# Patient Record
Sex: Female | Born: 1935 | Race: White | Hispanic: No | Smoking: Never smoker
Health system: Southern US, Community
[De-identification: ages and names within clinical notes are randomized; demographics above are authoritative.]

## PROBLEM LIST (undated history)

## (undated) DIAGNOSIS — M1990 Unspecified osteoarthritis, unspecified site: Secondary | ICD-10-CM

## (undated) DIAGNOSIS — I2510 Atherosclerotic heart disease of native coronary artery without angina pectoris: Secondary | ICD-10-CM

## (undated) DIAGNOSIS — I10 Essential (primary) hypertension: Secondary | ICD-10-CM

## (undated) DIAGNOSIS — I83009 Varicose veins of unspecified lower extremity with ulcer of unspecified site: Secondary | ICD-10-CM

## (undated) DIAGNOSIS — I4891 Unspecified atrial fibrillation: Secondary | ICD-10-CM

## (undated) DIAGNOSIS — L97909 Non-pressure chronic ulcer of unspecified part of unspecified lower leg with unspecified severity: Secondary | ICD-10-CM

## (undated) DIAGNOSIS — I5032 Chronic diastolic (congestive) heart failure: Secondary | ICD-10-CM

## (undated) DIAGNOSIS — E785 Hyperlipidemia, unspecified: Secondary | ICD-10-CM

## (undated) DIAGNOSIS — M8580 Other specified disorders of bone density and structure, unspecified site: Secondary | ICD-10-CM

## (undated) DIAGNOSIS — D696 Thrombocytopenia, unspecified: Secondary | ICD-10-CM

## (undated) HISTORY — DX: Chronic diastolic (congestive) heart failure: I50.32

## (undated) HISTORY — DX: Atherosclerotic heart disease of native coronary artery without angina pectoris: I25.10

## (undated) HISTORY — PX: EYE SURGERY: SHX253

## (undated) HISTORY — PX: HIP SURGERY: SHX245

## (undated) HISTORY — DX: Unspecified atrial fibrillation: I48.91

## (undated) HISTORY — PX: BACK SURGERY: SHX140

## (undated) HISTORY — PX: JOINT REPLACEMENT: SHX530

## (undated) HISTORY — PX: INSERTION / PLACEMENT / REVISION NEUROSTIMULATOR: SUR720

---

## 2003-04-12 ENCOUNTER — Encounter: Payer: Self-pay | Admitting: Geriatric Medicine

## 2003-04-12 ENCOUNTER — Encounter: Admission: RE | Admit: 2003-04-12 | Discharge: 2003-04-12 | Payer: Self-pay | Admitting: Geriatric Medicine

## 2003-04-16 ENCOUNTER — Encounter: Admission: RE | Admit: 2003-04-16 | Discharge: 2003-04-16 | Payer: Self-pay | Admitting: Geriatric Medicine

## 2003-04-16 ENCOUNTER — Encounter: Payer: Self-pay | Admitting: Geriatric Medicine

## 2003-11-03 ENCOUNTER — Encounter: Admission: RE | Admit: 2003-11-03 | Discharge: 2003-11-03 | Payer: Self-pay | Admitting: Geriatric Medicine

## 2003-12-07 ENCOUNTER — Ambulatory Visit (HOSPITAL_COMMUNITY): Admission: RE | Admit: 2003-12-07 | Discharge: 2003-12-07 | Payer: Self-pay | Admitting: Geriatric Medicine

## 2003-12-09 ENCOUNTER — Emergency Department (HOSPITAL_COMMUNITY): Admission: EM | Admit: 2003-12-09 | Discharge: 2003-12-09 | Payer: Self-pay | Admitting: Emergency Medicine

## 2004-01-12 ENCOUNTER — Ambulatory Visit (HOSPITAL_COMMUNITY): Admission: RE | Admit: 2004-01-12 | Discharge: 2004-01-12 | Payer: Self-pay | Admitting: Neurological Surgery

## 2004-02-01 ENCOUNTER — Inpatient Hospital Stay (HOSPITAL_COMMUNITY): Admission: RE | Admit: 2004-02-01 | Discharge: 2004-02-09 | Payer: Self-pay | Admitting: Neurological Surgery

## 2004-03-09 ENCOUNTER — Ambulatory Visit (HOSPITAL_COMMUNITY): Admission: RE | Admit: 2004-03-09 | Discharge: 2004-03-09 | Payer: Self-pay | Admitting: Neurological Surgery

## 2005-01-01 ENCOUNTER — Ambulatory Visit (HOSPITAL_COMMUNITY): Admission: RE | Admit: 2005-01-01 | Discharge: 2005-01-01 | Payer: Self-pay | Admitting: Geriatric Medicine

## 2005-01-22 ENCOUNTER — Encounter (HOSPITAL_BASED_OUTPATIENT_CLINIC_OR_DEPARTMENT_OTHER): Admission: RE | Admit: 2005-01-22 | Discharge: 2005-04-22 | Payer: Self-pay | Admitting: Surgery

## 2005-02-28 ENCOUNTER — Encounter: Admission: RE | Admit: 2005-02-28 | Discharge: 2005-02-28 | Payer: Self-pay | Admitting: Geriatric Medicine

## 2005-03-22 ENCOUNTER — Ambulatory Visit (HOSPITAL_COMMUNITY): Admission: RE | Admit: 2005-03-22 | Discharge: 2005-03-22 | Payer: Self-pay | Admitting: Neurological Surgery

## 2005-05-11 ENCOUNTER — Encounter (HOSPITAL_BASED_OUTPATIENT_CLINIC_OR_DEPARTMENT_OTHER): Admission: RE | Admit: 2005-05-11 | Discharge: 2005-08-09 | Payer: Self-pay | Admitting: Surgery

## 2005-05-29 ENCOUNTER — Other Ambulatory Visit: Admission: RE | Admit: 2005-05-29 | Discharge: 2005-05-29 | Payer: Self-pay | Admitting: Geriatric Medicine

## 2005-06-14 ENCOUNTER — Ambulatory Visit: Payer: Self-pay | Admitting: Hematology & Oncology

## 2005-12-13 ENCOUNTER — Ambulatory Visit: Payer: Self-pay | Admitting: Hematology & Oncology

## 2006-03-21 ENCOUNTER — Encounter: Admission: RE | Admit: 2006-03-21 | Discharge: 2006-03-21 | Payer: Self-pay | Admitting: Geriatric Medicine

## 2006-03-26 ENCOUNTER — Encounter: Payer: Self-pay | Admitting: Vascular Surgery

## 2006-03-26 ENCOUNTER — Ambulatory Visit (HOSPITAL_COMMUNITY): Admission: RE | Admit: 2006-03-26 | Discharge: 2006-03-26 | Payer: Self-pay | Admitting: Geriatric Medicine

## 2006-06-12 ENCOUNTER — Ambulatory Visit: Payer: Self-pay | Admitting: Hematology & Oncology

## 2006-06-14 LAB — CBC WITH DIFFERENTIAL/PLATELET
BASO%: 0.9 % (ref 0.0–2.0)
EOS%: 2.1 % (ref 0.0–7.0)
HCT: 41.8 % (ref 34.8–46.6)
LYMPH%: 22.5 % (ref 14.0–48.0)
MCH: 31.4 pg (ref 26.0–34.0)
MCHC: 35.7 g/dL (ref 32.0–36.0)
NEUT%: 69.6 % (ref 39.6–76.8)
Platelets: 45 10*3/uL — ABNORMAL LOW (ref 145–400)

## 2006-07-22 ENCOUNTER — Ambulatory Visit (HOSPITAL_COMMUNITY): Admission: RE | Admit: 2006-07-22 | Discharge: 2006-07-22 | Payer: Self-pay | Admitting: Neurological Surgery

## 2006-07-29 ENCOUNTER — Ambulatory Visit: Payer: Self-pay | Admitting: Hematology & Oncology

## 2006-07-29 LAB — CBC WITH DIFFERENTIAL/PLATELET
Basophils Absolute: 0 10*3/uL (ref 0.0–0.1)
Eosinophils Absolute: 0.3 10*3/uL (ref 0.0–0.5)
HGB: 16.2 g/dL — ABNORMAL HIGH (ref 11.6–15.9)
LYMPH%: 29.4 % (ref 14.0–48.0)
MONO#: 0.3 10*3/uL (ref 0.1–0.9)
NEUT#: 4.9 10*3/uL (ref 1.5–6.5)
Platelets: 72 10*3/uL — ABNORMAL LOW (ref 145–400)
RBC: 5.09 10*6/uL (ref 3.70–5.32)
WBC: 7.9 10*3/uL (ref 3.9–10.0)

## 2006-08-06 LAB — CBC WITH DIFFERENTIAL/PLATELET
Eosinophils Absolute: 0.8 10*3/uL — ABNORMAL HIGH (ref 0.0–0.5)
HCT: 44.5 % (ref 34.8–46.6)
LYMPH%: 27.4 % (ref 14.0–48.0)
MCHC: 34.4 g/dL (ref 32.0–36.0)
MONO#: 0.4 10*3/uL (ref 0.1–0.9)
NEUT#: 5.2 10*3/uL (ref 1.5–6.5)
NEUT%: 58.8 % (ref 39.6–76.8)
Platelets: 76 10*3/uL — ABNORMAL LOW (ref 145–400)
WBC: 8.9 10*3/uL (ref 3.9–10.0)

## 2006-08-20 LAB — CBC WITH DIFFERENTIAL/PLATELET
Basophils Absolute: 0 10*3/uL (ref 0.0–0.1)
EOS%: 11.4 % — ABNORMAL HIGH (ref 0.0–7.0)
Eosinophils Absolute: 1 10*3/uL — ABNORMAL HIGH (ref 0.0–0.5)
HGB: 15.1 g/dL (ref 11.6–15.9)
MONO#: 0.4 10*3/uL (ref 0.1–0.9)
NEUT#: 5.6 10*3/uL (ref 1.5–6.5)
RDW: 12.5 % (ref 11.3–14.5)
WBC: 8.8 10*3/uL (ref 3.9–10.0)
lymph#: 1.8 10*3/uL (ref 0.9–3.3)

## 2006-09-05 ENCOUNTER — Ambulatory Visit (HOSPITAL_COMMUNITY): Admission: RE | Admit: 2006-09-05 | Discharge: 2006-09-05 | Payer: Self-pay | Admitting: Neurological Surgery

## 2006-09-12 ENCOUNTER — Ambulatory Visit: Payer: Self-pay | Admitting: Hematology & Oncology

## 2006-09-23 ENCOUNTER — Ambulatory Visit (HOSPITAL_COMMUNITY): Admission: RE | Admit: 2006-09-23 | Discharge: 2006-09-25 | Payer: Self-pay | Admitting: Neurological Surgery

## 2006-12-10 ENCOUNTER — Ambulatory Visit: Payer: Self-pay | Admitting: Hematology & Oncology

## 2006-12-13 LAB — CBC WITH DIFFERENTIAL/PLATELET
Basophils Absolute: 0.1 10*3/uL (ref 0.0–0.1)
EOS%: 5.2 % (ref 0.0–7.0)
MCH: 31.5 pg (ref 26.0–34.0)
MCV: 89.2 fL (ref 81.0–101.0)
MONO%: 5.5 % (ref 0.0–13.0)
RBC: 4.98 10*6/uL (ref 3.70–5.32)
RDW: 12.2 % (ref 11.3–14.5)

## 2006-12-13 LAB — CHCC SMEAR

## 2007-02-12 ENCOUNTER — Encounter: Admission: RE | Admit: 2007-02-12 | Discharge: 2007-02-12 | Payer: Self-pay | Admitting: Geriatric Medicine

## 2007-03-28 ENCOUNTER — Inpatient Hospital Stay (HOSPITAL_COMMUNITY): Admission: RE | Admit: 2007-03-28 | Discharge: 2007-04-02 | Payer: Self-pay | Admitting: Orthopedic Surgery

## 2007-04-02 ENCOUNTER — Ambulatory Visit: Payer: Self-pay | Admitting: Vascular Surgery

## 2007-06-06 ENCOUNTER — Encounter: Admission: RE | Admit: 2007-06-06 | Discharge: 2007-06-06 | Payer: Self-pay | Admitting: Geriatric Medicine

## 2007-06-06 ENCOUNTER — Encounter (HOSPITAL_BASED_OUTPATIENT_CLINIC_OR_DEPARTMENT_OTHER): Admission: RE | Admit: 2007-06-06 | Discharge: 2007-06-17 | Payer: Self-pay | Admitting: Surgery

## 2007-06-20 ENCOUNTER — Encounter (HOSPITAL_BASED_OUTPATIENT_CLINIC_OR_DEPARTMENT_OTHER): Admission: RE | Admit: 2007-06-20 | Discharge: 2007-07-03 | Payer: Self-pay | Admitting: Surgery

## 2007-07-27 ENCOUNTER — Ambulatory Visit: Payer: Self-pay | Admitting: *Deleted

## 2007-07-27 ENCOUNTER — Encounter (INDEPENDENT_AMBULATORY_CARE_PROVIDER_SITE_OTHER): Payer: Self-pay | Admitting: Emergency Medicine

## 2007-07-27 ENCOUNTER — Inpatient Hospital Stay (HOSPITAL_COMMUNITY): Admission: EM | Admit: 2007-07-27 | Discharge: 2007-08-06 | Payer: Self-pay | Admitting: Emergency Medicine

## 2008-03-31 ENCOUNTER — Other Ambulatory Visit: Admission: RE | Admit: 2008-03-31 | Discharge: 2008-03-31 | Payer: Self-pay | Admitting: Geriatric Medicine

## 2008-06-07 ENCOUNTER — Encounter: Admission: RE | Admit: 2008-06-07 | Discharge: 2008-06-07 | Payer: Self-pay | Admitting: Geriatric Medicine

## 2008-07-06 ENCOUNTER — Ambulatory Visit (HOSPITAL_COMMUNITY): Admission: RE | Admit: 2008-07-06 | Discharge: 2008-07-06 | Payer: Self-pay | Admitting: Urology

## 2008-07-06 ENCOUNTER — Encounter (INDEPENDENT_AMBULATORY_CARE_PROVIDER_SITE_OTHER): Payer: Self-pay | Admitting: Geriatric Medicine

## 2009-01-17 ENCOUNTER — Ambulatory Visit (HOSPITAL_COMMUNITY): Admission: RE | Admit: 2009-01-17 | Discharge: 2009-01-17 | Payer: Self-pay | Admitting: Geriatric Medicine

## 2009-01-17 ENCOUNTER — Encounter (INDEPENDENT_AMBULATORY_CARE_PROVIDER_SITE_OTHER): Payer: Self-pay | Admitting: Geriatric Medicine

## 2009-01-17 ENCOUNTER — Ambulatory Visit: Payer: Self-pay | Admitting: Vascular Surgery

## 2009-01-18 ENCOUNTER — Encounter: Admission: RE | Admit: 2009-01-18 | Discharge: 2009-01-18 | Payer: Self-pay | Admitting: Geriatric Medicine

## 2009-01-19 ENCOUNTER — Ambulatory Visit: Payer: Self-pay | Admitting: Hematology & Oncology

## 2009-02-04 ENCOUNTER — Ambulatory Visit: Payer: Self-pay | Admitting: Vascular Surgery

## 2009-02-04 ENCOUNTER — Ambulatory Visit (HOSPITAL_COMMUNITY): Admission: RE | Admit: 2009-02-04 | Discharge: 2009-02-04 | Payer: Self-pay | Admitting: Geriatric Medicine

## 2009-02-04 ENCOUNTER — Encounter (INDEPENDENT_AMBULATORY_CARE_PROVIDER_SITE_OTHER): Payer: Self-pay | Admitting: Geriatric Medicine

## 2009-02-09 ENCOUNTER — Encounter: Admission: RE | Admit: 2009-02-09 | Discharge: 2009-02-09 | Payer: Self-pay | Admitting: Geriatric Medicine

## 2009-09-02 ENCOUNTER — Ambulatory Visit: Payer: Self-pay | Admitting: Hematology & Oncology

## 2009-09-05 LAB — CBC WITH DIFFERENTIAL (CANCER CENTER ONLY)
BASO%: 0.5 % (ref 0.0–2.0)
EOS%: 7.2 % — ABNORMAL HIGH (ref 0.0–7.0)
LYMPH%: 24.7 % (ref 14.0–48.0)
MCHC: 34.8 g/dL (ref 32.0–36.0)
MCV: 92 fL (ref 81–101)
MONO#: 0.3 10*3/uL (ref 0.1–0.9)
Platelets: 37 10*3/uL — ABNORMAL LOW (ref 145–400)
RDW: 11.9 % (ref 10.5–14.6)
WBC: 6.5 10*3/uL (ref 3.9–10.0)

## 2009-09-05 LAB — CHCC SATELLITE - SMEAR

## 2009-09-23 ENCOUNTER — Encounter: Admission: RE | Admit: 2009-09-23 | Discharge: 2009-09-23 | Payer: Self-pay | Admitting: Geriatric Medicine

## 2009-10-11 ENCOUNTER — Encounter (HOSPITAL_BASED_OUTPATIENT_CLINIC_OR_DEPARTMENT_OTHER)
Admission: RE | Admit: 2009-10-11 | Discharge: 2009-11-22 | Payer: Self-pay | Source: Home / Self Care | Admitting: Internal Medicine

## 2009-10-20 ENCOUNTER — Ambulatory Visit: Payer: Self-pay | Admitting: Hematology & Oncology

## 2009-10-21 LAB — CBC WITH DIFFERENTIAL (CANCER CENTER ONLY)
BASO#: 0 10*3/uL (ref 0.0–0.2)
BASO%: 0.5 % (ref 0.0–2.0)
EOS%: 4.5 % (ref 0.0–7.0)
HCT: 43.8 % (ref 34.8–46.6)
HGB: 14.7 g/dL (ref 11.6–15.9)
LYMPH%: 18.7 % (ref 14.0–48.0)
MCHC: 33.5 g/dL (ref 32.0–36.0)
MCV: 92 fL (ref 81–101)
NEUT%: 71.8 % (ref 39.6–80.0)
RBC: 4.74 10*6/uL (ref 3.70–5.32)

## 2009-10-21 LAB — TECHNOLOGIST REVIEW CHCC SATELLITE

## 2009-10-21 LAB — CHCC SATELLITE - SMEAR

## 2009-11-09 ENCOUNTER — Encounter: Admission: RE | Admit: 2009-11-09 | Discharge: 2009-11-09 | Payer: Self-pay | Admitting: Geriatric Medicine

## 2010-02-16 ENCOUNTER — Ambulatory Visit: Payer: Self-pay | Admitting: Hematology & Oncology

## 2010-02-17 LAB — CBC WITH DIFFERENTIAL (CANCER CENTER ONLY)
BASO%: 0.6 % (ref 0.0–2.0)
HCT: 40.1 % (ref 34.8–46.6)
HGB: 13.4 g/dL (ref 11.6–15.9)
LYMPH#: 2.3 10*3/uL (ref 0.9–3.3)
LYMPH%: 31.8 % (ref 14.0–48.0)
MCH: 31 pg (ref 26.0–34.0)
MCHC: 33.6 g/dL (ref 32.0–36.0)
NEUT#: 4.3 10*3/uL (ref 1.5–6.5)
RBC: 4.33 10*6/uL (ref 3.70–5.32)

## 2010-02-17 LAB — CHCC SATELLITE - SMEAR

## 2010-05-25 ENCOUNTER — Ambulatory Visit: Payer: Self-pay | Admitting: Hematology & Oncology

## 2010-05-26 LAB — CBC WITH DIFFERENTIAL (CANCER CENTER ONLY)
BASO#: 0 10*3/uL (ref 0.0–0.2)
MCHC: 33.2 g/dL (ref 32.0–36.0)
MONO#: 0.3 10*3/uL (ref 0.1–0.9)
NEUT#: 4.3 10*3/uL (ref 1.5–6.5)
Platelets: 56 10*3/uL — ABNORMAL LOW (ref 145–400)
RBC: 4.66 10*6/uL (ref 3.70–5.32)
RDW: 11 % (ref 10.5–14.6)

## 2010-05-26 LAB — CHCC SATELLITE - SMEAR

## 2010-07-12 ENCOUNTER — Ambulatory Visit: Payer: Self-pay | Admitting: Hematology & Oncology

## 2010-10-03 ENCOUNTER — Encounter
Admission: RE | Admit: 2010-10-03 | Discharge: 2010-10-03 | Payer: Self-pay | Source: Home / Self Care | Attending: Hematology & Oncology | Admitting: Hematology & Oncology

## 2010-10-18 ENCOUNTER — Encounter: Payer: Self-pay | Admitting: Hematology & Oncology

## 2010-11-23 ENCOUNTER — Encounter (HOSPITAL_BASED_OUTPATIENT_CLINIC_OR_DEPARTMENT_OTHER): Payer: Medicare Other | Admitting: Hematology & Oncology

## 2010-11-23 ENCOUNTER — Other Ambulatory Visit: Payer: Self-pay | Admitting: Hematology & Oncology

## 2010-11-23 DIAGNOSIS — D693 Immune thrombocytopenic purpura: Secondary | ICD-10-CM

## 2010-11-23 LAB — CHCC SATELLITE - SMEAR

## 2010-11-23 LAB — CBC WITH DIFFERENTIAL (CANCER CENTER ONLY)
BASO%: 0.1 % (ref 0.0–2.0)
LYMPH%: 21.4 % (ref 14.0–48.0)
MCH: 30.7 pg (ref 26.0–34.0)
MCV: 90 fL (ref 81–101)
MONO#: 0.5 10*3/uL (ref 0.1–0.9)
NEUT%: 67.3 % (ref 39.6–80.0)
Platelets: 44 10*3/uL — ABNORMAL LOW (ref 145–400)
RBC: 4.69 10*6/uL (ref 3.70–5.32)
RDW: 13.2 % (ref 11.1–15.7)

## 2010-12-29 ENCOUNTER — Other Ambulatory Visit: Payer: Self-pay | Admitting: Hematology & Oncology

## 2010-12-29 ENCOUNTER — Ambulatory Visit (HOSPITAL_BASED_OUTPATIENT_CLINIC_OR_DEPARTMENT_OTHER): Admission: RE | Admit: 2010-12-29 | Payer: Medicare Other | Source: Ambulatory Visit | Admitting: Ophthalmology

## 2010-12-29 ENCOUNTER — Encounter (HOSPITAL_BASED_OUTPATIENT_CLINIC_OR_DEPARTMENT_OTHER): Payer: Medicare Other | Admitting: Hematology & Oncology

## 2010-12-29 DIAGNOSIS — D693 Immune thrombocytopenic purpura: Secondary | ICD-10-CM

## 2010-12-29 LAB — CBC WITH DIFFERENTIAL (CANCER CENTER ONLY)
BASO#: 0 10*3/uL (ref 0.0–0.2)
HGB: 13.9 g/dL (ref 11.6–15.9)
LYMPH#: 1.6 10*3/uL (ref 0.9–3.3)
LYMPH%: 19.4 % (ref 14.0–48.0)
NEUT%: 68 % (ref 39.6–80.0)

## 2011-01-05 ENCOUNTER — Encounter (HOSPITAL_BASED_OUTPATIENT_CLINIC_OR_DEPARTMENT_OTHER): Payer: Medicare Other | Admitting: Hematology & Oncology

## 2011-01-05 ENCOUNTER — Other Ambulatory Visit: Payer: Self-pay | Admitting: Hematology & Oncology

## 2011-01-05 DIAGNOSIS — D696 Thrombocytopenia, unspecified: Secondary | ICD-10-CM

## 2011-01-05 DIAGNOSIS — M545 Low back pain: Secondary | ICD-10-CM

## 2011-01-05 DIAGNOSIS — D693 Immune thrombocytopenic purpura: Secondary | ICD-10-CM

## 2011-01-05 LAB — CBC WITH DIFFERENTIAL (CANCER CENTER ONLY)
BASO#: 0.1 10*3/uL (ref 0.0–0.2)
Eosinophils Absolute: 0.6 10*3/uL — ABNORMAL HIGH (ref 0.0–0.5)
HGB: 14.2 g/dL (ref 11.6–15.9)
LYMPH%: 20.8 % (ref 14.0–48.0)
MCH: 30.9 pg (ref 26.0–34.0)
MONO#: 0.4 10*3/uL (ref 0.1–0.9)
MONO%: 5.4 % (ref 0.0–13.0)
NEUT#: 5.4 10*3/uL (ref 1.5–6.5)
NEUT%: 65.4 % (ref 39.6–80.0)
RBC: 4.59 10*6/uL (ref 3.70–5.32)
WBC: 8.2 10*3/uL (ref 3.9–10.0)

## 2011-01-12 ENCOUNTER — Encounter (HOSPITAL_BASED_OUTPATIENT_CLINIC_OR_DEPARTMENT_OTHER): Payer: Medicare Other | Admitting: Hematology & Oncology

## 2011-01-12 ENCOUNTER — Other Ambulatory Visit: Payer: Self-pay | Admitting: Hematology & Oncology

## 2011-01-12 DIAGNOSIS — D693 Immune thrombocytopenic purpura: Secondary | ICD-10-CM

## 2011-01-12 LAB — CBC WITH DIFFERENTIAL (CANCER CENTER ONLY)
BASO#: 0.1 10*3/uL (ref 0.0–0.2)
BASO%: 0.6 % (ref 0.0–2.0)
EOS%: 6.2 % (ref 0.0–7.0)
HCT: 43.2 % (ref 34.8–46.6)
HGB: 15 g/dL (ref 11.6–15.9)
LYMPH%: 30.4 % (ref 14.0–48.0)
MCV: 89 fL (ref 81–101)
MONO#: 0.6 10*3/uL (ref 0.1–0.9)
NEUT#: 5.5 10*3/uL (ref 1.5–6.5)
NEUT%: 56.5 % (ref 39.6–80.0)
RBC: 4.87 10*6/uL (ref 3.70–5.32)
RDW: 13.2 % (ref 11.1–15.7)

## 2011-01-12 LAB — CHCC SATELLITE - SMEAR

## 2011-01-19 ENCOUNTER — Encounter (HOSPITAL_BASED_OUTPATIENT_CLINIC_OR_DEPARTMENT_OTHER): Payer: Medicare Other | Admitting: Hematology & Oncology

## 2011-01-19 ENCOUNTER — Other Ambulatory Visit: Payer: Self-pay | Admitting: Hematology & Oncology

## 2011-01-19 DIAGNOSIS — D693 Immune thrombocytopenic purpura: Secondary | ICD-10-CM

## 2011-01-19 LAB — CBC WITH DIFFERENTIAL (CANCER CENTER ONLY)
BASO#: 0 10*3/uL (ref 0.0–0.2)
HGB: 13.9 g/dL (ref 11.6–15.9)
LYMPH%: 19.6 % (ref 14.0–48.0)
MONO%: 5.6 % (ref 0.0–13.0)
RBC: 4.53 10*6/uL (ref 3.70–5.32)
WBC: 8.5 10*3/uL (ref 3.9–10.0)

## 2011-01-26 ENCOUNTER — Encounter (HOSPITAL_BASED_OUTPATIENT_CLINIC_OR_DEPARTMENT_OTHER): Payer: Medicare Other | Admitting: Hematology & Oncology

## 2011-01-26 ENCOUNTER — Other Ambulatory Visit: Payer: Self-pay | Admitting: Hematology & Oncology

## 2011-01-26 DIAGNOSIS — D693 Immune thrombocytopenic purpura: Secondary | ICD-10-CM

## 2011-01-26 DIAGNOSIS — D696 Thrombocytopenia, unspecified: Secondary | ICD-10-CM

## 2011-01-26 LAB — CBC WITH DIFFERENTIAL (CANCER CENTER ONLY)
BASO#: 0 10*3/uL (ref 0.0–0.2)
Eosinophils Absolute: 0.4 10*3/uL (ref 0.0–0.5)
LYMPH#: 1.3 10*3/uL (ref 0.9–3.3)
LYMPH%: 14.8 % (ref 14.0–48.0)
MCH: 30.8 pg (ref 26.0–34.0)
MONO#: 0.4 10*3/uL (ref 0.1–0.9)
MONO%: 4.4 % (ref 0.0–13.0)
Platelets: 87 10*3/uL — ABNORMAL LOW (ref 145–400)

## 2011-01-29 ENCOUNTER — Encounter (HOSPITAL_BASED_OUTPATIENT_CLINIC_OR_DEPARTMENT_OTHER): Payer: Medicare Other | Attending: Internal Medicine

## 2011-01-29 DIAGNOSIS — Z79899 Other long term (current) drug therapy: Secondary | ICD-10-CM | POA: Insufficient documentation

## 2011-01-29 DIAGNOSIS — L97809 Non-pressure chronic ulcer of other part of unspecified lower leg with unspecified severity: Secondary | ICD-10-CM | POA: Insufficient documentation

## 2011-01-29 DIAGNOSIS — Z7982 Long term (current) use of aspirin: Secondary | ICD-10-CM | POA: Insufficient documentation

## 2011-01-29 DIAGNOSIS — I872 Venous insufficiency (chronic) (peripheral): Secondary | ICD-10-CM | POA: Insufficient documentation

## 2011-01-29 DIAGNOSIS — L97309 Non-pressure chronic ulcer of unspecified ankle with unspecified severity: Secondary | ICD-10-CM | POA: Insufficient documentation

## 2011-01-29 DIAGNOSIS — D696 Thrombocytopenia, unspecified: Secondary | ICD-10-CM | POA: Insufficient documentation

## 2011-01-29 DIAGNOSIS — Z96649 Presence of unspecified artificial hip joint: Secondary | ICD-10-CM | POA: Insufficient documentation

## 2011-01-29 DIAGNOSIS — M899 Disorder of bone, unspecified: Secondary | ICD-10-CM | POA: Insufficient documentation

## 2011-01-30 NOTE — Assessment & Plan Note (Signed)
[facility]   NAMECHAKA, BOYSON              ACCOUNT NO.:  [id number]   MEDICAL RECORD NO.:  [id number]      DATE OF BIRTH:  29-Mar-1931   PHYSICIAN:  Epifania Gore. Nils Pyle, M.D. VISIT DATE:  06/24/2007                                   OFFICE VISIT   SUBJECTIVE:  Ms. Avants is a 77 year old lady who we have followed for  bilateral stasis with ulceration.  In the interim, we placed her and  compression wrap.  There has been no excessive drainage, malodor, pain  or fever.  She returns for follow-up.   OBJECTIVE:  Blood pressure is 133/64, respirations of 20, pulse rate 64,  temperature is 98.0. Inspection of the lower extremity shows a  persistence of 2 to 3+ edema, but there is no evidence of ulceration.  Capillary refill is brisk.  There is no evidence of infection or  ischemia.   ASSESSMENT:  Resolved stasis ulcer.   PLAN:  We are discharging the patient from the [facility] and  recommending that she procure and wear bilateral below-the-knee open toe  20-30 mm compression hose.  The patient does not have these stockings in  her possession at present, but she indicates that she will be able to  procure them in the near future.  We are discharging her from the  clinic.  We will see her on a p.r.n. basis.      Harold A. Nils Pyle, M.D.  Electronically Signed     HAN/MEDQ  D:  06/24/2007  T:  06/24/2007  Job:  [id number]   cc:   Hal T. Stoneking, M.D.

## 2011-01-30 NOTE — Discharge Summary (Signed)
[name] [name], [name] [name]              ACCOUNT NO.:  [id number]   MEDICAL RECORD NO.:  [id number]          PATIENT TYPE:  INP   LOCATION:  5009                         FACILITY:  [facility]   PHYSICIAN:  Lockie Pares, M.D.    DATE OF BIRTH:  27-Mar-1931   DATE OF ADMISSION:  03/28/2007  DATE OF DISCHARGE:  04/02/2007                               DISCHARGE SUMMARY   ADMITTING DIAGNOSIS:  Osteoarthritis of the right hip.   DISCHARGE DIAGNOSES:  1. Osteoarthritis of the right hip.  2. History of chronic idiopathic thrombocytopenia.  3. Hypertension.  4. Osteopenia.  5. Chronic low back pain.  6. Anxiety/depression.   PROCEDURE:  Right total hip arthroplasty.   HISTORY:  Patient is a 77 year old white female, who presented to Dr.  French Ana with a two-month history of onset of right hip pain.  She  reports the pain started after she had cleaned out an extra room and she  has had no injury or prior surgery to the hip.  At the point of  evaluation, the pain was in the right hip, which was an intermittent  hurt in the trochanteric region without radiation.  It was worse with  certain movements and range of motion, then decreased with OxyContin and  Percocet.  She could not sleep on the right side.  She has had great  difficulty putting her socks on and has been using a rolling walker for  the last two years.  She does have chronic low back pain and lower  extremity pain for which she has an implanted nerve stimulator.  She is  admitted at this time with end-stage bilateral osteoarthritis, right  greater than left, for right total hip arthroplasty.   HOSPITAL COURSE:  Patient is a 77 year old female, admitted March 27, 2006.  After appropriate laboratory studies were obtained, she was taken  to the operating room, where she underwent a right total hip  arthroplasty with a DePuy Prodigy 15 mm small-stature stem with a +1.5  neck length, a 36 mm hip ball and a 58 mm acetabulum by Dr. Flossie Dibble.  The assistant was Benita Stabile, P.A.-C.  She had tolerated the procedure  well.  She was placed on Cleocin 600 mg IV preoperatively and q. 8 hours  times three doses postoperatively.  A reduced-dose Dilaudid PCA pump was  used for pain management.  Routine diet.  A Foley was placed  intraoperatively.  Consultations with PT and OT were made.  Ambulating  weightbearing as tolerated on the right.  She was allowed out of bed to  a chair the following day with physical therapy.  Aggressive pulmonary  toilet was ordered.  She was noted to have a UTI and was placed on  Septra-DS one p.o. daily for three days.  For her ITP, she did have  platelets preoperatively and intraoperatively.  On July 13, a chest x-  ray was ordered because of increased white count and temperature.  On  July 14, she was noted to have a decrease in her hemoglobin and it was  down to 7.1 with hematocrit of  20.3%.  She was typed and crossed two  units of packed cells and these were transfused over two hours.  She was  also given 10 units of platelets between the two units of blood.  Lovenox had been initially used as an anticoagulant, but this was  discontinued on July 14.  Her chest x-ray on July 13 revealed minimal  left lower lobe atelectasis, no evidence of acute cardiopulmonary  disease.  Her Foley was discontinued on July 15.  PT worked with her  several days, trying to get her ambulatory.  Dopplers were performed on  July 16, which revealed no evidence of DVT.  On July 15, she was given  40 mEq of K-Dur b.i.d., which then took her potassium from 3.1 to 3.5.  She has done well, considering, and it is felt that she is now indicated  for discharge to the [facility].   LABORATORY DATA:  Admitting hemoglobin 14.8, hematocrit 43.4%, white  count 9400, platelets 56,000.  Discharge hemoglobin 9.2, hematocrit  26.7%, white count 9300, platelets 147,000.  Pro-time was 14.7, INR 1.1,  PTT was 35.  Chemistries  preoperatively:  Sodium 135, potassium 3.4,  chloride 96, CO2 32, glucose 91, BUN 21, creatinine 1.17.  GFR was 45.  Total protein 7.3, albumin 3.7, AST 18, ALT 14, ALP 98, and total  bilirubin 0.5.  Discharge potassium was 3.5 and previous sodium was 138,  potassium today was 3.5, chloride 100, CO2 29, glucose 119, BUN 12,  creatinine 0.98.  Urinalysis from March 26, 2007, revealed positive  nitrites with large leukocyte esterase with rare epitheliums, white  cells 21-50, red cells 0-2 and many bacteria.  Urine culture of July 9  revealed greater than 100,000 colonies of e. coli, sensitive to  ampicillin, ceftriaxone, cefazolin, gentamicin, nitrofurantoin,  tobramycin and Septra.  She received three units of packed cells during  her hospital course and approximately 4 units of platelets.  No EKG was  noted at the time of this dictation.  Chest x-ray of July 13 revealed  minimal left lower lobe atelectasis with no evidence of acute pulmonary  disease.  Right hip postoperatively revealed total hip arthroplasty with  the bones well-positioned, no radiographically detectable abnormalities  or detectable condition.   DISCHARGE INSTRUCTIONS:  She is allowed to have a regular diet.  She may  walk with a walker and with assistance until safe.  No lifting or  driving for six weeks.  A sterile dressing should be placed to the wound  at least twice a day until drainage has stopped.  She may shower once  the drainage has stopped times one day.  Ambulate full weightbearing,  using her walker.  Knee immobilizer is to be used when in bed to prevent  dislocation of the hip.  Posterior hip dislocation protocol.   MEDICATIONS INCLUDED:  1. Gabapentin 800 mg q.i.d.  2. Percocet 5/325 one to two tabs every 4 hours as needed for pain.  3. Lyrica 75 mg t.i.d.  4. Metoprolol 25 mg daily.  5. Furosemide 40 mg b.i.d.  6. Lovastatin 10 mg daily.  7. Klor-Con 8 mEq daily.  8. Lisinopril 40 mg in the morning,  20 mg in the evening.  9. Geophysicist/field seismologist one daily.  10.Stool softeners daily p.r.n.  11.B12 1000 mg daily.  12.Calcium daily.  13.Alprazolam 0.5 mg p.o. t.i.d. p.r.n.   She will follow back up with Dr. French Ana at two weeks postop, on July  24.  She will need  to call for an appointment at that time.  She was  discharged in improved condition.  She can use her TED hose, on during  the day, off at night-time.     Mike Craze [place], P.A.-C.      Lockie Pares, M.D.  Electronically Signed   BDP/MEDQ  D:  04/02/2007  T:  04/02/2007  Job:  [id number]

## 2011-01-30 NOTE — Discharge Summary (Signed)
[name] [name], [name] [name]              ACCOUNT NO.:  [id number]   MEDICAL RECORD NO.:  [id number]          PATIENT TYPE:  INP   LOCATION:  5009                         FACILITY:  [facility]   PHYSICIAN:  Lockie Pares, M.D.    DATE OF BIRTH:  [birth year]   DATE OF ADMISSION:  03/28/2007  DATE OF DISCHARGE:  04/02/2007                               DISCHARGE SUMMARY   ADDENDUM:   ADDITIONAL MEDICATIONS:  1. Tramadol 50 mg t.i.d. p.r.n.  2. Oxycodone CR 20 mg p.o. q.8-12h. p.r.n. pain.      Mike Craze [place], P.A.-C.      Lockie Pares, M.D.  Electronically Signed    BDP/MEDQ  D:  04/02/2007  T:  04/02/2007  Job:  [id number]

## 2011-01-30 NOTE — Op Note (Signed)
NAME:  [name] [name], [name] [name]              ACCOUNT NO.:  [id number]   MEDICAL RECORD NO.:  [id number]          PATIENT TYPE:  INP   LOCATION:  2550                         FACILITY:  [facility]   PHYSICIAN:  Lockie Pares, M.D.    DATE OF BIRTH:  1931-09-11   DATE OF PROCEDURE:  03/28/2007  DATE OF DISCHARGE:                               OPERATIVE REPORT   PREOPERATIVE DIAGNOSES:  1. Severe osteoarthritis, right hip.  2. Morbid obesity.  3. Idiopathic thrombocytopenia purpura.   POSTOPERATIVE DIAGNOSES:  1. Severe osteoarthritis, right hip.  2. Morbid obesity.  3. Idiopathic thrombocytopenia purpura.   OPERATION:  Right total hip replacement (DePuy Prodigy 15-mm small-  stature stem with a +1.5-mm neck length, 36-mm hip ball and a 58-mm  acetabulum).   SURGEON:  Lockie Pares, M.D.   ASSISTANTCarlis Abbott, P.A.   BLOOD LOSS:  1000.   NOTE:  Difficult primary hip, primarily due to the patient's large  stature, BMI greater than 40, with additional blood loss noted by and  large due to the fact that she had had ITP.  Per hematology's advice,  she was transfused 10 units of plates immediately preoperatively, but  blood loss was thought to be secondary primarily to abnormality platelet  function and a low platelet count, although she had no undue  complications during the surgery other than the increased blood loss.   DESCRIPTION OF PROCEDURE:  She was approached through a curvilinear  incision in a posterior approach to the hip with splitting of the  iliotibial band, gluteus maximus fascia, splitting of the short external  rotators.  T-capsulotomy was also made.  Careful protection of the  sciatic nerve.  The knee was kept in flexion as well as to prevent any  undue traction on the nerve.  The femoral head was cut about 1 1/2, and  then a 1 fingerbreadth provisional cut made, and then once the broach  had been placed, there was still excessive tightness relative to the  reduction;  therefore, a secondary cut was made about 1 fingerbreadth  above the lesser troch.  She had significant osteoporosis as well.  A  hand reamer was used to broach the canal.  I was quite careful broaching  the canal.  Initially, the hand broach went out through a small  perforation in the lateral trochanter.  For this reason, once final  broaching was accomplished, intraoperative x-ray showed the trial broach  to be well contained with no evidence of any obvious fracture.   She was progressively broached up to a 14.5-mm diameter for a 15-mm  stem, with the trial broach being placed.  This was followed by exposure  of the acetabulum, which was obviously degenerative.  She was reamed up  to a 57 for a 58-mm cup with the trial cup not bottoming out.  This was  followed by placement of the final cup with about 45 degrees of  abduction and 15 to 20 degrees of anteversion.  The trial liner was next  placed, followed by trial reduction on the femoral broach side.  Again,  excellent  stability was seen with the +5-mm neck length, with good  restoration of leg lengths.  No instability noted.  The final components  were next inserted after an apex screw was inserted, when the trial  acetabular liner was removed.  And the final components were inserted  with no undue complications after the prosthesis was inserted as well.  The wound was closed with interrupted Ethibond on the T-capsulotomy, #1  Ethibond on the iliotibial band, and 0 and 2-0 Vicryl in the  subcutaneous tissues.  The skin was closed using the stapling device.  Marcaine with epinephrine infiltrated the wound.  A lightly compressive  sterile dressing applied.  Taken to the recovery room in stable  condition.      Lockie Pares, M.D.  Electronically Signed     WDC/MEDQ  D:  03/28/2007  T:  03/29/2007  Job:  [id number]

## 2011-01-30 NOTE — Consult Note (Signed)
[name] [name], [name] [name]              ACCOUNT NO.:  [id number]   MEDICAL RECORD NO.:  [id number]          PATIENT TYPE:  REC   LOCATION:  FOOT                         FACILITY:  [facility]   PHYSICIAN:  Orlando Penner. Sevier, M.D. DATE OF BIRTH:  1931-02-28   DATE OF CONSULTATION:  06/10/2007  DATE OF DISCHARGE:                                 CONSULTATION   HISTORY:  This 77 year old white female is previously known to this  clinic, having been managed here 2 years ago with a large stasis  ulceration on the lower extremity.   She has recently been confined to the nursing center of the [facility] and has generally done well.  She had noted some  recent to increase in fluid retention and actually has had some  adjustment in her diuretic medication by her physician.  Unfortunately,  before significant diuresis could be achieved she developed a breakdown  on the medial aspect of her distal right lower extremity in the  supramalleolar or gaiter area.  She is referred here now for our  evaluation and advice of this lesion.   Her interim history has been largely uncomplicated and is recorded in  the chart in some detail, not to be repeated here.   EXAMINATION:  Blood pressure 127/70, pulse 59 and regular, respirations  18, temperature 98.7.  Both extremities are tensely edematous, at least to the knees if not  slightly above, and they are slightly pink and warm.  The left lower  extremity has no open lesions.  On the right lower extremity in the medial supramalleolar area is a  superficial ulceration measuring 1.1 x 1.0 x 0.1 cm.  Its base is  reasonably clean.  There is no evidence of surrounding infection.  There  is no odor and minimal drainage.   IMPRESSION:  Recurrent venous stasis ulceration, right lower extremity.   DISPOSITION:  1. The wound does not require debridement today.  2. It is treated with an application of a Silverlon pad and the wound      is then placed  in an Unna wrap to the knee.   After discussion with the patient, the left lower extremity is placed in  an Unna wrap as well, this being largely on a preventive basis at this  point.   Request is sent to the referring facility to continue to work with the  patient's primary doctor in hopes of better managing her fluid balance  and keeping this edema down.  Meanwhile, they are to assist her in  elevating her legs as much as possible.  There are to leave the wraps in  place unchanged provided they are well-tolerated.   Follow-up visit will be here in 1 week.           ______________________________  Orlando Penner. London Pepper, M.D.     RES/MEDQ  D:  06/10/2007  T:  06/11/2007  Job:  [id number]   cc:   [facility]

## 2011-01-30 NOTE — Assessment & Plan Note (Signed)
[facility]  NAMEGUSTAVIA, CARIE              ACCOUNT NO.:  [id number]  MEDICAL RECORD NO.:  [id number]      DATE OF BIRTH:  [birth year]  PHYSICIAN:  Orlando Penner. Gradie Butrick, M.D.  VISIT DATE:  01/29/2011                                  OFFICE VISIT   HISTORY:  This 77 year old white female who was previously been a patient in this clinic is here for evaluation of apparent stasis ulcerations on both distal lower extremities.  She has come here on referral from Dermatology after having been seen there and where she has been treated with mupirocin ointment, nonstick pads and a Coban wrap covered with an Ace wrap.  She apparently is a patient in [facility], although they sent no information nor request for details.  According to the patient, these lesions appeared originally as blisters and that she open them and then left a partial-thickness ulceration. She reports that she has some better since her treatment by Dermatology.  PAST MEDICAL HISTORY:  Surgeries; lumbar disk in 2005, neurostimulator implantation in 2007, right total hip in 2008, bilateral blepharoplasty in 2009.  Other hospitalizations none known.  REGULAR MEDICATIONS: 1. Questran 4 g daily. 2. Florastor 250 mg b.i.d. 3. Gabapentin 800 mg q.i.d. 4. Oxycodone 15 mg every 4 hours as needed. 5. MS Contin 100 mg every 12 hours. 6. Aspirin 81 mg daily. 7. Lasix 160 mg one time daily. 8. Klor-Con 60 mEq daily. 9. Metoprolol 25 mg two times daily. 86.VHQIONG, uncertain dose daily. 11.Simethicone as needed. 12.Lovastatin 10 mg daily. 13.Triamcinolone cream 0.1% which apparently been given her by the     dermatologist.  FAMILY HISTORY:  Not reviewed in detail and the patient is not adequate historian to give [place] any detail on that at the present.  SOCIAL HISTORY:  She is divorced, currently in the [place].  Denies use of tobacco, alcohol, or recreational drugs.  Had previously  been employed at [employer] no longer, so employed.  REVIEW OF SYSTEMS:  The patient has history of thrombocytopenia, which is apparently is a chronic problem, has chronic lower back pain, osteopenia, chronic pain syndrome, urinary incontinence, and some persisting visual difficulties despite earlier cataract surgeries.  PHYSICAL EXAMINATION:  VITAL SIGNS:  Blood pressure is 112/70, pulse 69, respirations 16, temperature 98.8. GENERAL:  She is an alert, cooperative, but not entirely reliable historian, but in no immediate distress. SKIN:  Her color is good.  Her skin turgor is good. HEENT:  Her mucous membranes are pink. NECK:  She has no carotid bruits. CHEST:  Her chest is grossly clear. CARDIOVASCULAR:  Her heart tones are regular with definite S4, but no other murmur, no gallop. ABDOMEN:  Her abdomen is protuberant, soft without organomegaly, masses or tenderness. EXTREMITIES:  Show chronic stasis-type discoloration and significant edema extending at least to the knees.  Her peripheral pulses are not easily palpable and on handheld Doppler, her dorsalis pedis pulses appear to be monophasic bilaterally.  Her posterior tibials biphasic on the right and monophasic on the left.  About mid-tibial area on the anterior aspect of the right lower extremity is a large ulcer measuring 7.5 x 5.6 x 0.1 cm, partial thickness with no loose skin at the base or margins.  On the left  medial ankle is another small ulceration, again quite clean and only partial thickness measuring 0.9 x 1.0 x 0.1 cm.  IMPRESSION:  Recurrent stasis ulcerations, both lower extremities.  DISPOSITION:  This was explained to the patient what the process going on is and that we feel like she can safely tolerate wraps which she did have used at this clinic previously with satisfaction.  Her wounds were treated with application of silver collagen and she has a lightly wrapped Unna boot applied bilaterally.  She is instructed  that should this become painful, then this to be removed and she needs to notify us immediately.  Otherwise, she is set for return appointment in 1 week.          ______________________________ Orlando Penner. London Pepper, M.D.     RES/MEDQ  D:  01/29/2011  T:  01/30/2011  Job:  [id number]

## 2011-01-30 NOTE — Discharge Summary (Signed)
[name], [name]              ACCOUNT NO.:  [id number]   MEDICAL RECORD NO.:  [id number]          PATIENT TYPE:  INP   LOCATION:  6073                         FACILITY:  [facility]   PHYSICIAN:  Melissa Montane, M.D.       DATE OF BIRTH:  22-Dec-1930   DATE OF ADMISSION:  07/27/2007  DATE OF DISCHARGE:  07/27/2007                               DISCHARGE SUMMARY   ADMISSION DIAGNOSIS:  Nasal fracture.   DISCHARGE DIAGNOSIS:  Unknown, as the patient is being admitted by [facility].   PAST SURGICAL HISTORY:  None.   HISTORY OF PRESENT ILLNESS:  This is a 77 year old who tripped on a rug,  fell and hit her face.  She initially had a lot of epistaxis which now  has resolved.  She does have nasal congestion currently and this is  bilaterally.  She has no malocclusion.  She is not having any otorrhea.  She has no double vision or vision changes.  The patient does have a lot  of swelling and ecchymoses around the nose and orbit.   PHYSICAL EXAMINATION:  GENERAL:  She is awake and alert.  Tympanic  membranes are normal bilaterally.  HEENT:  Both eyes have some infraorbital ecchymoses, but no bony step-  offs and no obvious injury to the eyes themselves.  Vision is intact.  The septum appears to be without any septal hematoma, but there is blood  in both sides of the nose and some congestion of the turbinates.  The  dorsum has a significant amount of swelling and ecchymoses across the  dorsum and I cannot palpate an obvious deformity of the bones because of  the swelling.  Oral cavity with no evidence of ecchymoses or injury.  Teeth are in good repair with normal occlusion.  NECK:  No swelling or adenopathy.   LABORATORY DATA AND X-RAY FINDINGS:  CT scan showing nasal bones are  fractured in multiple locations and do look slightly depressed.  The  septum is probably fractured as well.  There are no other fractures  identified.   ASSESSMENT/PLAN:  Nasal fracture.  She has multiple  fractures of her  nasal bone.  Currently, it is difficulty to tell if there are any  deformities secondary to the swelling.  We talked about letting this  swelling go down over the next week and then reassess in my office.  She  will follow up in the office in about 1 week, sooner if any further  epistaxis or other issues come up.  She should not blow her nose, but  use saline irrigation for cleansing the blood.           ______________________________  Melissa Montane, M.D.     JB/MEDQ  D:  07/27/2007  T:  07/28/2007  Job:  [id number]

## 2011-01-30 NOTE — Assessment & Plan Note (Signed)
[facility]   [name] [name], [name] [name]              ACCOUNT NO.:  [id number]   MEDICAL RECORD NO.:  [id number]      DATE OF BIRTH:  25-Jul-1931   PHYSICIAN:  Epifania Gore. Nils Pyle, M.D. VISIT DATE:  06/17/2007                                   OFFICE VISIT   SUBJECTIVE:  [name] [name] is a 77 year old lady who we have treated for  bilateral stasis with a right lower extremity ulceration.  She was  treated with bilateral Unna boots over the last week.  She returns for  follow-up.  There has been no interim pain, malodor or fever.   OBJECTIVE:  Blood pressure is 125/61, respirations 18, pulse rate 56,  temperature 98.3.  Inspection of the lower extremity shows that the left  ulceration has 100% epithelialization, there is no evidence of abscess  formation.  The capillary refill is brisk.  The pulses are  indeterminate.  There is no evidence of ischemia.  The bilateral 3+  edema is persistent.   PLAN:  We will return the patient to bilateral Unna wraps for an  additional week.  We will plan to make a transition to external  compression hose  over the next week.      Harold A. Nils Pyle, M.D.  Electronically Signed     HAN/MEDQ  D:  06/17/2007  T:  06/17/2007  Job:  [id number]

## 2011-01-30 NOTE — H&P (Signed)
[name] [name], [name] [name]              ACCOUNT NO.:  [id number]   MEDICAL RECORD NO.:  [id number]          PATIENT TYPE:  INP   LOCATION:  0104                         FACILITY:  [facility]   PHYSICIAN:  Helen Hashimoto, MD    DATE OF BIRTH:  08-29-1931   DATE OF ADMISSION:  07/27/2007  DATE OF DISCHARGE:  07/03/2007                              HISTORY & PHYSICAL   PRIMARY PHYSICIAN:  Dr. Lajean Manes.   CHIEF COMPLAINT:  The patient is brought today because she fell down.   HISTORY OF PRESENT ILLNESS:  This is an elderly 77 year old female with  past medical history of multiple problems, presented with the above-  mentioned complaint.  The patient is a very poor historian.  Her history  is very unreliable.  She stated that today she went to the bathroom and  then she lost her balance on the rug in the bathroom.  The patient fell  down and hit her face and was brought to the ER for further evaluation.  She never lost her consciousness.  She denied chest pain.  There is no  shortness of breath.  In the ER she had a fractured nose and she was  noticed to have lower extremity edema swelling, redness and tenderness,  and skin was warm to touch.  Venous Doppler was done in the ER and it  came to be normal.  Her WBC was 27, compared to 9 on April 02, 2007.  The  hospitalist service was called for further evaluation.   PAST MEDICAL HISTORY:  Significant for:  1. History of chronic idiopathic thrombocytopenia.  2. Chronic low back pain and chronic leg pain.  3. Hypertension.  4. Osteopenia.  5. History of decubitus right medial distal tibia.  6. Osteoarthritis of the right hip.  7. Anxiety/depression.   PAST SURGICAL HISTORY:  1. Low back surgery in May 2005.  2. Implantable nerve stimulator to her back in December 2007.   CURRENT MEDICATIONS:  1. Gabapentin 800 mg 4 times a day.  2. Percocet 5/325 mg 1-2 tabs q.4h as needed.  3. Lyrica 75 mg 3 times daily.  4. Metoprolol 25 mg p.o. once  daily.  5. Furosemide 40 mg p.o. twice daily.  6. Lovastatin 10 mg p.o. once daily.  7. Klor-Con 8 mEq once a day.  8. Lisinopril 40 mg in the morning and 20 mg in the evening.  9. Stool softener Colace 100 mg once a day.  10.Vitamin B12 1000 mcg once a day.  11.Calcium 600 mg once a day.  12.Alprazolam 0.5 mg 3 times a day as needed.   SOCIAL HISTORY:  The patient denies smoking, no alcohol drinking, no  drug use.  The patient is retired and lives at the [facility].  She is  retired from [employer].  She has 2 adopted children.   MEDICAL HISTORY:  Her medical physician is Dr. Felipa Eth.  Her back  surgeon is Dr. and her hematologist is Dr. Marin Olp.   FAMILY HISTORY:  Her mother died at age 19 with Hodgkin's Lymphoma.  Father died at 72 with heart attack.  Her half brother deceased with  stroke.   REVIEW OF SYSTEMS:  As in HPI.   PHYSICAL EXAMINATION:  VITAL SIGNS:  Temperature is 99.2, heart rate is  98, respiratory rate 16.  GENERAL APPEARANCE:  This is lovely female in no acute distress.  HEENT:  Conjunctiva is pink, pupils are equally round and reactive to  light and accommodation.  There is no scleral icterus.  Oropharynx:  There is no discharge or infection.  Oral mucosa is dry.  No pharyngeal  erythema.  NECK:  Supple, no JVD, no carotid bruits.  No thyroid enlargement.  CVS:  S1 and S2 are regular.  There are no murmurs, no gallops or rubs.  LUNGS:  RR is 16-18 .  There is no use of accessory muscles . No rhonchi  or wheezes.  ABDOMEN:  Soft, not distended.  There is no tenderness, no  hepatosplenomegaly.  Bowel sounds are normal.  LOWER EXTREMITIES:  Left lower extremity is red, swollen, tender.  She  has venous stasis ulcer in her left medial malleolus.  NEURO:  The patient is confused.  No evidence of neurological deficit.   LABORATORY DATA:  WBC is 27.8, hemoglobin 14.1, hematocrit 41.3, MCV  88.3, platelet count is 54.  Sodium 141, potassium 4.4, chloride 97,   bicarb 34, glucose 169.  BUN 24, creatinine 1.55, calcium 9.5.   ASSESSMENT:  1. Lower extremity cellulitis.  The patient has a low-grade fever,      white count is 27,000.  We will admit to the floor for IV      antibiotics.  We will start her on Ancef 1 gram q.8h.  The patient      has had blood cultures sent from the ER and will follow the      results.  Received one dose in the ER of Vancomycin and I will      continue her for now on the Vancomycin.  I will keep her feet      elevated and will get wound consult for her venous stasis ulcer.  2. Nose fracture secondary to a fall.  We will give her pain      medications as needed.  3. Venous stasis ulcers.  This patient used to follow with wound care      clinic and she was discharged from there.  We will also send during      her hospitalization.   Her other admitting problems remain stable, and those include:  1. Hypertension.  2. Osteoarthritis.  3. Chronic lower back pain.   DISPOSITION:  Her other medical problems remain stable and we will  continue all her medications taken prior to her admission, in addition  to IV antibiotics to cover her current infection.   Admission time is one hour.      Helen Hashimoto, MD  Electronically Signed     NAE/MEDQ  D:  07/27/2007  T:  07/27/2007  Job:  [id number]   cc:   Hal T. Stoneking, M.D.  Fax: [phone]

## 2011-01-30 NOTE — H&P (Signed)
NAME:  [name] [name], [name] [name]              ACCOUNT NO.:  [id number]   MEDICAL RECORD NO.:  [id number]          PATIENT TYPE:  INP   LOCATION:  NA                           FACILITY:  [facility]   PHYSICIAN:  Lockie Pares, M.D.    DATE OF BIRTH:  1931/03/24   DATE OF ADMISSION:  03/28/2007  DATE OF DISCHARGE:                              HISTORY & PHYSICAL   CHIEF COMPLAINT:  Right hip pain for the last two months.   HISTORY OF PRESENT ILLNESS:  The 77 year old white female patient  presented to Dr. French Ana with a 65-monthhistory of sudden onset right  hip pain.  She reports the pain started after she cleaned out an extra  room, and she has had no injury or prior surgery to the hip.  At this  point, the pain in the right hip is an intermittent hurt in the  trochanteric region without radiation.  It is worse with certain  movements and range of motion and then decreases with OxyContin and  Percocet.  She cannot sleep on the right side, and she has great  difficulty putting on her socks and shoes.  There is no popping,  catching, grinding or locking.  No paresthesias associated with the  pain.  She has been using a rolling walker for the last 2 years.  She  does have chronic low back pain and lower extremity pain for which she  does have an implanted nerve stimulator.   ALLERGIES:  No known drug allergies.   CURRENT MEDICATIONS:  1. Alprazolam 0.5 mg 1 tablet p.o. three times daily p.r.n. anxiety.  2. Lyrica 75 mg 1 capsule p.o. three times daily.  3. Gabapentin 800 mg 1 tablet p.o. 4 times a day.  4. Aleve 1 capsule p.o. q.4 h p.r.n. for pain. Stopped on March 20, 2007.  5. Tramadol 50 mg 1 tablet p.o. q.4 h p.r.n. for pain.  6. OxyContin 20 mg 1 tablet p.o. three times daily.  7. Percocet 5/325 mg 1 tablet p.o. q.4 h p.r.n. for pain, normally      about three a day.  8. OxyIR 30 mg 1/2 tablet to 1 tablet p.o. q.6 h p.r.n. for pain.  9. Aspirin 81 mg 1 tablet p.o. q.a.m., quit mid  June.  10.Lasix 40 mg 2 tablets p.o. b.i.d. for 4 days and then 2 tablets      p.o. q.a.m. This was started on July 3.  11.Klor-Con 8 mEq 1 tablet p.o. b.i.d.  12.Lisinopril 40 mg 1 tablet p.o. q.a.m. and 1/2 tablet p.o. q.p.m.  13.Lovastatin 10 mg 1 tablet p.o. q.p.m.  14.Metoprolol 25 mg 1 tablet p.o. q.a.m.  15.Calcium citrate 500 mg 2 tablets p.o. q.a.m.  16.Vitamin C 500 mg 1 tablet p.o. q.a.m.  17.Vitamin B12 1000 mcg 1 tablet p.o. q.a.m.  18.Omega-3 fish oil 1000 mg 2 capsules p.o. q.a.m.  19.Multivitamin 1 tablet p.o. q.a.m.  20.Chondroitin and glucosamine 1 tablet p.o. b.i.d.  21.Colace 100 mg 1 capsule p.o. q.a.m. p.r.n.   PAST MEDICAL HISTORY:  1. History of chronic idiopathic thrombocytopenia.  2. Chronic low back pain and leg pain.  3. Hypertension.  4. Osteopenia.  5. History of decubitus right medial distal tibia.   PAST SURGICAL HISTORY:  1. Low back surgery May 2005.  2. Implantable nerve stimulator to her back December 2007.   SOCIAL HISTORY:  She denies any history of cigarette smoking, alcohol  use or drug use.  She is retired and lives at the [facility].  She is  retired from [employer]. She has two adopted children.  Her medical doctor  is Dr. Felipa Eth, and her back surgeon was Dr. Kristeen Miss.  Dr.  Marin Olp is her hematology-oncology doctor.   FAMILY HISTORY:  Mother died at age of 62 with Hodgkin's disease.  Father died at age 18 with a heart attack.  She has a half-brother who  is deceased with a history of strokes.  Her children are adopted.   REVIEW OF SYSTEMS:  She does see double at times due to her medications.  She has had a change in vision, and she does wear glasses.  She does  have hoarseness in the morning at times.  She does have partial plate  dentures.  She gets short of breath at times.  She did have whooping  cough as a child.  She has some easy bruising and occasional nosebleeds.  She does have problems with ankle swelling.  She went  through menopause  in her 37s, and she does have nocturia about two times a night.  Other  systems are negative and noncontributory.   PHYSICAL EXAMINATION:  GENERAL:  Well-developed, well-nourished white  female in no acute distress.  Talks easily with the examiner.  Walks  with an antalgic gait and flexed markedly at the waist, probably a good  100 degrees.  She does walk with the use of a rolling walker.  Mood and  affect are appropriate.  Talks easily with examiner.  Height 5 feet 4 inches, weight 200 pounds, BMI is 33.  VITAL SIGNS:  Temperature 98.2 degrees Fahrenheit, pulse 64,  respirations 14 and blood pressure 122/60.  HEENT:  Normocephalic, atraumatic without frontal or maxillary sinus  tenderness to palpation.  Conjunctiva pink.  Sclerae anicteric.  PERRLA.  EOMs intact.  No visible external ear deformities.  Hearing grossly  intact.  Tympanic membranes pearly gray bilaterally with good light  reflex.  Nose and nasal septum midline.  Nasal mucosa pink and moist  without exudates or polyps noted.  Buccal mucosa pink and moist.  Dentition in good repair.  Pharynx without erythema or exudates.  Tongue  and uvula midline.  Tongue without fasciculations, and uvula rises  equally with phonation.  NECK:  No visible masses or lesions noted.  Trachea midline.  No  palpable lymphadenopathy nor thyromegaly.  Carotids +1 bilaterally  without bruits.  Full range of motion. Nontender to palpation along the  cervical spine.  CARDIOVASCULAR:  Heart rate and rhythm regular.  S1 and S2 present with  a grade 2/6 systolic murmur heard right at that right sternal border  second intercostal space.  RESPIRATORY:  Respirations even and unlabored.  Breath sounds clear to  auscultation bilaterally without rales or wheezes noted.  ABDOMEN:  Rounded abdominal contour.  Bowel sounds present x4 quadrants.  Soft, nontender to palpation without hepatosplenomegaly nor CVA  tenderness.  Femoral pulses +2  bilaterally.  Nontender to palpation  along the vertebral column.  BREASTS/GU/RECTAL/PELVIC:  These exams deferred at this time.  MUSCULOSKELETAL:  No obvious deformities bilateral upper  extremities  with full range of motion of these extremities without pain.  Radial  pulses +2 bilaterally.  Full range of motion of her knees, ankles and  toes bilaterally.  DP and PT pulses are +2.  She does have +3 pitting  edema of both lower extremities.  No calf pain with palpation.  Negative  Homans' sign bilaterally.  She does have two small scabbed ulcers on  both lower extremities, worse on the right than on the left.  LEFT HIP:  Is lacking about 10 degrees of full extension, can flex to  120 degrees with full internal and external rotation without pain.  No  pain with palpation in the groin or over the greater trochanter.  RIGHT HIP:  Has a flexion contracture that is a least 20-30 degrees.  She is lacking for full extension. Can flex only to 90 degrees with very  little internal and external rotation, all with pain with range of  motion.  Minimal pain with palpation in the groin.  NEUROLOGIC:  Alert and oriented x3.  Cranial nerves II-XII are grossly  intact.  Strength 5/5 bilateral upper and lower extremities.  Rapid  alternating movements intact.  Deep tendon reflexes 2+ bilateral upper  and lower extremities.  Sensation intact to light touch.   RADIOLOGIC FINDINGS:  X-rays taken of both hips that were brought with  her show significant osteoarthritis of both hips.   IMPRESSION:  1. End-stage osteoarthritis bilateral hips, right worse than left.  2. Idiopathic thrombocytopenia.  3. Chronic low back pain and leg pain with implanted nerve stimulator.  4. Hypertension.  5. Osteopenia.  6. History of lower extremity decubitus.   PLAN:  Ms. Vanwinkle will be admitted to [facility] on March 28, 2007, where she will undergo a right total hip arthroplasty by Dr. Vangie Bicker.  She  is to receive a 10-pack of platelets 1/2 hour  before surgery per Dr. Antonieta Pert recommendation, and that will be done  preoperatively.  She will undergo all the routine preoperative  laboratory tests and studies prior to this procedure.  If we have any  medical issues while she is hospitalized, we will consult Dr. Felipa Eth  and/or Dr. Marin Olp.      Vonita Moss Duffy, Alisa Graff, M.D.  Electronically Signed    KED/MEDQ  D:  03/25/2007  T:  03/25/2007  Job:  [id number]

## 2011-02-01 ENCOUNTER — Encounter (HOSPITAL_BASED_OUTPATIENT_CLINIC_OR_DEPARTMENT_OTHER): Payer: Medicare Other | Admitting: Hematology & Oncology

## 2011-02-01 ENCOUNTER — Other Ambulatory Visit: Payer: Self-pay | Admitting: Hematology & Oncology

## 2011-02-01 DIAGNOSIS — D693 Immune thrombocytopenic purpura: Secondary | ICD-10-CM

## 2011-02-01 LAB — CBC WITH DIFFERENTIAL (CANCER CENTER ONLY)
BASO#: 0 10*3/uL (ref 0.0–0.2)
HCT: 41.3 % (ref 34.8–46.6)
LYMPH#: 2 10*3/uL (ref 0.9–3.3)
LYMPH%: 24 % (ref 14.0–48.0)
MCH: 30.8 pg (ref 26.0–34.0)
MCHC: 33.9 g/dL (ref 32.0–36.0)
MCV: 91 fL (ref 81–101)
MONO#: 0.5 10*3/uL (ref 0.1–0.9)
RBC: 4.55 10*6/uL (ref 3.70–5.32)
WBC: 8.1 10*3/uL (ref 3.9–10.0)

## 2011-02-02 ENCOUNTER — Ambulatory Visit (HOSPITAL_BASED_OUTPATIENT_CLINIC_OR_DEPARTMENT_OTHER)
Admission: RE | Admit: 2011-02-02 | Discharge: 2011-02-02 | Disposition: A | Discharge status: Home / Self Care | Payer: Medicare Other | Source: Ambulatory Visit | Attending: Ophthalmology | Admitting: Ophthalmology

## 2011-02-02 DIAGNOSIS — H5010 Unspecified exotropia: Secondary | ICD-10-CM | POA: Insufficient documentation

## 2011-02-02 DIAGNOSIS — I10 Essential (primary) hypertension: Secondary | ICD-10-CM | POA: Insufficient documentation

## 2011-02-02 DIAGNOSIS — D693 Immune thrombocytopenic purpura: Secondary | ICD-10-CM | POA: Insufficient documentation

## 2011-02-02 NOTE — Discharge Summary (Signed)
NAMEVANIYAH, [name] [name]              ACCOUNT NO.:  [id number]   MEDICAL RECORD NO.:  [id number]          PATIENT TYPE:  OIB   LOCATION:  4982                         FACILITY:  [facility]   PHYSICIAN:  Earleen Newport, M.D.  DATE OF BIRTH:  [birth year]   DATE OF ADMISSION:  09/23/2006  DATE OF DISCHARGE:  09/25/2006                               DISCHARGE SUMMARY   ADMITTING DIAGNOSES:  Intractable back pain status post arthrodesis L1-  S1 with limited mobility.   DISCHARGE AND FINAL DIAGNOSES:  1. Intractable back pain status post arthrodesis L1-S1 with limited      mobility.  2. Lumbar scoliosis.   CONDITION ON DISCHARGE:  Improving.   Ms. [name] [name] is a 77 year old individual who was admitted to the  hospital on January 7 to undergo placement of an implantable spinal cord  stimulator. She had been suffering with intractable back pain for a  period of 4 years.  She underwent significant surgical decompression  arthrodesis for severe spondylitic scoliosis and has been troubled with  chronic back pain since that time.  The patient underwent placement of  the spinal cord stimulator. She seemed to tolerate this well but had  considerable difficulty with nausea during the postoperative period.  She was treated with IV fluids and given Phenergan. On the second  postoperative day, this condition seems to be better.  She is still  requiring significant doses of narcotic analgesics mostly for the  surgical pain and continued back pain.  She does feel some relief with  the spinal cord stimulator.  Her incision is clean and dry, and she is  discharged home with a prescription for Percocet #100 to be used on a  p.r.n. basis in addition to her normal dose of OxyContin. The patient is  also given a prescription for Phenergan 25 mg, #20, without refills.  She will be seen in the office in two weeks' time.      Earleen Newport, M.D.  Electronically Signed     HJE/MEDQ  D:  09/25/2006  T:   09/25/2006  Job:  [id number]

## 2011-02-02 NOTE — Consult Note (Signed)
[name] [name], [name] [name]              ACCOUNT NO.:  [id number]   MEDICAL RECORD NO.:  [id number]          PATIENT TYPE:  OUT   LOCATION:  VASC                         FACILITY:  [facility]   PHYSICIAN:  Orlando Penner. Sevier, M.D. DATE OF BIRTH:  October 29, 1930   DATE OF CONSULTATION:  01/23/2005  DATE OF DISCHARGE:  01/01/2005                                   CONSULTATION   HISTORY:  This 77 year old white female is seen out of the courtesy of Dr.  Christiane Ha T. [facility] for assistance with management of cellulitis with  ulceration on the medial distal right lower extremity.   The patient has had some problem with edema on her extremities through the  years but has never had previous cellulitis or stasis ulceration.   More recently, she had been troubled with pain in the left heel which have  been thought to be due to plantar fasciitis and in fact in late March was  injected in that area, presumably with a steroid. It was some 10-12 days  later that she developed what appeared to be a cellulitis on the medial  aspect of the distal right lower extremity in the supramalleolar area. She  saw Dr. Felipa Eth who placed her on antibiotics and told her to avoid  weightbearing to the extent she possibly could. Despite this, the situation  has progressed and she was recently changed to doxycycline 100 milligrams  daily. She noted recently some weeping in the area and superficial  ulceration and it was for this reason that she then eventually was referred  here for our further evaluation and advice.   PAST MEDICAL HISTORY:  Notable for the fact that she is not diabetic, has no  known peripheral vascular disease. Mildly hypertensive on treatment and is  also hyperlipidemic.   ALLERGIES:  She is said to be allergic to [facility] which causes a rash.   MEDICATIONS:  Her regular medications include alprazolam, furosemide,  tramadol, Lyrica, Lisinopril, potassium, calcium, glucosamine chondroitin,  Toprol XL, baby  aspirin daily, multiple vitamin daily, and OxyContin 20  milligrams every 12 hours.   PHYSICAL EXAMINATION:  EXTREMITIES:  Examination today is limited to the  distal lower extremities. The left lower extremity is mildly edematous but  otherwise is unremarkable and has no active lesions and will not be further  described. The right lower extremity is characterized by a fairly  significant edema. Estimated that to 3+ with some pitting extending to the  knee. There is an area of erythema in the right medial malleolar area and  extending some 10 cm proximal to that. This area is not hot or otherwise  extreme but there is clearly an erythematous discoloration. In the center  portion of this just proximal to the medial malleolus is a superficial ulcer  measuring 2.8 x 4.6 x 0.02 centimeters in its diameters. There is a moderate  serous drainage but no odor and skin margins are nice flat and intact.  Doppler determination shows that pulses are biphasic in both posterior  tibial and dorsalis pedis locations. There is no evidence of infection at  the area on  the medial aspect of the heel where the patient indicates the  previous injection was applied.   IMPRESSION:  Cellulitis with superficial ulceration right lower extremity.   DISPOSITION:  1.  The area is cleansed properly and then sharply debrided partial-      thickness of some crust and loose skin. This is done without any need      for pain control. There is no significant bleeding and the area was then      available for dressing and was dressed with Silvadene cream, Silverlon      patch over the area of open ulceration, wound contact layer, than soft      sore pad, and a cautious Kerlix Coban wrap. The the patient was      instructed to continue her doxycycline but to this was added Septra DS 1      tablet twice daily empirically to give better coverage against the      appearance of possible Staphylococcus.  2.  Follow-up visit was  to be in this clinic in one week.      RES/MEDQ  D:  01/30/2005  T:  01/30/2005  Job:  [id number]   cc:   Hal T. Stoneking, M.D.  [address] [place], [place] [address]  Fax: [phone]

## 2011-02-02 NOTE — Discharge Summary (Signed)
NAME:  ELLAR, HAKALA                        ACCOUNT NO.:  [id number]   MEDICAL RECORD NO.:  [id number]                   PATIENT TYPE:  INP   LOCATION:  3035                                 FACILITY:  [facility]   PHYSICIAN:  Marchia Meiers. Vertell Limber, M.D.               DATE OF BIRTH:  [birth year]   DATE OF ADMISSION:  02/01/2004  DATE OF DISCHARGE:  02/09/2004                           DISCHARGE SUMMARY - REFERRING   REASON FOR ADMISSION:  Lumbar spondylosis and stenosis L1-S1 with severe  spinal canal compression with back and bilateral lower extremity pain.  She  has spondylolisthesis of L4 and L5.  It was elected to take Ms. Burdine to  surgery and she was admitted to the hospital the same day as procedure basis  on Feb 01, 2004, at which point she underwent decompression and fusion from  L1-S1 using pedicle screw fixation and after decompressive laminectomy for  spinal stenosis and spondylolisthesis was performed.   Postoperatively the patient did well.  She had some back pain observed in  the ACU postoperatively.  Was gradually mobilized in her brace and was  eventually transferred out of the intensive care unit to the regular floor.  It was elected to send her to the skilled nursing facility for postoperative  recovery at [facility] where she currently resides.   DISCHARGE MEDICATIONS:  1. Hydrochlorothiazide 25 mg daily.  2. Lisinopril 20 mg daily and 40 mg daily.  3. Norvasc 10 mg daily.  4. Lexapro 10 mg daily.  5. Senokot/Docusate one twice daily.  6. Simvastatin 5 mg daily.  7. Potassium chloride 20 mEq twice daily.  8. Nystatin 500,000 international units p.o. q.i.d. swish and swallow.  9. Diazepam 5 mg q.4h. p.r.n. orally.  10.      Tylox one to two every four to six hours as needed for pain.  11.      Alprazolam 0.5 mg p.o. t.i.d.   INSTRUCTIONS FOR THE PATIENT:  She is to wear brace when up and walking to  ambulate with assistance and to work with physical  therapy at [facility].  She is to follow up with Dr. Ellene Route in the office in three weeks with AP and  lateral lumbar spine radiographs.                                                Marchia Meiers. Vertell Limber, M.D.    JDS/MEDQ  D:  02/09/2004  T:  02/09/2004  Job:  [id number]

## 2011-02-02 NOTE — Op Note (Signed)
NAMECALLISTA, [name] [name]              ACCOUNT NO.:  [id number]   MEDICAL RECORD NO.:  [id number]          PATIENT TYPE:  OIB   LOCATION:  5003                         FACILITY:  [facility]   PHYSICIAN:  Earleen Newport, M.D.  DATE OF BIRTH:  1931-05-12   DATE OF PROCEDURE:  09/23/2006  DATE OF DISCHARGE:                               OPERATIVE REPORT   PREOPERATIVE DIAGNOSIS:  Intractable back and leg pain status post  lumbar decompression arthrodesis L1-S1.   POSTOPERATIVE DIAGNOSIS:  Intractable back and leg pain status post  lumbar decompression arthrodesis L1-S1.   PROCEDURE:  Implantation of permanent spinal cord stimulator.   SURGEON:  Earleen Newport, M.D.   ASSISTANT:  None.   ANESTHESIA:  General endotracheal.   INDICATIONS:  [name] [name] is a 77 year old individual who has had  significant back and bilateral lower extremity pain.  She had previous  significant spondylitic stenosis.  She was treated with a trial spinal  cord stimulator and responded very favorably and desires to have a  permanent spinal cord stimulator implanted.   PROCEDURE:  The patient was brought to the operating room supine on the  stretcher.  After smooth induction of general endotracheal anesthesia  she was turned prone.  The back was prepped with alcohol and DuraPrep  and draped in a sterile fashion to expose the midline of the upper  lumbar spine at the thoracolumbar junction and also the right flank  where the spinal cord stimulator generator would be placed in the  subcutaneous pouch.  The patient has also been given a pheresis of  platelets which was started at the start of the case as she is known to  be thrombocytopenic.  The midline incision was opened down to the  thoracolumbar dorsal fascia.  The fascia was opened on the right side of  midline and spinous process of T11-T12 was exposed.  The laminotomy was  then created in this site on the right side removing a good portion of  superior  portion lamina of C12 and inferior portion lamina of T11.  When  the dura was identified, enough of it was cleared so as to allow  placement of a tripolar spinal cord stimulation catheter.  This was then  threaded onto the dorsal surface of the spine and localization  radiographs were obtained.  When adequate positioning was obtained, a  second incision was made in the region of right flank, and a  subcutaneous pouch was formed.  This was enlarged to allow placement of  the spinal cord stimulator generator.  The leads were then tunneled in  the subcutaneous tunnel up to the incision and the leads were connected  with the available connectors and dry seals were placed on the ends of  the electrodes.  The leads from the generators were then connected and  the generator along with the leads were placed in the subcutaneous pouch  with care being taken to make sure that the excess of the leads were  coiled underneath the generator itself.  Generator was carefully  positioned with the written surface underneath the surface of the  skin.  With this system thus being connected, final localizing radiograph was  taken of the position of the electrode.  Then the leads were coiled in  the fascia.  The thoracodorsal fascia was closed  with #1 Vicryl interrupted fashion, 2-0 Vicryl was then using  subcutaneous tissues and 3-0 Vicryl subcuticularly in the back incision  and a similar closure was performed in the abdominal incision.  Dry  sterile dressings were applied.  The patient tolerated procedure well,  was returned to recovery room in stable condition.      Earleen Newport, M.D.  Electronically Signed     HJE/MEDQ  D:  09/23/2006  T:  09/24/2006  Job:  [id number]

## 2011-02-02 NOTE — Op Note (Signed)
NAME:  EMILLEE, TALSMA              ACCOUNT NO.:  [id number]   MEDICAL RECORD NO.:  [id number]          PATIENT TYPE:  AMB   LOCATION:  SDS                          FACILITY:  [facility]   PHYSICIAN:  Earleen Newport, M.D.  DATE OF BIRTH:  [birth year]   DATE OF PROCEDURE:  09/05/2006  DATE OF DISCHARGE:  09/05/2006                               OPERATIVE REPORT   PREOPERATIVE DIAGNOSIS:  Chronic low back pain with lumbar  radiculopathy.   POSTOPERATIVE DIAGNOSIS:  Chronic low back pain with lumbar  radiculopathy.   PROCEDURE:  Insertion of spinal cord stimulator trial.   INDICATIONS:  Ms. Ily Denno is a 77 year old individual who  underwent a long arthrodesis and decompression for severe spondylitic  stenosis and scoliosis approximately four years ago.  She had good  relief of her back pain but has had continued problems with chronic  radicular pain into the backs of both thighs, buttocks and both lower  extremities and the calves and into the bottom of the feet.  After  failing efforts considerably tried at conservative management including  nonopioid medications, Neurontin, Lyrica, among other medications.  She  has become reliant on opioid medication ultimately and is still having  substantial pain.  Spinal cord stimulator trial is now being offered   PROCEDURE:  The patient was brought to the operating room, placed on the  table in a prone position with appropriate bolsters.  Some modest  analgesia was induced by the anesthetist, and the back was then prepped  with DuraPrep and draped in sterile fashion.  Fluoroscopy was used to  localize the T12-L1 interspace.  The area just below this on the skin  was infiltrated with about 6 mL of lidocaine without epinephrine, in  addition to 1/2% Marcaine.  A stab incision was then created with #11  blade, and a 17-gauge Tuohy needle was then inserted into the epidural  space using a loss resistance technique.  The placement was such  that  the catheter threaded easily up to the junction of T11-T12.  Trial  stimulation identified good coverage of both distal lower extremities  from the region of the buttocks down.  With this, the needle was  withdrawn.  The electrode was sewn into place on the surface of the skin  with 2-0 silk sutures.  A dry sterile dressing was applied to the back,  and the patient was returned to the recovery room in stable condition.  She will have the stimulator trial for a period of nearly a week's time.      Earleen Newport, M.D.  Electronically Signed     HJE/MEDQ  D:  09/05/2006  T:  09/06/2006  Job:  [id number]

## 2011-02-02 NOTE — Discharge Summary (Signed)
[name] [name], [name] [name]              ACCOUNT NO.:  [id number]   MEDICAL RECORD NO.:  [id number]          PATIENT TYPE:  INP   LOCATION:  6433                         [facility]ACILITY:  [facility]   PHYSICIAN:  Irine Seal, MD    DATE OF BIRTH:  1931/05/27   DATE OF ADMISSION:  07/27/2007  DATE OF DISCHARGE:  08/06/2007                               DISCHARGE SUMMARY   DISCHARGE DIAGNOSES:  1. Right lower extremity cellulitis.  2. Acute renal failure.  3. Nasal fractures.  4. Chronic idiopathic thrombocytopenia.  5. Venous stasis ulcers.  6. Status post falls.  7. Hypertension.  8. Chronic lower back pain.  9. Osteoarthritis.  10.Anxiety/depression.  11.Osteopenia.  12.Hyperlipidemia.  13.Presumed urinary tract infection which was with cultures negative.   DISCHARGE MEDICATIONS:  1. Clindamycin 300 mg p.o. t.i.d. x2 days.  2. Metoprolol 50 mg p.o. b.i.d.  3. Lasix 40 mg p.o. b.i.d.  4. Lovastatin 10 mg p.o. daily.  5. Tramadol 50 mg q.i.d.  6. Vitamin B12 1000 mcg daily.  7. Multivitamin 1 tablet p.o. daily.  8. Aspirin 81 mg p.o. daily.  9. Calcium citrate 2 tabs daily.  10.Colace 100 mg one tab daily as needed.  11.Gabapentin 800 mg q.i.d.  12.Klor-Con 10 mEq p.o. daily.  13.OxyContin 20 mg SR t.i.d.  14.Oxycodone 15 mg 2 tabs q.4 h p.r.n.   DISPOSITION:  The patient will be discharged back to the independent  living facility the [facility] with PT and OT. The patient will need a  basic metabolic profile to be followed in about a week to monitor the  patient's renal function and electrolytes. The patient will also need a  CBC to follow on her thrombocytopenia and also the patient will need to  followup with PCP in the next 1-2 weeks for followup on her right lower  extremity cellulitis.   CONSULTATIONS:  A consult was done with Dr. Janace Hoard of ENT on July 27, 2007. A PT/OT consult was also done and also a wound care consult was  also done as well.   PROCEDURE:  Right  lower extremity venous Dopplers were done on July 27, 2007 which was negative for DVT, SVT and Baker cyst. This showed an  enlarged lymph node in the right groin. A CT of the head was also done  on July 27, 2007 that showed no acute intracranial abnormalities. CT  of the orbits was also done on July 27, 2007 that showed nasal bone  and nasal septal fractures. Plain films of the left knee were done on  August 01, 2007 that showed tricompartment degenerative disease which  was stable, no acute bony findings, no joint effusions.   HISTORY OF PRESENT ILLNESS:  The patient is an elderly, 77 year old  female with a past medical history of multiple medical problems who  presented with a fall on the day of admission. The patient is a very  poor historian and her history was very unreliable. The patient had  stated that on the day she fell she went to the bathroom where she lost  her balance on the room  in the bathroom. The patient fell down and hit  her face and was brought to the emergency department for further  evaluation. The patient denies any loss of consciousness, the patient  denied any chest pain, no shortness of breath and the ED was showing  that the patient had a fractured nose. It was also noted that the  patient had a lower extremity edema, erythema and tenderness as well as  warmth to touch on the skin. Venous Dopplers were done in the ED which  came back normal. The patient did have a white count of 27 compared to 9  on April 02, 2007. The hospitalist service was called for further  evaluation and management.   PHYSICAL EXAMINATION:  Temperature 99.2, pulse of 98, respiratory rate  16.  GENERAL:  The patient is a pleasant female in no acute distress.  HEENT:  Conjunctiva was pink. Pupils equal round and reactive to light  and accommodation. No scleral icterus. Oropharynx reveals no discharge,  no infection. The mucosa was dry. No pharyngeal edema. The patient had   some ecchymosis around both orbits bilaterally.  NECK:  Supple. No JVD, no carotid bruits, no thyroid enlargement.  LUNGS:  Clear to auscultation bilaterally. No rhonchi, no wheezes.  CARDIOVASCULAR:  Regular rate and rhythm, no murmurs, rubs or gallops.  ABDOMEN:  Soft, nontender, nondistended, positive bowel sounds, no  hepatosplenomegaly.  EXTREMITIES:  Right lower extremity was red, it was erythematous, it was  tender to touch, it was edematous as well. The patient also had a venous  stasis ulcer on her right medial malleolus.  NEUROLOGIC:  The patient seemed confused. Cranial nerves II-XII were  grossly intact. No neurological deficits.   ADMISSION LABS:  CBC, white count 27.8, hemoglobin 14.1, hematocrit  41.3. ANC of 26.7, platelet count of 54. Basic metabolic profile, sodium  141, potassium 4.4, chloride 97, bicarb 34, BUN 24, creatinine 1.55,  glucose of 169. Calcium of 9.5. Urinalysis was yellow, cloudy, specific  gravity of 1.018, pH of 7, urine glucose negative, bilirubin negative,  ketones negative, small blood, protein negative, urobilinogen 0.2,  nitrite positive, leukocytes large. Urine microscopy, 21-50 white blood  cells, red blood cells 0-2, many bacteria.   HOSPITAL COURSE:  1. The patient had been admitted with a right lower extremity      cellulitis which was warm to touch, edematous and erythematous as      well. The patient did have an elevated white count. Lower extremity      Dopplers were done in the emergency room which came back negative.      The patient was initially placed on her Vancomycin. Clindamycin was      further added on July 29, 2007. The patient was also told to      elevate her right lower extremity. The patient's lower extremity      cellulitis improved daily throughout the hospitalization. The      patient's white blood count trended down. The patient remained      afebrile throughout the hospitalization. The patient was then       switched from Vancomycin and clindamycin to oral Bactrim. The      patient tolerated the oral Bactrim but the oral Bactrim had to be      discontinued secondary to acute renal failure. The patient was then      switched from oral Bactrim to clindamycin p.o. which she tolerated      well. Blood cultures were obtained, wound  cultures were obtained      which all came back negative. The patient's right lower extremity      cellulitis improved throughout the hospitalization. There was      decreased erythema, decreased swelling, decreased warmth and was      nontender to palpation by the day of discharge.  The patient was      discharged in stable and improved condition to continue 2 more days      of clindamycin orally to have a total 12-day course of antibiotics.      The patient's right lower extremity will need to be followed up      with her PCP in the next 1-2 weeks for complete resolution.  2. Acute renal failure. The patient initially presented with a      creatinine of 1.55. On admission, the patient was hydrated. ACE      inhibitor was held and her Lasix was decreased. The patient's acute      renal initially resolved on August 01, 2007, the patient's Lasix      was steadily increased and the patient was then switched to Bactrim      from Vancomycin and clindamycin for her right lower extremity      cellulitis. After being on the Bactrim for a couple of days, the      patient redeveloped an acute renal failure again. The patient's      Lasix was then decreased to 40 mg twice a day. The patient was      hydrated gently with 50 mL of normal saline and the patient's      Bactrim was discontinued. The patient's Bactrim was replaced with      Clindamycin. The patient's acute renal failure resolved on the day      of discharge. The patient had a creatinine of 1.0. The patient will      be discharged in stable and improved condition. The patient will      need a basic metabolic profile  followed in the next 1-2 weeks to      monitor her renal function. Will hold out on restarting patient on      the ACE inhibitor at this point in time and will defer to her PCP      for further blood pressure management.  3. Nasal fractures. The patient initially presented with a fall with a      nasal fracture. She was initially seen by Dr. Janace Hoard of ENT on      July 27, 2007 and it was felt that the patient's nasal fractures      need to be monitored until the swelling decreased. The patient was      then again seen on August 06, 2007 by Dr. Janace Hoard again who felt      that the patient had no nasal obstruction. The patient's nose was      straight, there was no septal inflammation and felt no intervention      was needed. The patient to followup on an as-needed basis.      Throughout the hospitalization, the patient's nasal fractures      improved steadily and the patient was discharged in stable and      improved condition.  4. Chronic idiopathic thrombocytopenia. The patient has a baseline of      50-60. On presentation, the patient was at 54,000. The patient was      initially placed on Lovenox for DVT prophylaxis.  The patient's      platelet count decreased to 37,000. The patient's Lovenox was      discontinued. The patient's thrombocytopenia improved steadily      throughout the hospitalization. On the day of discharge, the      patient's platelet count was 107,000. This will need to be followed      up in the next 1-2 weeks to monitor the patient's platelet count.  5. Venous stasis ulcer. The patient had a venous stasis ulcer on her      right malleolus. Wound care was obtained and Mepilex was placed on      the patient's venous stasis ulcers. The patient had some wound care      dressings as well. Wound care followed the patient throughout the      hospitalization. On the day of discharge, the patient's venous      stasis ulcer was resolved.  The patient just had a scar.  The       patient did not have any discharge from the site and this will be      followed on an as-needed basis. The patient was discharged in      stable and improved condition.  6. Status post falls. PT/OT was consulted and worked with the patient      throughout the hospitalization and the patient will be discharged      home on home PT/OT.  7. Presumed urinary tract infection. On initial presentation, the      patient's urinalysis was consistent with a urinary tract infection.      The patient was treated with a 3-day course of ciprofloxacin;      however, urine cultures came back negative.  8. Hypertension. The patient's lisinopril had to be discontinued      during the hospitalization secondary to acute renal failure. The      patient's Lasix was also decreased secondary to acute renal      failure. The patient was maintained on metoprolol during the      hospitalization. This was further titrated up to 50 mg twice a day.      The patient's blood pressure was stable and on the day of      discharge, the patient had a blood pressure of 126-130/58-62. The      patient's blood pressure will need to be reassessed per primary      care physician and the patient's blood pressure medication will be      deferred per primary care physician. The patient was not sent home      on her lisinopril secondary to acute renal failure and her Lasix      was decreased as well secondary to acute renal failure. If further      blood pressure control is needed, the patient's metoprolol may be      further titrated up and will defer the rest of this      antihypertensive treatment to the patient's primary care physician.      The rest of the patient's chronic medical issues were stable      throughout the hospitalization and the patient was discharged home      in stable and improved condition. The patient was discharged to an      independent living facility at the [facility] with PT/OT. On the      day of  discharge vital signs, temperature 98.1, pulse 60,      respirations 18,  blood pressure 130/58, sating 95% on room air.   DISCHARGE LABS:  Basic metabolic profile, sodium 060, potassium 4.0,  chloride 102, bicarb 29, BUN 17, creatinine 1.20, glucose of 106 and a  calcium of 9.1. CBC on the day of discharge, white count 6.4, hemoglobin  12.8, hematocrit 37.3, platelet count of 107.   It has been a pleasure taking care of Ms. Lorriane Adames.      Irine Seal, MD  Electronically Signed     DT/MEDQ  D:  08/08/2007  T:  08/08/2007  Job:  [id number]   cc:   Hal T. Stoneking, M.D.  Fax: [phone]   Melissa Montane, M.D.  Fax: [phone]

## 2011-02-02 NOTE — Op Note (Signed)
NAME:  [name], [name]                        ACCOUNT NO.:  [id number]   MEDICAL RECORD NO.:  [id number]                   PATIENT TYPE:  INP   LOCATION:  3104                                 FACILITY:  [facility]   PHYSICIAN:  Earleen Newport, M.D.               DATE OF BIRTH:  03/12/1931   DATE OF PROCEDURE:  02/01/2004  DATE OF DISCHARGE:                                 OPERATIVE REPORT   PREOPERATIVE DIAGNOSIS:  Lumbar spondylosis and stenosis, L1 to S1.   POSTOPERATIVE DIAGNOSIS:  Lumbar spondylosis and stenosis, L1 to S1.   OPERATION PERFORMED:  A lumbar laminectomy, L1 to S1.  Decompression of  common dural tube and nerve roots.  Pedicle fixation L1 to S1 with AlphaTek  instrumentation, posterolateral arthrodesis with local autograft, L1 to S1.   SURGEON:  Earleen Newport, M.D.   ASSISTANT:  Elizabeth Sauer, M.D.   ANESTHESIA:  General endotracheal.   INDICATIONS FOR PROCEDURE:  Ms. [name] [name] is an 77 year old individual  who has had significant progressive back pain and leg pain with insidious  weakness in her lower extremities.  She had severe spondylitis stenosis with  degenerative changes noted from L1 to S1 including spondylolisthesis at the  level of L4-L5.  She was advised regarding surgical decompression and is now  taken to the operating room for that procedure.   DESCRIPTION OF PROCEDURE:  The patient was brought to the operating room  supine on a stretcher.  After smooth induction of general endotracheal  anesthesia, she was carefully turned into the prone position and all the  bony prominences were appropriately padded and protected.  The back was then  prepped with DuraPrep and draped in sterile fashion.  A midline incision was  created in the lumbodorsal fascia.  This was taken down to expose the first  spinous process which was noted to be that of L3.  This dissection was then  carried superiorly and inferiorly and ultimately, L1 down to S1 was exposed.  There was noted to be no movement between L5 and the sacrum suspecting that  this level had gone on to an arthrodesis.  There was, however, marked bony  overgrowth in the facets and this would need to be taken down in order to do  an adequate decompression when the laminectomy was performed.  After  adequately isolating these areas, the dissection was carried out over the  facet joints and over the transverse processes from L1 down to the sacrum.  These areas were packed off and then after adequately decorticating the  laminar arches as best as could be possible from their positions on the  dorsal spine, a Stille rongeur was used to remove these bones and save the  bone for later use as autograft.  The dissection was then continued by  removing further bone of the laminar arches themselves and performing a  laminectomy.  This was done in  combination with a high speed air drill and a  5 mm dissecting tool.  The bone from this was also saved for later use in  the graft.  The procedure was then continued by first opening the laminar  arches at L4 where there was the most tightness of stenosis and then  proceeding inferiorly down to the sacrum and superiorly up to L1.  L1 was  undercut substantially.  At each level then the dissection was carried out  laterally to expose and undercut the nerve roots.  This was done very  meticulously, so as to protect the nerve root and protect the common dural  tubes in their opening out to the side.  During this procedure, two  inadvertent durotomies had occurred and these were sewn primarily with 6-0  Prolene suture.  A minimal of spinal fluid was lost.  Care was then taken to  make sure that the dissection was carried out over the nerve roots  adequately into their lateral recesses.  With this being performed, the disk  spaces were sounded individually.  There was noted to be very minimal amount  of movement at L4-L5 where degenerative spondylolisthesis had  occurred.  It  was decided at this point then that no interbody grafting would be  performed; however, with the transverse processes exposed, posterolateral  grafting with the patient's own autograft would be performed.  It should be  noted that complete facetectomies occurred at L1-L2 and L2-L3 secondary to  the facets being oriented in a very vertical plane and no other sacrifice  was needed in order to perform an adequate decompression.  Inferiorly, the  facets appeared to remain intact.  Pedicle entry sites were then judged and  chosen by direct visualization and pedicle probes were placed in the  pedicles from L1 down to S1 first on the right side and then by switching  positions over to the left side.  Dr. Carloyn Manner helped during this portion of the  operating room, fluoroscopic localization being used to obtain adequate  visualization of the pedicle probes.  Once these were secured in their  positions, the probes were moved and AlphaTek 6.5 mm x 45 mm sacral screws  were placed in the sacrum. 50 mm screws were placed in L5, 45 mm screws were  placed in L4.  50 mm screws were then placed in each of the L3, L2 and L1.  Screw heads were then aligned and 130 mm precontoured graft was placed  between the screw heads once adequate screw heights could be judged.  The  rods were adjusted multiply to have minimum amount of distraction, so as to  allow for maintenance of the patient's in situ fixation.  Screws were then  placed on the screw caps and these were provisionally tightened.  The rods  were brought medially so as to allow good packing of bone in the lateral  recesses.  These recesses had been previously decorticated and the patient's  autologous bone was prepared through a bone mill after adequately  decorticating the margins and then this bone mill bone was packed into the  lateral gutters from the sacrum all the way up to L1.  A large quantity of bone was obtained via the autograft from  the laminectomy.  Once the bone was  packed, the system was tightened in its final configuration and a 65 mm  cross link was placed over the rods to secure the rods together.  The system  was tightened down  and torqued to the appropriate numbers and then a final  lateral localizing radiograph identified good overall alignment.  There was  a minimal reduction of the spondylolisthesis. With this then, the retractors  were removed.  The wound was closed carefully closing the lumbodorsal fascia  with #1 Vicryl in interrupted fashion and 2-0 Vicryl was used in the  subcutaneous tissues.  A large Hemovac drain was placed in the wound prior  to the dural closure. There was noted to be no cervical spinal fluid leaks  at the closure of this operation.  3-0 Vicryl was then used in the  subcutaneous tissues and the patient was then returned to recovery room in  stable condition.                                               Earleen Newport, M.D.    Drucilla Schmidt  D:  02/01/2004  T:  02/02/2004  Job:  [id number]

## 2011-02-02 NOTE — Consult Note (Signed)
NAMEALEXANDRIA, [name] [name]              ACCOUNT NO.:  [id number]   MEDICAL RECORD NO.:  [id number]           PATIENT TYPE:   LOCATION:                                 FACILITY:   PHYSICIAN:  Orlando Penner. Sevier, M.D. DATE OF BIRTH:  1931-08-12   DATE OF CONSULTATION:  DATE OF DISCHARGE:                                   CONSULTATION   HISTORY:  This 77 year old white female is referred at the courtesy of Dr.  Felipa Eth and his office for evaluation of a smoldering wound of the right  lower extremity.   The patient has medical history is relatively uncomplicated although she is  said to be hypertensive and have hyperlipidemia and has had previous complex  back surgery. She has not had previous difficulties with her feet and prior  this illness, however.   With that background history, the patient began several months ago to have  pain in the plantar aspect of the right foot. She was seen by her physicians  and referred eventually to Dr. Blenda Mounts who established the diagnosis of  plantar fasciitis. When she did not respond to conservative measures, he  subsequently injected the medial aspect of the left heel with a steroid type  injection.   Apparently shortly thereafter, the patient developed redness, swelling and  discomfort in the supramalleolar area on the medial and slightly anterior  aspect of that left distal right lower leg. This was treated as cellulitis  with antibiotics, initially Augmentin but with rapid worsening, and then  this was changed to Cipro. The area apparently has been very weepy and has  developed adherent crusts and simply has failed to make much progress.   She is referred here now for our evaluation and advice.   PAST MEDICAL HISTORY:  Past medical history is notable for those things  mentioned in the present illness otherwise is remarkably benign.   ALLERGIES:  Her she is said to be allergic to [facility] which causes a rash.   MEDICATIONS:  Her regular  medications include furosemide, tramadol, Lyrica,  lisinopril, potassium Toprol XL, baby aspirin, alprazolam, glucosamine  chondroitin sulfate, multivitamin and also OxyContin and Percocet both for  her chronic back pain.   PHYSICAL EXAMINATION:  Examination today is limited to the distal lower  extremities. The left lower extremity is essentially unremarkable and will  not be further discussed. The right lower extremity is mildly edematous with  this edema extending to the knee and is not pitting in nature. There are no  venous cords and no palpable tenderness in the venous locations.  (Incidentally, a previous venous Doppler has been negative). The pulses are  not easily palpable through her edema but on Doppler determination are found  to be triphasic but rather damped at the posterior tibial area, absent at  the dorsalis pedis area but with a reconstituted monophasic pulse more  distally likely in the phalangeal arch. The toes show with no evidence of  any impending ischemia. Monofilament testing shows that she has protective  sensation throughout her feet.   On the medial supramalleolar aspect of the right lower  extremity is an  extensive area of cellulitis measuring some 20 x 25 mm in the center of  which is a crusty lesion, which is at this point ill-defined and seems to  represent primarily just encrusted prior seropurulent drainage.   DISPOSITION:  1.  After some soaking, the crusted areas on the aforementioned wound are      gradually debrided away, and we are left eventually with now a      measurable wound that is partial-thickness measuring 2.8 x 4.6 cm and      approximately 0.2 mm in depth. There does not appear to be any deeper      penetration, and with the patient currently on antibiotics, it is      unclear whether the redness still represents cellulitis or not.  2.  With the wound now cleaned up, it is dressed with application of      Silvadene cream to the entire  reddened area, Silverlon pad over the      ulcer itself covered in turn by a wound contact layer, Sofsorb, and a      very cautious Kerlix Coban wrap in view of her vascular status.  3.  Since no pathogen has ever been cultured from this wound, she is changed      from her current Cipro to doxycycline 100 mg daily and Septra DS twice      daily and is given enough of both of these for 10 days' worth treatment.  4.  She is advised to notify us if there is any suggestion that the dressing      is too tight as might be indicated by discoloration of the toes, pain in      the toes, pain in the wound itself or other such symptoms.  5.  A routine follow-up visit here will be in one week.                                        ___________________________________________  Orlando Penner. London Pepper, M.D.    RES/MEDQ  D:  01/23/2005  T:  01/23/2005  Job:  [id number]   cc:   Hal T. Stoneking, M.D.  [address] [place], [place] [address]  Fax: [phone]

## 2011-02-06 ENCOUNTER — Encounter (INDEPENDENT_AMBULATORY_CARE_PROVIDER_SITE_OTHER): Payer: Medicare Other

## 2011-02-06 DIAGNOSIS — L97909 Non-pressure chronic ulcer of unspecified part of unspecified lower leg with unspecified severity: Secondary | ICD-10-CM

## 2011-02-09 ENCOUNTER — Encounter (HOSPITAL_BASED_OUTPATIENT_CLINIC_OR_DEPARTMENT_OTHER): Payer: Medicare Other | Admitting: Hematology & Oncology

## 2011-02-09 ENCOUNTER — Other Ambulatory Visit: Payer: Self-pay | Admitting: Hematology & Oncology

## 2011-02-09 DIAGNOSIS — D693 Immune thrombocytopenic purpura: Secondary | ICD-10-CM

## 2011-02-09 LAB — CBC WITH DIFFERENTIAL (CANCER CENTER ONLY)
LYMPH%: 18.6 % (ref 14.0–48.0)
MCH: 30.8 pg (ref 26.0–34.0)
MCHC: 34 g/dL (ref 32.0–36.0)
MONO%: 5.8 % (ref 0.0–13.0)
RBC: 4.48 10*6/uL (ref 3.70–5.32)

## 2011-02-10 NOTE — Procedures (Unsigned)
DUPLEX DEEP VENOUS EXAM - LOWER EXTREMITY  INDICATION:  Ulcers.  HISTORY:  Edema:  No. Trauma/Surgery:  No. Pain:  Yes. PE:  No. Previous DVT:  No. Anticoagulants:  No. Other:  DUPLEX EXAM:               CFV   SFV   PopV  PTV    GSV               R  L  R  L  R  L  R   L  R  L Thrombosis    o  o  o  o  o  o  o   o  o  o Spontaneous   +  +  +  +  +  +  +   +  +  + Phasic        +  +  +  +  +  +  +   +  +  + Augmentation  +  +  +  +  +  +  +   +  +  + Compressible  +  +  +  +  +  +  +   +  +  + Competent     +  +  +  +  +  +  +   +  +  +  Legend:  + - yes  o - no  p - partial  D - decreased  IMPRESSION:  No evidence of acute deep vein thrombosis or superficial thrombophlebitis within bilateral lower extremities.  No evidence of venous insufficiency within bilateral lower extremities.   _____________________________ Rosetta Posner, M.D.  OD/MEDQ  D:  02/06/2011  T:  02/06/2011  Job:  [id number]

## 2011-02-14 NOTE — Op Note (Signed)
[name] [name], [name] [name]              ACCOUNT NO.:  [id number]  MEDICAL RECORD NO.:  [id number]           PATIENT TYPE:  LOCATION:                                 FACILITY:  PHYSICIAN:  Lorne Skeens. Annamaria Boots, M.D. DATE OF BIRTH:  28-Nov-1930  DATE OF PROCEDURE:  02/02/2011 DATE OF DISCHARGE:                              OPERATIVE REPORT   PREOPERATIVE DIAGNOSIS:  Exotropia, convergence insufficiency type.  POSTOPERATIVE DIAGNOSIS:  Exotropia, convergence insufficiency type.  PROCEDURE: 1. Left lateral rectus muscle recession, 6.0 mm. 2. Left medial rectus muscle resection, 6.0 mm.  SURGEON:  Lorne Skeens. Sallie Staron, MD  ANESTHESIA:  General (laryngeal mask).  COMPLICATIONS:  None.  DESCRIPTION OF PROCEDURE:  After preoperative evaluation including clearance from the cardiologist regarding the patient's thrombocytopenia, the patient was taken to the operating room where she was identified by me.  General anesthesia was induced without difficulty after placement of appropriate monitors.  The patient was prepped and draped in standard sterile fashion.  A lid speculum was placed in the left eye.  A Swan incision was made temporally in the left eye with [place] scissors over the insertion of the left lateral rectus muscle, approximately 7 mm posterior to the limbus and concentric with the limbus.  Through this incision, the left lateral rectus muscle was engaged on a series of muscle hooks and cleared of its fascial attachments.  The tendon was secured with a double-arm 6-0 Vicryl suture, with a double-locking bite at each border of the muscle, 1 mm from the insertion.  The muscle was disinserted, and was reattached to sclera at a measured distance of 6.0 mm posterior to the original insertion, using direct scleral passes in crossed-swords fashion.  The suture ends were tied securely after the position of muscle had been checked and found to be accurate.  Conjunctiva was closed with two  6-0 plain gut sutures.  A Swan incision was then made nasally over the insertion of the left medial rectus muscle, which was engaged on a series of muscle hooks and cleared of its fascial attachments.  The muscle was spread between two self-retaining hooks.  A 2-mm bite was taken of the center of the muscle belly at a measured distance of 6.0 mm posterior to the insertion, and a knot was tied securely at this location.  The needle at each end of the double-arm suture was passed from the center of the muscle belly to the periphery, parallel to and 6.0 mm posterior to the insertion.  A double-locking bite was placed at each border of the muscle.  A resection clamp was placed on the muscle just anterior to the sutures.  Each pole suture was passed posteriorly to anteriorly through the corresponding end of the muscle stump, then anteriorly to posteriorly near the center of the stump, then posteriorly to anteriorly through the center of the muscle belly, just posterior to the previously placed knot.  The muscle was drawn up to level of the original insertion, and all slack was removed before the suture ends were tied securely. Conjunctiva was closed with two 6-0 plain gut sutures.  TobraDex ointment was placed  in the eye.  The patient was awakened without difficulty and taken to the recovery room in stable condition, having suffered no intraoperative or immediate postop complications.     Lorne Skeens. Annamaria Boots, M.D.     Crista Curb  D:  02/02/2011  T:  02/03/2011  Job:  [id number]  Electronically Signed by Everitt Amber M.D. on 02/14/2011 09:57:40 AM

## 2011-02-19 ENCOUNTER — Encounter (HOSPITAL_BASED_OUTPATIENT_CLINIC_OR_DEPARTMENT_OTHER): Payer: Medicare Other | Attending: Internal Medicine

## 2011-02-19 DIAGNOSIS — D696 Thrombocytopenia, unspecified: Secondary | ICD-10-CM | POA: Insufficient documentation

## 2011-02-19 DIAGNOSIS — Z7982 Long term (current) use of aspirin: Secondary | ICD-10-CM | POA: Insufficient documentation

## 2011-02-19 DIAGNOSIS — L97309 Non-pressure chronic ulcer of unspecified ankle with unspecified severity: Secondary | ICD-10-CM | POA: Insufficient documentation

## 2011-02-19 DIAGNOSIS — L97809 Non-pressure chronic ulcer of other part of unspecified lower leg with unspecified severity: Secondary | ICD-10-CM | POA: Insufficient documentation

## 2011-02-19 DIAGNOSIS — M899 Disorder of bone, unspecified: Secondary | ICD-10-CM | POA: Insufficient documentation

## 2011-02-19 DIAGNOSIS — Z79899 Other long term (current) drug therapy: Secondary | ICD-10-CM | POA: Insufficient documentation

## 2011-02-19 DIAGNOSIS — M949 Disorder of cartilage, unspecified: Secondary | ICD-10-CM | POA: Insufficient documentation

## 2011-02-19 DIAGNOSIS — I872 Venous insufficiency (chronic) (peripheral): Secondary | ICD-10-CM | POA: Insufficient documentation

## 2011-02-19 DIAGNOSIS — Z96649 Presence of unspecified artificial hip joint: Secondary | ICD-10-CM | POA: Insufficient documentation

## 2011-06-26 LAB — CBC
HCT: 34.7 — ABNORMAL LOW
HCT: 37.3
HCT: 33.3 — ABNORMAL LOW
HCT: 33.9 — ABNORMAL LOW
HCT: 41.3
Hemoglobin: 11.9 — ABNORMAL LOW
Hemoglobin: 12.9
Hemoglobin: 11.3 — ABNORMAL LOW
Hemoglobin: 11.6 — ABNORMAL LOW
Hemoglobin: 11.7 — ABNORMAL LOW
MCHC: 34.4
MCHC: 34.5
MCHC: 33.9
MCHC: 34
MCV: 88.4
MCV: 88.6
MCV: 89.5
MCV: 89.9
MCV: 90
Platelets: 107 — ABNORMAL LOW
Platelets: 153
Platelets: 195
Platelets: 212
Platelets: 113 — ABNORMAL LOW
Platelets: 37 — CL
Platelets: 54 — ABNORMAL LOW
Platelets: 87 — ABNORMAL LOW
RBC: 3.92
RBC: 4.01
RBC: 3.68 — ABNORMAL LOW
RBC: 3.72 — ABNORMAL LOW
RDW: 13.2
RDW: 13.2
RDW: 13.6
RDW: 13.1
WBC: 6.1
WBC: 6.4
WBC: 6.6
WBC: 7.3
WBC: 12.1 — ABNORMAL HIGH
WBC: 15.6 — ABNORMAL HIGH
WBC: 27.8 — ABNORMAL HIGH
WBC: 7.7

## 2011-06-26 LAB — BASIC METABOLIC PANEL
BUN: 14
BUN: 17
BUN: 19
BUN: 12
BUN: 24 — ABNORMAL HIGH
CO2: 28
CO2: 29
CO2: 29
CO2: 34 — ABNORMAL HIGH
Calcium: 9
Calcium: 9
Calcium: 9.1
Calcium: 8.9
Calcium: 8.9
Chloride: 100
Chloride: 101
Chloride: 102
Chloride: 101
Chloride: 102
Creatinine, Ser: 1.13
Creatinine, Ser: 1.1
Creatinine, Ser: 1.22 — ABNORMAL HIGH
Creatinine, Ser: 1.29 — ABNORMAL HIGH
Creatinine, Ser: 1.55 — ABNORMAL HIGH
GFR calc Af Amer: 57 — ABNORMAL LOW
GFR calc Af Amer: 40 — ABNORMAL LOW
GFR calc Af Amer: 49 — ABNORMAL LOW
GFR calc Af Amer: 52 — ABNORMAL LOW
GFR calc Af Amer: 59 — ABNORMAL LOW
GFR calc non Af Amer: 33 — ABNORMAL LOW
GFR calc non Af Amer: 36 — ABNORMAL LOW
GFR calc non Af Amer: 47 — ABNORMAL LOW
GFR calc non Af Amer: 47 — ABNORMAL LOW
GFR calc non Af Amer: 33 — ABNORMAL LOW
GFR calc non Af Amer: 40 — ABNORMAL LOW
GFR calc non Af Amer: 49 — ABNORMAL LOW
Glucose, Bld: 105 — ABNORMAL HIGH
Glucose, Bld: 106 — ABNORMAL HIGH
Glucose, Bld: 117 — ABNORMAL HIGH
Glucose, Bld: 110 — ABNORMAL HIGH
Potassium: 4
Potassium: 4
Potassium: 4.5
Potassium: 3.6
Potassium: 3.9
Potassium: 4.4
Sodium: 135
Sodium: 138
Sodium: 140
Sodium: 138
Sodium: 139
Sodium: 140

## 2011-06-26 LAB — URINE MICROSCOPIC-ADD ON

## 2011-06-26 LAB — CULTURE, BLOOD (ROUTINE X 2)

## 2011-06-26 LAB — URINALYSIS, ROUTINE W REFLEX MICROSCOPIC
Bilirubin Urine: NEGATIVE
Ketones, ur: NEGATIVE
Nitrite: NEGATIVE
Nitrite: POSITIVE — AB
Protein, ur: NEGATIVE
Specific Gravity, Urine: 1.018
Urobilinogen, UA: 0.2
pH: 7

## 2011-06-26 LAB — MAGNESIUM: Magnesium: 2

## 2011-06-26 LAB — DIFFERENTIAL
Eosinophils Relative: 0
Monocytes Relative: 1 — ABNORMAL LOW
Neutrophils Relative %: 96 — ABNORMAL HIGH
Smear Review: DECREASED
WBC Morphology: INCREASED

## 2011-06-26 LAB — URINE CULTURE: Culture: NO GROWTH

## 2011-06-26 LAB — WOUND CULTURE

## 2011-07-02 LAB — BASIC METABOLIC PANEL
Calcium: 8 — ABNORMAL LOW
GFR calc non Af Amer: 55 — ABNORMAL LOW
Glucose, Bld: 119 — ABNORMAL HIGH
Sodium: 138

## 2011-07-02 LAB — CBC
Hemoglobin: 9.2 — ABNORMAL LOW
Platelets: 147 — ABNORMAL LOW
Platelets: 96 — ABNORMAL LOW
RDW: 12.7
RDW: 13.3
WBC: 11.4 — ABNORMAL HIGH
WBC: 9.3

## 2011-07-02 LAB — PROTIME-INR: INR: 1.2

## 2011-07-02 LAB — APTT: aPTT: 34

## 2011-07-03 LAB — BASIC METABOLIC PANEL
BUN: 20
CO2: 26
CO2: 29
Calcium: 8.1 — ABNORMAL LOW
Calcium: 8.1 — ABNORMAL LOW
Calcium: 8.4
Creatinine, Ser: 1.19
Creatinine, Ser: 1.38 — ABNORMAL HIGH
GFR calc Af Amer: 45 — ABNORMAL LOW
GFR calc Af Amer: >60
GFR calc non Af Amer: 37 — ABNORMAL LOW
GFR calc non Af Amer: 44 — ABNORMAL LOW
GFR calc non Af Amer: 50 — ABNORMAL LOW
Glucose, Bld: 124 — ABNORMAL HIGH
Glucose, Bld: 141 — ABNORMAL HIGH
Glucose, Bld: 148 — ABNORMAL HIGH
Potassium: 3.4 — ABNORMAL LOW
Sodium: 138
Sodium: 142
Sodium: 143

## 2011-07-03 LAB — CROSSMATCH
ABO/RH(D): A POS
Antibody Screen: NEGATIVE
Antibody Screen: NEGATIVE

## 2011-07-03 LAB — URINALYSIS, ROUTINE W REFLEX MICROSCOPIC
Hgb urine dipstick: NEGATIVE
Nitrite: POSITIVE — AB
Specific Gravity, Urine: 1.013
Urobilinogen, UA: 0.2

## 2011-07-03 LAB — DIFFERENTIAL
Eosinophils Absolute: 0.2
Lymphs Abs: 1.9
Monocytes Relative: 5
Neutrophils Relative %: 72

## 2011-07-03 LAB — CBC
HCT: 20.3 — ABNORMAL LOW
HCT: 33.7 — ABNORMAL LOW
Hemoglobin: 10 — ABNORMAL LOW
Hemoglobin: 11.6 — ABNORMAL LOW
Hemoglobin: 7.1 — CL
MCHC: 34
MCHC: 34.2
MCHC: 34.4
Platelets: 51 — ABNORMAL LOW
Platelets: 56 — ABNORMAL LOW
Platelets: 66 — ABNORMAL LOW
RBC: 2.19 — ABNORMAL LOW
RDW: 12.5
RDW: 12.6
RDW: 12.6
RDW: 12.9
WBC: 11.6 — ABNORMAL HIGH
WBC: 14.7 — ABNORMAL HIGH

## 2011-07-03 LAB — PREPARE PLATELET PHERESIS

## 2011-07-03 LAB — URINE CULTURE

## 2011-07-03 LAB — POCT I-STAT 4, (NA,K, GLUC, HGB,HCT)
HCT: 32 — ABNORMAL LOW
HCT: 39
Hemoglobin: 10.9 — ABNORMAL LOW
Hemoglobin: 13.3
Potassium: 3.5
Sodium: 137

## 2011-07-03 LAB — COMPREHENSIVE METABOLIC PANEL
ALT: 14
AST: 18
Calcium: 9.2
GFR calc Af Amer: 55 — ABNORMAL LOW
Glucose, Bld: 91
Sodium: 137
Total Protein: 7.3

## 2011-07-03 LAB — URINE MICROSCOPIC-ADD ON

## 2011-07-03 LAB — PROTIME-INR: INR: 1.1

## 2011-09-20 ENCOUNTER — Encounter (HOSPITAL_BASED_OUTPATIENT_CLINIC_OR_DEPARTMENT_OTHER): Payer: Medicare Other | Attending: Internal Medicine

## 2011-09-20 DIAGNOSIS — I872 Venous insufficiency (chronic) (peripheral): Secondary | ICD-10-CM | POA: Insufficient documentation

## 2011-09-20 DIAGNOSIS — L97809 Non-pressure chronic ulcer of other part of unspecified lower leg with unspecified severity: Secondary | ICD-10-CM | POA: Insufficient documentation

## 2011-09-20 NOTE — Progress Notes (Signed)
[facility]  NAME:  [name] [name], [name] [name]              ACCOUNT NO.:  [id number]  MEDICAL RECORD NO.:  [id number]      DATE OF BIRTH:  09-24-1930  PHYSICIAN:  Ricard Dillon, M.D.      VISIT DATE:                                  OFFICE VISIT   HISTORY:  This is a 78 year old woman who had previously been seen here in this clinic earlier this year for evaluation of stasis ulcerations on her legs.  These had actually healed out by 6/11,  and she was transferred to graded pressure stockings 20-30 mmHg.  She lives in the assisted living, part of the [facility] home complex.  She comes back with a new wound on the right leg.  Similar to the last time this started a blister and opened.  She has not had a fever, any other particular problems.  She has not complained of claudication, although I think her overall activity level is probably minimal. Looking through her previous records, did not really show arterial evaluation.  Past medical history includes peripheral vascular disease, hypertension, osteopenia, thrombocytopenia, degenerative joint disease.  PAST SURGICAL HISTORY:  Lumbar disk operation in 2005 and right total hip replacement in 2008.  Medication list is reviewed and 10 includes: 1. Gabapentin 800 four times a day. 2. Oxycodone 15 q.4 p.r.n. 3. MS Contin 100 twice a day. 4. Lasix 80 mg 1 twice a day. 5. K-Dur 8 mEq twice daily. 6. Lisinopril 40 daily, apparently currently on hold. 7. Lovastatin 10 daily. 8. Metoprolol 75 twice a day. 9. Fluticasone nasal spray.  SOCIAL HISTORY:  As mentioned, she lives in the assisted living, part of the [facility].  She uses a walker to ambulate.  REVIEW OF SYSTEMS:  RESPIRATORY:  She does not describe exertional shortness of breath or cough.  CARDIAC:  No exertional chest pain. EXTREMITIES:  She does not describe claudication, however she has had long-standing severe venous stasis.  PHYSICAL EXAMINATION:  VITAL  SIGNS:  Her temperature is 97.8, pulse 67, respirations 18, blood pressure 160/77. RESPIRATORY:  She was kyphotic, but is otherwise clear air entry with no crackles or wheezes. Cardiac:  Heart sounds were normal.  There was no evidence of congestive heart failure. EXTREMITIES:  She has severe bilateral venous stasis.  I cannot feel her pulses in the dorsalis pedis or the posterior tibial on the right, although there is a fair amount of edema here.  WOUND EXAM:  There are 2 wounds here both on the right anterolateral lower extremity.  The more substantial wound is proximal and measures 4.4 x 4 x 0.1, there was a smaller wound more laterally and inferiorly measuring 0.7 x 0.6 x 0.1.  Neither of these appear to be particularly infected.  There was no cultures done.  IMPRESSION:  Severe venous stasis disease with venous stasis ulcerations as described.  We applied silver alginate, Unna, Kerlix, and a Coban. She is to leave these on for a week.  She is familiar with the routine in the clinic.  Although, I was a bit concerned about the Unna wrap, given the difficulty feeling her pulses, she appeared to tolerate this well last time.  I have referred her for full arterial evaluation.  ______________________________ Ricard Dillon, M.D.     MGR/MEDQ  D:  09/20/2011  T:  09/20/2011  Job:  [id number]

## 2011-10-25 ENCOUNTER — Encounter (HOSPITAL_BASED_OUTPATIENT_CLINIC_OR_DEPARTMENT_OTHER): Payer: Medicare Other | Attending: Internal Medicine

## 2011-10-25 DIAGNOSIS — I872 Venous insufficiency (chronic) (peripheral): Secondary | ICD-10-CM | POA: Insufficient documentation

## 2011-10-25 DIAGNOSIS — L97809 Non-pressure chronic ulcer of other part of unspecified lower leg with unspecified severity: Secondary | ICD-10-CM | POA: Insufficient documentation

## 2011-11-22 ENCOUNTER — Encounter (HOSPITAL_BASED_OUTPATIENT_CLINIC_OR_DEPARTMENT_OTHER): Payer: Medicare Other | Attending: Internal Medicine

## 2011-11-22 DIAGNOSIS — L97809 Non-pressure chronic ulcer of other part of unspecified lower leg with unspecified severity: Secondary | ICD-10-CM | POA: Insufficient documentation

## 2011-11-22 DIAGNOSIS — I872 Venous insufficiency (chronic) (peripheral): Secondary | ICD-10-CM | POA: Insufficient documentation

## 2011-12-20 ENCOUNTER — Encounter (HOSPITAL_BASED_OUTPATIENT_CLINIC_OR_DEPARTMENT_OTHER): Payer: Medicare Other | Attending: Internal Medicine

## 2011-12-20 DIAGNOSIS — I872 Venous insufficiency (chronic) (peripheral): Secondary | ICD-10-CM | POA: Insufficient documentation

## 2011-12-20 DIAGNOSIS — L97809 Non-pressure chronic ulcer of other part of unspecified lower leg with unspecified severity: Secondary | ICD-10-CM | POA: Insufficient documentation

## 2012-05-16 ENCOUNTER — Other Ambulatory Visit: Payer: Self-pay | Admitting: Geriatric Medicine

## 2012-05-16 DIAGNOSIS — Z1231 Encounter for screening mammogram for malignant neoplasm of breast: Secondary | ICD-10-CM

## 2012-10-17 ENCOUNTER — Emergency Department (HOSPITAL_COMMUNITY): Payer: Medicare Other

## 2012-10-17 ENCOUNTER — Inpatient Hospital Stay (HOSPITAL_COMMUNITY)
Admission: EM | Admit: 2012-10-17 | Discharge: 2012-10-21 | DRG: 247 | Disposition: A | Discharge status: Home / Self Care | Payer: Medicare Other | Attending: Internal Medicine | Admitting: Internal Medicine

## 2012-10-17 ENCOUNTER — Encounter (HOSPITAL_COMMUNITY): Payer: Self-pay

## 2012-10-17 DIAGNOSIS — I214 Non-ST elevation (NSTEMI) myocardial infarction: Principal | ICD-10-CM | POA: Diagnosis present

## 2012-10-17 DIAGNOSIS — M545 Low back pain, unspecified: Secondary | ICD-10-CM | POA: Diagnosis present

## 2012-10-17 DIAGNOSIS — E785 Hyperlipidemia, unspecified: Secondary | ICD-10-CM | POA: Diagnosis present

## 2012-10-17 DIAGNOSIS — Z79899 Other long term (current) drug therapy: Secondary | ICD-10-CM

## 2012-10-17 DIAGNOSIS — I10 Essential (primary) hypertension: Secondary | ICD-10-CM | POA: Diagnosis present

## 2012-10-17 DIAGNOSIS — Z7982 Long term (current) use of aspirin: Secondary | ICD-10-CM

## 2012-10-17 DIAGNOSIS — Z66 Do not resuscitate: Secondary | ICD-10-CM | POA: Diagnosis present

## 2012-10-17 DIAGNOSIS — I200 Unstable angina: Secondary | ICD-10-CM | POA: Diagnosis present

## 2012-10-17 DIAGNOSIS — M949 Disorder of cartilage, unspecified: Secondary | ICD-10-CM | POA: Diagnosis present

## 2012-10-17 DIAGNOSIS — M549 Dorsalgia, unspecified: Secondary | ICD-10-CM | POA: Diagnosis present

## 2012-10-17 DIAGNOSIS — I2510 Atherosclerotic heart disease of native coronary artery without angina pectoris: Secondary | ICD-10-CM | POA: Diagnosis present

## 2012-10-17 DIAGNOSIS — Z8249 Family history of ischemic heart disease and other diseases of the circulatory system: Secondary | ICD-10-CM

## 2012-10-17 DIAGNOSIS — D696 Thrombocytopenia, unspecified: Secondary | ICD-10-CM | POA: Diagnosis present

## 2012-10-17 DIAGNOSIS — R079 Chest pain, unspecified: Secondary | ICD-10-CM

## 2012-10-17 DIAGNOSIS — G8929 Other chronic pain: Secondary | ICD-10-CM | POA: Diagnosis present

## 2012-10-17 DIAGNOSIS — M1990 Unspecified osteoarthritis, unspecified site: Secondary | ICD-10-CM | POA: Diagnosis present

## 2012-10-17 DIAGNOSIS — R072 Precordial pain: Secondary | ICD-10-CM | POA: Diagnosis present

## 2012-10-17 DIAGNOSIS — I872 Venous insufficiency (chronic) (peripheral): Secondary | ICD-10-CM | POA: Diagnosis present

## 2012-10-17 DIAGNOSIS — M899 Disorder of bone, unspecified: Secondary | ICD-10-CM | POA: Diagnosis present

## 2012-10-17 DIAGNOSIS — I739 Peripheral vascular disease, unspecified: Secondary | ICD-10-CM | POA: Diagnosis present

## 2012-10-17 HISTORY — DX: Varicose veins of unspecified lower extremity with ulcer of unspecified site: L97.909

## 2012-10-17 HISTORY — DX: Other specified disorders of bone density and structure, unspecified site: M85.80

## 2012-10-17 HISTORY — DX: Non-pressure chronic ulcer of unspecified part of unspecified lower leg with unspecified severity: I83.009

## 2012-10-17 HISTORY — DX: Essential (primary) hypertension: I10

## 2012-10-17 HISTORY — DX: Thrombocytopenia, unspecified: D69.6

## 2012-10-17 HISTORY — DX: Hyperlipidemia, unspecified: E78.5

## 2012-10-17 HISTORY — DX: Unspecified osteoarthritis, unspecified site: M19.90

## 2012-10-17 LAB — CBC
HCT: 45.4 % (ref 36.0–46.0)
MCHC: 34.4 g/dL (ref 30.0–36.0)
MCV: 92.5 fL (ref 78.0–100.0)
Platelets: 40 10*3/uL — ABNORMAL LOW (ref 150–400)
RDW: 13.7 % (ref 11.5–15.5)

## 2012-10-17 LAB — BASIC METABOLIC PANEL
BUN: 15 mg/dL (ref 6–23)
Creatinine, Ser: 0.86 mg/dL (ref 0.50–1.10)
GFR calc Af Amer: 73 mL/min — ABNORMAL LOW (ref >90)
GFR calc non Af Amer: 63 mL/min — ABNORMAL LOW (ref >90)

## 2012-10-17 LAB — LIPID PANEL
HDL: 53 mg/dL (ref >39)
Total CHOL/HDL Ratio: 3.3 RATIO
Triglycerides: 108 mg/dL (ref <150)

## 2012-10-17 LAB — TROPONIN I: Troponin I: 0.51 ng/mL (ref <0.30)

## 2012-10-17 MED ORDER — OXYCODONE-ACETAMINOPHEN 5-325 MG PO TABS
1.0000 | ORAL_TABLET | Freq: Once | ORAL | Status: AC
  Administered 2012-10-17: 1 via ORAL
  Filled 2012-10-17: qty 1

## 2012-10-17 MED ORDER — GABAPENTIN 800 MG PO TABS
800.0000 mg | ORAL_TABLET | Freq: Four times a day (QID) | ORAL | Status: DC
  Administered 2012-10-17 – 2012-10-18 (×4): 800 mg via ORAL
  Filled 2012-10-17 (×5): qty 1

## 2012-10-17 MED ORDER — SIMVASTATIN 5 MG PO TABS
5.0000 mg | ORAL_TABLET | Freq: Every day | ORAL | Status: DC
  Filled 2012-10-17: qty 1

## 2012-10-17 MED ORDER — SODIUM CHLORIDE 0.9 % IJ SOLN
3.0000 mL | Freq: Two times a day (BID) | INTRAMUSCULAR | Status: DC
  Administered 2012-10-17 – 2012-10-18 (×2): 3 mL via INTRAVENOUS

## 2012-10-17 MED ORDER — NITROGLYCERIN 2 % TD OINT
0.5000 [in_us] | TOPICAL_OINTMENT | Freq: Four times a day (QID) | TRANSDERMAL | Status: DC
  Administered 2012-10-17 – 2012-10-21 (×15): 0.5 [in_us] via TOPICAL
  Filled 2012-10-17: qty 30

## 2012-10-17 MED ORDER — ASPIRIN 81 MG PO CHEW
324.0000 mg | CHEWABLE_TABLET | Freq: Once | ORAL | Status: DC
  Filled 2012-10-17: qty 4

## 2012-10-17 MED ORDER — ASPIRIN 81 MG PO TABS: 81.0000 mg | ORAL_TABLET | Freq: Every day | ORAL | Status: DC

## 2012-10-17 MED ORDER — SODIUM CHLORIDE 0.9 % IJ SOLN: 3.0000 mL | INTRAMUSCULAR | Status: DC | PRN

## 2012-10-17 MED ORDER — OXYCODONE HCL 5 MG PO TABS
15.0000 mg | ORAL_TABLET | ORAL | Status: DC | PRN
  Administered 2012-10-17 – 2012-10-21 (×14): 15 mg via ORAL
  Filled 2012-10-17 (×3): qty 3
  Filled 2012-10-17: qty 1
  Filled 2012-10-17 (×11): qty 3
  Filled 2012-10-17: qty 2

## 2012-10-17 MED ORDER — IOHEXOL 350 MG/ML SOLN
80.0000 mL | Freq: Once | INTRAVENOUS | Status: AC | PRN
  Administered 2012-10-17: 80 mL via INTRAVENOUS

## 2012-10-17 MED ORDER — VITAMIN B-12 1000 MCG PO TABS
1000.0000 ug | ORAL_TABLET | Freq: Every day | ORAL | Status: DC
  Administered 2012-10-18 – 2012-10-21 (×4): 1000 ug via ORAL
  Filled 2012-10-17 (×5): qty 1

## 2012-10-17 MED ORDER — SODIUM CHLORIDE 0.9 % IV SOLN: 250.0000 mL | INTRAVENOUS | Status: DC | PRN

## 2012-10-17 MED ORDER — CALCIUM CARBONATE-VITAMIN D 500-200 MG-UNIT PO TABS
1.0000 | ORAL_TABLET | Freq: Two times a day (BID) | ORAL | Status: DC
  Administered 2012-10-17 – 2012-10-21 (×8): 1 via ORAL
  Filled 2012-10-17 (×9): qty 1

## 2012-10-17 MED ORDER — FUROSEMIDE 80 MG PO TABS
80.0000 mg | ORAL_TABLET | Freq: Every day | ORAL | Status: DC
  Administered 2012-10-17 – 2012-10-21 (×4): 80 mg via ORAL
  Filled 2012-10-17 (×5): qty 1

## 2012-10-17 MED ORDER — ASPIRIN EC 325 MG PO TBEC
325.0000 mg | DELAYED_RELEASE_TABLET | Freq: Every day | ORAL | Status: DC
  Administered 2012-10-17 – 2012-10-19 (×3): 325 mg via ORAL
  Filled 2012-10-17 (×3): qty 1

## 2012-10-17 MED ORDER — MORPHINE SULFATE ER 100 MG PO TBCR
100.0000 mg | EXTENDED_RELEASE_TABLET | Freq: Two times a day (BID) | ORAL | Status: DC
  Administered 2012-10-17 – 2012-10-20 (×7): 100 mg via ORAL
  Filled 2012-10-17 (×7): qty 1

## 2012-10-17 MED ORDER — VITAMIN C 500 MG PO TABS
500.0000 mg | ORAL_TABLET | Freq: Every day | ORAL | Status: DC
  Administered 2012-10-18 – 2012-10-21 (×4): 500 mg via ORAL
  Filled 2012-10-17 (×4): qty 1

## 2012-10-17 MED ORDER — CALCIUM CARBONATE-VITAMIN D 600-400 MG-UNIT PO TABS: 1.0000 | ORAL_TABLET | Freq: Two times a day (BID) | ORAL | Status: DC

## 2012-10-17 MED ORDER — SODIUM CHLORIDE 0.9 % IJ SOLN: 3.0000 mL | Freq: Two times a day (BID) | INTRAMUSCULAR | Status: DC

## 2012-10-17 MED ORDER — POTASSIUM CHLORIDE ER 8 MEQ PO TBCR
8.0000 meq | EXTENDED_RELEASE_TABLET | Freq: Two times a day (BID) | ORAL | Status: DC
  Administered 2012-10-17 – 2012-10-21 (×8): 8 meq via ORAL
  Filled 2012-10-17 (×11): qty 1

## 2012-10-17 MED ORDER — METOPROLOL TARTRATE 25 MG PO TABS
25.0000 mg | ORAL_TABLET | Freq: Two times a day (BID) | ORAL | Status: DC
  Administered 2012-10-17 – 2012-10-18 (×2): 25 mg via ORAL
  Filled 2012-10-17 (×3): qty 1

## 2012-10-17 MED ORDER — NITROGLYCERIN 0.4 MG SL SUBL
0.4000 mg | SUBLINGUAL_TABLET | SUBLINGUAL | Status: DC | PRN
  Administered 2012-10-17: 0.4 mg via SUBLINGUAL
  Filled 2012-10-17: qty 25

## 2012-10-17 NOTE — H&P (Signed)
PCP:   Mathews Argyle, MD   Chief Complaint:  Chest pain this morning.   HPI: This is a 79 year old female resident of ALF, with known history of HTN, dyslipidemia, PVD, osteopenia, DJD, s/p lumbar disk surgery 2005, chronic low back pain, s/p neurostimulator implantation 2008, s/p right THR 2008, s/p bilateral blepharoplasty 2009, s/p corrective surgery for left eye strabismus 01/2011, LE venous insufficiency, with venous ulcer LLE, treated with Unna boot 09/2009 at wound care center, now healed, chronic thrombocytopenia, under hematologist care, presenting with pain in substernal region, described as a "heaviness"  which began at approximately 11:00 AM on 10/17/12, while she was getting dressed to go to lunch at her assisted living facility. No SOB, no nausea, no diaphoresis or radiation of pain. According to patient, she did go to lunch, but did not finish eating her meal, as the heaviness persisted. She then went over to see the RN at the ALF, and RN called EMS. She was brought to the ED, and received SL NTG en-route, with relief. Total duration of pain was about an hour. According to patient, she has had over the past 2-3 night, "twinges" of similar chest discomfort, lasting form a few minutes to half an hour. She has no prior history of CAD or chest pains.    Allergies:  No Known Allergies   History reviewed. No pertinent past medical history.  Past Surgical History  Procedure Date  . Back surgery   . Hip surgery   . Cholecystectomy   . Eye surgery     Prior to Admission medications   Medication Sig Start Date End Date Taking? Authorizing Provider  aspirin 81 MG tablet Take 81 mg by mouth daily.   Yes Historical Provider, MD  Calcium Carbonate-Vitamin D (CALCARB 600/D PO) Take 2 tablets by mouth daily.   Yes Historical Provider, MD  fluticasone (FLONASE) 50 MCG/ACT nasal spray Place 2 sprays into the nose daily as needed. allergies   Yes Historical Provider, MD  furosemide  (LASIX) 80 MG tablet Take 80 mg by mouth daily.   Yes Historical Provider, MD  gabapentin (NEURONTIN) 800 MG tablet Take 800 mg by mouth 4 (four) times daily.   Yes Historical Provider, MD  glucosamine-chondroitin 500-400 MG tablet Take 2 tablets by mouth daily.   Yes Historical Provider, MD  lovastatin (MEVACOR) 10 MG tablet Take 10 mg by mouth at bedtime.   Yes Historical Provider, MD  metoprolol (LOPRESSOR) 50 MG tablet Take 50 mg by mouth 2 (two) times daily. Takes with 25 mg for total of 75 mg twice daily   Yes Historical Provider, MD  metoprolol tartrate (LOPRESSOR) 25 MG tablet Take 25 mg by mouth 2 (two) times daily. takes with a 50 mg for total of 75 mg twice daily   Yes Historical Provider, MD  morphine (MS CONTIN) 100 MG 12 hr tablet Take 100 mg by mouth 2 (two) times daily.   Yes Historical Provider, MD  Multiple Vitamins-Minerals (CENTRUM SILVER PO) Take 1 tablet by mouth daily.   Yes Historical Provider, MD  naproxen sodium (ANAPROX) 220 MG tablet Take 220 mg by mouth every 4 (four) hours.   Yes Historical Provider, MD  oxyCODONE (ROXICODONE) 15 MG immediate release tablet Take 15 mg by mouth every 4 (four) hours as needed. For pain .  Max 8 tabs in 24 hrs   Yes Historical Provider, MD  potassium chloride (KLOR-CON) 8 MEQ tablet Take 8 mEq by mouth 2 (two) times daily.  Yes Historical Provider, MD  vitamin B-12 (CYANOCOBALAMIN) 1000 MCG tablet Take 1,000 mcg by mouth daily.   Yes Historical Provider, MD  vitamin C (ASCORBIC ACID) 500 MG tablet Take 500 mg by mouth daily.   Yes Historical Provider, MD    Social History: Patient reports that she has never smoked. She does not have any smokeless tobacco history on file. She reports that she does not drink alcohol. Her drug history not on file. She has adopted offspring.   Family History: Mother died at age 55 years, from a anon-cardiac cause, while father died at age 6 years from an MI.   Review of Systems:  As per HPI and chief  complaint. Patent denies fatigue, diminished appetite, weight loss, fever, chills, headache, blurred vision, difficulty in speaking, dysphagia, she does have a long-standing cough, which she feels is inconsequential, but no shortness of breath, orthopnea, paroxysmal nocturnal dyspnea, nausea, diaphoresis, abdominal pain, vomiting, diarrhea, belching, heartburn, hematemesis, melena, dysuria, nocturia, urinary frequency or hematochezia. She has mild chronic bilateral lower extremity swelling, but no pain, or redness. The rest of the systems review is negative.  Physical Exam:  General:  Patient does not appear to be in obvious acute distress. Alert, communicative, fully oriented, talking in complete sentences, not short of breath at rest.  HEENT:  No clinical pallor, no jaundice, no conjunctival injection or discharge. Hydration status is fair.  NECK:  Supple, JVP not seen, no carotid bruits, no palpable lymphadenopathy, no palpable goiter. CHEST:  Clinically clear to auscultation, no wheezes, no crackles. HEART:  Sounds 1 and 2 heard, normal, regular, no murmurs. ABDOMEN:  Moderately obese, soft, non-tender, no palpable organomegaly, no palpable masses, normal bowel sounds. GENITALIA:  Not examined. LOWER EXTREMITIES:  Mild-moderate pitting edema, stasis eczema, palpable peripheral pulses. MUSCULOSKELETAL SYSTEM:  Generalized osteoarthritic changes, otherwise, normal. CENTRAL NERVOUS SYSTEM:  No focal neurologic deficit on gross examination.  Labs on Admission:  Results for orders placed during the hospital encounter of 10/17/12 (from the past 48 hour(s))  CBC     Status: Abnormal   Collection Time   10/17/12  1:31 PM      Component Value Range Comment   WBC 8.0  4.0 - 10.5 K/uL    RBC 4.91  3.87 - 5.11 MIL/uL    Hemoglobin 15.6 (*) 12.0 - 15.0 g/dL    HCT 45.4  36.0 - 46.0 %    MCV 92.5  78.0 - 100.0 fL    MCH 31.8  26.0 - 34.0 pg    MCHC 34.4  30.0 - 36.0 g/dL    RDW 13.7  11.5 - 15.5 %     Platelets 40 (*) 150 - 400 K/uL   BASIC METABOLIC PANEL     Status: Abnormal   Collection Time   10/17/12  1:31 PM      Component Value Range Comment   Sodium 137  135 - 145 mEq/L    Potassium 3.5  3.5 - 5.1 mEq/L    Chloride 99  96 - 112 mEq/L    CO2 25  19 - 32 mEq/L    Glucose, Bld 145 (*) 70 - 99 mg/dL    BUN 15  6 - 23 mg/dL    Creatinine, Ser 0.86  0.50 - 1.10 mg/dL    Calcium 9.4  8.4 - 10.5 mg/dL    GFR calc non Af Amer 63 (*) >90 mL/min    GFR calc Af Amer 73 (*) >90 mL/min   TROPONIN  I     Status: Normal   Collection Time   10/17/12  1:31 PM      Component Value Range Comment   Troponin I <0.30  <0.30 ng/mL   D-DIMER, QUANTITATIVE     Status: Abnormal   Collection Time   10/17/12  1:31 PM      Component Value Range Comment   D-Dimer, Quant 1.03 (*) 0.00 - 0.48 ug/mL-FEU     Radiological Exams on Admission: *RADIOLOGY REPORT*  Clinical Data: Chest pain.  CHEST - 2 VIEW  Comparison: 06/04/2011  Findings: Heart size and pulmonary vascularity are normal and the  lungs are clear. No acute osseous abnormality. Spinal cord  stimulator in place. Thoracolumbar fusion noted. Severe  degenerative changes of the shoulders, left worse than right.  IMPRESSION:  No acute disease in the chest.  Original Report Authenticated By: Lorriane Shire, M.D.   *RADIOLOGY REPORT*  Clinical Data: Shortness of breath. Chest pain.  CT ANGIOGRAPHY CHEST  Technique: Multidetector CT imaging of the chest using the  standard protocol during bolus administration of intravenous  contrast. Multiplanar reconstructed images including MIPs were  obtained and reviewed to evaluate the vascular anatomy.  Contrast: 55m OMNIPAQUE IOHEXOL 350 MG/ML SOLN  Comparison: 10/17/2012  Findings: No filling defect is identified in the pulmonary arterial  tree to suggest pulmonary embolus.  No specific findings of acute aortic abnormality, although aortic  contrast is very dilute.  Interventricular septum  thickened at 2 cm, favoring left  ventricular hypertrophy.  Small mediastinal lymph nodes are not pathologically enlarged by  size criteria. Right infrahilar node 0.7 cm in short axis.  No pericardial effusion or cardiomegaly.  Degenerative glenohumeral arthropathy noted, severe on the left,  with extensive subcortical cyst formation and remodeling of the  glenoid in a chronic fashion. Fluid collections around the left  shoulder joint likely represents effusion and regional bursitis.  Prominent thoracic kyphosis noted. Dorsal column stimulator noted  in the proximal and the T6-T8 levels. Facet spurring at the T9-10  and T10-11 levels is observed, probably causing osseous foraminal  stenosis at T9-10 particularly on the left.  There is endplate sclerosis at [id number] pedicle screws at the  T12 level.  IMPRESSION:  1. No findings of embolus or definite acute aortic abnormality.  2. Subsegmental atelectasis medially in the right lower lobe, with  mild right lower lobe bronchiectasis.  3. Markedly severe arthropathy of the left glenohumeral joint,  with remodeling and fragmented spurring of the glenoid, prominent  subcortical cyst formation, large joint effusion, and likely  regional bursitis. Prominent but less striking glenohumeral  arthropathy on the right.  4. Left ventricular hypertrophy.  5. Thoracic kyphosis and spondylosis, with the facet spurring  likely causing osseous foraminal stenosis at T9-10.  6. Dorsal column stimulator noted.  7. Mild atelectasis medially in the right lower lobe with the  right lower lobe cylindrical bronchiectasis.  Original Report Authenticated By: WVan Clines M.D.   Assessment/Plan Active Problems:    1. Chest pain: Patient presented with retrosternal discomfort, which lasted at least 1 hour, until relieved by SL NTG, and which was preceded by 2-3 nights of similar fleeting chest discomfort. This is worrisome for unstable angina, given  age, and history of HTN and dyslipidemia. 12-lead EKG shows anterior old Q-waves, as well as T-wave inversion, but unfortunately, there is no old EKG for comparison. Although D-Dimer is elevated at 1.03, CTA is devoid of PE. Patient is currently pain-free, so will place on  Nitro-paste, start low dose ASA. Patient has significant thrombocytopenia, so am reluctant to start anticoagulants at this time.  Will consult Cardiologist for risk stratification.  2. HTN (hypertension): BP is mildly elevated at this time, but should respond to Nitropaste.  3. Dyslipidemia: Will continue Statin and check Lipid profile.  4. Chronic back pain: Not problematic at this time.  5. Thrombocytopenia: Chronic. Follow CBC.    Further management will depend on clinical course.   Comment: Patient is FULL CODE.    Time Spent on Admission: 1 hour.   Babita Amaker,CHRISTOPHER 10/17/2012, 6:50 PM

## 2012-10-17 NOTE — ED Notes (Signed)
Admitting MD at bedside.

## 2012-10-17 NOTE — Consult Note (Addendum)
CARDIOLOGY CONSULT NOTE  Patient ID: [name] [name] MRN: [id number] DOB/AGE: 12-02-1930 79 y.o.  Admit date: 10/17/2012 Primary Physician Mathews Argyle, MD Primary Cardiologist None Chief Complaint  Chest pain  HPI:  The patient presents for evaluation of chest pain.  She has no prior vascular history. There was a mention of PVD but she does not report this. She doesn't report any cardiovascular testing. I did see an old EKG from 2007 with poor anterior R wave progression consistent with an old anteroseptal infarct.  She lives at an assisted-living facility. She gets around in a motorized scooter. She can ambulate with a walker.  However, she is limited by diffuse joint aches and pains and chronic back pain. She's had for a couple of night she noticed some chest discomfort when she is sleeping. This morning at 11 AM while reading she developed 5/10 dull chest discomfort. She had not had this before. There was no radiation to her neck or to her arms. She did go to eat lunch but her pain persisted. He finally presented to the emergency room where initial enzymes were negative. However, she was found to have T-wave inversions in her anterior leads again without recent EKG for comparison. She was given sublingual nitroglycerin and she thinks her pain improved.  The patient reports no recent shortness of breath, PND or orthopnea. She denies any palpitations, presyncope or syncope. She has had no weight gain or edema.  Past Medical History  Diagnosis Date  . HTN (hypertension)   . DJD (degenerative joint disease)   . Osteopenia   . Dyslipidemia   . Venous ulcer   . Thrombocytopenia     Past Surgical History  Procedure Date  . Back surgery     Lumbar disc  . Hip surgery     R THR  . Eye surgery     Blepharoplasty  . Insertion / placement / revision neurostimulator     No Known Allergies  Prior to Admission medications   Medication Sig Start Date End Date Taking? Authorizing  Provider  aspirin 81 MG tablet Take 81 mg by mouth daily.   Yes Historical Provider, MD  Calcium Carbonate-Vitamin D (CALCARB 600/D PO) Take 2 tablets by mouth daily.   Yes Historical Provider, MD  fluticasone (FLONASE) 50 MCG/ACT nasal spray Place 2 sprays into the nose daily as needed. allergies   Yes Historical Provider, MD  furosemide (LASIX) 80 MG tablet Take 80 mg by mouth daily.   Yes Historical Provider, MD  gabapentin (NEURONTIN) 800 MG tablet Take 800 mg by mouth 4 (four) times daily.   Yes Historical Provider, MD  glucosamine-chondroitin 500-400 MG tablet Take 2 tablets by mouth daily.   Yes Historical Provider, MD  lovastatin (MEVACOR) 10 MG tablet Take 10 mg by mouth at bedtime.   Yes Historical Provider, MD  metoprolol (LOPRESSOR) 50 MG tablet Take 50 mg by mouth 2 (two) times daily. Takes with 25 mg for total of 75 mg twice daily   Yes Historical Provider, MD  metoprolol tartrate (LOPRESSOR) 25 MG tablet Take 25 mg by mouth 2 (two) times daily. takes with a 50 mg for total of 75 mg twice daily   Yes Historical Provider, MD  morphine (MS CONTIN) 100 MG 12 hr tablet Take 100 mg by mouth 2 (two) times daily.   Yes Historical Provider, MD  Multiple Vitamins-Minerals (CENTRUM SILVER PO) Take 1 tablet by mouth daily.   Yes Historical Provider, MD  naproxen sodium (ANAPROX) 220  MG tablet Take 220 mg by mouth every 4 (four) hours.   Yes Historical Provider, MD  oxyCODONE (ROXICODONE) 15 MG immediate release tablet Take 15 mg by mouth every 4 (four) hours as needed. For pain .  Max 8 tabs in 24 hrs   Yes Historical Provider, MD  potassium chloride (KLOR-CON) 8 MEQ tablet Take 8 mEq by mouth 2 (two) times daily.   Yes Historical Provider, MD  vitamin B-12 (CYANOCOBALAMIN) 1000 MCG tablet Take 1,000 mcg by mouth daily.   Yes Historical Provider, MD  vitamin C (ASCORBIC ACID) 500 MG tablet Take 500 mg by mouth daily.   Yes Historical Provider, MD     (Not in a hospital admission) Family  History  Problem Relation Age of Onset  . CAD Father 76    Vague history     History   Social History  . Marital Status: Divorced    Spouse Name: N/A    Number of Children: N/A  . Years of Education: N/A   Occupational History  . Not on file.   Social History Main Topics  . Smoking status: Never Smoker   . Smokeless tobacco: Not on file  . Alcohol Use: No  . Drug Use:   . Sexually Active:    Other Topics Concern  . Not on file   Social History Narrative   Lives at [facility].  Two adopted children. Divorced.       ROS:  As stated in the HPI and negative for all other systems.  Physical Exam: Blood pressure 161/58, pulse 66, temperature 97.9 F (36.6 C), temperature source Oral, resp. rate 9, SpO2 97.00%.  GENERAL:  Well appearing HEENT:  Pupils equal round and reactive, fundi not visualized, oral mucosa unremarkable NECK:  No jugular venous distention, waveform within normal limits, carotid upstroke brisk and symmetric, no bruits, no thyromegaly LYMPHATICS:  No cervical, inguinal adenopathy LUNGS:  Clear to auscultation bilaterally BACK:  No CVA tenderness CHEST:  Unremarkable HEART:  PMI not displaced or sustained,S1 and S2 within normal limits, no S3, positive S4, no clicks, no rubs, no murmurs ABD:  Flat, positive bowel sounds normal in frequency in pitch, no bruits, no rebound, no guarding, no midline pulsatile mass, no hepatomegaly, no splenomegaly, obese EXT:  2 plus pulses throughout, trace edema, no cyanosis no clubbing, chronic venous stasis changes SKIN:  No rashes no nodules,  NEURO:  Cranial nerves II through XII grossly intact, motor grossly intact throughout PSYCH:  Cognitively intact, oriented to person place and time   Labs: Lab Results  Component Value Date   BUN 15 10/17/2012   Lab Results  Component Value Date   CREATININE 0.86 10/17/2012   Lab Results  Component Value Date   NA 137 10/17/2012   K 3.5 10/17/2012   CL 99 10/17/2012   CO2 25  10/17/2012   Lab Results  Component Value Date   TROPONINI <0.30 10/17/2012   Lab Results  Component Value Date   WBC 8.0 10/17/2012   HGB 15.6* 10/17/2012   HCT 45.4 10/17/2012   MCV 92.5 10/17/2012   PLT 40* 10/17/2012       Radiology:  Chest CT:  1. No findings of embolus or definite acute aortic abnormality.  2. Subsegmental atelectasis medially in the right lower lobe, with  mild right lower lobe bronchiectasis.  3. Markedly severe arthropathy of the left glenohumeral joint,  with remodeling and fragmented spurring of the glenoid, prominent  subcortical cyst formation, large joint  effusion, and likely  regional bursitis. Prominent but less striking glenohumeral  arthropathy on the right.  4. Left ventricular hypertrophy.  5. Thoracic kyphosis and spondylosis, with the facet spurring  likely causing osseous foraminal stenosis at T9-10.  6. Dorsal column stimulator noted.  7. Mild atelectasis medially in the right lower lobe with the  right lower lobe cylindrical bronchiectasis.   EKG:NSR, rate 75,  LAD, QTc prolonged, possible old anterior MI with anterior T wave inversion consistent with ischemia.   ASSESSMENT AND PLAN:   Chest pain: The patient's chest pain has some typical and some atypical features. She does have T-wave inversion without a recent EKG for comparison. She will have enzymes cycled. I would manage with heparin and aspirin. I will check an echocardiogram this weekend. I would likely proceed with noninvasive testing provided the enzymes are negative.  HTN: Blood pressure was labile in the emergency room.  I will likely add Norvasc if this pressure trends upward.  Dyslipidemia: For now she'll continue her previous. Goal drug therapy will be based on presence or absence of coronary disease.   SignedMinus Breeding 10/17/2012, 8:05 PM

## 2012-10-17 NOTE — Progress Notes (Signed)
CRITICAL VALUE ALERT  Critical value received:  Trop 0.51  Date of notification:  10/17/12  Time of notification:  2140  Critical value read back:yes  Nurse who received alert:  Estelle Grumbles, RN  MD notified (1st page):  [facility] on-call (responded, told to notify consulting cardiology MD)  Time of first page:  2145  MD notified (2nd page): Dr. Percival Spanish  Time of second page: 2155  Responding MD:  Dr. Percival Spanish  Time MD responded:  2200  MD notified of positive troponin and notified of pharmacy's concern about starting heparin drip with platelets at 40K/uL. Order given to not start the heparin, message relayed to pharmacy. Pt currently not having chest pain, resting comfortably. Will continue to monitor.  Estelle Grumbles, RN

## 2012-10-17 NOTE — ED Notes (Signed)
Pt reports relief from nitro and aspirin given en route. Reports 2/10 central CP. Denies radiation. Denies N/V/weakness. AO x4.

## 2012-10-17 NOTE — ED Notes (Signed)
Per EMS: Pt from assisted living home reporting central CP starting at 1220; denies radiation. Denies N/V/weakness. 12 lead unremarkable, ST. Pt AO x4. 160/88. 108. 18 RR. 98% RA. Hx: HTN Pt given 324 aspirin and 1 nitro with some relief.

## 2012-10-17 NOTE — ED Provider Notes (Signed)
History     CSN: [id number]  Arrival date & time 10/17/12  66   First MD Initiated Contact with Patient 10/17/12 1326      Chief Complaint  Patient presents with  . Chest Pain    (Consider location/radiation/quality/duration/timing/severity/associated sxs/prior treatment) HPI Pt presents with c/o pain in substernal region.  She states it began approx 11am while she was getting dressed to go to lunch at her assisted living facility.  No sob, no nausea, no diaphoresis or radiation of pain.  She states she has not had pain like this before.  No leg swelling.  She does have some cough which is productive, but she states this is chronic and unchanged.  No fever/chills.  She states nitroglycerin helped pain but it is still present at this time.  Also received ASA prior to arrival via EMS.  There are no other associated systemic symptoms, there are no other alleviating or modifying factors.   History reviewed. No pertinent past medical history.  Past Surgical History  Procedure Date  . Back surgery   . Hip surgery   . Cholecystectomy   . Eye surgery     History reviewed. No pertinent family history.  History  Substance Use Topics  . Smoking status: Never Smoker   . Smokeless tobacco: Not on file  . Alcohol Use: No    OB History    Grav Para Term Preterm Abortions TAB SAB Ect Mult Living                  Review of Systems ROS reviewed and all otherwise negative except for mentioned in HPI  Allergies  Review of patient's allergies indicates no known allergies.  Home Medications   Current Outpatient Rx  Name  Route  Sig  Dispense  Refill  . ASPIRIN 81 MG PO TABS   Oral   Take 81 mg by mouth daily.         Marland Kitchen CALCARB 600/D PO   Oral   Take 2 tablets by mouth daily.         Marland Kitchen FLUTICASONE PROPIONATE 50 MCG/ACT NA SUSP   Nasal   Place 2 sprays into the nose daily as needed. allergies         . FUROSEMIDE 80 MG PO TABS   Oral   Take 80 mg by mouth daily.          Marland Kitchen GABAPENTIN 800 MG PO TABS   Oral   Take 800 mg by mouth 4 (four) times daily.         Marland Kitchen GLUCOSAMINE-CHONDROITIN 500-400 MG PO TABS   Oral   Take 2 tablets by mouth daily.         Marland Kitchen LOVASTATIN 10 MG PO TABS   Oral   Take 10 mg by mouth at bedtime.         Marland Kitchen METOPROLOL TARTRATE 50 MG PO TABS   Oral   Take 50 mg by mouth 2 (two) times daily. Takes with 25 mg for total of 75 mg twice daily         . METOPROLOL TARTRATE 25 MG PO TABS   Oral   Take 25 mg by mouth 2 (two) times daily. takes with a 50 mg for total of 75 mg twice daily         . MORPHINE SULFATE ER 100 MG PO TBCR   Oral   Take 100 mg by mouth 2 (two) times daily.         Marland Kitchen  CENTRUM SILVER PO   Oral   Take 1 tablet by mouth daily.         Marland Kitchen NAPROXEN SODIUM 220 MG PO TABS   Oral   Take 220 mg by mouth every 4 (four) hours.         . OXYCODONE HCL 15 MG PO TABS   Oral   Take 15 mg by mouth every 4 (four) hours as needed. For pain .  Max 8 tabs in 24 hrs         . POTASSIUM CHLORIDE ER 8 MEQ PO TBCR   Oral   Take 8 mEq by mouth 2 (two) times daily.         Marland Kitchen VITAMIN B-12 1000 MCG PO TABS   Oral   Take 1,000 mcg by mouth daily.         Marland Kitchen VITAMIN C 500 MG PO TABS   Oral   Take 500 mg by mouth daily.           BP 176/127  Pulse 78  Temp 97.9 F (36.6 C) (Oral)  Resp 20  SpO2 94% Vitals reviewed Physical Exam Physical Examination: General appearance - alert, well appearing, and in no distress Mental status - alert, oriented to person, place, and time Eyes - no conjunctival injection, no scleral icterus Mouth - mucous membranes moist, pharynx normal without lesions Chest - clear to auscultation, no wheezes, rales or rhonchi, symmetric air entry Heart - normal rate, regular rhythm, normal S1, S2, no murmurs, rubs, clicks or gallops Abdomen - soft, nontender, nondistended, no masses or organomegaly Extremities - peripheral pulses normal, no pedal edema, no clubbing or  cyanosis Skin - normal coloration and turgor, no rashes  ED Course  Procedures (including critical care time)   Date: 10/17/2012  Rate: 75  Rhythm: normal sinus rhythm  QRS Axis: left  Intervals: QT prolonged  ST/T Wave abnormalities: inverted t waves anteriorly  Conduction Disutrbances:none  Narrative Interpretation:   Old EKG Reviewed: none available  6:06 PM  D/w Dr. Blenda Nicely, [facility] for admission.  He will see patient in the ED.    Labs Reviewed  CBC - Abnormal; Notable for the following:    Hemoglobin 15.6 (*)     Platelets 40 (*)     All other components within normal limits  BASIC METABOLIC PANEL - Abnormal; Notable for the following:    Glucose, Bld 145 (*)     GFR calc non Af Amer 63 (*)     GFR calc Af Amer 73 (*)     All other components within normal limits  D-DIMER, QUANTITATIVE - Abnormal; Notable for the following:    D-Dimer, Quant 1.03 (*)     All other components within normal limits  TROPONIN I   Dg Chest 2 View  10/17/2012  *RADIOLOGY REPORT*  Clinical Data: Chest pain.  CHEST - 2 VIEW  Comparison: 06/04/2011  Findings: Heart size and pulmonary vascularity are normal and the lungs are clear.  No acute osseous abnormality.  Spinal cord stimulator in place.  Thoracolumbar fusion noted.  Severe degenerative changes of the shoulders, left worse than right.  IMPRESSION: No acute disease in the chest.   Original Report Authenticated By: Lorriane Shire, M.D.    Ct Angio Chest Pe W/cm &/or Wo Cm  10/17/2012  *RADIOLOGY REPORT*  Clinical Data: Shortness of breath.  Chest pain.  CT ANGIOGRAPHY CHEST  Technique:  Multidetector CT imaging of the chest using the standard protocol  during bolus administration of intravenous contrast. Multiplanar reconstructed images including MIPs were obtained and reviewed to evaluate the vascular anatomy.  Contrast: 61m OMNIPAQUE IOHEXOL 350 MG/ML SOLN  Comparison: 10/17/2012  Findings: No filling defect is identified in the  pulmonary arterial tree to suggest pulmonary embolus.  No specific findings of acute aortic abnormality, although aortic contrast is very dilute.  Interventricular septum thickened at 2 cm, favoring left ventricular hypertrophy.  Small mediastinal lymph nodes are not pathologically enlarged by size criteria.  Right infrahilar node 0.7 cm in short axis.  No pericardial effusion or cardiomegaly.  Degenerative glenohumeral arthropathy noted, severe on the left, with extensive subcortical cyst formation and remodeling of the glenoid in a chronic fashion. Fluid collections around the left shoulder joint likely represents effusion and regional bursitis.  Prominent thoracic kyphosis noted.  Dorsal column stimulator noted in the proximal and the T6-T8 levels.  Facet spurring at the T9-10 and T10-11 levels is observed, probably causing osseous foraminal stenosis at T9-10 particularly on the left.  There is endplate sclerosis at TJ33-54with pedicle screws at the T12 level.  IMPRESSION:  1.  No findings of embolus or definite acute aortic abnormality. 2.  Subsegmental atelectasis medially in the right lower lobe, with mild right lower lobe bronchiectasis. 3.  Markedly severe arthropathy of the left glenohumeral joint, with remodeling and fragmented spurring of the glenoid, prominent subcortical cyst formation, large joint effusion, and likely regional bursitis.  Prominent but less striking glenohumeral arthropathy on the right. 4.  Left ventricular hypertrophy. 5.  Thoracic kyphosis and spondylosis, with the facet spurring likely causing osseous foraminal stenosis at T9-10.  6.  Dorsal column stimulator noted. 7.  Mild atelectasis medially in the right lower lobe with the right lower lobe cylindrical bronchiectasis.   Original Report Authenticated By: WVan Clines M.D.      1. Chest pain       MDM  Pt presenting with c/o chest pain.  She is chest pain free in the ED after nitroglycerin. Her d-dimer was  elevated, so ct angio of chest obtained.  No PE.  EKG shows LVH and t wave inversions that may be due to LVH or could possibly be ischemic- no prior EKG for comparison.  Pt to be admitted to [facility].         MThreasa Beards MD 10/17/12 [id number]

## 2012-10-18 DIAGNOSIS — D696 Thrombocytopenia, unspecified: Secondary | ICD-10-CM | POA: Diagnosis present

## 2012-10-18 DIAGNOSIS — I359 Nonrheumatic aortic valve disorder, unspecified: Secondary | ICD-10-CM

## 2012-10-18 DIAGNOSIS — I200 Unstable angina: Secondary | ICD-10-CM | POA: Diagnosis present

## 2012-10-18 DIAGNOSIS — I214 Non-ST elevation (NSTEMI) myocardial infarction: Secondary | ICD-10-CM

## 2012-10-18 LAB — CBC
HCT: 43.1 % (ref 36.0–46.0)
MCH: 32 pg (ref 26.0–34.0)
MCHC: 34.6 g/dL (ref 30.0–36.0)
MCV: 91.6 fL (ref 78.0–100.0)
MCV: 92.5 fL (ref 78.0–100.0)
Platelets: 43 10*3/uL — ABNORMAL LOW (ref 150–400)
RBC: 4.62 MIL/uL (ref 3.87–5.11)
RDW: 13.8 % (ref 11.5–15.5)
WBC: 7.2 10*3/uL (ref 4.0–10.5)

## 2012-10-18 LAB — COMPREHENSIVE METABOLIC PANEL
Albumin: 3.4 g/dL — ABNORMAL LOW (ref 3.5–5.2)
BUN: 16 mg/dL (ref 6–23)
Creatinine, Ser: 1.01 mg/dL (ref 0.50–1.10)
GFR calc Af Amer: 60 mL/min — ABNORMAL LOW (ref >90)
Total Bilirubin: 0.6 mg/dL (ref 0.3–1.2)
Total Protein: 6.3 g/dL (ref 6.0–8.3)

## 2012-10-18 LAB — HEPARIN LEVEL (UNFRACTIONATED): Heparin Unfractionated: 0.23 IU/mL — ABNORMAL LOW (ref 0.30–0.70)

## 2012-10-18 LAB — TROPONIN I: Troponin I: 0.31 ng/mL (ref <0.30)

## 2012-10-18 MED ORDER — ATORVASTATIN CALCIUM 80 MG PO TABS
80.0000 mg | ORAL_TABLET | Freq: Every day | ORAL | Status: DC
  Administered 2012-10-18 – 2012-10-20 (×3): 80 mg via ORAL
  Filled 2012-10-18 (×4): qty 1

## 2012-10-18 MED ORDER — NAPROXEN 500 MG PO TABS
500.0000 mg | ORAL_TABLET | Freq: Once | ORAL | Status: AC
  Administered 2012-10-18: 500 mg via ORAL
  Filled 2012-10-18: qty 1

## 2012-10-18 MED ORDER — METOPROLOL TARTRATE 25 MG PO TABS
25.0000 mg | ORAL_TABLET | Freq: Four times a day (QID) | ORAL | Status: DC
  Administered 2012-10-18 – 2012-10-21 (×11): 25 mg via ORAL
  Filled 2012-10-18 (×15): qty 1

## 2012-10-18 MED ORDER — GABAPENTIN 400 MG PO CAPS
800.0000 mg | ORAL_CAPSULE | Freq: Three times a day (TID) | ORAL | Status: DC
  Administered 2012-10-19 – 2012-10-20 (×8): 800 mg via ORAL
  Filled 2012-10-18 (×13): qty 2

## 2012-10-18 MED ORDER — LORAZEPAM 2 MG/ML IJ SOLN
1.0000 mg | Freq: Once | INTRAMUSCULAR | Status: AC
  Administered 2012-10-18: 1 mg via INTRAVENOUS
  Filled 2012-10-18: qty 1

## 2012-10-18 MED ORDER — HEPARIN (PORCINE) IN NACL 100-0.45 UNIT/ML-% IJ SOLN
1100.0000 [IU]/h | INTRAMUSCULAR | Status: DC
  Administered 2012-10-18: 1050 [IU]/h via INTRAVENOUS
  Filled 2012-10-18 (×2): qty 250

## 2012-10-18 NOTE — Progress Notes (Signed)
  Echocardiogram 2D Echocardiogram has been performed.  Mauricio Po 10/18/2012, 10:58 AM

## 2012-10-18 NOTE — Progress Notes (Signed)
ANTICOAGULATION CONSULT NOTE - Initial Consult  Pharmacy Consult for heparin Indication: chest pain/ACS  No Known Allergies  Patient Measurements: Height: _0  (157.5 cm) Weight: 204 lb 4.1 oz (92.65 kg) IBW/kg (Calculated) : 50.1  Heparin Dosing Weight: 71   Vital Signs: Temp: 97.7 F (36.5 C) (02/01 0548) Temp src: Oral (02/01 0548) BP: 142/76 mmHg (02/01 1055) Pulse Rate: 81  (02/01 1055)  Labs:  Basename 10/18/12 0628 10/18/12 0530 10/18/12 0112 10/17/12 1959 10/17/12 1331  HGB -- 14.3 -- -- 15.6*  HCT -- 42.3 -- -- 45.4  PLT -- 43* -- -- 40*  APTT -- -- -- -- --  LABPROT -- -- -- -- --  INR -- -- -- -- --  HEPARINUNFRC -- -- -- -- --  CREATININE -- 1.01 -- -- 0.86  CKTOTAL -- -- -- -- --  CKMB -- -- -- -- --  TROPONINI 0.51* -- 0.31* 0.51* --    Estimated Creatinine Clearance: 47.8 ml/min (by C-G formula based on Cr of 1.01).   Medical History: Past Medical History  Diagnosis Date  . HTN (hypertension)   . DJD (degenerative joint disease)   . Osteopenia   . Dyslipidemia   . Venous ulcer   . Thrombocytopenia     Medications:  Scheduled:    . aspirin  324 mg Oral Once  . aspirin EC  325 mg Oral Daily  . atorvastatin  80 mg Oral q1800  . calcium-vitamin D  1 tablet Oral BID  . furosemide  80 mg Oral Daily  . gabapentin  800 mg Oral QID  . [COMPLETED] LORazepam  1 mg Intravenous Once  . metoprolol tartrate  25 mg Oral Q6H  . morphine  100 mg Oral BID  . [COMPLETED] naproxen  500 mg Oral Once  . nitroGLYCERIN  0.5 inch Topical Q6H  . [COMPLETED] oxyCODONE-acetaminophen  1 tablet Oral Once  . [COMPLETED] oxyCODONE-acetaminophen  1 tablet Oral Once  . potassium chloride  8 mEq Oral BID  . sodium chloride  3 mL Intravenous Q12H  . sodium chloride  3 mL Intravenous Q12H  . vitamin B-12  1,000 mcg Oral Daily  . vitamin C  500 mg Oral Daily  . [DISCONTINUED] aspirin  81 mg Oral Daily  . [DISCONTINUED] Calcium Carbonate-Vitamin D  1 tablet Oral BID   . [DISCONTINUED] metoprolol tartrate  25 mg Oral BID  . [DISCONTINUED] simvastatin  5 mg Oral q1800    Assessment: 79 yo who was admitted for CP. She has a hx of CAD. IV heparin was ordered last PM but never started because of concern for her thrombocytopenia. Her plt on admission is around 40k.  Dr. Marigene Ehlers ordered heparin again today while pending cath Monday.   Goal of Therapy:  Heparin level 0.3-0.7 units/ml Monitor platelets by anticoagulation protocol: Yes   Plan:  Heparin drip at 1050 units/hr F/u with 8hr HL  Onnie Boer [place] 10/18/2012,12:37 PM

## 2012-10-18 NOTE — Progress Notes (Signed)
[facility] PROGRESS NOTE  KELDA AZAD GMW:[id number] DOB: 03-23-1931 DOA: 10/17/2012 PCP: Mathews Argyle, MD  Assessment/Plan: 1. Unstable angina with positive troponin vs NSTEMI - on iv heparin and aspirin and BB and statin . Cardiology following for possible LHC  2. Chronic back pain - resumed home meds , including long-acting morphine and oxycodone 15 mg every 4 hours 3. Thrombocytopenia - chronic - no further testing planned - follow trend  Code Status: full  Family Communication: patient  Disposition Plan: home   Consultants:  [facility]   Procedures:  .  HPI/Subjective: No further chest pain. She is requesting a newspaper and sugar for her tea.   Objective: Filed Vitals:   10/17/12 1900 10/17/12 2040 10/18/12 0429 10/18/12 0548  BP: 161/58 181/72  134/69  Pulse: 66   82  Temp:  98.3 F (36.8 C)  97.7 F (36.5 C)  TempSrc:  Oral  Oral  Resp: 9   18  Height:  _0  (1.575 m)    Weight:  92.851 kg (204 lb 11.2 oz) 92.65 kg (204 lb 4.1 oz)   SpO2: 97% 93%  94%    Intake/Output Summary (Last 24 hours) at 10/18/12 0727 Last data filed at 10/18/12 0429  Gross per 24 hour  Intake      0 ml  Output   1600 ml  Net  -1600 ml   Filed Weights   10/17/12 2040 10/18/12 0429  Weight: 92.851 kg (204 lb 11.2 oz) 92.65 kg (204 lb 4.1 oz)    Exam:   General:  Alert and oriented x3  Cardiovascular: Regular rate and rhythm without murmurs rubs or gallops  Respiratory: Kyphotic, no crackles  Abdomen: Obese soft nontender  Data Reviewed: Basic Metabolic Panel:  Lab 44/03/47 0530 10/17/12 1331  NA 141 137  K 3.4* 3.5  CL 100 99  CO2 31 25  GLUCOSE 110* 145*  BUN 16 15  CREATININE 1.01 0.86  CALCIUM 9.3 9.4  MG -- --  PHOS -- --   Liver Function Tests:  Lab 10/18/12 0530  AST 21  ALT 9  ALKPHOS 92  BILITOT 0.6  PROT 6.3  ALBUMIN 3.4*   No results found for this basename: LIPASE:5,AMYLASE:5 in the last 168 hours No results  found for this basename: AMMONIA:5 in the last 168 hours CBC:  Lab 10/18/12 0530 10/17/12 1331  WBC 7.2 8.0  NEUTROABS -- --  HGB 14.3 15.6*  HCT 42.3 45.4  MCV 91.6 92.5  PLT 43* 40*   Cardiac Enzymes:  Lab 10/18/12 0112 10/17/12 1959 10/17/12 1331  CKTOTAL -- -- --  CKMB -- -- --  CKMBINDEX -- -- --  TROPONINI 0.31* 0.51* <0.30   BNP (last 3 results) No results found for this basename: PROBNP:3 in the last 8760 hours CBG: No results found for this basename: GLUCAP:5 in the last 168 hours  No results found for this or any previous visit (from the past 240 hour(s)).   Studies: Dg Chest 2 View  10/17/2012  *RADIOLOGY REPORT*  Clinical Data: Chest pain.  CHEST - 2 VIEW  Comparison: 06/04/2011  Findings: Heart size and pulmonary vascularity are normal and the lungs are clear.  No acute osseous abnormality.  Spinal cord stimulator in place.  Thoracolumbar fusion noted.  Severe degenerative changes of the shoulders, left worse than right.  IMPRESSION: No acute disease in the chest.   Original Report Authenticated By: Lorriane Shire, M.D.    Ct Angio Chest Pe W/cm &/  or Wo Cm  10/17/2012  *RADIOLOGY REPORT*  Clinical Data: Shortness of breath.  Chest pain.  CT ANGIOGRAPHY CHEST  Technique:  Multidetector CT imaging of the chest using the standard protocol during bolus administration of intravenous contrast. Multiplanar reconstructed images including MIPs were obtained and reviewed to evaluate the vascular anatomy.  Contrast: 31m OMNIPAQUE IOHEXOL 350 MG/ML SOLN  Comparison: 10/17/2012  Findings: No filling defect is identified in the pulmonary arterial tree to suggest pulmonary embolus.  No specific findings of acute aortic abnormality, although aortic contrast is very dilute.  Interventricular septum thickened at 2 cm, favoring left ventricular hypertrophy.  Small mediastinal lymph nodes are not pathologically enlarged by size criteria.  Right infrahilar node 0.7 cm in short axis.  No  pericardial effusion or cardiomegaly.  Degenerative glenohumeral arthropathy noted, severe on the left, with extensive subcortical cyst formation and remodeling of the glenoid in a chronic fashion. Fluid collections around the left shoulder joint likely represents effusion and regional bursitis.  Prominent thoracic kyphosis noted.  Dorsal column stimulator noted in the proximal and the T6-T8 levels.  Facet spurring at the T9-10 and T10-11 levels is observed, probably causing osseous foraminal stenosis at T9-10 particularly on the left.  There is endplate sclerosis at TH84-69with pedicle screws at the T12 level.  IMPRESSION:  1.  No findings of embolus or definite acute aortic abnormality. 2.  Subsegmental atelectasis medially in the right lower lobe, with mild right lower lobe bronchiectasis. 3.  Markedly severe arthropathy of the left glenohumeral joint, with remodeling and fragmented spurring of the glenoid, prominent subcortical cyst formation, large joint effusion, and likely regional bursitis.  Prominent but less striking glenohumeral arthropathy on the right. 4.  Left ventricular hypertrophy. 5.  Thoracic kyphosis and spondylosis, with the facet spurring likely causing osseous foraminal stenosis at T9-10.  6.  Dorsal column stimulator noted. 7.  Mild atelectasis medially in the right lower lobe with the right lower lobe cylindrical bronchiectasis.   Original Report Authenticated By: WVan Clines M.D.     Scheduled Meds:   . aspirin  324 mg Oral Once  . aspirin EC  325 mg Oral Daily  . calcium-vitamin D  1 tablet Oral BID  . furosemide  80 mg Oral Daily  . gabapentin  800 mg Oral QID  . metoprolol tartrate  25 mg Oral BID  . morphine  100 mg Oral BID  . nitroGLYCERIN  0.5 inch Topical Q6H  . potassium chloride  8 mEq Oral BID  . simvastatin  5 mg Oral q1800  . sodium chloride  3 mL Intravenous Q12H  . sodium chloride  3 mL Intravenous Q12H  . vitamin B-12  1,000 mcg Oral Daily  . vitamin  C  500 mg Oral Daily   Continuous Infusions:   Principal Problem:  *Unstable angina Active Problems:  Chest pain  HTN (hypertension)  Dyslipidemia  Chronic back pain  Precordial pain       Vidur Knust  [facility] Pager [phone] If 8PM-8AM, please contact night-coverage at [email or web address], password [email or web address]2/09/2012, 7:27 AM  LOS: 1 day

## 2012-10-18 NOTE — Progress Notes (Signed)
Utilization Review Completed.  10/18/2012

## 2012-10-18 NOTE — Progress Notes (Addendum)
ANTICOAGULATION CONSULT NOTE - Initial Consult  Pharmacy Consult for heparin Indication: chest pain/ACS  No Known Allergies  Patient Measurements: Height: 5' 2" (157.5 cm) Weight: 204 lb 4.1 oz (92.65 kg) IBW/kg (Calculated) : 50.1  Heparin Dosing Weight: 71   Vital Signs: Temp: 97.7 F (36.5 C) (02/01 2036) Temp src: Oral (02/01 2036) BP: 165/84 mmHg (02/01 2036) Pulse Rate: 69  (02/01 2036)  Labs:  Basename 10/18/12 2017 10/18/12 0628 10/18/12 0530 10/18/12 0112 10/17/12 1959 10/17/12 1331  HGB 14.9 -- 14.3 -- -- --  HCT 43.1 -- 42.3 -- -- 45.4  PLT 41* -- 43* -- -- 40*  APTT -- -- -- -- -- --  LABPROT -- -- -- -- -- --  INR -- -- -- -- -- --  HEPARINUNFRC 0.23* -- -- -- -- --  CREATININE -- -- 1.01 -- -- 0.86  CKTOTAL -- -- -- -- -- --  CKMB -- -- -- -- -- --  TROPONINI -- 0.51* -- 0.31* 0.51* --    Estimated Creatinine Clearance: 47.8 ml/min (by C-G formula based on Cr of 1.01).   Medical History: Past Medical History  Diagnosis Date  . HTN (hypertension)   . DJD (degenerative joint disease)   . Osteopenia   . Dyslipidemia   . Venous ulcer   . Thrombocytopenia     Medications:  Scheduled:     . aspirin  324 mg Oral Once  . aspirin EC  325 mg Oral Daily  . atorvastatin  80 mg Oral q1800  . calcium-vitamin D  1 tablet Oral BID  . furosemide  80 mg Oral Daily  . gabapentin  800 mg Oral QID  . [COMPLETED] LORazepam  1 mg Intravenous Once  . metoprolol tartrate  25 mg Oral Q6H  . morphine  100 mg Oral BID  . [COMPLETED] naproxen  500 mg Oral Once  . nitroGLYCERIN  0.5 inch Topical Q6H  . potassium chloride  8 mEq Oral BID  . sodium chloride  3 mL Intravenous Q12H  . sodium chloride  3 mL Intravenous Q12H  . vitamin B-12  1,000 mcg Oral Daily  . vitamin C  500 mg Oral Daily  . [DISCONTINUED] metoprolol tartrate  25 mg Oral BID  . [DISCONTINUED] simvastatin  5 mg Oral q1800    Assessment: 79 yo who was admitted for CP. She has a hx of CAD. IV  heparin was ordered last PM but never started because of concern for her thrombocytopenia. Her plt on admission is around 40k.  Dr. Marigene Ehlers ordered heparin again today while pending cath Monday.  Heparin level subtherapeutic but drawn only 5 hrs after infusion start instead of 8 hrs. Plt is still low, but stable.   Goal of Therapy:  Heparin level 0.3-0.7 units/ml Monitor platelets by anticoagulation protocol: Yes   Plan:  Increase Heparin drip slightly at 1100 units/hr F/u Am labs  Maryanna Shape, PharmD, BCPS  Clinical Pharmacist  Pager: [phone]  10/18/2012,9:41 PM

## 2012-10-18 NOTE — Progress Notes (Signed)
Patient ID: [name] [name], female   DOB: 12/26/1930, 79 y.o.   MRN: [id number]    SUBJECTIVE: No further chest pain.  She continues to have her severe chronic low back pain.      Marland Kitchen aspirin  324 mg Oral Once  . aspirin EC  325 mg Oral Daily  . calcium-vitamin D  1 tablet Oral BID  . furosemide  80 mg Oral Daily  . gabapentin  800 mg Oral QID  . metoprolol tartrate  25 mg Oral BID  . morphine  100 mg Oral BID  . nitroGLYCERIN  0.5 inch Topical Q6H  . potassium chloride  8 mEq Oral BID  . simvastatin  5 mg Oral q1800  . sodium chloride  3 mL Intravenous Q12H  . sodium chloride  3 mL Intravenous Q12H  . vitamin B-12  1,000 mcg Oral Daily  . vitamin C  500 mg Oral Daily  heparin gtt    Filed Vitals:   10/17/12 2040 10/18/12 0429 10/18/12 0548 10/18/12 1055  BP: 181/72  134/69 142/76  Pulse:   82 81  Temp: 98.3 F (36.8 C)  97.7 F (36.5 C)   TempSrc: Oral  Oral   Resp:   18   Height: _0  (1.575 m)     Weight: 204 lb 11.2 oz (92.851 kg) 204 lb 4.1 oz (92.65 kg)    SpO2: 93%  94%     Intake/Output Summary (Last 24 hours) at 10/18/12 1221 Last data filed at 10/18/12 0429  Gross per 24 hour  Intake      0 ml  Output   1600 ml  Net  -1600 ml    LABS: Basic Metabolic Panel:  Basename 10/18/12 0530 10/17/12 1331  NA 141 137  K 3.4* 3.5  CL 100 99  CO2 31 25  GLUCOSE 110* 145*  BUN 16 15  CREATININE 1.01 0.86  CALCIUM 9.3 9.4  MG -- --  PHOS -- --   Liver Function Tests:  Basename 10/18/12 0530  AST 21  ALT 9  ALKPHOS 92  BILITOT 0.6  PROT 6.3  ALBUMIN 3.4*   No results found for this basename: LIPASE:2,AMYLASE:2 in the last 72 hours CBC:  Basename 10/18/12 0530 10/17/12 1331  WBC 7.2 8.0  NEUTROABS -- --  HGB 14.3 15.6*  HCT 42.3 45.4  MCV 91.6 92.5  PLT 43* 40*   Cardiac Enzymes:  Basename 10/18/12 0628 10/18/12 0112 10/17/12 1959  CKTOTAL -- -- --  CKMB -- -- --  CKMBINDEX -- -- --  TROPONINI 0.51* 0.31* 0.51*   BNP: No components  found with this basename: POCBNP:3 D-Dimer:  Basename 10/17/12 1331  DDIMER 1.03*   Hemoglobin A1C: No results found for this basename: HGBA1C in the last 72 hours Fasting Lipid Panel:  Basename 10/17/12 1958  CHOL 175  HDL 53  LDLCALC 100*  TRIG 108  CHOLHDL 3.3  LDLDIRECT --   Thyroid Function Tests: No results found for this basename: TSH,T4TOTAL,FREET3,T3FREE,THYROIDAB in the last 72 hours Anemia Panel: No results found for this basename: VITAMINB12,FOLATE,FERRITIN,TIBC,IRON,RETICCTPCT in the last 72 hours  RADIOLOGY: Dg Chest 2 View  10/17/2012  *RADIOLOGY REPORT*  Clinical Data: Chest pain.  CHEST - 2 VIEW  Comparison: 06/04/2011  Findings: Heart size and pulmonary vascularity are normal and the lungs are clear.  No acute osseous abnormality.  Spinal cord stimulator in place.  Thoracolumbar fusion noted.  Severe degenerative changes of the shoulders, left worse than right.  IMPRESSION:  No acute disease in the chest.   Original Report Authenticated By: Lorriane Shire, M.D.    Ct Angio Chest Pe W/cm &/or Wo Cm  10/17/2012  *RADIOLOGY REPORT*  Clinical Data: Shortness of breath.  Chest pain.  CT ANGIOGRAPHY CHEST  Technique:  Multidetector CT imaging of the chest using the standard protocol during bolus administration of intravenous contrast. Multiplanar reconstructed images including MIPs were obtained and reviewed to evaluate the vascular anatomy.  Contrast: 41m OMNIPAQUE IOHEXOL 350 MG/ML SOLN  Comparison: 10/17/2012  Findings: No filling defect is identified in the pulmonary arterial tree to suggest pulmonary embolus.  No specific findings of acute aortic abnormality, although aortic contrast is very dilute.  Interventricular septum thickened at 2 cm, favoring left ventricular hypertrophy.  Small mediastinal lymph nodes are not pathologically enlarged by size criteria.  Right infrahilar node 0.7 cm in short axis.  No pericardial effusion or cardiomegaly.  Degenerative glenohumeral  arthropathy noted, severe on the left, with extensive subcortical cyst formation and remodeling of the glenoid in a chronic fashion. Fluid collections around the left shoulder joint likely represents effusion and regional bursitis.  Prominent thoracic kyphosis noted.  Dorsal column stimulator noted in the proximal and the T6-T8 levels.  Facet spurring at the T9-10 and T10-11 levels is observed, probably causing osseous foraminal stenosis at T9-10 particularly on the left.  There is endplate sclerosis at TW62-03with pedicle screws at the T12 level.  IMPRESSION:  1.  No findings of embolus or definite acute aortic abnormality. 2.  Subsegmental atelectasis medially in the right lower lobe, with mild right lower lobe bronchiectasis. 3.  Markedly severe arthropathy of the left glenohumeral joint, with remodeling and fragmented spurring of the glenoid, prominent subcortical cyst formation, large joint effusion, and likely regional bursitis.  Prominent but less striking glenohumeral arthropathy on the right. 4.  Left ventricular hypertrophy. 5.  Thoracic kyphosis and spondylosis, with the facet spurring likely causing osseous foraminal stenosis at T9-10.  6.  Dorsal column stimulator noted. 7.  Mild atelectasis medially in the right lower lobe with the right lower lobe cylindrical bronchiectasis.   Original Report Authenticated By: WVan Clines M.D.     PHYSICAL EXAM General: NAD Neck: No JVD, no thyromegaly or thyroid nodule.  Lungs: Clear to auscultation bilaterally with normal respiratory effort. CV: Nondisplaced PMI.  Heart regular S1/S2, no S3/S4, no murmur.  No peripheral edema.  No carotid bruit.  Normal pedal pulses.  Abdomen: Soft, nontender, no hepatosplenomegaly, no distention.  Neurologic: Alert and oriented x 3.  Psych: Normal affect. Extremities: No clubbing or cyanosis.   TELEMETRY: Reviewed telemetry pt in NSR  ASSESSMENT AND PLAN:  79yo with history of HTN and chronic back pain  presented with chest pain, now with evidence for NSTEMI. 1. CAD: Possible NSTEMI.  Mild elevation in TnI.  ECG with anterior T wave inversions.  No further chest pain  - ASA, heparin gtt, atorvastatin 80 - Metoprolol 25 every 6 hours. - Echo - [facility] on Monday.  2. Chronic pain: per primary team.   DLoralie Champagne2/09/2012 12:28 PM

## 2012-10-19 DIAGNOSIS — R079 Chest pain, unspecified: Secondary | ICD-10-CM

## 2012-10-19 DIAGNOSIS — M549 Dorsalgia, unspecified: Secondary | ICD-10-CM

## 2012-10-19 DIAGNOSIS — G8929 Other chronic pain: Secondary | ICD-10-CM

## 2012-10-19 LAB — CBC
MCH: 31.6 pg (ref 26.0–34.0)
MCH: 31.3 pg (ref 26.0–34.0)
MCHC: 34.2 g/dL (ref 30.0–36.0)
MCHC: 34.1 g/dL (ref 30.0–36.0)
MCV: 92.3 fL (ref 78.0–100.0)
MCV: 91.6 fL (ref 78.0–100.0)
Platelets: 49 10*3/uL — ABNORMAL LOW (ref 150–400)
Platelets: 43 10*3/uL — ABNORMAL LOW (ref 150–400)
RDW: 13.7 % (ref 11.5–15.5)
WBC: 7.9 10*3/uL (ref 4.0–10.5)

## 2012-10-19 LAB — BASIC METABOLIC PANEL
BUN: 17 mg/dL (ref 6–23)
Calcium: 9.3 mg/dL (ref 8.4–10.5)
Creatinine, Ser: 0.91 mg/dL (ref 0.50–1.10)
GFR calc Af Amer: 68 mL/min — ABNORMAL LOW (ref >90)
GFR calc non Af Amer: 58 mL/min — ABNORMAL LOW (ref >90)

## 2012-10-19 LAB — MAGNESIUM: Magnesium: 1.8 mg/dL (ref 1.5–2.5)

## 2012-10-19 LAB — PROTIME-INR: INR: 1.09 (ref 0.00–1.49)

## 2012-10-19 MED ORDER — SODIUM CHLORIDE 0.9 % IV SOLN
INTRAVENOUS | Status: DC
  Administered 2012-10-20: 12:00:00 via INTRAVENOUS

## 2012-10-19 MED ORDER — SODIUM CHLORIDE 0.9 % IJ SOLN
3.0000 mL | Freq: Two times a day (BID) | INTRAMUSCULAR | Status: DC
  Administered 2012-10-19: 3 mL via INTRAVENOUS

## 2012-10-19 MED ORDER — ASPIRIN 81 MG PO CHEW
324.0000 mg | CHEWABLE_TABLET | ORAL | Status: AC
  Administered 2012-10-20: 324 mg via ORAL
  Filled 2012-10-19: qty 4

## 2012-10-19 MED ORDER — NAPROXEN 500 MG PO TABS
500.0000 mg | ORAL_TABLET | Freq: Once | ORAL | Status: DC
  Filled 2012-10-19: qty 1

## 2012-10-19 MED ORDER — SODIUM CHLORIDE 0.9 % IJ SOLN: 3.0000 mL | INTRAMUSCULAR | Status: DC | PRN

## 2012-10-19 MED ORDER — HEPARIN (PORCINE) IN NACL 100-0.45 UNIT/ML-% IJ SOLN
1200.0000 [IU]/h | INTRAMUSCULAR | Status: DC
  Administered 2012-10-19: 1200 [IU]/h via INTRAVENOUS
  Filled 2012-10-19 (×3): qty 250

## 2012-10-19 MED ORDER — SODIUM CHLORIDE 0.9 % IV SOLN: 250.0000 mL | INTRAVENOUS | Status: DC | PRN

## 2012-10-19 NOTE — Progress Notes (Signed)
Patient: [name] [name] Date of Encounter: 10/19/2012, 9:15 AM Admit date: 10/17/2012     Subjective  Ms. Dimond denies any recurrent CP. She denies SOB. She has chronic back pain.   Objective  Physical Exam: Vitals: BP 157/78  Pulse 71  Temp 98.7 F (37.1 C) (Oral)  Resp 20  Ht 5' 2" (1.575 m)  Wt 201 lb 11.2 oz (91.491 kg)  BMI 36.89 kg/m2  SpO2 93% General: Well developed, well appearing 79 year old female in no acute distress. Neck: Supple. JVD not elevated. Lungs: Clear bilaterally to auscultation without wheezes, rales, or rhonchi. Breathing is unlabored. Heart: RRR S1 S2 without murmur, rub or gallop.  Abdomen: Soft, non-distended. Extremities: No clubbing or cyanosis. No edema.  Distal pedal pulses are 2+ and equal bilaterally. Neuro: Alert and oriented X 3. Moves all extremities spontaneously. No focal deficits.  Intake/Output:  Intake/Output Summary (Last 24 hours) at 10/19/12 0915 Last data filed at 10/18/12 1626  Gross per 24 hour  Intake      0 ml  Output    401 ml  Net   -401 ml    Inpatient Medications:     . aspirin  324 mg Oral Once  . aspirin EC  325 mg Oral Daily  . atorvastatin  80 mg Oral q1800  . calcium-vitamin D  1 tablet Oral BID  . furosemide  80 mg Oral Daily  . gabapentin  800 mg Oral TID WC & HS  . metoprolol tartrate  25 mg Oral Q6H  . morphine  100 mg Oral BID  . naproxen  500 mg Oral Once  . nitroGLYCERIN  0.5 inch Topical Q6H  . potassium chloride  8 mEq Oral BID  . sodium chloride  3 mL Intravenous Q12H  . sodium chloride  3 mL Intravenous Q12H  . vitamin B-12  1,000 mcg Oral Daily  . vitamin C  500 mg Oral Daily      . heparin 1,100 Units/hr (10/18/12 2209)    Labs:  [facility] 10/19/12 0555 10/18/12 0530  NA 139 141  K 3.5 3.4*  CL 99 100  CO2 31 31  GLUCOSE 105* 110*  BUN 17 16  CREATININE 0.91 1.01  CALCIUM 9.3 9.3  MG 1.8 --  PHOS -- --    Basename 10/18/12 0530  AST 21  ALT 9  ALKPHOS 92    BILITOT 0.6  PROT 6.3  ALBUMIN 3.4*    Basename 10/19/12 0555 10/18/12 2017  WBC 7.9 8.5  NEUTROABS -- --  HGB 14.4 14.9  HCT 42.1 43.1  MCV 92.3 92.5  PLT 49* 41*    Basename 10/18/12 0628 10/18/12 0112 10/17/12 1959 10/17/12 1331  CKTOTAL -- -- -- --  CKMB -- -- -- --  TROPONINI 0.51* 0.31* 0.51* <0.30    Basename 10/17/12 1331  DDIMER 1.03*    Basename 10/17/12 1958  CHOL 175  HDL 53  LDLCALC 100*  TRIG 108  CHOLHDL 3.3    Radiology/Studies: Dg Chest 2 View  10/17/2012  *RADIOLOGY REPORT*  Clinical Data: Chest pain.  CHEST - 2 VIEW  Comparison: 06/04/2011  Findings: Heart size and pulmonary vascularity are normal and the lungs are clear.  No acute osseous abnormality.  Spinal cord stimulator in place.  Thoracolumbar fusion noted.  Severe degenerative changes of the shoulders, left worse than right.  IMPRESSION: No acute disease in the chest.   Original Report Authenticated By: Lorriane Shire, M.D.  Ct Angio Chest Pe W/cm &/or Wo Cm  10/17/2012  *RADIOLOGY REPORT*  Clinical Data: Shortness of breath.  Chest pain.  CT ANGIOGRAPHY CHEST  Technique:  Multidetector CT imaging of the chest using the standard protocol during bolus administration of intravenous contrast. Multiplanar reconstructed images including MIPs were obtained and reviewed to evaluate the vascular anatomy.  Contrast: 18m OMNIPAQUE IOHEXOL 350 MG/ML SOLN  Comparison: 10/17/2012  Findings: No filling defect is identified in the pulmonary arterial tree to suggest pulmonary embolus.  No specific findings of acute aortic abnormality, although aortic contrast is very dilute.  Interventricular septum thickened at 2 cm, favoring left ventricular hypertrophy.  Small mediastinal lymph nodes are not pathologically enlarged by size criteria.  Right infrahilar node 0.7 cm in short axis.  No pericardial effusion or cardiomegaly.  Degenerative glenohumeral arthropathy noted, severe on the left, with extensive subcortical  cyst formation and remodeling of the glenoid in a chronic fashion. Fluid collections around the left shoulder joint likely represents effusion and regional bursitis.  Prominent thoracic kyphosis noted.  Dorsal column stimulator noted in the proximal and the T6-T8 levels.  Facet spurring at the T9-10 and T10-11 levels is observed, probably causing osseous foraminal stenosis at T9-10 particularly on the left.  There is endplate sclerosis at TM01-02with pedicle screws at the T12 level.  IMPRESSION:  1.  No findings of embolus or definite acute aortic abnormality. 2.  Subsegmental atelectasis medially in the right lower lobe, with mild right lower lobe bronchiectasis. 3.  Markedly severe arthropathy of the left glenohumeral joint, with remodeling and fragmented spurring of the glenoid, prominent subcortical cyst formation, large joint effusion, and likely regional bursitis.  Prominent but less striking glenohumeral arthropathy on the right. 4.  Left ventricular hypertrophy. 5.  Thoracic kyphosis and spondylosis, with the facet spurring likely causing osseous foraminal stenosis at T9-10.  6.  Dorsal column stimulator noted. 7.  Mild atelectasis medially in the right lower lobe with the right lower lobe cylindrical bronchiectasis.   Original Report Authenticated By: WVan Clines M.D.     Echocardiogram: Study Conclusions - Left ventricle: The cavity size was normal. Wall thickness was increased in a pattern of moderate LVH. Systolic function was normal. The estimated ejection fraction was in the range of 60% to 65%. Wall motion was normal; there were no regional wall motion abnormalities. Doppler parameters are consistent with abnormal left ventricular relaxation (grade 1 diastolic dysfunction). - Aortic valve: Mild regurgitation. - Mitral valve: No significant regurgitation. - Left atrium: The atrium was mildly dilated. - Right ventricle: The cavity size was normal. Systolic function was normal. -  Pulmonary arteries: No complete TR doppler jet so unable to estimate PA systolic pressure. - Systemic veins: IVC measured 1.9 cm with normal respirophasic variation, suggesting RA pressure 6-10 mmHg.   Telemetry: normal sinus rhythm; 6-beat run NSVT last night ~11:45 PM, asymptomatic    Assessment and Plan  79yo with history of HTN and chronic back pain presented with chest pain, now with evidence for NSTEMI.   1. NSTEMI  Mild elevation in TnI. ECG with anterior T wave inversions. No further chest pain.  - Echo shows normal LV function without WMAs - ASA, heparin gtt, atorvastatin 80  - Metoprolol 25 every 6 hours  [facility]  2. NSVT - Brief, asymptomatic - Continue BB 2. Chronic pain - Per primary team    Signed, EDMISTEN, BROOKE PA-C   I have seen, examined the patient, and reviewed  the above assessment and plan.  Changes to above are made where necessary.  Pt presently comfortable.  I agree with Dr Aundra Dubin that cath is prudent. Risks, benefits, and alternatives to cath were discussed at length with the patient who wishes to proceed.  We will therefore plan for cath in am.  Co Sign: Thompson Grayer, MD 10/19/2012 12:07 PM

## 2012-10-19 NOTE — Progress Notes (Signed)
Pt had 7 bts NSVT on telemetry while lying in bed.  Pt asymptomatic with BP 163/76 HR 74.  Dr. Percival Spanish notified with new orders to check mag level in am.  Strip posted to chart.  Will continue to monitor.

## 2012-10-19 NOTE — Progress Notes (Signed)
ANTICOAGULATION CONSULT NOTE - Follow Up Consult  Pharmacy Consult for heparin Indication: chest pain/ACS  Labs:  Basename 10/19/12 1152 10/19/12 0555 10/18/12 2017 10/18/12 0628 10/18/12 0530 10/18/12 0112 10/17/12 1959 10/17/12 1331  HGB 14.5 14.4 -- -- -- -- -- --  HCT 42.5 42.1 43.1 -- -- -- -- --  PLT 43* 49* 41* -- -- -- -- --  APTT -- -- -- -- -- -- -- --  LABPROT -- -- -- -- -- -- -- --  INR -- -- -- -- -- -- -- --  HEPARINUNFRC 0.31 0.42 0.23* -- -- -- -- --  CREATININE -- 0.91 -- -- 1.01 -- -- 0.86  CKTOTAL -- -- -- -- -- -- -- --  CKMB -- -- -- -- -- -- -- --  TROPONINI -- -- -- 0.51* -- 0.31* 0.51* --    Assessment:  79yo female now therapeutic on heparin after rate increase.  Plt remain low but stable.    Plan: Increase heparin to 1200 units/hr F/u with AM level

## 2012-10-19 NOTE — Progress Notes (Signed)
[facility] PROGRESS NOTE  [name] [name] GFR:[id number] DOB: [birth year] DOA: 10/17/2012 PCP: Mathews Argyle, MD  Assessment/Plan: 1. Unstable angina with positive troponin vs NSTEMI - on iv heparin and aspirin and BB and statin . Cardiology following for possible LHC 10/20/12 2. Chronic back pain - resumed home meds , including long-acting morphine and oxycodone 15 mg every 4 hours, and feeling well. 3. Thrombocytopenia - chronic - no further testing planned - follow trend 4. HTN: - on BB. Lasix,   Code Status: full  Family Communication: patient  Disposition Plan: home   Consultants:  [facility]   Procedures:  .  HPI/Subjective: No further chest pain.  Objective: Filed Vitals:   10/18/12 1718 10/18/12 2036 10/18/12 2357 10/19/12 0549  BP: 186/85 165/84 163/86 157/78  Pulse: 68 69 74 71  Temp:  97.7 F (36.5 C)  98.7 F (37.1 C)  TempSrc:  Oral  Oral  Resp:  20  20  Height:      Weight:    91.491 kg (201 lb 11.2 oz)  SpO2:  93%  93%    Intake/Output Summary (Last 24 hours) at 10/19/12 0924 Last data filed at 10/18/12 1626  Gross per 24 hour  Intake      0 ml  Output    401 ml  Net   -401 ml   Filed Weights   10/17/12 2040 10/18/12 0429 10/19/12 0549  Weight: 92.851 kg (204 lb 11.2 oz) 92.65 kg (204 lb 4.1 oz) 91.491 kg (201 lb 11.2 oz)    Exam:   General:  Alert and oriented x3  Cardiovascular: Regular rate and rhythm without murmurs rubs or gallops  Respiratory: Kyphotic, no crackles  Abdomen: Obese soft nontender  Data Reviewed: Basic Metabolic Panel:  Lab 44/61/90 0555 10/18/12 0530 10/17/12 1331  NA 139 141 137  K 3.5 3.4* 3.5  CL 99 100 99  CO2 _0 GLUCOSE 105* 110* 145*  BUN _1 CREATININE 0.91 1.01 0.86  CALCIUM 9.3 9.3 9.4  MG 1.8 -- --  PHOS -- -- --   Liver Function Tests:  Lab 10/18/12 0530  AST 21  ALT 9  ALKPHOS 92  BILITOT 0.6  PROT 6.3  ALBUMIN 3.4*   No results found for this  basename: LIPASE:5,AMYLASE:5 in the last 168 hours No results found for this basename: AMMONIA:5 in the last 168 hours CBC:  Lab 10/19/12 0555 10/18/12 2017 10/18/12 0530 10/17/12 1331  WBC 7.9 8.5 7.2 8.0  NEUTROABS -- -- -- --  HGB 14.4 14.9 14.3 15.6*  HCT 42.1 43.1 42.3 45.4  MCV 92.3 92.5 91.6 92.5  PLT 49* 41* 43* 40*   Cardiac Enzymes:  Lab 10/18/12 0628 10/18/12 0112 10/17/12 1959 10/17/12 1331  CKTOTAL -- -- -- --  CKMB -- -- -- --  CKMBINDEX -- -- -- --  TROPONINI 0.51* 0.31* 0.51* <0.30   BNP (last 3 results) No results found for this basename: PROBNP:3 in the last 8760 hours CBG: No results found for this basename: GLUCAP:5 in the last 168 hours  No results found for this or any previous visit (from the past 240 hour(s)).   Studies: Dg Chest 2 View  10/17/2012  *RADIOLOGY REPORT*  Clinical Data: Chest pain.  CHEST - 2 VIEW  Comparison: 06/04/2011  Findings: Heart size and pulmonary vascularity are normal and the lungs are clear.  No acute osseous abnormality.  Spinal cord stimulator in place.  Thoracolumbar fusion noted.  Severe degenerative changes of the shoulders, left worse than right.  IMPRESSION: No acute disease in the chest.   Original Report Authenticated By: Lorriane Shire, M.D.    Ct Angio Chest Pe W/cm &/or Wo Cm  10/17/2012  *RADIOLOGY REPORT*  Clinical Data: Shortness of breath.  Chest pain.  CT ANGIOGRAPHY CHEST  Technique:  Multidetector CT imaging of the chest using the standard protocol during bolus administration of intravenous contrast. Multiplanar reconstructed images including MIPs were obtained and reviewed to evaluate the vascular anatomy.  Contrast: 78m OMNIPAQUE IOHEXOL 350 MG/ML SOLN  Comparison: 10/17/2012  Findings: No filling defect is identified in the pulmonary arterial tree to suggest pulmonary embolus.  No specific findings of acute aortic abnormality, although aortic contrast is very dilute.  Interventricular septum thickened at 2 cm,  favoring left ventricular hypertrophy.  Small mediastinal lymph nodes are not pathologically enlarged by size criteria.  Right infrahilar node 0.7 cm in short axis.  No pericardial effusion or cardiomegaly.  Degenerative glenohumeral arthropathy noted, severe on the left, with extensive subcortical cyst formation and remodeling of the glenoid in a chronic fashion. Fluid collections around the left shoulder joint likely represents effusion and regional bursitis.  Prominent thoracic kyphosis noted.  Dorsal column stimulator noted in the proximal and the T6-T8 levels.  Facet spurring at the T9-10 and T10-11 levels is observed, probably causing osseous foraminal stenosis at T9-10 particularly on the left.  There is endplate sclerosis at TI15-99with pedicle screws at the T12 level.  IMPRESSION:  1.  No findings of embolus or definite acute aortic abnormality. 2.  Subsegmental atelectasis medially in the right lower lobe, with mild right lower lobe bronchiectasis. 3.  Markedly severe arthropathy of the left glenohumeral joint, with remodeling and fragmented spurring of the glenoid, prominent subcortical cyst formation, large joint effusion, and likely regional bursitis.  Prominent but less striking glenohumeral arthropathy on the right. 4.  Left ventricular hypertrophy. 5.  Thoracic kyphosis and spondylosis, with the facet spurring likely causing osseous foraminal stenosis at T9-10.  6.  Dorsal column stimulator noted. 7.  Mild atelectasis medially in the right lower lobe with the right lower lobe cylindrical bronchiectasis.   Original Report Authenticated By: WVan Clines M.D.     Scheduled Meds:    . aspirin  324 mg Oral Once  . aspirin EC  325 mg Oral Daily  . atorvastatin  80 mg Oral q1800  . calcium-vitamin D  1 tablet Oral BID  . furosemide  80 mg Oral Daily  . gabapentin  800 mg Oral TID WC & HS  . metoprolol tartrate  25 mg Oral Q6H  . morphine  100 mg Oral BID  . naproxen  500 mg Oral Once  .  nitroGLYCERIN  0.5 inch Topical Q6H  . potassium chloride  8 mEq Oral BID  . sodium chloride  3 mL Intravenous Q12H  . sodium chloride  3 mL Intravenous Q12H  . vitamin B-12  1,000 mcg Oral Daily  . vitamin C  500 mg Oral Daily   Continuous Infusions:    . heparin 1,100 Units/hr (10/18/12 2209)    Principal Problem:  *Unstable angina Active Problems:  Chest pain  HTN (hypertension)  Dyslipidemia  Chronic back pain  Precordial pain  Thrombocytopenia       Lexie Koehl  [facility] Pager [phone] If 8PM-8AM, please contact night-coverage at [email or web address], password [email or web address]2/10/2012, 9:24 AM  LOS: 2 days

## 2012-10-19 NOTE — Progress Notes (Signed)
ANTICOAGULATION CONSULT NOTE - Follow Up Consult  Pharmacy Consult for heparin Indication: chest pain/ACS  Labs:  Basename 10/19/12 0555 10/18/12 2017 10/18/12 0628 10/18/12 0530 10/18/12 0112 10/17/12 1959 10/17/12 1331  HGB 14.4 14.9 -- -- -- -- --  HCT 42.1 43.1 -- 42.3 -- -- --  PLT 49* 41* -- 43* -- -- --  APTT -- -- -- -- -- -- --  LABPROT -- -- -- -- -- -- --  INR -- -- -- -- -- -- --  HEPARINUNFRC 0.42 0.23* -- -- -- -- --  CREATININE 0.91 -- -- 1.01 -- -- 0.86  CKTOTAL -- -- -- -- -- -- --  CKMB -- -- -- -- -- -- --  TROPONINI -- -- 0.51* -- 0.31* 0.51* --    Assessment/Plan:  79yo female now therapeutic on heparin after rate increase.  Plt remain low but stable.  Will continue gtt at current rate and confirm stable with additional level.  Rogue Bussing PharmD BCPS 10/19/2012,7:22 AM

## 2012-10-20 ENCOUNTER — Encounter (HOSPITAL_COMMUNITY)
Admission: EM | Disposition: A | Discharge status: Home / Self Care | Payer: Self-pay | Source: Home / Self Care | Attending: Internal Medicine

## 2012-10-20 ENCOUNTER — Other Ambulatory Visit: Payer: Self-pay

## 2012-10-20 DIAGNOSIS — I214 Non-ST elevation (NSTEMI) myocardial infarction: Secondary | ICD-10-CM

## 2012-10-20 DIAGNOSIS — I2510 Atherosclerotic heart disease of native coronary artery without angina pectoris: Secondary | ICD-10-CM

## 2012-10-20 HISTORY — PX: LEFT HEART CATHETERIZATION WITH CORONARY ANGIOGRAM: SHX5451

## 2012-10-20 LAB — CBC
Hemoglobin: 14.8 g/dL (ref 12.0–15.0)
MCHC: 34.1 g/dL (ref 30.0–36.0)
RDW: 13.9 % (ref 11.5–15.5)

## 2012-10-20 LAB — HEPARIN LEVEL (UNFRACTIONATED): Heparin Unfractionated: 0.57 IU/mL (ref 0.30–0.70)

## 2012-10-20 SURGERY — LEFT HEART CATHETERIZATION WITH CORONARY ANGIOGRAM
Anesthesia: LOCAL

## 2012-10-20 MED ORDER — BIVALIRUDIN 250 MG IV SOLR
INTRAVENOUS | Status: AC
  Filled 2012-10-20: qty 250

## 2012-10-20 MED ORDER — MIDAZOLAM HCL 2 MG/2ML IJ SOLN
INTRAMUSCULAR | Status: AC
  Filled 2012-10-20: qty 2

## 2012-10-20 MED ORDER — FENTANYL CITRATE 0.05 MG/ML IJ SOLN
INTRAMUSCULAR | Status: AC
  Filled 2012-10-20: qty 2

## 2012-10-20 MED ORDER — NITROGLYCERIN 1 MG/10 ML FOR IR/CATH LAB
INTRA_ARTERIAL | Status: AC
  Filled 2012-10-20: qty 10

## 2012-10-20 MED ORDER — TICAGRELOR 90 MG PO TABS
ORAL_TABLET | ORAL | Status: AC
  Administered 2012-10-21: 90 mg via ORAL
  Filled 2012-10-20: qty 2

## 2012-10-20 MED ORDER — SODIUM CHLORIDE 0.9 % IV SOLN: INTRAVENOUS | Status: AC

## 2012-10-20 MED ORDER — TICAGRELOR 90 MG PO TABS
90.0000 mg | ORAL_TABLET | Freq: Two times a day (BID) | ORAL | Status: DC
  Administered 2012-10-21: 90 mg via ORAL
  Filled 2012-10-20 (×2): qty 1

## 2012-10-20 MED ORDER — ACETAMINOPHEN 325 MG PO TABS
650.0000 mg | ORAL_TABLET | ORAL | Status: DC | PRN
  Administered 2012-10-21: 08:00:00 650 mg via ORAL
  Filled 2012-10-20: qty 2

## 2012-10-20 MED ORDER — ALUM & MAG HYDROXIDE-SIMETH 200-200-20 MG/5ML PO SUSP
15.0000 mL | ORAL | Status: DC | PRN
  Administered 2012-10-20: 15 mL via ORAL
  Filled 2012-10-20: qty 30

## 2012-10-20 MED ORDER — HYDRALAZINE HCL 20 MG/ML IJ SOLN
INTRAMUSCULAR | Status: AC
  Filled 2012-10-20: qty 1

## 2012-10-20 MED ORDER — ONDANSETRON HCL 4 MG/2ML IJ SOLN: 4.0000 mg | Freq: Four times a day (QID) | INTRAMUSCULAR | Status: DC | PRN

## 2012-10-20 NOTE — Interval H&P Note (Signed)
History and Physical Interval Note:  10/20/2012 1:49 PM  [name] [name]  has presented today for cardiac cath  with the diagnosis of cp/nstemi. The various methods of treatment have been discussed with the patient and family. After consideration of risks, benefits and other options for treatment, the patient has consented to  Procedure(s) (LRB) with comments: LEFT HEART CATHETERIZATION WITH CORONARY ANGIOGRAM (N/A) as a surgical intervention .  The patient's history has been reviewed, patient examined, no change in status, stable for surgery.  I have reviewed the patient's chart and labs.  Questions were answered to the patient's satisfaction.     MCALHANY,CHRISTOPHER

## 2012-10-20 NOTE — Interval H&P Note (Signed)
History and Physical Interval Note:  10/20/2012 3:50 PM  [name]  has presented today for cardiac cath with the diagnosis of unstable angina/CAD by cath last week with restenosis in the mid and proximal RCA stented segment.  The various methods of treatment have been discussed with the patient and family. After consideration of risks, benefits and other options for treatment, the patient has consented to  Procedure(s) (LRB) with comments: LEFT HEART CATHETERIZATION WITH CORONARY ANGIOGRAM (N/A) as a surgical intervention .  The patient's history has been reviewed, patient examined, no change in status, stable for surgery.  I have reviewed the patient's chart and labs.  Questions were answered to the patient's satisfaction.     [name] [name]

## 2012-10-20 NOTE — Progress Notes (Signed)
ANTICOAGULATION CONSULT NOTE - Follow Up Consult  Pharmacy Consult for Heparin Indication: chest pain/ACS/NSTEMI  No Known Allergies  Patient Measurements: Height: 5' 2" (157.5 cm) Weight: 205 lb 12.8 oz (93.35 kg) IBW/kg (Calculated) : 50.1  Heparin Dosing Weight: 73 kg  Vital Signs: Temp: 97.7 F (36.5 C) (02/03 0458) BP: 150/55 mmHg (02/03 1011) Pulse Rate: 69  (02/03 0953)  Labs:  Basename 10/20/12 0540 10/19/12 1723 10/19/12 1152 10/19/12 0555 10/18/12 0628 10/18/12 0530 10/18/12 0112 10/17/12 1959 10/17/12 1331  HGB 14.8 -- 14.5 -- -- -- -- -- --  HCT 43.4 -- 42.5 42.1 -- -- -- -- --  PLT 46* -- 43* 49* -- -- -- -- --  APTT -- -- -- -- -- -- -- -- --  LABPROT -- 14.0 -- -- -- -- -- -- --  INR -- 1.09 -- -- -- -- -- -- --  HEPARINUNFRC 0.57 -- 0.31 0.42 -- -- -- -- --  CREATININE -- -- -- 0.91 -- 1.01 -- -- 0.86  CKTOTAL -- -- -- -- -- -- -- -- --  CKMB -- -- -- -- -- -- -- -- --  TROPONINI -- -- -- -- 0.51* -- 0.31* 0.51* --    Estimated Creatinine Clearance: 53.3 ml/min (by C-G formula based on Cr of 0.91).  Assessment:   Heparin level is therapeutic on 1200 units/hr.   Chronic thrombocytopenia. Platelet count low stable.  No bleeding noted.  Takes Aspirin 81 mg at home, currently on 325 mg daily.  For cardiac cath later today.  Goal of Therapy:  Heparin level 0.3-0.7 units/ml Monitor platelets by anticoagulation protocol: Yes   Plan:   Continue heparin drip at 1200 units/hr.  Daily heparin level and CBC while on heparin.  Will follow-up post-cath.    Decrease Aspirin back to 81 mg daily post-cath?  Arty Baumgartner, [place] Pager: [phone] 10/20/2012,10:16 AM

## 2012-10-20 NOTE — Progress Notes (Signed)
Clinical Social Work Department BRIEF PSYCHOSOCIAL ASSESSMENT 10/20/2012  Patient:  [name] [name], [name] [name]     Account Number:  [id number]     Admit date:  10/17/2012  Clinical Social Worker:  Daiva Huge  Date/Time:  10/20/2012 11:13 AM  Referred by:  Physician  Date Referred:  10/20/2012 Referred for  SNF Placement   Other Referral:   Interview type:  Patient Other interview type:   Also spoke with [facility] rep and they have a SNF bed available    PSYCHOSOCIAL DATA Living Status:  FACILITY Admitted from facility:  [facility] Level of care:  Assisted Living Primary support name:   Primary support relationship to patient:   Degree of support available:   GOOD    CURRENT CONCERNS Current Concerns  Post-Acute Placement   Other Concerns:    SOCIAL WORK ASSESSMENT / PLAN Patient has lived at [facility] for 13 years- she resides in the [place] section but may benefit form short SNF stay at the facility at d/c.  SNF bed available at [facility] if needed.   Assessment/plan status:  Other - See comment Other assessment/ plan:   FL2 completion, [place]   Information/referral to community resources:   SNF  EMS    PATIENT'S/FAMILY'S RESPONSE TO PLAN OF CARE: Patient agrees to possible SNF stay back at [facility] home at d/c. Will await PT eval and recommendations.    Eduard Clos, MSW, [place]

## 2012-10-20 NOTE — Progress Notes (Signed)
[facility] PROGRESS NOTE  [name] [name] JEH:[id number] DOB: 1931/07/08 DOA: 10/17/2012 PCP: Mathews Argyle, MD  Assessment/Plan: 1. Unstable angina with positive troponin vs NSTEMI - patient was started on  iv heparin and aspirin and BB and statin on admission. She remained chest pain free during admission. Patient was seen and followed by [facility] cardiology and underwent  LHC 10/20/12 with findings of 99% LAD stenosis - treated with DES. Patient placed on Aspirin and Brilinta.  2. Chronic back pain - resumed home meds , including long-acting morphine and oxycodone 15 mg every 4 hours.  3. Thrombocytopenia - chronic - no further testing planned - follow trend 4. HTN: - on BB. Lasix,   Code Status: full  Family Communication: patient  Disposition Plan: home   Consultants:  [facility]   Procedures:  Left Heart Cath   HPI/Subjective: No further chest pain, just returned from cath   Objective: Filed Vitals:   10/20/12 1011 10/20/12 1124 10/20/12 1330 10/20/12 1405  BP: 150/55 121/45 159/65   Pulse:  64 64 68  Temp:   97.8 F (36.6 C)   TempSrc:      Resp:   18   Height:      Weight:      SpO2:   97%     Intake/Output Summary (Last 24 hours) at 10/20/12 1607 Last data filed at 10/20/12 1000  Gross per 24 hour  Intake   1142 ml  Output    401 ml  Net    741 ml   Filed Weights   10/19/12 0549 10/19/12 1900 10/20/12 0458  Weight: 91.491 kg (201 lb 11.2 oz) 93.759 kg (206 lb 11.2 oz) 93.35 kg (205 lb 12.8 oz)    Exam:   General:  Alert and oriented x3  Cardiovascular: Regular rate and rhythm without murmurs rubs or gallops  Respiratory: Kyphotic, no crackles  Abdomen: Obese soft nontender  Data Reviewed: Basic Metabolic Panel:  Lab 37/85/88 0555 10/18/12 0530 10/17/12 1331  NA 139 141 137  K 3.5 3.4* 3.5  CL 99 100 99  CO2 _0 GLUCOSE 105* 110* 145*  BUN _1 CREATININE 0.91 1.01 0.86  CALCIUM 9.3 9.3 9.4  MG 1.8 -- --   PHOS -- -- --   Liver Function Tests:  Lab 10/18/12 0530  AST 21  ALT 9  ALKPHOS 92  BILITOT 0.6  PROT 6.3  ALBUMIN 3.4*   No results found for this basename: LIPASE:5,AMYLASE:5 in the last 168 hours No results found for this basename: AMMONIA:5 in the last 168 hours CBC:  Lab 10/20/12 0540 10/19/12 1152 10/19/12 0555 10/18/12 2017 10/18/12 0530  WBC 7.9 10.2 7.9 8.5 7.2  NEUTROABS -- -- -- -- --  HGB 14.8 14.5 14.4 14.9 14.3  HCT 43.4 42.5 42.1 43.1 42.3  MCV 93.3 91.6 92.3 92.5 91.6  PLT 46* 43* 49* 41* 43*   Cardiac Enzymes:  Lab 10/18/12 0628 10/18/12 0112 10/17/12 1959 10/17/12 1331  CKTOTAL -- -- -- --  CKMB -- -- -- --  CKMBINDEX -- -- -- --  TROPONINI 0.51* 0.31* 0.51* <0.30   BNP (last 3 results) No results found for this basename: PROBNP:3 in the last 8760 hours CBG: No results found for this basename: GLUCAP:5 in the last 168 hours  No results found for this or any previous visit (from the past 240 hour(s)).   Studies: No results found.  Scheduled Meds:    . aspirin EC  325 mg Oral Daily  . atorvastatin  80 mg Oral q1800  . calcium-vitamin D  1 tablet Oral BID  . furosemide  80 mg Oral Daily  . gabapentin  800 mg Oral TID WC & HS  . metoprolol tartrate  25 mg Oral Q6H  . morphine  100 mg Oral BID  . naproxen  500 mg Oral Once  . nitroGLYCERIN  0.5 inch Topical Q6H  . potassium chloride  8 mEq Oral BID  . vitamin B-12  1,000 mcg Oral Daily  . vitamin C  500 mg Oral Daily   Continuous Infusions:    . sodium chloride 100 mL/hr at 10/20/12 1151  . heparin Stopped (10/20/12 1325)    Principal Problem:  *Unstable angina Active Problems:  Chest pain  HTN (hypertension)  Dyslipidemia  Chronic back pain  Precordial pain  Thrombocytopenia       Tayt Moyers  [facility] Pager [phone]. If 8PM-8AM, please contact night-coverage at [email or web address], password [facility] 10/20/2012, 4:07 PM  LOS: 3 days

## 2012-10-20 NOTE — CV Procedure (Signed)
   Cardiac Catheterization Operative Report  [name] [name] [id number] 2/3/20143:20 PM Mathews Argyle, MD  Procedure Performed:  1. Left Heart Catheterization 2. Selective Coronary Angiography 3. Left ventricular pressures 4. PTCA/DES x 1 mid LAD 5. Angioseal femoral artery closure device  Operator: Lauree Chandler, MD  Indication:  79 yo female admitted with NSTEMI.                                      Procedure Details: The risks, benefits, complications, treatment options, and expected outcomes were discussed with the patient. The patient and/or family concurred with the proposed plan, giving informed consent. The patient was brought to the cath lab after IV hydration was begun and oral premedication was given. The patient was further sedated with Versed and Fentanyl. The right groin was prepped and draped in the usual manner. Using the modified Seldinger access technique, a 5 French sheath was placed in the right femoral artery. Standard diagnostic catheters were used to perform selective coronary angiography. A pigtail catheter was used to measure LV pressures. She was found to have severe stenosis in the proximal and mid LAD. The sheath was upsized to a 6 [place] system. I then engaged the left main artery with a XB LAD 3.5 guiding catheter. She was given 180 mg Brilinta po x 1. She was given a bolus of Angiomax and a drip was started. When the ACT was greater than 200, I passed a Cougar IC wire down the LAD. I then pre-dilated the mid stenosis with a 2.5 x 15 mm balloon. This same balloon was used to pre-dilate the proximal stenosis. I then carefully positioned and deployed a 2.5 x 38 mm Xience Xpedition DES in the proximal to mid LAD. The distal stented segment was post-dilated with a 2.5 x 79m [place] balloon. The proximal stented segment was post-dilated with a 2.75 x 15 mm [place] balloon x 2. The stenosis in the mid segment was taken from 99% down to 0%. The stenosis in the proximal  segment was taken from 99% down to 0%.   There were no immediate complications. The patient was taken to the recovery area in stable condition.   Hemodynamic Findings: Central aortic pressure: 161/71 Left ventricular pressure: 159/20/36  Angiographic Findings:  Left main: 20% distal stenosis.   Left Anterior Descending Artery: Moderate caliber vessel that courses to the apex. There is 99% proximal stenosis. The mid vessel has a 60% stenosis followed by a 99% stenosis just after the second diagonal branch. The distal LAD becomes small caliber and has mild plaque. The first diagonal branch is small caliber and has an ostial 99%. The second diagonal branch has a 30% proximal stenosis.   Circumflex Artery: Large caliber vessel with mild plaque disease.   Right Coronary Artery: Very large, dominant vessel with mild proximal and mid stenosis.   Left Ventricular Angiogram: Deferred.   Impression: 1. Single vessel CAD now s/p successful PTCA/DES x 1 proximal and mid LAD  Recommendations: She will need dual anti-platelet therapy with ASA and Brilinta for one year. Continue statin and beta blocker.        Complications:  None. The patient tolerated the procedure well.

## 2012-10-20 NOTE — H&P (View-Only) (Signed)
   Patient: [name] [name] Date of Encounter: 10/19/2012, 9:15 AM Admit date: 10/17/2012     Subjective  Ms. Monjaras denies any recurrent CP. She denies SOB. She has chronic back pain.   Objective  Physical Exam: Vitals: BP 157/78  Pulse 71  Temp 98.7 F (37.1 C) (Oral)  Resp 20  Ht 5' 2" (1.575 m)  Wt 201 lb 11.2 oz (91.491 kg)  BMI 36.89 kg/m2  SpO2 93% General: Well developed, well appearing 79 year old female in no acute distress. Neck: Supple. JVD not elevated. Lungs: Clear bilaterally to auscultation without wheezes, rales, or rhonchi. Breathing is unlabored. Heart: RRR S1 S2 without murmur, rub or gallop.  Abdomen: Soft, non-distended. Extremities: No clubbing or cyanosis. No edema.  Distal pedal pulses are 2+ and equal bilaterally. Neuro: Alert and oriented X 3. Moves all extremities spontaneously. No focal deficits.  Intake/Output:  Intake/Output Summary (Last 24 hours) at 10/19/12 0915 Last data filed at 10/18/12 1626  Gross per 24 hour  Intake      0 ml  Output    401 ml  Net   -401 ml    Inpatient Medications:     . aspirin  324 mg Oral Once  . aspirin EC  325 mg Oral Daily  . atorvastatin  80 mg Oral q1800  . calcium-vitamin D  1 tablet Oral BID  . furosemide  80 mg Oral Daily  . gabapentin  800 mg Oral TID WC & HS  . metoprolol tartrate  25 mg Oral Q6H  . morphine  100 mg Oral BID  . naproxen  500 mg Oral Once  . nitroGLYCERIN  0.5 inch Topical Q6H  . potassium chloride  8 mEq Oral BID  . sodium chloride  3 mL Intravenous Q12H  . sodium chloride  3 mL Intravenous Q12H  . vitamin B-12  1,000 mcg Oral Daily  . vitamin C  500 mg Oral Daily      . heparin 1,100 Units/hr (10/18/12 2209)    Labs:  Basename 10/19/12 0555 10/18/12 0530  NA 139 141  K 3.5 3.4*  CL 99 100  CO2 31 31  GLUCOSE 105* 110*  BUN 17 16  CREATININE 0.91 1.01  CALCIUM 9.3 9.3  MG 1.8 --  PHOS -- --    Basename 10/18/12 0530  AST 21  ALT 9  ALKPHOS 92    BILITOT 0.6  PROT 6.3  ALBUMIN 3.4*    Basename 10/19/12 0555 10/18/12 2017  WBC 7.9 8.5  NEUTROABS -- --  HGB 14.4 14.9  HCT 42.1 43.1  MCV 92.3 92.5  PLT 49* 41*    Basename 10/18/12 0628 10/18/12 0112 10/17/12 1959 10/17/12 1331  CKTOTAL -- -- -- --  CKMB -- -- -- --  TROPONINI 0.51* 0.31* 0.51* <0.30    Basename 10/17/12 1331  DDIMER 1.03*    Basename 10/17/12 1958  CHOL 175  HDL 53  LDLCALC 100*  TRIG 108  CHOLHDL 3.3    Radiology/Studies: Dg Chest 2 View  10/17/2012  *RADIOLOGY REPORT*  Clinical Data: Chest pain.  CHEST - 2 VIEW  Comparison: 06/04/2011  Findings: Heart size and pulmonary vascularity are normal and the lungs are clear.  No acute osseous abnormality.  Spinal cord stimulator in place.  Thoracolumbar fusion noted.  Severe degenerative changes of the shoulders, left worse than right.  IMPRESSION: No acute disease in the chest.   Original Report Authenticated By: Brok Stocking Maxwell, M.D.      Ct Angio Chest Pe W/cm &/or Wo Cm  10/17/2012  *RADIOLOGY REPORT*  Clinical Data: Shortness of breath.  Chest pain.  CT ANGIOGRAPHY CHEST  Technique:  Multidetector CT imaging of the chest using the standard protocol during bolus administration of intravenous contrast. Multiplanar reconstructed images including MIPs were obtained and reviewed to evaluate the vascular anatomy.  Contrast: 80mL OMNIPAQUE IOHEXOL 350 MG/ML SOLN  Comparison: 10/17/2012  Findings: No filling defect is identified in the pulmonary arterial tree to suggest pulmonary embolus.  No specific findings of acute aortic abnormality, although aortic contrast is very dilute.  Interventricular septum thickened at 2 cm, favoring left ventricular hypertrophy.  Small mediastinal lymph nodes are not pathologically enlarged by size criteria.  Right infrahilar node 0.7 cm in short axis.  No pericardial effusion or cardiomegaly.  Degenerative glenohumeral arthropathy noted, severe on the left, with extensive subcortical  cyst formation and remodeling of the glenoid in a chronic fashion. Fluid collections around the left shoulder joint likely represents effusion and regional bursitis.  Prominent thoracic kyphosis noted.  Dorsal column stimulator noted in the proximal and the T6-T8 levels.  Facet spurring at the T9-10 and T10-11 levels is observed, probably causing osseous foraminal stenosis at T9-10 particularly on the left.  There is endplate sclerosis at T11-12 with pedicle screws at the T12 level.  IMPRESSION:  1.  No findings of embolus or definite acute aortic abnormality. 2.  Subsegmental atelectasis medially in the right lower lobe, with mild right lower lobe bronchiectasis. 3.  Markedly severe arthropathy of the left glenohumeral joint, with remodeling and fragmented spurring of the glenoid, prominent subcortical cyst formation, large joint effusion, and likely regional bursitis.  Prominent but less striking glenohumeral arthropathy on the right. 4.  Left ventricular hypertrophy. 5.  Thoracic kyphosis and spondylosis, with the facet spurring likely causing osseous foraminal stenosis at T9-10.  6.  Dorsal column stimulator noted. 7.  Mild atelectasis medially in the right lower lobe with the right lower lobe cylindrical bronchiectasis.   Original Report Authenticated By: Walter Liebkemann, M.D.     Echocardiogram: Study Conclusions - Left ventricle: The cavity size was normal. Wall thickness was increased in a pattern of moderate LVH. Systolic function was normal. The estimated ejection fraction was in the range of 60% to 65%. Wall motion was normal; there were no regional wall motion abnormalities. Doppler parameters are consistent with abnormal left ventricular relaxation (grade 1 diastolic dysfunction). - Aortic valve: Mild regurgitation. - Mitral valve: No significant regurgitation. - Left atrium: The atrium was mildly dilated. - Right ventricle: The cavity size was normal. Systolic function was normal. -  Pulmonary arteries: No complete TR doppler jet so unable to estimate PA systolic pressure. - Systemic veins: IVC measured 1.9 cm with normal respirophasic variation, suggesting RA pressure 6-10 mmHg.   Telemetry: normal sinus rhythm; 6-beat run NSVT last night ~11:45 PM, asymptomatic    Assessment and Plan  79 yo with history of HTN and chronic back pain presented with chest pain, now with evidence for NSTEMI.   1. NSTEMI  Mild elevation in TnI. ECG with anterior T wave inversions. No further chest pain.  - Echo shows normal LV function without WMAs - ASA, heparin gtt, atorvastatin 80  - Metoprolol 25 every 6 hours  - Plan for LHC tomorrow  2. NSVT - Brief, asymptomatic - Continue BB 2. Chronic pain - Per primary team    Signed, EDMISTEN, BROOKE PA-C   I have seen, examined the patient, and reviewed   the above assessment and plan.  Changes to above are made where necessary.  Pt presently comfortable.  I agree with Dr Aundra Dubin that cath is prudent. Risks, benefits, and alternatives to cath were discussed at length with the patient who wishes to proceed.  We will therefore plan for cath in am.  Co Sign: Thompson Grayer, MD 10/19/2012 12:07 PM

## 2012-10-21 LAB — BASIC METABOLIC PANEL
BUN: 12 mg/dL (ref 6–23)
CO2: 31 mEq/L (ref 19–32)
Calcium: 9.5 mg/dL (ref 8.4–10.5)
Chloride: 102 mEq/L (ref 96–112)
Creatinine, Ser: 0.85 mg/dL (ref 0.50–1.10)
GFR calc Af Amer: 74 mL/min — ABNORMAL LOW (ref >90)
GFR calc non Af Amer: 63 mL/min — ABNORMAL LOW (ref >90)
Glucose, Bld: 99 mg/dL (ref 70–99)
Potassium: 3.6 mEq/L (ref 3.5–5.1)
Sodium: 141 mEq/L (ref 135–145)

## 2012-10-21 LAB — CBC
MCH: 31 pg (ref 26.0–34.0)
MCHC: 33.5 g/dL (ref 30.0–36.0)
Platelets: 43 10*3/uL — ABNORMAL LOW (ref 150–400)

## 2012-10-21 MED ORDER — ATORVASTATIN CALCIUM 80 MG PO TABS: 80.0000 mg | ORAL_TABLET | Freq: Every day | ORAL | Status: DC

## 2012-10-21 MED ORDER — AMLODIPINE BESYLATE 2.5 MG PO TABS: 2.5000 mg | ORAL_TABLET | Freq: Every day | ORAL | Status: DC

## 2012-10-21 MED ORDER — METOPROLOL TARTRATE 50 MG PO TABS
50.0000 mg | ORAL_TABLET | Freq: Four times a day (QID) | ORAL | Status: DC
  Administered 2012-10-21: 13:00:00 50 mg via ORAL
  Filled 2012-10-21 (×4): qty 1

## 2012-10-21 MED ORDER — OXYCODONE HCL 5 MG PO TABS
15.0000 mg | ORAL_TABLET | ORAL | Status: DC | PRN
  Administered 2012-10-21: 13:00:00 15 mg via ORAL
  Filled 2012-10-21: qty 3

## 2012-10-21 MED ORDER — TICAGRELOR 90 MG PO TABS: 90.0000 mg | ORAL_TABLET | Freq: Two times a day (BID) | ORAL | Status: DC

## 2012-10-21 MED ORDER — MORPHINE SULFATE ER 100 MG PO TBCR
100.0000 mg | EXTENDED_RELEASE_TABLET | Freq: Two times a day (BID) | ORAL | Status: DC
  Administered 2012-10-21: 100 mg via ORAL
  Filled 2012-10-21: qty 1

## 2012-10-21 MED ORDER — GABAPENTIN 400 MG PO CAPS
800.0000 mg | ORAL_CAPSULE | Freq: Three times a day (TID) | ORAL | Status: DC
  Administered 2012-10-21: 800 mg via ORAL
  Filled 2012-10-21 (×4): qty 2

## 2012-10-21 MED ORDER — AMLODIPINE BESYLATE 2.5 MG PO TABS
2.5000 mg | ORAL_TABLET | Freq: Every day | ORAL | Status: DC
  Administered 2012-10-21: 09:00:00 2.5 mg via ORAL
  Filled 2012-10-21: qty 1

## 2012-10-21 MED ORDER — METOPROLOL TARTRATE 50 MG PO TABS: 50.0000 mg | ORAL_TABLET | Freq: Two times a day (BID) | ORAL | Status: DC

## 2012-10-21 MED FILL — Dextrose Inj 5%: INTRAVENOUS | Qty: 50 | Status: AC

## 2012-10-21 NOTE — Progress Notes (Signed)
   SUBJECTIVE:  No chest pain.  No SOB   PHYSICAL EXAM Filed Vitals:   10/21/12 0033 10/21/12 0334 10/21/12 0340 10/21/12 0535  BP: 145/74  174/56 162/69  Pulse: 76   76  Temp: 97.9 F (36.6 C) 97.3 F (36.3 C)    TempSrc: Oral Oral    Resp:   18   Height:      Weight:  207 lb 10.8 oz (94.2 kg)    SpO2: 94% 98%     General:  No distress Lungs:  Clear Heart:  RRR Abdomen:  Positive bowel sounds, no rebound no guarding Extremities:  Right groin without hematoma or echymosis  LABS: Lab Results  Component Value Date   TROPONINI 0.51* 10/18/2012   Results for orders placed during the hospital encounter of 10/17/12 (from the past 24 hour(s))  POCT ACTIVATED CLOTTING TIME     Status: Normal   Collection Time   10/20/12  2:51 PM      Component Value Range   Activated Clotting Time 415    CBC     Status: Abnormal   Collection Time   10/21/12  4:20 AM      Component Value Range   WBC 7.5  4.0 - 10.5 K/uL   RBC 4.68  3.87 - 5.11 MIL/uL   Hemoglobin 14.5  12.0 - 15.0 g/dL   HCT 43.3  36.0 - 46.0 %   MCV 92.5  78.0 - 100.0 fL   MCH 31.0  26.0 - 34.0 pg   MCHC 33.5  30.0 - 36.0 g/dL   RDW 14.0  11.5 - 15.5 %   Platelets 43 (*) 150 - 400 K/uL  BASIC METABOLIC PANEL     Status: Abnormal   Collection Time   10/21/12  4:20 AM      Component Value Range   Sodium 141  135 - 145 mEq/L   Potassium 3.6  3.5 - 5.1 mEq/L   Chloride 102  96 - 112 mEq/L   CO2 31  19 - 32 mEq/L   Glucose, Bld 99  70 - 99 mg/dL   BUN 12  6 - 23 mg/dL   Creatinine, Ser 0.85  0.50 - 1.10 mg/dL   Calcium 9.5  8.4 - 10.5 mg/dL   GFR calc non Af Amer 63 (*) >90 mL/min   GFR calc Af Amer 74 (*) >90 mL/min    Intake/Output Summary (Last 24 hours) at 10/21/12 0636 Last data filed at 10/21/12 0200  Gross per 24 hour  Intake 1167.5 ml  Output   1801 ml  Net -633.5 ml    EKG:  NSR, rate 64, poor anterior R wave progression, nonspecific diffuse ST T wave changes.   ASSESSMENT AND PLAN:  Chest pain:  Cath  with 99% LAD stenosis.  Treated with PTCA/DES x 1.  Needs ASA and Brilinta for one year.    HTN:  I would increase to 50 mg bid of Metoprolol (she was on 75 mg) and add low dose Norvasc.    Dyslipidemia:  I would continue Lipitor 80 mg rather than her previous (Mevacor) per current guideline therapy.      Minus Breeding 10/21/2012 6:36 AM

## 2012-10-21 NOTE — Social Work (Signed)
Patient is medically stable for discharge home. CSW intern has facilitated patient transport via ambulance.   Lucius Conn, Social Work Intern 10/21/2012

## 2012-10-21 NOTE — Evaluation (Signed)
Physical Therapy Evaluation Patient Details Name: [name] MRN: [id number] DOB: 01-18-1931 Today's Date: 10/21/2012 Time: 1324-4010 PT Time Calculation (min): 30 min  PT Assessment / Plan / Recommendation Clinical Impression  Pt adm with MI and stent.  Pt with signicant incr in HR with amb.  Pt lives at [facility].  Recommend pt go to SNF unit at [facility] prior to return home but pt does not want to go to SNF.  If pt chooses to return to her apt then I would recommend HHPT.    PT Assessment  Patient needs continued PT services    Follow Up Recommendations  SNF    Does the patient have the potential to tolerate intense rehabilitation      Barriers to Discharge        Equipment Recommendations  None recommended by PT    Recommendations for Other Services     Frequency Min 3X/week    Precautions / Restrictions Precautions Precautions: Fall   Pertinent Vitals/Pain HR 84 at rest and 125 with amb.      Mobility  Transfers Transfers: Sit to Stand;Stand to Sit Sit to Stand: 4: Min assist;With upper extremity assist;With armrests;From chair/3-in-1;4: Min guard (min A from low recliner, min guard from [facility]) Stand to Sit: 4: Min guard;With upper extremity assist;With armrests;To chair/3-in-1 Details for Transfer Assistance: min A from low recliner, min guard from [facility] Ambulation/Gait Ambulation/Gait Assistance: 4: Min guard Ambulation Distance (Feet): 90 Feet Assistive device: Rolling walker Ambulation/Gait Assistance Details: verbal cues to stand as upright as possible.  Pt flexed over walker at ~70 degree angle Gait Pattern: Step-through pattern;Decreased stride length;Trunk flexed General Gait Details: Pt's HR incr 40 beats with amb.    Exercises     PT Diagnosis: Difficulty walking;Generalized weakness  PT Problem List: Decreased activity tolerance;Decreased balance;Decreased mobility;Decreased strength PT Treatment Interventions: Gait training;DME  instruction;Functional mobility training;Patient/family education;Therapeutic activities;Therapeutic exercise;Balance training   PT Goals Acute Rehab PT Goals PT Goal Formulation: With patient Time For Goal Achievement: 10/28/12 Potential to Achieve Goals: Good Pt will go Sit to Stand: with modified independence PT Goal: Sit to Stand - Progress: Goal set today Pt will go Stand to Sit: with modified independence PT Goal: Stand to Sit - Progress: Goal set today Pt will Ambulate: 51 - 150 feet;with modified independence;Other (comment) (With HR incr <20.) PT Goal: Ambulate - Progress: Goal set today  Visit Information  Last PT Received On: 10/21/12 Assistance Needed: +1    Subjective Data  Subjective: Pt states she wants to go back to her apt and not go to the SNF at [facility]. Patient Stated Goal: Return to her apt.   Prior Functioning  Home Living Lives With: Alone Available Help at Discharge: Available PRN/intermittently (Has aide that comes 2x/wk) Type of Home: Apartment Home Access: Level entry Home Layout: One level Bathroom Shower/Tub: Walk-in shower;Tub/shower unit Bathroom Toilet: Handicapped height Home Adaptive Equipment: Youth worker - rolling;Walker - four wheeled;Shower chair with back Prior Function Level of Independence: Independent with assistive device(s) (uses 4 wheeled walker and scooter.) 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    Solicitor Overall Cognitive Status: Appears within functional limits for tasks assessed/performed Arousal/Alertness: Awake/alert Orientation Level: Appears intact for tasks assessed Behavior During Session: [facility] for tasks performed    Extremity/Trunk Assessment Right Lower Extremity Assessment RLE ROM/Strength/Tone: Deficits RLE ROM/Strength/Tone Deficits: grossly 4/5 Left Lower Extremity Assessment LLE ROM/Strength/Tone: Deficits LLE ROM/Strength/Tone Deficits: grossly 4/5    Balance Static Standing Balance  Static Standing - Balance Support: Left upper extremity supported Static Standing - Level of Assistance: 5: Stand by assistance  End of Session PT - End of Session Equipment Utilized During Treatment: Gait belt Activity Tolerance: Other (comment) (Incr HR) Patient left: in chair;with call bell/phone within reach Nurse Communication: Mobility status  GP     [facility] 10/21/2012, 12:43 PM  [facility] PT [phone]

## 2012-10-21 NOTE — Progress Notes (Signed)
[id number] Cardiac Rehab Noted that pt has Physical Therapy consult, so will hold ambulation and let them evaluate her. Completed MI and stent education with pt. She has some difficulty recalling information taught with teach back. Pt states that she is in a lot of pain and when discussed with her nurse she had had pain medication two hours ago.

## 2012-10-21 NOTE — Discharge Summary (Signed)
Physician Discharge Summary  [name] [name] ZOQ:[id number] DOB: September 14, 1931 DOA: 10/17/2012  PCP: Mathews Argyle, MD  Admit date: 10/17/2012 Discharge date: 10/21/2012  Time spent: 35 minutes  Recommendations for Outpatient Follow-up:   Discharge Diagnoses:  Unstable angina with positive troponins vs small NSTEMI - cath with 99% LAD lesion s/p succesful PCI with DES  HTN (hypertension)  Dyslipidemia  Chronic back pain  Thrombocytopenia   Discharge Condition: good  Diet recommendation: heart healthy  Filed Weights   10/19/12 1900 10/20/12 0458 10/21/12 0334  Weight: 93.759 kg (206 lb 11.2 oz) 93.35 kg (205 lb 12.8 oz) 94.2 kg (207 lb 10.8 oz)    History of present illness:  This is a 79 year old female resident of [facility], with known history of HTN, dyslipidemia, PVD, osteopenia, DJD, s/p lumbar disk surgery 2005, chronic low back pain, s/p neurostimulator implantation 2008, s/p right THR 2008, s/p bilateral blepharoplasty 2009, s/p corrective surgery for left eye strabismus 01/2011, LE venous insufficiency, with venous ulcer LLE, treated with Unna boot 09/2009 at wound care center, now healed, chronic thrombocytopenia, under hematologist care, presenting with pain in substernal region, described as a "heaviness" which began at approximately 11:00 AM on 10/17/12, while she was getting dressed to go to lunch at her assisted living facility. No SOB, no nausea, no diaphoresis or radiation of pain. According to patient, she did go to lunch, but did not finish eating her meal, as the heaviness persisted. She then went over to see the RN at the ALF, and RN called EMS. She was brought to the ED, and received SL NTG en-route, with relief. Total duration of pain was about an hour. According to patient, she has had over the past 2-3 night, "twinges" of similar chest discomfort, lasting form a few minutes to half an hour. She has no prior history of CAD or chest pains.       Hospital Course:  1. Unstable angina with positive troponin vs NSTEMI - patient was started on iv heparin and aspirin and BB and statin on admission. She remained chest pain free during admission. Patient was seen and followed by [facility] cardiology and underwent LHC 10/20/12 with findings of 99% LAD stenosis - treated with DES. Patient placed on Aspirin and Brilinta which she will need to continue for at least 1 year  2. Chronic back pain - resumed home meds , including long-acting morphine and oxycodone 15 mg every 4 hours.  3. Thrombocytopenia - chronic - no further testing planned - follow trend as outpatient. Stable in house.  4. HTN: - patient's medications were adjusted by Dr. Percival Spanish prior to discharge   Procedures: Cardiac cath Consultations:  [place] cards  Discharge Exam: Filed Vitals:   10/21/12 0535 10/21/12 0813 10/21/12 1200 10/21/12 1215  BP: 162/69 196/87    Pulse: 76 73 84 125  Temp:  97.3 F (36.3 C)    TempSrc:  Oral    Resp:  20    Height:      Weight:      SpO2:  96%      General: axox3 Cardiovascular: rrr Respiratory: ctab  Discharge Instructions  Discharge Orders    Future Appointments: Provider: Department: Dept Phone: Center:   11/04/2012 9:50 AM Liliane Shi, [place]) [phone] [id number]       Medication List     As of 10/21/2012  1:20 PM    STOP taking these medications  lovastatin 10 MG tablet   Commonly known as: MEVACOR      naproxen sodium 220 MG tablet   Commonly known as: ANAPROX      TAKE these medications         amLODipine 2.5 MG tablet   Commonly known as: NORVASC   Take 1 tablet (2.5 mg total) by mouth daily.      aspirin 81 MG tablet   Take 81 mg by mouth daily.      atorvastatin 80 MG tablet   Commonly known as: LIPITOR   Take 1 tablet (80 mg total) by mouth daily at 6 PM.      CALCARB 600/D PO   Take 2 tablets by mouth daily.      CENTRUM SILVER PO   Take  1 tablet by mouth daily.      fluticasone 50 MCG/ACT nasal spray   Commonly known as: FLONASE   Place 2 sprays into the nose daily as needed. allergies      furosemide 80 MG tablet   Commonly known as: LASIX   Take 80 mg by mouth daily.      gabapentin 800 MG tablet   Commonly known as: NEURONTIN   Take 800 mg by mouth 4 (four) times daily.      glucosamine-chondroitin 500-400 MG tablet   Take 2 tablets by mouth daily.      metoprolol 50 MG tablet   Commonly known as: LOPRESSOR   Take 1 tablet (50 mg total) by mouth 2 (two) times daily. takes with a 50 mg for total of 75 mg twice daily      morphine 100 MG 12 hr tablet   Commonly known as: MS CONTIN   Take 100 mg by mouth 2 (two) times daily.      oxyCODONE 15 MG immediate release tablet   Commonly known as: ROXICODONE   Take 15 mg by mouth every 4 (four) hours as needed. For pain .   Max 8 tabs in 24 hrs      potassium chloride 8 MEQ tablet   Commonly known as: KLOR-CON   Take 8 mEq by mouth 2 (two) times daily.      Ticagrelor 90 MG Tabs tablet   Commonly known as: BRILINTA   Take 1 tablet (90 mg total) by mouth 2 (two) times daily.      vitamin B-12 1000 MCG tablet   Commonly known as: CYANOCOBALAMIN   Take 1,000 mcg by mouth daily.      vitamin C 500 MG tablet   Commonly known as: ASCORBIC ACID   Take 500 mg by mouth daily.           Follow-up Information    Follow up with Richardson Dopp, PA. On 11/04/2012. (see for Dr Percival Spanish at 9:50 am)    Contact information:   [address] [place] [address] [phone]       Schedule an appointment as soon as possible for a visit with Mathews Argyle, MD.   Contact information:   [address] [place] [address] [phone]           The results of significant diagnostics from this hospitalization (including imaging, microbiology, ancillary and laboratory) are listed below for reference.    Significant  Diagnostic Studies: Dg Chest 2 View  10/17/2012  *RADIOLOGY REPORT*  Clinical Data: Chest pain.  CHEST - 2 VIEW  Comparison: 06/04/2011  Findings: Heart size and pulmonary  vascularity are normal and the lungs are clear.  No acute osseous abnormality.  Spinal cord stimulator in place.  Thoracolumbar fusion noted.  Severe degenerative changes of the shoulders, left worse than right.  IMPRESSION: No acute disease in the chest.   Original Report Authenticated By: Lorriane Shire, M.D.    Ct Angio Chest Pe W/cm &/or Wo Cm  10/17/2012  *RADIOLOGY REPORT*  Clinical Data: Shortness of breath.  Chest pain.  CT ANGIOGRAPHY CHEST  Technique:  Multidetector CT imaging of the chest using the standard protocol during bolus administration of intravenous contrast. Multiplanar reconstructed images including MIPs were obtained and reviewed to evaluate the vascular anatomy.  Contrast: 20m OMNIPAQUE IOHEXOL 350 MG/ML SOLN  Comparison: 10/17/2012  Findings: No filling defect is identified in the pulmonary arterial tree to suggest pulmonary embolus.  No specific findings of acute aortic abnormality, although aortic contrast is very dilute.  Interventricular septum thickened at 2 cm, favoring left ventricular hypertrophy.  Small mediastinal lymph nodes are not pathologically enlarged by size criteria.  Right infrahilar node 0.7 cm in short axis.  No pericardial effusion or cardiomegaly.  Degenerative glenohumeral arthropathy noted, severe on the left, with extensive subcortical cyst formation and remodeling of the glenoid in a chronic fashion. Fluid collections around the left shoulder joint likely represents effusion and regional bursitis.  Prominent thoracic kyphosis noted.  Dorsal column stimulator noted in the proximal and the T6-T8 levels.  Facet spurring at the T9-10 and T10-11 levels is observed, probably causing osseous foraminal stenosis at T9-10 particularly on the left.  There is endplate sclerosis at [id number] pedicle  screws at the T12 level.  IMPRESSION:  1.  No findings of embolus or definite acute aortic abnormality. 2.  Subsegmental atelectasis medially in the right lower lobe, with mild right lower lobe bronchiectasis. 3.  Markedly severe arthropathy of the left glenohumeral joint, with remodeling and fragmented spurring of the glenoid, prominent subcortical cyst formation, large joint effusion, and likely regional bursitis.  Prominent but less striking glenohumeral arthropathy on the right. 4.  Left ventricular hypertrophy. 5.  Thoracic kyphosis and spondylosis, with the facet spurring likely causing osseous foraminal stenosis at T9-10.  6.  Dorsal column stimulator noted. 7.  Mild atelectasis medially in the right lower lobe with the right lower lobe cylindrical bronchiectasis.   Original Report Authenticated By: WVan Clines M.D.     Microbiology: No results found for this or any previous visit (from the past 240 hour(s)).   Labs: Basic Metabolic Panel:  Lab [id number] 10/19/12 0555 10/18/12 0530 10/17/12 1331  NA 141 139 141 137  K 3.6 3.5 3.4* 3.5  CL 102 99 100 99  CO2 _0 GLUCOSE 99 105* 110* 145*  BUN _1 CREATININE 0.85 0.91 1.01 0.86  CALCIUM 9.5 9.3 9.3 9.4  MG -- 1.8 -- --  PHOS -- -- -- --   Liver Function Tests:  Lab 10/18/12 0530  AST 21  ALT 9  ALKPHOS 92  BILITOT 0.6  PROT 6.3  ALBUMIN 3.4*   No results found for this basename: LIPASE:5,AMYLASE:5 in the last 168 hours No results found for this basename: AMMONIA:5 in the last 168 hours CBC:  Lab 10/21/12 0420 10/20/12 0540 10/19/12 1152 10/19/12 0555 10/18/12 2017  WBC 7.5 7.9 10.2 7.9 8.5  NEUTROABS -- -- -- -- --  HGB 14.5 14.8 14.5 14.4 14.9  HCT 43.3 43.4 42.5 42.1 43.1  MCV 92.5 93.3 91.6  92.3 92.5  PLT 43* 46* 43* 49* 41*   Cardiac Enzymes:  Lab 10/18/12 0628 10/18/12 0112 10/17/12 1959 10/17/12 1331  CKTOTAL -- -- -- --  CKMB -- -- -- --  CKMBINDEX -- -- -- --  TROPONINI 0.51*  0.31* 0.51* <0.30   BNP: BNP (last 3 results) No results found for this basename: PROBNP:3 in the last 8760 hours CBG: No results found for this basename: GLUCAP:5 in the last 168 hours     Signed:  Twyla Dais  [facility] 10/21/2012, 1:20 PM

## 2012-11-04 ENCOUNTER — Encounter: Payer: Medicare Other | Admitting: Physician Assistant

## 2012-11-05 ENCOUNTER — Telehealth: Payer: Self-pay | Admitting: Hematology & Oncology

## 2012-11-05 ENCOUNTER — Ambulatory Visit (INDEPENDENT_AMBULATORY_CARE_PROVIDER_SITE_OTHER): Payer: Medicare Other | Admitting: Physician Assistant

## 2012-11-05 ENCOUNTER — Encounter: Payer: Self-pay | Admitting: Physician Assistant

## 2012-11-05 VITALS — BP 122/68 | HR 64 | Ht 62.0 in | Wt 201.4 lb

## 2012-11-05 DIAGNOSIS — E785 Hyperlipidemia, unspecified: Secondary | ICD-10-CM

## 2012-11-05 DIAGNOSIS — I10 Essential (primary) hypertension: Secondary | ICD-10-CM

## 2012-11-05 DIAGNOSIS — D696 Thrombocytopenia, unspecified: Secondary | ICD-10-CM

## 2012-11-05 DIAGNOSIS — I2510 Atherosclerotic heart disease of native coronary artery without angina pectoris: Secondary | ICD-10-CM

## 2012-11-05 MED ORDER — METOPROLOL TARTRATE 25 MG PO TABS: 25.0000 mg | ORAL_TABLET | Freq: Two times a day (BID) | ORAL | Status: DC

## 2012-11-05 MED ORDER — METOPROLOL TARTRATE 50 MG PO TABS: 50.0000 mg | ORAL_TABLET | Freq: Two times a day (BID) | ORAL | Status: DC

## 2012-11-05 NOTE — Progress Notes (Signed)
[address], [place], [place]  [address] Phone: [phone], Fax:  [phone]  Date:  11/05/2012   ID:  BRYLAN SEUBERT, DOB 1931/03/26, MRN [id number]  PCP:  Mathews Argyle, MD  Primary Cardiologist:  Dr. Minus Breeding     History of Present Illness: UMEKA [name] is a 79 y.o. female who returns for follow up after a recent admission to the hospital for a non-STEMI. She has a hx of HTN, HL, chronic thrombocytopenia, arthritis. She was admitted 1/31-2/4 after presenting with worsening chest pain.. ECG demonstrated T-wave inversions in anterior leads. Chest CT was negative for pulmonary embolus. She subsequently ruled in for myocardial infarction. LHC 10/20/12:  dLM 20%, pLAD 99%, mLAD 60% followed by 99%, oD1 99%, and pD2 30%, mild plaque disease in the CFX and RCA. PCI: Xience Xpedition DES to the proximal and mid LAD. Dual antiplatelet therapy recommended for one year.  Echo 10/18/12: mod LVH, EF 60-65%, Gr 1 diast dysfn, mild AI, mild LAE.  She lives at [facility]. Since discharge, she is doing well. She denies chest pain, shortness of breath, syncope, orthopnea, PND. She has chronic LE edema without change. She is not that active due to significant pain from arthritis. She typically rides around in a scooter.  Labs (2/14):  K 3.6, creatinine 0.85, ALT 9, LDL 100, Hgb 14.5, platelets 43,000  Wt Readings from Last 3 Encounters:  11/05/12 201 lb 6.4 oz (91.354 kg)  10/21/12 207 lb 10.8 oz (94.2 kg)  10/21/12 207 lb 10.8 oz (94.2 kg)     Past Medical History  Diagnosis Date  . HTN (hypertension)   . DJD (degenerative joint disease)   . Osteopenia   . Dyslipidemia   . Venous ulcer   . Thrombocytopenia   . CAD (coronary artery disease)     a. NSTEMI 2/14 => LHC 10/20/12:  dLM 20%, pLAD 99%, mLAD 60% followed by 99%, oD1 99%, and pD2 30%, mild plaque disease in the CFX and RCA. PCI: Xience Xpedition DES to the proximal and mid LAD  . Hx of  echocardiogram     a. Echo 10/18/12: mod LVH, EF 60-65%, Gr 1 diast dysfn, mild AI, mild LAE.    Current Outpatient Prescriptions  Medication Sig Dispense Refill  . amLODipine (NORVASC) 2.5 MG tablet Take 1 tablet (2.5 mg total) by mouth daily.  30 tablet  0  . aspirin 81 MG tablet Take 81 mg by mouth daily.      Marland Kitchen atorvastatin (LIPITOR) 80 MG tablet Take 1 tablet (80 mg total) by mouth daily at 6 PM.  30 tablet  0  . Calcium Carbonate-Vitamin D (CALCARB 600/D PO) Take 2 tablets by mouth daily.      . fluticasone (FLONASE) 50 MCG/ACT nasal spray Place 2 sprays into the nose daily as needed. allergies      . furosemide (LASIX) 80 MG tablet Take 80 mg by mouth daily.      Marland Kitchen gabapentin (NEURONTIN) 800 MG tablet Take 800 mg by mouth 4 (four) times daily.      Marland Kitchen glucosamine-chondroitin 500-400 MG tablet Take 2 tablets by mouth daily.      . metoprolol tartrate (LOPRESSOR) 50 MG tablet Take 1 tablet (50 mg total) by mouth 2 (two) times daily. takes with a 50 mg for total of 75 mg twice daily  60 tablet  0  . morphine (MS CONTIN) 100 MG 12 hr tablet Take 100 mg by mouth 2 (two)  times daily.      . Multiple Vitamins-Minerals (CENTRUM SILVER PO) Take 1 tablet by mouth daily.      Marland Kitchen oxyCODONE (ROXICODONE) 15 MG immediate release tablet Take 15 mg by mouth every 4 (four) hours as needed. For pain .  Max 8 tabs in 24 hrs      . potassium chloride (KLOR-CON) 8 MEQ tablet Take 8 mEq by mouth 2 (two) times daily.      . Ticagrelor (BRILINTA) 90 MG TABS tablet Take 1 tablet (90 mg total) by mouth 2 (two) times daily.  60 tablet  0  . vitamin B-12 (CYANOCOBALAMIN) 1000 MCG tablet Take 1,000 mcg by mouth daily.      . vitamin C (ASCORBIC ACID) 500 MG tablet Take 500 mg by mouth daily.       No current facility-administered medications for this visit.    Allergies:   No Known Allergies  Social History:  The patient  reports that she has never smoked. She does not have any smokeless tobacco history on file.  She reports that she does not drink alcohol.   ROS:  Please see the history of present illness.   She denies melena, hematochezia, epistaxis, hematuria, significant bruising.   All other systems reviewed and negative.   PHYSICAL EXAM: VS:  BP 122/68  Pulse 64  Ht 5' 2" (1.575 m)  Wt 201 lb 6.4 oz (91.354 kg)  BMI 36.83 kg/m2 Well nourished, well developed, in no acute distress HEENT: normal Neck: no JVD at 90 Cardiac:  normal S1, S2; RRR; no murmur Lungs:  clear to auscultation bilaterally, no wheezing, rhonchi or rales Abd: soft, nontender, no hepatomegaly Ext: no edema; right groin without hematoma or bruit  Skin: warm and dry Neuro:  CNs 2-12 intact, no focal abnormalities noted  EKG:  NSR, HR 64, PACs, poor R wave progression, no change from prior tracing     ASSESSMENT AND PLAN:  1. Coronary Artery Disease:  Patient is stable after recent PCI for non-STEMI. She is tolerating aspirin and Brilinta. We discussed the importance of continuing dual antiplatelet therapy for drug-eluting stents in the setting of ACS. She is unable to perform much activity due to her significant arthritis. Therefore, she will not be referred to cardiac rehabilitation. 2. Thrombocytopenia: This is a chronic problem. She has seen hematology in the past. I will have her followup with her hematologist as she is now on dual antiplatelet therapy. I did discuss this with Dr. Percival Spanish. The decision was made in the hospital to proceed with DES and dual antiplatelet therapy because her platelets have been fairly stable over multiple years. Check a CBC today. 3. Hypertension: Controlled. 4. Hyperlipidemia: Lipitor increased in the hospital. Check lipids and LFTs in 6-8 weeks. 5. Disposition: Followup with Dr. Percival Spanish in 2 months.  Danton Sewer, PA-C  3:23 PM 11/05/2012

## 2012-11-05 NOTE — Telephone Encounter (Signed)
MD aware of 4-7 appointment scheduled for pt

## 2012-11-05 NOTE — Patient Instructions (Addendum)
LAB TODAY; CBC W/DIFF  YOU HAVE A FOLLOW UP WITH DR. Marin Olp 12/22/12 @ 10:30  YOU HAVE A FOLLOW UP WITH DR. HOCHREIN 12/18/12 @ 2 PM  NO CHANGES WERE MADE TODAY

## 2012-11-05 NOTE — Telephone Encounter (Signed)
Arbie Cookey from [facility] called and said pt needed to be seen within 2 months. She will let pt know 12-22-12 appointment and time

## 2012-11-06 ENCOUNTER — Other Ambulatory Visit: Payer: Self-pay

## 2012-11-06 LAB — CBC WITH DIFFERENTIAL/PLATELET
Basophils Relative: 0.2 % (ref 0.0–3.0)
Eosinophils Relative: 3.4 % (ref 0.0–5.0)
HCT: 45.3 % (ref 36.0–46.0)
Hemoglobin: 15.2 g/dL — ABNORMAL HIGH (ref 12.0–15.0)
Lymphs Abs: 1.7 10*3/uL (ref 0.7–4.0)
MCV: 93.3 fl (ref 78.0–100.0)
Monocytes Absolute: 0.3 10*3/uL (ref 0.1–1.0)
Neutro Abs: 6.9 10*3/uL (ref 1.4–7.7)
RBC: 4.86 Mil/uL (ref 3.87–5.11)
WBC: 9.1 10*3/uL (ref 4.5–10.5)

## 2012-12-18 ENCOUNTER — Encounter: Payer: Self-pay | Admitting: Cardiology

## 2012-12-18 ENCOUNTER — Ambulatory Visit (INDEPENDENT_AMBULATORY_CARE_PROVIDER_SITE_OTHER): Payer: Medicare Other | Admitting: Cardiology

## 2012-12-18 VITALS — BP 110/70 | HR 95 | Ht 62.0 in

## 2012-12-18 DIAGNOSIS — I10 Essential (primary) hypertension: Secondary | ICD-10-CM

## 2012-12-18 DIAGNOSIS — I25810 Atherosclerosis of coronary artery bypass graft(s) without angina pectoris: Secondary | ICD-10-CM | POA: Insufficient documentation

## 2012-12-18 DIAGNOSIS — I200 Unstable angina: Secondary | ICD-10-CM

## 2012-12-18 NOTE — Progress Notes (Signed)
HPI The patient presents for followup of known coronary disease. Since she was last year she's had no new cardiovascular complaints. She has been living in a nursing home but the independent living. Apparently she ambulates minimally holding onto a wheelchair. However, today she couldn't move her legs. She says it slowly gotten better this morning and she can now stand slightly from her chair but not completely. She says this is an acute change and I did talk with her nurse who says she is clearly weaker than she was. However, she's not describing any focal neurologic deficits such as visual disturbances or motor disturbances. She's had no upper extremity weakness. She's not having any chest pressure, neck or arm discomfort. She's not reporting palpitations, presyncope or. She has had no PND or orthopnea.  No Known Allergies  Current Outpatient Prescriptions  Medication Sig Dispense Refill  . amLODipine (NORVASC) 2.5 MG tablet Take 1 tablet (2.5 mg total) by mouth daily.  30 tablet  0  . aspirin 81 MG tablet Take 81 mg by mouth daily.      Marland Kitchen atorvastatin (LIPITOR) 80 MG tablet Take 1 tablet (80 mg total) by mouth daily at 6 PM.  30 tablet  0  . Calcium Carbonate-Vitamin D (CALCARB 600/D PO) Take 2 tablets by mouth daily.      . fluticasone (FLONASE) 50 MCG/ACT nasal spray Place 2 sprays into the nose daily as needed. allergies      . furosemide (LASIX) 80 MG tablet Take 80 mg by mouth daily.      Marland Kitchen gabapentin (NEURONTIN) 800 MG tablet Take 800 mg by mouth 4 (four) times daily.      Marland Kitchen glucosamine-chondroitin 500-400 MG tablet Take 2 tablets by mouth daily.      . metoprolol (LOPRESSOR) 50 MG tablet Take 1 tablet (50 mg total) by mouth 2 (two) times daily. Also with 25 mg tab to = 75 mg twice daily      . metoprolol tartrate (LOPRESSOR) 25 MG tablet Take 1 tablet (25 mg total) by mouth 2 (two) times daily. With 50 mg tab twice daily = 75 mg twice daily      . morphine (MS CONTIN) 100 MG 12 hr  tablet Take 100 mg by mouth 2 (two) times daily.      . Multiple Vitamins-Minerals (CENTRUM SILVER PO) Take 1 tablet by mouth daily.      Marland Kitchen oxyCODONE (ROXICODONE) 15 MG immediate release tablet Take 15 mg by mouth every 4 (four) hours as needed. For pain .  Max 8 tabs in 24 hrs      . potassium chloride (KLOR-CON) 8 MEQ tablet Take 8 mEq by mouth 2 (two) times daily.      . Ticagrelor (BRILINTA) 90 MG TABS tablet Take 1 tablet (90 mg total) by mouth 2 (two) times daily.  60 tablet  0  . vitamin B-12 (CYANOCOBALAMIN) 1000 MCG tablet Take 1,000 mcg by mouth daily.      . vitamin C (ASCORBIC ACID) 500 MG tablet Take 500 mg by mouth daily.       No current facility-administered medications for this visit.    Past Medical History  Diagnosis Date  . HTN (hypertension)   . DJD (degenerative joint disease)   . Osteopenia   . Dyslipidemia   . Venous ulcer   . Thrombocytopenia   . CAD (coronary artery disease)     a. NSTEMI 2/14 => LHC 10/20/12:  dLM 20%, pLAD 99%, mLAD 60%  followed by 99%, oD1 99%, and pD2 30%, mild plaque disease in the CFX and RCA. PCI: Xience Xpedition DES to the proximal and mid LAD  . Hx of echocardiogram     a. Echo 10/18/12: mod LVH, EF 60-65%, Gr 1 diast dysfn, mild AI, mild LAE.    Past Surgical History  Procedure Laterality Date  . Back surgery      Lumbar disc  . Hip surgery      R THR  . Eye surgery      Blepharoplasty  . Insertion / placement / revision neurostimulator      ROS:  As stated in the HPI and negative for all other systems.  PHYSICAL EXAM BP 110/70  Pulse 95  Ht _0  (1.575 m)  SpO2 93% GEN:  No distress, frail appearing NECK:  No jugular venous distention at 90 degrees, waveform within normal limits, carotid upstroke brisk and symmetric, no bruits, no thyromegaly LYMPHATICS:  No cervical adenopathy LUNGS:  Clear to auscultation bilaterally BACK:  No CVA tenderness CHEST:  Unremarkable HEART:  S1 and S2 within normal limits, no S3, no  S4, no clicks, no rubs, no murmurs ABD:  Positive bowel sounds normal in frequency in pitch, no bruits, no rebound, no guarding, unable to assess midline mass or bruit with the patient seated. EXT:  2 plus pulses throughout, moderate edema, no cyanosis no clubbing,  SKIN:  No rashes no nodules NEURO:  Cranial nerves II through XII grossly intact, motor grossly intact throughout PSYCH:  Cognitively intact, oriented to person place and time   EKG:  Sinus rhythm,axis within normal limits, intervals within normal limits, poor anterior R wave progression, possible old anteroseptal infarct, no acute ST-T wave changes. 12/18/2012  ASSESSMENT AND PLAN  CAD:  The patient has no active evidence of ischemia. No further cardiovascular testing is suggested. She can continue on the meds as listed.  HTN:    The blood pressure is at target. No change in medications is indicated. We will continue with therapeutic lifestyle changes (TLC).  LEG WEAKNESS:  This is her most acute complaint. She does not have a focal neurologic exam. I did speak with the nurse who saw her today and says this is an acute exacerbation of her chronic weakness but she is more week. I do not expect that she had a CVA. However, it seems that she would be very frail to return back to independent living. We are trying to make arrangements today to move her to a more skilled setting.  I spoke with Dr. Felipa Eth who will follow her in the skilled nursing.     (Greater than 40 minutes reviewing all data with greater than 50% face to face with the patient).

## 2012-12-18 NOTE — Patient Instructions (Addendum)
The current medical regimen is effective;  continue present plan and medications.  Follow up in 1 year with Dr Hochrein.  You will receive a letter in the mail 2 months before you are due.  Please call us when you receive this letter to schedule your follow up appointment.  

## 2012-12-20 ENCOUNTER — Other Ambulatory Visit: Payer: Self-pay

## 2012-12-20 ENCOUNTER — Inpatient Hospital Stay (HOSPITAL_COMMUNITY): Payer: Medicare Other

## 2012-12-20 ENCOUNTER — Emergency Department (HOSPITAL_COMMUNITY): Payer: Medicare Other

## 2012-12-20 ENCOUNTER — Encounter (HOSPITAL_COMMUNITY): Payer: Self-pay | Admitting: Internal Medicine

## 2012-12-20 ENCOUNTER — Inpatient Hospital Stay (HOSPITAL_COMMUNITY)
Admission: EM | Admit: 2012-12-20 | Discharge: 2012-12-26 | DRG: 872 | Disposition: A | Discharge status: Skilled Nursing Facility | Payer: Medicare Other | Attending: Internal Medicine | Admitting: Internal Medicine

## 2012-12-20 DIAGNOSIS — E663 Overweight: Secondary | ICD-10-CM | POA: Diagnosis present

## 2012-12-20 DIAGNOSIS — Z7982 Long term (current) use of aspirin: Secondary | ICD-10-CM

## 2012-12-20 DIAGNOSIS — F29 Unspecified psychosis not due to a substance or known physiological condition: Secondary | ICD-10-CM | POA: Diagnosis present

## 2012-12-20 DIAGNOSIS — D696 Thrombocytopenia, unspecified: Secondary | ICD-10-CM | POA: Diagnosis present

## 2012-12-20 DIAGNOSIS — Z8249 Family history of ischemic heart disease and other diseases of the circulatory system: Secondary | ICD-10-CM

## 2012-12-20 DIAGNOSIS — K8309 Other cholangitis: Secondary | ICD-10-CM | POA: Diagnosis present

## 2012-12-20 DIAGNOSIS — R1011 Right upper quadrant pain: Secondary | ICD-10-CM

## 2012-12-20 DIAGNOSIS — R6520 Severe sepsis without septic shock: Secondary | ICD-10-CM | POA: Diagnosis present

## 2012-12-20 DIAGNOSIS — Z79899 Other long term (current) drug therapy: Secondary | ICD-10-CM

## 2012-12-20 DIAGNOSIS — I10 Essential (primary) hypertension: Secondary | ICD-10-CM | POA: Diagnosis present

## 2012-12-20 DIAGNOSIS — Z993 Dependence on wheelchair: Secondary | ICD-10-CM

## 2012-12-20 DIAGNOSIS — A4159 Other Gram-negative sepsis: Principal | ICD-10-CM | POA: Diagnosis present

## 2012-12-20 DIAGNOSIS — M549 Dorsalgia, unspecified: Secondary | ICD-10-CM | POA: Diagnosis present

## 2012-12-20 DIAGNOSIS — Z6836 Body mass index (BMI) 36.0-36.9, adult: Secondary | ICD-10-CM

## 2012-12-20 DIAGNOSIS — G8929 Other chronic pain: Secondary | ICD-10-CM | POA: Diagnosis present

## 2012-12-20 DIAGNOSIS — R109 Unspecified abdominal pain: Secondary | ICD-10-CM

## 2012-12-20 DIAGNOSIS — K819 Cholecystitis, unspecified: Secondary | ICD-10-CM

## 2012-12-20 DIAGNOSIS — E785 Hyperlipidemia, unspecified: Secondary | ICD-10-CM | POA: Diagnosis present

## 2012-12-20 DIAGNOSIS — E876 Hypokalemia: Secondary | ICD-10-CM | POA: Diagnosis present

## 2012-12-20 DIAGNOSIS — Z66 Do not resuscitate: Secondary | ICD-10-CM | POA: Diagnosis present

## 2012-12-20 DIAGNOSIS — Z9861 Coronary angioplasty status: Secondary | ICD-10-CM

## 2012-12-20 DIAGNOSIS — I214 Non-ST elevation (NSTEMI) myocardial infarction: Secondary | ICD-10-CM | POA: Diagnosis present

## 2012-12-20 DIAGNOSIS — I248 Other forms of acute ischemic heart disease: Secondary | ICD-10-CM | POA: Diagnosis present

## 2012-12-20 DIAGNOSIS — I25810 Atherosclerosis of coronary artery bypass graft(s) without angina pectoris: Secondary | ICD-10-CM | POA: Diagnosis present

## 2012-12-20 DIAGNOSIS — A419 Sepsis, unspecified organism: Secondary | ICD-10-CM | POA: Diagnosis present

## 2012-12-20 DIAGNOSIS — K810 Acute cholecystitis: Secondary | ICD-10-CM | POA: Diagnosis present

## 2012-12-20 DIAGNOSIS — M899 Disorder of bone, unspecified: Secondary | ICD-10-CM | POA: Diagnosis present

## 2012-12-20 DIAGNOSIS — I2489 Other forms of acute ischemic heart disease: Secondary | ICD-10-CM | POA: Diagnosis present

## 2012-12-20 LAB — URINALYSIS, ROUTINE W REFLEX MICROSCOPIC
Bilirubin Urine: NEGATIVE
Glucose, UA: NEGATIVE mg/dL
Hgb urine dipstick: NEGATIVE
Ketones, ur: NEGATIVE mg/dL
Leukocytes, UA: NEGATIVE
Protein, ur: 100 mg/dL — AB
pH: 6 (ref 5.0–8.0)

## 2012-12-20 LAB — CBC WITH DIFFERENTIAL/PLATELET
Basophils Relative: 0 % (ref 0–1)
Eosinophils Absolute: 0 10*3/uL (ref 0.0–0.7)
Hemoglobin: 18.2 g/dL — ABNORMAL HIGH (ref 12.0–15.0)
MCH: 32.7 pg (ref 26.0–34.0)
MCHC: 36.8 g/dL — ABNORMAL HIGH (ref 30.0–36.0)
Monocytes Absolute: 1.4 10*3/uL — ABNORMAL HIGH (ref 0.1–1.0)
Neutrophils Relative %: 93 % — ABNORMAL HIGH (ref 43–77)

## 2012-12-20 LAB — COMPREHENSIVE METABOLIC PANEL
ALT: 15 U/L (ref 0–35)
AST: 23 U/L (ref 0–37)
Albumin: 3.3 g/dL — ABNORMAL LOW (ref 3.5–5.2)
Alkaline Phosphatase: 110 U/L (ref 39–117)
CO2: 28 mEq/L (ref 19–32)
Chloride: 95 mEq/L — ABNORMAL LOW (ref 96–112)
GFR calc non Af Amer: 60 mL/min — ABNORMAL LOW (ref >90)
Potassium: 2.5 mEq/L — CL (ref 3.5–5.1)
Sodium: 138 mEq/L (ref 135–145)
Total Bilirubin: 1.1 mg/dL (ref 0.3–1.2)

## 2012-12-20 LAB — URINE MICROSCOPIC-ADD ON

## 2012-12-20 LAB — CG4 I-STAT (LACTIC ACID)
Lactic Acid, Venous: 4.48 mmol/L — ABNORMAL HIGH (ref 0.5–2.2)
Lactic Acid, Venous: 2.07 mmol/L (ref 0.5–2.2)

## 2012-12-20 LAB — POCT I-STAT 3, ART BLOOD GAS (G3+)
Patient temperature: 101.8
pCO2 arterial: 46.1 mmHg — ABNORMAL HIGH (ref 35.0–45.0)
pH, Arterial: 7.44 (ref 7.350–7.450)

## 2012-12-20 LAB — POCT I-STAT TROPONIN I: Troponin i, poc: 0.02 ng/mL (ref 0.00–0.08)

## 2012-12-20 MED ORDER — SODIUM CHLORIDE 0.9 % IV BOLUS (SEPSIS)
1000.0000 mL | Freq: Once | INTRAVENOUS | Status: AC
  Administered 2012-12-20: 1000 mL via INTRAVENOUS

## 2012-12-20 MED ORDER — PIPERACILLIN-TAZOBACTAM 3.375 G IVPB
3.3750 g | Freq: Three times a day (TID) | INTRAVENOUS | Status: DC
  Administered 2012-12-21 – 2012-12-25 (×13): 3.375 g via INTRAVENOUS
  Filled 2012-12-20 (×18): qty 50

## 2012-12-20 MED ORDER — SODIUM CHLORIDE 0.9 % IV SOLN: 500.0000 mL | Freq: Once | INTRAVENOUS | Status: DC

## 2012-12-20 MED ORDER — ACETAMINOPHEN 325 MG PO TABS: 650.0000 mg | ORAL_TABLET | Freq: Once | ORAL | Status: DC

## 2012-12-20 MED ORDER — POTASSIUM CHLORIDE 10 MEQ/100ML IV SOLN
10.0000 meq | Freq: Once | INTRAVENOUS | Status: AC
  Administered 2012-12-20: 10 meq via INTRAVENOUS

## 2012-12-20 MED ORDER — PIPERACILLIN-TAZOBACTAM 3.375 G IVPB
3.3750 g | Freq: Once | INTRAVENOUS | Status: AC
  Administered 2012-12-20: 3.375 g via INTRAVENOUS
  Filled 2012-12-20: qty 50

## 2012-12-20 MED ORDER — ACETAMINOPHEN 650 MG RE SUPP
650.0000 mg | Freq: Once | RECTAL | Status: AC
  Administered 2012-12-20: 650 mg via RECTAL
  Filled 2012-12-20: qty 1

## 2012-12-20 MED ORDER — POTASSIUM CHLORIDE 10 MEQ/100ML IV SOLN
10.0000 meq | Freq: Once | INTRAVENOUS | Status: AC
  Administered 2012-12-20: 10 meq via INTRAVENOUS
  Filled 2012-12-20 (×2): qty 100

## 2012-12-20 MED ORDER — POTASSIUM CHLORIDE 10 MEQ/100ML IV SOLN
10.0000 meq | Freq: Once | INTRAVENOUS | Status: AC
  Administered 2012-12-20: 10 meq via INTRAVENOUS
  Filled 2012-12-20: qty 100

## 2012-12-20 MED ORDER — MORPHINE SULFATE 4 MG/ML IJ SOLN: 4.0000 mg | INTRAMUSCULAR | Status: DC | PRN

## 2012-12-20 MED ORDER — ONDANSETRON HCL 4 MG PO TABS: 4.0000 mg | ORAL_TABLET | Freq: Four times a day (QID) | ORAL | Status: DC | PRN

## 2012-12-20 MED ORDER — IOHEXOL 300 MG/ML  SOLN
100.0000 mL | Freq: Once | INTRAMUSCULAR | Status: AC | PRN
  Administered 2012-12-20: 100 mL via INTRAVENOUS

## 2012-12-20 MED ORDER — VANCOMYCIN HCL 500 MG IV SOLR
500.0000 mg | Freq: Once | INTRAVENOUS | Status: AC
  Administered 2012-12-21: 500 mg via INTRAVENOUS
  Filled 2012-12-20: qty 500

## 2012-12-20 MED ORDER — SODIUM CHLORIDE 0.9 % IV SOLN
1250.0000 mg | INTRAVENOUS | Status: DC
  Filled 2012-12-20: qty 1250

## 2012-12-20 MED ORDER — VANCOMYCIN HCL IN DEXTROSE 1-5 GM/200ML-% IV SOLN
1000.0000 mg | Freq: Once | INTRAVENOUS | Status: AC
  Administered 2012-12-20: 1000 mg via INTRAVENOUS
  Filled 2012-12-20: qty 200

## 2012-12-20 MED ORDER — DIPHENHYDRAMINE HCL 25 MG PO CAPS: 25.0000 mg | ORAL_CAPSULE | Freq: Once | ORAL | Status: DC

## 2012-12-20 MED ORDER — POTASSIUM CHLORIDE 10 MEQ/100ML IV SOLN
10.0000 meq | INTRAVENOUS | Status: AC
  Administered 2012-12-21 (×4): 10 meq via INTRAVENOUS
  Filled 2012-12-20: qty 100
  Filled 2012-12-20: qty 300

## 2012-12-20 MED ORDER — ONDANSETRON HCL 4 MG/2ML IJ SOLN
4.0000 mg | Freq: Four times a day (QID) | INTRAMUSCULAR | Status: DC | PRN
  Administered 2012-12-23: 4 mg via INTRAVENOUS
  Filled 2012-12-20 (×2): qty 2

## 2012-12-20 MED ORDER — SODIUM CHLORIDE 0.9 % IJ SOLN
3.0000 mL | Freq: Two times a day (BID) | INTRAMUSCULAR | Status: DC
  Administered 2012-12-21 – 2012-12-26 (×9): 3 mL via INTRAVENOUS

## 2012-12-20 MED ORDER — SODIUM CHLORIDE 0.9 % IV SOLN
INTRAVENOUS | Status: AC
  Administered 2012-12-21: 04:00:00 via INTRAVENOUS

## 2012-12-20 MED ORDER — METOPROLOL TARTRATE 1 MG/ML IV SOLN
2.5000 mg | Freq: Four times a day (QID) | INTRAVENOUS | Status: DC
  Administered 2012-12-21: 2.5 mg via INTRAVENOUS
  Filled 2012-12-20 (×5): qty 5

## 2012-12-20 MED ORDER — PANTOPRAZOLE SODIUM 40 MG IV SOLR
40.0000 mg | Freq: Every day | INTRAVENOUS | Status: DC
  Filled 2012-12-20: qty 40

## 2012-12-20 NOTE — ED Notes (Signed)
Pt remains very lethargic. Responds to voice. Slow to follow commands. Maintaining good airway. 96% 2L . Pt on cardiac monitor.

## 2012-12-20 NOTE — ED Notes (Signed)
Admitting MD at bedside.

## 2012-12-20 NOTE — ED Provider Notes (Signed)
History     CSN: [id number]  Arrival date & time 12/20/12  1640   First MD Initiated Contact with Patient 12/20/12 1644      Chief Complaint  Patient presents with  . Abdominal Pain    (Consider location/radiation/quality/duration/timing/severity/associated sxs/prior treatment) Patient is a 79 y.o. female presenting with abdominal pain. The history is provided by the patient, the EMS personnel and medical records. No language interpreter was used.  Abdominal Pain Pain location:  RLQ Pain severity:  Unable to specify Onset quality:  Gradual Chronicity:  New  [name] [name] is a 79 y.o. female  with a hx of hypertension, venous ulcer, femoral cytopenia, CAD, and NSTEMI  presents to the Emergency Department with gradual, persistent, progressively worsening right lower quadrant abdominal pain beginning last night. Per EMS patient from [facility]  The to the patient's pain with 0.5 of Phenergan and 15 mg of oxycodone. Patient not lethargic, altered mental status was admitted to palpation of the abdomen. The nursing home reported one episode of vomiting this morning but no diarrhea.  EMS also reports patient is hypertensive and diaphoretic, tachycardic with multiple PVCs and hypoxic on their arrival.  Patient is a level V caveat secondary to altered mental status.  Past Medical History  Diagnosis Date  . HTN (hypertension)   . DJD (degenerative joint disease)   . Osteopenia   . Dyslipidemia   . Venous ulcer   . Thrombocytopenia   . CAD (coronary artery disease)     a. NSTEMI 2/14 => LHC 10/20/12:  dLM 20%, pLAD 99%, mLAD 60% followed by 99%, oD1 99%, and pD2 30%, mild plaque disease in the CFX and RCA. PCI: Xience Xpedition DES to the proximal and mid LAD  . Hx of echocardiogram     a. Echo 10/18/12: mod LVH, EF 60-65%, Gr 1 diast dysfn, mild AI, mild LAE.    Past Surgical History  Procedure Laterality Date  . Back surgery      Lumbar disc  . Hip surgery      R THR  . Eye  surgery      Blepharoplasty  . Insertion / placement / revision neurostimulator      Family History  Problem Relation Age of Onset  . CAD Father 63    Vague history     History  Substance Use Topics  . Smoking status: Never Smoker   . Smokeless tobacco: Not on file  . Alcohol Use: No    OB History   Grav Para Term Preterm Abortions TAB SAB Ect Mult Living                  Review of Systems  Unable to perform ROS: Mental status change  Gastrointestinal: Positive for abdominal pain.    Allergies  Review of patient's allergies indicates no known allergies.  Home Medications   No current outpatient prescriptions on file.  BP 159/77  Pulse 93  Temp(Src) 99 F (37.2 C) (Oral)  Resp 22  Ht _0  (1.575 m)  Wt 197 lb 8.5 oz (89.6 kg)  BMI 36.12 kg/m2  SpO2 94%  Physical Exam  Nursing note and vitals reviewed. Constitutional: She appears well-developed and well-nourished. No distress.  Hot to the touch, flushed  HENT:  Head: Normocephalic and atraumatic.  Nose: Nose normal.  Mouth/Throat: Uvula is midline and oropharynx is clear and moist. No edematous. No oropharyngeal exudate, posterior oropharyngeal edema or posterior oropharyngeal erythema.  Eyes: Conjunctivae are normal. Pupils are  equal, round, and reactive to light. No scleral icterus.  Neck: Normal range of motion. Neck supple.  Cardiovascular: Regular rhythm, normal heart sounds and intact distal pulses.  Tachycardia present.   Pulses:      Radial pulses are 2+ on the right side, and 3+ on the left side.       Dorsalis pedis pulses are 2+ on the right side.       Posterior tibial pulses are 2+ on the right side, and 2+ on the left side.  Pulmonary/Chest: Effort normal and breath sounds normal. No respiratory distress. She has no wheezes.  Abdominal: Soft. Normal appearance and bowel sounds are normal. She exhibits no mass. There is tenderness in the right lower quadrant. There is guarding. There is no  rebound.  Musculoskeletal: Normal range of motion. She exhibits no edema.  Lymphadenopathy:    She has no cervical adenopathy.  Neurological: She is alert.  Patient with altered mental status, able to orient x3 but answers questions very slowly  Skin: Skin is warm. She is diaphoretic.  Venous stasis changes noted to the bilateral lower legs, no evidence of cellulitis  Psychiatric: She has a normal mood and affect.    ED Course  Procedures (including critical care time)  Labs Reviewed  CBC WITH DIFFERENTIAL - Abnormal; Notable for the following:    WBC 34.5 (*)    RBC 5.57 (*)    Hemoglobin 18.2 (*)    HCT 49.4 (*)    MCHC 36.8 (*)    Platelets 42 (*)    Neutrophils Relative 93 (*)    Lymphocytes Relative 3 (*)    Neutro Abs 32.1 (*)    Monocytes Absolute 1.4 (*)    All other components within normal limits  COMPREHENSIVE METABOLIC PANEL - Abnormal; Notable for the following:    Potassium 2.5 (*)    Chloride 95 (*)    Glucose, Bld 223 (*)    Albumin 3.3 (*)    GFR calc non Af Amer 60 (*)    GFR calc Af Amer 70 (*)    All other components within normal limits  URINALYSIS, ROUTINE W REFLEX MICROSCOPIC - Abnormal; Notable for the following:    Protein, ur 100 (*)    All other components within normal limits  URINE MICROSCOPIC-ADD ON - Abnormal; Notable for the following:    Casts HYALINE CASTS (*)    All other components within normal limits  CG4 I-STAT (LACTIC ACID) - Abnormal; Notable for the following:    Lactic Acid, Venous 4.48 (*)    All other components within normal limits  POCT I-STAT 3, BLOOD GAS (G3+) - Abnormal; Notable for the following:    pCO2 arterial 46.1 (*)    Bicarbonate 30.8 (*)    Acid-Base Excess 6.0 (*)    All other components within normal limits  MRSA PCR SCREENING  CULTURE, BLOOD (ROUTINE X 2)  CULTURE, BLOOD (ROUTINE X 2)  URINE CULTURE  PROCALCITONIN  LIPASE, BLOOD  BLOOD GAS, ARTERIAL  CBC  MAGNESIUM  PHOSPHORUS  TSH  COMPREHENSIVE  METABOLIC PANEL  CBC  TROPONIN I  TROPONIN I  TROPONIN I  HEMOGLOBIN B5Z  BASIC METABOLIC PANEL  POCT I-STAT TROPONIN I  CG4 I-STAT (LACTIC ACID)  TYPE AND SCREEN  PREPARE PLATELET PHERESIS   Ct Abdomen Pelvis W Contrast  12/20/2012  *RADIOLOGY REPORT*  Clinical Data: Abdominal pain.  Vomiting.  History of hypertension.  CT ABDOMEN AND PELVIS WITH CONTRAST  Technique:  Multidetector  CT imaging of the abdomen and pelvis was performed following the standard protocol during bolus administration of intravenous contrast.  Contrast: 124m OMNIPAQUE IOHEXOL 300 MG/ML  SOLN CT of the chest 10/17/2012  Comparison: CT of the chest 10/17/2012  Findings: Lung bases show posterior consolidations bilaterally, associated with air bronchograms and small effusions.  The heart is mildly enlarged.  Coronary stent is identified.  There is significant artifact from spinal stimulator.  There is a small amount of ascites surrounding the liver. Periportal edema is noted. There is marked enlargement of the gallbladder.  Gallbladder measures greater than 12.0 x 5.4 cm. Dependent layering of high attenuation material within the gallbladder is consistent with small stones or layering sludge. Within the region of the gallbladder neck, there is a high attenuation structure which measures 1.7 cm.  This is less apparent on the delayed images.  It is not clear whether this represents a gallstone or lymph node in the porta hepatis.  This is best seen on image number 28.  There is pericholecystic fluid or gallbladder wall thickening.  No focal abnormality identified within the spleen.  There is a left renal cysts.  There are patchy areas of renal parenchymal thinning bilaterally, left greater than right.  No focal abnormality identified within the pancreas.  The stomach and small bowel loops are normal in appearance. Colonic loops are normal in caliber and wall thickness.  There is moderate stool within the rectosigmoid colon. Small  umbilical hernia contains only mesenteric fat.  The urinary bladder is markedly distended.  The uterus is present. No evidence for adnexal mass or free pelvic fluid. No evidence for aortic aneurysm.  Degenerative changes are seen throughout the lower thoracic and lumbar spine.  Patient has had multilevel posterior fusion, creating significant artifact. Prior right hip  IMPRESSION:  1.  Bilateral lower lobe infiltrates and small effusions. 2.  Coronary stent. 3.  Small amount of ascites.  4.  Distended gallbladder with pericholecystic fluid or gallbladder wall thickening. 5.  Suspect a layering gallstones or debris/sludge. 6.  Marked distension of the urinary bladder. 7.  Small umbilical hernia, containing mesenteric fat.  The findings were discussed with HJarrett SohoMuthersbaugh on 12/20/2012 at 7:30 p.m.   Original Report Authenticated By: ENolon Nations M.D.    Dg Chest Portable 1 View  12/20/2012  *RADIOLOGY REPORT*  Clinical Data: Tachycardia.  PORTABLE CHEST - 1 VIEW  Comparison: 10/17/2012  Findings: Lung volumes are extremely low with bibasilar atelectasis present.  No overt edema or consolidation.  No pleural fluid identified.  Stable appearance of the thoracic spine stimulator device.  Stable advanced degenerative changes of the left glenohumeral joint.  IMPRESSION: Low volumes with bibasilar atelectasis.   Original Report Authenticated By: GAletta Edouard M.D.     ECG 16:38:  Date: 12/20/2012  Rate: 124  Rhythm: sinus tachycardia and premature atrial contractions (PAC)  QRS Axis: normal  Intervals: normal  ST/T Wave abnormalities: nonspecific ST changes  Conduction Disutrbances:none  Narrative Interpretation: Pt with St depression I and aVL and ST elevation in III, but no clear ST elevation in the lateral leads, changes new from previous ECG will repeat as HR decreases  Old EKG Reviewed: changes noted   ECG 17:54:  Date: 12/20/2012  Rate: 103  Rhythm: sinus tachycardia and premature atrial  contractions (PAC)  QRS Axis: normal  Intervals: normal  ST/T Wave abnormalities: nonspecific ST changes  Conduction Disutrbances:none  Narrative Interpretation: Patient with persistent ST depression in lead I, no further ST  elevation or depression noted, compared to previous ECG today it appears as if changes seen in the first were from demand ischemia.   Old EKG Reviewed: changes noted    1. Sepsis   2. HTN (hypertension)   3. Hypokalemia   4. Abdominal pain   5. Thrombocytopenia   6. Cholecystitis, acute   7. CAD (coronary artery disease) of artery bypass graft     CRITICAL CARE Performed by: Abigail Butts   Total critical care time: 1 hour  Critical care time was exclusive of separately billable procedures and treating other patients.  Critical care was necessary to treat or prevent imminent or life-threatening deterioration.  Critical care was time spent personally by me on the following activities: development of treatment plan with patient and/or surrogate as well as nursing, discussions with consultants, evaluation of patient's response to treatment, examination of patient, obtaining history from patient or surrogate, ordering and performing treatments and interventions, ordering and review of laboratory studies, ordering and review of radiographic studies, pulse oximetry and re-evaluation of patient's condition.   MDM  Geneieve H Lawes presents from the nursing home abdominal pain, fever, tachycardia and altered mental status. Significant concern for sepsis.  ECG with concerning changes considering ACS, vs rate or hypoxia related ischemia, also considering PE.    5:45PM Patient with significant leukocytosis at 34.5, elevated lactic acid 4.48 and pelvic pain. Differential still with significant concern for sepsis at this point considering abdominal etiology including dead gut, mesenteric ischemia, diverticulitis, perforated bowel and appendicitis.  Chest x-ray  without clear evidence of pneumonia I think this is unlikely source for her sepsis. Patient without urinary tract infection on cath specimen and this is not urosepsis. Patient begun on empiric antibiotics, vancomycin and Zosyn. Repeat EKG appears to be demand ischemia.    6:15 PM Patient with hypokalemia at 2.5 we'll replace here in the department with IV potassium. Patient with hyperglycemia and 223 and anion gap of 15.  Patient to CT with IV contrast only as she will be unable to tolerate by mouth.  7:29 PM CT discussed with Radiologist and with questionable Pneumonia, possibly bilateral.  Evidence of acute cholecystitis with stranding.  Likely gangrenous in nature.  Will consult with surgery as this is likely the source of her sepsis.      8:00PM Consult with Dr Hulen Skains who will assess and likely place percutaneous drain, but does not want to operate.  Discussed with [facility] who is willing to admit but would like critical care to evaluate the patient as well.  Will check ABG and repeat lactic acid.    9:15PM Pt with Bicarb of 30.8 and CO2 of 46.1.  Lactic acid decreased to 2.07.  Pt will be admitted by [facility] to a stepdown bed with surgery consult.    Jarrett Soho Trejon Duford, PA-C 12/21/12 0121

## 2012-12-20 NOTE — ED Notes (Signed)
Dr Hulen Skains at bedside.

## 2012-12-20 NOTE — ED Notes (Addendum)
Per EMS: Pt from [facility] with c/o severe diffuse abdominal pain since last night. Pt reports 1 episode of vomiting this AM.  Home gave pt 15 mg oxycodone at 1430, 12.5 phenergan at 1030 today for pain. Pt now lethargic, tender on palpation to abdomen. No rigidity. Denies diarrhea. AO x4. Cellulitis noted to bilateral legs. Clammy to touch. Neuro intact.  190/100. 140 ST with PVC's. 88% RA. Placed on 4 L to 93 %. 258 BG. DNR

## 2012-12-20 NOTE — H&P (Signed)
PCP:   Mathews Argyle, MD   [facility]   Chief Complaint:   Abdominal pain  HPI: [name] [name] is a 79 y.o. female   has a past medical history of HTN (hypertension); DJD (degenerative joint disease); Osteopenia; Dyslipidemia; Venous ulcer; Thrombocytopenia; CAD (coronary artery disease); and echocardiogram.   Presented with  Confusion since yesterday and abdominal pain. She lives at the [facility] home baseline wheelchair-bound secondary to severe back pain. She was brought in to emergency department [place] it was found to have cholecystitis and likely sepsis with fever and tachycardia. Dr. Hulen Skains with general surgery has been called but at this point feels that she's not an operative candidate. Instead interventional radiology was consulted to place a percutaneous biliary drain. Medicine to admit to step down for treatment of sepsis and antibiotic coverage. In emergency department patient received vancomycin and Zosyn. In emergency department and was also noted that she is thrombocytopenic  which is chronic she will need a platelet transfusion prior to procedures. Patient has history of coronary disease with DES stenting to her LAD in 2/3/ 2014. She is currently on Brillinta. [facility] is been notified I spoke to the fellow on call. Per cardiology in sittuation of emergent case Brillinta will have to be held until it is deemed safe to restart it by IR after percutaneous drain has been placed.     Review of Systems:    Pertinent positives include: Fevers, chills, fatigue, confusion, abdominal pain,   Constitutional:  No weight loss, night sweats, weight loss  HEENT:  No headaches, Difficulty swallowing,Tooth/dental problems,Sore throat,  No sneezing, itching, ear ache, nasal congestion, post nasal drip,  Cardio-vascular:  No chest pain, Orthopnea, PND, anasarca, dizziness, palpitations.no Bilateral lower extremity swelling  GI:  No heartburn, indigestion,nausea, vomiting,  diarrhea, change in bowel habits, loss of appetite, melena, blood in stool, hematemesis Resp:  no shortness of breath at rest. No dyspnea on exertion, No excess mucus, no productive cough, No non-productive cough, No coughing up of blood.No change in color of mucus.No wheezing. Skin:  no rash or lesions. No jaundice GU:  no dysuria, change in color of urine, no urgency or frequency. No straining to urinate.  No flank pain.  Musculoskeletal:  No joint pain or no joint swelling. No decreased range of motion. No back pain.  Psych:  No change in mood or affect. No depression or anxiety. No memory loss.  Neuro: no localizing neurological complaints, no tingling, no weakness, no double vision, no gait abnormality, no slurred speech, no   Otherwise ROS are negative except for above, 10 systems were reviewed  Past Medical History: Past Medical History  Diagnosis Date  . HTN (hypertension)   . DJD (degenerative joint disease)   . Osteopenia   . Dyslipidemia   . Venous ulcer   . Thrombocytopenia   . CAD (coronary artery disease)     a. NSTEMI 2/14 => LHC 10/20/12:  dLM 20%, pLAD 99%, mLAD 60% followed by 99%, oD1 99%, and pD2 30%, mild plaque disease in the CFX and RCA. PCI: Xience Xpedition DES to the proximal and mid LAD  . Hx of echocardiogram     a. Echo 10/18/12: mod LVH, EF 60-65%, Gr 1 diast dysfn, mild AI, mild LAE.   Past Surgical History  Procedure Laterality Date  . Back surgery      Lumbar disc  . Hip surgery      R THR  . Eye surgery  Blepharoplasty  . Insertion / placement / revision neurostimulator       Medications: Prior to Admission medications   Medication Sig Start Date End Date Taking? Authorizing Provider  acidophilus (RISAQUAD) CAPS Take 1 capsule by mouth daily.   Yes Historical Provider, MD  amLODipine (NORVASC) 2.5 MG tablet Take 1 tablet (2.5 mg total) by mouth daily. 10/21/12  Yes Sorin June Leap, MD  anti-nausea (EMETROL) solution Take 15 mLs by mouth  every 15 (fifteen) minutes as needed for nausea.   Yes Historical Provider, MD  aspirin 81 MG tablet Take 81 mg by mouth daily.   Yes Historical Provider, MD  atorvastatin (LIPITOR) 80 MG tablet Take 80 mg by mouth daily. RX is written for 6pm. Nursing Home administers at [place] Provider, MD  Calcium Carbonate-Vitamin D (CALCARB 600/D PO) Take 2 tablets by mouth daily.   Yes Historical Provider, MD  fluticasone (FLONASE) 50 MCG/ACT nasal spray Place 2 sprays into the nose daily.   Yes Historical Provider, MD  furosemide (LASIX) 80 MG tablet Take 80 mg by mouth 2 (two) times daily.    Yes Historical Provider, MD  gabapentin (NEURONTIN) 800 MG tablet Take 800 mg by mouth 4 (four) times daily.   Yes Historical Provider, MD  glucosamine-chondroitin 500-400 MG tablet Take 2 tablets by mouth daily.   Yes Historical Provider, MD  metoprolol (LOPRESSOR) 50 MG tablet Take 1 tablet (50 mg total) by mouth 2 (two) times daily. Also with 25 mg tab to = 75 mg twice daily 11/05/12  Yes Scott T Weaver, PA-C  metoprolol tartrate (LOPRESSOR) 25 MG tablet Take 1 tablet (25 mg total) by mouth 2 (two) times daily. With 50 mg tab twice daily = 75 mg twice daily 11/05/12  Yes Scott T Kathlen Mody, PA-C  morphine (MS CONTIN) 100 MG 12 hr tablet Take 100 mg by mouth 2 (two) times daily.   Yes Historical Provider, MD  Multiple Vitamin (MULTIVITAMIN WITH MINERALS) TABS Take 1 tablet by mouth daily.   Yes Historical Provider, MD  oxyCODONE (ROXICODONE) 15 MG immediate release tablet Take 15 mg by mouth every 4 (four) hours as needed. For pain .  Max 8 tabs in 24 hrs   Yes Historical Provider, MD  potassium chloride (K-DUR) 10 MEQ tablet Take 10 mEq by mouth 2 (two) times daily.   Yes Historical Provider, MD  promethazine (PHENERGAN) 12.5 MG tablet Take 12.5 mg by mouth every 6 (six) hours as needed for nausea.   Yes Historical Provider, MD  saccharomyces boulardii (FLORASTOR) 250 MG capsule Take 250 mg by mouth 2 (two)  times daily.   Yes Historical Provider, MD  Ticagrelor (BRILINTA) 90 MG TABS tablet Take 1 tablet (90 mg total) by mouth 2 (two) times daily. 10/21/12  Yes Sorin June Leap, MD  vitamin B-12 (CYANOCOBALAMIN) 1000 MCG tablet Take 1,000 mcg by mouth daily.   Yes Historical Provider, MD  vitamin C (ASCORBIC ACID) 500 MG tablet Take 500 mg by mouth daily.   Yes Historical Provider, MD    Allergies:  No Known Allergies  Social History:  Ambulatory wheelchair bound  Lives at [facility] home Son: Alen Burright [phone]   reports that she has never smoked. She does not have any smokeless tobacco history on file. She reports that she does not drink alcohol or use illicit drugs.   Family History: family history includes CAD (age of onset: 68) in her father.    Physical Exam: Patient Vitals  for the past 24 hrs:  BP Temp Temp src Pulse Resp SpO2  12/20/12 2100 158/79 mmHg - - 88 25 95 %  12/20/12 2000 162/74 mmHg - - 85 20 94 %  12/20/12 1834 199/88 mmHg - - 102 20 95 %  12/20/12 1801 181/86 mmHg - - 98 - 96 %  12/20/12 1800 181/86 mmHg - - 99 23 96 %  12/20/12 1700 197/98 mmHg - - 123 36 94 %  12/20/12 1642 191/102 mmHg 101.8 F (38.8 C) Rectal 124 22 93 %    1. General:  in No Acute distress 2. Psychological: Somnolent but Oriented to self and situation 3. Head/ENT:  Dry Mucous Membranes                          Head Non traumatic, neck supple                          Normal Dentition 4. SKIN:  decreased Skin turgor,  Skin clean Dry and intact numerous bruises present 5. Heart: Regular rate and rhythm no Murmur, Rub or gallop 6. Lungs: Clear to auscultation bilaterally, no wheezes or crackles   7. Abdomen: Soft, some RUQ tenderness noted, Non distended, no peritoneal signs no rebound somewhat diminished bowel sounds 8. Lower extremities: no clubbing, cyanosis, trace edema 9. Neurologically Grossly intact, moving all 4 extremities equally 10. MSK: Normal range of motion  body mass  index is unknown because there is no weight on file.   Labs on Admission:   Recent Labs  12/20/12 1706  NA 138  K 2.5*  CL 95*  CO2 28  GLUCOSE 223*  BUN 15  CREATININE 0.89  CALCIUM 9.5    Recent Labs  12/20/12 1706  AST 23  ALT 15  ALKPHOS 110  BILITOT 1.1  PROT 7.2  ALBUMIN 3.3*    Recent Labs  12/20/12 1706  LIPASE 12    Recent Labs  12/20/12 1706  WBC 34.5*  NEUTROABS 32.1*  HGB 18.2*  HCT 49.4*  MCV 88.7  PLT 42*   No results found for this basename: CKTOTAL, CKMB, CKMBINDEX, TROPONINI,  in the last 72 hours No results found for this basename: TSH, T4TOTAL, FREET3, T3FREE, THYROIDAB,  in the last 72 hours No results found for this basename: VITAMINB12, FOLATE, FERRITIN, TIBC, IRON, RETICCTPCT,  in the last 72 hours No results found for this basename: HGBA1C    The CrCl is unknown because both a height and weight (above a minimum accepted value) are required for this calculation. ABG    Component Value Date/Time   PHART 7.440 12/20/2012 2111   HCO3 30.8* 12/20/2012 2111   TCO2 32 12/20/2012 2111   O2SAT 97.0 12/20/2012 2111     Lab Results  Component Value Date   DDIMER 1.03* 10/17/2012     Other results:  I have pearsonaly reviewed this: ECG REPORT  Rate:124  Rhythm: ST ST&T Change: possible anterior septal ischemic changes Repeat ECG 103 ST segment changes resolved  UA no evidence of infection   Cultures:    Component Value Date/Time   SDES WOUND  ANKLE, RIGHT 07/30/2007 1227   SPECREQUEST  IMMUNE:NORM 07/30/2007 1227   CULT  Value: FEW STAPHYLOCOCCUS AUREUS Note: RIFAMPIN AND GENTAMICIN SHOULD NOT BE USED AS SINGLE DRUGS FOR TREATMENT OF STAPH INFECTIONS. 07/30/2007 1227   REPTSTATUS 08/02/2007 FINAL 07/30/2007 1227       Radiological  Exams on Admission: Ct Abdomen Pelvis W Contrast  12/20/2012  *RADIOLOGY REPORT*  Clinical Data: Abdominal pain.  Vomiting.  History of hypertension.  CT ABDOMEN AND PELVIS WITH CONTRAST  Technique:   Multidetector CT imaging of the abdomen and pelvis was performed following the standard protocol during bolus administration of intravenous contrast.  Contrast: 126m OMNIPAQUE IOHEXOL 300 MG/ML  SOLN CT of the chest 10/17/2012  Comparison: CT of the chest 10/17/2012  Findings: Lung bases show posterior consolidations bilaterally, associated with air bronchograms and small effusions.  The heart is mildly enlarged.  Coronary stent is identified.  There is significant artifact from spinal stimulator.  There is a small amount of ascites surrounding the liver. Periportal edema is noted. There is marked enlargement of the gallbladder.  Gallbladder measures greater than 12.0 x 5.4 cm. Dependent layering of high attenuation material within the gallbladder is consistent with small stones or layering sludge. Within the region of the gallbladder neck, there is a high attenuation structure which measures 1.7 cm.  This is less apparent on the delayed images.  It is not clear whether this represents a gallstone or lymph node in the porta hepatis.  This is best seen on image number 28.  There is pericholecystic fluid or gallbladder wall thickening.  No focal abnormality identified within the spleen.  There is a left renal cysts.  There are patchy areas of renal parenchymal thinning bilaterally, left greater than right.  No focal abnormality identified within the pancreas.  The stomach and small bowel loops are normal in appearance. Colonic loops are normal in caliber and wall thickness.  There is moderate stool within the rectosigmoid colon. Small umbilical hernia contains only mesenteric fat.  The urinary bladder is markedly distended.  The uterus is present. No evidence for adnexal mass or free pelvic fluid. No evidence for aortic aneurysm.  Degenerative changes are seen throughout the lower thoracic and lumbar spine.  Patient has had multilevel posterior fusion, creating significant artifact. Prior right hip  IMPRESSION:  1.   Bilateral lower lobe infiltrates and small effusions. 2.  Coronary stent. 3.  Small amount of ascites.  4.  Distended gallbladder with pericholecystic fluid or gallbladder wall thickening. 5.  Suspect a layering gallstones or debris/sludge. 6.  Marked distension of the urinary bladder. 7.  Small umbilical hernia, containing mesenteric fat.  The findings were discussed with HJarrett SohoMuthersbaugh on 12/20/2012 at 7:30 p.m.   Original Report Authenticated By: ENolon Nations M.D.    Dg Chest Portable 1 View  12/20/2012  *RADIOLOGY REPORT*  Clinical Data: Tachycardia.  PORTABLE CHEST - 1 VIEW  Comparison: 10/17/2012  Findings: Lung volumes are extremely low with bibasilar atelectasis present.  No overt edema or consolidation.  No pleural fluid identified.  Stable appearance of the thoracic spine stimulator device.  Stable advanced degenerative changes of the left glenohumeral joint.  IMPRESSION: Low volumes with bibasilar atelectasis.   Original Report Authenticated By: GAletta Edouard M.D.     Chart has been reviewed  Assessment/Plan  79yo F history of coronary artery disease hypertension and chronic back pain presents with abdominal pain and evidence of cholecystitis on CT scan of abdomen.   Present on Admission:  . Cholecystitis, acute - continue IV antibiotics vancomycin and Zosyn. Admit to step down and watch closely  . Sepsis - lactate improved we'll continue with IV antibiotics, fluids, [facility] help [facility] patient.  . Hypokalemia - will replace and repeat BP meds  . HTN (  hypertension) - patient is currently n.p.o. once able to restart her medications and sores blood pressure stable will do so. For now to avoid rebound tachycardia will give metoprolol IV and hold for low blood pressure  . Chronic back pain - morphine PRN . CAD (coronary artery disease) of artery bypass graft - hold Brillinta patient requiring procedures, interventional radiology to recommend when it is safe to restart  it. Continue beta-blockade this patient apparently had possible demand ischemic changes on her EKG. She is chest pain free but will cycle cardiac markers given EKG changes. Not a candidate for heparinization. Write for metoprolol IV to control heart rate while n.p.o.  . Thrombocytopenia this is chronic -  but will transfuse since patient will need intervention, she is supposed to see hematology as an outpatient   Prophylaxis: SCD  Protonix  CODE STATUS: FULL CODE as per request of her son, patient confirms. Of note she did have a DNR sheet on chart but states she was not aware of it. She is somewhat confused.   Other plan as per orders.  I have spent a total of 100 min on this admission, patient's care has been discussed with cardiology, surgery, interventional radiology, critical care. Spoke extensively with family on the phone.   [place] 12/20/2012, 9:38 PM

## 2012-12-20 NOTE — ED Notes (Signed)
Pt returned from CT. Remains lethargic. States "I feel a little better". VSS. Pt placed on monitor. IV sites assessed.

## 2012-12-20 NOTE — Consult Note (Signed)
Reason for Consult:Acute cholecystitis Referring Physician: Aletta Edmunds is an 79 y.o. female.  HPI: 24-36 hours history of abdominal pain and apparent mental status changes.  Given oxycodone and phenergan at [facility], brought here lethargic and with WBC of 34.5K, fever up to 101.8, RUQ tenderness.  Given Vanco and Zosyn, and now to have percutaneous GB drainage per IR.  Past Medical History  Diagnosis Date  . HTN (hypertension)   . DJD (degenerative joint disease)   . Osteopenia   . Dyslipidemia   . Venous ulcer   . Thrombocytopenia   . CAD (coronary artery disease)     a. NSTEMI 2/14 => LHC 10/20/12:  dLM 20%, pLAD 99%, mLAD 60% followed by 99%, oD1 99%, and pD2 30%, mild plaque disease in the CFX and RCA. PCI: Xience Xpedition DES to the proximal and mid LAD  . Hx of echocardiogram     a. Echo 10/18/12: mod LVH, EF 60-65%, Gr 1 diast dysfn, mild AI, mild LAE.    Past Surgical History  Procedure Laterality Date  . Back surgery      Lumbar disc  . Hip surgery      R THR  . Eye surgery      Blepharoplasty  . Insertion / placement / revision neurostimulator      Family History  Problem Relation Age of Onset  . CAD Father 71    Vague history     Social History:  reports that she has never smoked. She does not have any smokeless tobacco history on file. She reports that she does not drink alcohol. Her drug history is not on file.  Allergies: No Known Allergies  Medications: I have reviewed the patient's current medications.  Results for orders placed during the hospital encounter of 12/20/12 (from the past 48 hour(s))  URINALYSIS, ROUTINE W REFLEX MICROSCOPIC     Status: Abnormal   Collection Time    12/20/12  5:05 PM      Result Value Range   Color, Urine YELLOW  YELLOW   APPearance CLEAR  CLEAR   Specific Gravity, Urine 1.016  1.005 - 1.030   pH 6.0  5.0 - 8.0   Glucose, UA NEGATIVE  NEGATIVE mg/dL   Hgb urine dipstick NEGATIVE  NEGATIVE   Bilirubin Urine NEGATIVE   NEGATIVE   Ketones, ur NEGATIVE  NEGATIVE mg/dL   Protein, ur 100 (*) NEGATIVE mg/dL   Urobilinogen, UA 0.2  0.0 - 1.0 mg/dL   Nitrite NEGATIVE  NEGATIVE   Leukocytes, UA NEGATIVE  NEGATIVE  URINE MICROSCOPIC-ADD ON     Status: Abnormal   Collection Time    12/20/12  5:05 PM      Result Value Range   Squamous Epithelial / LPF RARE  RARE   WBC, UA 0-2  <3 WBC/hpf   RBC / HPF 0-2  <3 RBC/hpf   Bacteria, UA RARE  RARE   Casts HYALINE CASTS (*) NEGATIVE  CBC WITH DIFFERENTIAL     Status: Abnormal   Collection Time    12/20/12  5:06 PM      Result Value Range   WBC 34.5 (*) 4.0 - 10.5 K/uL   RBC 5.57 (*) 3.87 - 5.11 MIL/uL   Hemoglobin 18.2 (*) 12.0 - 15.0 g/dL   HCT 49.4 (*) 36.0 - 46.0 %   MCV 88.7  78.0 - 100.0 fL   MCH 32.7  26.0 - 34.0 pg   MCHC 36.8 (*) 30.0 - 36.0 g/dL  RDW 13.7  11.5 - 15.5 %   Platelets 42 (*) 150 - 400 K/uL   Comment: PLATELET COUNT CONFIRMED BY SMEAR   Neutrophils Relative 93 (*) 43 - 77 %   Lymphocytes Relative 3 (*) 12 - 46 %   Monocytes Relative 4  3 - 12 %   Eosinophils Relative 0  0 - 5 %   Basophils Relative 0  0 - 1 %   Neutro Abs 32.1 (*) 1.7 - 7.7 K/uL   Lymphs Abs 1.0  0.7 - 4.0 K/uL   Monocytes Absolute 1.4 (*) 0.1 - 1.0 K/uL   Eosinophils Absolute 0.0  0.0 - 0.7 K/uL   Basophils Absolute 0.0  0.0 - 0.1 K/uL   Smear Review MORPHOLOGY UNREMARKABLE    COMPREHENSIVE METABOLIC PANEL     Status: Abnormal   Collection Time    12/20/12  5:06 PM      Result Value Range   Sodium 138  135 - 145 mEq/L   Potassium 2.5 (*) 3.5 - 5.1 mEq/L   Comment: CRITICAL RESULT CALLED TO, READ BACK BY AND VERIFIED WITH:     B.BERARD,RN 12/20/12 1810 EHOWARD   Chloride 95 (*) 96 - 112 mEq/L   CO2 28  19 - 32 mEq/L   Glucose, Bld 223 (*) 70 - 99 mg/dL   BUN 15  6 - 23 mg/dL   Creatinine, Ser 0.89  0.50 - 1.10 mg/dL   Calcium 9.5  8.4 - 10.5 mg/dL   Total Protein 7.2  6.0 - 8.3 g/dL   Albumin 3.3 (*) 3.5 - 5.2 g/dL   AST 23  0 - 37 U/L   ALT 15  0 - 35  U/L   Alkaline Phosphatase 110  39 - 117 U/L   Total Bilirubin 1.1  0.3 - 1.2 mg/dL   GFR calc non Af Amer 60 (*) >90 mL/min   GFR calc Af Amer 70 (*) >90 mL/min   Comment:            The eGFR has been calculated     using the CKD EPI equation.     This calculation has not been     validated in all clinical     situations.     eGFR's persistently     <90 mL/min signify     possible Chronic Kidney Disease.  LIPASE, BLOOD     Status: None   Collection Time    12/20/12  5:06 PM      Result Value Range   Lipase 12  11 - 59 U/L  PROCALCITONIN     Status: None   Collection Time    12/20/12  5:10 PM      Result Value Range   Procalcitonin 2.85     Comment:            Interpretation:     PCT > 2 ng/mL:     Systemic infection (sepsis) is likely,     unless other causes are known.     (NOTE)             ICU PCT Algorithm               Non ICU PCT Algorithm        ----------------------------     ------------------------------             PCT < 0.25 ng/mL  PCT < 0.1 ng/mL         Stopping of antibiotics            Stopping of antibiotics           strongly encouraged.               strongly encouraged.        ----------------------------     ------------------------------           PCT level decrease by               PCT < 0.25 ng/mL           >= 80% from peak PCT           OR PCT 0.25 - 0.5 ng/mL          Stopping of antibiotics                                                 encouraged.         Stopping of antibiotics               encouraged.        ----------------------------     ------------------------------           PCT level decrease by              PCT >= 0.25 ng/mL           < 80% from peak PCT            AND PCT >= 0.5 ng/mL            Continuing antibiotics                                                  encouraged.           Continuing antibiotics                encouraged.        ----------------------------     ------------------------------          PCT level increase compared          PCT > 0.5 ng/mL             with peak PCT AND              PCT >= 0.5 ng/mL             Escalation of antibiotics                                              strongly encouraged.          Escalation of antibiotics            strongly encouraged.  POCT I-STAT TROPONIN I     Status: None   Collection Time    12/20/12  5:27 PM      Result Value Range   Troponin i, poc 0.02  0.00 - 0.08 ng/mL   Comment 3  Comment: Due to the release kinetics of cTnI,     a negative result within the first hours     of the onset of symptoms does not rule out     myocardial infarction with certainty.     If myocardial infarction is still suspected,     repeat the test at appropriate intervals.  CG4 I-STAT (LACTIC ACID)     Status: Abnormal   Collection Time    12/20/12  5:34 PM      Result Value Range   Lactic Acid, Venous 4.48 (*) 0.5 - 2.2 mmol/L    Ct Abdomen Pelvis W Contrast  12/20/2012  *RADIOLOGY REPORT*  Clinical Data: Abdominal pain.  Vomiting.  History of hypertension.  CT ABDOMEN AND PELVIS WITH CONTRAST  Technique:  Multidetector CT imaging of the abdomen and pelvis was performed following the standard protocol during bolus administration of intravenous contrast.  Contrast: 172m OMNIPAQUE IOHEXOL 300 MG/ML  SOLN CT of the chest 10/17/2012  Comparison: CT of the chest 10/17/2012  Findings: Lung bases show posterior consolidations bilaterally, associated with air bronchograms and small effusions.  The heart is mildly enlarged.  Coronary stent is identified.  There is significant artifact from spinal stimulator.  There is a small amount of ascites surrounding the liver. Periportal edema is noted. There is marked enlargement of the gallbladder.  Gallbladder measures greater than 12.0 x 5.4 cm. Dependent layering of high attenuation material within the gallbladder is consistent with small stones or layering sludge. Within the region of the gallbladder  neck, there is a high attenuation structure which measures 1.7 cm.  This is less apparent on the delayed images.  It is not clear whether this represents a gallstone or lymph node in the porta hepatis.  This is best seen on image number 28.  There is pericholecystic fluid or gallbladder wall thickening.  No focal abnormality identified within the spleen.  There is a left renal cysts.  There are patchy areas of renal parenchymal thinning bilaterally, left greater than right.  No focal abnormality identified within the pancreas.  The stomach and small bowel loops are normal in appearance. Colonic loops are normal in caliber and wall thickness.  There is moderate stool within the rectosigmoid colon. Small umbilical hernia contains only mesenteric fat.  The urinary bladder is markedly distended.  The uterus is present. No evidence for adnexal mass or free pelvic fluid. No evidence for aortic aneurysm.  Degenerative changes are seen throughout the lower thoracic and lumbar spine.  Patient has had multilevel posterior fusion, creating significant artifact. Prior right hip  IMPRESSION:  1.  Bilateral lower lobe infiltrates and small effusions. 2.  Coronary stent. 3.  Small amount of ascites.  4.  Distended gallbladder with pericholecystic fluid or gallbladder wall thickening. 5.  Suspect a layering gallstones or debris/sludge. 6.  Marked distension of the urinary bladder. 7.  Small umbilical hernia, containing mesenteric fat.  The findings were discussed with HJarrett SohoMuthersbaugh on 12/20/2012 at 7:30 p.m.   Original Report Authenticated By: ENolon Nations M.D.    Dg Chest Portable 1 View  12/20/2012  *RADIOLOGY REPORT*  Clinical Data: Tachycardia.  PORTABLE CHEST - 1 VIEW  Comparison: 10/17/2012  Findings: Lung volumes are extremely low with bibasilar atelectasis present.  No overt edema or consolidation.  No pleural fluid identified.  Stable appearance of the thoracic spine stimulator device.  Stable advanced  degenerative changes of the left glenohumeral joint.  IMPRESSION: Low volumes with bibasilar atelectasis.  Original Report Authenticated By: Aletta Edouard, M.D.     Review of Systems  Constitutional: Positive for fever, chills and malaise/fatigue.  HENT: Negative.   Eyes: Negative.   Respiratory: Negative.   Cardiovascular: Negative.   Neurological:       Mental status change over the last 24 hours.   Blood pressure 162/74, pulse 85, temperature 101.8 F (38.8 C), temperature source Rectal, resp. rate 20, SpO2 94.00%. Physical Exam  Constitutional: She is oriented to person, place, and time. She appears well-developed and well-nourished. She appears lethargic.  HENT:  Head: Normocephalic and atraumatic.  Eyes: Conjunctivae and EOM are normal. Pupils are equal, round, and reactive to light.  Cardiovascular: Normal rate and regular rhythm.   Respiratory: Effort normal and breath sounds normal.  GI: Bowel sounds are normal. She exhibits mass (RUQ). There is tenderness in the right upper quadrant. There is positive Murphy's sign.  Musculoskeletal: Normal range of motion.  Neurological: She is oriented to person, place, and time. She appears lethargic. GCS eye subscore is 4. GCS verbal subscore is 5.  Skin: Skin is warm and dry.  Psychiatric: She has a normal mood and affect. Her behavior is normal. Judgment and thought content normal.    Assessment/Plan: Acute cholecystitis with early sepsis WBC 34.5K Patient on ASA and anti-platelet medication (Brilinta)  Would benefit from percutaneous cholecystostomy tube with delayed lap cholecystectomy IR has been contacted and will mobilize team for procedure.  [name] [name] 12/20/2012, 9:08 PM

## 2012-12-20 NOTE — Progress Notes (Signed)
ANTIBIOTIC CONSULT NOTE - INITIAL  Pharmacy Consult for vancomycin Indication: Acute cholesystitis with early sepsis  No Known Allergies  Patient Measurements: Height: _0  (157.5 cm) Weight: 201 lb 8 oz (91.4 kg) (Per 11/05/12 documentation) IBW/kg (Calculated) : 50.1  Vital Signs: Temp: 99.5 F (37.5 C) (04/05 2226) Temp src: Rectal (04/05 2226) BP: 166/75 mmHg (04/05 2200) Pulse Rate: 85 (04/05 2200) Intake/Output from previous day:   Intake/Output from this shift: Total I/O In: -  Out: 1900 [Urine:1900]  Labs:  Recent Labs  12/20/12 1706  WBC 34.5*  HGB 18.2*  PLT 42*  CREATININE 0.89   Estimated Creatinine Clearance: 53.9 ml/min (by C-G formula based on Cr of 0.89). No results found for this basename: VANCOTROUGH, VANCOPEAK, VANCORANDOM, GENTTROUGH, GENTPEAK, GENTRANDOM, TOBRATROUGH, TOBRAPEAK, TOBRARND, AMIKACINPEAK, AMIKACINTROU, AMIKACIN,  in the last 72 hours   Microbiology: No results found for this or any previous visit (from the past 720 hour(s)).  Medical History: Past Medical History  Diagnosis Date  . HTN (hypertension)   . DJD (degenerative joint disease)   . Osteopenia   . Dyslipidemia   . Venous ulcer   . Thrombocytopenia   . CAD (coronary artery disease)     a. NSTEMI 2/14 => LHC 10/20/12:  dLM 20%, pLAD 99%, mLAD 60% followed by 99%, oD1 99%, and pD2 30%, mild plaque disease in the CFX and RCA. PCI: Xience Xpedition DES to the proximal and mid LAD  . Hx of echocardiogram     a. Echo 10/18/12: mod LVH, EF 60-65%, Gr 1 diast dysfn, mild AI, mild LAE.    Medications:  Scheduled:  . sodium chloride  500 mL Intravenous Once  . [COMPLETED] acetaminophen  650 mg Rectal Once  . acetaminophen  650 mg Oral Once  . diphenhydrAMINE  25 mg Oral Once  . [START ON 12/21/2012] metoprolol  2.5 mg Intravenous Q6H  . [START ON 12/21/2012] pantoprazole (PROTONIX) IV  40 mg Intravenous Daily  . [COMPLETED] piperacillin-tazobactam (ZOSYN)  IV  3.375 g Intravenous  Once  . piperacillin-tazobactam (ZOSYN)  IV  3.375 g Intravenous Q8H  . [COMPLETED] potassium chloride  10 mEq Intravenous Once  . [COMPLETED] potassium chloride  10 mEq Intravenous Once  . [COMPLETED] potassium chloride  10 mEq Intravenous Once  . potassium chloride  10 mEq Intravenous Q1 Hr x 4  . [COMPLETED] sodium chloride  1,000 mL Intravenous Once  . [COMPLETED] sodium chloride  1,000 mL Intravenous Once  . sodium chloride  3 mL Intravenous Q12H  . [COMPLETED] vancomycin  1,000 mg Intravenous Once   Assessment: 79 yo female admitted with acute cholecystitis and early sepsis. Pharmacy to manage vancomycin. Patient is also to receive Zosyn. Patient already received Zosyn 3.375gm IV x 1 and vancomycin 1gm IV x 1.   Goal of Therapy:  Vancomycin trough level 10-15 mcg/ml  Plan:  1. Vancomycin 536m IV x 1 (in addition to 1gm vancomycin patient already received), then 12580mIV Q24H.   ReOtila Back/01/2013,11:11 PM

## 2012-12-21 LAB — TYPE AND SCREEN: ABO/RH(D): A POS

## 2012-12-21 LAB — COMPREHENSIVE METABOLIC PANEL
ALT: 220 U/L — ABNORMAL HIGH (ref 0–35)
Albumin: 2.7 g/dL — ABNORMAL LOW (ref 3.5–5.2)
Alkaline Phosphatase: 274 U/L — ABNORMAL HIGH (ref 39–117)
Calcium: 8.9 mg/dL (ref 8.4–10.5)
GFR calc Af Amer: 58 mL/min — ABNORMAL LOW (ref >90)
Glucose, Bld: 152 mg/dL — ABNORMAL HIGH (ref 70–99)
Potassium: 2.6 mEq/L — CL (ref 3.5–5.1)
Sodium: 142 mEq/L (ref 135–145)
Total Protein: 6.2 g/dL (ref 6.0–8.3)

## 2012-12-21 LAB — HEMOGLOBIN A1C
Hgb A1c MFr Bld: 5.8 % — ABNORMAL HIGH (ref <5.7)
Mean Plasma Glucose: 120 mg/dL — ABNORMAL HIGH (ref <117)

## 2012-12-21 LAB — BASIC METABOLIC PANEL
CO2: 30 mEq/L (ref 19–32)
Chloride: 101 mEq/L (ref 96–112)
Creatinine, Ser: 0.92 mg/dL (ref 0.50–1.10)
Potassium: 3.2 mEq/L — ABNORMAL LOW (ref 3.5–5.1)
Sodium: 140 mEq/L (ref 135–145)

## 2012-12-21 LAB — MAGNESIUM: Magnesium: 1.5 mg/dL (ref 1.5–2.5)

## 2012-12-21 LAB — CBC
HCT: 42.5 % (ref 36.0–46.0)
Hemoglobin: 15.2 g/dL — ABNORMAL HIGH (ref 12.0–15.0)
MCHC: 35.8 g/dL (ref 30.0–36.0)
MCV: 89.1 fL (ref 78.0–100.0)
WBC: 22.3 10*3/uL — ABNORMAL HIGH (ref 4.0–10.5)

## 2012-12-21 LAB — PHOSPHORUS: Phosphorus: 3 mg/dL (ref 2.3–4.6)

## 2012-12-21 MED ORDER — METOPROLOL TARTRATE 1 MG/ML IV SOLN
10.0000 mg | Freq: Four times a day (QID) | INTRAVENOUS | Status: DC
  Administered 2012-12-21 – 2012-12-22 (×4): 10 mg via INTRAVENOUS
  Filled 2012-12-21 (×6): qty 10

## 2012-12-21 MED ORDER — FENTANYL CITRATE 0.05 MG/ML IJ SOLN
INTRAMUSCULAR | Status: AC
  Filled 2012-12-21: qty 2

## 2012-12-21 MED ORDER — METOPROLOL TARTRATE 1 MG/ML IV SOLN
INTRAVENOUS | Status: AC
  Filled 2012-12-21: qty 5

## 2012-12-21 MED ORDER — FENTANYL CITRATE 0.05 MG/ML IJ SOLN
INTRAMUSCULAR | Status: AC | PRN
  Administered 2012-12-21 (×3): 25 ug via INTRAVENOUS

## 2012-12-21 MED ORDER — POTASSIUM CHLORIDE 10 MEQ/100ML IV SOLN
10.0000 meq | INTRAVENOUS | Status: AC
  Administered 2012-12-21 (×4): 10 meq via INTRAVENOUS
  Filled 2012-12-21: qty 300
  Filled 2012-12-21: qty 100

## 2012-12-21 MED ORDER — METOPROLOL TARTRATE 1 MG/ML IV SOLN
5.0000 mg | Freq: Once | INTRAVENOUS | Status: AC
  Administered 2012-12-21: 5 mg via INTRAVENOUS
  Filled 2012-12-21: qty 5

## 2012-12-21 MED ORDER — MORPHINE SULFATE 2 MG/ML IJ SOLN
2.0000 mg | INTRAMUSCULAR | Status: DC | PRN
  Administered 2012-12-21 (×3): 4 mg via INTRAVENOUS
  Administered 2012-12-21: 2 mg via INTRAVENOUS
  Administered 2012-12-21: 4 mg via INTRAVENOUS
  Administered 2012-12-21: 2 mg via INTRAVENOUS
  Administered 2012-12-22 (×4): 4 mg via INTRAVENOUS
  Filled 2012-12-21: qty 2
  Filled 2012-12-21: qty 1
  Filled 2012-12-21 (×3): qty 2
  Filled 2012-12-21: qty 1
  Filled 2012-12-21 (×4): qty 2

## 2012-12-21 MED ORDER — METOPROLOL TARTRATE 1 MG/ML IV SOLN
5.0000 mg | Freq: Four times a day (QID) | INTRAVENOUS | Status: DC
  Administered 2012-12-21: 5 mg via INTRAVENOUS
  Filled 2012-12-21: qty 5

## 2012-12-21 MED ORDER — IOHEXOL 300 MG/ML  SOLN
50.0000 mL | Freq: Once | INTRAMUSCULAR | Status: AC | PRN
  Administered 2012-12-21: 10 mL via INTRAVENOUS

## 2012-12-21 MED ORDER — MIDAZOLAM HCL 2 MG/2ML IJ SOLN
INTRAMUSCULAR | Status: AC
  Filled 2012-12-21: qty 2

## 2012-12-21 MED ORDER — ACETAMINOPHEN 650 MG RE SUPP
650.0000 mg | Freq: Four times a day (QID) | RECTAL | Status: DC | PRN
  Administered 2012-12-21 (×2): 650 mg via RECTAL
  Filled 2012-12-21 (×2): qty 1

## 2012-12-21 MED ORDER — MIDAZOLAM HCL 2 MG/2ML IJ SOLN
INTRAMUSCULAR | Status: AC | PRN
  Administered 2012-12-21: 1 mg via INTRAVENOUS

## 2012-12-21 MED ORDER — MEPERIDINE HCL 50 MG/ML IJ SOLN
INTRAMUSCULAR | Status: AC
  Filled 2012-12-21: qty 1

## 2012-12-21 MED ORDER — METOPROLOL TARTRATE 1 MG/ML IV SOLN
5.0000 mg | Freq: Once | INTRAVENOUS | Status: AC
  Administered 2012-12-21: 5 mg via INTRAVENOUS

## 2012-12-21 MED ORDER — ACETAMINOPHEN 325 MG PO TABS: 650.0000 mg | ORAL_TABLET | Freq: Four times a day (QID) | ORAL | Status: DC | PRN

## 2012-12-21 MED ORDER — POTASSIUM CHLORIDE IN NACL 20-0.9 MEQ/L-% IV SOLN
INTRAVENOUS | Status: DC
  Administered 2012-12-21 – 2012-12-25 (×6): via INTRAVENOUS
  Filled 2012-12-21 (×10): qty 1000

## 2012-12-21 MED ORDER — VANCOMYCIN HCL IN DEXTROSE 750-5 MG/150ML-% IV SOLN
750.0000 mg | Freq: Two times a day (BID) | INTRAVENOUS | Status: DC
  Administered 2012-12-21 – 2012-12-23 (×5): 750 mg via INTRAVENOUS
  Filled 2012-12-21 (×5): qty 150

## 2012-12-21 NOTE — Progress Notes (Signed)
[facility] Bank called and said that the 2nd unit of Platelets was needed for critical Patient in surgery. Blood bank said they they would notified us when another unit would be available.

## 2012-12-21 NOTE — Progress Notes (Signed)
Subjective: Arouses, RUQ pain  Objective: Vital signs in last 24 hours: Temp:  [97.3 F (36.3 C)-102.7 F (39.3 C)] 102.7 F (39.3 C) (04/06 0820) Pulse Rate:  [85-129] 129 (04/06 0800) Resp:  [19-36] 21 (04/06 0800) BP: (116-199)/(58-102) 158/70 mmHg (04/06 0800) SpO2:  [90 %-96 %] 90 % (04/06 0800) Weight:  [89.6 kg (197 lb 8.5 oz)-91.4 kg (201 lb 8 oz)] 89.6 kg (197 lb 8.5 oz) (04/05 2317)    Intake/Output from previous day: 04/05 0701 - 04/06 0700 In: 2576.2 [I.V.:2026.2; IV Piggyback:550] Out: 2500 [Urine:2500] Intake/Output this shift: Total I/O In: 10 [I.V.:10] Out: 225 [Urine:225]  General appearance: fatigued and no distress Resp: clear to auscultation bilaterally Cardio: regular rate and rhythm GI: soft, tender RUQ, no generalized tenderness, few BS  Lab Results:   Recent Labs  12/20/12 1706  WBC 34.5*  HGB 18.2*  HCT 49.4*  PLT 42*   BMET  Recent Labs  12/20/12 1706  NA 138  K 2.5*  CL 95*  CO2 28  GLUCOSE 223*  BUN 15  CREATININE 0.89  CALCIUM 9.5   PT/INR No results found for this basename: LABPROT, INR,  in the last 72 hours ABG  Recent Labs  12/20/12 2111  PHART 7.440  HCO3 30.8*    Studies/Results: Ct Abdomen Pelvis W Contrast  12/20/2012  *RADIOLOGY REPORT*  Clinical Data: Abdominal pain.  Vomiting.  History of hypertension.  CT ABDOMEN AND PELVIS WITH CONTRAST  Technique:  Multidetector CT imaging of the abdomen and pelvis was performed following the standard protocol during bolus administration of intravenous contrast.  Contrast: 153m OMNIPAQUE IOHEXOL 300 MG/ML  SOLN CT of the chest 10/17/2012  Comparison: CT of the chest 10/17/2012  Findings: Lung bases show posterior consolidations bilaterally, associated with air bronchograms and small effusions.  The heart is mildly enlarged.  Coronary stent is identified.  There is significant artifact from spinal stimulator.  There is a small amount of ascites surrounding the liver.  Periportal edema is noted. There is marked enlargement of the gallbladder.  Gallbladder measures greater than 12.0 x 5.4 cm. Dependent layering of high attenuation material within the gallbladder is consistent with small stones or layering sludge. Within the region of the gallbladder neck, there is a high attenuation structure which measures 1.7 cm.  This is less apparent on the delayed images.  It is not clear whether this represents a gallstone or lymph node in the porta hepatis.  This is best seen on image number 28.  There is pericholecystic fluid or gallbladder wall thickening.  No focal abnormality identified within the spleen.  There is a left renal cysts.  There are patchy areas of renal parenchymal thinning bilaterally, left greater than right.  No focal abnormality identified within the pancreas.  The stomach and small bowel loops are normal in appearance. Colonic loops are normal in caliber and wall thickness.  There is moderate stool within the rectosigmoid colon. Small umbilical hernia contains only mesenteric fat.  The urinary bladder is markedly distended.  The uterus is present. No evidence for adnexal mass or free pelvic fluid. No evidence for aortic aneurysm.  Degenerative changes are seen throughout the lower thoracic and lumbar spine.  Patient has had multilevel posterior fusion, creating significant artifact. Prior right hip  IMPRESSION:  1.  Bilateral lower lobe infiltrates and small effusions. 2.  Coronary stent. 3.  Small amount of ascites.  4.  Distended gallbladder with pericholecystic fluid or gallbladder wall thickening. 5.  Suspect a  layering gallstones or debris/sludge. 6.  Marked distension of the urinary bladder. 7.  Small umbilical hernia, containing mesenteric fat.  The findings were discussed with Jarrett Soho Muthersbaugh on 12/20/2012 at 7:30 p.m.   Original Report Authenticated By: Nolon Nations, M.D.    Dg Chest Portable 1 View  12/20/2012  *RADIOLOGY REPORT*  Clinical Data:  Tachycardia.  PORTABLE CHEST - 1 VIEW  Comparison: 10/17/2012  Findings: Lung volumes are extremely low with bibasilar atelectasis present.  No overt edema or consolidation.  No pleural fluid identified.  Stable appearance of the thoracic spine stimulator device.  Stable advanced degenerative changes of the left glenohumeral joint.  IMPRESSION: Low volumes with bibasilar atelectasis.   Original Report Authenticated By: Aletta Edouard, M.D.     Anti-infectives: Anti-infectives   Start     Dose/Rate Route Frequency Ordered Stop   12/21/12 1200  vancomycin (VANCOCIN) 1,250 mg in sodium chloride 0.9 % 250 mL IVPB  Status:  Discontinued     1,250 mg 166.7 mL/hr over 90 Minutes Intravenous Every 24 hours 12/20/12 2322 12/21/12 0841   12/21/12 1200  vancomycin (VANCOCIN) IVPB 750 mg/150 ml premix     750 mg 150 mL/hr over 60 Minutes Intravenous Every 12 hours 12/21/12 0841     12/20/12 2359  piperacillin-tazobactam (ZOSYN) IVPB 3.375 g     3.375 g 12.5 mL/hr over 240 Minutes Intravenous Every 8 hours 12/20/12 2307     12/20/12 2359  vancomycin (VANCOCIN) 500 mg in sodium chloride 0.9 % 100 mL IVPB     500 mg 100 mL/hr over 60 Minutes Intravenous  Once 12/20/12 2322 12/21/12 0402   12/20/12 1800  vancomycin (VANCOCIN) IVPB 1000 mg/200 mL premix     1,000 mg 200 mL/hr over 60 Minutes Intravenous  Once 12/20/12 1746 12/20/12 2035   12/20/12 1800  piperacillin-tazobactam (ZOSYN) IVPB 3.375 g     3.375 g 12.5 mL/hr over 240 Minutes Intravenous  Once 12/20/12 1746 12/20/12 1841      Assessment/Plan: Acute cholecystitis - IR to do perc drain this AM, zosyn, bowelrest Thrombocytopenia/antiplatelet therapy - finishing second unit of platelet transfusion prior to procedure We will follow  LOS: 1 day    Savino Whisenant E 12/21/2012

## 2012-12-21 NOTE — Progress Notes (Signed)
[facility] Progress Note [place] TEAM 1 - Stepdown/ICU TEAM   [name] [name] RFX:[id number] DOB: 1930-09-19 DOA: 12/20/2012 PCP: Mathews Argyle, MD  Brief narrative: 79 y.o. Female w/ a history of HTN; DJD s/p lumbar disk surgery 2005; Osteopenia; Dyslipidemia; Thrombocytopenia; and CAD who presented with confusion and abdominal pain x24hrs. She lives at the [facility] and is wheelchair-bound secondary to severe back pain. She was brought in to emergency department where she was found to have cholecystitis and likely sepsis with fever and tachycardia. Dr. Hulen Skains with general surgery was called but felt that she was not an operative candidate. Instead interventional radiology was consulted to place a percutaneous biliary drain.  In the emergency department patient received vancomycin and Zosyn, and it was noted that she was thrombocytopenic (is chronic) and it was decided that she would need a platelet transfusion prior to a procedure. Patient has history of coronary disease with DES stenting to her LAD 10/20/2012. She is currently on Brillinta.  Per cardiology in emergent situation Pattricia Boss will have to be held until it is deemed safe to restart it by IR after percutaneous drain has been placed.   Assessment/Plan:  Cholecystitis, acute As per Gen Surg - to have percutaneous drainage this morning as emergent procedure  Severe Sepsis WBC 34k & Temp 101.8 meet criteria for sepsis w/ known source of infection - BP currently stable, but altered mentation/end-organ failure therefore meets criteria for severe sepsis - not yet in septic shock   Tachycardia/SVT Likely combination of physiologic tachy (fever, sepsis) + BB withdrawal - increase IV BB - hydrate - tx fever - follow on tele   Chronic Idiopathic Thrombocytopenia plt 42k at presentation - baseline appears to be ~40-60k   Severe hypokalemia Replete via IV - check Mg  HTN  Cont IV BB - follow BP trend   Chronic back  pain On chronic narcotic tx   CAD s/p DES to LAD x2 Feb 2014 NSTEMI 2/14 => LHC 10/20/12: dLM 20%, pLAD 99%, mLAD 60% followed by 99%, oD1 99%, and pD2 30%, mild plaque disease in the CFX and RCA. PCI: Xience Xpedition DES to the proximal and mid LAD  Code Status: FULL Family Communication: No family present at time of visit Disposition Plan: SDU  Consultants: Gen Surg IR [place] Cards  Procedures: none  Antibiotics: Zosyn 4/5 >> Vanc 4/5 >>  DVT prophylaxis: SCDs   HPI/Subjective: The patient will awaken and answer some questions.  She is somnolent.  She complains of unrelenting nausea.  She denies chest pain or shortness of breath.  She complains of abdominal pain in the right upper quadrant.  Objective: Blood pressure 125/65, pulse 110, temperature 102.7 F (39.3 C), temperature source Oral, resp. rate 21, height _0  (1.575 m), weight 89.6 kg (197 lb 8.5 oz), SpO2 90.00%.  Intake/Output Summary (Last 24 hours) at 12/21/12 0933 Last data filed at 12/21/12 0800  Gross per 24 hour  Intake 2586.17 ml  Output   2725 ml  Net -138.83 ml   Exam: General: No acute respiratory distress at rest Lungs: Decreased breath sounds in bilateral bases but no focal crackles with no wheeze and good air movement throughout other fields Cardiovascular: Tachycardic at approximately 100 beats per minute but regular without gallop or rub or appreciable murmur Abdomen: Overweight, nondistended, tender to palpation from epigastrium to right upper quadrant, no rebound, soft, bowel sounds hypoactive but present Extremities: No significant cyanosis, clubbing, or edema bilateral lower extremities  Data Reviewed: Basic  Metabolic Panel:  Recent Labs Lab 12/20/12 1706  NA 138  K 2.5*  CL 95*  CO2 28  GLUCOSE 223*  BUN 15  CREATININE 0.89  CALCIUM 9.5   Liver Function Tests:  Recent Labs Lab 12/20/12 1706  AST 23  ALT 15  ALKPHOS 110  BILITOT 1.1  PROT 7.2  ALBUMIN 3.3*     Recent Labs Lab 12/20/12 1706  LIPASE 12   CBC:  Recent Labs Lab 12/20/12 1706  WBC 34.5*  NEUTROABS 32.1*  HGB 18.2*  HCT 49.4*  MCV 88.7  PLT 42*    Recent Results (from the past 240 hour(s))  MRSA PCR SCREENING     Status: None   Collection Time    12/20/12 11:19 PM      Result Value Range Status   MRSA by PCR NEGATIVE  NEGATIVE Final   Comment:            The GeneXpert MRSA Assay (FDA     approved for NASAL specimens     only), is one component of a     comprehensive MRSA colonization     surveillance program. It is not     intended to diagnose MRSA     infection nor to guide or     monitor treatment for     MRSA infections.     Studies:  Recent x-ray studies have been reviewed in detail by the Attending Physician  Scheduled Meds:  Scheduled Meds: . diphenhydrAMINE  25 mg Oral Once  . fentaNYL      . meperidine      . metoprolol  5 mg Intravenous Q6H  . midazolam      . pantoprazole (PROTONIX) IV  40 mg Intravenous Daily  . piperacillin-tazobactam (ZOSYN)  IV  3.375 g Intravenous Q8H  . sodium chloride  3 mL Intravenous Q12H  . vancomycin  750 mg Intravenous Q12H   Continuous Infusions:    Time spent on care of this patient: 1mns   Rennee Coyne T  [facility] Office  [phone] - Text Page per AShea Evansas per below:  On-Call/Text Page:      [email or web address]      password [email or web address]  If 7PM-7AM, please contact night-coverage [email or web address] Password [email or web address]4/02/2013, 9:33 AM   LOS: 1 day

## 2012-12-21 NOTE — Progress Notes (Signed)
Notified MD that 2nd unit of platelets was hung.

## 2012-12-21 NOTE — Progress Notes (Signed)
2nd unit of platelets hung will notify Md in reference to IR procedure.

## 2012-12-21 NOTE — Progress Notes (Signed)
ANTIBIOTIC CONSULT NOTE - FOLLOW UP  Pharmacy Consult for vancomycin Indication: Acute cholesystitis with early sepsis   No Known Allergies  Patient Measurements: Height: _0  (157.5 cm) Weight: 197 lb 8.5 oz (89.6 kg) IBW/kg (Calculated) : 50.1  Vital Signs: Temp: 102.7 F (39.3 C) (04/06 0820) Temp src: Oral (04/06 0820) BP: 158/70 mmHg (04/06 0800) Pulse Rate: 129 (04/06 0800) Intake/Output from previous day: 04/05 0701 - 04/06 0700 In: 2576.2 [I.V.:2026.2; IV Piggyback:550] Out: 2500 [Urine:2500] Intake/Output from this shift: Total I/O In: 10 [I.V.:10] Out: 225 [Urine:225]  Labs:  Recent Labs  12/20/12 1706  WBC 34.5*  HGB 18.2*  PLT 42*  CREATININE 0.89   Estimated Creatinine Clearance: 53.3 ml/min (by C-G formula based on Cr of 0.89). No results found for this basename: VANCOTROUGH, VANCOPEAK, VANCORANDOM, GENTTROUGH, GENTPEAK, GENTRANDOM, TOBRATROUGH, TOBRAPEAK, TOBRARND, AMIKACINPEAK, AMIKACINTROU, AMIKACIN,  in the last 72 hours    Assessment: 79 yo female with acute cholecystitis and likely sepsis on vancomycin and zosyn.    Goal of Therapy:  Vancomycin trough level 15-20 mcg/ml  Plan:  -Change vancomycin to 772m IV q12hr for goal trough 15-20 -Will follow cultures and progress  AHildred Laser Pharm D 12/21/2012 8:38 AM

## 2012-12-21 NOTE — Progress Notes (Signed)
Patient ID: [name] [name], female   DOB: 16-Dec-1930, 79 y.o.   MRN: [id number] Request received for placement of a percutaneous cholecystostomy tube on pt with fever,leukocytosis,  RUQ abdominal pain, and findings of cholecystitis on recent imaging. Recent films reviewed by Dr. Vernard Gambles. Additional PMH as below. Exam: pt lethargic but arousable; chest- sl dim BS bases; heart- tachy but regular; abd- soft,+BS, tender RUQ; ext- no edema.   Filed Vitals:   12/21/12 0700 12/21/12 0715 12/21/12 0800 12/21/12 0820  BP: 122/61 134/65 158/70   Pulse: 112 115 129   Temp:    102.7 F (39.3 C)  TempSrc:    Oral  Resp: _0 Height:      Weight:      SpO2:   90%    Past Medical History  Diagnosis Date  . HTN (hypertension)   . DJD (degenerative joint disease)   . Osteopenia   . Dyslipidemia   . Venous ulcer   . Thrombocytopenia   . CAD (coronary artery disease)     a. NSTEMI 2/14 => LHC 10/20/12:  dLM 20%, pLAD 99%, mLAD 60% followed by 99%, oD1 99%, and pD2 30%, mild plaque disease in the CFX and RCA. PCI: Xience Xpedition DES to the proximal and mid LAD  . Hx of echocardiogram     a. Echo 10/18/12: mod LVH, EF 60-65%, Gr 1 diast dysfn, mild AI, mild LAE.   Past Surgical History  Procedure Laterality Date  . Back surgery      Lumbar disc  . Hip surgery      R THR  . Eye surgery      Blepharoplasty  . Insertion / placement / revision neurostimulator     Ct Abdomen Pelvis W Contrast  12/20/2012  *RADIOLOGY REPORT*  Clinical Data: Abdominal pain.  Vomiting.  History of hypertension.  CT ABDOMEN AND PELVIS WITH CONTRAST  Technique:  Multidetector CT imaging of the abdomen and pelvis was performed following the standard protocol during bolus administration of intravenous contrast.  Contrast: 195m OMNIPAQUE IOHEXOL 300 MG/ML  SOLN CT of the chest 10/17/2012  Comparison: CT of the chest 10/17/2012  Findings: Lung bases show posterior consolidations bilaterally, associated with air bronchograms  and small effusions.  The heart is mildly enlarged.  Coronary stent is identified.  There is significant artifact from spinal stimulator.  There is a small amount of ascites surrounding the liver. Periportal edema is noted. There is marked enlargement of the gallbladder.  Gallbladder measures greater than 12.0 x 5.4 cm. Dependent layering of high attenuation material within the gallbladder is consistent with small stones or layering sludge. Within the region of the gallbladder neck, there is a high attenuation structure which measures 1.7 cm.  This is less apparent on the delayed images.  It is not clear whether this represents a gallstone or lymph node in the porta hepatis.  This is best seen on image number 28.  There is pericholecystic fluid or gallbladder wall thickening.  No focal abnormality identified within the spleen.  There is a left renal cysts.  There are patchy areas of renal parenchymal thinning bilaterally, left greater than right.  No focal abnormality identified within the pancreas.  The stomach and small bowel loops are normal in appearance. Colonic loops are normal in caliber and wall thickness.  There is moderate stool within the rectosigmoid colon. Small umbilical hernia contains only mesenteric fat.  The urinary bladder is markedly distended.  The uterus is present.  No evidence for adnexal mass or free pelvic fluid. No evidence for aortic aneurysm.  Degenerative changes are seen throughout the lower thoracic and lumbar spine.  Patient has had multilevel posterior fusion, creating significant artifact. Prior right hip  IMPRESSION:  1.  Bilateral lower lobe infiltrates and small effusions. 2.  Coronary stent. 3.  Small amount of ascites.  4.  Distended gallbladder with pericholecystic fluid or gallbladder wall thickening. 5.  Suspect a layering gallstones or debris/sludge. 6.  Marked distension of the urinary bladder. 7.  Small umbilical hernia, containing mesenteric fat.  The findings were  discussed with Jarrett Soho Muthersbaugh on 12/20/2012 at 7:30 p.m.   Original Report Authenticated By: Nolon Nations, M.D.    Dg Chest Portable 1 View  12/20/2012  *RADIOLOGY REPORT*  Clinical Data: Tachycardia.  PORTABLE CHEST - 1 VIEW  Comparison: 10/17/2012  Findings: Lung volumes are extremely low with bibasilar atelectasis present.  No overt edema or consolidation.  No pleural fluid identified.  Stable appearance of the thoracic spine stimulator device.  Stable advanced degenerative changes of the left glenohumeral joint.  IMPRESSION: Low volumes with bibasilar atelectasis.   Original Report Authenticated By: Aletta Edouard, M.D.   Results for orders placed during the hospital encounter of 12/20/12  MRSA PCR SCREENING      Result Value Range   MRSA by PCR NEGATIVE  NEGATIVE  CBC WITH DIFFERENTIAL      Result Value Range   WBC 34.5 (*) 4.0 - 10.5 K/uL   RBC 5.57 (*) 3.87 - 5.11 MIL/uL   Hemoglobin 18.2 (*) 12.0 - 15.0 g/dL   HCT 49.4 (*) 36.0 - 46.0 %   MCV 88.7  78.0 - 100.0 fL   MCH 32.7  26.0 - 34.0 pg   MCHC 36.8 (*) 30.0 - 36.0 g/dL   RDW 13.7  11.5 - 15.5 %   Platelets 42 (*) 150 - 400 K/uL   Neutrophils Relative 93 (*) 43 - 77 %   Lymphocytes Relative 3 (*) 12 - 46 %   Monocytes Relative 4  3 - 12 %   Eosinophils Relative 0  0 - 5 %   Basophils Relative 0  0 - 1 %   Neutro Abs 32.1 (*) 1.7 - 7.7 K/uL   Lymphs Abs 1.0  0.7 - 4.0 K/uL   Monocytes Absolute 1.4 (*) 0.1 - 1.0 K/uL   Eosinophils Absolute 0.0  0.0 - 0.7 K/uL   Basophils Absolute 0.0  0.0 - 0.1 K/uL   Smear Review MORPHOLOGY UNREMARKABLE    COMPREHENSIVE METABOLIC PANEL      Result Value Range   Sodium 138  135 - 145 mEq/L   Potassium 2.5 (*) 3.5 - 5.1 mEq/L   Chloride 95 (*) 96 - 112 mEq/L   CO2 28  19 - 32 mEq/L   Glucose, Bld 223 (*) 70 - 99 mg/dL   BUN 15  6 - 23 mg/dL   Creatinine, Ser 0.89  0.50 - 1.10 mg/dL   Calcium 9.5  8.4 - 10.5 mg/dL   Total Protein 7.2  6.0 - 8.3 g/dL   Albumin 3.3 (*) 3.5 - 5.2  g/dL   AST 23  0 - 37 U/L   ALT 15  0 - 35 U/L   Alkaline Phosphatase 110  39 - 117 U/L   Total Bilirubin 1.1  0.3 - 1.2 mg/dL   GFR calc non Af Amer 60 (*) >90 mL/min   GFR calc Af Amer 70 (*) >90  mL/min  PROCALCITONIN      Result Value Range   Procalcitonin 2.85    URINALYSIS, ROUTINE W REFLEX MICROSCOPIC      Result Value Range   Color, Urine YELLOW  YELLOW   APPearance CLEAR  CLEAR   Specific Gravity, Urine 1.016  1.005 - 1.030   pH 6.0  5.0 - 8.0   Glucose, UA NEGATIVE  NEGATIVE mg/dL   Hgb urine dipstick NEGATIVE  NEGATIVE   Bilirubin Urine NEGATIVE  NEGATIVE   Ketones, ur NEGATIVE  NEGATIVE mg/dL   Protein, ur 100 (*) NEGATIVE mg/dL   Urobilinogen, UA 0.2  0.0 - 1.0 mg/dL   Nitrite NEGATIVE  NEGATIVE   Leukocytes, UA NEGATIVE  NEGATIVE  LIPASE, BLOOD      Result Value Range   Lipase 12  11 - 59 U/L  URINE MICROSCOPIC-ADD ON      Result Value Range   Squamous Epithelial / LPF RARE  RARE   WBC, UA 0-2  <3 WBC/hpf   RBC / HPF 0-2  <3 RBC/hpf   Bacteria, UA RARE  RARE   Casts HYALINE CASTS (*) NEGATIVE  CG4 I-STAT (LACTIC ACID)      Result Value Range   Lactic Acid, Venous 4.48 (*) 0.5 - 2.2 mmol/L  POCT I-STAT TROPONIN I      Result Value Range   Troponin i, poc 0.02  0.00 - 0.08 ng/mL   Comment 3           POCT I-STAT 3, BLOOD GAS (G3+)      Result Value Range   pH, Arterial 7.440  7.350 - 7.450   pCO2 arterial 46.1 (*) 35.0 - 45.0 mmHg   pO2, Arterial 92.0  80.0 - 100.0 mmHg   Bicarbonate 30.8 (*) 20.0 - 24.0 mEq/L   TCO2 32  0 - 100 mmol/L   O2 Saturation 97.0     Acid-Base Excess 6.0 (*) 0.0 - 2.0 mmol/L   Patient temperature 101.8 F     Collection site RADIAL, ALLEN'S TEST ACCEPTABLE     Drawn by RT     Sample type ARTERIAL    CG4 I-STAT (LACTIC ACID)      Result Value Range   Lactic Acid, Venous 2.07  0.5 - 2.2 mmol/L  TYPE AND SCREEN      Result Value Range   ABO/RH(D) A POS     Antibody Screen NEG     Sample Expiration 12/23/2012    PREPARE  PLATELET PHERESIS      Result Value Range   Unit Number [id number]     Blood Component Type PLTPHER LR1     Unit division 00     Status of Unit ISSUED     Transfusion Status OK TO TRANSFUSE     Unit Number [id number]     Blood Component Type PLTPHER LR2     Unit division 00     Status of Unit REL FROM [facility]     Transfusion Status OK TO TRANSFUSE     Unit Number [id number]     Blood Component Type PLTPHER LR1     Unit division 00     Status of Unit ISSUED     Transfusion Status OK TO TRANSFUSE    A/P: Pt with hx fever, leukocytosis, RUQ abdominal pain, thrombocytopenia and findings of cholecystitis on recent imaging. She is poor surgical candidate and plan is now for percutaneous cholecystostomy. Pt has had 2 units platelets today and Brilinta held,  potassium replaced. Unable to reach pt's son, Zenia Resides, by phone this am. Emergent authorization to proceed given by Dr. Malen Gauze (pt's primary care physician).

## 2012-12-21 NOTE — Progress Notes (Signed)
CRITICAL VALUE ALERT  Critical value received:  K 2.6  Date of notification:  12/21/2012  Time of notification:  1211  Critical value read back:yes  Nurse who received alert:  Vista Lawman, RN  MD notified (1st page):  Dr. Thereasa Solo  Time of first page:  1216  MD notified (2nd page):  Time of second page:  Responding MD:  Dr. Thereasa Solo  Time MD responded:  1216

## 2012-12-21 NOTE — Progress Notes (Signed)
Utilization Review Completed.   Mashayla Lavin, RN, BSN Nurse Case Manager  [phone]  

## 2012-12-21 NOTE — ED Provider Notes (Signed)
Medical screening examination/treatment/procedure(s) were conducted as a shared visit with non-physician practitioner(s) and myself.  I personally evaluated the patient during the encounter  [name] [name] is a 79 y.o. female hx of HTN, CAD, NSTEMI here with ab pain, fever, AMS. Patient slow to respond. Febrile and tachycardic in the ED. Hypertensive in the ED. + RUQ and RLQ tenderness. WBC 34, lactate 4.8. CXR unremarkable. CT showed gallstones with cholecystitis. Concerned for gangrenous cholecystitis. Surgery called and will call IR for drain. Critical called and medicine will admit to step down. Patient given vanc/zosyn for broad spectrum coverage.   CRITICAL CARE Performed by: Darl Householder, Robbye Dede   Total critical care time: 1 hr   Critical care time was exclusive of separately billable procedures and treating other patients.  Critical care was necessary to treat or prevent imminent or life-threatening deterioration.  Critical care was time spent personally by me on the following activities: development of treatment plan with patient and/or surrogate as well as nursing, discussions with consultants, evaluation of patient's response to treatment, examination of patient, obtaining history from patient or surrogate, ordering and performing treatments and interventions, ordering and review of laboratory studies, ordering and review of radiographic studies, pulse oximetry and re-evaluation of patient's condition.    Wandra Arthurs, MD 12/21/12 [phone]

## 2012-12-21 NOTE — Progress Notes (Signed)
Pt's temp 102.7.  HR 120s with runs of SVT with a HR of 150s.  BP 128/65.  Dr. Thereasa Solo notified and at bedside.  Tylenol supp given, one time dose 49m IV lopressor given.  HR now 90s-100s.  Will continue to monitor.  SVista Lawman RN

## 2012-12-21 NOTE — Progress Notes (Signed)
Received a call from Blood bank that the Platelets were ready.

## 2012-12-22 ENCOUNTER — Inpatient Hospital Stay (HOSPITAL_COMMUNITY): Payer: Medicare Other

## 2012-12-22 ENCOUNTER — Other Ambulatory Visit: Payer: Medicare Other | Admitting: Lab

## 2012-12-22 ENCOUNTER — Ambulatory Visit: Payer: Medicare Other | Admitting: Hematology & Oncology

## 2012-12-22 LAB — PREPARE PLATELET PHERESIS: Unit division: 0

## 2012-12-22 LAB — COMPREHENSIVE METABOLIC PANEL
ALT: 125 U/L — ABNORMAL HIGH (ref 0–35)
Alkaline Phosphatase: 241 U/L — ABNORMAL HIGH (ref 39–117)
CO2: 29 mEq/L (ref 19–32)
Chloride: 106 mEq/L (ref 96–112)
GFR calc Af Amer: 76 mL/min — ABNORMAL LOW (ref >90)
GFR calc non Af Amer: 65 mL/min — ABNORMAL LOW (ref >90)
Glucose, Bld: 153 mg/dL — ABNORMAL HIGH (ref 70–99)
Potassium: 3 mEq/L — ABNORMAL LOW (ref 3.5–5.1)
Sodium: 144 mEq/L (ref 135–145)
Total Bilirubin: 5.9 mg/dL — ABNORMAL HIGH (ref 0.3–1.2)

## 2012-12-22 LAB — CBC
HCT: 41.8 % (ref 36.0–46.0)
Hemoglobin: 14.9 g/dL (ref 12.0–15.0)
MCHC: 35 g/dL (ref 30.0–36.0)
Platelets: 18 10*3/uL — CL (ref 150–400)
RBC: 4.61 MIL/uL (ref 3.87–5.11)
RBC: 4.75 MIL/uL (ref 3.87–5.11)
RDW: 14.2 % (ref 11.5–15.5)
RDW: 14.4 % (ref 11.5–15.5)
WBC: 15.8 10*3/uL — ABNORMAL HIGH (ref 4.0–10.5)
WBC: 15.1 10*3/uL — ABNORMAL HIGH (ref 4.0–10.5)

## 2012-12-22 LAB — URINE CULTURE

## 2012-12-22 MED ORDER — POTASSIUM CHLORIDE 10 MEQ/100ML IV SOLN
10.0000 meq | INTRAVENOUS | Status: AC
  Administered 2012-12-22 (×4): 10 meq via INTRAVENOUS
  Filled 2012-12-22: qty 400

## 2012-12-22 MED ORDER — TICAGRELOR 90 MG PO TABS: 90.0000 mg | ORAL_TABLET | Freq: Two times a day (BID) | ORAL | Status: DC

## 2012-12-22 MED ORDER — MORPHINE SULFATE 4 MG/ML IJ SOLN
4.0000 mg | INTRAMUSCULAR | Status: DC | PRN
  Filled 2012-12-22: qty 1

## 2012-12-22 MED ORDER — CLONIDINE HCL 0.1 MG/24HR TD PTWK
0.1000 mg | MEDICATED_PATCH | TRANSDERMAL | Status: DC
Start: 2012-12-22 — End: 2012-12-24
  Administered 2012-12-22: 0.1 mg via TRANSDERMAL
  Filled 2012-12-22: qty 1

## 2012-12-22 MED ORDER — MORPHINE SULFATE 2 MG/ML IJ SOLN: 2.0000 mg | INTRAMUSCULAR | Status: DC | PRN

## 2012-12-22 MED ORDER — MAGNESIUM SULFATE 40 MG/ML IJ SOLN
2.0000 g | Freq: Once | INTRAMUSCULAR | Status: AC
  Administered 2012-12-22: 2 g via INTRAVENOUS
  Filled 2012-12-22: qty 50

## 2012-12-22 MED ORDER — TICAGRELOR 90 MG PO TABS
90.0000 mg | ORAL_TABLET | Freq: Two times a day (BID) | ORAL | Status: DC
  Administered 2012-12-22 – 2012-12-26 (×9): 90 mg via ORAL
  Filled 2012-12-22 (×10): qty 1

## 2012-12-22 MED ORDER — IOHEXOL 300 MG/ML  SOLN
50.0000 mL | Freq: Once | INTRAMUSCULAR | Status: AC | PRN
  Administered 2012-12-22: 20 mL

## 2012-12-22 MED ORDER — HYDRALAZINE HCL 20 MG/ML IJ SOLN
10.0000 mg | Freq: Four times a day (QID) | INTRAMUSCULAR | Status: AC
  Administered 2012-12-22 – 2012-12-25 (×9): 10 mg via INTRAVENOUS
  Filled 2012-12-22 (×2): qty 0.5
  Filled 2012-12-22: qty 1
  Filled 2012-12-22 (×3): qty 0.5
  Filled 2012-12-22: qty 1
  Filled 2012-12-22: qty 0.5
  Filled 2012-12-22: qty 1
  Filled 2012-12-22 (×2): qty 0.5

## 2012-12-22 MED ORDER — METOPROLOL TARTRATE 1 MG/ML IV SOLN
10.0000 mg | INTRAVENOUS | Status: AC
  Administered 2012-12-22 – 2012-12-24 (×14): 10 mg via INTRAVENOUS
  Filled 2012-12-22 (×14): qty 10

## 2012-12-22 MED ORDER — LORAZEPAM 2 MG/ML IJ SOLN
0.5000 mg | Freq: Four times a day (QID) | INTRAMUSCULAR | Status: DC | PRN
  Administered 2012-12-22: 1 mg via INTRAVENOUS
  Filled 2012-12-22: qty 1

## 2012-12-22 MED ORDER — MORPHINE SULFATE 4 MG/ML IJ SOLN
4.0000 mg | INTRAMUSCULAR | Status: DC | PRN
  Administered 2012-12-22 – 2012-12-25 (×19): 4 mg via INTRAVENOUS
  Filled 2012-12-22 (×19): qty 1

## 2012-12-22 MED ORDER — HYDRALAZINE HCL 20 MG/ML IJ SOLN
INTRAMUSCULAR | Status: AC
  Administered 2012-12-22: 10 mg via INTRAVENOUS
  Filled 2012-12-22: qty 1

## 2012-12-22 MED ORDER — MORPHINE SULFATE 10 MG/ML IJ SOLN
INTRAMUSCULAR | Status: AC | PRN
  Administered 2012-12-22: 4 mg via INTRAVENOUS

## 2012-12-22 NOTE — Procedures (Signed)
Cholecystomy tube check demonstrates appropriate positioning within the GB fossa.   There is no opacification of the cystic duct or CBD.

## 2012-12-22 NOTE — Clinical Social Work Psychosocial (Signed)
     Clinical Social Work Department BRIEF PSYCHOSOCIAL ASSESSMENT 12/22/2012  Patient:  [name] [name], [name] [name]     Account Number:  [id number]     Admit date:  12/20/2012  Clinical Social Worker:  Valda Lamb  Date/Time:  12/22/2012 10:04 AM  Referred by:  RN  Date Referred:  12/22/2012 Referred for  SNF Placement   Other Referral:   Interview type:  Patient Other interview type:   CSW also completed assessment with [name] at [facility] who stated pt was admitted from their Independent facility.    PSYCHOSOCIAL DATA Living Status:  FACILITY Admitted from facility:  [place] Level of care:  Independent Living Primary support name:  [name] [name] [phone] Primary support relationship to patient:  CHILD, ADULT Degree of support available:   Pt did not confirm amount of support available at home.    CURRENT CONCERNS Current Concerns  Post-Acute Placement   Other Concerns:    SOCIAL WORK ASSESSMENT / PLAN CSW informed pt admitted from [facility].    CSW spoke with pt who confirmed she was from [facility]. Pt appeared very ill and stated she was in pain. Pt did not feel well enough to answer additional questions. Pt was very brief with answers shaking her head "yes".    CSW contacted [name] with [facility] who informed CSW that pt is actually from [facility]. [name] informed CSW that the facility will actually hold a SNF bed for pt in the instance that she needs a higher level of care at discharge. CSW will recommended PT/OT eval.   Assessment/plan status:  Psychosocial Support/Ongoing Assessment of Needs Other assessment/ plan:   Information/referral to community resources:   No resources needed at this time.    PATIENTS/FAMILYS RESPONSE TO PLAN OF CARE: Pt laying in bed alert and oriented however stating she does not feel well. Pt was very brief during assessment. Pt plans to return to [place] facility at discharge.    PT eval needed to determine whether a higher level of care is needed.

## 2012-12-22 NOTE — Progress Notes (Signed)
Subjective: Feels poorly not better  Objective: Vital signs in last 24 hours: Temp:  [97.8 F (36.6 C)-100.1 F (37.8 C)] 97.8 F (36.6 C) (04/07 0833) Pulse Rate:  [92-142] 103 (04/07 0833) Resp:  [15-37] 36 (04/07 0833) BP: (108-196)/(55-97) 196/82 mmHg (04/07 0833) SpO2:  [89 %-93 %] 91 % (04/07 0811)    Intake/Output from previous day: 04/06 0701 - 04/07 0700 In: 1947.1 [I.V.:1087.1; IV Piggyback:850] Out: 1370 [Urine:1150; Drains:220] Intake/Output this shift: Total I/O In: 15.5 [I.V.:3; Blood:12.5] Out: 250 [Urine:200; Drains:50]  General appearance: fatigued GI: drain with some bloody output, mildly tender ruq  Lab Results:   Recent Labs  12/22/12 0445 12/22/12 0610  WBC 16.6* 15.8*  HGB 14.2 14.6  HCT 40.6 40.7  PLT QUESTIONABLE RESULTS, RECOMMEND RECOLLECT TO VERIFY 18*   BMET  Recent Labs  12/21/12 1818 12/22/12 0445  NA 140 144  K 3.2* 3.0*  CL 101 106  CO2 30 29  GLUCOSE 140* 153*  BUN 18 18  CREATININE 0.92 0.83  CALCIUM 9.0 9.0   PT/INR No results found for this basename: LABPROT, INR,  in the last 72 hours ABG  Recent Labs  12/20/12 2111  PHART 7.440  HCO3 30.8*    Studies/Results: Ct Abdomen Pelvis W Contrast  12/20/2012  *RADIOLOGY REPORT*  Clinical Data: Abdominal pain.  Vomiting.  History of hypertension.  CT ABDOMEN AND PELVIS WITH CONTRAST  Technique:  Multidetector CT imaging of the abdomen and pelvis was performed following the standard protocol during bolus administration of intravenous contrast.  Contrast: 155m OMNIPAQUE IOHEXOL 300 MG/ML  SOLN CT of the chest 10/17/2012  Comparison: CT of the chest 10/17/2012  Findings: Lung bases show posterior consolidations bilaterally, associated with air bronchograms and small effusions.  The heart is mildly enlarged.  Coronary stent is identified.  There is significant artifact from spinal stimulator.  There is a small amount of ascites surrounding the liver. Periportal edema is  noted. There is marked enlargement of the gallbladder.  Gallbladder measures greater than 12.0 x 5.4 cm. Dependent layering of high attenuation material within the gallbladder is consistent with small stones or layering sludge. Within the region of the gallbladder neck, there is a high attenuation structure which measures 1.7 cm.  This is less apparent on the delayed images.  It is not clear whether this represents a gallstone or lymph node in the porta hepatis.  This is best seen on image number 28.  There is pericholecystic fluid or gallbladder wall thickening.  No focal abnormality identified within the spleen.  There is a left renal cysts.  There are patchy areas of renal parenchymal thinning bilaterally, left greater than right.  No focal abnormality identified within the pancreas.  The stomach and small bowel loops are normal in appearance. Colonic loops are normal in caliber and wall thickness.  There is moderate stool within the rectosigmoid colon. Small umbilical hernia contains only mesenteric fat.  The urinary bladder is markedly distended.  The uterus is present. No evidence for adnexal mass or free pelvic fluid. No evidence for aortic aneurysm.  Degenerative changes are seen throughout the lower thoracic and lumbar spine.  Patient has had multilevel posterior fusion, creating significant artifact. Prior right hip  IMPRESSION:  1.  Bilateral lower lobe infiltrates and small effusions. 2.  Coronary stent. 3.  Small amount of ascites.  4.  Distended gallbladder with pericholecystic fluid or gallbladder wall thickening. 5.  Suspect a layering gallstones or debris/sludge. 6.  Marked distension of  the urinary bladder. 7.  Small umbilical hernia, containing mesenteric fat.  The findings were discussed with Jarrett Soho Muthersbaugh on 12/20/2012 at 7:30 p.m.   Original Report Authenticated By: Nolon Nations, M.D.    Ir Perc Cholecystostomy  12/21/2012  *RADIOLOGY REPORT*  Clinical Data:Distended thick walled  gallbladder with layering stones and surrounding inflammatory/edematous changes on CT. Elevated white blood cell count, fever.  PERCUTANEOUS CHOLECYSTOSTOMY TUBE PLACEMENT WITH ULTRASOUND AND FLUOROSCOPIC GUIDANCE:  Technique: The procedure, risks (including but not limited to bleeding, infection, organ damage), benefits, and alternatives were explained to the family.  Questions regarding the procedure were encouraged and answered.  The family understands and consents to the procedure.Survey ultrasound of the abdomen was performed and an appropriate skin entry site was identified. Skin site was marked, prepped with Betadine, and draped in usual sterile fashion, and infiltrated locally with 1% lidocaine.  Intravenous Fentanyl and Versed were administered as conscious sedation during continuous cardiorespiratory monitoring by the radiology RN, with a total moderate sedation time of 10 minutes.   Under real-time ultrasound guidance, gallbladder was accessed using a transhepatic approach with a 21-gauge needle. Ultrasound image documentation was saved. Blood tinged bile returned through the hub. Needle was exchanged over a 018 guidewire for transitional dilator which allowed placement of 035 J wire. Over this, a 10.2 French pigtail catheter was advanced and formed centrally in the gallbladder lumen. Small contrast injection confirmed appropriate position. Catheter secured externally with 0 Prolene suture and placed external drain bag. Patient tolerated the procedure well, with no immediate complication. 20 ml of bloody bile were aspirated, a sample sent for routine abscess culture.  IMPRESSION:  1. Technically successful percutaneous cholecystostomy tube placement with ultrasound and fluoroscopic guidance.   Original Report Authenticated By: D. Wallace Going, MD    Dg Chest Portable 1 View  12/20/2012  *RADIOLOGY REPORT*  Clinical Data: Tachycardia.  PORTABLE CHEST - 1 VIEW  Comparison: 10/17/2012  Findings: Lung  volumes are extremely low with bibasilar atelectasis present.  No overt edema or consolidation.  No pleural fluid identified.  Stable appearance of the thoracic spine stimulator device.  Stable advanced degenerative changes of the left glenohumeral joint.  IMPRESSION: Low volumes with bibasilar atelectasis.   Original Report Authenticated By: Aletta Edouard, M.D.     Anti-infectives: Anti-infectives   Start     Dose/Rate Route Frequency Ordered Stop   12/21/12 1200  vancomycin (VANCOCIN) 1,250 mg in sodium chloride 0.9 % 250 mL IVPB  Status:  Discontinued     1,250 mg 166.7 mL/hr over 90 Minutes Intravenous Every 24 hours 12/20/12 2322 12/21/12 0841   12/21/12 1200  vancomycin (VANCOCIN) IVPB 750 mg/150 ml premix     750 mg 150 mL/hr over 60 Minutes Intravenous Every 12 hours 12/21/12 0841     12/20/12 2359  piperacillin-tazobactam (ZOSYN) IVPB 3.375 g     3.375 g 12.5 mL/hr over 240 Minutes Intravenous Every 8 hours 12/20/12 2307     12/20/12 2359  vancomycin (VANCOCIN) 500 mg in sodium chloride 0.9 % 100 mL IVPB     500 mg 100 mL/hr over 60 Minutes Intravenous  Once 12/20/12 2322 12/21/12 0402   12/20/12 1800  vancomycin (VANCOCIN) IVPB 1000 mg/200 mL premix     1,000 mg 200 mL/hr over 60 Minutes Intravenous  Once 12/20/12 1746 12/20/12 2035   12/20/12 1800  piperacillin-tazobactam (ZOSYN) IVPB 3.375 g     3.375 g 12.5 mL/hr over 240 Minutes Intravenous  Once 12/20/12 1746 12/20/12 1841  Assessment/Plan: Cholecystitis  I am concerned she has cholangitis with elevated bilirubin, fever, high wbc.  GB is drained but I will ask gi to see today.    [facility] 12/22/2012

## 2012-12-22 NOTE — Progress Notes (Signed)
Addendum to earlier note:  Cholecystostomy injection:  No opacification.  LFT's (transaminases) 50% improved from yesterday.  WBC improving.  CT did not show any biliary dilatation.  Discussed w/ Dr. Donne Hazel and Dr. Thereasa Solo.  1.  Pt not good candidate for MRCP (too uncomfortable and fidgety).  2. Low plts are idiopathic, but most recent count shows no improvement following overnight Tx of plts.  3.  Current plan is to get bedside u/s this evening to try to get better delineation of biliary tree, re-assess clinically and labs in a.m.--if LFT's keep coming down and plts don't improve significantly, it may be best to hold off on ERCP and/or EUS in this elderly patient.  4.  Dr. Amedeo Plenty will re-evaluate in a.m.  Cleotis Nipper, M.D. [phone]

## 2012-12-22 NOTE — Progress Notes (Signed)
[facility] Progress Note [place] TEAM 1 - Stepdown/ICU TEAM   [name] [name] CWU:[id number] DOB: [birth year] DOA: 12/20/2012 PCP: Mathews Argyle, MD  Brief narrative: 79 y.o. Female w/ a history of HTN; DJD s/p lumbar disk surgery 2005; Osteopenia; Dyslipidemia; Thrombocytopenia; and CAD who presented with confusion and abdominal pain x24hrs. She lives at the [facility] and is wheelchair-bound secondary to severe back pain. She was brought in to emergency department where she was found to have cholecystitis and likely sepsis with fever and tachycardia. General surgery was called but felt that she was not an operative candidate. Instead interventional radiology was consulted to place a percutaneous biliary drain.  In the emergency department patient received vancomycin and Zosyn, and it was noted that she was thrombocytopenic (is chronic) and it was decided that she would need a platelet transfusion prior to a procedure. Patient has history of coronary disease with DES stenting to her LAD 10/20/2012. She is currently on Brillinta.  Per cardiology in emergent situation Pattricia Boss will have to be held until it is deemed safe to restart it by IR after percutaneous drain has been placed.   Assessment/Plan:  Cholecystitis, acute As per Gen Surg - now s/p percutaneous drainage per IR - drain in place and draining dark fluid  Severe Sepsis WBC 34k & Temp 101.8 at presentation - met criteria for sepsis w/ known source of infection - BP stable/elevated, but altered mentation/end-organ failure therefore met criteria for severe sepsis - mentation improving, no longer febrile, and WBC declining    ?Cholangitis  T bil has risen, despite improvement in other LFTs - pt denies feeling any better, despite falling WBC - Gen Surg consulted GI to eval - cont broad abx coverage - f/u on injection via perc choly tube to determine if ERCP will be indicated  Tachycardia/SVT Likely combination of  physiologic tachy (fever, sepsis) + BB withdrawal + pain (on high dose narcotic chronically) - cont IV BB - tx fever - follow on tele   Malignant HTN Likely being driven by severe pain - add hydralazine - increase pain meds (on high dose chronic narcotic) - increase BB - consider adding clonidine patch   Chronic Idiopathic Thrombocytopenia plt 42k at presentation - baseline appears to be ~40-60k - dropped signif this am - now s/p another infusion of plts - follow trend - no evidence of signif bleeding at this time   Severe hypokalemia Replete via IV - Mg borderline so will supplement - follow   Chronic back pain On chronic high dose narcotics - adjust narcotic tx as pt c/o ongoing severe pain   CAD s/p DES to LAD x2 Feb 2014 NSTEMI 2/14 => LHC 10/20/12: dLM 20%, pLAD 99%, mLAD 60% followed by 99%, oD1 99%, and pD2 30%, mild plaque disease in the CFX and RCA. PCI: Xience Xpedition DES to the proximal and mid LAD - Effient on hold for now until IR feels is safe to resume   Code Status: FULL Family Communication: No family present at time of visit Disposition Plan: SDU  Consultants: Gen Surg IR [place] Cards  Procedures: 4/6 percutaneous choly tube per IR 4/7 injection of choly tube to assess for ductal stricture/obstruction   Antibiotics: Zosyn 4/5 >> Vanc 4/5 >>  DVT prophylaxis: SCDs   HPI/Subjective: The patient is much more alert today.  She is complaining of constant pain in the abdomen.  She complains of constant nausea.  She denies chest pain fevers or chills at the present time.  She denies shortness of breath.  Objective: Blood pressure 190/113, pulse 66, temperature 98.4 F (36.9 C), temperature source Oral, resp. rate 30, height _0  (1.575 m), weight 89.6 kg (197 lb 8.5 oz), SpO2 91.00%.  Intake/Output Summary (Last 24 hours) at 12/22/12 1338 Last data filed at 12/22/12 1200  Gross per 24 hour  Intake 1450.5 ml  Output   1370 ml  Net   80.5 ml    Exam: General: No acute respiratory distress at rest Lungs: Decreased breath sounds in bilateral bases but no focal crackles with no wheeze and good air movement throughout other fields Cardiovascular: Tachycardic but regular without gallop or rub or appreciable murmur Abdomen: Overweight, nondistended, tender to palpation from epigastrium to right upper quadrant, no rebound, soft, bowel sounds hypoactive  Extremities: No significant cyanosis, clubbing, or edema bilateral lower extremities  Data Reviewed: Basic Metabolic Panel:  Recent Labs Lab 12/20/12 1706 12/21/12 0500 12/21/12 1818 12/22/12 0445  NA 138 142 140 144  K 2.5* 2.6* 3.2* 3.0*  CL 95* 100 101 106  CO2 _1 GLUCOSE 223* 152* 140* 153*  BUN _2 CREATININE 0.89 1.03 0.92 0.83  CALCIUM 9.5 8.9 9.0 9.0  MG  --  1.5  --   --   PHOS  --  3.0  --   --    Liver Function Tests:  Recent Labs Lab 12/20/12 1706 12/21/12 0500 12/22/12 0445  AST 23 268* 103*  ALT 15 220* 125*  ALKPHOS 110 274* 241*  BILITOT 1.1 5.7* 5.9*  PROT 7.2 6.2 5.8*  ALBUMIN 3.3* 2.7* 2.2*    Recent Labs Lab 12/20/12 1706  LIPASE 12   CBC:  Recent Labs Lab 12/20/12 1706 12/21/12 0500 12/22/12 0445 12/22/12 0610  WBC 34.5* 22.3* 16.6* 15.8*  NEUTROABS 32.1*  --   --   --   HGB 18.2* 15.2* 14.2 14.6  HCT 49.4* 42.5 40.6 40.7  MCV 88.7 89.1 89.0 88.3  PLT 42* 47* QUESTIONABLE RESULTS, RECOMMEND RECOLLECT TO VERIFY 18*    Recent Results (from the past 240 hour(s))  CULTURE, BLOOD (ROUTINE X 2)     Status: None   Collection Time    12/20/12  5:00 PM      Result Value Range Status   Specimen Description BLOOD RIGHT ARM   Final   Special Requests BOTTLES DRAWN AEROBIC AND ANAEROBIC 10CC EA   Final   Culture  Setup Time 12/21/2012 01:54   Final   Culture     Final   Value:        BLOOD CULTURE RECEIVED NO GROWTH TO DATE CULTURE WILL BE HELD FOR 5 DAYS BEFORE ISSUING A FINAL NEGATIVE REPORT   Report Status  PENDING   Incomplete  CULTURE, BLOOD (ROUTINE X 2)     Status: None   Collection Time    12/20/12  5:20 PM      Result Value Range Status   Specimen Description BLOOD RIGHT HAND   Final   Special Requests BOTTLES DRAWN AEROBIC AND ANAEROBIC 10CC EA   Final   Culture  Setup Time 12/21/2012 01:54   Final   Culture     Final   Value:        BLOOD CULTURE RECEIVED NO GROWTH TO DATE CULTURE WILL BE HELD FOR 5 DAYS BEFORE ISSUING A FINAL NEGATIVE REPORT   Report Status PENDING   Incomplete  MRSA PCR SCREENING     Status: None  Collection Time    12/20/12 11:19 PM      Result Value Range Status   MRSA by PCR NEGATIVE  NEGATIVE Final   Comment:            The GeneXpert MRSA Assay (FDA     approved for NASAL specimens     only), is one component of a     comprehensive MRSA colonization     surveillance program. It is not     intended to diagnose MRSA     infection nor to guide or     monitor treatment for     MRSA infections.  CULTURE, ROUTINE-ABSCESS     Status: None   Collection Time    12/21/12 10:38 AM      Result Value Range Status   Specimen Description ABSCESS   Final   Special Requests DRAIN FROM CHOLECYSTOSTOMY SITE   Final   Gram Stain     Final   Value: RARE WBC NO SQUAMOUS EPITHELIAL CELLS SEEN     FEW GRAM NEGATIVE RODS     FEW GRAM POSITIVE RODS   Culture Culture reincubated for better growth   Final   Report Status PENDING   Incomplete     Studies:  Recent x-ray studies have been reviewed in detail by the Attending Physician  Scheduled Meds:  Scheduled Meds: . metoprolol  10 mg Intravenous Q6H  . piperacillin-tazobactam (ZOSYN)  IV  3.375 g Intravenous Q8H  . sodium chloride  3 mL Intravenous Q12H  . vancomycin  750 mg Intravenous Q12H   Continuous Infusions: . 0.9 % NaCl with KCl 20 mEq / L 125 mL/hr at 12/22/12 1006    Time spent on care of this patient: 67mns   MCCLUNG,JEFFREY T  [facility] Office  [phone] - Text Page per  AShea Evansas per below:  On-Call/Text Page:      aShea [email or web address]      password [email or web address]  If 7PM-7AM, please contact night-coverage [email or web address] Password [email or web address] 12/22/2012, 1:38 PM   LOS: 2 days

## 2012-12-22 NOTE — Progress Notes (Signed)
Patients bp is high, 187/93. Notified MD, no orders received. Will continue to monitor.

## 2012-12-22 NOTE — Progress Notes (Signed)
CRITICAL VALUE ALERT  Critical value received: platelets 18  Date of notification:  12/22/2012  Time of notification:  0640  Critical value read back:yes  Nurse who received alert:  L. Morene Rankins  MD notified (1st page):  R. Reidler  Time of first page:  [phone]  MD notified (2nd page):  Time of second page:  Responding MD:  Koleen Nimrod  Time MD responded:  [phone]

## 2012-12-22 NOTE — Consult Note (Signed)
[facility] Consult Note  Referring Provider: No ref. provider found Primary Care Physician:  Mathews Argyle, MD Primary Gastroenterologist:  Dr.  Laurel Dimmer Complaint: Abdominal pain HPI: [name] [name] is an 79 y.o. white  female  who presented with abdominal pain nausea vomiting elevated white blood cell count and fever with evidence of cholecystitis on CT scan. She was not felt to be a surgical candidate and interventional radiology was consulted and placed a percutaneous cholecystostomy tube. During the interim her liver function tests have become elevated although her white blood cell count has improved with drainage and antibiotics. She does not feel any better today.  Past Medical History  Diagnosis Date  . HTN (hypertension)   . DJD (degenerative joint disease)   . Osteopenia   . Dyslipidemia   . Venous ulcer   . Thrombocytopenia   . CAD (coronary artery disease)     a. NSTEMI 2/14 => LHC 10/20/12:  dLM 20%, pLAD 99%, mLAD 60% followed by 99%, oD1 99%, and pD2 30%, mild plaque disease in the CFX and RCA. PCI: Xience Xpedition DES to the proximal and mid LAD  . Hx of echocardiogram     a. Echo 10/18/12: mod LVH, EF 60-65%, Gr 1 diast dysfn, mild AI, mild LAE.    Past Surgical History  Procedure Laterality Date  . Back surgery      Lumbar disc  . Hip surgery      R THR  . Eye surgery      Blepharoplasty  . Insertion / placement / revision neurostimulator      Medications Prior to Admission  Medication Sig Dispense Refill  . acidophilus (RISAQUAD) CAPS Take 1 capsule by mouth daily.      Marland Kitchen amLODipine (NORVASC) 2.5 MG tablet Take 1 tablet (2.5 mg total) by mouth daily.  30 tablet  0  . anti-nausea (EMETROL) solution Take 15 mLs by mouth every 15 (fifteen) minutes as needed for nausea.      Marland Kitchen aspirin 81 MG tablet Take 81 mg by mouth daily.      Marland Kitchen atorvastatin (LIPITOR) 80 MG tablet Take 80 mg by mouth daily. RX is written for 6pm. Nursing Home administers at  8am      . Calcium Carbonate-Vitamin D (CALCARB 600/D PO) Take 2 tablets by mouth daily.      . fluticasone (FLONASE) 50 MCG/ACT nasal spray Place 2 sprays into the nose daily.      . furosemide (LASIX) 80 MG tablet Take 80 mg by mouth 2 (two) times daily.       Marland Kitchen gabapentin (NEURONTIN) 800 MG tablet Take 800 mg by mouth 4 (four) times daily.      Marland Kitchen glucosamine-chondroitin 500-400 MG tablet Take 2 tablets by mouth daily.      . metoprolol (LOPRESSOR) 50 MG tablet Take 1 tablet (50 mg total) by mouth 2 (two) times daily. Also with 25 mg tab to = 75 mg twice daily      . metoprolol tartrate (LOPRESSOR) 25 MG tablet Take 1 tablet (25 mg total) by mouth 2 (two) times daily. With 50 mg tab twice daily = 75 mg twice daily      . morphine (MS CONTIN) 100 MG 12 hr tablet Take 100 mg by mouth 2 (two) times daily.      . Multiple Vitamin (MULTIVITAMIN WITH MINERALS) TABS Take 1 tablet by mouth daily.      Marland Kitchen oxyCODONE (ROXICODONE) 15 MG immediate release tablet Take 15 mg  by mouth every 4 (four) hours as needed. For pain .  Max 8 tabs in 24 hrs      . potassium chloride (K-DUR) 10 MEQ tablet Take 10 mEq by mouth 2 (two) times daily.      . promethazine (PHENERGAN) 12.5 MG tablet Take 12.5 mg by mouth every 6 (six) hours as needed for nausea.      Marland Kitchen saccharomyces boulardii (FLORASTOR) 250 MG capsule Take 250 mg by mouth 2 (two) times daily.      . Ticagrelor (BRILINTA) 90 MG TABS tablet Take 1 tablet (90 mg total) by mouth 2 (two) times daily.  60 tablet  0  . vitamin B-12 (CYANOCOBALAMIN) 1000 MCG tablet Take 1,000 mcg by mouth daily.      . vitamin [name] (ASCORBIC ACID) 500 MG tablet Take 500 mg by mouth daily.        Allergies: No Known Allergies  Family History  Problem Relation Age of Onset  . CAD Father 6    Vague history     Social History:  reports that she has never smoked. She does not have any smokeless tobacco history on file. She reports that she does not drink alcohol or use illicit  drugs.  Review of Systems: negative except as above   Blood pressure 210/100, pulse 117, temperature 97.9 F (36.6 [name]), temperature source Oral, resp. rate 29, height 5' 2" (1.575 m), weight 89.6 kg (197 lb 8.5 oz), SpO2 91.00%. Head: Normocephalic, without obvious abnormality, atraumatic Neck: no adenopathy, no carotid bruit, no JVD, supple, symmetrical, trachea midline and thyroid not enlarged, symmetric, no tenderness/mass/nodules Resp: clear to auscultation bilaterally Cardio: regular rate and rhythm, S1, S2 normal, no murmur, click, rub or gallop GI: Abdomen soft moderately tender nondistended Extremities: extremities normal, atraumatic, no cyanosis or edema  Results for orders placed during the hospital encounter of 12/20/12 (from the past 48 hour(s))  CULTURE, BLOOD (ROUTINE X 2)     Status: None   Collection Time    12/20/12  5:00 PM      Result Value Range   Specimen Description BLOOD RIGHT ARM     Special Requests BOTTLES DRAWN AEROBIC AND ANAEROBIC 10CC EA     Culture  Setup Time 12/21/2012 01:54     Culture       Value:        BLOOD CULTURE RECEIVED NO GROWTH TO DATE CULTURE WILL BE HELD FOR 5 DAYS BEFORE ISSUING A FINAL NEGATIVE REPORT   Report Status PENDING    URINALYSIS, ROUTINE W REFLEX MICROSCOPIC     Status: Abnormal   Collection Time    12/20/12  5:05 PM      Result Value Range   Color, Urine YELLOW  YELLOW   APPearance CLEAR  CLEAR   Specific Gravity, Urine 1.016  1.005 - 1.030   pH 6.0  5.0 - 8.0   Glucose, UA NEGATIVE  NEGATIVE mg/dL   Hgb urine dipstick NEGATIVE  NEGATIVE   Bilirubin Urine NEGATIVE  NEGATIVE   Ketones, ur NEGATIVE  NEGATIVE mg/dL   Protein, ur 100 (*) NEGATIVE mg/dL   Urobilinogen, UA 0.2  0.0 - 1.0 mg/dL   Nitrite NEGATIVE  NEGATIVE   Leukocytes, UA NEGATIVE  NEGATIVE  URINE MICROSCOPIC-ADD ON     Status: Abnormal   Collection Time    12/20/12  5:05 PM      Result Value Range   Squamous Epithelial / LPF RARE  RARE   WBC, UA 0-2   <  3 WBC/hpf   RBC / HPF 0-2  <3 RBC/hpf   Bacteria, UA RARE  RARE   Casts HYALINE CASTS (*) NEGATIVE  CBC WITH DIFFERENTIAL     Status: Abnormal   Collection Time    12/20/12  5:06 PM      Result Value Range   WBC 34.5 (*) 4.0 - 10.5 K/uL   RBC 5.57 (*) 3.87 - 5.11 MIL/uL   Hemoglobin 18.2 (*) 12.0 - 15.0 g/dL   HCT 49.4 (*) 36.0 - 46.0 %   MCV 88.7  78.0 - 100.0 fL   MCH 32.7  26.0 - 34.0 pg   MCHC 36.8 (*) 30.0 - 36.0 g/dL   RDW 13.7  11.5 - 15.5 %   Platelets 42 (*) 150 - 400 K/uL   Comment: PLATELET COUNT CONFIRMED BY SMEAR   Neutrophils Relative 93 (*) 43 - 77 %   Lymphocytes Relative 3 (*) 12 - 46 %   Monocytes Relative 4  3 - 12 %   Eosinophils Relative 0  0 - 5 %   Basophils Relative 0  0 - 1 %   Neutro Abs 32.1 (*) 1.7 - 7.7 K/uL   Lymphs Abs 1.0  0.7 - 4.0 K/uL   Monocytes Absolute 1.4 (*) 0.1 - 1.0 K/uL   Eosinophils Absolute 0.0  0.0 - 0.7 K/uL   Basophils Absolute 0.0  0.0 - 0.1 K/uL   Smear Review MORPHOLOGY UNREMARKABLE    COMPREHENSIVE METABOLIC PANEL     Status: Abnormal   Collection Time    12/20/12  5:06 PM      Result Value Range   Sodium 138  135 - 145 mEq/L   Potassium 2.5 (*) 3.5 - 5.1 mEq/L   Comment: CRITICAL RESULT CALLED TO, READ BACK BY AND VERIFIED WITH:     B.BERARD,RN 12/20/12 1810 EHOWARD   Chloride 95 (*) 96 - 112 mEq/L   CO2 28  19 - 32 mEq/L   Glucose, Bld 223 (*) 70 - 99 mg/dL   BUN 15  6 - 23 mg/dL   Creatinine, Ser 0.89  0.50 - 1.10 mg/dL   Calcium 9.5  8.4 - 10.5 mg/dL   Total Protein 7.2  6.0 - 8.3 g/dL   Albumin 3.3 (*) 3.5 - 5.2 g/dL   AST 23  0 - 37 U/L   ALT 15  0 - 35 U/L   Alkaline Phosphatase 110  39 - 117 U/L   Total Bilirubin 1.1  0.3 - 1.2 mg/dL   GFR calc non Af Amer 60 (*) >90 mL/min   GFR calc Af Amer 70 (*) >90 mL/min   Comment:            The eGFR has been calculated     using the CKD EPI equation.     This calculation has not been     validated in all clinical     situations.     eGFR's persistently     <90  mL/min signify     possible Chronic Kidney Disease.  LIPASE, BLOOD     Status: None   Collection Time    12/20/12  5:06 PM      Result Value Range   Lipase 12  11 - 59 U/L  PROCALCITONIN     Status: None   Collection Time    12/20/12  5:10 PM      Result Value Range   Procalcitonin 2.85     Comment:  Interpretation:     PCT > 2 ng/mL:     Systemic infection (sepsis) is likely,     unless other causes are known.     (NOTE)             ICU PCT Algorithm               Non ICU PCT Algorithm        ----------------------------     ------------------------------             PCT < 0.25 ng/mL                 PCT < 0.1 ng/mL         Stopping of antibiotics            Stopping of antibiotics           strongly encouraged.               strongly encouraged.        ----------------------------     ------------------------------           PCT level decrease by               PCT < 0.25 ng/mL           >= 80% from peak PCT           OR PCT 0.25 - 0.5 ng/mL          Stopping of antibiotics                                                 encouraged.         Stopping of antibiotics               encouraged.        ----------------------------     ------------------------------           PCT level decrease by              PCT >= 0.25 ng/mL           < 80% from peak PCT            AND PCT >= 0.5 ng/mL            Continuing antibiotics                                                  encouraged.           Continuing antibiotics                encouraged.        ----------------------------     ------------------------------         PCT level increase compared          PCT > 0.5 ng/mL             with peak PCT AND              PCT >= 0.5 ng/mL             Escalation of antibiotics  strongly encouraged.          Escalation of antibiotics            strongly encouraged.  CULTURE, BLOOD (ROUTINE X 2)     Status: None   Collection Time     12/20/12  5:20 PM      Result Value Range   Specimen Description BLOOD RIGHT HAND     Special Requests BOTTLES DRAWN AEROBIC AND ANAEROBIC 10CC EA     Culture  Setup Time 12/21/2012 01:54     Culture       Value:        BLOOD CULTURE RECEIVED NO GROWTH TO DATE CULTURE WILL BE HELD FOR 5 DAYS BEFORE ISSUING A FINAL NEGATIVE REPORT   Report Status PENDING    POCT I-STAT TROPONIN I     Status: None   Collection Time    12/20/12  5:27 PM      Result Value Range   Troponin i, poc 0.02  0.00 - 0.08 ng/mL   Comment 3            Comment: Due to the release kinetics of cTnI,     a negative result within the first hours     of the onset of symptoms does not rule out     myocardial infarction with certainty.     If myocardial infarction is still suspected,     repeat the test at appropriate intervals.  CG4 I-STAT (LACTIC ACID)     Status: Abnormal   Collection Time    12/20/12  5:34 PM      Result Value Range   Lactic Acid, Venous 4.48 (*) 0.5 - 2.2 mmol/L  POCT I-STAT 3, BLOOD GAS (G3+)     Status: Abnormal   Collection Time    12/20/12  9:11 PM      Result Value Range   pH, Arterial 7.440  7.350 - 7.450   pCO2 arterial 46.1 (*) 35.0 - 45.0 mmHg   pO2, Arterial 92.0  80.0 - 100.0 mmHg   Bicarbonate 30.8 (*) 20.0 - 24.0 mEq/L   TCO2 32  0 - 100 mmol/L   O2 Saturation 97.0     Acid-Base Excess 6.0 (*) 0.0 - 2.0 mmol/L   Patient temperature 101.8 F     Collection site RADIAL, ALLEN'S TEST ACCEPTABLE     Drawn by RT     Sample type ARTERIAL    CG4 I-STAT (LACTIC ACID)     Status: None   Collection Time    12/20/12  9:21 PM      Result Value Range   Lactic Acid, Venous 2.07  0.5 - 2.2 mmol/L  MRSA PCR SCREENING     Status: None   Collection Time    12/20/12 11:19 PM      Result Value Range   MRSA by PCR NEGATIVE  NEGATIVE   Comment:            The GeneXpert MRSA Assay (FDA     approved for NASAL specimens     only), is one component of a     comprehensive MRSA colonization      surveillance program. It is not     intended to diagnose MRSA     infection nor to guide or     monitor treatment for     MRSA infections.  TYPE AND SCREEN     Status: None   Collection Time    12/20/12 11:20  PM      Result Value Range   ABO/RH(D) A POS     Antibody Screen NEG     Sample Expiration 12/23/2012    PREPARE PLATELET PHERESIS     Status: None   Collection Time    12/20/12 11:20 PM      Result Value Range   Unit Number [id number]     Blood Component Type PLTPHER LR1     Unit division 00     Status of Unit ISSUED,FINAL     Transfusion Status OK TO TRANSFUSE     Unit Number [id number]     Blood Component Type PLTPHER LR2     Unit division 00     Status of Unit REL FROM [facility]     Transfusion Status OK TO TRANSFUSE     Unit Number [id number]     Blood Component Type PLTPHER LR1     Unit division 00     Status of Unit ISSUED,FINAL     Transfusion Status OK TO TRANSFUSE    MAGNESIUM     Status: None   Collection Time    12/21/12  5:00 AM      Result Value Range   Magnesium 1.5  1.5 - 2.5 mg/dL  PHOSPHORUS     Status: None   Collection Time    12/21/12  5:00 AM      Result Value Range   Phosphorus 3.0  2.3 - 4.6 mg/dL  TSH     Status: None   Collection Time    12/21/12  5:00 AM      Result Value Range   TSH 0.362  0.350 - 4.500 uIU/mL  COMPREHENSIVE METABOLIC PANEL     Status: Abnormal   Collection Time    12/21/12  5:00 AM      Result Value Range   Sodium 142  135 - 145 mEq/L   Potassium 2.6 (*) 3.5 - 5.1 mEq/L   Comment: CRITICAL RESULT CALLED TO, READ BACK BY AND VERIFIED WITH:     LOVE S.,RN 12/21/12 1211 BY JONESJ   Chloride 100  96 - 112 mEq/L   CO2 30  19 - 32 mEq/L   Glucose, Bld 152 (*) 70 - 99 mg/dL   BUN 20  6 - 23 mg/dL   Creatinine, Ser 1.03  0.50 - 1.10 mg/dL   Calcium 8.9  8.4 - 10.5 mg/dL   Total Protein 6.2  6.0 - 8.3 g/dL   Albumin 2.7 (*) 3.5 - 5.2 g/dL   AST 268 (*) 0 - 37 U/L   ALT 220 (*) 0 - 35 U/L   Alkaline  Phosphatase 274 (*) 39 - 117 U/L   Total Bilirubin 5.7 (*) 0.3 - 1.2 mg/dL   GFR calc non Af Amer 50 (*) >90 mL/min   GFR calc Af Amer 58 (*) >90 mL/min   Comment:            The eGFR has been calculated     using the CKD EPI equation.     This calculation has not been     validated in all clinical     situations.     eGFR's persistently     <90 mL/min signify     possible Chronic Kidney Disease.  CBC     Status: Abnormal   Collection Time    12/21/12  5:00 AM      Result Value Range   WBC 22.3 (*) 4.0 - 10.5 K/uL  RBC 4.77  3.87 - 5.11 MIL/uL   Hemoglobin 15.2 (*) 12.0 - 15.0 g/dL   HCT 42.5  36.0 - 46.0 %   MCV 89.1  78.0 - 100.0 fL   MCH 31.9  26.0 - 34.0 pg   MCHC 35.8  30.0 - 36.0 g/dL   RDW 13.9  11.5 - 15.5 %   Platelets 47 (*) 150 - 400 K/uL   Comment: CONSISTENT WITH PREVIOUS RESULT  HEMOGLOBIN A1C     Status: Abnormal   Collection Time    12/21/12  5:00 AM      Result Value Range   Hemoglobin A1C 5.8 (*) <5.7 %   Comment: (NOTE)                                                                               According to the ADA Clinical Practice Recommendations for 2011, when     HbA1c is used as a screening test:      >=6.5%   Diagnostic of Diabetes Mellitus               (if abnormal result is confirmed)     5.7-6.4%   Increased risk of developing Diabetes Mellitus     References:Diagnosis and Classification of Diabetes Mellitus,Diabetes     ULAG,5364,68(EHOZY 1):S62-S69 and Standards of Medical Care in             Diabetes - 2011,Diabetes Care,2011,34 (Suppl 1):S11-S61.   Mean Plasma Glucose 120 (*) <117 mg/dL  CULTURE, ROUTINE-ABSCESS     Status: None   Collection Time    12/21/12 10:38 AM      Result Value Range   Specimen Description ABSCESS     Special Requests DRAIN FROM CHOLECYSTOSTOMY SITE     Gram Stain       Value: RARE WBC NO SQUAMOUS EPITHELIAL CELLS SEEN     FEW GRAM NEGATIVE RODS     FEW GRAM POSITIVE RODS   Culture Culture reincubated for  better growth     Report Status PENDING    LACTIC ACID, PLASMA     Status: None   Collection Time    12/21/12 10:50 AM      Result Value Range   Lactic Acid, Venous 1.6  0.5 - 2.2 mmol/L  TROPONIN I     Status: None   Collection Time    12/21/12 10:55 AM      Result Value Range   Troponin I <0.30  <0.30 ng/mL   Comment:            Due to the release kinetics of cTnI,     a negative result within the first hours     of the onset of symptoms does not rule out     myocardial infarction with certainty.     If myocardial infarction is still suspected,     repeat the test at appropriate intervals.  BASIC METABOLIC PANEL     Status: Abnormal   Collection Time    12/21/12  6:18 PM      Result Value Range   Sodium 140  135 - 145 mEq/L   Potassium 3.2 (*) 3.5 - 5.1 mEq/L  Chloride 101  96 - 112 mEq/L   CO2 30  19 - 32 mEq/L   Glucose, Bld 140 (*) 70 - 99 mg/dL   BUN 18  6 - 23 mg/dL   Creatinine, Ser 0.92  0.50 - 1.10 mg/dL   Calcium 9.0  8.4 - 10.5 mg/dL   GFR calc non Af Amer 58 (*) >90 mL/min   GFR calc Af Amer 67 (*) >90 mL/min   Comment:            The eGFR has been calculated     using the CKD EPI equation.     This calculation has not been     validated in all clinical     situations.     eGFR's persistently     <90 mL/min signify     possible Chronic Kidney Disease.  COMPREHENSIVE METABOLIC PANEL     Status: Abnormal   Collection Time    12/22/12  4:45 AM      Result Value Range   Sodium 144  135 - 145 mEq/L   Potassium 3.0 (*) 3.5 - 5.1 mEq/L   Chloride 106  96 - 112 mEq/L   CO2 29  19 - 32 mEq/L   Glucose, Bld 153 (*) 70 - 99 mg/dL   BUN 18  6 - 23 mg/dL   Creatinine, Ser 0.83  0.50 - 1.10 mg/dL   Calcium 9.0  8.4 - 10.5 mg/dL   Total Protein 5.8 (*) 6.0 - 8.3 g/dL   Albumin 2.2 (*) 3.5 - 5.2 g/dL   AST 103 (*) 0 - 37 U/L   ALT 125 (*) 0 - 35 U/L   Alkaline Phosphatase 241 (*) 39 - 117 U/L   Total Bilirubin 5.9 (*) 0.3 - 1.2 mg/dL   GFR calc non Af Amer 65  (*) >90 mL/min   GFR calc Af Amer 76 (*) >90 mL/min   Comment:            The eGFR has been calculated     using the CKD EPI equation.     This calculation has not been     validated in all clinical     situations.     eGFR's persistently     <90 mL/min signify     possible Chronic Kidney Disease.  CBC     Status: Abnormal   Collection Time    12/22/12  4:45 AM      Result Value Range   WBC 16.6 (*) 4.0 - 10.5 K/uL   Comment: QUESTIONABLE RESULTS, RECOMMEND RECOLLECT TO VERIFY     PER WORLEY,LEE RN @ 0606 ON 12/22/12 BY LEONARD,A   RBC 4.56  3.87 - 5.11 MIL/uL   Comment: QUESTIONABLE RESULTS, RECOMMEND RECOLLECT TO VERIFY   Hemoglobin 14.2  12.0 - 15.0 g/dL   Comment: QUESTIONABLE RESULTS, RECOMMEND RECOLLECT TO VERIFY   HCT 40.6  36.0 - 46.0 %   Comment: QUESTIONABLE RESULTS, RECOMMEND RECOLLECT TO VERIFY   MCV 89.0  78.0 - 100.0 fL   Comment: QUESTIONABLE RESULTS, RECOMMEND RECOLLECT TO VERIFY   MCH 31.1  26.0 - 34.0 pg   Comment: QUESTIONABLE RESULTS, RECOMMEND RECOLLECT TO VERIFY   MCHC 35.0  30.0 - 36.0 g/dL   Comment: QUESTIONABLE RESULTS, RECOMMEND RECOLLECT TO VERIFY   RDW 14.2  11.5 - 15.5 %   Comment: QUESTIONABLE RESULTS, RECOMMEND RECOLLECT TO VERIFY   Platelets    150 - 400 K/uL   Value: QUESTIONABLE RESULTS, RECOMMEND RECOLLECT  TO VERIFY  CBC     Status: Abnormal   Collection Time    12/22/12  6:10 AM      Result Value Range   WBC 15.8 (*) 4.0 - 10.5 K/uL   RBC 4.61  3.87 - 5.11 MIL/uL   Hemoglobin 14.6  12.0 - 15.0 g/dL   HCT 40.7  36.0 - 46.0 %   MCV 88.3  78.0 - 100.0 fL   MCH 31.7  26.0 - 34.0 pg   MCHC 35.9  30.0 - 36.0 g/dL   RDW 14.4  11.5 - 15.5 %   Platelets 18 (*) 150 - 400 K/uL   Comment: REPEATED TO VERIFY     RESULTS VERIFIED VIA RECOLLECT     CRITICAL RESULT CALLED TO, READ BACK BY AND VERIFIED WITH:     WORLEY,L RN @ [phone] ON 12/22/12 BY LEONARD,A     PLATELET COUNT CONFIRMED BY SMEAR  PREPARE PLATELET PHERESIS     Status: None   Collection  Time    12/22/12  6:51 AM      Result Value Range   Unit Number [id number]     Blood Component Type PLTPHER LR2     Unit division 00     Status of Unit ISSUED     Transfusion Status OK TO TRANSFUSE     Ct Abdomen Pelvis W Contrast  12/20/2012  *RADIOLOGY REPORT*  Clinical Data: Abdominal pain.  Vomiting.  History of hypertension.  CT ABDOMEN AND PELVIS WITH CONTRAST  Technique:  Multidetector CT imaging of the abdomen and pelvis was performed following the standard protocol during bolus administration of intravenous contrast.  Contrast: 148m OMNIPAQUE IOHEXOL 300 MG/ML  SOLN CT of the chest 10/17/2012  Comparison: CT of the chest 10/17/2012  Findings: Lung bases show posterior consolidations bilaterally, associated with air bronchograms and small effusions.  The heart is mildly enlarged.  Coronary stent is identified.  There is significant artifact from spinal stimulator.  There is a small amount of ascites surrounding the liver. Periportal edema is noted. There is marked enlargement of the gallbladder.  Gallbladder measures greater than 12.0 x 5.4 cm. Dependent layering of high attenuation material within the gallbladder is consistent with small stones or layering sludge. Within the region of the gallbladder neck, there is a high attenuation structure which measures 1.7 cm.  This is less apparent on the delayed images.  It is not clear whether this represents a gallstone or lymph node in the porta hepatis.  This is best seen on image number 28.  There is pericholecystic fluid or gallbladder wall thickening.  No focal abnormality identified within the spleen.  There is a left renal cysts.  There are patchy areas of renal parenchymal thinning bilaterally, left greater than right.  No focal abnormality identified within the pancreas.  The stomach and small bowel loops are normal in appearance. Colonic loops are normal in caliber and wall thickness.  There is moderate stool within the rectosigmoid colon.  Small umbilical hernia contains only mesenteric fat.  The urinary bladder is markedly distended.  The uterus is present. No evidence for adnexal mass or free pelvic fluid. No evidence for aortic aneurysm.  Degenerative changes are seen throughout the lower thoracic and lumbar spine.  Patient has had multilevel posterior fusion, creating significant artifact. Prior right hip  IMPRESSION:  1.  Bilateral lower lobe infiltrates and small effusions. 2.  Coronary stent. 3.  Small amount of ascites.  4.  Distended gallbladder with pericholecystic  fluid or gallbladder wall thickening. 5.  Suspect a layering gallstones or debris/sludge. 6.  Marked distension of the urinary bladder. 7.  Small umbilical hernia, containing mesenteric fat.  The findings were discussed with Jarrett Soho Muthersbaugh on 12/20/2012 at 7:30 p.m.   Original Report Authenticated By: Nolon Nations, M.D.    Ir Perc Cholecystostomy  12/21/2012  *RADIOLOGY REPORT*  Clinical Data:Distended thick walled gallbladder with layering stones and surrounding inflammatory/edematous changes on CT. Elevated white blood cell count, fever.  PERCUTANEOUS CHOLECYSTOSTOMY TUBE PLACEMENT WITH ULTRASOUND AND FLUOROSCOPIC GUIDANCE:  Technique: The procedure, risks (including but not limited to bleeding, infection, organ damage), benefits, and alternatives were explained to the family.  Questions regarding the procedure were encouraged and answered.  The family understands and consents to the procedure.Survey ultrasound of the abdomen was performed and an appropriate skin entry site was identified. Skin site was marked, prepped with Betadine, and draped in usual sterile fashion, and infiltrated locally with 1% lidocaine.  Intravenous Fentanyl and Versed were administered as conscious sedation during continuous cardiorespiratory monitoring by the radiology RN, with a total moderate sedation time of 10 minutes.   Under real-time ultrasound guidance, gallbladder was accessed using  a transhepatic approach with a 21-gauge needle. Ultrasound image documentation was saved. Blood tinged bile returned through the hub. Needle was exchanged over a 018 guidewire for transitional dilator which allowed placement of 035 J wire. Over this, a 10.2 French pigtail catheter was advanced and formed centrally in the gallbladder lumen. Small contrast injection confirmed appropriate position. Catheter secured externally with 0 Prolene suture and placed external drain bag. Patient tolerated the procedure well, with no immediate complication. 20 ml of bloody bile were aspirated, a sample sent for routine abscess culture.  IMPRESSION:  1. Technically successful percutaneous cholecystostomy tube placement with ultrasound and fluoroscopic guidance.   Original Report Authenticated By: D. Wallace Going, MD    Dg Chest Portable 1 View  12/20/2012  *RADIOLOGY REPORT*  Clinical Data: Tachycardia.  PORTABLE CHEST - 1 VIEW  Comparison: 10/17/2012  Findings: Lung volumes are extremely low with bibasilar atelectasis present.  No overt edema or consolidation.  No pleural fluid identified.  Stable appearance of the thoracic spine stimulator device.  Stable advanced degenerative changes of the left glenohumeral joint.  IMPRESSION: Low volumes with bibasilar atelectasis.   Original Report Authenticated By: Aletta Edouard, M.D.     Assessment: Cholecystitis with rising liver function tests raising a question of common bile duct obstruction Plan:  Spoke with interventional radiology and they will try to opacify the common bile duct to assess for CBD stones/biliary obstruction, before proceeding  With more invasive ERCP [name] [name] 12/22/2012, 10:52 AM

## 2012-12-23 DIAGNOSIS — D696 Thrombocytopenia, unspecified: Secondary | ICD-10-CM

## 2012-12-23 DIAGNOSIS — E876 Hypokalemia: Secondary | ICD-10-CM

## 2012-12-23 LAB — PREPARE PLATELET PHERESIS: Unit division: 0

## 2012-12-23 LAB — HEPATIC FUNCTION PANEL
AST: 76 U/L — ABNORMAL HIGH (ref 0–37)
Albumin: 2.5 g/dL — ABNORMAL LOW (ref 3.5–5.2)
Alkaline Phosphatase: 347 U/L — ABNORMAL HIGH (ref 39–117)
Total Bilirubin: 3.9 mg/dL — ABNORMAL HIGH (ref 0.3–1.2)
Total Protein: 6.7 g/dL (ref 6.0–8.3)

## 2012-12-23 LAB — CBC
HCT: 40.9 % (ref 36.0–46.0)
Hemoglobin: 15 g/dL (ref 12.0–15.0)
MCH: 32.2 pg (ref 26.0–34.0)
MCHC: 36.7 g/dL — ABNORMAL HIGH (ref 30.0–36.0)
MCV: 87.8 fL (ref 78.0–100.0)
Platelets: 26 10*3/uL — CL (ref 150–400)
RBC: 4.66 MIL/uL (ref 3.87–5.11)
RDW: 14 % (ref 11.5–15.5)
WBC: 16.5 10*3/uL — ABNORMAL HIGH (ref 4.0–10.5)

## 2012-12-23 LAB — RENAL FUNCTION PANEL
CO2: 22 mEq/L (ref 19–32)
Calcium: 9.7 mg/dL (ref 8.4–10.5)
Chloride: 104 mEq/L (ref 96–112)
Creatinine, Ser: 0.65 mg/dL (ref 0.50–1.10)
GFR calc Af Amer: >90 mL/min (ref >90)
GFR calc non Af Amer: 82 mL/min — ABNORMAL LOW (ref >90)
Glucose, Bld: 140 mg/dL — ABNORMAL HIGH (ref 70–99)

## 2012-12-23 LAB — MAGNESIUM: Magnesium: 2.2 mg/dL (ref 1.5–2.5)

## 2012-12-23 MED ORDER — MORPHINE SULFATE 2 MG/ML IJ SOLN
INTRAMUSCULAR | Status: AC
  Administered 2012-12-23: 2 mg via INTRAVENOUS
  Filled 2012-12-23: qty 1

## 2012-12-23 MED ORDER — MORPHINE SULFATE ER 100 MG PO TBCR
100.0000 mg | EXTENDED_RELEASE_TABLET | Freq: Two times a day (BID) | ORAL | Status: DC
  Administered 2012-12-23 – 2012-12-26 (×6): 100 mg via ORAL
  Filled 2012-12-23 (×6): qty 1

## 2012-12-23 MED ORDER — SODIUM CHLORIDE 0.9 % IJ SOLN
INTRAMUSCULAR | Status: AC
  Filled 2012-12-23: qty 20

## 2012-12-23 NOTE — Evaluation (Signed)
Occupational Therapy Evaluation Patient Details Name: [name] [name] MRN: [id number] DOB: October 12, 1930 Today's Date: 12/23/2012 Time: 1191-4782 OT Time Calculation (min): 22 min  OT Assessment / Plan / Recommendation Clinical Impression  79 yo female admitted for AMS and abdominal pain. Pt lives at [place] home and is wheelchair boound due to severe back pain. Pt found to have cholecystits with fever and tachycardia likely sepsis. Ot to follow acutely recommend SNF for d/c    OT Assessment  Patient needs continued OT Services    Follow Up Recommendations  SNF    Barriers to Discharge      Equipment Recommendations  3 in 1 bedside comode;Wheelchair (measurements OT);Wheelchair cushion (measurements OT)    Recommendations for Other Services    Frequency  Min 2X/week    Precautions / Restrictions Precautions Precautions: Fall Restrictions Weight Bearing Restrictions: No   Pertinent Vitals/Pain 7-8 out 10 pain at Rt flank    ADL  Eating/Feeding: Set up Where Assessed - Eating/Feeding: Chair (coughing when drinking from draw) Lower Body Dressing: +1 Total assistance Where Assessed - Lower Body Dressing: Supported sitting Toilet Transfer: Moderate assistance Toilet Transfer Method: Sit to stand Toilet Transfer Equipment: Raised toilet seat with arms (or 3-in-1 over toilet) Equipment Used: Gait belt;Rolling walker Transfers/Ambulation Related to ADLs: Pt required (A) for sit<>Stand but once standing able to take small steps to chair Min (A). pt with decr gait velocity and slow progressing ADL Comments: pt reports completing all showering standing in shower completely independently. But patient also reports not walking and using power chair mainly. QUESTION prior status and reliability of the answers from patient at this time. Pt was able to report that the doctors do not feel surgery is required and that a gall stone has passed.     OT Diagnosis: Generalized weakness;Cognitive  deficits;Acute pain  OT Problem List: Decreased strength;Decreased activity tolerance;Impaired balance (sitting and/or standing);Decreased cognition;Decreased safety awareness;Decreased knowledge of use of DME or AE;Decreased knowledge of precautions;Obesity;Pain OT Treatment Interventions: Self-care/ADL training;DME and/or AE instruction;Therapeutic activities;Cognitive remediation/compensation;Patient/family education;Balance training   OT Goals Acute Rehab OT Goals OT Goal Formulation: With patient Time For Goal Achievement: 01/06/13 Potential to Achieve Goals: Good ADL Goals Pt Will Perform Grooming: with min assist;Standing at sink ADL Goal: Grooming - Progress: Goal set today Pt Will Perform Upper Body Bathing: with min assist;Sit to stand from chair;Sitting at sink ADL Goal: Upper Body Bathing - Progress: Goal set today Pt Will Perform Lower Body Bathing: with min assist;Sit to stand from chair;Sitting at sink ADL Goal: Lower Body Bathing - Progress: Goal set today Pt Will Perform Upper Body Dressing: with set-up;Sitting, chair ADL Goal: Upper Body Dressing - Progress: Goal set today Pt Will Perform Lower Body Dressing: with min assist;Sit to stand from chair ADL Goal: Lower Body Dressing - Progress: Goal set today Pt Will Transfer to Toilet: with min assist;Ambulation;3-in-1 ADL Goal: Toilet Transfer - Progress: Goal set today Pt Will Perform Toileting - Clothing Manipulation: with min assist;Sitting on 3-in-1 or toilet ADL Goal: Toileting - Clothing Manipulation - Progress: Goal set today Pt Will Perform Toileting - Hygiene: with min assist;Sit to stand from 3-in-1/toilet ADL Goal: Toileting - Hygiene - Progress: Goal set today  Visit Information  Last OT Received On: 12/23/12 Assistance Needed: +2 PT/OT Co-Evaluation/Treatment: Yes    Subjective Data  Subjective: "okay give me a minute let me think about it" Patient Stated Goal: to return to apartment   Prior  [place]  Lives With: Alone Available Help at Discharge: Personal care attendant Type of Home: Independent living facility Home Layout: One level Bathroom Shower/Tub: Multimedia programmer: Standard Home Adaptive Equipment: Wheelchair - powered;Walker - four wheeled Additional Comments: Does not walk much- using w/c mainly, goes to dining hall for all meals does not cook Prior Function Level of Independence: Independent with assistive device(s) Needs Assistance: Meal Prep;Light Housekeeping Meal Prep: Total Light Housekeeping: Total Able to Take Stairs?: No Driving: No 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 Dominant Hand: Right         Vision/Perception Vision - History Baseline Vision: Wears glasses all the time Patient Visual Report: No change from baseline   Cognition  Cognition Overall Cognitive Status: Impaired Area of Impairment: Problem solving;Other (comment) Arousal/Alertness: Awake/alert Orientation Level: Appears intact for tasks assessed Behavior During Session: [facility] for tasks performed Cognition - Other Comments: Pt with very slow processing- needs extended time to recall PTA living status    Extremity/Trunk Assessment Right Upper Extremity Assessment RUE ROM/Strength/Tone: Deficits RUE ROM/Strength/Tone Deficits: 3- out 5  RUE Coordination: WFL - gross motor Left Upper Extremity Assessment LUE ROM/Strength/Tone: Deficits LUE ROM/Strength/Tone Deficits: 3- out 5 LUE Coordination: WFL - gross motor Trunk Assessment Trunk Assessment: Kyphotic     Mobility Bed Mobility Bed Mobility: Supine to Sit;Sitting - Scoot to Edge of Bed Supine to Sit: 3: Mod assist;HOB elevated Sitting - Scoot to Edge of Bed: 3: Mod assist (with pad use) Transfers Transfers: Sit to Stand;Stand to Sit Sit to Stand: 3: Mod assist;With upper extremity assist;From bed Stand to Sit: 3: Mod assist;With upper extremity assist;To  chair/3-in-1 Details for Transfer Assistance: Pt with hip flexion and required (A) to progress to static standing. pt static standing with very flexed posture and limited ability to raise head up     Exercise     Balance     End of Session OT - End of Session Activity Tolerance: Patient tolerated treatment well Patient left: in chair;with call bell/phone within reach Nurse Communication: Mobility status;Precautions  GO     Veneda Melter 12/23/2012, 11:58 AM Pager: [phone]

## 2012-12-23 NOTE — Progress Notes (Signed)
Subjective: Pt feeling some better. Denies much pain  Objective: Physical Exam: BP 195/90  Pulse 144  Temp(Src) 98.4 F (36.9 C) (Oral)  Resp 28  Ht 5' 2" (1.575 m)  Wt 197 lb 8.5 oz (89.6 kg)  BMI 36.12 kg/m2  SpO2 95% Drain intact, site clean, NT Output bloody bilious   Labs: CBC  Recent Labs  12/22/12 1536 12/23/12 0445  WBC 15.1* 16.5*  HGB 14.9 15.0  HCT 41.8 40.9  PLT 20* 26*   BMET  Recent Labs  12/22/12 0445 12/23/12 0445  NA 144 142  K 3.0* 2.9*  CL 106 104  CO2 29 22  GLUCOSE 153* 140*  BUN 18 18  CREATININE 0.83 0.65  CALCIUM 9.0 9.7   LFT  Recent Labs  12/20/12 1706  12/23/12 0445  PROT 7.2  < > 6.7  ALBUMIN 3.3*  < > 2.5*  2.6*  AST 23  < > 76*  ALT 15  < > 93*  ALKPHOS 110  < > 347*  BILITOT 1.1  < > 3.9*  BILIDIR  --   --  2.3*  IBILI  --   --  1.6*  LIPASE 12  --   --   < > = values in this interval not displayed. PT/INR No results found for this basename: LABPROT, INR,  in the last 72 hours   Studies/Results: Ir Cholan Exist Tube  12/22/2012  *RADIOLOGY REPORT*  Clinical Data: Post cholecystostomy tube placement with persistent elevation LFTs.  Evaluate for patency of the cystic and common bile duct.  CHOLANGIOGRAM VIA EXISTING CATHETER  Comparison: Ultrasound and fluoroscopic-guided cholecystostomy tube placement - 12/21/2012; CT abdomen pelvis - 12/20/2012  Contrast:  20 ml Omnipaque-300 administered via the existing cholecystostomy tube.  Complications:  None immediate.  Technique:  A preprocedural spot fluoroscopic image of the right upper abdominal quadrant and the existing cholecystostomy tube was obtained.  Multiple spot fluoroscopic images were obtained in various obliquities following the injection of a small amount of contrast via the existing cholecystostomy tube. Additional contrast was not injected secondary to patient discomfort.  The catheter was flushed with a small amount of saline and reconnected to a gravity bag.   The patient tolerated the procedure well without immediate postprocedural complication.  Findings:  Contrast injection confirmed appropriate positioning of the cholecystostomy tube within the gallbladder fossa.  The gallbladder is decompressed.  There is minimal irregular filling of the gallbladder fundus which may be secondary to under distension though retained gallstones/gallbladder sludge are not excluded.  Despite the injection of approximately 20 ml of contrast, the cystic duct was not opacified.  Additional contrast was not injected secondary to patient discomfort.  IMPRESSION: 1.  Apparently positioned and functioning cholecystostomy tube. 2.  No opacification of the cystic or common bile ducts.  Above findings discussed with Dr. Amedeo Plenty (GI) at the time of procedure completion.   Original Report Authenticated By: Jake Seats, MD    US Abdomen Port  12/22/2012  *RADIOLOGY REPORT*  Clinical Data:  Elevated liver function tests, abdominal pain  COMPLETE ABDOMINAL ULTRASOUND  Comparison:  CT abdomen pelvis of 12/20/2012 - - - this study was performed portably  Findings:  Gallbladder:  By a history the patient has a cholecystostomy tube present and the gallbladder is contracted and does appear to contain gallbladder stones and sludge.  There is no pain over the gallbladder with compression.  Common bile duct:  There is a bandage over the right upper quadrant which  is compromises assessment of the right upper quadrant.  The best measurement of the common bile duct is 7.9 mm which is slightly prominent.  Liver:  There may be slight intrahepatic ductal dilatation.  IVC:  Appears normal.  Pancreas:  The tail of the pancreas is obscured by bowel gas.  Spleen:  The spleen is normal measuring 7.5 cm sagittally.  Right Kidney:  No hydronephrosis is seen.  The right kidney measures 11.2 cm sagittally on the best images possible.  Left Kidney:  The left kidney is somewhat obscured with the best measurement being 11.5 cm.   Abdominal aorta:  The abdominal aorta is partially obscured by bowel gas as well.  Much of this study is limited due to bowel gas and the presence of right upper quadrant bandage.  IMPRESSION:  1.  Cholecystostomy tube with decompression of the gallbladder. Gallbladder sludge is present with stones. 2.  Slightly prominent common bile duct measuring 7.9 mm maximum diameter.  Question slight intrahepatic ductal dilatation. 3.  The tail of the pancreas is obscured by bowel gas.   Original Report Authenticated By: Ivar Drape, M.D.     Assessment/Plan: Cholangitis s/p perc chole drain 4/6, injected 4/7 without visualization of ducts. Bili trending down and clinically, pt improving. Cont to follow drain    LOS: 3 days    Ascencion Dike PA-C 12/23/2012 9:31 AM

## 2012-12-23 NOTE — Progress Notes (Signed)
Subjective: Says ab pain is better, no fevers  Objective: Vital signs in last 24 hours: Temp:  [97.3 F (36.3 C)-98.8 F (37.1 C)] 98.4 F (36.9 C) (04/08 0750) Pulse Rate:  [66-144] 144 (04/08 0342) Resp:  [11-39] 28 (04/08 0750) BP: (160-216)/(68-113) 195/90 mmHg (04/08 0750) SpO2:  [93 %-95 %] 95 % (04/08 0750) Last BM Date: 12/23/12  Intake/Output from previous day: 04/07 0701 - 04/08 0700 In: 2265.5 [I.V.:1228; Blood:137.5; IV Piggyback:900] Out: 2650 [Urine:2400; Drains:250] Intake/Output this shift: Total I/O In: 50 [I.V.:50] Out: 380 [Urine:350; Drains:30]  GI: soft, mildly tender around tube  Lab Results:   Recent Labs  12/22/12 1536 12/23/12 0445  WBC 15.1* 16.5*  HGB 14.9 15.0  HCT 41.8 40.9  PLT 20* 26*   BMET  Recent Labs  12/22/12 0445 12/23/12 0445  NA 144 142  K 3.0* 2.9*  CL 106 104  CO2 29 22  GLUCOSE 153* 140*  BUN 18 18  CREATININE 0.83 0.65  CALCIUM 9.0 9.7   PT/INR No results found for this basename: LABPROT, INR,  in the last 72 hours ABG  Recent Labs  12/20/12 2111  PHART 7.440  HCO3 30.8*    Studies/Results: Ir Cholan Exist Tube  12/22/2012  *RADIOLOGY REPORT*  Clinical Data: Post cholecystostomy tube placement with persistent elevation LFTs.  Evaluate for patency of the cystic and common bile duct.  CHOLANGIOGRAM VIA EXISTING CATHETER  Comparison: Ultrasound and fluoroscopic-guided cholecystostomy tube placement - 12/21/2012; CT abdomen pelvis - 12/20/2012  Contrast:  20 ml Omnipaque-300 administered via the existing cholecystostomy tube.  Complications:  None immediate.  Technique:  A preprocedural spot fluoroscopic image of the right upper abdominal quadrant and the existing cholecystostomy tube was obtained.  Multiple spot fluoroscopic images were obtained in various obliquities following the injection of a small amount of contrast via the existing cholecystostomy tube. Additional contrast was not injected secondary to  patient discomfort.  The catheter was flushed with a small amount of saline and reconnected to a gravity bag.  The patient tolerated the procedure well without immediate postprocedural complication.  Findings:  Contrast injection confirmed appropriate positioning of the cholecystostomy tube within the gallbladder fossa.  The gallbladder is decompressed.  There is minimal irregular filling of the gallbladder fundus which may be secondary to under distension though retained gallstones/gallbladder sludge are not excluded.  Despite the injection of approximately 20 ml of contrast, the cystic duct was not opacified.  Additional contrast was not injected secondary to patient discomfort.  IMPRESSION: 1.  Apparently positioned and functioning cholecystostomy tube. 2.  No opacification of the cystic or common bile ducts.  Above findings discussed with Dr. Amedeo Plenty (GI) at the time of procedure completion.   Original Report Authenticated By: Jake Seats, MD    US Abdomen Port  12/22/2012  *RADIOLOGY REPORT*  Clinical Data:  Elevated liver function tests, abdominal pain  COMPLETE ABDOMINAL ULTRASOUND  Comparison:  CT abdomen pelvis of 12/20/2012 - - - this study was performed portably  Findings:  Gallbladder:  By a history the patient has a cholecystostomy tube present and the gallbladder is contracted and does appear to contain gallbladder stones and sludge.  There is no pain over the gallbladder with compression.  Common bile duct:  There is a bandage over the right upper quadrant which is compromises assessment of the right upper quadrant.  The best measurement of the common bile duct is 7.9 mm which is slightly prominent.  Liver:  There may be slight  intrahepatic ductal dilatation.  IVC:  Appears normal.  Pancreas:  The tail of the pancreas is obscured by bowel gas.  Spleen:  The spleen is normal measuring 7.5 cm sagittally.  Right Kidney:  No hydronephrosis is seen.  The right kidney measures 11.2 cm sagittally on the  best images possible.  Left Kidney:  The left kidney is somewhat obscured with the best measurement being 11.5 cm.  Abdominal aorta:  The abdominal aorta is partially obscured by bowel gas as well.  Much of this study is limited due to bowel gas and the presence of right upper quadrant bandage.  IMPRESSION:  1.  Cholecystostomy tube with decompression of the gallbladder. Gallbladder sludge is present with stones. 2.  Slightly prominent common bile duct measuring 7.9 mm maximum diameter.  Question slight intrahepatic ductal dilatation. 3.  The tail of the pancreas is obscured by bowel gas.   Original Report Authenticated By: Ivar Drape, M.D.     Anti-infectives: Anti-infectives   Start     Dose/Rate Route Frequency Ordered Stop   12/21/12 1200  vancomycin (VANCOCIN) 1,250 mg in sodium chloride 0.9 % 250 mL IVPB  Status:  Discontinued     1,250 mg 166.7 mL/hr over 90 Minutes Intravenous Every 24 hours 12/20/12 2322 12/21/12 0841   12/21/12 1200  vancomycin (VANCOCIN) IVPB 750 mg/150 ml premix     750 mg 150 mL/hr over 60 Minutes Intravenous Every 12 hours 12/21/12 0841     12/20/12 2359  piperacillin-tazobactam (ZOSYN) IVPB 3.375 g     3.375 g 12.5 mL/hr over 240 Minutes Intravenous Every 8 hours 12/20/12 2307     12/20/12 2359  vancomycin (VANCOCIN) 500 mg in sodium chloride 0.9 % 100 mL IVPB     500 mg 100 mL/hr over 60 Minutes Intravenous  Once 12/20/12 2322 12/21/12 0402   12/20/12 1800  vancomycin (VANCOCIN) IVPB 1000 mg/200 mL premix     1,000 mg 200 mL/hr over 60 Minutes Intravenous  Once 12/20/12 1746 12/20/12 2035   12/20/12 1800  piperacillin-tazobactam (ZOSYN) IVPB 3.375 g     3.375 g 12.5 mL/hr over 240 Minutes Intravenous  Once 12/20/12 1746 12/20/12 1841      Assessment/Plan: Likely resolving cholangitis, s/p perc chole  I still think her primary disease process was cholangitis given elevated br, fever and ruq pain.  It appears that after the amount of time that has passed  that the stone has likely passed given decrease in bilirbubin.  Drain is in gb and functional. Her abdominal exam is better.  I think reasonable to follow lfts at this point and continue abx if gi agreeable.  I think delayed cholecystectomy is likely best plan leaving drain in place.    [facility] 12/23/2012

## 2012-12-23 NOTE — Evaluation (Signed)
Physical Therapy Evaluation Patient Details Name: [name] [name] MRN: [id number] DOB: 1930/10/21 Today's Date: 12/23/2012 Time: 6962-9528 PT Time Calculation (min): 24 min  PT Assessment / Plan / Recommendation Clinical Impression  Patient is a 79 y/o female admitted with abdominal pain and AMS with h/o chronic wheelchair for mobility due to back pain.  Patient presents with decreased independence with mobility due to limited activity tolerance, decreased balance, decreased strength and acute pain and will benefit from skilled PT in the acute setting to maximize independence and hopefully allow d/c back to independence living following stay at [facility].    PT Assessment  Patient needs continued PT services    Follow Up Recommendations  SNF       Barriers to Discharge Decreased caregiver support      Equipment Recommendations  None recommended by PT       Frequency Min 3X/week    Precautions / Restrictions Precautions Precautions: Fall   Pertinent Vitals/Pain 7-8/10 in abdomen      Mobility  Bed Mobility Bed Mobility: Supine to Sit;Sitting - Scoot to Edge of Bed Supine to Sit: 3: Mod assist;HOB elevated Sitting - Scoot to Edge of Bed: 3: Mod assist (with pad use) Details for Bed Mobility Assistance: assisted with pad under pt to scoot to edge of bed Transfers Transfers: Stand Pivot Transfers Sit to Stand: 3: Mod assist;With upper extremity assist;From bed Stand to Sit: 3: Mod assist;With upper extremity assist;To chair/3-in-1 Stand Pivot Transfers: 3: Mod assist Details for Transfer Assistance: Pt with hip flexion and required (A) to progress to static standing. pt static standing with very flexed posture and limited ability to raise head up; used walker to step bed to recliner with cues for posture and steadying assist        PT Diagnosis: Difficulty walking;Generalized weakness;Acute pain  PT Problem List: Decreased strength;Decreased activity tolerance;Decreased  balance;Decreased mobility;Cardiopulmonary status limiting activity;Decreased knowledge of use of DME PT Treatment Interventions: DME instruction;Gait training;Functional mobility training;Therapeutic activities;Therapeutic exercise;Wheelchair mobility training;Patient/family education;Balance training   PT Goals Acute Rehab PT Goals PT Goal Formulation: With patient Time For Goal Achievement: 01/06/13 Potential to Achieve Goals: Fair Pt will Roll Supine to Right Side: with supervision;with rail PT Goal: Rolling Supine to Right Side - Progress: Goal set today Pt will Roll Supine to Left Side: with supervision;with rail PT Goal: Rolling Supine to Left Side - Progress: Goal set today Pt will go Supine/Side to Sit: with min assist PT Goal: Supine/Side to Sit - Progress: Goal set today Pt will go Sit to Supine/Side: with min assist PT Goal: Sit to Supine/Side - Progress: Goal set today Pt will go Sit to Stand: with supervision;with upper extremity assist PT Goal: Sit to Stand - Progress: Goal set today Pt will go Stand to Sit: with supervision;with upper extremity assist PT Goal: Stand to Sit - Progress: Goal set today Pt will Transfer Bed to Chair/Chair to Bed: with supervision PT Transfer Goal: Bed to Chair/Chair to Bed - Progress: Goal set today Pt will Propel Wheelchair: 51 - 150 feet;with supervision PT Goal: Propel Wheelchair - Progress: Goal set today  Visit Information  Last PT Received On: 12/23/12 Assistance Needed: +2 PT/OT Co-Evaluation/Treatment: Yes    Subjective Data  Subjective: It would be nice to get to the chair. Patient Stated Goal: To return to independent apartment after going to rehab   Prior [place] Lives With: Alone Available Help at Discharge: Personal care attendant Type of Home: Independent living facility  Home Layout: One level Bathroom Shower/Tub: Multimedia programmer: Standard Home Adaptive Equipment: Wheelchair -  powered;Walker - four wheeled Additional Comments: Does not walk much- using w/c mainly, goes to dining hall for all meals does not cook Prior Function Level of Independence: Independent with assistive device(s) Needs Assistance: Meal Prep;Light Housekeeping Meal Prep: Total Light Housekeeping: Total Able to Take Stairs?: No Driving: No 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 Dominant Hand: Right    Cognition  Cognition Overall Cognitive Status: Impaired Area of Impairment: Problem solving;Other (comment) Arousal/Alertness: Awake/alert Orientation Level: Appears intact for tasks assessed Behavior During Session: [facility] for tasks performed Cognition - Other Comments: Pt with very slow processing- needs extended time to recall PTA living status    Extremity/Trunk Assessment Right Upper Extremity Assessment RUE ROM/Strength/Tone: Deficits RUE ROM/Strength/Tone Deficits: 3- out 5  RUE Coordination: WFL - gross motor Left Upper Extremity Assessment LUE ROM/Strength/Tone: Deficits LUE ROM/Strength/Tone Deficits: 3- out 5 LUE Coordination: WFL - gross motor Right Lower Extremity Assessment RLE ROM/Strength/Tone: Deficits RLE ROM/Strength/Tone Deficits: unable to extend hips fully in standing, HOB raised in bed so hip extension AROM not tested; overall LE strength grossly 3+ to 4-/5 RLE Sensation: WFL - Light Touch Left Lower Extremity Assessment LLE ROM/Strength/Tone: Deficits LLE ROM/Strength/Tone Deficits: unable to extend hips fully in standing, HOB raised in bed so hip extension AROM not tested; overall LE strength grossly 3+ to 4-/5 LLE Sensation: WFL - Light Touch Trunk Assessment Trunk Assessment: Kyphotic   Balance Balance Balance Assessed: Yes Static Sitting Balance Static Sitting - Balance Support: Bilateral upper extremity supported;Feet supported Static Sitting - Level of Assistance: 5: Stand by assistance Static Sitting - Comment/# of Minutes:  sat edge of bed about 1-2 minutes prior to transfer to chair Static Standing Balance Static Standing - Balance Support: Bilateral upper extremity supported Static Standing - Level of Assistance: 3: Mod assist  End of Session PT - End of Session Equipment Utilized During Treatment: Gait belt;Oxygen Activity Tolerance: Patient limited by fatigue;Treatment limited secondary to medical complications (Comment) (HR up to 150 SVT after OOB, and BP elevated RN aware) Patient left: in chair  GP     [facility] 12/23/2012, 12:17 PM  Magda Kiel, [place] 12/23/2012

## 2012-12-23 NOTE — Progress Notes (Signed)
Asked CM about OT requests. Premature at this point esp if Pt goes to SNF. If Pt returns to independent living, 3 in 1 chair may be appropriate. Thanks!

## 2012-12-23 NOTE — Progress Notes (Addendum)
[facility] Progress Note [place] TEAM 1 - Stepdown/ICU TEAM   SHERRYLL SKOCZYLAS JOA:[id number] DOB: June 28, 1931 DOA: 12/20/2012 PCP: Mathews Argyle, MD  Brief narrative: 79 y.o. Female w/ a history of HTN; DJD s/p lumbar disk surgery 2005; Osteopenia; Dyslipidemia; Thrombocytopenia; and CAD who presented with confusion and abdominal pain x24hrs. She lives at the [facility] and is wheelchair-bound secondary to severe back pain. She was brought in to emergency department where she was found to have cholecystitis and likely sepsis with fever and tachycardia. General surgery was called but felt that she was not an operative candidate. Instead interventional radiology was consulted to place a percutaneous biliary drain.  In the emergency department patient received vancomycin and Zosyn, and it was noted that she was thrombocytopenic (is chronic) and it was decided that she would need a platelet transfusion prior to a procedure. Patient has history of coronary disease with DES stenting to her LAD 10/20/2012. She is currently on Brillinta.  Per cardiology in emergent situation Pattricia Boss will have to be held until it is deemed safe to restart it by IR after percutaneous drain has been placed.   Assessment/Plan:  Cholecystitis, acute As per Gen Surg - now s/p percutaneous drainage per IR - drain in place and draining dark fluid  Severe Sepsis WBC 34k & Temp 101.8 at presentation - met criteria for sepsis w/ known source of infection - BP stable/elevated, but altered mentation/end-organ failure therefore met criteria for severe sepsis - mentation improving, no longer febrile- d/c Vanc    ?Cholangitis  Improvement noted in total Bili today - pt continues to be in a significant amount of pain- however, ultrasound does not reveal a CBD stone- at this point, due to severe thrombocytopenia, the plan is to continue to manage conservatively- starting fat free clears today after discussing with Dr  Amedeo Plenty.   Tachycardia/SVT Likely combination of physiologic tachy (fever, sepsis) + BB withdrawal + pain (on high dose narcotic chronically) - cont IV BB - tx fever - follow on tele   Malignant HTN Likely being driven by severe pain - cont current antihypertensive and attempt to better control pain  Chronic Idiopathic Thrombocytopenia plt 42k at presentation - baseline appears to be ~40-60k - - now s/p 3 infusion of plts - follow trend - no evidence of signif bleeding at this time   Severe hypokalemia Replete via IV - follow   Chronic back pain On chronic high dose narcotics- resuming oral morphine today  CAD s/p DES to LAD x2 Feb 2014 NSTEMI 2/14 => LHC 10/20/12: dLM 20%, pLAD 99%, mLAD 60% followed by 99%, oD1 99%, and pD2 30%, mild plaque disease in the CFX and RCA. PCI: Xience Xpedition DES to the proximal and mid LAD - Effient on hold for now until IR feels is safe to resume   Code Status: FULL Family Communication: No family present at time of visit Disposition Plan: SDU  Consultants: Gen Surg IR [place] Cards  Procedures: 4/6 percutaneous choly tube per IR 4/7 injection of choly tube to assess for ductal stricture/obstruction   Antibiotics: Zosyn 4/5 >> Vanc 4/5 >>  DVT prophylaxis: SCDs   HPI/Subjective: The patient is continuing to have pain that is quite significant- was not able to sleep last night- given Ativan which caused confusion  Objective: Blood pressure 167/70, pulse 95, temperature 97.4 F (36.3 C), temperature source Oral, resp. rate 36, height _0  (1.575 m), weight 89.6 kg (197 lb 8.5 oz), SpO2 96.00%.  Intake/Output Summary (  Last 24 hours) at 12/23/12 2048 Last data filed at 12/23/12 2000  Gross per 24 hour  Intake   1610 ml  Output   1795 ml  Net   -185 ml   Exam: General: No acute respiratory distress at rest Lungs: Decreased breath sounds in bilateral bases but no focal crackles with no wheeze and good air movement throughout other  fields Cardiovascular: Tachycardic but regular without gallop or rub or appreciable murmur Abdomen: Overweight, nondistended, tender to palpation from epigastrium to right upper quadrant, no rebound, soft, bowel sounds hypoactive - perc drain present draining dark fluid Extremities: No significant cyanosis, clubbing, or edema bilateral lower extremities  Data Reviewed: Basic Metabolic Panel:  Recent Labs Lab 12/20/12 1706 12/21/12 0500 12/21/12 1818 12/22/12 0445 12/23/12 0445  NA 138 142 140 144 142  K 2.5* 2.6* 3.2* 3.0* 2.9*  CL 95* 100 101 106 104  CO2 _0 GLUCOSE 223* 152* 140* 153* 140*  BUN _1 CREATININE 0.89 1.03 0.92 0.83 0.65  CALCIUM 9.5 8.9 9.0 9.0 9.7  MG  --  1.5  --   --  2.2  PHOS  --  3.0  --   --  2.2*   Liver Function Tests:  Recent Labs Lab 12/20/12 1706 12/21/12 0500 12/22/12 0445 12/23/12 0445  AST 23 268* 103* 76*  ALT 15 220* 125* 93*  ALKPHOS 110 274* 241* 347*  BILITOT 1.1 5.7* 5.9* 3.9*  PROT 7.2 6.2 5.8* 6.7  ALBUMIN 3.3* 2.7* 2.2* 2.5*  2.6*    Recent Labs Lab 12/20/12 1706  LIPASE 12   CBC:  Recent Labs Lab 12/20/12 1706 12/21/12 0500 12/22/12 0445 12/22/12 0610 12/22/12 1536 12/23/12 0445  WBC 34.5* 22.3* 16.6* 15.8* 15.1* 16.5*  NEUTROABS 32.1*  --   --   --   --   --   HGB 18.2* 15.2* 14.2 14.6 14.9 15.0  HCT 49.4* 42.5 40.6 40.7 41.8 40.9  MCV 88.7 89.1 89.0 88.3 88.0 87.8  PLT 42* 47* QUESTIONABLE RESULTS, RECOMMEND RECOLLECT TO VERIFY 18* 20* 26*    Recent Results (from the past 240 hour(s))  CULTURE, BLOOD (ROUTINE X 2)     Status: None   Collection Time    12/20/12  5:00 PM      Result Value Range Status   Specimen Description BLOOD RIGHT ARM   Final   Special Requests BOTTLES DRAWN AEROBIC AND ANAEROBIC 10CC EA   Final   Culture  Setup Time 12/21/2012 01:54   Final   Culture     Final   Value:        BLOOD CULTURE RECEIVED NO GROWTH TO DATE CULTURE WILL BE HELD FOR 5 DAYS BEFORE  ISSUING A FINAL NEGATIVE REPORT   Report Status PENDING   Incomplete  URINE CULTURE     Status: None   Collection Time    12/20/12  5:05 PM      Result Value Range Status   Specimen Description URINE, CLEAN CATCH   Final   Special Requests NONE   Final   Culture  Setup Time 12/21/2012 02:26   Final   Colony Count NO GROWTH   Final   Culture NO GROWTH   Final   Report Status 12/22/2012 FINAL   Final  CULTURE, BLOOD (ROUTINE X 2)     Status: None   Collection Time    12/20/12  5:20 PM      Result  Value Range Status   Specimen Description BLOOD RIGHT HAND   Final   Special Requests BOTTLES DRAWN AEROBIC AND ANAEROBIC 10CC EA   Final   Culture  Setup Time 12/21/2012 01:54   Final   Culture     Final   Value:        BLOOD CULTURE RECEIVED NO GROWTH TO DATE CULTURE WILL BE HELD FOR 5 DAYS BEFORE ISSUING A FINAL NEGATIVE REPORT   Report Status PENDING   Incomplete  MRSA PCR SCREENING     Status: None   Collection Time    12/20/12 11:19 PM      Result Value Range Status   MRSA by PCR NEGATIVE  NEGATIVE Final   Comment:            The GeneXpert MRSA Assay (FDA     approved for NASAL specimens     only), is one component of a     comprehensive MRSA colonization     surveillance program. It is not     intended to diagnose MRSA     infection nor to guide or     monitor treatment for     MRSA infections.  CULTURE, ROUTINE-ABSCESS     Status: None   Collection Time    12/21/12 10:38 AM      Result Value Range Status   Specimen Description ABSCESS   Final   Special Requests DRAIN FROM CHOLECYSTOSTOMY SITE   Final   Gram Stain     Final   Value: RARE WBC NO SQUAMOUS EPITHELIAL CELLS SEEN     FEW GRAM NEGATIVE RODS     FEW GRAM POSITIVE RODS   Culture MODERATE GRAM NEGATIVE RODS   Final   Report Status PENDING   Incomplete     Studies:  Recent x-ray studies have been reviewed in detail by the Attending Physician  Scheduled Meds:  Scheduled Meds: . cloNIDine  0.1 mg  Transdermal Weekly  . hydrALAZINE  10 mg Intravenous Q6H  . metoprolol  10 mg Intravenous Q4H  . morphine  100 mg Oral BID  . piperacillin-tazobactam (ZOSYN)  IV  3.375 g Intravenous Q8H  . sodium chloride  3 mL Intravenous Q12H  . sodium chloride      . Ticagrelor  90 mg Oral BID   Continuous Infusions: . 0.9 % NaCl with KCl 20 mEq / L 50 mL/hr at 12/23/12 2000    Time spent on care of this patient: 22mns   Virgil Slinger,MD  [facility] Office  [phone] - Text Page per AShea Evansas per below:  On-Call/Text Page:      aShea [email or web address]      password [email or web address]  If 7PM-7AM, please contact night-coverage [email or web address] Password [facility] Inc4/04/2013, 8:48 PM   LOS: 3 days

## 2012-12-23 NOTE — Progress Notes (Signed)
[facility] Progress Note  Subjective: Patient states she feels a little better and her abdominal pain is somewhat better. Slightly hungry  Objective: Vital signs in last 24 hours: Temp:  [97.3 F (36.3 C)-98.8 F (37.1 C)] 98.4 F (36.9 C) (04/08 0750) Pulse Rate:  [66-144] 144 (04/08 0342) Resp:  [11-39] 28 (04/08 0750) BP: (160-216)/(68-113) 195/90 mmHg (04/08 0750) SpO2:  [93 %-95 %] 95 % (04/08 0750) Weight change:    PE: Abdomen less tender than yesterday, no Murphy's sign  Lab Results: Results for orders placed during the hospital encounter of 12/20/12 (from the past 24 hour(s))  CBC     Status: Abnormal   Collection Time    12/22/12  3:36 PM      Result Value Range   WBC 15.1 (*) 4.0 - 10.5 K/uL   RBC 4.75  3.87 - 5.11 MIL/uL   Hemoglobin 14.9  12.0 - 15.0 g/dL   HCT 41.8  36.0 - 46.0 %   MCV 88.0  78.0 - 100.0 fL   MCH 31.4  26.0 - 34.0 pg   MCHC 35.6  30.0 - 36.0 g/dL   RDW 14.1  11.5 - 15.5 %   Platelets 20 (*) 150 - 400 K/uL  HEPATIC FUNCTION PANEL     Status: Abnormal   Collection Time    12/23/12  4:45 AM      Result Value Range   Total Protein 6.7  6.0 - 8.3 g/dL   Albumin 2.5 (*) 3.5 - 5.2 g/dL   AST 76 (*) 0 - 37 U/L   ALT 93 (*) 0 - 35 U/L   Alkaline Phosphatase 347 (*) 39 - 117 U/L   Total Bilirubin 3.9 (*) 0.3 - 1.2 mg/dL   Bilirubin, Direct 2.3 (*) 0.0 - 0.3 mg/dL   Indirect Bilirubin 1.6 (*) 0.3 - 0.9 mg/dL  MAGNESIUM     Status: None   Collection Time    12/23/12  4:45 AM      Result Value Range   Magnesium 2.2  1.5 - 2.5 mg/dL  RENAL FUNCTION PANEL     Status: Abnormal   Collection Time    12/23/12  4:45 AM      Result Value Range   Sodium 142  135 - 145 mEq/L   Potassium 2.9 (*) 3.5 - 5.1 mEq/L   Chloride 104  96 - 112 mEq/L   CO2 22  19 - 32 mEq/L   Glucose, Bld 140 (*) 70 - 99 mg/dL   BUN 18  6 - 23 mg/dL   Creatinine, Ser 0.65  0.50 - 1.10 mg/dL   Calcium 9.7  8.4 - 10.5 mg/dL   Phosphorus 2.2 (*) 2.3 - 4.6 mg/dL    Albumin 2.6 (*) 3.5 - 5.2 g/dL   GFR calc non Af Amer 82 (*) >90 mL/min   GFR calc Af Amer >90  >90 mL/min  CBC     Status: Abnormal   Collection Time    12/23/12  4:45 AM      Result Value Range   WBC 16.5 (*) 4.0 - 10.5 K/uL   RBC 4.66  3.87 - 5.11 MIL/uL   Hemoglobin 15.0  12.0 - 15.0 g/dL   HCT 40.9  36.0 - 46.0 %   MCV 87.8  78.0 - 100.0 fL   MCH 32.2  26.0 - 34.0 pg   MCHC 36.7 (*) 30.0 - 36.0 g/dL   RDW 14.0  11.5 - 15.5 %   Platelets  26 (*) 150 - 400 K/uL    Studies/Results: Ir Cholan Exist Tube  12/22/2012  *RADIOLOGY REPORT*  Clinical Data: Post cholecystostomy tube placement with persistent elevation LFTs.  Evaluate for patency of the cystic and common bile duct.  CHOLANGIOGRAM VIA EXISTING CATHETER  Comparison: Ultrasound and fluoroscopic-guided cholecystostomy tube placement - 12/21/2012; CT abdomen pelvis - 12/20/2012  Contrast:  20 ml Omnipaque-300 administered via the existing cholecystostomy tube.  Complications:  None immediate.  Technique:  A preprocedural spot fluoroscopic image of the right upper abdominal quadrant and the existing cholecystostomy tube was obtained.  Multiple spot fluoroscopic images were obtained in various obliquities following the injection of a small amount of contrast via the existing cholecystostomy tube. Additional contrast was not injected secondary to patient discomfort.  The catheter was flushed with a small amount of saline and reconnected to a gravity bag.  The patient tolerated the procedure well without immediate postprocedural complication.  Findings:  Contrast injection confirmed appropriate positioning of the cholecystostomy tube within the gallbladder fossa.  The gallbladder is decompressed.  There is minimal irregular filling of the gallbladder fundus which may be secondary to under distension though retained gallstones/gallbladder sludge are not excluded.  Despite the injection of approximately 20 ml of contrast, the cystic duct was not  opacified.  Additional contrast was not injected secondary to patient discomfort.  IMPRESSION: 1.  Apparently positioned and functioning cholecystostomy tube. 2.  No opacification of the cystic or common bile ducts.  Above findings discussed with Dr. Amedeo Plenty (GI) at the time of procedure completion.   Original Report Authenticated By: Jake Seats, MD    US Abdomen Port  12/22/2012  *RADIOLOGY REPORT*  Clinical Data:  Elevated liver function tests, abdominal pain  COMPLETE ABDOMINAL ULTRASOUND  Comparison:  CT abdomen pelvis of 12/20/2012 - - - this study was performed portably  Findings:  Gallbladder:  By a history the patient has a cholecystostomy tube present and the gallbladder is contracted and does appear to contain gallbladder stones and sludge.  There is no pain over the gallbladder with compression.  Common bile duct:  There is a bandage over the right upper quadrant which is compromises assessment of the right upper quadrant.  The best measurement of the common bile duct is 7.9 mm which is slightly prominent.  Liver:  There may be slight intrahepatic ductal dilatation.  IVC:  Appears normal.  Pancreas:  The tail of the pancreas is obscured by bowel gas.  Spleen:  The spleen is normal measuring 7.5 cm sagittally.  Right Kidney:  No hydronephrosis is seen.  The right kidney measures 11.2 cm sagittally on the best images possible.  Left Kidney:  The left kidney is somewhat obscured with the best measurement being 11.5 cm.  Abdominal aorta:  The abdominal aorta is partially obscured by bowel gas as well.  Much of this study is limited due to bowel gas and the presence of right upper quadrant bandage.  IMPRESSION:  1.  Cholecystostomy tube with decompression of the gallbladder. Gallbladder sludge is present with stones. 2.  Slightly prominent common bile duct measuring 7.9 mm maximum diameter.  Question slight intrahepatic ductal dilatation. 3.  The tail of the pancreas is obscured by bowel gas.   Original  Report Authenticated By: Ivar Drape, M.D.       Assessment: Cholecystitis status post cholecystostomy, ultrasound is not strongly suggesting retained common bile duct stone, cannot rule out conclusively but doubt  overall.  Plan: Given the above improvement  as well as her severe thrombocytopenia, continue to manage expectantly from a GI standpoint. We'll continue to follow. If increasing suspicion of common bile duct stone, cholangitis, will probably order MRCP before ERCP given the thrombocytopenia.    Neeta Storey C 12/23/2012, 9:17 AM

## 2012-12-24 LAB — COMPREHENSIVE METABOLIC PANEL
ALT: 56 U/L — ABNORMAL HIGH (ref 0–35)
Alkaline Phosphatase: 295 U/L — ABNORMAL HIGH (ref 39–117)
BUN: 19 mg/dL (ref 6–23)
CO2: 26 mEq/L (ref 19–32)
GFR calc Af Amer: >90 mL/min (ref >90)
GFR calc non Af Amer: 78 mL/min — ABNORMAL LOW (ref >90)
Glucose, Bld: 118 mg/dL — ABNORMAL HIGH (ref 70–99)
Potassium: 2.7 mEq/L — CL (ref 3.5–5.1)
Sodium: 142 mEq/L (ref 135–145)

## 2012-12-24 LAB — CBC
MCHC: 35.3 g/dL (ref 30.0–36.0)
Platelets: 54 10*3/uL — ABNORMAL LOW (ref 150–400)
RDW: 14.5 % (ref 11.5–15.5)
WBC: 13.4 10*3/uL — ABNORMAL HIGH (ref 4.0–10.5)

## 2012-12-24 MED ORDER — POTASSIUM CHLORIDE CRYS ER 20 MEQ PO TBCR
40.0000 meq | EXTENDED_RELEASE_TABLET | ORAL | Status: AC
  Administered 2012-12-24 (×2): 40 meq via ORAL
  Filled 2012-12-24 (×2): qty 2

## 2012-12-24 MED ORDER — AMLODIPINE BESYLATE 2.5 MG PO TABS: 2.5000 mg | ORAL_TABLET | Freq: Every day | ORAL | Status: DC

## 2012-12-24 MED ORDER — METOPROLOL TARTRATE 50 MG PO TABS
50.0000 mg | ORAL_TABLET | Freq: Two times a day (BID) | ORAL | Status: DC
  Administered 2012-12-25 (×2): 50 mg via ORAL
  Filled 2012-12-24 (×4): qty 1

## 2012-12-24 MED ORDER — POTASSIUM CHLORIDE CRYS ER 10 MEQ PO TBCR
40.0000 meq | EXTENDED_RELEASE_TABLET | ORAL | Status: AC
  Administered 2012-12-24 (×2): 40 meq via ORAL
  Filled 2012-12-24 (×2): qty 4

## 2012-12-24 MED ORDER — AMLODIPINE BESYLATE 5 MG PO TABS
5.0000 mg | ORAL_TABLET | Freq: Every day | ORAL | Status: DC
  Administered 2012-12-25: 5 mg via ORAL
  Filled 2012-12-24: qty 1

## 2012-12-24 NOTE — Progress Notes (Signed)
[facility] Progress Note  Subjective: Abdomen feels somewhat better, tolerated clear liquid diet  Objective: Vital signs in last 24 hours: Temp:  [97.3 F (36.3 C)-97.9 F (36.6 C)] 97.6 F (36.4 C) (04/09 0800) Pulse Rate:  [68-150] 68 (04/09 0800) Resp:  [22-36] 22 (04/09 0800) BP: (122-195)/(53-98) 154/87 mmHg (04/09 0800) SpO2:  [94 %-96 %] 96 % (04/09 0800) Weight change:    PE: Abdomen less than  Lab Results: Results for orders placed during the hospital encounter of 12/20/12 (from the past 24 hour(s))  COMPREHENSIVE METABOLIC PANEL     Status: Abnormal   Collection Time    12/24/12  4:32 AM      Result Value Range   Sodium 142  135 - 145 mEq/L   Potassium 2.7 (*) 3.5 - 5.1 mEq/L   Chloride 107  96 - 112 mEq/L   CO2 26  19 - 32 mEq/L   Glucose, Bld 118 (*) 70 - 99 mg/dL   BUN 19  6 - 23 mg/dL   Creatinine, Ser 0.77  0.50 - 1.10 mg/dL   Calcium 8.6  8.4 - 10.5 mg/dL   Total Protein 5.9 (*) 6.0 - 8.3 g/dL   Albumin 2.2 (*) 3.5 - 5.2 g/dL   AST 31  0 - 37 U/L   ALT 56 (*) 0 - 35 U/L   Alkaline Phosphatase 295 (*) 39 - 117 U/L   Total Bilirubin 1.9 (*) 0.3 - 1.2 mg/dL   GFR calc non Af Amer 78 (*) >90 mL/min   GFR calc Af Amer >90  >90 mL/min  CBC     Status: Abnormal   Collection Time    12/24/12  4:32 AM      Result Value Range   WBC 13.4 (*) 4.0 - 10.5 K/uL   RBC 4.25  3.87 - 5.11 MIL/uL   Hemoglobin 13.3  12.0 - 15.0 g/dL   HCT 37.7  36.0 - 46.0 %   MCV 88.7  78.0 - 100.0 fL   MCH 31.3  26.0 - 34.0 pg   MCHC 35.3  30.0 - 36.0 g/dL   RDW 14.5  11.5 - 15.5 %   Platelets 54 (*) 150 - 400 K/uL    Studies/Results:     Assessment: Cholecystitis with probable stone in the neck of the gallbladder by CT and no filling of the cystic duct or CBD on followup cholangiogram, possible Mirizzi syndrome versus cholecystitis more likely than cholangitis as cause of improving elevation of liver function tests  Plan: Per interventional radiology and  surgery. Will follow at distance. Will advance diet to full liquids.    AESLP,NPYY C 12/24/2012, 9:20 AM

## 2012-12-24 NOTE — Progress Notes (Signed)
[facility] Progress Note [place] TEAM 1 - Stepdown/ICU TEAM   [name] [name] EAV:[id number] DOB: [birth year] DOA: 12/20/2012 PCP: Mathews Argyle, MD  Brief narrative: 79 y.o. Female w/ a history of HTN; DJD s/p lumbar disk surgery 2005; Osteopenia; Dyslipidemia; Thrombocytopenia; and CAD who presented with confusion and abdominal pain x24hrs. She lives at the [facility] and is wheelchair-bound secondary to severe back pain. She was brought in to emergency department where she was found to have cholecystitis and likely sepsis with fever and tachycardia. General surgery was called but felt that she was not an operative candidate. Instead interventional radiology was consulted to place a percutaneous biliary drain.  In the emergency department patient received vancomycin and Zosyn, and it was noted that she was thrombocytopenic (is chronic) and it was decided that she would need a platelet transfusion prior to a procedure. Patient has history of coronary disease with DES stenting to her LAD 10/20/2012. She is currently on Brillinta.  Per cardiology in emergent situation Pattricia Boss will have to be held until it is deemed safe to restart it by IR after percutaneous drain has been placed.   Assessment/Plan:  Cholecystitis, acute As per Gen Surg - now s/p percutaneous drainage per IR - drain in place and draining dark fluid - improving consistently - plan will be for cholecystectomy at a later date  Severe Sepsis - resolved WBC 34k & Temp 101.8 at presentation - met criteria for sepsis w/ known source of infection - BP stable/elevated, but altered mentation/end-organ failure therefore met criteria for severe sepsis - mentation back to baseline, no longer febrile - clinically stabilizing with resolution of sepsis   ?Cholangitis - Klebsiella per culture Ongoing improvement noted in total Bili - ultrasound did not reveal a CBD stone - the plan is to continue to manage conservatively -  advancing diet to full liquids today   Tachycardia/SVT Likely combination of physiologic tachy (fever, sepsis) + BB withdrawal + pain (on high dose narcotic chronically) - cont BB - tx fever - follow on tele - much improved as of 12/24/2012  Malignant HTN Was likely being driven by severe pain - cont current antihypertensive - transition back to oral treatment in a.m. - blood pressure much improved  Chronic Idiopathic Thrombocytopenia plt 42k at presentation - baseline appears to be ~40-60k - now s/p 3 infusions of plts - follow trend - no evidence of signif bleeding at this time - stabilizing  Severe hypokalemia Replete via IV as well as by mouth - recheck in a.m. - magnesium normalized  Chronic back pain On chronic high dose narcotics - resumed oral morphine  CAD s/p DES to LAD x2 Feb 2014 NSTEMI 2/14 => LHC 10/20/12: dLM 20%, pLAD 99%, mLAD 60% followed by 99%, oD1 99%, and pD2 30%, mild plaque disease in the CFX and RCA. PCI: Xience Xpedition DES to the proximal and mid LAD - Effient resumed 24 hours after procedure - no evidence of ischemia  Code Status: FULL Family Communication: No family present at time of visit Disposition Plan: Transfer to telemetry bed  Consultants: Gen Surg IR  Cards  Procedures: 4/6 percutaneous choly tube per IR 4/7 injection of choly tube to assess for ductal stricture/obstruction   Antibiotics: Zosyn 4/5 >> Vanc 4/5 >>4/7  DVT prophylaxis: SCDs   HPI/Subjective: The patient is much more alert and interactive today.  She reports that her pain is better controlled.  She denies nausea and is currently tolerating her full liquid diet. She denies  severe abdominal pain  Objective: Blood pressure 135/54, pulse 67, temperature 98 F (36.7 C), temperature source Oral, resp. rate 27, height _0  (1.575 m), weight 89.6 kg (197 lb 8.5 oz), SpO2 95.00%.  Intake/Output Summary (Last 24 hours) at 12/24/12 1309 Last data filed at 12/24/12 1200   Gross per 24 hour  Intake   2275 ml  Output    915 ml  Net   1360 ml   Exam: General: No acute respiratory distress at rest Lungs: no wheeze and good air movement throughout other fields Cardiovascular: Regular rate and rhythm without murmur gallop or rub appreciable Abdomen: Overweight, nondistended, tender to palpation from epigastrium to right upper quadrant but much less so compared to prior exams, no rebound, soft, bowel sounds positive - perc drain present draining dark fluid Extremities: No significant cyanosis, clubbing, or edema bilateral lower extremities  Data Reviewed: Basic Metabolic Panel:  Recent Labs Lab 12/20/12 1706 12/21/12 0500 12/21/12 1818 12/22/12 0445 12/23/12 0445 12/24/12 0432  NA 138 142 140 144 142 142  K 2.5* 2.6* 3.2* 3.0* 2.9* 2.7*  CL 95* 100 101 106 104 107  CO2 _1 GLUCOSE 223* 152* 140* 153* 140* 118*  BUN _2 CREATININE 0.89 1.03 0.92 0.83 0.65 0.77  CALCIUM 9.5 8.9 9.0 9.0 9.7 8.6  MG  --  1.5  --   --  2.2  --   PHOS  --  3.0  --   --  2.2*  --    Liver Function Tests:  Recent Labs Lab 12/20/12 1706 12/21/12 0500 12/22/12 0445 12/23/12 0445 12/24/12 0432  AST 23 268* 103* 76* 31  ALT 15 220* 125* 93* 56*  ALKPHOS 110 274* 241* 347* 295*  BILITOT 1.1 5.7* 5.9* 3.9* 1.9*  PROT 7.2 6.2 5.8* 6.7 5.9*  ALBUMIN 3.3* 2.7* 2.2* 2.5*  2.6* 2.2*    Recent Labs Lab 12/20/12 1706  LIPASE 12   CBC:  Recent Labs Lab 12/20/12 1706  12/22/12 0445 12/22/12 0610 12/22/12 1536 12/23/12 0445 12/24/12 0432  WBC 34.5*  < > 16.6* 15.8* 15.1* 16.5* 13.4*  NEUTROABS 32.1*  --   --   --   --   --   --   HGB 18.2*  < > 14.2 14.6 14.9 15.0 13.3  HCT 49.4*  < > 40.6 40.7 41.8 40.9 37.7  MCV 88.7  < > 89.0 88.3 88.0 87.8 88.7  PLT 42*  < > QUESTIONABLE RESULTS, RECOMMEND RECOLLECT TO VERIFY 18* 20* 26* 54*  < > = values in this interval not displayed.  Recent Results (from the past 240 hour(s))  CULTURE,  BLOOD (ROUTINE X 2)     Status: None   Collection Time    12/20/12  5:00 PM      Result Value Range Status   Specimen Description BLOOD RIGHT ARM   Final   Special Requests BOTTLES DRAWN AEROBIC AND ANAEROBIC 10CC EA   Final   Culture  Setup Time 12/21/2012 01:54   Final   Culture     Final   Value:        BLOOD CULTURE RECEIVED NO GROWTH TO DATE CULTURE WILL BE HELD FOR 5 DAYS BEFORE ISSUING A FINAL NEGATIVE REPORT   Report Status PENDING   Incomplete  URINE CULTURE     Status: None   Collection Time    12/20/12  5:05 PM      Result  Value Range Status   Specimen Description URINE, CLEAN CATCH   Final   Special Requests NONE   Final   Culture  Setup Time 12/21/2012 02:26   Final   Colony Count NO GROWTH   Final   Culture NO GROWTH   Final   Report Status 12/22/2012 FINAL   Final  CULTURE, BLOOD (ROUTINE X 2)     Status: None   Collection Time    12/20/12  5:20 PM      Result Value Range Status   Specimen Description BLOOD RIGHT HAND   Final   Special Requests BOTTLES DRAWN AEROBIC AND ANAEROBIC 10CC EA   Final   Culture  Setup Time 12/21/2012 01:54   Final   Culture     Final   Value:        BLOOD CULTURE RECEIVED NO GROWTH TO DATE CULTURE WILL BE HELD FOR 5 DAYS BEFORE ISSUING A FINAL NEGATIVE REPORT   Report Status PENDING   Incomplete  MRSA PCR SCREENING     Status: None   Collection Time    12/20/12 11:19 PM      Result Value Range Status   MRSA by PCR NEGATIVE  NEGATIVE Final   Comment:            The GeneXpert MRSA Assay (FDA     approved for NASAL specimens     only), is one component of a     comprehensive MRSA colonization     surveillance program. It is not     intended to diagnose MRSA     infection nor to guide or     monitor treatment for     MRSA infections.  CULTURE, ROUTINE-ABSCESS     Status: None   Collection Time    12/21/12 10:38 AM      Result Value Range Status   Specimen Description ABSCESS   Final   Special Requests DRAIN FROM CHOLECYSTOSTOMY  SITE   Final   Gram Stain     Final   Value: RARE WBC NO SQUAMOUS EPITHELIAL CELLS SEEN     FEW GRAM NEGATIVE RODS     FEW GRAM POSITIVE RODS   Culture MODERATE KLEBSIELLA PNEUMONIAE   Final   Report Status 12/24/2012 FINAL   Final   Organism ID, Bacteria KLEBSIELLA PNEUMONIAE   Final     Studies:  Recent x-ray studies have been reviewed in detail by the Attending Physician  Scheduled Meds:  Scheduled Meds: . [START ON 12/25/2012] amLODipine  5 mg Oral Daily  . hydrALAZINE  10 mg Intravenous Q6H  . metoprolol  10 mg Intravenous Q4H  . [START ON 12/25/2012] metoprolol  50 mg Oral BID  . morphine  100 mg Oral BID  . piperacillin-tazobactam (ZOSYN)  IV  3.375 g Intravenous Q8H  . potassium chloride  40 mEq Oral Q4H  . sodium chloride  3 mL Intravenous Q12H  . Ticagrelor  90 mg Oral BID   Continuous Infusions: . 0.9 % NaCl with KCl 20 mEq / L 50 mL/hr at 12/24/12 1200    Time spent on care of this patient: 51mns   MAdv[facility] LLCT,MD  [facility] Office  [phone] - Text Page per AShea Evansas per below:  On-Call/Text Page:      aShea [email or web address]      password [email or web address]  If 7PM-7AM, please contact night-coverage [email or web address] Password [email or web address] 12/24/2012, 1:09 PM   LOS: 4 days

## 2012-12-24 NOTE — Progress Notes (Signed)
Clinical Education officer, museum (CSW) observed that PT has recommended SNF for pt at discharge. 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 CSW informed pt of concerns and risks associated with her returning home with no assistance. Pt stated she understood and would therefore be agreeable to SNF placement at [facility]. CSW has left a message for [employer] to confirm a SNF bed will remain available for pt at discharge.  Hunt Oris, MSW, [place]

## 2012-12-24 NOTE — Progress Notes (Signed)
Report called to [place], receiving RN on 2000.  Pt transferred to 2033 via bed with all belongings. Called pt's son,  Zenia Resides, with new room number.    Vista Lawman, RN

## 2012-12-24 NOTE — Progress Notes (Signed)
Received from 3300. Assessed per flow sheet. Denies chest pain or shortness of breath. C/o abdominal pain 8/10. Morphine 51m IV given per order. Abdomin soft with biliary drain intact.  IV patent. Call bell near. Will monitor. BCindee Salt

## 2012-12-24 NOTE — Progress Notes (Signed)
Utilization review completed

## 2012-12-24 NOTE — Progress Notes (Signed)
Subjective: Feels better  Objective: Vital signs in last 24 hours: Temp:  [97.3 F (36.3 C)-97.9 F (36.6 C)] 97.9 F (36.6 C) (04/09 0427) Pulse Rate:  [78-150] 78 (04/09 0427) Resp:  [26-36] 28 (04/09 0427) BP: (122-195)/(53-98) 122/53 mmHg (04/09 0427) SpO2:  [94 %-96 %] 95 % (04/09 0427) Last BM Date: 12/24/12  Intake/Output from previous day: 04/08 0701 - 04/09 0700 In: 1057.6 [P.O.:457.6; I.V.:450; IV Piggyback:150] Out: 1520 [Urine:1350; Drains:170] Intake/Output this shift:    GI: soft, minimally tender around the tube, drain with bloody fluid  Lab Results:   Recent Labs  12/23/12 0445 12/24/12 0432  WBC 16.5* 13.4*  HGB 15.0 13.3  HCT 40.9 37.7  PLT 26* 54*   BMET  Recent Labs  12/23/12 0445 12/24/12 0432  NA 142 142  K 2.9* 2.7*  CL 104 107  CO2 22 26  GLUCOSE 140* 118*  BUN 18 19  CREATININE 0.65 0.77  CALCIUM 9.7 8.6   PT/INR No results found for this basename: LABPROT, INR,  in the last 72 hours ABG No results found for this basename: PHART, PCO2, PO2, HCO3,  in the last 72 hours  Studies/Results: Ir Cholan Exist Tube  12/22/2012  *RADIOLOGY REPORT*  Clinical Data: Post cholecystostomy tube placement with persistent elevation LFTs.  Evaluate for patency of the cystic and common bile duct.  CHOLANGIOGRAM VIA EXISTING CATHETER  Comparison: Ultrasound and fluoroscopic-guided cholecystostomy tube placement - 12/21/2012; CT abdomen pelvis - 12/20/2012  Contrast:  20 ml Omnipaque-300 administered via the existing cholecystostomy tube.  Complications:  None immediate.  Technique:  A preprocedural spot fluoroscopic image of the right upper abdominal quadrant and the existing cholecystostomy tube was obtained.  Multiple spot fluoroscopic images were obtained in various obliquities following the injection of a small amount of contrast via the existing cholecystostomy tube. Additional contrast was not injected secondary to patient discomfort.  The  catheter was flushed with a small amount of saline and reconnected to a gravity bag.  The patient tolerated the procedure well without immediate postprocedural complication.  Findings:  Contrast injection confirmed appropriate positioning of the cholecystostomy tube within the gallbladder fossa.  The gallbladder is decompressed.  There is minimal irregular filling of the gallbladder fundus which may be secondary to under distension though retained gallstones/gallbladder sludge are not excluded.  Despite the injection of approximately 20 ml of contrast, the cystic duct was not opacified.  Additional contrast was not injected secondary to patient discomfort.  IMPRESSION: 1.  Apparently positioned and functioning cholecystostomy tube. 2.  No opacification of the cystic or common bile ducts.  Above findings discussed with Dr. Amedeo Plenty (GI) at the time of procedure completion.   Original Report Authenticated By: Jake Seats, MD    US Abdomen Port  12/22/2012  *RADIOLOGY REPORT*  Clinical Data:  Elevated liver function tests, abdominal pain  COMPLETE ABDOMINAL ULTRASOUND  Comparison:  CT abdomen pelvis of 12/20/2012 - - - this study was performed portably  Findings:  Gallbladder:  By a history the patient has a cholecystostomy tube present and the gallbladder is contracted and does appear to contain gallbladder stones and sludge.  There is no pain over the gallbladder with compression.  Common bile duct:  There is a bandage over the right upper quadrant which is compromises assessment of the right upper quadrant.  The best measurement of the common bile duct is 7.9 mm which is slightly prominent.  Liver:  There may be slight intrahepatic ductal dilatation.  IVC:  Appears normal.  Pancreas:  The tail of the pancreas is obscured by bowel gas.  Spleen:  The spleen is normal measuring 7.5 cm sagittally.  Right Kidney:  No hydronephrosis is seen.  The right kidney measures 11.2 cm sagittally on the best images possible.  Left  Kidney:  The left kidney is somewhat obscured with the best measurement being 11.5 cm.  Abdominal aorta:  The abdominal aorta is partially obscured by bowel gas as well.  Much of this study is limited due to bowel gas and the presence of right upper quadrant bandage.  IMPRESSION:  1.  Cholecystostomy tube with decompression of the gallbladder. Gallbladder sludge is present with stones. 2.  Slightly prominent common bile duct measuring 7.9 mm maximum diameter.  Question slight intrahepatic ductal dilatation. 3.  The tail of the pancreas is obscured by bowel gas.   Original Report Authenticated By: Ivar Drape, M.D.     Anti-infectives: Anti-infectives   Start     Dose/Rate Route Frequency Ordered Stop   12/21/12 1200  vancomycin (VANCOCIN) 1,250 mg in sodium chloride 0.9 % 250 mL IVPB  Status:  Discontinued     1,250 mg 166.7 mL/hr over 90 Minutes Intravenous Every 24 hours 12/20/12 2322 12/21/12 0841   12/21/12 1200  vancomycin (VANCOCIN) IVPB 750 mg/150 ml premix  Status:  Discontinued     750 mg 150 mL/hr over 60 Minutes Intravenous Every 12 hours 12/21/12 0841 12/23/12 1250   12/20/12 2359  piperacillin-tazobactam (ZOSYN) IVPB 3.375 g     3.375 g 12.5 mL/hr over 240 Minutes Intravenous Every 8 hours 12/20/12 2307     12/20/12 2359  vancomycin (VANCOCIN) 500 mg in sodium chloride 0.9 % 100 mL IVPB     500 mg 100 mL/hr over 60 Minutes Intravenous  Once 12/20/12 2322 12/21/12 0402   12/20/12 1800  vancomycin (VANCOCIN) IVPB 1000 mg/200 mL premix     1,000 mg 200 mL/hr over 60 Minutes Intravenous  Once 12/20/12 1746 12/20/12 2035   12/20/12 1800  piperacillin-tazobactam (ZOSYN) IVPB 3.375 g     3.375 g 12.5 mL/hr over 240 Minutes Intravenous  Once 12/20/12 1746 12/20/12 1841      Assessment/Plan: Resolving cholangitis  Her labs, exam are much improved today. I think continuing abx.  Can advance diet a little also.  Will be candidate for lap chole at later  date  [facility] 12/24/2012

## 2012-12-24 NOTE — Progress Notes (Signed)
Subjective: Pt appears tired but states she feels better since GB drain placed; has some mild abd discomfort  Objective: Vital signs in last 24 hours: Temp:  [97.4 F (36.3 C)-97.9 F (36.6 C)] 97.6 F (36.4 C) (04/09 0800) Pulse Rate:  [68-150] 68 (04/09 0800) Resp:  [22-36] 22 (04/09 0800) BP: (122-188)/(53-98) 154/87 mmHg (04/09 0800) SpO2:  [94 %-96 %] 96 % (04/09 0800) Last BM Date: 12/24/12  Intake/Output from previous day: 04/08 0701 - 04/09 0700 In: 1710 [P.O.:360; I.V.:1200; IV Piggyback:150] Out: 1520 [Urine:1350; Drains:170] Intake/Output this shift: Total I/O In: 510 [P.O.:360; I.V.:150] Out: -   GB drain intact, output 170 cc's bloody bile; bile cx's - klebsiella; insertion site ok,NT; LFT's improving  Lab Results:   Recent Labs  12/23/12 0445 12/24/12 0432  WBC 16.5* 13.4*  HGB 15.0 13.3  HCT 40.9 37.7  PLT 26* 54*   BMET  Recent Labs  12/23/12 0445 12/24/12 0432  NA 142 142  K 2.9* 2.7*  CL 104 107  CO2 22 26  GLUCOSE 140* 118*  BUN 18 19  CREATININE 0.65 0.77  CALCIUM 9.7 8.6   PT/INR No results found for this basename: LABPROT, INR,  in the last 72 hours ABG No results found for this basename: PHART, PCO2, PO2, HCO3,  in the last 72 hours  Studies/Results: Ir Cholan Exist Tube  12/22/2012  *RADIOLOGY REPORT*  Clinical Data: Post cholecystostomy tube placement with persistent elevation LFTs.  Evaluate for patency of the cystic and common bile duct.  CHOLANGIOGRAM VIA EXISTING CATHETER  Comparison: Ultrasound and fluoroscopic-guided cholecystostomy tube placement - 12/21/2012; CT abdomen pelvis - 12/20/2012  Contrast:  20 ml Omnipaque-300 administered via the existing cholecystostomy tube.  Complications:  None immediate.  Technique:  A preprocedural spot fluoroscopic image of the right upper abdominal quadrant and the existing cholecystostomy tube was obtained.  Multiple spot fluoroscopic images were obtained in various obliquities  following the injection of a small amount of contrast via the existing cholecystostomy tube. Additional contrast was not injected secondary to patient discomfort.  The catheter was flushed with a small amount of saline and reconnected to a gravity bag.  The patient tolerated the procedure well without immediate postprocedural complication.  Findings:  Contrast injection confirmed appropriate positioning of the cholecystostomy tube within the gallbladder fossa.  The gallbladder is decompressed.  There is minimal irregular filling of the gallbladder fundus which may be secondary to under distension though retained gallstones/gallbladder sludge are not excluded.  Despite the injection of approximately 20 ml of contrast, the cystic duct was not opacified.  Additional contrast was not injected secondary to patient discomfort.  IMPRESSION: 1.  Apparently positioned and functioning cholecystostomy tube. 2.  No opacification of the cystic or common bile ducts.  Above findings discussed with Dr. Amedeo Plenty (GI) at the time of procedure completion.   Original Report Authenticated By: Jake Seats, MD    US Abdomen Port  12/22/2012  *RADIOLOGY REPORT*  Clinical Data:  Elevated liver function tests, abdominal pain  COMPLETE ABDOMINAL ULTRASOUND  Comparison:  CT abdomen pelvis of 12/20/2012 - - - this study was performed portably  Findings:  Gallbladder:  By a history the patient has a cholecystostomy tube present and the gallbladder is contracted and does appear to contain gallbladder stones and sludge.  There is no pain over the gallbladder with compression.  Common bile duct:  There is a bandage over the right upper quadrant which is compromises assessment of the right upper quadrant.  The best measurement of the common bile duct is 7.9 mm which is slightly prominent.  Liver:  There may be slight intrahepatic ductal dilatation.  IVC:  Appears normal.  Pancreas:  The tail of the pancreas is obscured by bowel gas.  Spleen:  The  spleen is normal measuring 7.5 cm sagittally.  Right Kidney:  No hydronephrosis is seen.  The right kidney measures 11.2 cm sagittally on the best images possible.  Left Kidney:  The left kidney is somewhat obscured with the best measurement being 11.5 cm.  Abdominal aorta:  The abdominal aorta is partially obscured by bowel gas as well.  Much of this study is limited due to bowel gas and the presence of right upper quadrant bandage.  IMPRESSION:  1.  Cholecystostomy tube with decompression of the gallbladder. Gallbladder sludge is present with stones. 2.  Slightly prominent common bile duct measuring 7.9 mm maximum diameter.  Question slight intrahepatic ductal dilatation. 3.  The tail of the pancreas is obscured by bowel gas.   Original Report Authenticated By: Ivar Drape, M.D.    Recent Results (from the past 240 hour(s))  CULTURE, BLOOD (ROUTINE X 2)     Status: None   Collection Time    12/20/12  5:00 PM      Result Value Range Status   Specimen Description BLOOD RIGHT ARM   Final   Special Requests BOTTLES DRAWN AEROBIC AND ANAEROBIC 10CC EA   Final   Culture  Setup Time 12/21/2012 01:54   Final   Culture     Final   Value:        BLOOD CULTURE RECEIVED NO GROWTH TO DATE CULTURE WILL BE HELD FOR 5 DAYS BEFORE ISSUING A FINAL NEGATIVE REPORT   Report Status PENDING   Incomplete  URINE CULTURE     Status: None   Collection Time    12/20/12  5:05 PM      Result Value Range Status   Specimen Description URINE, CLEAN CATCH   Final   Special Requests NONE   Final   Culture  Setup Time 12/21/2012 02:26   Final   Colony Count NO GROWTH   Final   Culture NO GROWTH   Final   Report Status 12/22/2012 FINAL   Final  CULTURE, BLOOD (ROUTINE X 2)     Status: None   Collection Time    12/20/12  5:20 PM      Result Value Range Status   Specimen Description BLOOD RIGHT HAND   Final   Special Requests BOTTLES DRAWN AEROBIC AND ANAEROBIC 10CC EA   Final   Culture  Setup Time 12/21/2012 01:54   Final    Culture     Final   Value:        BLOOD CULTURE RECEIVED NO GROWTH TO DATE CULTURE WILL BE HELD FOR 5 DAYS BEFORE ISSUING A FINAL NEGATIVE REPORT   Report Status PENDING   Incomplete  MRSA PCR SCREENING     Status: None   Collection Time    12/20/12 11:19 PM      Result Value Range Status   MRSA by PCR NEGATIVE  NEGATIVE Final   Comment:            The GeneXpert MRSA Assay (FDA     approved for NASAL specimens     only), is one component of a     comprehensive MRSA colonization     surveillance program. It is not  intended to diagnose MRSA     infection nor to guide or     monitor treatment for     MRSA infections.  CULTURE, ROUTINE-ABSCESS     Status: None   Collection Time    12/21/12 10:38 AM      Result Value Range Status   Specimen Description ABSCESS   Final   Special Requests DRAIN FROM CHOLECYSTOSTOMY SITE   Final   Gram Stain     Final   Value: RARE WBC NO SQUAMOUS EPITHELIAL CELLS SEEN     FEW GRAM NEGATIVE RODS     FEW GRAM POSITIVE RODS   Culture MODERATE KLEBSIELLA PNEUMONIAE   Final   Report Status 12/24/2012 FINAL   Final   Organism ID, Bacteria KLEBSIELLA PNEUMONIAE   Final    Anti-infectives: Anti-infectives   Start     Dose/Rate Route Frequency Ordered Stop   12/21/12 1200  vancomycin (VANCOCIN) 1,250 mg in sodium chloride 0.9 % 250 mL IVPB  Status:  Discontinued     1,250 mg 166.7 mL/hr over 90 Minutes Intravenous Every 24 hours 12/20/12 2322 12/21/12 0841   12/21/12 1200  vancomycin (VANCOCIN) IVPB 750 mg/150 ml premix  Status:  Discontinued     750 mg 150 mL/hr over 60 Minutes Intravenous Every 12 hours 12/21/12 0841 12/23/12 1250   12/20/12 2359  piperacillin-tazobactam (ZOSYN) IVPB 3.375 g     3.375 g 12.5 mL/hr over 240 Minutes Intravenous Every 8 hours 12/20/12 2307     12/20/12 2359  vancomycin (VANCOCIN) 500 mg in sodium chloride 0.9 % 100 mL IVPB     500 mg 100 mL/hr over 60 Minutes Intravenous  Once 12/20/12 2322 12/21/12 0402    12/20/12 1800  vancomycin (VANCOCIN) IVPB 1000 mg/200 mL premix     1,000 mg 200 mL/hr over 60 Minutes Intravenous  Once 12/20/12 1746 12/20/12 2035   12/20/12 1800  piperacillin-tazobactam (ZOSYN) IVPB 3.375 g     3.375 g 12.5 mL/hr over 240 Minutes Intravenous  Once 12/20/12 1746 12/20/12 1841      Assessment/Plan: s/p perc cholecystostomy 4/6; monitor labs; antibiotic tx/diet  per primary; replace K; cont drain NS flushes; drain will need to remain in place at least 4-6 weeks unless cholecystectomy done in interim  LOS: 4 days    ALLRED,D [facility] 12/24/2012

## 2012-12-24 NOTE — Progress Notes (Signed)
CRITICAL VALUE ALERT  Critical value received:  K+ 2.7 Date of notification:  12/24/2012  Time of notification:  0620  Critical value read back:yes  Nurse who received alert:  Roderic Ovens  MD notified (1st page): Tylene Fantasia   Time of first page:  0621  MD notified (2nd page):  Time of second page:  Responding MD:  Tylene Fantasia PA  Time MD responded:  [phone]

## 2012-12-25 DIAGNOSIS — I10 Essential (primary) hypertension: Secondary | ICD-10-CM

## 2012-12-25 LAB — CBC
HCT: 39.2 % (ref 36.0–46.0)
Hemoglobin: 13.5 g/dL (ref 12.0–15.0)
MCHC: 34.4 g/dL (ref 30.0–36.0)
MCV: 89.9 fL (ref 78.0–100.0)
WBC: 9.1 10*3/uL (ref 4.0–10.5)

## 2012-12-25 LAB — COMPREHENSIVE METABOLIC PANEL
ALT: 41 U/L — ABNORMAL HIGH (ref 0–35)
Albumin: 2.1 g/dL — ABNORMAL LOW (ref 3.5–5.2)
Calcium: 8.8 mg/dL (ref 8.4–10.5)
GFR calc Af Amer: >90 mL/min (ref >90)
Glucose, Bld: 124 mg/dL — ABNORMAL HIGH (ref 70–99)
Potassium: 4.3 mEq/L (ref 3.5–5.1)
Sodium: 139 mEq/L (ref 135–145)
Total Protein: 5.7 g/dL — ABNORMAL LOW (ref 6.0–8.3)

## 2012-12-25 LAB — CULTURE, ROUTINE-ABSCESS

## 2012-12-25 MED ORDER — AMLODIPINE BESYLATE 10 MG PO TABS
10.0000 mg | ORAL_TABLET | Freq: Every day | ORAL | Status: DC
  Administered 2012-12-26: 10 mg via ORAL
  Filled 2012-12-25: qty 1

## 2012-12-25 MED ORDER — CIPROFLOXACIN HCL 500 MG PO TABS
500.0000 mg | ORAL_TABLET | Freq: Two times a day (BID) | ORAL | Status: DC
  Administered 2012-12-25 – 2012-12-26 (×2): 500 mg via ORAL
  Filled 2012-12-25 (×4): qty 1

## 2012-12-25 MED ORDER — OXYCODONE HCL 5 MG PO TABS
5.0000 mg | ORAL_TABLET | ORAL | Status: DC | PRN
  Administered 2012-12-25 – 2012-12-26 (×5): 10 mg via ORAL
  Filled 2012-12-25 (×6): qty 2

## 2012-12-25 NOTE — Progress Notes (Signed)
Patient ID: [name] [name], female   DOB: 01-10-1931, 79 y.o.   MRN: [id number]    Subjective: Pt still has some abdominal pain, but it's improved.  She is tolerating her full liquid diet well with no increase symptoms, such as nausea or pain.  Objective: Vital signs in last 24 hours: Temp:  [97.3 F (36.3 C)-98.4 F (36.9 C)] 97.3 F (36.3 C) (04/10 0611) Pulse Rate:  [67-90] 90 (04/10 0611) Resp:  [20-27] 21 (04/10 0611) BP: (123-157)/(49-66) 157/66 mmHg (04/10 0611) SpO2:  [95 %-97 %] 96 % (04/10 0611) Last BM Date: 12/24/12  Intake/Output from previous day: 04/09 0701 - 04/10 0700 In: 2025 [P.O.:720; I.V.:1200; IV Piggyback:100] Out: 870 [Urine:825; Drains:45] Intake/Output this shift:    PE: Abd: soft, mildly tender, drain with bloody bilious output, +Bs Heart: regular Lungs: CTAB  Lab Results:   Recent Labs  12/24/12 0432 12/25/12 0440  WBC 13.4* 9.1  HGB 13.3 13.5  HCT 37.7 39.2  PLT 54* 77*   BMET  Recent Labs  12/24/12 0432 12/25/12 0440  NA 142 139  K 2.7* 4.3  CL 107 109  CO2 26 23  GLUCOSE 118* 124*  BUN 19 16  CREATININE 0.77 0.70  CALCIUM 8.6 8.8   PT/INR No results found for this basename: LABPROT, INR,  in the last 72 hours CMP     Component Value Date/Time   NA 139 12/25/2012 0440   K 4.3 12/25/2012 0440   CL 109 12/25/2012 0440   CO2 23 12/25/2012 0440   GLUCOSE 124* 12/25/2012 0440   BUN 16 12/25/2012 0440   CREATININE 0.70 12/25/2012 0440   CALCIUM 8.8 12/25/2012 0440   PROT 5.7* 12/25/2012 0440   ALBUMIN 2.1* 12/25/2012 0440   AST 23 12/25/2012 0440   ALT 41* 12/25/2012 0440   ALKPHOS 229* 12/25/2012 0440   BILITOT 1.2 12/25/2012 0440   GFRNONAA 80* 12/25/2012 0440   GFRAA >90 12/25/2012 0440   Lipase     Component Value Date/Time   LIPASE 12 12/20/2012 1706       Studies/Results: No results found.  Anti-infectives: Anti-infectives   Start     Dose/Rate Route Frequency Ordered Stop   12/21/12 1200  vancomycin (VANCOCIN)  1,250 mg in sodium chloride 0.9 % 250 mL IVPB  Status:  Discontinued     1,250 mg 166.7 mL/hr over 90 Minutes Intravenous Every 24 hours 12/20/12 2322 12/21/12 0841   12/21/12 1200  vancomycin (VANCOCIN) IVPB 750 mg/150 ml premix  Status:  Discontinued     750 mg 150 mL/hr over 60 Minutes Intravenous Every 12 hours 12/21/12 0841 12/23/12 1250   12/20/12 2359  piperacillin-tazobactam (ZOSYN) IVPB 3.375 g     3.375 g 12.5 mL/hr over 240 Minutes Intravenous Every 8 hours 12/20/12 2307     12/20/12 2359  vancomycin (VANCOCIN) 500 mg in sodium chloride 0.9 % 100 mL IVPB     500 mg 100 mL/hr over 60 Minutes Intravenous  Once 12/20/12 2322 12/21/12 0402   12/20/12 1800  vancomycin (VANCOCIN) IVPB 1000 mg/200 mL premix     1,000 mg 200 mL/hr over 60 Minutes Intravenous  Once 12/20/12 1746 12/20/12 2035   12/20/12 1800  piperacillin-tazobactam (ZOSYN) IVPB 3.375 g     3.375 g 12.5 mL/hr over 240 Minutes Intravenous  Once 12/20/12 1746 12/20/12 1841       Assessment/Plan  1. S/p percutaneous cholecystostomy drain placement 2. Possible cholangitis, improving 3. ITP  Plan: 1. Patient improving daily.  Will advance to a low fat diet. 2. Cont to follow drain. 3. May switch to oral abx therapy   LOS: 5 days    Cristan Scherzer E 12/25/2012, 8:08 AM Pager: [phone]

## 2012-12-25 NOTE — Progress Notes (Signed)
Resolving, cont drain, advance diet as tolerated

## 2012-12-25 NOTE — Progress Notes (Signed)
[facility] PROGRESS NOTE  [name] [name]:[id number] DOB: [birth year] DOA: 12/20/2012 PCP: Mathews Argyle, MD  Brief narrative:  79 y.o. Female w/ a history of HTN, DJD s/p lumbar disk surgery 2005; Osteopenia; Dyslipidemia; Thrombocytopenia; and CAD with recent NSTEMI with DES who presented with confusion and abdominal pain x24hrs. She lives at the [facility] and is wheelchair-bound secondary to severe back pain. She was brought in to emergency department where she was found to have cholecystitis and  sepsis with fever, Leucocytosi and tachycardia. General surgery was called but felt that she was not an operative candidate. Instead interventional radiology was consulted to place a percutaneous biliary drain.     Assessment/Plan:  Cholecystitis, acute  - s/p percutaneous drainage per IR. The left in place with next 4-6 weeks following which she we have cholecystectomy done. -Advanced diet to low-fat  Severe Sepsis  - resolved  Patient presented with WBC of 30 4K and temperature of 11.8 on presentation with altered mental status.  -Status back to normal and remains afebrile   Acute cholangitis - ultrasound did not reveal a CBD stone .bilirubin improving. Blood culture growing Klebsiella. Being treated with IV Zosyn (day 7)  Will switch to oral ciprofloxacin.   Tachycardia/SVT  Likely in the setting of sepsis and acute cholecystitis. Currently stable on telemetry.  Malignant HTN  Possibly related to infection and pain. Home medications resumed.  Blood pressure is still high. We'll increase the dose of amlodipine.  Chronic Idiopathic Thrombocytopenia  plt 42k at presentation - baseline appears to be ~40-60k - now s/p 3 infusions of platelets. currently stable  Severe hypokalemia  repleted and stable in a.m. Labs   Chronic back pain  On chronic high dose narcotics - resumed oral morphine   CAD s/p DES to LAD x2 Feb 2014  Patient had an STEMI in 2/14 , s/p  cardiac cath with 2 LAD stents placed. On Ticagrelor.   Code status: FULL  Family communication: None Disposition plan: skilled nursing facility as early as 4/11  Consultants Gen Surg  IR  [facility]   Procedure 4/6 percutaneous choly tube per IR  4/7 injection of choly tube to assess for ductal stricture/obstruction   Antibiotics:  Zosyn 4/5 >> 4/10 Vanc 4/5 >>4/7  cipro 4/10>>  DVT prophylaxis:  SCDs   HPI/Subjective:  Patient  seen and examined this morning. Appears fatigued but interactive.   Objective: Filed Vitals:   12/24/12 1200 12/24/12 1505 12/24/12 2105 12/25/12 0611  BP: 135/54 123/51 141/49 157/66  Pulse: 67 73 83 90  Temp: 98 F (36.7 C) 98.4 F (36.9 C) 97.3 F (36.3 C) 97.3 F (36.3 C)  TempSrc: Oral Oral Oral Oral  Resp: _0 Height:      Weight:      SpO2: 95% 97% 96% 96%    Intake/Output Summary (Last 24 hours) at 12/25/12 1448 Last data filed at 12/25/12 0700  Gross per 24 hour  Intake   1260 ml  Output    575 ml  Net    685 ml   Filed Weights   12/20/12 2300 12/20/12 2317  Weight: 91.4 kg (201 lb 8 oz) 89.6 kg (197 lb 8.5 oz)    Exam:   General:  Elderly female in no acute distress  HEENT: No pallor, no sore mucosae  Chest: Clear to auscultation bilaterally, no added sounds  CVS: Normal S1 and S2, no murmurs rub or gallop  Abdomen: Right cholecystostomy drain in place  draining dark bilious content, nondistended, tender over right upper quadrant, bowel sounds present, Foley in place  Extremities: Warm, no edema  CNS: AAOX3     Data Reviewed: Basic Metabolic Panel:  Recent Labs Lab 12/20/12 1706 12/21/12 0500 12/21/12 1818 12/22/12 0445 12/23/12 0445 12/24/12 0432 12/25/12 0440  NA 138 142 140 144 142 142 139  K 2.5* 2.6* 3.2* 3.0* 2.9* 2.7* 4.3  CL 95* 100 101 106 104 107 109  CO2 _0 GLUCOSE 223* 152* 140* 153* 140* 118* 124*  BUN _1 CREATININE 0.89 1.03  0.92 0.83 0.65 0.77 0.70  CALCIUM 9.5 8.9 9.0 9.0 9.7 8.6 8.8  MG  --  1.5  --   --  2.2  --   --   PHOS  --  3.0  --   --  2.2*  --   --    Liver Function Tests:  Recent Labs Lab 12/21/12 0500 12/22/12 0445 12/23/12 0445 12/24/12 0432 12/25/12 0440  AST 268* 103* 76* 31 23  ALT 220* 125* 93* 56* 41*  ALKPHOS 274* 241* 347* 295* 229*  BILITOT 5.7* 5.9* 3.9* 1.9* 1.2  PROT 6.2 5.8* 6.7 5.9* 5.7*  ALBUMIN 2.7* 2.2* 2.5*  2.6* 2.2* 2.1*    Recent Labs Lab 12/20/12 1706  LIPASE 12   No results found for this basename: AMMONIA,  in the last 168 hours CBC:  Recent Labs Lab 12/20/12 1706  12/22/12 0610 12/22/12 1536 12/23/12 0445 12/24/12 0432 12/25/12 0440  WBC 34.5*  < > 15.8* 15.1* 16.5* 13.4* 9.1  NEUTROABS 32.1*  --   --   --   --   --   --   HGB 18.2*  < > 14.6 14.9 15.0 13.3 13.5  HCT 49.4*  < > 40.7 41.8 40.9 37.7 39.2  MCV 88.7  < > 88.3 88.0 87.8 88.7 89.9  PLT 42*  < > 18* 20* 26* 54* 77*  < > = values in this interval not displayed. Cardiac Enzymes:  Recent Labs Lab 12/21/12 1055  TROPONINI <0.30   BNP (last 3 results) No results found for this basename: PROBNP,  in the last 8760 hours CBG: No results found for this basename: GLUCAP,  in the last 168 hours  Recent Results (from the past 240 hour(s))  CULTURE, BLOOD (ROUTINE X 2)     Status: None   Collection Time    12/20/12  5:00 PM      Result Value Range Status   Specimen Description BLOOD RIGHT ARM   Final   Special Requests BOTTLES DRAWN AEROBIC AND ANAEROBIC 10CC EA   Final   Culture  Setup Time 12/21/2012 01:54   Final   Culture     Final   Value:        BLOOD CULTURE RECEIVED NO GROWTH TO DATE CULTURE WILL BE HELD FOR 5 DAYS BEFORE ISSUING A FINAL NEGATIVE REPORT   Report Status PENDING   Incomplete  URINE CULTURE     Status: None   Collection Time    12/20/12  5:05 PM      Result Value Range Status   Specimen Description URINE, CLEAN CATCH   Final   Special Requests NONE   Final    Culture  Setup Time 12/21/2012 02:26   Final   Colony Count NO GROWTH   Final   Culture NO GROWTH   Final   Report Status  12/22/2012 FINAL   Final  CULTURE, BLOOD (ROUTINE X 2)     Status: None   Collection Time    12/20/12  5:20 PM      Result Value Range Status   Specimen Description BLOOD RIGHT HAND   Final   Special Requests BOTTLES DRAWN AEROBIC AND ANAEROBIC 10CC EA   Final   Culture  Setup Time 12/21/2012 01:54   Final   Culture     Final   Value:        BLOOD CULTURE RECEIVED NO GROWTH TO DATE CULTURE WILL BE HELD FOR 5 DAYS BEFORE ISSUING A FINAL NEGATIVE REPORT   Report Status PENDING   Incomplete  MRSA PCR SCREENING     Status: None   Collection Time    12/20/12 11:19 PM      Result Value Range Status   MRSA by PCR NEGATIVE  NEGATIVE Final   Comment:            The GeneXpert MRSA Assay (FDA     approved for NASAL specimens     only), is one component of a     comprehensive MRSA colonization     surveillance program. It is not     intended to diagnose MRSA     infection nor to guide or     monitor treatment for     MRSA infections.  CULTURE, ROUTINE-ABSCESS     Status: None   Collection Time    12/21/12 10:38 AM      Result Value Range Status   Specimen Description ABSCESS   Final   Special Requests DRAIN FROM CHOLECYSTOSTOMY SITE   Final   Gram Stain     Final   Value: RARE WBC NO SQUAMOUS EPITHELIAL CELLS SEEN     FEW GRAM NEGATIVE RODS     FEW GRAM POSITIVE RODS   Culture MODERATE KLEBSIELLA PNEUMONIAE   Final   Report Status 12/24/2012 FINAL   Final   Organism ID, Bacteria KLEBSIELLA PNEUMONIAE   Final     Studies: No results found.  Scheduled Meds: . amLODipine  5 mg Oral Daily  . metoprolol  50 mg Oral BID  . morphine  100 mg Oral BID  . piperacillin-tazobactam (ZOSYN)  IV  3.375 g Intravenous Q8H  . sodium chloride  3 mL Intravenous Q12H  . Ticagrelor  90 mg Oral BID   Continuous Infusions: . 0.9 % NaCl with KCl 20 mEq / L 50 mL/hr at  12/25/12 0753      Time spent: 25 minutes    Link Burgeson, [place]  [facility] Pager [phone]. If 7PM-7AM, please contact night-coverage at [email or web address], password [facility] 12/25/2012, 2:48 PM  LOS: 5 days

## 2012-12-25 NOTE — Progress Notes (Signed)
Subjective: Pt feeling better each day. Diet being advanced  Objective: Vital signs in last 24 hours: Temp:  [97.3 F (36.3 C)-98.4 F (36.9 C)] 97.3 F (36.3 C) (04/10 0611) Pulse Rate:  [67-90] 90 (04/10 0611) Resp:  [20-27] 21 (04/10 0611) BP: (123-157)/(49-66) 157/66 mmHg (04/10 0611) SpO2:  [95 %-97 %] 96 % (04/10 0611) Last BM Date: 12/25/12   GB drain intact, site clean, NT bloody bile;   Lab Results:   Recent Labs  12/24/12 0432 12/25/12 0440  WBC 13.4* 9.1  HGB 13.3 13.5  HCT 37.7 39.2  PLT 54* 77*   BMET  Recent Labs  12/24/12 0432 12/25/12 0440  NA 142 139  K 2.7* 4.3  CL 107 109  CO2 26 23  GLUCOSE 118* 124*  BUN 19 16  CREATININE 0.77 0.70  CALCIUM 8.6 8.8   PT/INR No results found for this basename: LABPROT, INR,  in the last 72 hours ABG No results found for this basename: PHART, PCO2, PO2, HCO3,  in the last 72 hours  Studies/Results: No results found. Recent Results (from the past 240 hour(s))  CULTURE, BLOOD (ROUTINE X 2)     Status: None   Collection Time    12/20/12  5:00 PM      Result Value Range Status   Specimen Description BLOOD RIGHT ARM   Final   Special Requests BOTTLES DRAWN AEROBIC AND ANAEROBIC 10CC EA   Final   Culture  Setup Time 12/21/2012 01:54   Final   Culture     Final   Value:        BLOOD CULTURE RECEIVED NO GROWTH TO DATE CULTURE WILL BE HELD FOR 5 DAYS BEFORE ISSUING A FINAL NEGATIVE REPORT   Report Status PENDING   Incomplete  URINE CULTURE     Status: None   Collection Time    12/20/12  5:05 PM      Result Value Range Status   Specimen Description URINE, CLEAN CATCH   Final   Special Requests NONE   Final   Culture  Setup Time 12/21/2012 02:26   Final   Colony Count NO GROWTH   Final   Culture NO GROWTH   Final   Report Status 12/22/2012 FINAL   Final  CULTURE, BLOOD (ROUTINE X 2)     Status: None   Collection Time    12/20/12  5:20 PM      Result Value Range Status   Specimen Description  BLOOD RIGHT HAND   Final   Special Requests BOTTLES DRAWN AEROBIC AND ANAEROBIC 10CC EA   Final   Culture  Setup Time 12/21/2012 01:54   Final   Culture     Final   Value:        BLOOD CULTURE RECEIVED NO GROWTH TO DATE CULTURE WILL BE HELD FOR 5 DAYS BEFORE ISSUING A FINAL NEGATIVE REPORT   Report Status PENDING   Incomplete  MRSA PCR SCREENING     Status: None   Collection Time    12/20/12 11:19 PM      Result Value Range Status   MRSA by PCR NEGATIVE  NEGATIVE Final   Comment:            The GeneXpert MRSA Assay (FDA     approved for NASAL specimens     only), is one component of a     comprehensive MRSA colonization     surveillance program. It is not     intended to  diagnose MRSA     infection nor to guide or     monitor treatment for     MRSA infections.  CULTURE, ROUTINE-ABSCESS     Status: None   Collection Time    12/21/12 10:38 AM      Result Value Range Status   Specimen Description ABSCESS   Final   Special Requests DRAIN FROM CHOLECYSTOSTOMY SITE   Final   Gram Stain     Final   Value: RARE WBC NO SQUAMOUS EPITHELIAL CELLS SEEN     FEW GRAM NEGATIVE RODS     FEW GRAM POSITIVE RODS   Culture MODERATE KLEBSIELLA PNEUMONIAE   Final   Report Status 12/24/2012 FINAL   Final   Organism ID, Bacteria KLEBSIELLA PNEUMONIAE   Final    Anti-infectives: Anti-infectives   Start     Dose/Rate Route Frequency Ordered Stop   12/21/12 1200  vancomycin (VANCOCIN) 1,250 mg in sodium chloride 0.9 % 250 mL IVPB  Status:  Discontinued     1,250 mg 166.7 mL/hr over 90 Minutes Intravenous Every 24 hours 12/20/12 2322 12/21/12 0841   12/21/12 1200  vancomycin (VANCOCIN) IVPB 750 mg/150 ml premix  Status:  Discontinued     750 mg 150 mL/hr over 60 Minutes Intravenous Every 12 hours 12/21/12 0841 12/23/12 1250   12/20/12 2359  piperacillin-tazobactam (ZOSYN) IVPB 3.375 g     3.375 g 12.5 mL/hr over 240 Minutes Intravenous Every 8 hours 12/20/12 2307     12/20/12 2359  vancomycin  (VANCOCIN) 500 mg in sodium chloride 0.9 % 100 mL IVPB     500 mg 100 mL/hr over 60 Minutes Intravenous  Once 12/20/12 2322 12/21/12 0402   12/20/12 1800  vancomycin (VANCOCIN) IVPB 1000 mg/200 mL premix     1,000 mg 200 mL/hr over 60 Minutes Intravenous  Once 12/20/12 1746 12/20/12 2035   12/20/12 1800  piperacillin-tazobactam (ZOSYN) IVPB 3.375 g     3.375 g 12.5 mL/hr over 240 Minutes Intravenous  Once 12/20/12 1746 12/20/12 1841      Assessment/Plan: s/p perc cholecystostomy 4/6;  cont drain NS flushes WBC normal, Bili normal drain will need to remain in place at least 4-6 weeks unless cholecystectomy done in interim. Call IR with drain issues  LOS: 5 days    Ascencion Dike 12/25/2012

## 2012-12-26 DIAGNOSIS — R6520 Severe sepsis without septic shock: Secondary | ICD-10-CM

## 2012-12-26 DIAGNOSIS — I10 Essential (primary) hypertension: Secondary | ICD-10-CM

## 2012-12-26 DIAGNOSIS — A419 Sepsis, unspecified organism: Secondary | ICD-10-CM | POA: Diagnosis present

## 2012-12-26 MED ORDER — CIPROFLOXACIN HCL 500 MG PO TABS: 500.0000 mg | ORAL_TABLET | Freq: Two times a day (BID) | ORAL | Status: DC

## 2012-12-26 MED ORDER — METOPROLOL TARTRATE 100 MG PO TABS
100.0000 mg | ORAL_TABLET | Freq: Two times a day (BID) | ORAL | Status: DC
  Administered 2012-12-26: 100 mg via ORAL
  Filled 2012-12-26 (×2): qty 1

## 2012-12-26 MED ORDER — METOPROLOL TARTRATE 50 MG PO TABS: 100.0000 mg | ORAL_TABLET | Freq: Two times a day (BID) | ORAL | Status: DC

## 2012-12-26 MED ORDER — AMLODIPINE BESYLATE 2.5 MG PO TABS: 10.0000 mg | ORAL_TABLET | Freq: Every day | ORAL | Status: DC

## 2012-12-26 NOTE — Discharge Summary (Signed)
Physician Discharge Summary  [name] [name]:[id number] DOB: Oct 01, 1930 DOA: 12/20/2012  PCP: Mathews Argyle, MD  Admit date: 12/20/2012 Discharge date: 12/26/2012  Time spent: 40 minutes  Recommendations for Outpatient Follow-up:  1. D/c to SNF with outpt PCP and surgery follow up.check LFTs in 1 week  Discharge Diagnoses:  Principal Problem:   Severe Sepsis with klebsiella bacteremia  Active Problems:   Cholecystitis, acute   HTN (hypertension)   Chronic back pain   Thrombocytopenia   CAD (coronary artery disease) of artery bypass graft   Hypokalemia    Discharge Condition: fair  Diet recommendation: low fat diet  Filed Weights   12/20/12 2300 12/20/12 2317  Weight: 91.4 kg (201 lb 8 oz) 89.6 kg (197 lb 8.5 oz)    History of present illness:  79 y.o. Female w/ a history of HTN, DJD s/p lumbar disk surgery 2005; Osteopenia; Dyslipidemia; Thrombocytopenia; and CAD with recent NSTEMI with DES who presented with confusion and abdominal pain x24hrs. She lives at the [facility] and is wheelchair-bound secondary to severe back pain. She was brought in to emergency department where she was found to have cholecystitis and sepsis with fever, Leucocytosi and tachycardia. General surgery was called but felt that she was not an operative candidate. Instead interventional radiology was consulted to place a percutaneous biliary drain.    Hospital Course:   Severe Sepsis  - resolved  Patient presented with WBC of 30 4K and temperature of 101.8 on presentation with altered mental status and hypotension meeting criteria for severe sepsis.  -Status back to normal and remains afebrile . Leucocytosis resolved.  Cholecystitis, acute  - s/p percutaneous drainage per IR. recommend it to be left in place for next  4-6 weeks following which she will we have cholecystectomy done.  -Advanced diet to low-fat   Acute cholangitis  - ultrasound did not reveal a CBD stone .bilirubin  improving. Blood culture grew  Klebsiella. treated with IV Zosyn (day 7) switched to oral cipro to complete a 14 day course.  Tachycardia/SVT  Likely in the setting of sepsis and acute cholecystitis. Currently stable on telemetry.   Malignant HTN  Possibly related to infection and pain. Home medications resumed. Blood pressure is still high. increased the dose of amlodipine and metoprolol.   Chronic Idiopathic Thrombocytopenia  plt 42k at presentation - baseline appears to be ~40-60k - now s/p 3 infusions of platelets. currently stable   Severe hypokalemia  repleted and stable in a.m. Labs   Chronic back pain  On chronic high dose narcotics - resumed home meds  CAD s/p DES to LAD x2 Feb 2014  Patient had an STEMI in 2/14 , s/p cardiac cath with 2 LAD stents placed. On Ticagrelor.    Patient seen by PT and due to weakness acute illness recommended SNF. Will be discharged back to [facility] home with PT. She will follow up with [place] surgery in 1-2 weeks. Needs monitoring of the drain closely.the  drain will need to remain in place at least 4-6 weeks unless cholecystectomy done.  Code status: FULL   Consultants  Gen Surg  IR  [facility] Cards   Procedure  4/6 percutaneous choly tube per IR  4/7 injection of choly tube to assess for ductal stricture/obstruction   Antibiotics:  Zosyn 4/5 >> 4/10  Vanc 4/5 >>4/7  cipro 4/10>> until 4/17        Discharge Exam: Filed Vitals:   12/25/12 0611 12/25/12 1502 12/25/12 2036 12/26/12 0546  BP:  157/66 147/61 145/75 174/78  Pulse: 90 85 98 82  Temp: 97.3 F (36.3 C) 97.4 F (36.3 C) 97 F (36.1 C) 97.9 F (36.6 C)  TempSrc: Oral Oral Oral Oral  Resp: _0 Height:      Weight:      SpO2: 96% 96% 98% 96%    General: Elderly female in no acute distress  HEENT: No pallor, no sore mucosae  Chest: Clear to auscultation bilaterally, no added sounds  CVS: Normal S1 and S2, no murmurs rub or gallop  Abdomen: Right  cholecystostomy drain in place draining dark bilious content, nondistended, tender over right upper quadrant, bowel sounds present,   Extremities: Warm, no edema  CNS: AAOX3    Discharge Instructions     Medication List    TAKE these medications       acidophilus Caps  Take 1 capsule by mouth daily.     amLODipine 2.5 MG tablet  Commonly known as:  NORVASC  Take 4 tablets (10 mg total) by mouth daily.     anti-nausea solution  Take 15 mLs by mouth every 15 (fifteen) minutes as needed for nausea.     aspirin 81 MG tablet  Take 81 mg by mouth daily.     atorvastatin 80 MG tablet  Commonly known as:  LIPITOR  Take 80 mg by mouth daily. RX is written for 6pm. Nursing Home administers at [place] 600/D PO  Take 2 tablets by mouth daily.     ciprofloxacin 500 MG tablet  Commonly known as:  CIPRO  Take 1 tablet (500 mg total) by mouth 2 (two) times daily.     fluticasone 50 MCG/ACT nasal spray  Commonly known as:  FLONASE  Place 2 sprays into the nose daily.     furosemide 80 MG tablet  Commonly known as:  LASIX  Take 80 mg by mouth 2 (two) times daily.     gabapentin 800 MG tablet  Commonly known as:  NEURONTIN  Take 800 mg by mouth 4 (four) times daily.     glucosamine-chondroitin 500-400 MG tablet  Take 2 tablets by mouth daily.     metoprolol 50 MG tablet  Commonly known as:  LOPRESSOR  Take 2 tablets (100 mg total) by mouth 2 (two) times daily. Also with 25 mg tab to = 75 mg twice daily     morphine 100 MG 12 hr tablet  Commonly known as:  MS CONTIN  Take 100 mg by mouth 2 (two) times daily.     multivitamin with minerals Tabs  Take 1 tablet by mouth daily.     oxyCODONE 15 MG immediate release tablet  Commonly known as:  ROXICODONE  Take 15 mg by mouth every 4 (four) hours as needed. For pain .   Max 8 tabs in 24 hrs     potassium chloride 10 MEQ tablet  Commonly known as:  K-DUR  Take 10 mEq by mouth 2 (two) times daily.     promethazine  12.5 MG tablet  Commonly known as:  PHENERGAN  Take 12.5 mg by mouth every 6 (six) hours as needed for nausea.     saccharomyces boulardii 250 MG capsule  Commonly known as:  FLORASTOR  Take 250 mg by mouth 2 (two) times daily.     Ticagrelor 90 MG Tabs tablet  Commonly known as:  BRILINTA  Take 1 tablet (90 mg total) by mouth 2 (two) times daily.  vitamin B-12 1000 MCG tablet  Commonly known as:  CYANOCOBALAMIN  Take 1,000 mcg by mouth daily.     vitamin C 500 MG tablet  Commonly known as:  ASCORBIC ACID  Take 500 mg by mouth daily.           Follow-up Information   Follow up with Mathews Argyle, MD In 1 week.   Contact information:   [address] [place] [address] [phone]       Follow up with [place]. Call in 2 weeks.   Contact information:   [place] [phone] [phone]       The results of significant diagnostics from this hospitalization (including imaging, microbiology, ancillary and laboratory) are listed below for reference.    Significant Diagnostic Studies: Ct Abdomen Pelvis W Contrast  12/20/2012  *RADIOLOGY REPORT*  Clinical Data: Abdominal pain.  Vomiting.  History of hypertension.  CT ABDOMEN AND PELVIS WITH CONTRAST  Technique:  Multidetector CT imaging of the abdomen and pelvis was performed following the standard protocol during bolus administration of intravenous contrast.  Contrast: 187m OMNIPAQUE IOHEXOL 300 MG/ML  SOLN CT of the chest 10/17/2012  Comparison: CT of the chest 10/17/2012  Findings: Lung bases show posterior consolidations bilaterally, associated with air bronchograms and small effusions.  The heart is mildly enlarged.  Coronary stent is identified.  There is significant artifact from spinal stimulator.  There is a small amount of ascites surrounding the liver. Periportal edema is noted. There is marked enlargement of the gallbladder.  Gallbladder  measures greater than 12.0 x 5.4 cm. Dependent layering of high attenuation material within the gallbladder is consistent with small stones or layering sludge. Within the region of the gallbladder neck, there is a high attenuation structure which measures 1.7 cm.  This is less apparent on the delayed images.  It is not clear whether this represents a gallstone or lymph node in the porta hepatis.  This is best seen on image number 28.  There is pericholecystic fluid or gallbladder wall thickening.  No focal abnormality identified within the spleen.  There is a left renal cysts.  There are patchy areas of renal parenchymal thinning bilaterally, left greater than right.  No focal abnormality identified within the pancreas.  The stomach and small bowel loops are normal in appearance. Colonic loops are normal in caliber and wall thickness.  There is moderate stool within the rectosigmoid colon. Small umbilical hernia contains only mesenteric fat.  The urinary bladder is markedly distended.  The uterus is present. No evidence for adnexal mass or free pelvic fluid. No evidence for aortic aneurysm.  Degenerative changes are seen throughout the lower thoracic and lumbar spine.  Patient has had multilevel posterior fusion, creating significant artifact. Prior right hip  IMPRESSION:  1.  Bilateral lower lobe infiltrates and small effusions. 2.  Coronary stent. 3.  Small amount of ascites.  4.  Distended gallbladder with pericholecystic fluid or gallbladder wall thickening. 5.  Suspect a layering gallstones or debris/sludge. 6.  Marked distension of the urinary bladder. 7.  Small umbilical hernia, containing mesenteric fat.  The findings were discussed with HJarrett SohoMuthersbaugh on 12/20/2012 at 7:30 p.m.   Original Report Authenticated By: ENolon Nations M.D.    Ir Cholan Exist Tube  12/22/2012  *RADIOLOGY REPORT*  Clinical Data: Post cholecystostomy tube placement with persistent elevation LFTs.  Evaluate for patency of the  cystic and common bile duct.  CHOLANGIOGRAM  VIA EXISTING CATHETER  Comparison: Ultrasound and fluoroscopic-guided cholecystostomy tube placement - 12/21/2012; CT abdomen pelvis - 12/20/2012  Contrast:  20 ml Omnipaque-300 administered via the existing cholecystostomy tube.  Complications:  None immediate.  Technique:  A preprocedural spot fluoroscopic image of the right upper abdominal quadrant and the existing cholecystostomy tube was obtained.  Multiple spot fluoroscopic images were obtained in various obliquities following the injection of a small amount of contrast via the existing cholecystostomy tube. Additional contrast was not injected secondary to patient discomfort.  The catheter was flushed with a small amount of saline and reconnected to a gravity bag.  The patient tolerated the procedure well without immediate postprocedural complication.  Findings:  Contrast injection confirmed appropriate positioning of the cholecystostomy tube within the gallbladder fossa.  The gallbladder is decompressed.  There is minimal irregular filling of the gallbladder fundus which may be secondary to under distension though retained gallstones/gallbladder sludge are not excluded.  Despite the injection of approximately 20 ml of contrast, the cystic duct was not opacified.  Additional contrast was not injected secondary to patient discomfort.  IMPRESSION: 1.  Apparently positioned and functioning cholecystostomy tube. 2.  No opacification of the cystic or common bile ducts.  Above findings discussed with Dr. Amedeo Plenty (GI) at the time of procedure completion.   Original Report Authenticated By: Jake Seats, MD    US Abdomen Port  12/22/2012  *RADIOLOGY REPORT*  Clinical Data:  Elevated liver function tests, abdominal pain  COMPLETE ABDOMINAL ULTRASOUND  Comparison:  CT abdomen pelvis of 12/20/2012 - - - this study was performed portably  Findings:  Gallbladder:  By a history the patient has a cholecystostomy tube present and  the gallbladder is contracted and does appear to contain gallbladder stones and sludge.  There is no pain over the gallbladder with compression.  Common bile duct:  There is a bandage over the right upper quadrant which is compromises assessment of the right upper quadrant.  The best measurement of the common bile duct is 7.9 mm which is slightly prominent.  Liver:  There may be slight intrahepatic ductal dilatation.  IVC:  Appears normal.  Pancreas:  The tail of the pancreas is obscured by bowel gas.  Spleen:  The spleen is normal measuring 7.5 cm sagittally.  Right Kidney:  No hydronephrosis is seen.  The right kidney measures 11.2 cm sagittally on the best images possible.  Left Kidney:  The left kidney is somewhat obscured with the best measurement being 11.5 cm.  Abdominal aorta:  The abdominal aorta is partially obscured by bowel gas as well.  Much of this study is limited due to bowel gas and the presence of right upper quadrant bandage.  IMPRESSION:  1.  Cholecystostomy tube with decompression of the gallbladder. Gallbladder sludge is present with stones. 2.  Slightly prominent common bile duct measuring 7.9 mm maximum diameter.  Question slight intrahepatic ductal dilatation. 3.  The tail of the pancreas is obscured by bowel gas.   Original Report Authenticated By: Ivar Drape, M.D.    Ir Perc Cholecystostomy  12/21/2012  *RADIOLOGY REPORT*  Clinical Data:Distended thick walled gallbladder with layering stones and surrounding inflammatory/edematous changes on CT. Elevated white blood cell count, fever.  PERCUTANEOUS CHOLECYSTOSTOMY TUBE PLACEMENT WITH ULTRASOUND AND FLUOROSCOPIC GUIDANCE:  Technique: The procedure, risks (including but not limited to bleeding, infection, organ damage), benefits, and alternatives were explained to the family.  Questions regarding the procedure were encouraged and answered.  The family understands and consents  to the procedure.Survey ultrasound of the abdomen was performed  and an appropriate skin entry site was identified. Skin site was marked, prepped with Betadine, and draped in usual sterile fashion, and infiltrated locally with 1% lidocaine.  Intravenous Fentanyl and Versed were administered as conscious sedation during continuous cardiorespiratory monitoring by the radiology RN, with a total moderate sedation time of 10 minutes.   Under real-time ultrasound guidance, gallbladder was accessed using a transhepatic approach with a 21-gauge needle. Ultrasound image documentation was saved. Blood tinged bile returned through the hub. Needle was exchanged over a 018 guidewire for transitional dilator which allowed placement of 035 J wire. Over this, a 10.2 French pigtail catheter was advanced and formed centrally in the gallbladder lumen. Small contrast injection confirmed appropriate position. Catheter secured externally with 0 Prolene suture and placed external drain bag. Patient tolerated the procedure well, with no immediate complication. 20 ml of bloody bile were aspirated, a sample sent for routine abscess culture.  IMPRESSION:  1. Technically successful percutaneous cholecystostomy tube placement with ultrasound and fluoroscopic guidance.   Original Report Authenticated By: D. Wallace Going, MD    Dg Chest Portable 1 View  12/20/2012  *RADIOLOGY REPORT*  Clinical Data: Tachycardia.  PORTABLE CHEST - 1 VIEW  Comparison: 10/17/2012  Findings: Lung volumes are extremely low with bibasilar atelectasis present.  No overt edema or consolidation.  No pleural fluid identified.  Stable appearance of the thoracic spine stimulator device.  Stable advanced degenerative changes of the left glenohumeral joint.  IMPRESSION: Low volumes with bibasilar atelectasis.   Original Report Authenticated By: Aletta Edouard, M.D.     Microbiology: Recent Results (from the past 240 hour(s))  CULTURE, BLOOD (ROUTINE X 2)     Status: None   Collection Time    12/20/12  5:00 PM      Result Value  Range Status   Specimen Description BLOOD RIGHT ARM   Final   Special Requests BOTTLES DRAWN AEROBIC AND ANAEROBIC 10CC EA   Final   Culture  Setup Time 12/21/2012 01:54   Final   Culture     Final   Value:        BLOOD CULTURE RECEIVED NO GROWTH TO DATE CULTURE WILL BE HELD FOR 5 DAYS BEFORE ISSUING A FINAL NEGATIVE REPORT   Report Status PENDING   Incomplete  URINE CULTURE     Status: None   Collection Time    12/20/12  5:05 PM      Result Value Range Status   Specimen Description URINE, CLEAN CATCH   Final   Special Requests NONE   Final   Culture  Setup Time 12/21/2012 02:26   Final   Colony Count NO GROWTH   Final   Culture NO GROWTH   Final   Report Status 12/22/2012 FINAL   Final  CULTURE, BLOOD (ROUTINE X 2)     Status: None   Collection Time    12/20/12  5:20 PM      Result Value Range Status   Specimen Description BLOOD RIGHT HAND   Final   Special Requests BOTTLES DRAWN AEROBIC AND ANAEROBIC 10CC EA   Final   Culture  Setup Time 12/21/2012 01:54   Final   Culture     Final   Value:        BLOOD CULTURE RECEIVED NO GROWTH TO DATE CULTURE WILL BE HELD FOR 5 DAYS BEFORE ISSUING A FINAL NEGATIVE REPORT   Report Status PENDING   Incomplete  MRSA  PCR SCREENING     Status: None   Collection Time    12/20/12 11:19 PM      Result Value Range Status   MRSA by PCR NEGATIVE  NEGATIVE Final   Comment:            The GeneXpert MRSA Assay (FDA     approved for NASAL specimens     only), is one component of a     comprehensive MRSA colonization     surveillance program. It is not     intended to diagnose MRSA     infection nor to guide or     monitor treatment for     MRSA infections.  CULTURE, ROUTINE-ABSCESS     Status: None   Collection Time    12/21/12 10:38 AM      Result Value Range Status   Specimen Description ABSCESS   Final   Special Requests DRAIN FROM CHOLECYSTOSTOMY SITE   Final   Gram Stain     Final   Value: RARE WBC NO SQUAMOUS EPITHELIAL CELLS SEEN      FEW GRAM NEGATIVE RODS     FEW GRAM POSITIVE RODS   Culture MODERATE KLEBSIELLA PNEUMONIAE   Final   Report Status 12/24/2012 FINAL   Final   Organism ID, Bacteria KLEBSIELLA PNEUMONIAE   Final     Labs: Basic Metabolic Panel:  Recent Labs Lab 12/20/12 1706 12/21/12 0500 12/21/12 1818 12/22/12 0445 12/23/12 0445 12/24/12 0432 12/25/12 0440  NA 138 142 140 144 142 142 139  K 2.5* 2.6* 3.2* 3.0* 2.9* 2.7* 4.3  CL 95* 100 101 106 104 107 109  CO2 _0 GLUCOSE 223* 152* 140* 153* 140* 118* 124*  BUN _1 CREATININE 0.89 1.03 0.92 0.83 0.65 0.77 0.70  CALCIUM 9.5 8.9 9.0 9.0 9.7 8.6 8.8  MG  --  1.5  --   --  2.2  --   --   PHOS  --  3.0  --   --  2.2*  --   --    Liver Function Tests:  Recent Labs Lab 12/21/12 0500 12/22/12 0445 12/23/12 0445 12/24/12 0432 12/25/12 0440  AST 268* 103* 76* 31 23  ALT 220* 125* 93* 56* 41*  ALKPHOS 274* 241* 347* 295* 229*  BILITOT 5.7* 5.9* 3.9* 1.9* 1.2  PROT 6.2 5.8* 6.7 5.9* 5.7*  ALBUMIN 2.7* 2.2* 2.5*  2.6* 2.2* 2.1*    Recent Labs Lab 12/20/12 1706  LIPASE 12   No results found for this basename: AMMONIA,  in the last 168 hours CBC:  Recent Labs Lab 12/20/12 1706  12/22/12 0610 12/22/12 1536 12/23/12 0445 12/24/12 0432 12/25/12 0440  WBC 34.5*  < > 15.8* 15.1* 16.5* 13.4* 9.1  NEUTROABS 32.1*  --   --   --   --   --   --   HGB 18.2*  < > 14.6 14.9 15.0 13.3 13.5  HCT 49.4*  < > 40.7 41.8 40.9 37.7 39.2  MCV 88.7  < > 88.3 88.0 87.8 88.7 89.9  PLT 42*  < > 18* 20* 26* 54* 77*  < > = values in this interval not displayed. Cardiac Enzymes:  Recent Labs Lab 12/21/12 1055  TROPONINI <0.30   BNP: BNP (last 3 results) No results found for this basename: PROBNP,  in the last 8760 hours CBG: No results found for this basename: GLUCAP,  in  the last 168 hours     Signed:  Louellen Molder  [facility] 12/26/2012, 7:18 AM

## 2012-12-26 NOTE — Clinical Social Work Note (Signed)
Clinical Social Worker facilitated patient discharge including contacting patient and facility to confirm patient discharge plans.  Patient and facility agreeable to transport via ambulance today.  CSW arranged ambulance transport via [facility] to [facility].  RN called report to facility.  Clinical Social Worker will sign off for now as social work intervention is no longer needed. Please consult [place] again if new need arises.  Barbette Or, [place]

## 2012-12-26 NOTE — Care Management Note (Signed)
    Page 1 of 1   12/26/2012     4:19:42 PM   CARE MANAGEMENT NOTE 12/26/2012  Patient:  [name] [name], [name] [name]   Account Number:  [id number]  Date Initiated:  12/24/2012  Documentation initiated by:  Marvetta Gibbons  Subjective/Objective Assessment:   Pt admitted with cholecystitis, s/p percutaneous biliary drain placement     Action/Plan:   PTA pt lived at [place] at [place], PT/OT evals- CSW following for potential SNF placement   Anticipated DC Date:  12/26/2012   Anticipated DC Plan:  [place]  In-house referral  Clinical Social Worker      DC Planning Services  CM consult      Choice offered to / List presented to:             Status of service:  Completed, signed off Medicare Important Message given?   (If response is "NO", the following Medicare IM given date fields will be blank) Date Medicare IM given:   Date Additional Medicare IM given:    Discharge Disposition:  [place]  Per UR Regulation:  Reviewed for med. necessity/level of care/duration of stay  If discussed at [place] of Stay Meetings, dates discussed:    Comments:  12/26/12 Almyra Free Jp [place],RN,BSN [phone] PT DISCHARGED TO SNF TODAY, PER CSW ARRANGEMENTS.  12/24/12- 1400- Marvetta Gibbons RN, BSN [phone] Pt to advance diet today to full liquids percutaneous drain in place- per PT pt needs SNF level- CSW following for potential SNF placement

## 2012-12-26 NOTE — Progress Notes (Signed)
Report called and given to [place] RN @ [place].

## 2012-12-27 LAB — CULTURE, BLOOD (ROUTINE X 2): Culture: NO GROWTH

## 2013-01-20 ENCOUNTER — Encounter (INDEPENDENT_AMBULATORY_CARE_PROVIDER_SITE_OTHER): Payer: Medicare Other | Admitting: General Surgery

## 2013-01-26 ENCOUNTER — Telehealth (INDEPENDENT_AMBULATORY_CARE_PROVIDER_SITE_OTHER): Payer: Self-pay | Admitting: General Surgery

## 2013-01-26 ENCOUNTER — Other Ambulatory Visit (INDEPENDENT_AMBULATORY_CARE_PROVIDER_SITE_OTHER): Payer: Self-pay

## 2013-01-26 DIAGNOSIS — K810 Acute cholecystitis: Secondary | ICD-10-CM

## 2013-01-26 NOTE — Telephone Encounter (Signed)
[name] [name] at [facility] is aware of appt at [facility]  02/03/13 930AM and office visit  02/10/13 1130

## 2013-02-02 ENCOUNTER — Encounter (HOSPITAL_COMMUNITY): Payer: Self-pay | Admitting: Pharmacy Technician

## 2013-02-03 ENCOUNTER — Ambulatory Visit (HOSPITAL_COMMUNITY)
Admission: RE | Admit: 2013-02-03 | Discharge: 2013-02-03 | Disposition: A | Discharge status: Home / Self Care | Payer: Medicare Other | Source: Ambulatory Visit | Attending: General Surgery | Admitting: General Surgery

## 2013-02-03 ENCOUNTER — Telehealth (INDEPENDENT_AMBULATORY_CARE_PROVIDER_SITE_OTHER): Payer: Self-pay

## 2013-02-03 DIAGNOSIS — T85698A Other mechanical complication of other specified internal prosthetic devices, implants and grafts, initial encounter: Secondary | ICD-10-CM | POA: Insufficient documentation

## 2013-02-03 DIAGNOSIS — Y833 Surgical operation with formation of external stoma as the cause of abnormal reaction of the patient, or of later complication, without mention of misadventure at the time of the procedure: Secondary | ICD-10-CM | POA: Insufficient documentation

## 2013-02-03 DIAGNOSIS — X58XXXA Exposure to other specified factors, initial encounter: Secondary | ICD-10-CM | POA: Insufficient documentation

## 2013-02-03 DIAGNOSIS — K810 Acute cholecystitis: Secondary | ICD-10-CM

## 2013-02-03 MED ORDER — IOHEXOL 300 MG/ML  SOLN
50.0000 mL | Freq: Once | INTRAMUSCULAR | Status: AC | PRN
  Administered 2013-02-03: 20 mL

## 2013-02-03 NOTE — Telephone Encounter (Signed)
Nurse from [facility] calling to report that pt had her gallbladder drain removed today in radiology by the radiologist. The pt will keep her appt next week with Dr Hulen Skains.

## 2013-02-04 ENCOUNTER — Telehealth (HOSPITAL_COMMUNITY): Payer: Self-pay | Admitting: *Deleted

## 2013-02-10 ENCOUNTER — Ambulatory Visit (INDEPENDENT_AMBULATORY_CARE_PROVIDER_SITE_OTHER): Payer: Medicare Other | Admitting: General Surgery

## 2013-02-10 ENCOUNTER — Encounter (INDEPENDENT_AMBULATORY_CARE_PROVIDER_SITE_OTHER): Payer: Self-pay | Admitting: General Surgery

## 2013-02-10 VITALS — BP 110/62 | HR 64 | Temp 97.3°F | Resp 12 | Ht 62.0 in | Wt 200.0 lb

## 2013-02-10 DIAGNOSIS — K810 Acute cholecystitis: Secondary | ICD-10-CM

## 2013-02-10 NOTE — Progress Notes (Signed)
The patient is status post percutaneous gallbladder drain for presumed acute cholecystitis on the previous hospitalization when she had symptoms of coronary artery disease and a non-STEMI.  A gallbladder drain was removed approximately 8 days ago but it was noted at that time with injection that it had probably been out for a while. She is currently asymptomatic. She is eating well without difficulty. She is afebrile in our office and has no abdominal pain or tenderness. The drain site is clean and dry. There is no evidence of infection.  I do not believe that the patient had acute cholecystitis in the beginning. She may have had a thickened gallbladder wall but this may have been from her cardiac disease. The fact that she's remained asymptomatic more than a week after her drain was removed and likely more than who knows how long from the drain being outside of the gallbladder. She is to return to see me on an as-needed basis particularly if she should become more symptomatic with pain and tenderness. Even under those circumstances she is not the best surgical candidate because of significant medical morbidities.

## 2013-03-25 ENCOUNTER — Other Ambulatory Visit: Payer: Self-pay | Admitting: *Deleted

## 2013-03-25 DIAGNOSIS — I872 Venous insufficiency (chronic) (peripheral): Secondary | ICD-10-CM

## 2013-05-13 ENCOUNTER — Encounter: Payer: Self-pay | Admitting: Vascular Surgery

## 2013-05-14 ENCOUNTER — Encounter (INDEPENDENT_AMBULATORY_CARE_PROVIDER_SITE_OTHER): Payer: Medicare Other | Admitting: *Deleted

## 2013-05-14 ENCOUNTER — Ambulatory Visit (INDEPENDENT_AMBULATORY_CARE_PROVIDER_SITE_OTHER): Payer: Medicare Other | Admitting: Vascular Surgery

## 2013-05-14 ENCOUNTER — Encounter: Payer: Self-pay | Admitting: Vascular Surgery

## 2013-05-14 VITALS — BP 139/66 | HR 63 | Ht 62.0 in | Wt 200.0 lb

## 2013-05-14 DIAGNOSIS — I83893 Varicose veins of bilateral lower extremities with other complications: Secondary | ICD-10-CM

## 2013-05-14 DIAGNOSIS — I872 Venous insufficiency (chronic) (peripheral): Secondary | ICD-10-CM

## 2013-05-14 DIAGNOSIS — R609 Edema, unspecified: Secondary | ICD-10-CM

## 2013-05-14 DIAGNOSIS — Z01810 Encounter for preprocedural cardiovascular examination: Secondary | ICD-10-CM

## 2013-05-15 NOTE — Progress Notes (Signed)
[facility] [place] HISTORY AND PHYSICAL   History of Present Illness:  Patient is a 79 y.o. year old female who presents for evaluation of chronic venous insufficiency prior to considering knee replacement. The patient is referred by Dr. French Ana. The patient has had multiple chronic exacerbations and remissions of venous stasis ulcers in the past. These have all healed spontaneously. These have usually been treated with compression therapy. However, she is not and chronic lower extremity compression stockings. She is minimally ambulatory secondary to problems in her back as well as her knees. For the most part she gets around with a motorized wheelchair. She walks so minimally that time not able to assess any claudication symptoms. She denies rest pain. She does occasionally develop swelling in her lower extremities. She was fairly debilitated on my exam today and was actually barely able to climb onto the exam table and was actually unable to lie completely flat 2 to severe pain in her back and she was only able to extend or back to a position of 45 in her knees to 45. Other medical problems include hypertension, arthritis, hyperlipidemia, coronary disease all of which are currently stable.  She denies any current venous ulcerations. She denies prior history of DVT.  Past Medical History  Diagnosis Date  . HTN (hypertension)   . DJD (degenerative joint disease)   . Osteopenia   . Dyslipidemia   . Venous ulcer   . Thrombocytopenia   . CAD (coronary artery disease)     a. NSTEMI 2/14 => LHC 10/20/12:  dLM 20%, pLAD 99%, mLAD 60% followed by 99%, oD1 99%, and pD2 30%, mild plaque disease in the CFX and RCA. PCI: Xience Xpedition DES to the proximal and mid LAD  . Hx of echocardiogram     a. Echo 10/18/12: mod LVH, EF 60-65%, Gr 1 diast dysfn, mild AI, mild LAE.    Past Surgical History  Procedure Laterality Date  . Back surgery      Lumbar disc  . Hip surgery      R THR  .  Eye surgery      Blepharoplasty  . Insertion / placement / revision neurostimulator    . Joint replacement       Social History History  Substance Use Topics  . Smoking status: Never Smoker   . Smokeless tobacco: Not on file  . Alcohol Use: No    Family History Family History  Problem Relation Age of Onset  . CAD Father 57    Vague history     Allergies  No Known Allergies   Current Outpatient Prescriptions  Medication Sig Dispense Refill  . amLODipine (NORVASC) 10 MG tablet Take 10 mg by mouth daily.      Marland Kitchen aspirin 81 MG tablet Take 81 mg by mouth daily.      Marland Kitchen atorvastatin (LIPITOR) 80 MG tablet Take 80 mg by mouth daily. RX is written for 6pm. Nursing Home administers at 8am      . Calcium Carbonate-Vitamin D (CALCARB 600/D PO) Take 2 tablets by mouth daily.      . fluticasone (FLONASE) 50 MCG/ACT nasal spray Place 2 sprays into the nose daily.      . furosemide (LASIX) 80 MG tablet Take 80 mg by mouth 2 (two) times daily.       Marland Kitchen gabapentin (NEURONTIN) 800 MG tablet Take 800 mg by mouth 4 (four) times daily.      Marland Kitchen glucosamine-chondroitin 500-400 MG tablet  Take 2 tablets by mouth daily.      . metoprolol tartrate (LOPRESSOR) 25 MG tablet Take 75 mg by mouth 2 (two) times daily.      Marland Kitchen morphine (MS CONTIN) 100 MG 12 hr tablet Take 100 mg by mouth 2 (two) times daily.      . potassium chloride SA (K-DUR,KLOR-CON) 20 MEQ tablet Take 20 mEq by mouth 2 (two) times daily.      Marland Kitchen saccharomyces boulardii (FLORASTOR) 250 MG capsule Take 250 mg by mouth 3 (three) times daily.       . Ticagrelor (BRILINTA) 90 MG TABS tablet Take 1 tablet (90 mg total) by mouth 2 (two) times daily.  60 tablet  0  . Multiple Vitamin (MULTIVITAMIN WITH MINERALS) TABS Take 1 tablet by mouth daily.      Marland Kitchen oxyCODONE (OXYCONTIN) 20 MG 12 hr tablet Take 20 mg by mouth every 6 (six) hours as needed for pain.      . Pollen Extracts (PROSTAT PO) Take 30 mLs by mouth 3 (three) times daily with meals.      .  promethazine (PHENERGAN) 12.5 MG tablet Take 12.5 mg by mouth every 4 (four) hours as needed for nausea.       . vitamin B-12 (CYANOCOBALAMIN) 1000 MCG tablet Take 1,000 mcg by mouth daily.      . vitamin C (ASCORBIC ACID) 500 MG tablet Take 500 mg by mouth daily.       No current facility-administered medications for this visit.    ROS:   General:  No weight loss, Fever, chills  HEENT: No recent headaches, no nasal bleeding, no visual changes, no sore throat  Neurologic: No dizziness, blackouts, seizures. No recent symptoms of stroke or mini- stroke. No recent episodes of slurred speech, or temporary blindness.  Cardiac: No recent episodes of chest pain/pressure, no shortness of breath at rest.  + shortness of breath with exertion.  Denies history of atrial fibrillation or irregular heartbeat  Vascular: No history of rest pain in feet.  No history of claudication.  + history of non-healing ulcer, No history of DVT   Pulmonary: No home oxygen, no productive cough, no hemoptysis,  No asthma or wheezing  Musculoskeletal:  [x ] Arthritis, [ x] Low back pain,  [ x] Joint pain  Hematologic:No history of hypercoagulable state.  No history of easy bleeding.  No history of anemia  Gastrointestinal: No hematochezia or melena,  No gastroesophageal reflux, no trouble swallowing  Urinary: _0  chronic Kidney disease, _1  on HD - _2  MWF or _3  TTHS, _4  Burning with urination, _5  Frequent urination, _6  Difficulty urinating;   Skin: No rashes  Psychological: No history of anxiety,  No history of depression   Physical Examination  Filed Vitals:   05/14/13 1434  BP: 139/66  Pulse: 63  Height: _7  (1.575 m)  Weight: 200 lb (90.719 kg)  SpO2: 98%    Body mass index is 36.57 kg/(m^2).  General:  Alert and oriented, no acute distress HEENT: Normal Neck: No bruit or JVD Pulmonary: Clear to auscultation bilaterally Cardiac: Regular Rate and Rhythm without murmur Abdomen: Soft,  non-tender, non-distended, no mass Skin: No rash, 2 mm ulceration right medial calf brawny staining with thickening of skin bilateral lower extremities from the medial calf area Extremity Pulses:  2+ radial, brachial, femoral, absent dorsalis pedis, posterior tibial pulses bilaterally however this may be due to edema Musculoskeletal: Chronic woody edema bilaterally  Neurologic: Upper and lower extremity motor 5/5 and symmetric  DATA: The patient had bilateral venous reflux exam today. However, it was difficult to examine the deep venous system bilaterally due to the fact that the patient was unable to cooperate with positioning do to her severe back and lower extremity problems area she did have evidence of reflux in her left greater saphenous vein with vein diameter 4-6 mm she also had some reflux in the right greater saphenous vein with skip areas vein diameter 6 mm   ASSESSMENT: Evidence of chronic venous insufficiency with multiple exacerbations and remissions of ulcerations in the past but not really currently active. She may also have a mild element of peripheral arterial disease as her pulse exam is not completely normal. However, I would consider this asymptomatic in light of the fact that she is not really ambulatory. The current ulceration I believe is venous in character. It is currently quite small and does not really require Unna boot therapy at this time.   PLAN:  Bilateral lower extremity compression stockings which the patient should wear at all times. Due to her immobility she may have slight increased risk of DVT with knee replacement. Due to her chronic lower extremity edema she may also have slightly high risk of wound healing problems. A prescription was given to her today for bilateral lower extremity compression stockings 25-30 mm. She will followup with me on as-needed basis.  Ruta Hinds, MD [facility] [place] Office: [phone] Pager:  [phone]

## 2013-07-16 ENCOUNTER — Inpatient Hospital Stay (HOSPITAL_COMMUNITY)
Admission: AD | Admit: 2013-07-16 | Discharge: 2013-07-20 | DRG: 603 | Disposition: A | Discharge status: Skilled Nursing Facility | Payer: Medicare Other | Source: Ambulatory Visit | Attending: Internal Medicine | Admitting: Internal Medicine

## 2013-07-16 ENCOUNTER — Encounter (HOSPITAL_COMMUNITY): Payer: Self-pay | Admitting: General Practice

## 2013-07-16 DIAGNOSIS — R609 Edema, unspecified: Secondary | ICD-10-CM

## 2013-07-16 DIAGNOSIS — I252 Old myocardial infarction: Secondary | ICD-10-CM

## 2013-07-16 DIAGNOSIS — Z8249 Family history of ischemic heart disease and other diseases of the circulatory system: Secondary | ICD-10-CM

## 2013-07-16 DIAGNOSIS — I200 Unstable angina: Secondary | ICD-10-CM

## 2013-07-16 DIAGNOSIS — M899 Disorder of bone, unspecified: Secondary | ICD-10-CM | POA: Diagnosis present

## 2013-07-16 DIAGNOSIS — K810 Acute cholecystitis: Secondary | ICD-10-CM

## 2013-07-16 DIAGNOSIS — I499 Cardiac arrhythmia, unspecified: Secondary | ICD-10-CM | POA: Diagnosis present

## 2013-07-16 DIAGNOSIS — R6520 Severe sepsis without septic shock: Secondary | ICD-10-CM

## 2013-07-16 DIAGNOSIS — M549 Dorsalgia, unspecified: Secondary | ICD-10-CM

## 2013-07-16 DIAGNOSIS — R072 Precordial pain: Secondary | ICD-10-CM

## 2013-07-16 DIAGNOSIS — R079 Chest pain, unspecified: Secondary | ICD-10-CM

## 2013-07-16 DIAGNOSIS — I25810 Atherosclerosis of coronary artery bypass graft(s) without angina pectoris: Secondary | ICD-10-CM

## 2013-07-16 DIAGNOSIS — G8929 Other chronic pain: Secondary | ICD-10-CM

## 2013-07-16 DIAGNOSIS — M1990 Unspecified osteoarthritis, unspecified site: Secondary | ICD-10-CM | POA: Diagnosis present

## 2013-07-16 DIAGNOSIS — Z9861 Coronary angioplasty status: Secondary | ICD-10-CM

## 2013-07-16 DIAGNOSIS — D693 Immune thrombocytopenic purpura: Secondary | ICD-10-CM | POA: Diagnosis present

## 2013-07-16 DIAGNOSIS — D696 Thrombocytopenia, unspecified: Secondary | ICD-10-CM

## 2013-07-16 DIAGNOSIS — Z7982 Long term (current) use of aspirin: Secondary | ICD-10-CM

## 2013-07-16 DIAGNOSIS — I10 Essential (primary) hypertension: Secondary | ICD-10-CM

## 2013-07-16 DIAGNOSIS — F1120 Opioid dependence, uncomplicated: Secondary | ICD-10-CM

## 2013-07-16 DIAGNOSIS — F1920 Other psychoactive substance dependence, uncomplicated: Secondary | ICD-10-CM | POA: Diagnosis present

## 2013-07-16 DIAGNOSIS — L03116 Cellulitis of left lower limb: Secondary | ICD-10-CM

## 2013-07-16 DIAGNOSIS — E876 Hypokalemia: Secondary | ICD-10-CM

## 2013-07-16 DIAGNOSIS — R7881 Bacteremia: Secondary | ICD-10-CM

## 2013-07-16 DIAGNOSIS — E785 Hyperlipidemia, unspecified: Secondary | ICD-10-CM

## 2013-07-16 DIAGNOSIS — A419 Sepsis, unspecified organism: Secondary | ICD-10-CM

## 2013-07-16 DIAGNOSIS — I83893 Varicose veins of bilateral lower extremities with other complications: Secondary | ICD-10-CM

## 2013-07-16 DIAGNOSIS — Z96649 Presence of unspecified artificial hip joint: Secondary | ICD-10-CM

## 2013-07-16 DIAGNOSIS — L02419 Cutaneous abscess of limb, unspecified: Principal | ICD-10-CM | POA: Diagnosis present

## 2013-07-16 DIAGNOSIS — I872 Venous insufficiency (chronic) (peripheral): Secondary | ICD-10-CM | POA: Diagnosis present

## 2013-07-16 DIAGNOSIS — Z79899 Other long term (current) drug therapy: Secondary | ICD-10-CM

## 2013-07-16 LAB — COMPREHENSIVE METABOLIC PANEL
ALT: 6 U/L (ref 0–35)
Calcium: 9.7 mg/dL (ref 8.4–10.5)
Chloride: 98 mEq/L (ref 96–112)
Creatinine, Ser: 0.97 mg/dL (ref 0.50–1.10)
GFR calc Af Amer: 62 mL/min — ABNORMAL LOW (ref >90)
GFR calc non Af Amer: 54 mL/min — ABNORMAL LOW (ref >90)
Glucose, Bld: 82 mg/dL (ref 70–99)
Sodium: 139 mEq/L (ref 135–145)
Total Protein: 8.5 g/dL — ABNORMAL HIGH (ref 6.0–8.3)

## 2013-07-16 MED ORDER — AMLODIPINE BESYLATE 10 MG PO TABS
10.0000 mg | ORAL_TABLET | Freq: Every day | ORAL | Status: DC
  Administered 2013-07-17 – 2013-07-20 (×4): 10 mg via ORAL
  Filled 2013-07-16 (×4): qty 1

## 2013-07-16 MED ORDER — METOPROLOL TARTRATE 50 MG PO TABS
75.0000 mg | ORAL_TABLET | Freq: Two times a day (BID) | ORAL | Status: DC
  Administered 2013-07-16 – 2013-07-20 (×8): 75 mg via ORAL
  Filled 2013-07-16 (×10): qty 1

## 2013-07-16 MED ORDER — OXYCODONE HCL 5 MG PO TABS
10.0000 mg | ORAL_TABLET | ORAL | Status: DC | PRN
  Administered 2013-07-16 – 2013-07-17 (×3): 10 mg via ORAL
  Filled 2013-07-16 (×4): qty 2

## 2013-07-16 MED ORDER — VITAMIN C 500 MG PO TABS
500.0000 mg | ORAL_TABLET | Freq: Every day | ORAL | Status: DC
  Administered 2013-07-17 – 2013-07-20 (×4): 500 mg via ORAL
  Filled 2013-07-16 (×4): qty 1

## 2013-07-16 MED ORDER — ADULT MULTIVITAMIN W/MINERALS CH
1.0000 | ORAL_TABLET | Freq: Every day | ORAL | Status: DC
  Administered 2013-07-16 – 2013-07-20 (×5): 1 via ORAL
  Filled 2013-07-16 (×5): qty 1

## 2013-07-16 MED ORDER — ASPIRIN 81 MG PO TABS
81.0000 mg | ORAL_TABLET | Freq: Every day | ORAL | Status: DC
Start: 2013-07-16 — End: 2013-07-16

## 2013-07-16 MED ORDER — POTASSIUM CHLORIDE CRYS ER 20 MEQ PO TBCR
60.0000 meq | EXTENDED_RELEASE_TABLET | Freq: Once | ORAL | Status: AC
  Administered 2013-07-16: 20:00:00 60 meq via ORAL
  Filled 2013-07-16: qty 3

## 2013-07-16 MED ORDER — GABAPENTIN 400 MG PO CAPS
800.0000 mg | ORAL_CAPSULE | Freq: Four times a day (QID) | ORAL | Status: DC
  Administered 2013-07-16 – 2013-07-20 (×16): 800 mg via ORAL
  Filled 2013-07-16 (×19): qty 2

## 2013-07-16 MED ORDER — ASPIRIN EC 81 MG PO TBEC
81.0000 mg | DELAYED_RELEASE_TABLET | Freq: Every day | ORAL | Status: DC
  Administered 2013-07-17 – 2013-07-20 (×4): 81 mg via ORAL
  Filled 2013-07-16 (×5): qty 1

## 2013-07-16 MED ORDER — TICAGRELOR 90 MG PO TABS
90.0000 mg | ORAL_TABLET | Freq: Two times a day (BID) | ORAL | Status: DC
  Administered 2013-07-16 – 2013-07-20 (×8): 90 mg via ORAL
  Filled 2013-07-16 (×10): qty 1

## 2013-07-16 MED ORDER — SACCHAROMYCES BOULARDII 250 MG PO CAPS
250.0000 mg | ORAL_CAPSULE | Freq: Three times a day (TID) | ORAL | Status: DC
  Administered 2013-07-16 – 2013-07-20 (×12): 250 mg via ORAL
  Filled 2013-07-16 (×14): qty 1

## 2013-07-16 MED ORDER — POTASSIUM CHLORIDE CRYS ER 20 MEQ PO TBCR
20.0000 meq | EXTENDED_RELEASE_TABLET | Freq: Two times a day (BID) | ORAL | Status: DC
  Administered 2013-07-17: 20 meq via ORAL
  Filled 2013-07-16 (×5): qty 1

## 2013-07-16 MED ORDER — ENOXAPARIN SODIUM 40 MG/0.4ML ~~LOC~~ SOLN
40.0000 mg | SUBCUTANEOUS | Status: DC
  Administered 2013-07-16: 22:00:00 40 mg via SUBCUTANEOUS
  Filled 2013-07-16 (×2): qty 0.4

## 2013-07-16 MED ORDER — FLUTICASONE PROPIONATE 50 MCG/ACT NA SUSP
2.0000 | Freq: Every day | NASAL | Status: DC
  Administered 2013-07-17 – 2013-07-19 (×3): 2 via NASAL
  Filled 2013-07-16: qty 16

## 2013-07-16 MED ORDER — VANCOMYCIN HCL 10 G IV SOLR
1500.0000 mg | INTRAVENOUS | Status: DC
  Administered 2013-07-16 – 2013-07-19 (×4): 1500 mg via INTRAVENOUS
  Filled 2013-07-16 (×6): qty 1500

## 2013-07-16 MED ORDER — OXYCODONE HCL 20 MG PO TB12: 10.0000 mg | ORAL_TABLET | Freq: Four times a day (QID) | ORAL | Status: DC | PRN

## 2013-07-16 MED ORDER — VITAMIN B-12 1000 MCG PO TABS
1000.0000 ug | ORAL_TABLET | Freq: Every day | ORAL | Status: DC
  Administered 2013-07-16 – 2013-07-20 (×5): 1000 ug via ORAL
  Filled 2013-07-16 (×5): qty 1

## 2013-07-16 MED ORDER — AMLODIPINE BESYLATE 10 MG PO TABS: 10.0000 mg | ORAL_TABLET | Freq: Every day | ORAL | Status: DC

## 2013-07-16 MED ORDER — FUROSEMIDE 80 MG PO TABS
80.0000 mg | ORAL_TABLET | Freq: Two times a day (BID) | ORAL | Status: DC
  Administered 2013-07-16 – 2013-07-18 (×4): 80 mg via ORAL
  Filled 2013-07-16 (×6): qty 1

## 2013-07-16 MED ORDER — MORPHINE SULFATE ER 30 MG PO TBCR
60.0000 mg | EXTENDED_RELEASE_TABLET | Freq: Two times a day (BID) | ORAL | Status: DC
  Administered 2013-07-17 – 2013-07-20 (×8): 60 mg via ORAL
  Filled 2013-07-16 (×8): qty 2

## 2013-07-16 MED ORDER — GABAPENTIN 800 MG PO TABS
800.0000 mg | ORAL_TABLET | Freq: Four times a day (QID) | ORAL | Status: DC
  Filled 2013-07-16 (×3): qty 1

## 2013-07-16 MED ORDER — ATORVASTATIN CALCIUM 80 MG PO TABS
80.0000 mg | ORAL_TABLET | Freq: Every day | ORAL | Status: DC
  Administered 2013-07-17 – 2013-07-19 (×3): 80 mg via ORAL
  Filled 2013-07-16 (×5): qty 1

## 2013-07-16 NOTE — Progress Notes (Signed)
ANTIBIOTIC CONSULT NOTE - INITIAL  Pharmacy Consult for Vancomycin Indication: cellulitis  No Known Allergies  Patient Measurements: Height: _0  (162.6 cm) IBW/kg (Calculated) : 54.7  Microbiology: No results found for this or any previous visit (from the past 720 hour(s)).  Medical History: Past Medical History  Diagnosis Date  . HTN (hypertension)   . DJD (degenerative joint disease)   . Osteopenia   . Dyslipidemia   . Venous ulcer   . Thrombocytopenia   . CAD (coronary artery disease)     a. NSTEMI 2/14 => LHC 10/20/12:  dLM 20%, pLAD 99%, mLAD 60% followed by 99%, oD1 99%, and pD2 30%, mild plaque disease in the CFX and RCA. PCI: Xience Xpedition DES to the proximal and mid LAD  . Hx of echocardiogram     a. Echo 10/18/12: mod LVH, EF 60-65%, Gr 1 diast dysfn, mild AI, mild LAE.    Assessment: 79 year old female with a history of venous insufficiency, multiple back surgeries, and chronic pain from [facility] with erythema, edema, and pain due to cellulitis which extends up her left leg and ends at her hip.  Pharmacy asked to begin IV vancomycin, Scr = 0.70  Goal of Therapy:  Vancomycin trough level 10-15 mcg/ml  Plan:  1) Vancomycin 1500 mg iv Q 24 hours 2) Follow up fever trend, scr, cultures.  Thank you. Anette Guarneri, PharmD [phone]  07/16/2013,1:00 PM

## 2013-07-16 NOTE — Consult Note (Signed)
WOC wound consult note Reason for Consult: Consult requested for left leg cellulitis.  Generalized erythremia and edema extending from left foot to left thigh.  Wound type:3 areas of partial thickness skin loss; left outer ankle .5X.5X.1cm, .3X.3X.1c,, inner ankle .5X.5X.1cm.  All sites dark reddish-purple.  Small amt yellow drainage, no odor.  Dressing procedure/placement/frequency: Foam dressing to protect from further injury and promote healing. Topical treatment is minimally effective for cellulitis, expect IV antibiotics will provide the best outcomes. Please re-consult if further assistance is needed.  Thank-you,  Julien Girt MSN, [place], [place], [place], [place]

## 2013-07-16 NOTE — Progress Notes (Deleted)
[facility] History and Physical  [name] [name] DOB: May 22, 1931 DOA: 07/16/2013  Referring physician:  PCP: Mathews Argyle, MD    Chief Complaint: Cellulitis secondary to venous stasis.   HPI: [name] [name] is a 79 y.o. female with a history of chronic venous insufficiency, multiple back surgeries, and chronic pain who presents today from [facility] with erythema, edema, and pain due to cellulitis which extends up her left leg and ends at her hip.  This was treated with Augmentin as an outpatient, but continued to become worse. Venous duplex was done prior to admission and was negative for DVT.   She reports fever Tuesday night, but no other chills or symptoms of illnes. She reports 1 episode of diarrhea daily.  She denies chest pain, syncope, and  abdominal pain.  She rarely walks but usually gets around in her power wheel chair.     Past Medical History  Diagnosis Date  . HTN (hypertension)   . DJD (degenerative joint disease)   . Osteopenia   . Dyslipidemia   . Venous ulcer   . Thrombocytopenia   . CAD (coronary artery disease)     a. NSTEMI 2/14 => LHC 10/20/12:  dLM 20%, pLAD 99%, mLAD 60% followed by 99%, oD1 99%, and pD2 30%, mild plaque disease in the CFX and RCA. PCI: Xience Xpedition DES to the proximal and mid LAD  . Hx of echocardiogram     a. Echo 10/18/12: mod LVH, EF 60-65%, Gr 1 diast dysfn, mild AI, mild LAE.   Past Surgical History  Procedure Laterality Date  . Back surgery      Lumbar disc  . Hip surgery      R THR  . Eye surgery      Blepharoplasty  . Insertion / placement / revision neurostimulator    . Joint replacement     Social History:  reports that she has never smoked. She does not have any smokeless tobacco history on file. She reports that she does not drink alcohol or use illicit drugs. Lives at [facility] Uses electric chair   No Known Allergies   Family History  Problem Relation Age of Onset  . CAD Father  34    Vague history     Prior to Admission medications   Medication Sig Start Date End Date Taking? Authorizing Provider  amLODipine (NORVASC) 10 MG tablet Take 10 mg by mouth daily.    Historical Provider, MD  aspirin 81 MG tablet Take 81 mg by mouth daily.    Historical Provider, MD  atorvastatin (LIPITOR) 80 MG tablet Take 80 mg by mouth daily. RX is written for 6pm. Nursing Home administers at [place] Provider, MD  Calcium Carbonate-Vitamin D (CALCARB 600/D PO) Take 2 tablets by mouth daily.    Historical Provider, MD  fluticasone (FLONASE) 50 MCG/ACT nasal spray Place 2 sprays into the nose daily.    Historical Provider, MD  furosemide (LASIX) 80 MG tablet Take 80 mg by mouth 2 (two) times daily.     Historical Provider, MD  gabapentin (NEURONTIN) 800 MG tablet Take 800 mg by mouth 4 (four) times daily.    Historical Provider, MD  glucosamine-chondroitin 500-400 MG tablet Take 2 tablets by mouth daily.    Historical Provider, MD  metoprolol tartrate (LOPRESSOR) 25 MG tablet Take 75 mg by mouth 2 (two) times daily.    Historical Provider, MD  morphine (MS CONTIN) 100 MG 12 hr tablet  Take 100 mg by mouth 2 (two) times daily.    Historical Provider, MD  Multiple Vitamin (MULTIVITAMIN WITH MINERALS) TABS Take 1 tablet by mouth daily.    Historical Provider, MD  oxyCODONE (OXYCONTIN) 20 MG 12 hr tablet Take 20 mg by mouth every 6 (six) hours as needed for pain.    Historical Provider, MD  Pollen Extracts (PROSTAT PO) Take 30 mLs by mouth 3 (three) times daily with meals.    Historical Provider, MD  potassium chloride SA (K-DUR,KLOR-CON) 20 MEQ tablet Take 20 mEq by mouth 2 (two) times daily.    Historical Provider, MD  promethazine (PHENERGAN) 12.5 MG tablet Take 12.5 mg by mouth every 4 (four) hours as needed for nausea.     Historical Provider, MD  saccharomyces boulardii (FLORASTOR) 250 MG capsule Take 250 mg by mouth 3 (three) times daily.     Historical Provider, MD  Ticagrelor  (BRILINTA) 90 MG TABS tablet Take 1 tablet (90 mg total) by mouth 2 (two) times daily. 10/21/12   Sorin June Leap, MD  vitamin B-12 (CYANOCOBALAMIN) 1000 MCG tablet Take 1,000 mcg by mouth daily.    Historical Provider, MD  vitamin C (ASCORBIC ACID) 500 MG tablet Take 500 mg by mouth daily.    Historical Provider, MD   Physical Exam: Filed Vitals:   07/16/13 1052  BP: 121/70  Pulse: 96  Temp: 98.4 F (36.9 C)  Resp: 18     General:  Well developed, well nourished, a bit confused  Cardiovascular: irregular, rate controlled.  No m/r/g  Respiratory: lungs clear bilaterally, good air movement, no accessory muscle use.  Abdomen: soft, non-tender, non-distended, + bowel sounds, no masses  Skin:  erythema (L and R)and ulcerations (L) of the lower extremities. Extensive but light erythema extending up the leg to the hip.  Musculoskeletal: able to move all extremities, LL bilateral edema   Labs on Admission:  None.  Radiological Exams on Admission: No results found.  EKG: ordered  Assessment/Plan  Left leg Cellulitis (acute portion is over the left thigh and hip.) -blood cultures -IV vancomycin per pharmacy -Traced the affected area.  Venous Stasis -elevate both legs -wound care consult -left doppler US  done-no clots found -checking ABIs -Wound care consult for ulcerations.  Backpain -MS cotin 18m x2 daily.  Will decrease to 60 mg bid and see how she does. -Oxycodone 250mq 4 prn  Irregular Heartbeat -EKG -Not on anticoagulation - likely high fall risk.  Hypertension  -recent BP 121/70 -controlled on amlodipine   -Lovenox for DVT prophylaxis     Code Status: full Family Communication: none in the room Disposition Plan: inpatient  Time spent: [id number] [address] CourtTeColoradoA-S MaImogene BurnPAVermontriad Hospitalists Pager [phone] 7PM-7AM, please contact night-coverage [email or web address] Password [email or web address] 07/16/2013, 12:57 PM

## 2013-07-16 NOTE — Progress Notes (Signed)
79 year old lady residing at SNF, with h/o chronic venous insufficiency, developed left leg erythema and swelling. She was started on augmentin, and venous duplex done . Venous duplex negative for DVT. Her cellulitis over the left leg worsened and it extended up to the thigh. She is referred for direct admission for IV antibiotics for left leg cellulitis.  As per Dr Felipa Eth, pt is hemodynamically stable.  She will be admitted to med surg bed, to team 10.   Hosie Poisson, MD [phone]

## 2013-07-16 NOTE — H&P (Signed)
[facility] History and Physical  [name] [name] POE:[id number] DOB: May 22, 1931 DOA: 07/16/2013  Referring physician:  PCP: [name] Argyle, MD    Chief Complaint: Cellulitis secondary to venous stasis.   HPI: [name] [name] is a 79 y.o. female with a history of chronic venous insufficiency, multiple back surgeries, and chronic pain who presents today from [facility] with erythema, edema, and pain due to cellulitis which extends up her left leg and ends at her hip.  This was treated with Augmentin as an outpatient, but continued to become worse. Venous duplex was done prior to admission and was negative for DVT.   She reports fever Tuesday night, but no other chills or symptoms of illnes. She reports 1 episode of diarrhea daily.  She denies chest pain, syncope, and  abdominal pain.  She rarely walks but usually gets around in her power wheel chair.     Past Medical History  Diagnosis Date  . HTN (hypertension)   . DJD (degenerative joint disease)   . Osteopenia   . Dyslipidemia   . Venous ulcer   . Thrombocytopenia   . CAD (coronary artery disease)     a. NSTEMI 2/14 => LHC 10/20/12:  dLM 20%, pLAD 99%, mLAD 60% followed by 99%, oD1 99%, and pD2 30%, mild plaque disease in the CFX and RCA. PCI: Xience Xpedition DES to the proximal and mid LAD  . Hx of echocardiogram     a. Echo 10/18/12: mod LVH, EF 60-65%, Gr 1 diast dysfn, mild AI, mild LAE.   Past Surgical History  Procedure Laterality Date  . Back surgery      Lumbar disc  . Hip surgery      R THR  . Eye surgery      Blepharoplasty  . Insertion / placement / revision neurostimulator    . Joint replacement     Social History:  reports that she has never smoked. She does not have any smokeless tobacco history on file. She reports that she does not drink alcohol or use illicit drugs. Lives at [facility] Uses electric chair   No Known Allergies   Family History  Problem Relation Age of Onset  . CAD Father  34    Vague history     Prior to Admission medications   Medication Sig Start Date End Date Taking? Authorizing Provider  amLODipine (NORVASC) 10 MG tablet Take 10 mg by mouth daily.    Historical Provider, MD  aspirin 81 MG tablet Take 81 mg by mouth daily.    Historical Provider, MD  atorvastatin (LIPITOR) 80 MG tablet Take 80 mg by mouth daily. RX is written for 6pm. Nursing Home administers at [place] Provider, MD  Calcium Carbonate-Vitamin D (CALCARB 600/D PO) Take 2 tablets by mouth daily.    Historical Provider, MD  fluticasone (FLONASE) 50 MCG/ACT nasal spray Place 2 sprays into the nose daily.    Historical Provider, MD  furosemide (LASIX) 80 MG tablet Take 80 mg by mouth 2 (two) times daily.     Historical Provider, MD  gabapentin (NEURONTIN) 800 MG tablet Take 800 mg by mouth 4 (four) times daily.    Historical Provider, MD  glucosamine-chondroitin 500-400 MG tablet Take 2 tablets by mouth daily.    Historical Provider, MD  metoprolol tartrate (LOPRESSOR) 25 MG tablet Take 75 mg by mouth 2 (two) times daily.    Historical Provider, MD  morphine (MS CONTIN) 100 MG 12 hr tablet  Take 100 mg by mouth 2 (two) times daily.    Historical Provider, MD  Multiple Vitamin (MULTIVITAMIN WITH MINERALS) TABS Take 1 tablet by mouth daily.    Historical Provider, MD  oxyCODONE (OXYCONTIN) 20 MG 12 hr tablet Take 20 mg by mouth every 6 (six) hours as needed for pain.    Historical Provider, MD  Pollen Extracts (PROSTAT PO) Take 30 mLs by mouth 3 (three) times daily with meals.    Historical Provider, MD  potassium chloride SA (K-DUR,KLOR-CON) 20 MEQ tablet Take 20 mEq by mouth 2 (two) times daily.    Historical Provider, MD  promethazine (PHENERGAN) 12.5 MG tablet Take 12.5 mg by mouth every 4 (four) hours as needed for nausea.     Historical Provider, MD  saccharomyces boulardii (FLORASTOR) 250 MG capsule Take 250 mg by mouth 3 (three) times daily.     Historical Provider, MD  Ticagrelor  (BRILINTA) 90 MG TABS tablet Take 1 tablet (90 mg total) by mouth 2 (two) times daily. 10/21/12   [name] June Leap, MD  vitamin B-12 (CYANOCOBALAMIN) 1000 MCG tablet Take 1,000 mcg by mouth daily.    Historical Provider, MD  vitamin C (ASCORBIC ACID) 500 MG tablet Take 500 mg by mouth daily.    Historical Provider, MD   Physical Exam: Filed Vitals:   07/16/13 1052  BP: 121/70  Pulse: 96  Temp: 98.4 F (36.9 C)  Resp: 18     General:  Well developed, well nourished, a bit confused  Cardiovascular: irregular, rate controlled.  No m/r/g  Respiratory: lungs clear bilaterally, good air movement, no accessory muscle use.  Abdomen: soft, non-tender, non-distended, + bowel sounds, no masses  Skin:  erythema (L and R)and ulcerations (L) of the lower extremities. Extensive but light erythema extending up the leg to the hip.  Musculoskeletal: able to move all extremities, LL bilateral edema   Labs on Admission:  None.  Radiological Exams on Admission: No results found.  EKG: ordered  Assessment/Plan  Left leg Cellulitis (acute portion is over the left thigh and hip.) -Obtain blood cultures -IV vancomycin per pharmacy -Traced the affected area.  Venous Stasis -elevate both legs -wound care consult -left doppler US  done-no clots found -checking ABIs -Wound care consult for ulcerations.  Backpain -MS cotin 133m x2 daily.  Will decrease to 60 mg bid and see how she does. -Oxycodone 276mq 4 prn  Irregular Heartbeat -EKG -Not on anticoagulation - likely high fall risk.  Hypertension  -recent BP 121/70 -controlled on amlodipine   -Lovenox for DVT prophylaxis     Code Status: full Family Communication: none in the room Disposition Plan: inpatient  Time spent: [address] MaImogene [name] Pager [phone] 7PM-7AM, please contact night-coverage [email or web address] Password [email or web address] 07/16/2013, 1:20 PM     Addendum  Patient seen  and examined, chart and data base reviewed.  I agree with the above assessment and plan.  For full details please see Mrs. MaImogene BurnA note.  Chronic bilateral lower extremity venous stasis, came in with left lower extremity cellulitis. Placed on vancomycin.   MuBirdie HopesMD [facility] Pager: [phone]/30/2014, 1:21 PM

## 2013-07-16 NOTE — Progress Notes (Signed)
Patient admitted from [place] with venous stasis ulcers to left inner and outer ankle.  Dressing removed and mepilex applied.  First measuring 1x2 cm on left inner ankle with minimal drainage.  Second measuring 1x1 cm on left lower outer ankle and third directly above on left outer upper ankle 2x1 cm.  [name] [name]

## 2013-07-17 DIAGNOSIS — G8929 Other chronic pain: Secondary | ICD-10-CM

## 2013-07-17 DIAGNOSIS — I10 Essential (primary) hypertension: Secondary | ICD-10-CM

## 2013-07-17 LAB — CBC
Hemoglobin: 13 g/dL (ref 12.0–15.0)
MCH: 29.6 pg (ref 26.0–34.0)
MCHC: 33.2 g/dL (ref 30.0–36.0)
MCV: 89.1 fL (ref 78.0–100.0)
Platelets: 23 10*3/uL — CL (ref 150–400)
RBC: 4.39 MIL/uL (ref 3.87–5.11)
RDW: 14.5 % (ref 11.5–15.5)
WBC: 12.1 10*3/uL — ABNORMAL HIGH (ref 4.0–10.5)

## 2013-07-17 LAB — BASIC METABOLIC PANEL
CO2: 25 mEq/L (ref 19–32)
Chloride: 102 mEq/L (ref 96–112)
GFR calc non Af Amer: 58 mL/min — ABNORMAL LOW (ref >90)
Glucose, Bld: 112 mg/dL — ABNORMAL HIGH (ref 70–99)
Potassium: 3.4 mEq/L — ABNORMAL LOW (ref 3.5–5.1)
Sodium: 139 mEq/L (ref 135–145)

## 2013-07-17 MED ORDER — OXYCODONE HCL 5 MG PO TABS
10.0000 mg | ORAL_TABLET | Freq: Once | ORAL | Status: AC
  Administered 2013-07-17: 10:00:00 10 mg via ORAL
  Filled 2013-07-17: qty 2

## 2013-07-17 MED ORDER — OXYCODONE HCL 5 MG PO TABS
20.0000 mg | ORAL_TABLET | ORAL | Status: DC | PRN
  Administered 2013-07-17 – 2013-07-20 (×13): 20 mg via ORAL
  Filled 2013-07-17 (×14): qty 4

## 2013-07-17 MED ORDER — POTASSIUM CHLORIDE CRYS ER 20 MEQ PO TBCR
20.0000 meq | EXTENDED_RELEASE_TABLET | Freq: Two times a day (BID) | ORAL | Status: DC
  Administered 2013-07-17 (×2): 20 meq via ORAL
  Filled 2013-07-17 (×2): qty 1

## 2013-07-17 MED ORDER — POTASSIUM CHLORIDE CRYS ER 20 MEQ PO TBCR
40.0000 meq | EXTENDED_RELEASE_TABLET | Freq: Once | ORAL | Status: AC
  Administered 2013-07-17: 08:00:00 40 meq via ORAL
  Filled 2013-07-17: qty 2

## 2013-07-17 MED ORDER — OXYCODONE HCL 5 MG PO TABS
5.0000 mg | ORAL_TABLET | Freq: Once | ORAL | Status: AC
  Administered 2013-07-17: 5 mg via ORAL
  Filled 2013-07-17: qty 1

## 2013-07-17 NOTE — Accreditation Note (Signed)
CRITICAL VALUE ALERT  Critical value received:  Platelet 23  Date of notification:  07-17-13  Time of notification:  0815 Critical value read back:yes  Nurse who received alert:  Riccardo Dubin, RN MD notified (1st page):  [place] Time of first page:  0830 MD notified (2nd page):  Time of second page:  Responding MD:  Hartford Poli Time MD responded:  [id number]

## 2013-07-17 NOTE — Progress Notes (Addendum)
[facility] PROGRESS NOTE  [name] [name] KAJ:[id number] DOB: [birth year] DOA: 07/16/2013 PCP: Mathews Argyle, MD   HPI/Subjective: [name] [name] is a 79 y.o. female with a history of chronic venous insufficiency, multiple back surgeries, ITP, CAD, and chronic pain who presents today from [facility] with erythema, edema, and pain due to cellulitis which extends up her left leg and ends at her hip. This was treated with Augmentin as an outpatient, but continued to become worse. Venous duplex was done prior to admission and was negative for DVT.   She is complaining of pain in her back and legs today.   Assessment/Plan:  Left leg Cellulitis (acute portion is over the left thigh and hip.)  -Redness receeding. -blood cultures ordered, NGTD  -IV vancomycin per pharmacy  -Traced the affected area on 10/30  Venous Stasis  -elevate both legs  -wound care consult for ulcerations - recommendations made. -left LE doppler US completed outpatient -no clots found      Back Pain  -History of multiple back surgeries -MS contin 111m x2 daily. Will decrease to 60 mg bid and see how she does.  -Oxycodone 213mq 4 prn.  (Increased from 10 mg q 4 prn, back to "prior to admission dose of 20 mg q 4 prn"  Irregular Heartbeat  -stable  -EKG  Chronic Idiopathic Thrombocytopenia - Platelets 23,000 - Platelets were 18,000 in 12/2012 - Platelet appears to be chronically low, stable. Check CBC in AM.    Hypertension  -recent BP 104/73-controlled on amlodipine hold if systolic BP 10035r less  DVT Prophylaxis - Enoxaparin discontinued as platelets are 23.  Code Status: full Family Communication: none Disposition Plan:inpatient   Consultants:  Wound care  Procedures:  none  Antibiotics: IV Vancomycin    Objective: Filed Vitals:   07/17/13 0848  BP: 104/73  Pulse: 119  Temp: 98.1 F (36.7 C)  Resp: 18    Intake/Output Summary (Last 24 hours) at 07/17/13  1214 Last data filed at 07/17/13 0900  Gross per 24 hour  Intake   1121 ml  Output      0 ml  Net   1121 ml   Filed Weights   07/16/13 1052 07/17/13 0456  Weight: 91.173 kg (201 lb) 92.4 kg (203 lb 11.3 oz)    Exam:  General:  Well developed, well nourished, a bit confused Cardiovascular: irregular, rate controlled, No m/r/g Respiratory: lungs bilaterally clear with good air movement Abdomen: soft, non-tender, non-distended, + bowel sounds Musculoskeletal: able to move all extremities, LL bilateral edema Skin: erythema (L and R) and ulcerations (L) of lower extremities. Extensive but light erythema going up the left leg to just before the hip. Area smaller than yesterday.  Data Reviewed: Basic Metabolic Panel:  Recent Labs Lab 07/16/13 1350 07/17/13 0533  NA 139 139  K 3.3* 3.4*  CL 98 102  CO2 29 25  GLUCOSE 82 112*  BUN 17 16  CREATININE 0.97 0.91  CALCIUM 9.7 8.9   Liver Function Tests:  Recent Labs Lab 07/16/13 1350  AST 11  ALT 6  ALKPHOS 138*  BILITOT 0.7  PROT 8.5*  ALBUMIN 3.5   CBC:  Recent Labs Lab 07/17/13 0533  WBC 12.1*  HGB 13.0  HCT 39.1  MCV 89.1  PLT 23*     Recent Results (from the past 240 hour(s))  CULTURE, BLOOD (ROUTINE X 2)     Status: None   Collection Time    07/16/13  1:50 PM  Result Value Range Status   Specimen Description BLOOD LEFT ANTECUBITAL   Final   Special Requests BOTTLES DRAWN AEROBIC AND ANAEROBIC 5CC   Final   Culture  Setup Time     Final   Value: 07/16/2013 21:58     Performed at [facility]   Culture     Final   Value:        BLOOD CULTURE RECEIVED NO GROWTH TO DATE CULTURE WILL BE HELD FOR 5 DAYS BEFORE ISSUING A FINAL NEGATIVE REPORT     Performed at [facility]   Report Status PENDING   Incomplete  CULTURE, BLOOD (ROUTINE X 2)     Status: None   Collection Time    07/16/13  3:30 PM      Result Value Range Status   Specimen Description BLOOD RIGHT FOREARM   Final    Special Requests BOTTLES DRAWN AEROBIC AND ANAEROBIC 5CC   Final   Culture  Setup Time     Final   Value: 07/16/2013 21:58     Performed at [facility]   Culture     Final   Value:        BLOOD CULTURE RECEIVED NO GROWTH TO DATE CULTURE WILL BE HELD FOR 5 DAYS BEFORE ISSUING A FINAL NEGATIVE REPORT     Performed at [facility]   Report Status PENDING   Incomplete      Scheduled Meds: . amLODipine  10 mg Oral Daily  . aspirin EC  81 mg Oral Daily  . atorvastatin  80 mg Oral q1800  . fluticasone  2 spray Each Nare Daily  . furosemide  80 mg Oral BID  . gabapentin  800 mg Oral QID  . metoprolol tartrate  75 mg Oral BID  . morphine  60 mg Oral BID  . multivitamin with minerals  1 tablet Oral Daily  . potassium chloride SA  20 mEq Oral BID  . saccharomyces boulardii  250 mg Oral TID  . Ticagrelor  90 mg Oral BID  . vancomycin  1,500 mg Intravenous Q24H  . vitamin B-12  1,000 mcg Oral Daily  . vitamin C  500 mg Oral Daily   Continuous Infusions:   Active Problems:   Chronic back pain   Thrombocytopenia   CAD (coronary artery disease) of artery bypass graft   Varicose veins of lower extremities with other complications   Cellulitis of left lower extremity   Chronic narcotic dependence   Nicholes Rough, PA-S Karen Kitchens  [facility] Pager [phone]. If 7PM-7AM, please contact night-coverage at [email or web address], password [facility] 07/17/2013, 12:14 PM  LOS: 1 day      Addendum  Patient seen and examined, chart and data base reviewed.  I agree with the above assessment and plan.  For full details please see Ms. Nicholes Rough, PA-S note, reviewed by Mrs. Imogene Burn PA, then by me.   Birdie Hopes, MD [facility] Pager: [phone] 07/17/2013, 12:48 PM

## 2013-07-18 DIAGNOSIS — F1920 Other psychoactive substance dependence, uncomplicated: Secondary | ICD-10-CM

## 2013-07-18 DIAGNOSIS — R609 Edema, unspecified: Secondary | ICD-10-CM

## 2013-07-18 LAB — CBC
HCT: 40.2 % (ref 36.0–46.0)
Hemoglobin: 13.1 g/dL (ref 12.0–15.0)
MCH: 29.3 pg (ref 26.0–34.0)
MCHC: 32.6 g/dL (ref 30.0–36.0)
MCV: 89.9 fL (ref 78.0–100.0)
Platelets: 33 10*3/uL — ABNORMAL LOW (ref 150–400)
RBC: 4.47 MIL/uL (ref 3.87–5.11)
RDW: 14.6 % (ref 11.5–15.5)
WBC: 8.4 10*3/uL (ref 4.0–10.5)

## 2013-07-18 LAB — BASIC METABOLIC PANEL
BUN: 15 mg/dL (ref 6–23)
CO2: 24 mEq/L (ref 19–32)
Calcium: 9.1 mg/dL (ref 8.4–10.5)
Chloride: 100 mEq/L (ref 96–112)
Creatinine, Ser: 0.99 mg/dL (ref 0.50–1.10)
GFR calc non Af Amer: 52 mL/min — ABNORMAL LOW (ref >90)
Glucose, Bld: 100 mg/dL — ABNORMAL HIGH (ref 70–99)

## 2013-07-18 MED ORDER — POTASSIUM CHLORIDE CRYS ER 20 MEQ PO TBCR
40.0000 meq | EXTENDED_RELEASE_TABLET | Freq: Four times a day (QID) | ORAL | Status: AC
  Administered 2013-07-18 (×2): 40 meq via ORAL
  Filled 2013-07-18: qty 2

## 2013-07-18 MED ORDER — FUROSEMIDE 10 MG/ML IJ SOLN
80.0000 mg | Freq: Two times a day (BID) | INTRAMUSCULAR | Status: DC
  Administered 2013-07-18 – 2013-07-19 (×4): 80 mg via INTRAVENOUS
  Filled 2013-07-18 (×7): qty 8

## 2013-07-18 NOTE — Progress Notes (Signed)
[facility] PROGRESS NOTE  [name] YOM:[id number] DOB: 1931-09-13 DOA: 07/16/2013 PCP: Mathews Argyle, MD  Subjective: Denies any specific complaints, pain and redness is improving.  HPI: [name] [name] is a 79 y.o. female with a history of chronic venous insufficiency, multiple back surgeries, ITP, CAD, and chronic pain who presents today from [facility] with erythema, edema, and pain due to cellulitis which extends up her left leg and ends at her hip. This was treated with Augmentin as an outpatient, but continued to become worse. Venous duplex was done prior to admission and was negative for DVT.   Assessment/Plan:  Left leg Cellulitis (acute portion is over the left thigh and hip.)  -Redness receeding. -blood cultures ordered, NGTD  -IV vancomycin per pharmacy  -Traced the affected area on 10/30  Venous Stasis  -elevate both legs  -wound care consult for ulcerations - recommendations made. -left LE doppler US completed outpatient -no clots found     Back Pain  -History of multiple back surgeries -MS contin 161m x2 daily. Will decrease to 60 mg bid and see how she does.  -Oxycodone 220mq 4 prn.  (Increased from 10 mg q 4 prn, back to "prior to admission dose of 20 mg q 4 prn"  Irregular Heartbeat  -stable  -EKG  Chronic Idiopathic Thrombocytopenia - Platelets 23,000 - Platelets were 18,000 in 12/2012 - Platelet appears to be chronically low, stable. Check CBC in AM.  Hypertension  -recent BP 104/73-controlled on amlodipine hold if systolic BP 10414r less  DVT Prophylaxis - Enoxaparin discontinued as platelets are 23.  Code Status: full Family Communication: none Disposition Plan:inpatient   Consultants:  Wound care  Procedures:  none  Antibiotics: IV Vancomycin    Objective: Filed Vitals:   07/18/13 0600  BP: 130/83  Pulse: 125  Temp: 97.9 F (36.6 C)  Resp: 20    Intake/Output Summary (Last 24 hours) at 07/18/13  1022 Last data filed at 07/17/13 1800  Gross per 24 hour  Intake      0 ml  Output    950 ml  Net   -950 ml   Filed Weights   07/16/13 1052 07/17/13 0456 07/18/13 0600  Weight: 91.173 kg (201 lb) 92.4 kg (203 lb 11.3 oz) 90 kg (198 lb 6.6 oz)    Exam:  General:  Well developed, well nourished, a bit confused Cardiovascular: irregular, rate controlled, No m/r/g Respiratory: lungs bilaterally clear with good air movement Abdomen: soft, non-tender, non-distended, + bowel sounds Musculoskeletal: able to move all extremities, LL bilateral edema Skin: erythema (L and R) and ulcerations (L) of lower extremities. Extensive but light erythema going up the left leg to just before the hip. Area smaller than yesterday.  Data Reviewed: Basic Metabolic Panel:  Recent Labs Lab 07/16/13 1350 07/17/13 0533 07/18/13 0550  NA 139 139 137  K 3.3* 3.4* 3.7  CL 98 102 100  CO2 _0 GLUCOSE 82 112* 100*  BUN _1 CREATININE 0.97 0.91 0.99  CALCIUM 9.7 8.9 9.1   Liver Function Tests:  Recent Labs Lab 07/16/13 1350  AST 11  ALT 6  ALKPHOS 138*  BILITOT 0.7  PROT 8.5*  ALBUMIN 3.5   CBC:  Recent Labs Lab 07/17/13 0533 07/18/13 0550  WBC 12.1* 8.4  HGB 13.0 13.1  HCT 39.1 40.2  MCV 89.1 89.9  PLT 23* 33*     Recent Results (from the past 240 hour(s))  CULTURE, BLOOD (  ROUTINE X 2)     Status: None   Collection Time    07/16/13  1:50 PM      Result Value Range Status   Specimen Description BLOOD LEFT ANTECUBITAL   Final   Special Requests BOTTLES DRAWN AEROBIC AND ANAEROBIC 5CC   Final   Culture  Setup Time     Final   Value: 07/16/2013 21:58     Performed at [facility]   Culture     Final   Value:        BLOOD CULTURE RECEIVED NO GROWTH TO DATE CULTURE WILL BE HELD FOR 5 DAYS BEFORE ISSUING A FINAL NEGATIVE REPORT     Performed at [facility]   Report Status PENDING   Incomplete  CULTURE, BLOOD (ROUTINE X 2)     Status: None   Collection  Time    07/16/13  3:30 PM      Result Value Range Status   Specimen Description BLOOD RIGHT FOREARM   Final   Special Requests BOTTLES DRAWN AEROBIC AND ANAEROBIC 5CC   Final   Culture  Setup Time     Final   Value: 07/16/2013 21:58     Performed at [facility]   Culture     Final   Value:        BLOOD CULTURE RECEIVED NO GROWTH TO DATE CULTURE WILL BE HELD FOR 5 DAYS BEFORE ISSUING A FINAL NEGATIVE REPORT     Performed at [facility]   Report Status PENDING   Incomplete      Scheduled Meds: . amLODipine  10 mg Oral Daily  . aspirin EC  81 mg Oral Daily  . atorvastatin  80 mg Oral q1800  . fluticasone  2 spray Each Nare Daily  . furosemide  80 mg Oral BID  . gabapentin  800 mg Oral QID  . metoprolol tartrate  75 mg Oral BID  . morphine  60 mg Oral BID  . multivitamin with minerals  1 tablet Oral Daily  . potassium chloride SA  20 mEq Oral BID  . potassium chloride  20 mEq Oral BID  . saccharomyces boulardii  250 mg Oral TID  . Ticagrelor  90 mg Oral BID  . vancomycin  1,500 mg Intravenous Q24H  . vitamin B-12  1,000 mcg Oral Daily  . vitamin C  500 mg Oral Daily   Continuous Infusions:   Active Problems:   Chronic back pain   Thrombocytopenia   CAD (coronary artery disease) of artery bypass graft   Varicose veins of lower extremities with other complications   Cellulitis of left lower extremity   Chronic narcotic dependence  Birdie Hopes, MD [facility] Pager: [phone] 07/18/2013, 10:22 AM

## 2013-07-19 LAB — CBC
HCT: 39.3 % (ref 36.0–46.0)
MCH: 29.6 pg (ref 26.0–34.0)
MCHC: 33.1 g/dL (ref 30.0–36.0)
MCV: 89.5 fL (ref 78.0–100.0)
Platelets: 47 10*3/uL — ABNORMAL LOW (ref 150–400)
RBC: 4.39 MIL/uL (ref 3.87–5.11)
RDW: 14.3 % (ref 11.5–15.5)
WBC: 7.9 10*3/uL (ref 4.0–10.5)

## 2013-07-19 LAB — BASIC METABOLIC PANEL
BUN: 17 mg/dL (ref 6–23)
CO2: 28 mEq/L (ref 19–32)
Calcium: 8.8 mg/dL (ref 8.4–10.5)
Chloride: 99 mEq/L (ref 96–112)
Creatinine, Ser: 0.94 mg/dL (ref 0.50–1.10)
GFR calc Af Amer: 65 mL/min — ABNORMAL LOW (ref >90)

## 2013-07-19 LAB — MRSA PCR SCREENING: MRSA by PCR: NEGATIVE

## 2013-07-19 MED ORDER — POTASSIUM CHLORIDE CRYS ER 20 MEQ PO TBCR
60.0000 meq | EXTENDED_RELEASE_TABLET | Freq: Four times a day (QID) | ORAL | Status: AC
  Administered 2013-07-19 (×2): 60 meq via ORAL
  Filled 2013-07-19 (×2): qty 3

## 2013-07-19 NOTE — Progress Notes (Addendum)
ANTIBIOTIC CONSULT NOTE - FOLLOW UP  Pharmacy Consult for Vancomycin Indication: cellulitis  No Known Allergies  Patient Measurements: Height: _0  (162.6 cm) Weight: 204 lb 12.9 oz (92.9 kg) IBW/kg (Calculated) : 54.7   Vital Signs: Temp: 97.6 F (36.4 C) (11/02 0554) Temp src: Oral (11/02 0554) BP: 109/72 mmHg (11/02 0554) Pulse Rate: 103 (11/02 0554) Intake/Output from previous day: 11/01 0701 - 11/02 0700 In: 1050 [P.O.:510; IV Piggyback:500] Out: 1400 [Urine:1400] Intake/Output from this shift:    Labs:  Recent Labs  07/17/13 0533 07/18/13 0550 07/19/13 0545  WBC 12.1* 8.4 7.9  HGB 13.0 13.1 13.0  PLT 23* 33* 47*  CREATININE 0.91 0.99 0.94   Estimated Creatinine Clearance: 52.7 ml/min (by C-G formula based on Cr of 0.94). No results found for this basename: VANCOTROUGH, VANCOPEAK, VANCORANDOM, GENTTROUGH, GENTPEAK, GENTRANDOM, TOBRATROUGH, TOBRAPEAK, TOBRARND, AMIKACINPEAK, AMIKACINTROU, AMIKACIN,  in the last 72 hours   Microbiology: Recent Results (from the past 720 hour(s))  CULTURE, BLOOD (ROUTINE X 2)     Status: None   Collection Time    07/16/13  1:50 PM      Result Value Range Status   Specimen Description BLOOD LEFT ANTECUBITAL   Final   Special Requests BOTTLES DRAWN AEROBIC AND ANAEROBIC 5CC   Final   Culture  Setup Time     Final   Value: 07/16/2013 21:58     Performed at [facility]   Culture     Final   Value:        BLOOD CULTURE RECEIVED NO GROWTH TO DATE CULTURE WILL BE HELD FOR 5 DAYS BEFORE ISSUING A FINAL NEGATIVE REPORT     Performed at [facility]   Report Status PENDING   Incomplete  CULTURE, BLOOD (ROUTINE X 2)     Status: None   Collection Time    07/16/13  3:30 PM      Result Value Range Status   Specimen Description BLOOD RIGHT FOREARM   Final   Special Requests BOTTLES DRAWN AEROBIC AND ANAEROBIC 5CC   Final   Culture  Setup Time     Final   Value: 07/16/2013 21:58     Performed at [facility]    Culture     Final   Value:        BLOOD CULTURE RECEIVED NO GROWTH TO DATE CULTURE WILL BE HELD FOR 5 DAYS BEFORE ISSUING A FINAL NEGATIVE REPORT     Performed at [facility]   Report Status PENDING   Incomplete  MRSA PCR SCREENING     Status: None   Collection Time    07/19/13  2:16 AM      Result Value Range Status   MRSA by PCR NEGATIVE  NEGATIVE Final   Comment:            The GeneXpert MRSA Assay (FDA     approved for NASAL specimens     only), is one component of a     comprehensive MRSA colonization     surveillance program. It is not     intended to diagnose MRSA     infection nor to guide or     monitor treatment for     MRSA infections.    Anti-infectives   Start     Dose/Rate Route Frequency Ordered Stop   07/16/13 1400  vancomycin (VANCOCIN) 1,500 mg in sodium chloride 0.9 % 500 mL IVPB     1,500 mg 250 mL/hr  over 120 Minutes Intravenous Every 24 hours 07/16/13 1306        Assessment: 79 year old female with a history of venous insufficiency, multiple back surgeries, and chronic pain from [facility] with erythema, edema, and pain due to cellulitis which extends up her left leg and ends at her hip.  Infectious Disease: cellulitis d/t venous stasis. Was on Augmentin pta but worsened. Wound care RN saw then s/o. [employer], WBC 12.1>8.4>7.9  10/30 Vanc>>  10/30 BCx2>>  Goal of Therapy:  Vancomycin trough level 10-15 mcg/ml  Plan:  - Continue Vancomycin 1500 mg IV Q 24 hours, dose #4 today, follow up cultures. Redness seems to be receeding. - Will draw trough today before the 4th dose. - Follow up fever trend, scr, cultures. - Watch PLTC (on ASA 81 & Brilinta pta) Lovenox currently on hold - Note done 11/2  Thank you for allowing me to take part in the care of this patient,  Ollen Gross B. Leitha Schuller, PharmD Clinical Pharmacist - Resident Pager: [phone] Phone: [phone] 07/19/2013 8:46 AM   ------ Addendum 11/2 VT 1330. Dose is appropriate. Will  continue at this dose.  Burna Cash

## 2013-07-19 NOTE — Progress Notes (Signed)
[facility] PROGRESS NOTE  [name] [name] IZX:[id number] DOB: 1931/06/21 DOA: 07/16/2013 PCP: Mathews Argyle, MD  Subjective: Denies fever and chills, feels much better today.  HPI: [name] [name] is a 79 y.o. female with a history of chronic venous insufficiency, multiple back surgeries, ITP, CAD, and chronic pain who presents today from [facility] with erythema, edema, and pain due to cellulitis which extends up her left leg and ends at her hip. This was treated with Augmentin as an outpatient, but continued to become worse. Venous duplex was done prior to admission and was negative for DVT.   Assessment/Plan:  Left leg Cellulitis (acute portion is over the left thigh and hip.)  -Redness receeding. -blood cultures ordered, NGTD  -IV vancomycin per pharmacy  -Overall improving, likely to be discharged in a.m.  Venous Stasis  -elevate both legs  -wound care consult for ulcerations - recommendations made. -left LE doppler US completed outpatient -no clots found     Back Pain  -History of multiple back surgeries -MS contin 176m x2 daily. Will decrease to 60 mg bid and see how she does.  -Oxycodone 263mq 4 prn.  (Increased from 10 mg q 4 prn, back to "prior to admission dose of 20 mg q 4 prn"  Irregular Heartbeat  -stable  -EKG  Chronic Idiopathic Thrombocytopenia - Platelets 23,000 - Platelets were 18,000 in 12/2012 - Platelet appears to be chronically low, stable. Check CBC in AM.  Hypertension  -recent BP 104/73-controlled on amlodipine hold if systolic BP 10373r less  DVT Prophylaxis - Enoxaparin discontinued as platelets are 23.  Code Status: full Family Communication: none Disposition Plan:inpatient, likely to be discharged in a.m. needs PT/OT evaluation.   Consultants:  Wound care  Procedures:  none  Antibiotics: IV Vancomycin    Objective: Filed Vitals:   07/19/13 0924  BP: 130/80  Pulse: 90  Temp:   Resp:      Intake/Output Summary (Last 24 hours) at 07/19/13 1148 Last data filed at 07/19/13 1036  Gross per 24 hour  Intake   1360 ml  Output   1600 ml  Net   -240 ml   Filed Weights   07/17/13 0456 07/18/13 0600 07/19/13 0444  Weight: 92.4 kg (203 lb 11.3 oz) 90 kg (198 lb 6.6 oz) 92.9 kg (204 lb 12.9 oz)    Exam:  General:  Well developed, well nourished, a bit confused Cardiovascular: irregular, rate controlled, No m/r/g Respiratory: lungs bilaterally clear with good air movement Abdomen: soft, non-tender, non-distended, + bowel sounds Musculoskeletal: able to move all extremities, LL bilateral edema Skin: erythema (L and R) and ulcerations (L) of lower extremities. Extensive but light erythema going up the left leg to just before the hip. Area smaller than yesterday.  Data Reviewed: Basic Metabolic Panel:  Recent Labs Lab 07/16/13 1350 07/17/13 0533 07/18/13 0550 07/19/13 0545  NA 139 139 137 136  K 3.3* 3.4* 3.7 3.3*  CL 98 102 100 99  CO2 _0 GLUCOSE 82 112* 100* 103*  BUN _1 CREATININE 0.97 0.91 0.99 0.94  CALCIUM 9.7 8.9 9.1 8.8   Liver Function Tests:  Recent Labs Lab 07/16/13 1350  AST 11  ALT 6  ALKPHOS 138*  BILITOT 0.7  PROT 8.5*  ALBUMIN 3.5   CBC:  Recent Labs Lab 07/17/13 0533 07/18/13 0550 07/19/13 0545  WBC 12.1* 8.4 7.9  HGB 13.0 13.1 13.0  HCT 39.1 40.2 39.3  MCV  89.1 89.9 89.5  PLT 23* 33* 47*     Recent Results (from the past 240 hour(s))  CULTURE, BLOOD (ROUTINE X 2)     Status: None   Collection Time    07/16/13  1:50 PM      Result Value Range Status   Specimen Description BLOOD LEFT ANTECUBITAL   Final   Special Requests BOTTLES DRAWN AEROBIC AND ANAEROBIC 5CC   Final   Culture  Setup Time     Final   Value: 07/16/2013 21:58     Performed at [facility]   Culture     Final   Value:        BLOOD CULTURE RECEIVED NO GROWTH TO DATE CULTURE WILL BE HELD FOR 5 DAYS BEFORE ISSUING A FINAL  NEGATIVE REPORT     Performed at [facility]   Report Status PENDING   Incomplete  CULTURE, BLOOD (ROUTINE X 2)     Status: None   Collection Time    07/16/13  3:30 PM      Result Value Range Status   Specimen Description BLOOD RIGHT FOREARM   Final   Special Requests BOTTLES DRAWN AEROBIC AND ANAEROBIC 5CC   Final   Culture  Setup Time     Final   Value: 07/16/2013 21:58     Performed at [facility]   Culture     Final   Value:        BLOOD CULTURE RECEIVED NO GROWTH TO DATE CULTURE WILL BE HELD FOR 5 DAYS BEFORE ISSUING A FINAL NEGATIVE REPORT     Performed at [facility]   Report Status PENDING   Incomplete  MRSA PCR SCREENING     Status: None   Collection Time    07/19/13  2:16 AM      Result Value Range Status   MRSA by PCR NEGATIVE  NEGATIVE Final   Comment:            The GeneXpert MRSA Assay (FDA     approved for NASAL specimens     only), is one component of a     comprehensive MRSA colonization     surveillance program. It is not     intended to diagnose MRSA     infection nor to guide or     monitor treatment for     MRSA infections.      Scheduled Meds: . amLODipine  10 mg Oral Daily  . aspirin EC  81 mg Oral Daily  . atorvastatin  80 mg Oral q1800  . fluticasone  2 spray Each Nare Daily  . furosemide  80 mg Intravenous BID  . gabapentin  800 mg Oral QID  . metoprolol tartrate  75 mg Oral BID  . morphine  60 mg Oral BID  . multivitamin with minerals  1 tablet Oral Daily  . potassium chloride  60 mEq Oral Q6H  . saccharomyces boulardii  250 mg Oral TID  . Ticagrelor  90 mg Oral BID  . vancomycin  1,500 mg Intravenous Q24H  . vitamin B-12  1,000 mcg Oral Daily  . vitamin C  500 mg Oral Daily   Continuous Infusions:   Active Problems:   Chronic back pain   Thrombocytopenia   CAD (coronary artery disease) of artery bypass graft   Varicose veins of lower extremities with other complications   Cellulitis of left lower  extremity   Chronic narcotic dependence  [name] Tester A [name] Wassenaar,  MD [facility] Pager: [phone] 07/19/2013, 11:48 AM

## 2013-07-19 NOTE — Evaluation (Signed)
Physical Therapy Evaluation Patient Details Name: [name] [name] MRN: [id number] DOB: [birth year] Today's Date: 07/19/2013 Time: 0174-9449 PT Time Calculation (min): 47 min  PT Assessment / Plan / Recommendation History of Present Illness  79 y.o. female admitted to [facility] on 07/16/13 with a history of chronic venous insufficiency, multiple back surgeries, and chronic pain who presents today from [facility] with erythema, edema, and pain due to cellulitis which extends up her left leg and ends at her hip.  This was treated with Augmentin as an outpatient, but continued to become worse.  Clinical Impression  Pt is moving at her baseline level of functioning which is basically transfers into and out of bed.  She is able to manage her own peri care and even her depends without assist.  I was there for supervision for safety.  I do recommend she return to [facility] (where she was PTA) upon d/c.  PT to sign off as pt is at her normal baseline level of functioning.    PT Assessment  Patent does not need any further PT services;All further PT needs can be met in the next venue of care    Follow Up Recommendations  SNF    Does the patient have the potential to tolerate intense rehabilitation     NA  Barriers to Discharge   None      Equipment Recommendations  None recommended by PT    Recommendations for Other Services   None  Frequency   NA- one time eval   Precautions / Restrictions Precautions Precautions: Fall   Pertinent Vitals/Pain See vitals flow sheet.       Mobility  Bed Mobility Bed Mobility: Supine to Sit;Sitting - Scoot to Edge of Bed;Sit to Supine Supine to Sit: 4: Min assist;With rails;HOB elevated Sitting - Scoot to Edge of Bed: With rail Sit to Supine: 4: Min assist;Not Tested (comment);HOB flat Details for Bed Mobility Assistance: min assist to progress legs into and out of bed.  Once sitting, pt uses railing to scoot.   Transfers Transfers: Sit to  Stand;Stand to [employer] Transfers Sit to Stand: 5: Supervision;With upper extremity assist;With armrests;From bed;From chair/3-in-1 Stand to Sit: 5: Supervision;With armrests;To bed;To chair/3-in-1 Stand Pivot Transfers: 5: Supervision;With armrests Details for Transfer Assistance: As long as pt has something to stabilize herself with her upper extremities, she only needs supervision for safety during transfers.  She moves very slowly, but does get there if given time and space.   Ambulation/Gait Ambulation/Gait Assistance: 5: Supervision Ambulation Distance (Feet): 5 Feet Assistive device: None Ambulation/Gait Assistance Details: pt reaching for external railings to take 3-5 steps from recliner chair to bed that is ~5' away.  She is able to do so, but heavily relies on her upper extremities for support for balance.   Gait Pattern: Step-to pattern;Shuffle;Trunk flexed Gait velocity: decreased        PT Diagnosis: Difficulty walking;Abnormality of gait;Generalized weakness  PT Problem List: Decreased strength;Decreased activity tolerance;Decreased balance;Decreased mobility;Pain    PT Goals(Current goals can be found in the care plan section) Acute Rehab PT Goals Patient Stated Goal: to go back to [facility] ASAP PT Goal Formulation: No goals set, d/c therapy  Visit Information  Last PT Received On: 07/19/13 Assistance Needed: +1 History of Present Illness: 79 y.o. female admitted to [facility] on 07/16/13 with a history of chronic venous insufficiency, multiple back surgeries, and chronic pain who presents today from [facility] with erythema, edema, and pain due to  cellulitis which extends up her left leg and ends at her hip.  This was treated with Augmentin as an outpatient, but continued to become worse.       Prior [place] expects to be discharged to:: Skilled nursing facility [facility]) Prior Function Level of Independence: Independent  with assistive device(s) Comments: Per pt report she gets into and out of her WC on her own, preforms her own tolieting, peri care.   Communication Communication: No difficulties Dominant Hand: Right    Cognition  Cognition Arousal/Alertness: Awake/alert Behavior During Therapy: WFL for tasks assessed/performed Overall Cognitive Status: Impaired/Different from baseline Memory: Decreased short-term memory    Extremity/Trunk Assessment Upper Extremity Assessment Upper Extremity Assessment: Defer to OT evaluation Lower Extremity Assessment Lower Extremity Assessment: Generalized weakness Cervical / Trunk Assessment Cervical / Trunk Assessment: Other exceptions Cervical / Trunk Exceptions: in standing pt is flexed ~80 degrees at hips and trunk and is unable to staighten up       End of Session PT - End of Session Activity Tolerance: Patient limited by pain Patient left: in bed;with call bell/phone within reach   [name] [name], PT, DPT [phone]   07/19/2013, 3:40 PM

## 2013-07-20 LAB — BASIC METABOLIC PANEL
Calcium: 9 mg/dL (ref 8.4–10.5)
Creatinine, Ser: 0.94 mg/dL (ref 0.50–1.10)
GFR calc Af Amer: 65 mL/min — ABNORMAL LOW (ref >90)
GFR calc non Af Amer: 56 mL/min — ABNORMAL LOW (ref >90)

## 2013-07-20 LAB — CBC
Hemoglobin: 13.4 g/dL (ref 12.0–15.0)
MCH: 29.6 pg (ref 26.0–34.0)
MCHC: 33.3 g/dL (ref 30.0–36.0)
MCV: 88.9 fL (ref 78.0–100.0)
Platelets: 60 10*3/uL — ABNORMAL LOW (ref 150–400)
RDW: 14.2 % (ref 11.5–15.5)
WBC: 8.1 10*3/uL (ref 4.0–10.5)

## 2013-07-20 MED ORDER — MORPHINE SULFATE ER 60 MG PO TBCR: 60.0000 mg | EXTENDED_RELEASE_TABLET | Freq: Two times a day (BID) | ORAL | Status: DC

## 2013-07-20 MED ORDER — FUROSEMIDE 80 MG PO TABS
80.0000 mg | ORAL_TABLET | Freq: Two times a day (BID) | ORAL | Status: DC
  Administered 2013-07-20: 08:00:00 80 mg via ORAL
  Filled 2013-07-20 (×3): qty 1

## 2013-07-20 MED ORDER — OXYCODONE HCL 20 MG PO TABS: 20.0000 mg | ORAL_TABLET | Freq: Four times a day (QID) | ORAL | Status: DC | PRN

## 2013-07-20 MED ORDER — SULFAMETHOXAZOLE-TMP DS 800-160 MG PO TABS: 1.0000 | ORAL_TABLET | Freq: Two times a day (BID) | ORAL | Status: DC

## 2013-07-20 NOTE — Discharge Summary (Signed)
Addendum  Patient seen and examined, chart and data base reviewed.  I agree with the above assessment and plan.  For full details please see Mrs. Imogene Burn PA note.  Presented with Left lowe extremity cellulitis, treated with vancomycin, discharge on Bactrim DS.   Birdie Hopes, MD [facility] Pager: [phone] 07/20/2013, 1:27 PM

## 2013-07-20 NOTE — Discharge Summary (Signed)
Physician Discharge Summary  [name] [name] OIZ:[id number] DOB: 01-Nov-1930 DOA: 07/16/2013  PCP: Mathews Argyle, MD  Admit date: 07/16/2013 Discharge date: 07/20/2013  Time spent: 45 minutes  Recommendations for Outpatient Follow-up:  1. Continue to take po abx for cellulitis 2. Elevate legs.  Continue wound care for ulcerations 3. Check a bmet on 07/27/2013   Discharge Diagnoses:  Active Problems:   Chronic back pain   Thrombocytopenia   CAD (coronary artery disease) of artery bypass graft   Varicose veins of lower extremities with other complications   Cellulitis of left lower extremity   Chronic narcotic dependence   Discharge Condition: stable.  Cellulitis improving  Diet recommendation: heart healthy  Filed Weights   07/17/13 0456 07/18/13 0600 07/19/13 0444  Weight: 92.4 kg (203 lb 11.3 oz) 90 kg (198 lb 6.6 oz) 92.9 kg (204 lb 12.9 oz)    History of present illness:  [name] [name] is a 79 y.o. female with a history of chronic venous insufficiency, multiple back surgeries, ITP, CAD, and chronic pain who presents today from [facility] SNF with erythema, edema, and pain due to cellulitis which extends up her left leg and ends at her hip. This was treated with Augmentin as an outpatient, but continued to become worse. Venous duplex was done prior to admission and was negative for DVT.    Hospital Course:   Left leg Cellulitis (acute portion is over the left thigh and hip.)  -Redness resolved, still chronic damage from venous stasis  -blood cultures ordered-day 4 no growth -switch to po antibiotics for a total of 10 days of treatment.  Venous Stasis  -elevate both legs -wound care consult for ulcerations - recommendations made.  -left LE doppler US completed outpatient -no clots found   Back Pain  -History of multiple back surgeries  -MS contin 60 mg bid.  (decreased from previous dose.) -Oxycodone 48m q 4 prn.  Chronic Idiopathic Thrombocytopenia   - Platelets 23,000  - Platelets were 18,000 in 12/2012  - Platelet appears to be chronically low, stable. Platelets today 60,000  Hypertension  -controlled on amlodipine. Recent BP 124/79  Procedures: -none  Consultations:  Wound care  Discharge Exam: Filed Vitals:   07/20/13 0931  BP: 124/79  Pulse: 114  Temp:   Resp: 18    General: well developed, well nourished Cardiovascular: RRR, no r,m,g Respiratory: clear bilaterally in all fields Abdomen:  Obese, soft, nd, nt.  No masses Skin: acute cellulitis resolved, still chronic redness (L and R) and ulceration (L) present.    Discharge Instructions      Discharge Orders   Future Orders Complete By Expires   Diet - low sodium heart healthy  As directed    Increase activity slowly  As directed        Medication List    STOP taking these medications       amoxicillin-clavulanate 875-125 MG per tablet  Commonly known as:  AUGMENTIN     glucosamine-chondroitin 500-400 MG tablet      TAKE these medications       acetaminophen 325 MG tablet  Commonly known as:  TYLENOL  Take 650 mg by mouth every 4 (four) hours as needed for pain. To stop 07/21/13     ACIDOPHILUS/CITRUS PECTIN Tabs  Take 1 tablet by mouth daily.     amLODipine 10 MG tablet  Commonly known as:  NORVASC  Take 10 mg by mouth daily.     aspirin 81 MG chewable tablet  Chew 81 mg by mouth daily.     atorvastatin 80 MG tablet  Commonly known as:  LIPITOR  Take 80 mg by mouth daily. RX is written for 6pm. Nursing Home administers at [facility] 600/D PO  Take 2 tablets by mouth daily.     collagenase ointment  Commonly known as:  SANTYL  Apply 1 application topically as needed (dressing changes).     fluticasone 50 MCG/ACT nasal spray  Commonly known as:  FLONASE  Place 2 sprays into the nose daily.     furosemide 80 MG tablet  Commonly known as:  LASIX  Take 80 mg by mouth 2 (two) times daily.     gabapentin 800 MG tablet   Commonly known as:  NEURONTIN  Take 800 mg by mouth 4 (four) times daily.     metoprolol tartrate 25 MG tablet  Commonly known as:  LOPRESSOR  Take 75 mg by mouth 2 (two) times daily.     mineral oil-hydrophilic petrolatum ointment  Apply 1 application topically as needed for dry skin. knees     morphine 60 MG 12 hr tablet  Commonly known as:  MS CONTIN  Take 1 tablet (60 mg total) by mouth 2 (two) times daily.     mupirocin ointment 2 %  Commonly known as:  BACTROBAN  Apply 1 application topically as needed (with dressing changes).     ondansetron 4 MG tablet  Commonly known as:  ZOFRAN  Take 4 mg by mouth every 8 (eight) hours as needed for nausea.     Oxycodone HCl 20 MG Tabs  Take 1 tablet (20 mg total) by mouth every 6 (six) hours as needed (pain).     potassium chloride SA 20 MEQ tablet  Commonly known as:  K-DUR,KLOR-CON  Take 20 mEq by mouth 2 (two) times daily.     REMEDY NUTRASHIELD 1 % cream  Generic drug:  dimethicone  Apply 1 application topically 2 (two) times daily. To heels and legs     saccharomyces boulardii 250 MG capsule  Commonly known as:  FLORASTOR  Take 250 mg by mouth 3 (three) times daily.     sulfamethoxazole-trimethoprim 800-160 MG per tablet  Commonly known as:  BACTRIM DS  Take 1 tablet by mouth 2 (two) times daily.     SYSTANE OP  Place 1 drop into both eyes 2 (two) times daily as needed (dry eyes).     Ticagrelor 90 MG Tabs tablet  Commonly known as:  BRILINTA  Take 1 tablet (90 mg total) by mouth 2 (two) times daily.     TRIXAICIN 0.025 % cream  Generic drug:  capsicum oleoresin  Apply 1 application topically 4 (four) times daily. To hip       No Known Allergies    The results of significant diagnostics from this hospitalization (including imaging, microbiology, ancillary and laboratory) are listed below for reference.    Significant Diagnostic Studies: No results found.  Microbiology: Recent Results (from the past 240  hour(s))  CULTURE, BLOOD (ROUTINE X 2)     Status: None   Collection Time    07/16/13  1:50 PM      Result Value Range Status   Specimen Description BLOOD LEFT ANTECUBITAL   Final   Special Requests BOTTLES DRAWN AEROBIC AND ANAEROBIC 5CC   Final   Culture  Setup Time     Final   Value: 07/16/2013 21:58     Performed at  [facility]     Final   Value:        BLOOD CULTURE RECEIVED NO GROWTH TO DATE CULTURE WILL BE HELD FOR 5 DAYS BEFORE ISSUING A FINAL NEGATIVE REPORT     Performed at [facility]   Report Status PENDING   Incomplete  CULTURE, BLOOD (ROUTINE X 2)     Status: None   Collection Time    07/16/13  3:30 PM      Result Value Range Status   Specimen Description BLOOD RIGHT FOREARM   Final   Special Requests BOTTLES DRAWN AEROBIC AND ANAEROBIC 5CC   Final   Culture  Setup Time     Final   Value: 07/16/2013 21:58     Performed at [facility]   Culture     Final   Value:        BLOOD CULTURE RECEIVED NO GROWTH TO DATE CULTURE WILL BE HELD FOR 5 DAYS BEFORE ISSUING A FINAL NEGATIVE REPORT     Performed at [facility]   Report Status PENDING   Incomplete  MRSA PCR SCREENING     Status: None   Collection Time    07/19/13  2:16 AM      Result Value Range Status   MRSA by PCR NEGATIVE  NEGATIVE Final   Comment:            The GeneXpert MRSA Assay (FDA     approved for NASAL specimens     only), is one component of a     comprehensive MRSA colonization     surveillance program. It is not     intended to diagnose MRSA     infection nor to guide or     monitor treatment for     MRSA infections.     Labs: Basic Metabolic Panel:  Recent Labs Lab 07/16/13 1350 07/17/13 0533 07/18/13 0550 07/19/13 0545 07/20/13 0538  NA 139 139 137 136 139  K 3.3* 3.4* 3.7 3.3* 3.7  CL 98 102 100 99 101  CO2 _0 GLUCOSE 82 112* 100* 103* 100*  BUN _1 CREATININE 0.97 0.91 0.99 0.94 0.94  CALCIUM 9.7 8.9 9.1 8.8  9.0   Liver Function Tests:  Recent Labs Lab 07/16/13 1350  AST 11  ALT 6  ALKPHOS 138*  BILITOT 0.7  PROT 8.5*  ALBUMIN 3.5   CBC:  Recent Labs Lab 07/17/13 0533 07/18/13 0550 07/19/13 0545 07/20/13 0538  WBC 12.1* 8.4 7.9 8.1  HGB 13.0 13.1 13.0 13.4  HCT 39.1 40.2 39.3 40.2  MCV 89.1 89.9 89.5 88.9  PLT 23* 33* 47* 60*      Signed:  Mliss Sax, [place] [phone] [facility] 07/20/2013, 11:57 AM

## 2013-07-20 NOTE — Progress Notes (Signed)
Clinical Social Work Department BRIEF PSYCHOSOCIAL ASSESSMENT 07/20/2013  Patient:  [name] [name], [name] [name]     Account Number:  [id number]     Admit date:  07/16/2013  Clinical Social Worker:  Adair Laundry  Date/Time:  07/20/2013 11:00 AM  Referred by:  Physician  Date Referred:  07/20/2013 Referred for  SNF Placement   Other Referral:   Interview type:  Patient Other interview type:    PSYCHOSOCIAL DATA Living Status:  FACILITY Admitted from facility:  [facility] Level of care:  [facility] Primary support name:  Kelty Szafran [phone] Primary support relationship to patient:  CHILD, ADULT Degree of support available:   Pt has supportive family    CURRENT CONCERNS Current Concerns  Post-Acute Placement   Other Concerns:    SOCIAL WORK ASSESSMENT / PLAN CSW informed that pt was admitted from [facility]. CSW spoke with pt and plan is to return to facility. CSW spoke with pt about transportation. Pt stated she would like to go back by car, which is how she was brought to the hospital. CSW called facility to see if they would be able to pick up pt. Pt will be picked up at 3pm. CSW to inform pt nurse.   Assessment/plan status:  Psychosocial Support/Ongoing Assessment of Needs Other assessment/ plan:   Information/referral to community resources:   None needed    PATIENT'S/FAMILY'S RESPONSE TO PLAN OF CARE: Pt is agreeable to return to [facility].       [place], [place]

## 2013-07-20 NOTE — Progress Notes (Signed)
CSW (Clinical Education officer, museum) prepared pt dc packet and placed with shadow chart. [facility] by facility. Pt nurse aware. CSW signing off.  [place], [place]

## 2013-07-20 NOTE — Progress Notes (Signed)
OT Cancellation Note  Patient Details Name: [name] [name] MRN: [id number] DOB: 1931-01-17   Cancelled Treatment:    Reason Eval/Treat Not Completed: OT screened, no needs identified, will sign off Pt apparently at baseline. Defer to SNF. [place], OTR/L  [phone] 07/20/2013 07/20/2013, 10:55 AM

## 2013-07-20 NOTE — Care Management Note (Signed)
    Page 1 of 1   07/20/2013     3:14:35 PM   CARE MANAGEMENT NOTE 07/20/2013  Patient:  [name] [name], [name] [name]   Account Number:  [id number]  Date Initiated:  07/20/2013  Documentation initiated by:  Tomi Bamberger  Subjective/Objective Assessment:   dx varicose veins ,cellulitis  admit- from [facility]     Action/Plan:   Anticipated DC Date:  07/20/2013   Anticipated DC Plan:  [place]  In-house referral  Clinical Social Worker      DC Planning Services  CM consult      Choice offered to / List presented to:             Status of service:  Completed, signed off Medicare Important Message given?   (If response is "NO", the following Medicare IM given date fields will be blank) Date Medicare IM given:   Date Additional Medicare IM given:    Discharge Disposition:  [place]  Per UR Regulation:  Reviewed for med. necessity/level of care/duration of stay  If discussed at [place] of Stay Meetings, dates discussed:    Comments:

## 2013-07-20 NOTE — Progress Notes (Signed)
Report called to [facility] SN[facility] at 1434.

## 2013-07-20 NOTE — Progress Notes (Signed)
Pt prepared for d/c to SNF. IV d/c'd. Skin intact except as most recently charted. Vitals are stable. Report called to receiving facility by student nurse. Pt to be transported by receiving facility transporter, to be escorted out by nursing student.

## 2013-07-22 LAB — CULTURE, BLOOD (ROUTINE X 2)
Culture: NO GROWTH
Culture: NO GROWTH

## 2013-07-23 ENCOUNTER — Other Ambulatory Visit: Payer: Self-pay

## 2013-08-09 ENCOUNTER — Emergency Department (HOSPITAL_COMMUNITY): Payer: Medicare Other

## 2013-08-09 ENCOUNTER — Encounter (HOSPITAL_COMMUNITY): Payer: Self-pay | Admitting: Emergency Medicine

## 2013-08-09 ENCOUNTER — Inpatient Hospital Stay (HOSPITAL_COMMUNITY)
Admission: EM | Admit: 2013-08-09 | Discharge: 2013-08-18 | DRG: 871 | Disposition: A | Discharge status: Skilled Nursing Facility | Payer: Medicare Other | Attending: Family Medicine | Admitting: Family Medicine

## 2013-08-09 DIAGNOSIS — F1120 Opioid dependence, uncomplicated: Secondary | ICD-10-CM | POA: Diagnosis present

## 2013-08-09 DIAGNOSIS — G479 Sleep disorder, unspecified: Secondary | ICD-10-CM | POA: Diagnosis present

## 2013-08-09 DIAGNOSIS — Z66 Do not resuscitate: Secondary | ICD-10-CM | POA: Diagnosis present

## 2013-08-09 DIAGNOSIS — R609 Edema, unspecified: Secondary | ICD-10-CM

## 2013-08-09 DIAGNOSIS — I10 Essential (primary) hypertension: Secondary | ICD-10-CM | POA: Diagnosis present

## 2013-08-09 DIAGNOSIS — G8929 Other chronic pain: Secondary | ICD-10-CM | POA: Diagnosis present

## 2013-08-09 DIAGNOSIS — Z8249 Family history of ischemic heart disease and other diseases of the circulatory system: Secondary | ICD-10-CM

## 2013-08-09 DIAGNOSIS — Z951 Presence of aortocoronary bypass graft: Secondary | ICD-10-CM

## 2013-08-09 DIAGNOSIS — Z7982 Long term (current) use of aspirin: Secondary | ICD-10-CM

## 2013-08-09 DIAGNOSIS — L03119 Cellulitis of unspecified part of limb: Secondary | ICD-10-CM

## 2013-08-09 DIAGNOSIS — L0390 Cellulitis, unspecified: Secondary | ICD-10-CM

## 2013-08-09 DIAGNOSIS — I25810 Atherosclerosis of coronary artery bypass graft(s) without angina pectoris: Secondary | ICD-10-CM | POA: Diagnosis present

## 2013-08-09 DIAGNOSIS — R7881 Bacteremia: Secondary | ICD-10-CM

## 2013-08-09 DIAGNOSIS — L02419 Cutaneous abscess of limb, unspecified: Secondary | ICD-10-CM | POA: Diagnosis present

## 2013-08-09 DIAGNOSIS — F1920 Other psychoactive substance dependence, uncomplicated: Secondary | ICD-10-CM | POA: Diagnosis present

## 2013-08-09 DIAGNOSIS — I509 Heart failure, unspecified: Secondary | ICD-10-CM | POA: Diagnosis present

## 2013-08-09 DIAGNOSIS — I200 Unstable angina: Secondary | ICD-10-CM | POA: Diagnosis present

## 2013-08-09 DIAGNOSIS — I83893 Varicose veins of bilateral lower extremities with other complications: Secondary | ICD-10-CM

## 2013-08-09 DIAGNOSIS — I4891 Unspecified atrial fibrillation: Secondary | ICD-10-CM | POA: Diagnosis present

## 2013-08-09 DIAGNOSIS — L97309 Non-pressure chronic ulcer of unspecified ankle with unspecified severity: Secondary | ICD-10-CM | POA: Diagnosis present

## 2013-08-09 DIAGNOSIS — D72829 Elevated white blood cell count, unspecified: Secondary | ICD-10-CM | POA: Diagnosis present

## 2013-08-09 DIAGNOSIS — M899 Disorder of bone, unspecified: Secondary | ICD-10-CM | POA: Diagnosis present

## 2013-08-09 DIAGNOSIS — I5033 Acute on chronic diastolic (congestive) heart failure: Secondary | ICD-10-CM | POA: Diagnosis present

## 2013-08-09 DIAGNOSIS — R079 Chest pain, unspecified: Secondary | ICD-10-CM

## 2013-08-09 DIAGNOSIS — R072 Precordial pain: Secondary | ICD-10-CM

## 2013-08-09 DIAGNOSIS — D696 Thrombocytopenia, unspecified: Secondary | ICD-10-CM | POA: Diagnosis present

## 2013-08-09 DIAGNOSIS — E785 Hyperlipidemia, unspecified: Secondary | ICD-10-CM

## 2013-08-09 DIAGNOSIS — K810 Acute cholecystitis: Secondary | ICD-10-CM

## 2013-08-09 DIAGNOSIS — L03116 Cellulitis of left lower limb: Secondary | ICD-10-CM

## 2013-08-09 DIAGNOSIS — A419 Sepsis, unspecified organism: Principal | ICD-10-CM | POA: Diagnosis present

## 2013-08-09 DIAGNOSIS — E876 Hypokalemia: Secondary | ICD-10-CM | POA: Diagnosis present

## 2013-08-09 DIAGNOSIS — R5381 Other malaise: Secondary | ICD-10-CM | POA: Diagnosis present

## 2013-08-09 DIAGNOSIS — I252 Old myocardial infarction: Secondary | ICD-10-CM

## 2013-08-09 MED ORDER — DILTIAZEM HCL 100 MG IV SOLR
5.0000 mg/h | INTRAVENOUS | Status: DC
  Administered 2013-08-10: 10 mg/h via INTRAVENOUS
  Administered 2013-08-10 (×2): 15 mg/h via INTRAVENOUS

## 2013-08-09 MED ORDER — DILTIAZEM LOAD VIA INFUSION
10.0000 mg | Freq: Once | INTRAVENOUS | Status: AC
  Administered 2013-08-10: 10 mg via INTRAVENOUS
  Filled 2013-08-09: qty 10

## 2013-08-09 MED ORDER — SODIUM CHLORIDE 0.9 % IV SOLN
1000.0000 mL | INTRAVENOUS | Status: DC
  Administered 2013-08-10: 1000 mL via INTRAVENOUS

## 2013-08-09 NOTE — ED Notes (Signed)
Per EMS pt is brought in d/t low platelets found on routine blood work.  Pt is from [facility] home.

## 2013-08-09 NOTE — ED Provider Notes (Addendum)
CSN: [id number]     Arrival date & time 08/09/13  2215 History   First MD Initiated Contact with Patient 08/09/13 2301     Chief Complaint  Patient presents with  . Weakness    HPI Pt presents to the ED with complaints for general weakness that started last night.  She feels that she gradually became weak as the day progressed.  Usually she can walk on her own but she was not able to today.  She feels weak all over.  No trouble with her speech. SHe has some aching pain all over.    No complaints of vomiting or diarrhea.  No chest pain or abdominal pain. No blood in her stool.  No fever or cough. Past Medical History  Diagnosis Date  . HTN (hypertension)   . DJD (degenerative joint disease)   . Osteopenia   . Dyslipidemia   . Venous ulcer   . Thrombocytopenia   . CAD (coronary artery disease)     a. NSTEMI 2/14 => LHC 10/20/12:  dLM 20%, pLAD 99%, mLAD 60% followed by 99%, oD1 99%, and pD2 30%, mild plaque disease in the CFX and RCA. PCI: Xience Xpedition DES to the proximal and mid LAD  . Hx of echocardiogram     a. Echo 10/18/12: mod LVH, EF 60-65%, Gr 1 diast dysfn, mild AI, mild LAE.   Past Surgical History  Procedure Laterality Date  . Back surgery      Lumbar disc  . Hip surgery      R THR  . Eye surgery      Blepharoplasty  . Insertion / placement / revision neurostimulator    . Joint replacement     Family History  Problem Relation Age of Onset  . CAD Father 24    Vague history    History  Substance Use Topics  . Smoking status: Never Smoker   . Smokeless tobacco: Never Used  . Alcohol Use: No   OB History   Grav Para Term Preterm Abortions TAB SAB Ect Mult Living                 Review of Systems  All other systems reviewed and are negative.    Allergies  Review of patient's allergies indicates no known allergies.  Home Medications   Current Outpatient Rx  Name  Route  Sig  Dispense  Refill  . acetaminophen (TYLENOL) 325 MG tablet   Oral   Take 650  mg by mouth every 4 (four) hours as needed for pain. To stop 07/21/13         . amLODipine (NORVASC) 10 MG tablet   Oral   Take 10 mg by mouth daily.         Marland Kitchen aspirin 81 MG chewable tablet   Oral   Chew 81 mg by mouth daily.         Marland Kitchen atorvastatin (LIPITOR) 80 MG tablet   Oral   Take 80 mg by mouth daily. RX is written for 6pm. Nursing Home administers at 8am         . Calcium Carbonate-Vitamin D (CALCARB 600/D PO)   Oral   Take 2 tablets by mouth daily.         . capsicum oleoresin (TRIXAICIN) 0.025 % cream   Topical   Apply 1 application topically 4 (four) times daily. To hip         . collagenase (SANTYL) ointment   Topical   Apply  1 application topically as needed (dressing changes).         . dimethicone (REMEDY NUTRASHIELD) 1 % cream   Topical   Apply 1 application topically 2 (two) times daily. To heels and legs         . fluticasone (FLONASE) 50 MCG/ACT nasal spray   Nasal   Place 2 sprays into the nose daily.         . furosemide (LASIX) 80 MG tablet   Oral   Take 80 mg by mouth 2 (two) times daily.          Marland Kitchen gabapentin (NEURONTIN) 800 MG tablet   Oral   Take 800 mg by mouth 4 (four) times daily.         . Lactobacillus Acid-Pectin (ACIDOPHILUS/CITRUS PECTIN) TABS   Oral   Take 1 tablet by mouth daily.         . metoprolol tartrate (LOPRESSOR) 25 MG tablet   Oral   Take 75 mg by mouth 2 (two) times daily.         . mineral oil-hydrophilic petrolatum (AQUAPHOR) ointment   Topical   Apply 1 application topically as needed for dry skin. knees         . mupirocin ointment (BACTROBAN) 2 %   Topical   Apply 1 application topically as needed (with dressing changes).         . ondansetron (ZOFRAN) 4 MG tablet   Oral   Take 4 mg by mouth every 8 (eight) hours as needed for nausea.         . Oxycodone HCl 20 MG TABS   Oral   Take 1 tablet (20 mg total) by mouth every 6 (six) hours as needed (pain).   30 tablet   0   .  Polyethyl Glycol-Propyl Glycol (SYSTANE OP)   Both Eyes   Place 1 drop into both eyes 2 (two) times daily as needed (dry eyes).         . potassium chloride SA (K-DUR,KLOR-CON) 20 MEQ tablet   Oral   Take 20 mEq by mouth 2 (two) times daily.         . Ticagrelor (BRILINTA) 90 MG TABS tablet   Oral   Take 1 tablet (90 mg total) by mouth 2 (two) times daily.   60 tablet   0   . morphine (MS CONTIN) 30 MG 12 hr tablet   Oral   Take 30 mg by mouth every 12 (twelve) hours.         Marland Kitchen saccharomyces boulardii (FLORASTOR) 250 MG capsule   Oral   Take 250 mg by mouth 3 (three) times daily.          Marland Kitchen sulfamethoxazole-trimethoprim (BACTRIM DS) 800-160 MG per tablet   Oral   Take 1 tablet by mouth 2 (two) times daily.   12 tablet   0    BP 141/73  Pulse 137  Temp(Src) 100.9 F (38.3 C) (Rectal)  Resp 24  SpO2 94% Physical Exam  Nursing note and vitals reviewed. Constitutional: No distress.  Frail   HENT:  Head: Normocephalic and atraumatic.  Right Ear: External ear normal.  Left Ear: External ear normal.  Eyes: Conjunctivae are normal. Right eye exhibits no discharge. Left eye exhibits no discharge. No scleral icterus.  Neck: Neck supple. No tracheal deviation present.  Cardiovascular: Normal rate, regular rhythm and intact distal pulses.   Bounding heart sounds, extrasystoles   Pulmonary/Chest: Effort normal and breath  sounds normal. No stridor. No respiratory distress. She has no wheezes. She has no rales.  Abdominal: Soft. Bowel sounds are normal. She exhibits no distension. There is no tenderness. There is no rebound and no guarding.  Musculoskeletal: She exhibits edema and tenderness.  Erythema of lle, with edema, extends proximally towards medial aspect of left thigh  Neurological: She is alert. No sensory deficit. Cranial nerve deficit: no facial droop, no slurred speech. She exhibits normal muscle tone. She displays no seizure activity. Coordination normal.   General weakness, unable to lift either leg off the bed, able to wiggle toes, able to lift both arms off the bed  Skin: Skin is warm and dry. No rash noted.  Psychiatric: She has a normal mood and affect.    ED Course  Procedures (including critical care time) CRITICAL CARE Performed by: Dorie Rank R Total critical care time: 35 Critical care time was exclusive of separately billable procedures and treating other patients. Critical care was necessary to treat or prevent imminent or life-threatening deterioration. Critical care was time spent personally by me on the following activities: development of treatment plan with patient and/or surrogate as well as nursing, discussions with consultants, evaluation of patient's response to treatment, examination of patient, obtaining history from patient or surrogate, ordering and performing treatments and interventions, ordering and review of laboratory studies, ordering and review of radiographic studies, pulse oximetry and re-evaluation of patient's condition.  Labs Review Labs Reviewed  CBC WITH DIFFERENTIAL - Abnormal; Notable for the following:    WBC 23.4 (*)    Platelets 22 (*)    Neutrophils Relative % 93 (*)    Neutro Abs 21.7 (*)    Lymphocytes Relative 5 (*)    Monocytes Relative 2 (*)    All other components within normal limits  COMPREHENSIVE METABOLIC PANEL - Abnormal; Notable for the following:    Potassium 3.1 (*)    Glucose, Bld 146 (*)    BUN 28 (*)    Albumin 3.2 (*)    Alkaline Phosphatase 126 (*)    GFR calc non Af Amer 51 (*)    GFR calc Af Amer 59 (*)    All other components within normal limits  BLOOD GAS, ARTERIAL - Abnormal; Notable for the following:    pH, Arterial 7.517 (*)    pO2, Arterial 59.9 (*)    Bicarbonate 28.4 (*)    Acid-Base Excess 5.8 (*)    All other components within normal limits  CULTURE, BLOOD (SINGLE)  URINALYSIS, ROUTINE W REFLEX MICROSCOPIC  LACTIC ACID, PLASMA  AMMONIA  TYPE AND  SCREEN  ABO/RH   Imaging Review Dg Chest 2 View  08/09/2013   CLINICAL DATA:  Weakness and shortness of breath.  EXAM: CHEST  2 VIEW  COMPARISON:  12/20/2012  FINDINGS: Shallow inspiration. Borderline heart size and pulmonary vascularity may represent early congestive change. No evidence of edema or infiltration. Improving residual bilateral basilar atelectasis. No pneumothorax. Marked degenerative changes in the shoulders. Stable postoperative changes in the spine.  IMPRESSION: Improving bilateral basilar atelectasis. Cardiac enlargement with borderline pulmonary vascularity may represent early congestive change. No infiltration or edema.   Electronically Signed   By: Lucienne Capers M.D.   On: 08/09/2013 23:33    EKG Interpretation    Date/Time:  Sunday August 09 2013 23:36:32 EST Ventricular Rate:  146 PR Interval:    QRS Duration: 93 QT Interval:  301 QTC Calculation: 469 R Axis:   -22 Text Interpretation:  Atrial fibrillation  with rapid V-rate Multiple ventricular premature complexes Borderline left axis deviation Anterior infarct, old Nonspecific T abnormalities, lateral leads atrial fibrillation is new since last tracing Confirmed by Gwendoline Judy  MD-J, Daryon Remmert ([id number]) on 08/09/2013 11:58:36 PM           Medications  0.9 %  sodium chloride infusion (1,000 mLs Intravenous New Bag/Given 08/10/13 0052)  diltiazem (CARDIZEM) 1 mg/mL load via infusion 10 mg (10 mg Intravenous New Bag/Given 08/10/13 0116)    And  diltiazem (CARDIZEM) 100 mg in dextrose 5 % 100 mL infusion (10 mg/hr Intravenous New Bag/Given 08/10/13 0116)  piperacillin-tazobactam (ZOSYN) IVPB 3.375 g (3.375 g Intravenous New Bag/Given 08/10/13 0127)   0255  Still tachycardic.  Will given an additional 15 mg cardizem. Continue the infusion MDM   1. Cellulitis   2. Atrial fibrillation with rapid ventricular response     Patient appears to have cellulitis. She has fever, leukocytosis and erythema extending proximally in  her left thigh. I have ordered IV antibiotics. Patient be admitted to the hospital and monitored closely.  At this time, I doubt sepsis, her lactic acid level is normal. She is not hypotensive and her tachycardia is most likely related to her atrial fibrillation  The patient appears to have atrial fibrillation with rapid ventricular response. IV Cardizem drip was initiated in the emergency department. She still remains tachycardic, she will need to have her infusion rate increased as per her orders.  Patient admitted to the hospital for further treatment and evaluation.    Kathalene Frames, MD 08/10/13 6737  Kathalene Frames, MD 08/10/13 [phone]

## 2013-08-09 NOTE — ED Notes (Signed)
Bed: WA20 Expected date:  Expected time:  Means of arrival:  Comments: EMS 79 yo Weakness

## 2013-08-10 ENCOUNTER — Inpatient Hospital Stay (HOSPITAL_COMMUNITY): Payer: Medicare Other

## 2013-08-10 ENCOUNTER — Encounter (HOSPITAL_COMMUNITY): Payer: Self-pay | Admitting: Internal Medicine

## 2013-08-10 DIAGNOSIS — L0291 Cutaneous abscess, unspecified: Secondary | ICD-10-CM

## 2013-08-10 DIAGNOSIS — I4891 Unspecified atrial fibrillation: Secondary | ICD-10-CM

## 2013-08-10 DIAGNOSIS — I369 Nonrheumatic tricuspid valve disorder, unspecified: Secondary | ICD-10-CM

## 2013-08-10 DIAGNOSIS — D696 Thrombocytopenia, unspecified: Secondary | ICD-10-CM

## 2013-08-10 DIAGNOSIS — L02419 Cutaneous abscess of limb, unspecified: Secondary | ICD-10-CM

## 2013-08-10 DIAGNOSIS — A419 Sepsis, unspecified organism: Principal | ICD-10-CM

## 2013-08-10 LAB — CBC WITH DIFFERENTIAL/PLATELET
Basophils Absolute: 0 10*3/uL (ref 0.0–0.1)
Basophils Absolute: 0 10*3/uL (ref 0.0–0.1)
Basophils Relative: 0 % (ref 0–1)
Eosinophils Absolute: 0 10*3/uL (ref 0.0–0.7)
Eosinophils Relative: 0 % (ref 0–5)
Eosinophils Relative: 0 % (ref 0–5)
HCT: 38.1 % (ref 36.0–46.0)
Hemoglobin: 12.5 g/dL (ref 12.0–15.0)
Hemoglobin: 12.8 g/dL (ref 12.0–15.0)
Lymphocytes Relative: 5 % — ABNORMAL LOW (ref 12–46)
MCH: 29.3 pg (ref 26.0–34.0)
MCV: 89 fL (ref 78.0–100.0)
Monocytes Relative: 3 % (ref 3–12)
Neutro Abs: 21.7 10*3/uL — ABNORMAL HIGH (ref 1.7–7.7)
Neutrophils Relative %: 92 % — ABNORMAL HIGH (ref 43–77)
Neutrophils Relative %: 93 % — ABNORMAL HIGH (ref 43–77)
Platelets: 22 10*3/uL — CL (ref 150–400)
Platelets: 22 10*3/uL — CL (ref 150–400)
RBC: 4.26 MIL/uL (ref 3.87–5.11)
RDW: 15.5 % (ref 11.5–15.5)
WBC: 20.3 10*3/uL — ABNORMAL HIGH (ref 4.0–10.5)
WBC: 23.4 10*3/uL — ABNORMAL HIGH (ref 4.0–10.5)

## 2013-08-10 LAB — COMPREHENSIVE METABOLIC PANEL
ALT: 8 U/L (ref 0–35)
AST: 14 U/L (ref 0–37)
Albumin: 3.2 g/dL — ABNORMAL LOW (ref 3.5–5.2)
Albumin: 2.8 g/dL — ABNORMAL LOW (ref 3.5–5.2)
Alkaline Phosphatase: 126 U/L — ABNORMAL HIGH (ref 39–117)
CO2: 28 mEq/L (ref 19–32)
CO2: 28 mEq/L (ref 19–32)
Calcium: 9.5 mg/dL (ref 8.4–10.5)
Calcium: 9 mg/dL (ref 8.4–10.5)
Chloride: 101 mEq/L (ref 96–112)
Creatinine, Ser: 0.98 mg/dL (ref 0.50–1.10)
GFR calc Af Amer: 61 mL/min — ABNORMAL LOW (ref >90)
GFR calc non Af Amer: 51 mL/min — ABNORMAL LOW (ref >90)
GFR calc non Af Amer: 53 mL/min — ABNORMAL LOW (ref >90)
Glucose, Bld: 146 mg/dL — ABNORMAL HIGH (ref 70–99)
Glucose, Bld: 143 mg/dL — ABNORMAL HIGH (ref 70–99)
Potassium: 3.1 mEq/L — ABNORMAL LOW (ref 3.5–5.1)
Potassium: 3.1 mEq/L — ABNORMAL LOW (ref 3.5–5.1)
Sodium: 140 mEq/L (ref 135–145)
Total Bilirubin: 1.1 mg/dL (ref 0.3–1.2)

## 2013-08-10 LAB — URINALYSIS, ROUTINE W REFLEX MICROSCOPIC
Bilirubin Urine: NEGATIVE
Glucose, UA: NEGATIVE mg/dL
Nitrite: NEGATIVE
Specific Gravity, Urine: 1.022 (ref 1.005–1.030)
Urobilinogen, UA: 0.2 mg/dL (ref 0.0–1.0)
pH: 5.5 (ref 5.0–8.0)

## 2013-08-10 LAB — ABO/RH: ABO/RH(D): A POS

## 2013-08-10 LAB — SEDIMENTATION RATE: Sed Rate: 43 mm/hr — ABNORMAL HIGH (ref 0–22)

## 2013-08-10 LAB — LACTIC ACID, PLASMA: Lactic Acid, Venous: 1.5 mmol/L (ref 0.5–2.2)

## 2013-08-10 LAB — DIC (DISSEMINATED INTRAVASCULAR COAGULATION)PANEL
D-Dimer, Quant: 0.98 ug/mL-FEU — ABNORMAL HIGH (ref 0.00–0.48)
Fibrinogen: 661 mg/dL — ABNORMAL HIGH (ref 204–475)
INR: 1.47 (ref 0.00–1.49)
Platelets: 17 10*3/uL — CL (ref 150–400)
Prothrombin Time: 17.4 seconds — ABNORMAL HIGH (ref 11.6–15.2)

## 2013-08-10 LAB — BLOOD GAS, ARTERIAL
Acid-Base Excess: 5.8 mmol/L — ABNORMAL HIGH (ref 0.0–2.0)
Bicarbonate: 28.4 mEq/L — ABNORMAL HIGH (ref 20.0–24.0)
TCO2: 24.7 mmol/L (ref 0–100)
pCO2 arterial: 35.2 mmHg (ref 35.0–45.0)
pH, Arterial: 7.517 — ABNORMAL HIGH (ref 7.350–7.450)
pO2, Arterial: 59.9 mmHg — ABNORMAL LOW (ref 80.0–100.0)

## 2013-08-10 LAB — TROPONIN I: Troponin I: <0.3 ng/mL (ref <0.30)

## 2013-08-10 LAB — T4, FREE: Free T4: 1.07 ng/dL (ref 0.80–1.80)

## 2013-08-10 LAB — DIC (DISSEMINATED INTRAVASCULAR COAGULATION) PANEL: Smear Review: NONE SEEN

## 2013-08-10 LAB — CK: Total CK: 117 U/L (ref 7–177)

## 2013-08-10 LAB — TYPE AND SCREEN: Antibody Screen: NEGATIVE

## 2013-08-10 LAB — GLUCOSE, CAPILLARY: Glucose-Capillary: 183 mg/dL — ABNORMAL HIGH (ref 70–99)

## 2013-08-10 LAB — AMMONIA: Ammonia: 26 umol/L (ref 11–60)

## 2013-08-10 LAB — PRO B NATRIURETIC PEPTIDE: Pro B Natriuretic peptide (BNP): 2308 pg/mL — ABNORMAL HIGH (ref 0–450)

## 2013-08-10 MED ORDER — FLUTICASONE PROPIONATE 50 MCG/ACT NA SUSP
2.0000 | Freq: Every day | NASAL | Status: DC
  Administered 2013-08-12 – 2013-08-18 (×7): 2 via NASAL
  Filled 2013-08-10 (×2): qty 16

## 2013-08-10 MED ORDER — OXYCODONE HCL 5 MG PO TABS: 20.0000 mg | ORAL_TABLET | Freq: Four times a day (QID) | ORAL | Status: DC | PRN

## 2013-08-10 MED ORDER — SACCHAROMYCES BOULARDII 250 MG PO CAPS
250.0000 mg | ORAL_CAPSULE | Freq: Three times a day (TID) | ORAL | Status: DC
  Administered 2013-08-10 – 2013-08-18 (×25): 250 mg via ORAL
  Filled 2013-08-10 (×28): qty 1

## 2013-08-10 MED ORDER — TICAGRELOR 90 MG PO TABS
90.0000 mg | ORAL_TABLET | Freq: Two times a day (BID) | ORAL | Status: DC
  Administered 2013-08-10 – 2013-08-16 (×14): 90 mg via ORAL
  Filled 2013-08-10 (×16): qty 1

## 2013-08-10 MED ORDER — ONDANSETRON HCL 4 MG PO TABS: 4.0000 mg | ORAL_TABLET | Freq: Three times a day (TID) | ORAL | Status: DC | PRN

## 2013-08-10 MED ORDER — DILTIAZEM LOAD VIA INFUSION
15.0000 mg | Freq: Once | INTRAVENOUS | Status: AC
  Administered 2013-08-10: 15 mg via INTRAVENOUS
  Filled 2013-08-10: qty 15

## 2013-08-10 MED ORDER — DILTIAZEM HCL 60 MG PO TABS
60.0000 mg | ORAL_TABLET | Freq: Three times a day (TID) | ORAL | Status: DC
  Administered 2013-08-10 – 2013-08-13 (×9): 60 mg via ORAL
  Filled 2013-08-10 (×12): qty 1

## 2013-08-10 MED ORDER — GABAPENTIN 400 MG PO CAPS
800.0000 mg | ORAL_CAPSULE | Freq: Four times a day (QID) | ORAL | Status: DC
  Administered 2013-08-10 – 2013-08-18 (×33): 800 mg via ORAL
  Filled 2013-08-10 (×36): qty 2

## 2013-08-10 MED ORDER — MORPHINE SULFATE ER 30 MG PO TBCR
30.0000 mg | EXTENDED_RELEASE_TABLET | Freq: Two times a day (BID) | ORAL | Status: DC
  Administered 2013-08-10 – 2013-08-14 (×9): 30 mg via ORAL
  Filled 2013-08-10 (×9): qty 1

## 2013-08-10 MED ORDER — VANCOMYCIN HCL IN DEXTROSE 750-5 MG/150ML-% IV SOLN
750.0000 mg | Freq: Two times a day (BID) | INTRAVENOUS | Status: DC
  Administered 2013-08-10 – 2013-08-11 (×4): 750 mg via INTRAVENOUS
  Filled 2013-08-10 (×4): qty 150

## 2013-08-10 MED ORDER — METOPROLOL TARTRATE 50 MG PO TABS
75.0000 mg | ORAL_TABLET | Freq: Two times a day (BID) | ORAL | Status: DC
  Administered 2013-08-10 – 2013-08-15 (×11): 75 mg via ORAL
  Filled 2013-08-10 (×12): qty 1

## 2013-08-10 MED ORDER — MORPHINE SULFATE 2 MG/ML IJ SOLN
1.0000 mg | INTRAMUSCULAR | Status: DC | PRN
  Administered 2013-08-11 – 2013-08-14 (×6): 1 mg via INTRAVENOUS
  Filled 2013-08-10 (×6): qty 1

## 2013-08-10 MED ORDER — ACIDOPHILUS/CITRUS PECTIN PO TABS: 1.0000 | ORAL_TABLET | Freq: Every day | ORAL | Status: DC

## 2013-08-10 MED ORDER — COLLAGENASE 250 UNIT/GM EX OINT
1.0000 "application " | TOPICAL_OINTMENT | CUTANEOUS | Status: DC | PRN
  Filled 2013-08-10: qty 30

## 2013-08-10 MED ORDER — ACETAMINOPHEN 650 MG RE SUPP: 650.0000 mg | Freq: Four times a day (QID) | RECTAL | Status: DC | PRN

## 2013-08-10 MED ORDER — RISAQUAD PO CAPS
1.0000 | ORAL_CAPSULE | Freq: Every day | ORAL | Status: DC
  Administered 2013-08-10 – 2013-08-18 (×9): 1 via ORAL
  Filled 2013-08-10 (×9): qty 1

## 2013-08-10 MED ORDER — PIPERACILLIN-TAZOBACTAM 3.375 G IVPB
3.3750 g | Freq: Once | INTRAVENOUS | Status: AC
  Administered 2013-08-10: 3.375 g via INTRAVENOUS
  Filled 2013-08-10: qty 50

## 2013-08-10 MED ORDER — ONDANSETRON HCL 4 MG/2ML IJ SOLN
4.0000 mg | Freq: Four times a day (QID) | INTRAMUSCULAR | Status: DC | PRN
  Administered 2013-08-10: 4 mg via INTRAVENOUS
  Filled 2013-08-10: qty 2

## 2013-08-10 MED ORDER — ACETAMINOPHEN 325 MG PO TABS
650.0000 mg | ORAL_TABLET | Freq: Four times a day (QID) | ORAL | Status: DC | PRN
  Administered 2013-08-12 – 2013-08-14 (×6): 650 mg via ORAL
  Filled 2013-08-10 (×6): qty 2

## 2013-08-10 MED ORDER — CAPSICUM OLEORESIN 0.025 % EX CREA
1.0000 "application " | TOPICAL_CREAM | Freq: Four times a day (QID) | CUTANEOUS | Status: DC
  Administered 2013-08-13 – 2013-08-18 (×11): 1 via TOPICAL
  Filled 2013-08-10: qty 60

## 2013-08-10 MED ORDER — SODIUM CHLORIDE 0.9 % IV SOLN
INTRAVENOUS | Status: AC
  Administered 2013-08-10: 06:00:00 via INTRAVENOUS

## 2013-08-10 MED ORDER — PIPERACILLIN-TAZOBACTAM 3.375 G IVPB
3.3750 g | Freq: Three times a day (TID) | INTRAVENOUS | Status: DC
  Administered 2013-08-10 – 2013-08-12 (×7): 3.375 g via INTRAVENOUS
  Filled 2013-08-10 (×8): qty 50

## 2013-08-10 MED ORDER — ONDANSETRON HCL 4 MG PO TABS: 4.0000 mg | ORAL_TABLET | Freq: Four times a day (QID) | ORAL | Status: DC | PRN

## 2013-08-10 MED ORDER — MUPIROCIN 2 % EX OINT
1.0000 "application " | TOPICAL_OINTMENT | CUTANEOUS | Status: DC | PRN
  Filled 2013-08-10: qty 22

## 2013-08-10 MED ORDER — ATORVASTATIN CALCIUM 80 MG PO TABS
80.0000 mg | ORAL_TABLET | Freq: Every day | ORAL | Status: DC
  Administered 2013-08-10 – 2013-08-18 (×9): 80 mg via ORAL
  Filled 2013-08-10 (×9): qty 1

## 2013-08-10 MED ORDER — DILTIAZEM HCL 25 MG/5ML IV SOLN: 15.0000 mg | Freq: Once | INTRAVENOUS | Status: DC

## 2013-08-10 MED ORDER — SODIUM CHLORIDE 0.9 % IJ SOLN
3.0000 mL | Freq: Two times a day (BID) | INTRAMUSCULAR | Status: DC
  Administered 2013-08-11 – 2013-08-17 (×11): 3 mL via INTRAVENOUS

## 2013-08-10 MED ORDER — DIMETHICONE 1 % EX CREA
1.0000 "application " | TOPICAL_CREAM | Freq: Two times a day (BID) | CUTANEOUS | Status: DC
  Administered 2013-08-10 – 2013-08-17 (×16): 1 via TOPICAL
  Filled 2013-08-10 (×2): qty 120

## 2013-08-10 NOTE — H&P (Signed)
[facility] History and Physical  [name] [name] PJS:[id number] DOB: [birth year] DOA: 08/09/2013  Referring physician: ER physician. PCP: Mathews Argyle, MD   Chief Complaint: Weakness.  HPI: [name] [name] is a 79 y.o. female was brought to the ER after patient was complaining of weakness. In addition patient noticed increased redness of the left lower extremity. In the ER patient was found to be in atrial fibrillation and RVR and was started on Cardizem infusion. Patient in addition is also found to be febrile with leukocytosis. On exam patient has erythema of the left lower extremity extending up to the thigh. Patient states that her weakness and the left lower extremity redness started yesterday and has quickly progress to the left thigh. Denies any chest pain shortness of breath nausea vomiting abdominal pain diarrhea. Patient denies any pain in the left lower extremity. Patient also has erythema in the right lower extremity which patient states has been chronic. At this time blood cultures have been obtained and patient has been started on empiric antibiotics for sepsis from cellulitis of the left lower extremity. Patient was admitted for sepsis in April of this year from cholecystitis. Patient was just discharged recently 3 weeks ago after being treated for left lower extremity cellulitis and patient states at that time after discharge she did well.  Review of Systems: As presented in the history of presenting illness, rest negative.  Past Medical History  Diagnosis Date  . HTN (hypertension)   . DJD (degenerative joint disease)   . Osteopenia   . Dyslipidemia   . Venous ulcer   . Thrombocytopenia   . CAD (coronary artery disease)     a. NSTEMI 2/14 => LHC 10/20/12:  dLM 20%, pLAD 99%, mLAD 60% followed by 99%, oD1 99%, and pD2 30%, mild plaque disease in the CFX and RCA. PCI: Xience Xpedition DES to the proximal and mid LAD  . Hx of echocardiogram     a. Echo 10/18/12:  mod LVH, EF 60-65%, Gr 1 diast dysfn, mild AI, mild LAE.   Past Surgical History  Procedure Laterality Date  . Back surgery      Lumbar disc  . Hip surgery      R THR  . Eye surgery      Blepharoplasty  . Insertion / placement / revision neurostimulator    . Joint replacement     Social History:  reports that she has never smoked. She has never used smokeless tobacco. She reports that she does not drink alcohol or use illicit drugs. Where does patient live nursing home. Can patient participate in ADLs? Not sure.  No Known Allergies  Family History:  Family History  Problem Relation Age of Onset  . CAD Father 6    Vague history       Prior to Admission medications   Medication Sig Start Date End Date Taking? Authorizing Provider  acetaminophen (TYLENOL) 325 MG tablet Take 650 mg by mouth every 4 (four) hours as needed for pain. To stop 07/21/13   Yes Historical Provider, MD  amLODipine (NORVASC) 10 MG tablet Take 10 mg by mouth daily.   Yes Historical Provider, MD  aspirin 81 MG chewable tablet Chew 81 mg by mouth daily.   Yes Historical Provider, MD  atorvastatin (LIPITOR) 80 MG tablet Take 80 mg by mouth daily. RX is written for 6pm. Nursing Home administers at [place] Provider, MD  Calcium Carbonate-Vitamin D (CALCARB 600/D PO) Take 2 tablets by mouth  daily.   Yes Historical Provider, MD  capsicum oleoresin (TRIXAICIN) 0.025 % cream Apply 1 application topically 4 (four) times daily. To hip   Yes Historical Provider, MD  collagenase (SANTYL) ointment Apply 1 application topically as needed (dressing changes).   Yes Historical Provider, MD  dimethicone (REMEDY NUTRASHIELD) 1 % cream Apply 1 application topically 2 (two) times daily. To heels and legs   Yes Historical Provider, MD  fluticasone (FLONASE) 50 MCG/ACT nasal spray Place 2 sprays into the nose daily.   Yes Historical Provider, MD  furosemide (LASIX) 80 MG tablet Take 80 mg by mouth 2 (two) times daily.     Yes Historical Provider, MD  gabapentin (NEURONTIN) 800 MG tablet Take 800 mg by mouth 4 (four) times daily.   Yes Historical Provider, MD  Lactobacillus Acid-Pectin (ACIDOPHILUS/CITRUS PECTIN) TABS Take 1 tablet by mouth daily.   Yes Historical Provider, MD  metoprolol tartrate (LOPRESSOR) 25 MG tablet Take 75 mg by mouth 2 (two) times daily.   Yes Historical Provider, MD  mineral oil-hydrophilic petrolatum (AQUAPHOR) ointment Apply 1 application topically as needed for dry skin. knees   Yes Historical Provider, MD  mupirocin ointment (BACTROBAN) 2 % Apply 1 application topically as needed (with dressing changes).   Yes Historical Provider, MD  ondansetron (ZOFRAN) 4 MG tablet Take 4 mg by mouth every 8 (eight) hours as needed for nausea.   Yes Historical Provider, MD  Oxycodone HCl 20 MG TABS Take 1 tablet (20 mg total) by mouth every 6 (six) hours as needed (pain). 07/20/13  Yes Bobby Rumpf [place], PA-C  Polyethyl Glycol-Propyl Glycol (SYSTANE OP) Place 1 drop into both eyes 2 (two) times daily as needed (dry eyes).   Yes Historical Provider, MD  potassium chloride SA (K-DUR,KLOR-CON) 20 MEQ tablet Take 20 mEq by mouth 2 (two) times daily.   Yes Historical Provider, MD  Ticagrelor (BRILINTA) 90 MG TABS tablet Take 1 tablet (90 mg total) by mouth 2 (two) times daily. 10/21/12  Yes Sorin June Leap, MD  morphine (MS CONTIN) 30 MG 12 hr tablet Take 30 mg by mouth every 12 (twelve) hours.    Historical Provider, MD  saccharomyces boulardii (FLORASTOR) 250 MG capsule Take 250 mg by mouth 3 (three) times daily.     Historical Provider, MD  sulfamethoxazole-trimethoprim (BACTRIM DS) 800-160 MG per tablet Take 1 tablet by mouth 2 (two) times daily. 07/20/13   Melton Alar, PA-C    Physical Exam: Filed Vitals:   08/10/13 0200 08/10/13 0215 08/10/13 0230 08/10/13 0245  BP: 126/63 115/60 117/57   Pulse: 132 139 130 140  Temp:      TempSrc:      Resp: _0 SpO2: 93% 94% 93% 93%     General:   Well-developed well-nourished.  Eyes: Anicteric no pallor.  ENT: No discharge from ears eyes nose mouth.  Neck: No mass felt.  Cardiovascular: S1-S2 heard irregular and tachycardic.  Respiratory: No rhonchi or crepitations.  Abdomen: Soft nontender bowel sounds present.  Skin: Erythema of both lower extremities. Erythema in the left lower extremity extends up to the thigh. Erythema in the right lower extremity extends up to the knee. There is a small ulcer in the medial aspect of the left ankle.  Musculoskeletal: As explained in the skin.  Psychiatric: Appears normal.  Neurologic: Alert awake oriented to time place and person. Moves all extremities.  Labs on Admission:  Basic Metabolic Panel:  Recent Labs Lab 08/10/13 0015  NA 140  K 3.1*  CL 101  CO2 28  GLUCOSE 146*  BUN 28*  CREATININE 1.01  CALCIUM 9.5   Liver Function Tests:  Recent Labs Lab 08/10/13 0015  AST 14  ALT 8  ALKPHOS 126*  BILITOT 1.1  PROT 7.3  ALBUMIN 3.2*   No results found for this basename: LIPASE, AMYLASE,  in the last 168 hours  Recent Labs Lab 08/10/13 0015  AMMONIA 26   CBC:  Recent Labs Lab 08/10/13 0015  WBC 23.4*  NEUTROABS 21.7*  HGB 12.8  HCT 38.9  MCV 89.0  PLT 22*   Cardiac Enzymes: No results found for this basename: CKTOTAL, CKMB, CKMBINDEX, TROPONINI,  in the last 168 hours  BNP (last 3 results) No results found for this basename: PROBNP,  in the last 8760 hours CBG: No results found for this basename: GLUCAP,  in the last 168 hours  Radiological Exams on Admission: Dg Chest 2 View  08/09/2013   CLINICAL DATA:  Weakness and shortness of breath.  EXAM: CHEST  2 VIEW  COMPARISON:  12/20/2012  FINDINGS: Shallow inspiration. Borderline heart size and pulmonary vascularity may represent early congestive change. No evidence of edema or infiltration. Improving residual bilateral basilar atelectasis. No pneumothorax. Marked degenerative changes in the  shoulders. Stable postoperative changes in the spine.  IMPRESSION: Improving bilateral basilar atelectasis. Cardiac enlargement with borderline pulmonary vascularity may represent early congestive change. No infiltration or edema.   Electronically Signed   By: Lucienne Capers M.D.   On: 08/09/2013 23:33    EKG: Independently reviewed. A fibrillation RVR.  Assessment/Plan Principal Problem:   Sepsis Active Problems:   Thrombocytopenia   Cellulitis of leg   Atrial fibrillation with RVR   1. Sepsis secondary to cellulitis of the left lower extremity - blood cultures have been sent and patient has been started on vancomycin and Zosyn. Patient is a small ulcer on the medial aspect of the left ankle which has no active drainage. Check x-rays of the left tibia and fibula and ankle to rule out any osteomyelitis. Check sedimentation rate. Closely observe clinically for any development of compartment syndrome. Presently patient has good sensation and clinically does not look like patient has compartment syndrome. 2. Atrial fibrillation with RVR - patient's admission last month stated that patient had irregular heart rate. Presently patient is on Cardizem infusion. A. fib probably triggered by sepsis. Check in addition function and 2-D echo. Presently not on any anticoagulants because of #1 reason if in case patient requires surgical procedures and secondly due to the severe thrombocytopenia. Check BNP for early congestive heart failure from A. fib. 3. Severe thrombocytopenia - patient does have known history of chronic idiopathic thrombocytopenia. Closely follow CBC with differentials. Check DIC panel. 4. CAD status post CABG - denies any chest pain. 5. Chronic pain - continue home medications. 6. Hyperlipidemia - his CK levels are high may have to hold statins. 7. Recently admitted for cellulitis and admitted in April for cholecystitis.    Code Status: Full code.  Family Communication: None.   Disposition Plan: Admit to inpatient.    Alease Fait N. [facility] Pager [phone].  If 7PM-7AM, please contact night-coverage [email or web address] Password [email or web address] 08/10/2013, 3:08 AM

## 2013-08-10 NOTE — Progress Notes (Signed)
Patient seen and examined. Admitted earlier today for a fib with RVR in the presence of LLE Cellulitis. Agree with broad spectrum antibiotics (vanc/zosyn). Await imaging studies of leg (may need CT given rapid progression). Continue IV cardizem and start PO cardizem in attempts to wean drip. Will continue to follow closely. Keep in SDU given use of cardizem drip.  Domingo Mend, MD [facility] Pager: [phone]

## 2013-08-10 NOTE — Consult Note (Signed)
WOC wound consult note Reason for Consult: Consult requested for left ankle wound.  Pt previously had a stasis ulcer to this site which is healed. No need for wound care at this time. Small area of scab, no open wound or drainage.  Left leg with generalized erythremia and edema, tracking upwards to left thigh.  Topical care will not be effective for the cellulitis.  Pt on antibiotics for systemic coverage. Please re-consult if further assistance is needed.  Thank-you,  Julien Girt MSN, [place], [place], [place], [place]

## 2013-08-10 NOTE — Progress Notes (Signed)
ANTIBIOTIC CONSULT NOTE - INITIAL  Pharmacy Consult for Vancomycin & Zosyn Indication: Sepsis due to Cellulitis  No Known Allergies  Patient Measurements: Height: _0  (162.6 cm) Weight: 204 lb 12.9 oz (92.9 kg) IBW/kg (Calculated) : 54.7  Vital Signs: Temp: 100.9 F (38.3 C) (11/24 0037) Temp src: Rectal (11/24 0037) BP: 130/63 mmHg (11/24 0415) Pulse Rate: 112 (11/24 0430) Intake/Output from previous day:   Intake/Output from this shift:    Labs:  Recent Labs  08/10/13 0015  WBC 23.4*  HGB 12.8  PLT 22*  CREATININE 1.01   Estimated Creatinine Clearance: 49.1 ml/min (by C-G formula based on Cr of 1.01). No results found for this basename: VANCOTROUGH, VANCOPEAK, VANCORANDOM, [place], GENTPEAK, [place], [place], TOBRAPEAK, TOBRARND, AMIKACINPEAK, AMIKACINTROU, AMIKACIN,  in the last 72 hours   Microbiology: Recent Results (from the past 720 hour(s))  CULTURE, BLOOD (ROUTINE X 2)     Status: None   Collection Time    07/16/13  1:50 PM      Result Value Range Status   Specimen Description BLOOD LEFT ANTECUBITAL   Final   Special Requests BOTTLES DRAWN AEROBIC AND ANAEROBIC 5CC   Final   Culture  Setup Time     Final   Value: 07/16/2013 21:58     Performed at [place]     Final   Value: NO GROWTH 5 DAYS     Performed at [facility]   Report Status 07/22/2013 FINAL   Final  CULTURE, BLOOD (ROUTINE X 2)     Status: None   Collection Time    07/16/13  3:30 PM      Result Value Range Status   Specimen Description BLOOD RIGHT FOREARM   Final   Special Requests BOTTLES DRAWN AEROBIC AND ANAEROBIC 5CC   Final   Culture  Setup Time     Final   Value: 07/16/2013 21:58     Performed at [facility]   Culture     Final   Value: NO GROWTH 5 DAYS     Performed at [facility]   Report Status 07/22/2013 FINAL   Final  MRSA PCR SCREENING     Status: None   Collection Time    07/19/13  2:16 AM      Result Value  Range Status   MRSA by PCR NEGATIVE  NEGATIVE Final   Comment:            The GeneXpert MRSA Assay (FDA     approved for NASAL specimens     only), is one component of a     comprehensive MRSA colonization     surveillance program. It is not     intended to diagnose MRSA     infection nor to guide or     monitor treatment for     MRSA infections.    Medical History: Past Medical History  Diagnosis Date  . HTN (hypertension)   . DJD (degenerative joint disease)   . Osteopenia   . Dyslipidemia   . Venous ulcer   . Thrombocytopenia   . CAD (coronary artery disease)     a. NSTEMI 2/14 => LHC 10/20/12:  dLM 20%, pLAD 99%, mLAD 60% followed by 99%, oD1 99%, and pD2 30%, mild plaque disease in the CFX and RCA. PCI: Xience Xpedition DES to the proximal and mid LAD  . Hx of echocardiogram     a. Echo 10/18/12: mod LVH, EF 60-65%,  Gr 1 diast dysfn, mild AI, mild LAE.    Medications:  Scheduled:  . acidophilus  1 capsule Oral Daily  . atorvastatin  80 mg Oral Daily  . capsicum oleoresin  1 application Topical QID  . dimethicone  1 application Topical BID  . fluticasone  2 spray Each Nare Daily  . gabapentin  800 mg Oral QID  . metoprolol tartrate  75 mg Oral BID  . morphine  30 mg Oral Q12H  . saccharomyces boulardii  250 mg Oral TID  . sodium chloride  3 mL Intravenous Q12H  . Ticagrelor  90 mg Oral BID   Infusions:  . sodium chloride    . diltiazem (CARDIZEM) infusion 15 mg/hr (08/10/13 0215)  . piperacillin-tazobactam (ZOSYN)  IV 3.375 g (08/10/13 0127)   Assessment:  79 yr female with complaint of weakness.  Also with redness of left lower extremity.  Pt also found to be in Afib with RVR in ED and started on diltiazem drip. Brilinta also initiated.  Temp = 100.38F, WBC 23.4, Scr 1.01 with CrCl (CG) = 49 ml/min  Blood culture ordered  Zosyn 3.375gm IV x 1 given in ED @ 01:27  Vancomycin and Zosyn per pharmacy dosing ordered for Sepsis due to cellulitis.  Plan to check  X-rays to r/o osteomyelitis  Goal of Therapy:  Vancomycin trough level 15-20 mcg/ml  Plan:  Measure antibiotic drug levels at steady state Follow up culture results Zosyn 3.375gm IV q8h (each dose infused over 4 hrs) Vancomycin 761m IV q12h   Cecille Mcclusky, LToribio Harbour PharmD 08/10/2013,4:46 AM

## 2013-08-10 NOTE — Progress Notes (Signed)
Echocardiogram 2D Echocardiogram has been performed.  [name] [name] 08/10/2013, 12:40 PM

## 2013-08-11 DIAGNOSIS — D72829 Elevated white blood cell count, unspecified: Secondary | ICD-10-CM | POA: Diagnosis present

## 2013-08-11 DIAGNOSIS — I25810 Atherosclerosis of coronary artery bypass graft(s) without angina pectoris: Secondary | ICD-10-CM

## 2013-08-11 LAB — BASIC METABOLIC PANEL
BUN: 19 mg/dL (ref 6–23)
Chloride: 100 mEq/L (ref 96–112)
Creatinine, Ser: 1.03 mg/dL (ref 0.50–1.10)
GFR calc Af Amer: 58 mL/min — ABNORMAL LOW (ref >90)
Glucose, Bld: 118 mg/dL — ABNORMAL HIGH (ref 70–99)

## 2013-08-11 LAB — CBC
HCT: 36.6 % (ref 36.0–46.0)
MCHC: 32.8 g/dL (ref 30.0–36.0)
Platelets: 34 10*3/uL — ABNORMAL LOW (ref 150–400)
RBC: 4.06 MIL/uL (ref 3.87–5.11)
RDW: 15.4 % (ref 11.5–15.5)

## 2013-08-11 LAB — GLUCOSE, CAPILLARY: Glucose-Capillary: 113 mg/dL — ABNORMAL HIGH (ref 70–99)

## 2013-08-11 MED ORDER — POTASSIUM CHLORIDE CRYS ER 20 MEQ PO TBCR
40.0000 meq | EXTENDED_RELEASE_TABLET | Freq: Once | ORAL | Status: AC
  Administered 2013-08-11: 40 meq via ORAL
  Filled 2013-08-11: qty 2

## 2013-08-11 MED ORDER — OXYCODONE HCL 5 MG PO TABS
5.0000 mg | ORAL_TABLET | Freq: Four times a day (QID) | ORAL | Status: DC | PRN
Start: 2013-08-11 — End: 2013-08-14
  Administered 2013-08-11 – 2013-08-14 (×10): 5 mg via ORAL
  Filled 2013-08-11 (×10): qty 1

## 2013-08-11 MED ORDER — METOPROLOL TARTRATE 1 MG/ML IV SOLN
5.0000 mg | Freq: Four times a day (QID) | INTRAVENOUS | Status: DC | PRN
  Administered 2013-08-13: 06:00:00 5 mg via INTRAVENOUS
  Filled 2013-08-11: qty 5

## 2013-08-11 MED ORDER — VANCOMYCIN HCL IN DEXTROSE 1-5 GM/200ML-% IV SOLN
1000.0000 mg | Freq: Two times a day (BID) | INTRAVENOUS | Status: DC
  Administered 2013-08-12: 1000 mg via INTRAVENOUS
  Filled 2013-08-11 (×2): qty 200

## 2013-08-11 NOTE — Progress Notes (Signed)
ANTIBIOTIC CONSULT NOTE - FOLLOW UP  Pharmacy Consult for Vancomycin & Zosyn Indication: Sepsis due to Cellulitis   No Known Allergies  Patient Measurements: Height: _0  (162.6 cm) Weight: 208 lb 12.4 oz (94.7 kg) IBW/kg (Calculated) : 54.7 Adjusted Body Weight:   Vital Signs: Temp: 98.4 F (36.9 C) (11/25 1758) Temp src: Oral (11/25 1758) BP: 129/61 mmHg (11/25 1758) Pulse Rate: 85 (11/25 1758) Intake/Output from previous day: 11/24 0701 - 11/25 0700 In: 3490 [P.O.:360; I.V.:2730; IV Piggyback:400] Out: -  Intake/Output from this shift: Total I/O In: 250 [IV Piggyback:250] Out: -   Labs:  Recent Labs  08/10/13 0015 08/10/13 0630 08/11/13 0350  WBC 23.4* 20.3* 15.4*  HGB 12.8 12.5 12.0  PLT 22* 22*  17* 34*  CREATININE 1.01 0.98 1.03   Estimated Creatinine Clearance: 48.6 ml/min (by C-G formula based on Cr of 1.03).  Recent Labs  08/11/13 1653  [place] 13.4     Microbiology: Recent Results (from the past 720 hour(s))  CULTURE, BLOOD (ROUTINE X 2)     Status: None   Collection Time    07/16/13  1:50 PM      Result Value Range Status   Specimen Description BLOOD LEFT ANTECUBITAL   Final   Special Requests BOTTLES DRAWN AEROBIC AND ANAEROBIC 5CC   Final   Culture  Setup Time     Final   Value: 07/16/2013 21:58     Performed at [facility]   Culture     Final   Value: NO GROWTH 5 DAYS     Performed at [facility]   Report Status 07/22/2013 FINAL   Final  CULTURE, BLOOD (ROUTINE X 2)     Status: None   Collection Time    07/16/13  3:30 PM      Result Value Range Status   Specimen Description BLOOD RIGHT FOREARM   Final   Special Requests BOTTLES DRAWN AEROBIC AND ANAEROBIC 5CC   Final   Culture  Setup Time     Final   Value: 07/16/2013 21:58     Performed at [facility]   Culture     Final   Value: NO GROWTH 5 DAYS     Performed at [facility]   Report Status 07/22/2013 FINAL   Final  MRSA PCR SCREENING      Status: None   Collection Time    07/19/13  2:16 AM      Result Value Range Status   MRSA by PCR NEGATIVE  NEGATIVE Final   Comment:            The GeneXpert MRSA Assay (FDA     approved for NASAL specimens     only), is one component of a     comprehensive MRSA colonization     surveillance program. It is not     intended to diagnose MRSA     infection nor to guide or     monitor treatment for     MRSA infections.  CULTURE, BLOOD (SINGLE)     Status: None   Collection Time    08/10/13 12:15 AM      Result Value Range Status   Specimen Description BLOOD LEFT HAND   Final   Special Requests BOTTLES DRAWN AEROBIC AND ANAEROBIC 5CC   Final   Culture  Setup Time     Final   Value: 08/10/2013 10:41     Performed at [facility]  Culture     Final   Value:        BLOOD CULTURE RECEIVED NO GROWTH TO DATE CULTURE WILL BE HELD FOR 5 DAYS BEFORE ISSUING A FINAL NEGATIVE REPORT     Performed at [facility]   Report Status PENDING   Incomplete  MRSA PCR SCREENING     Status: None   Collection Time    08/10/13  5:58 AM      Result Value Range Status   MRSA by PCR NEGATIVE  NEGATIVE Final   Comment:            The GeneXpert MRSA Assay (FDA     approved for NASAL specimens     only), is one component of a     comprehensive MRSA colonization     surveillance program. It is not     intended to diagnose MRSA     infection nor to guide or     monitor treatment for     MRSA infections.    Anti-infectives   Start     Dose/Rate Route Frequency Ordered Stop   08/10/13 0900  piperacillin-tazobactam (ZOSYN) IVPB 3.375 g     3.375 g 12.5 mL/hr over 240 Minutes Intravenous Every 8 hours 08/10/13 0452     08/10/13 0500  vancomycin (VANCOCIN) IVPB 750 mg/150 ml premix     750 mg 150 mL/hr over 60 Minutes Intravenous Every 12 hours 08/10/13 0452     08/10/13 0115  piperacillin-tazobactam (ZOSYN) IVPB 3.375 g     3.375 g 12.5 mL/hr over 240 Minutes Intravenous  Once  08/10/13 0100 08/10/13 0527      Assessment: 79 yr female with complaint of weakness, pain, and redness of left lower extremity. Xrays were negative for osteomyelitis.  11/24 >>Vanc >> 11/24 >>Zosyn >>   Tmax: AF WBCs: elev but improving Renal: SCr wnl, 49CG, 50N  11/24 MRSA PCR: neg 11/24 blood x 1: ngtd  Goal of Therapy:  Vancomycin trough level 15-20 mcg/ml ( Using higher goal d/t severity and sepsis).   Vanc trough= 13.4 Plan:  Will increase to Vancomycin 1000 mg IV q 12 hours based on trough level of 13.4 and the need to attain goal of 15-20 Measure antibiotic drug levels at steady state Follow up culture results  Gypsy Decant 08/11/2013,6:14 PM

## 2013-08-11 NOTE — Progress Notes (Signed)
[facility] PROGRESS NOTE  [name] [name] WYS:[id number] DOB: 02/12/1931 DOA: 08/09/2013 PCP: Mathews Argyle, MD  Assessment/Plan: A Fib with RVR -Rates now improved off diltiazem drip. -Continue PO cardizem and IV lopressor as needed.  Right Leg Cellulitis -Appears clinically improved. -Leukocytosis improving. -xrays negative for osteomyelitis or gas. -Continue current antibiotic regimen for now.  Severe Thrombocytopenia -Patient has a known h/o chronic idiopathic thrombocytopenia. -Plt are improved today at 34.  CAD -s/p CABD. -Denies CP.  Code Status: DNR Family Communication: Patient only  Disposition Plan: Transfer to telemetry   Consultants:  None   Antibiotics:  Vanc 08/10/13-->  Zosyn 08/10/13-->     Subjective: "I hurt all over"  Objective: Filed Vitals:   08/11/13 1000 08/11/13 1018 08/11/13 1100 08/11/13 1200  BP: 120/62 132/57 133/84 87/61  Pulse: 95 108 95 105  Temp:    98.4 F (36.9 C)  TempSrc:    Oral  Resp: _0 Height:      Weight:      SpO2: 91%  92% 94%    Intake/Output Summary (Last 24 hours) at 08/11/13 1558 Last data filed at 08/11/13 1115  Gross per 24 hour  Intake   2210 ml  Output      0 ml  Net   2210 ml   Filed Weights   08/10/13 0442 08/10/13 0500 08/11/13 0400  Weight: 92.9 kg (204 lb 12.9 oz) 93.7 kg (206 lb 9.1 oz) 94.7 kg (208 lb 12.4 oz)    Exam:   General:  AA Ox3  Cardiovascular: irreg, tachu  Respiratory: CTA B  Abdomen: S/NT/ND/+BS  Extremities: Left with 1+edema redness estending up into thigh but has improved since yesterday. Chronic venous insufficiency changes over bilateral LE.  Neurologic:  Non-focal.  Data Reviewed: Basic Metabolic Panel:  Recent Labs Lab 08/10/13 0015 08/10/13 0630 08/11/13 0350  NA 140 140 138  K 3.1* 3.1* 3.3*  CL 101 102 100  CO2 _1 GLUCOSE 146* 143* 118*  BUN 28* 24* 19  CREATININE 1.01 0.98 1.03  CALCIUM 9.5 9.0 8.9    Liver Function Tests:  Recent Labs Lab 08/10/13 0015 08/10/13 0630  AST 14 14  ALT 8 8  ALKPHOS 126* 120*  BILITOT 1.1 1.0  PROT 7.3 7.0  ALBUMIN 3.2* 2.8*   No results found for this basename: LIPASE, AMYLASE,  in the last 168 hours  Recent Labs Lab 08/10/13 0015  AMMONIA 26   CBC:  Recent Labs Lab 08/10/13 0015 08/10/13 0630 08/11/13 0350  WBC 23.4* 20.3* 15.4*  NEUTROABS 21.7* 18.7*  --   HGB 12.8 12.5 12.0  HCT 38.9 38.1 36.6  MCV 89.0 89.4 90.1  PLT 22* 22*  17* 34*   Cardiac Enzymes:  Recent Labs Lab 08/10/13 0630  CKTOTAL 117  TROPONINI <0.30   BNP (last 3 results)  Recent Labs  08/10/13 0630  PROBNP 2308.0*   CBG:  Recent Labs Lab 08/10/13 1149 08/11/13 1156  GLUCAP 183* 113*    Recent Results (from the past 240 hour(s))  CULTURE, BLOOD (SINGLE)     Status: None   Collection Time    08/10/13 12:15 AM      Result Value Range Status   Specimen Description BLOOD LEFT HAND   Final   Special Requests BOTTLES DRAWN AEROBIC AND ANAEROBIC 5CC   Final   Culture  Setup Time     Final   Value: 08/10/2013 10:41     Performed  at [facility]     Final   Value:        BLOOD CULTURE RECEIVED NO GROWTH TO DATE CULTURE WILL BE HELD FOR 5 DAYS BEFORE ISSUING A FINAL NEGATIVE REPORT     Performed at [facility]   Report Status PENDING   Incomplete  MRSA PCR SCREENING     Status: None   Collection Time    08/10/13  5:58 AM      Result Value Range Status   MRSA by PCR NEGATIVE  NEGATIVE Final   Comment:            The GeneXpert MRSA Assay (FDA     approved for NASAL specimens     only), is one component of a     comprehensive MRSA colonization     surveillance program. It is not     intended to diagnose MRSA     infection nor to guide or     monitor treatment for     MRSA infections.     Studies: Dg Chest 2 View  08/09/2013   CLINICAL DATA:  Weakness and shortness of breath.  EXAM: CHEST  2 VIEW   COMPARISON:  12/20/2012  FINDINGS: Shallow inspiration. Borderline heart size and pulmonary vascularity may represent early congestive change. No evidence of edema or infiltration. Improving residual bilateral basilar atelectasis. No pneumothorax. Marked degenerative changes in the shoulders. Stable postoperative changes in the spine.  IMPRESSION: Improving bilateral basilar atelectasis. Cardiac enlargement with borderline pulmonary vascularity may represent early congestive change. No infiltration or edema.   Electronically Signed   By: Lucienne Capers M.D.   On: 08/09/2013 23:33   Dg Femur Left  08/10/2013   CLINICAL DATA:  Left leg red and swollen.  EXAM: LEFT FEMUR - 2 VIEW  COMPARISON:  None.  FINDINGS: No fracture or dislocation.  There are advanced arthropathic changes of the hip with marked superior lateral joint space narrowing, subchondral sclerosis and subchondral cystic change. There also advanced degenerative changes of the knee. The bones are diffusely demineralized. There is a small knee joint effusion. The soft tissues are otherwise unremarkable.  IMPRESSION: No fracture or dislocation.  Advanced hip and knee joint arthropathic changes and diffuse bony demineralization.   Electronically Signed   By: Lajean Manes M.D.   On: 08/10/2013 17:18   Dg Tibia/fibula Left  08/10/2013   CLINICAL DATA:  Left lower extremity swelling.  EXAM: LEFT TIBIA AND FIBULA - 2 VIEW  COMPARISON:  None.  FINDINGS: No fracture or dislocation is noted. No lytic destruction is seen to suggest osteomyelitis. No soft tissue abnormality is noted. Degenerative joint disease of the left knee is noted. Diffuse osteopenia is noted.  IMPRESSION: No fracture or dislocation is noted.  No evidence of osteomyelitis.   Electronically Signed   By: Sabino Dick M.D.   On: 08/10/2013 17:20    Scheduled Meds: . acidophilus  1 capsule Oral Daily  . atorvastatin  80 mg Oral Daily  . capsicum oleoresin  1 application Topical QID   . diltiazem  60 mg Oral Q8H  . dimethicone  1 application Topical BID  . fluticasone  2 spray Each Nare Daily  . gabapentin  800 mg Oral QID  . metoprolol tartrate  75 mg Oral BID  . morphine  30 mg Oral Q12H  . piperacillin-tazobactam (ZOSYN)  IV  3.375 g Intravenous Q8H  . saccharomyces boulardii  250 mg Oral TID  .  sodium chloride  3 mL Intravenous Q12H  . Ticagrelor  90 mg Oral BID  . vancomycin  750 mg Intravenous Q12H   Continuous Infusions:   Principal Problem:   Sepsis Active Problems:   Thrombocytopenia   Hypokalemia   Cellulitis of leg   Atrial fibrillation with RVR   Leukocytosis, unspecified    Time spent: 35 minutes    [facility] Pager [phone]  If 7PM-7AM, please contact night-coverage at [email or web address], password [facility] 08/11/2013, 3:58 PM  LOS: 2 days

## 2013-08-11 NOTE — Progress Notes (Signed)
Clinical Social Work Department BRIEF PSYCHOSOCIAL ASSESSMENT 08/11/2013  Patient:  [name] [name], [name] [name]     Account Number:  [id number]     Admit date:  08/09/2013  Clinical Social Worker:  Renold Genta  Date/Time:  08/11/2013 04:45 PM  Referred by:  Physician  Date Referred:  08/11/2013 Referred for  Other - See comment   Other Referral:   Admitted from: [facility]   Interview type:  Family Other interview type:   patient's son, Zenia Resides via phone    PSYCHOSOCIAL DATA Living Status:  FACILITY Admitted from facility:  [facility] Level of care:  [place] Primary support name:  Carren Blakley (son) ph#: [phone] Primary support relationship to patient:  CHILD, ADULT Degree of support available:   good    CURRENT CONCERNS Current Concerns  Post-Acute Placement   Other Concerns:    SOCIAL WORK ASSESSMENT / PLAN CSW received referral that patient was admitted from [facility], [place] confirmed with [name] @ [facility] that patient was from their SNF.   Assessment/plan status:  Information/Referral to [employer] Other assessment/ plan:   Information/referral to community resources:   CSW completed FL2 and faxed information to [facility], confirmed with [name] that they would be able to take patient back when stable.    PATIENT'S/FAMILY'S RESPONSE TO PLAN OF CARE: Patient's son states that he has been pleased with the care his mother has been receiving at [facility] and plans to have her return there when stable.       Winfred Leeds, [facility] Clinical Social Worker cell #: [phone]

## 2013-08-11 NOTE — Progress Notes (Signed)
ANTIBIOTIC CONSULT NOTE - Follow up  Pharmacy Consult for Vancomycin & Zosyn Indication: Sepsis due to Cellulitis  No Known Allergies  Patient Measurements: Height: 5' 4" (162.6 cm) Weight: 208 lb 12.4 oz (94.7 kg) IBW/kg (Calculated) : 54.7  Vital Signs: Temp: 98.8 F (37.1 C) (11/25 0400) Temp src: Oral (11/25 0400) BP: 107/64 mmHg (11/25 0600) Pulse Rate: 105 (11/25 0600) Intake/Output from previous day: 11/24 0701 - 11/25 0700 In: 3490 [P.O.:360; I.V.:2730; IV Piggyback:400] Out: -  Intake/Output from this shift:    Labs:  Recent Labs  08/10/13 0015 08/10/13 0630 08/11/13 0350  WBC 23.4* 20.3* 15.4*  HGB 12.8 12.5 12.0  PLT 22* 22*  17* 34*  CREATININE 1.01 0.98 1.03   Estimated Creatinine Clearance: 48.6 ml/min (by C-G formula based on Cr of 1.03). No results found for this basename: VANCOTROUGH, VANCOPEAK, VANCORANDOM, [place], GENTPEAK, [place], [place], TOBRAPEAK, TOBRARND, AMIKACINPEAK, AMIKACINTROU, AMIKACIN,  in the last 72 hours   Microbiology: Recent Results (from the past 720 hour(s))  CULTURE, BLOOD (ROUTINE X 2)     Status: None   Collection Time    07/16/13  1:50 PM      Result Value Range Status   Specimen Description BLOOD LEFT ANTECUBITAL   Final   Special Requests BOTTLES DRAWN AEROBIC AND ANAEROBIC 5CC   Final   Culture  Setup Time     Final   Value: 07/16/2013 21:58     Performed at [facility]     Final   Value: NO GROWTH 5 DAYS     Performed at [facility]   Report Status 07/22/2013 FINAL   Final  CULTURE, BLOOD (ROUTINE X 2)     Status: None   Collection Time    07/16/13  3:30 PM      Result Value Range Status   Specimen Description BLOOD RIGHT FOREARM   Final   Special Requests BOTTLES DRAWN AEROBIC AND ANAEROBIC 5CC   Final   Culture  Setup Time     Final   Value: 07/16/2013 21:58     Performed at [facility]   Culture     Final   Value: NO GROWTH 5 DAYS     Performed at  [facility]   Report Status 07/22/2013 FINAL   Final  MRSA PCR SCREENING     Status: None   Collection Time    07/19/13  2:16 AM      Result Value Range Status   MRSA by PCR NEGATIVE  NEGATIVE Final   Comment:            The GeneXpert MRSA Assay (FDA     approved for NASAL specimens     only), is one component of a     comprehensive MRSA colonization     surveillance program. It is not     intended to diagnose MRSA     infection nor to guide or     monitor treatment for     MRSA infections.  CULTURE, BLOOD (SINGLE)     Status: None   Collection Time    08/10/13 12:15 AM      Result Value Range Status   Specimen Description BLOOD LEFT HAND   Final   Special Requests BOTTLES DRAWN AEROBIC AND ANAEROBIC 5CC   Final   Culture  Setup Time     Final   Value: 08/10/2013 10:41     Performed at [facility]  Culture     Final   Value:        BLOOD CULTURE RECEIVED NO GROWTH TO DATE CULTURE WILL BE HELD FOR 5 DAYS BEFORE ISSUING A FINAL NEGATIVE REPORT     Performed at [facility]   Report Status PENDING   Incomplete  MRSA PCR SCREENING     Status: None   Collection Time    08/10/13  5:58 AM      Result Value Range Status   MRSA by PCR NEGATIVE  NEGATIVE Final   Comment:            The GeneXpert MRSA Assay (FDA     approved for NASAL specimens     only), is one component of a     comprehensive MRSA colonization     surveillance program. It is not     intended to diagnose MRSA     infection nor to guide or     monitor treatment for     MRSA infections.    Assessment: 79 yr female with complaint of weakness, pain, and redness of left lower extremity. Xrays were negative for osteomyelitis.  11/24 >>Vanc >> 11/24 >>Zosyn >>   Tmax: AF WBCs: elev but improving Renal: SCr wnl, 49CG, 50N  11/24 MRSA PCR: neg 11/24 blood x 1: ngtd  Goal of Therapy:  Vancomycin trough level 15-20 mcg/ml (Using higher goal d/t severity and sepsis).  Plan:  Cont  Vanc 7105m IV q12h. Cont Zosyn 3.375g IV Q8H infused over 4hrs. Measure Vanc trough before 4th dose today. Follow up renal fxn and culture results.  TRomeo Rabon PharmD, pager [phone] 08/11/2013,8:46 AM.

## 2013-08-12 DIAGNOSIS — I10 Essential (primary) hypertension: Secondary | ICD-10-CM

## 2013-08-12 DIAGNOSIS — D72829 Elevated white blood cell count, unspecified: Secondary | ICD-10-CM

## 2013-08-12 DIAGNOSIS — E876 Hypokalemia: Secondary | ICD-10-CM

## 2013-08-12 LAB — BASIC METABOLIC PANEL
BUN: 14 mg/dL (ref 6–23)
Calcium: 8.9 mg/dL (ref 8.4–10.5)
Chloride: 101 mEq/L (ref 96–112)
Creatinine, Ser: 0.93 mg/dL (ref 0.50–1.10)
GFR calc Af Amer: 66 mL/min — ABNORMAL LOW (ref >90)
Glucose, Bld: 100 mg/dL — ABNORMAL HIGH (ref 70–99)
Potassium: 3.1 mEq/L — ABNORMAL LOW (ref 3.5–5.1)

## 2013-08-12 LAB — CBC
HCT: 37 % (ref 36.0–46.0)
Hemoglobin: 12.1 g/dL (ref 12.0–15.0)
MCH: 29.4 pg (ref 26.0–34.0)
MCHC: 32.7 g/dL (ref 30.0–36.0)
MCV: 89.8 fL (ref 78.0–100.0)
RDW: 15.2 % (ref 11.5–15.5)

## 2013-08-12 MED ORDER — POTASSIUM CHLORIDE CRYS ER 20 MEQ PO TBCR
40.0000 meq | EXTENDED_RELEASE_TABLET | Freq: Once | ORAL | Status: AC
  Administered 2013-08-12: 12:00:00 40 meq via ORAL
  Filled 2013-08-12: qty 2

## 2013-08-12 MED ORDER — SULFAMETHOXAZOLE-TMP DS 800-160 MG PO TABS
1.0000 | ORAL_TABLET | Freq: Two times a day (BID) | ORAL | Status: DC
  Administered 2013-08-12 – 2013-08-16 (×10): 1 via ORAL
  Filled 2013-08-12 (×12): qty 1

## 2013-08-12 MED ORDER — VITAMINS A & D EX OINT
TOPICAL_OINTMENT | CUTANEOUS | Status: AC
  Administered 2013-08-12: 5
  Filled 2013-08-12: qty 5

## 2013-08-12 NOTE — Evaluation (Signed)
Physical Therapy Evaluation Patient Details Name: [name] [name] MRN: [id number] DOB: [birth year] Today's Date: 08/12/2013 Time: 1219-1230 PT Time Calculation (min): 11 min  PT Assessment / Plan / Recommendation History of Present Illness  79 y.o. female admitted to [facility] for afib with RVR and R LE cellulitis and has a history of chronic venous insufficiency, multiple back surgeries, and chronic pain   Clinical Impression  Pt currently with functional limitations due to the deficits listed below (see PT Problem List).  Pt will benefit from skilled PT to increase their independence and safety with mobility to allow discharge to the venue listed below.  Pt appears close to her baseline and states she would be agreeable to PT in acute care to continue mobilization however does not feel she needs upon d/c.  Pt plans to [facility].     PT Assessment  Patient needs continued PT services    Follow Up Recommendations  No PT follow up (pt plans to return to [facility])    Does the patient have the potential to tolerate intense rehabilitation      Barriers to Discharge        Equipment Recommendations  None recommended by PT    Recommendations for Other Services     Frequency Min 2X/week    Precautions / Restrictions Precautions Precautions: Fall   Pertinent Vitals/Pain Pt reports R LE a little tender      Mobility  Bed Mobility Bed Mobility: Supine to Sit;Sitting - Scoot to Edge of Bed Supine to Sit: With rails;HOB elevated;5: Supervision Sitting - Scoot to Edge of Bed: With rail;5: Supervision Transfers Transfers: Sit to Stand;Stand to [employer] Transfers Sit to Stand: With upper extremity assist;With armrests;From bed;4: Min guard;5: Supervision Stand to Sit: With armrests;To chair/3-in-1;4: Min guard;5: Supervision Stand Pivot Transfers: With armrests;4: Min guard;5: Supervision Details for Transfer Assistance: pt appears close to her baseline and states she  doesn't normally use RW or hold onto things for transfers and very short distance ambulation however pt was stabilizing herself with her UEs along recliner during transfer to recliner    Exercises     PT Diagnosis: Difficulty walking;Abnormality of gait;Generalized weakness  PT Problem List: Decreased strength;Decreased activity tolerance;Pain;Decreased mobility PT Treatment Interventions: DME instruction;Gait training;Functional mobility training;Patient/family education;Therapeutic activities;Therapeutic exercise     PT Goals(Current goals can be found in the care plan section) Acute Rehab PT Goals Patient Stated Goal: to go back to [facility] ASAP PT Goal Formulation: With patient Time For Goal Achievement: 08/19/13 Potential to Achieve Goals: Good  Visit Information  Last PT Received On: 08/12/13 Assistance Needed: +1 History of Present Illness: 79 y.o. female admitted to [facility] for afib with RVR and R LE cellulitis and has a history of chronic venous insufficiency, multiple back surgeries, and chronic pain        Prior [place] expects to be discharged to:: Skilled nursing facility [facility]) Additional Comments: Does not walk much- using w/c mainly, goes to dining hall for all meals does not cook Prior Function Level of Independence: Independent with assistive device(s) Comments: pt reports she is modified independent with motorized w/c Communication Communication: No difficulties Dominant Hand: Right    Cognition  Cognition Arousal/Alertness: Awake/alert Behavior During Therapy: WFL for tasks assessed/performed Overall Cognitive Status: Within Functional Limits for tasks assessed    Extremity/Trunk Assessment Lower Extremity Assessment Lower Extremity Assessment: Generalized weakness (R lower leg with more edema than L) Cervical / Trunk Assessment Cervical /  Trunk Assessment: Other exceptions Cervical / Trunk Exceptions: in standing  pt is flexed ~80 degrees at hips and trunk and is unable to straighten    Balance    End of Session PT - End of Session Activity Tolerance: Patient tolerated treatment well Patient left: in chair;with call bell/phone within reach  GP     Teresa Nicodemus,KATHrine E 08/12/2013, 1:04 PM Carmelia Bake, PT, DPT 08/12/2013 Pager: [phone]

## 2013-08-12 NOTE — Care Management Note (Addendum)
    Page 1 of 1   08/18/2013     1:19:20 PM   CARE MANAGEMENT NOTE 08/18/2013  Patient:  [name] [name], [name] [name]   Account Number:  [id number]  Date Initiated:  08/12/2013  Documentation initiated by:  Dessa Phi  Subjective/Objective Assessment:   79 Y/O F ADMITTED W/SEPSIS.     Action/Plan:   FROM [facility].   Anticipated DC Date:  08/18/2013   Anticipated DC Plan:  [place]  CM consult      Choice offered to / List presented to:             Status of service:  Completed, signed off Medicare Important Message given?   (If response is "NO", the following Medicare IM given date fields will be blank) Date Medicare IM given:   Date Additional Medicare IM given:    Discharge Disposition:  [place]  Per UR Regulation:  Reviewed for med. necessity/level of care/duration of stay  If discussed at [place] of Stay Meetings, dates discussed:   08/18/2013    Comments:  08/18/13 Griffith Santilli RN,BSN NCM [phone] D/C SNF.  08/17/13 Glendell Schlottman RN,BSN NCM [phone] AWAITING  THERAPEUTIC INR. CURRENTLY INR-1.24.D/C PLAN RETURN SNF.  08/14/13 Rumaysa Sabatino RN,BSN NCM [place] LEVEL-SNF.  08/12/13 Diontre Harps RN,BSN NCM [phone] PT-CAN RETURN TO SNF.

## 2013-08-12 NOTE — Progress Notes (Addendum)
[facility] PROGRESS NOTE  [name] [name] AWN:[id number] DOB: 1931/07/09 DOA: 08/09/2013 PCP: Mathews Argyle, MD  Assessment/Plan:  A Fib with RVR  -Rates now improved off diltiazem drip.  -Continue PO cardizem and IV lopressor as needed.  - With resolution of infection. Suspect heart rate will be easier controlled  Right Leg Cellulitis  -Appears clinically improved.  -Leukocytosis improving.  -xrays negative for osteomyelitis or gas.  -We'll change to oral antibiotics and assess condition next a.m. Patient has been afebrile and WBC within normal limits on last check  Severe Thrombocytopenia  -Patient has a known h/o chronic idiopathic thrombocytopenia.  -Plt are improved today at 34.   CAD  -s/p CABD.  -Denies CP.  Addendum: Hypokalemia - Will replace orally and reassess next a.m.  Code Status: Full code Family Communication: No family at bedside, discussed with patient Disposition Plan: Pending continued improvement in condition improved heart rate   Consultants:  None  Procedures:  None  Antibiotics:  Was on vancomycin and Zosyn since admission DC 08/12/2013  Start Bactrim 08/12/2013  HPI/Subjective: Patient has no new complaints. No acute issues reported to me overnight. Patient denies any chest pain or shortness of breath  Objective: Filed Vitals:   08/12/13 1324  BP: 118/66  Pulse: 94  Temp: 97.7 F (36.5 C)  Resp: 20    Intake/Output Summary (Last 24 hours) at 08/12/13 1424 Last data filed at 08/12/13 1325  Gross per 24 hour  Intake    930 ml  Output      0 ml  Net    930 ml   Filed Weights   08/10/13 0500 08/11/13 0400 08/12/13 1214  Weight: 93.7 kg (206 lb 9.1 oz) 94.7 kg (208 lb 12.4 oz) 95 kg (209 lb 7 oz)    Exam:   General:  Pt in NAD, alert and awake  Cardiovascular: irregularly irregular, no murmurs  Respiratory: CTA BL, no wheezes  Abdomen: soft, NT, ND  Musculoskeletal: no cyanosis or clubbing  Skin:  LLE cellulitis  Data Reviewed: Basic Metabolic Panel:  Recent Labs Lab 08/10/13 0015 08/10/13 0630 08/11/13 0350 08/12/13 0507  NA 140 140 138 138  K 3.1* 3.1* 3.3* 3.1*  CL 101 102 100 101  CO2 _0 GLUCOSE 146* 143* 118* 100*  BUN 28* 24* 19 14  CREATININE 1.01 0.98 1.03 0.93  CALCIUM 9.5 9.0 8.9 8.9   Liver Function Tests:  Recent Labs Lab 08/10/13 0015 08/10/13 0630  AST 14 14  ALT 8 8  ALKPHOS 126* 120*  BILITOT 1.1 1.0  PROT 7.3 7.0  ALBUMIN 3.2* 2.8*   No results found for this basename: LIPASE, AMYLASE,  in the last 168 hours  Recent Labs Lab 08/10/13 0015  AMMONIA 26   CBC:  Recent Labs Lab 08/10/13 0015 08/10/13 0630 08/11/13 0350 08/12/13 0507  WBC 23.4* 20.3* 15.4* 10.2  NEUTROABS 21.7* 18.7*  --   --   HGB 12.8 12.5 12.0 12.1  HCT 38.9 38.1 36.6 37.0  MCV 89.0 89.4 90.1 89.8  PLT 22* 22*  17* 34* 53*   Cardiac Enzymes:  Recent Labs Lab 08/10/13 0630  CKTOTAL 117  TROPONINI <0.30   BNP (last 3 results)  Recent Labs  08/10/13 0630  PROBNP 2308.0*   CBG:  Recent Labs Lab 08/10/13 1149 08/11/13 1156  GLUCAP 183* 113*    Recent Results (from the past 240 hour(s))  CULTURE, BLOOD (SINGLE)     Status: None  Collection Time    08/10/13 12:15 AM      Result Value Range Status   Specimen Description BLOOD LEFT HAND   Final   Special Requests BOTTLES DRAWN AEROBIC AND ANAEROBIC 5CC   Final   Culture  Setup Time     Final   Value: 08/10/2013 10:41     Performed at [facility]   Culture     Final   Value:        BLOOD CULTURE RECEIVED NO GROWTH TO DATE CULTURE WILL BE HELD FOR 5 DAYS BEFORE ISSUING A FINAL NEGATIVE REPORT     Performed at [facility]   Report Status PENDING   Incomplete  MRSA PCR SCREENING     Status: None   Collection Time    08/10/13  5:58 AM      Result Value Range Status   MRSA by PCR NEGATIVE  NEGATIVE Final   Comment:            The GeneXpert MRSA Assay (FDA      approved for NASAL specimens     only), is one component of a     comprehensive MRSA colonization     surveillance program. It is not     intended to diagnose MRSA     infection nor to guide or     monitor treatment for     MRSA infections.     Studies: Dg Femur Left  08/10/2013   CLINICAL DATA:  Left leg red and swollen.  EXAM: LEFT FEMUR - 2 VIEW  COMPARISON:  None.  FINDINGS: No fracture or dislocation.  There are advanced arthropathic changes of the hip with marked superior lateral joint space narrowing, subchondral sclerosis and subchondral cystic change. There also advanced degenerative changes of the knee. The bones are diffusely demineralized. There is a small knee joint effusion. The soft tissues are otherwise unremarkable.  IMPRESSION: No fracture or dislocation.  Advanced hip and knee joint arthropathic changes and diffuse bony demineralization.   Electronically Signed   By: Lajean Manes M.D.   On: 08/10/2013 17:18   Dg Tibia/fibula Left  08/10/2013   CLINICAL DATA:  Left lower extremity swelling.  EXAM: LEFT TIBIA AND FIBULA - 2 VIEW  COMPARISON:  None.  FINDINGS: No fracture or dislocation is noted. No lytic destruction is seen to suggest osteomyelitis. No soft tissue abnormality is noted. Degenerative joint disease of the left knee is noted. Diffuse osteopenia is noted.  IMPRESSION: No fracture or dislocation is noted.  No evidence of osteomyelitis.   Electronically Signed   By: Sabino Dick M.D.   On: 08/10/2013 17:20    Scheduled Meds: . acidophilus  1 capsule Oral Daily  . atorvastatin  80 mg Oral Daily  . capsicum oleoresin  1 application Topical QID  . diltiazem  60 mg Oral Q8H  . dimethicone  1 application Topical BID  . fluticasone  2 spray Each Nare Daily  . gabapentin  800 mg Oral QID  . metoprolol tartrate  75 mg Oral BID  . morphine  30 mg Oral Q12H  . saccharomyces boulardii  250 mg Oral TID  . sodium chloride  3 mL Intravenous Q12H  .  sulfamethoxazole-trimethoprim  1 tablet Oral Q12H  . Ticagrelor  90 mg Oral BID   Continuous Infusions:   Principal Problem:   Sepsis Active Problems:   Thrombocytopenia   Hypokalemia   Cellulitis of leg   Atrial fibrillation with RVR  Leukocytosis, unspecified    Time spent: > 35 minutes    Velvet Bathe  [facility] Pager [phone]. If 7PM-7AM, please contact night-coverage at [email or web address], password [facility] 08/12/2013, 2:24 PM  LOS: 3 days

## 2013-08-13 LAB — BASIC METABOLIC PANEL
BUN: 11 mg/dL (ref 6–23)
CO2: 25 mEq/L (ref 19–32)
Calcium: 8.8 mg/dL (ref 8.4–10.5)
GFR calc non Af Amer: 57 mL/min — ABNORMAL LOW (ref >90)
Glucose, Bld: 100 mg/dL — ABNORMAL HIGH (ref 70–99)
Potassium: 3.3 mEq/L — ABNORMAL LOW (ref 3.5–5.1)
Sodium: 138 mEq/L (ref 135–145)

## 2013-08-13 LAB — CBC
HCT: 38.3 % (ref 36.0–46.0)
MCH: 28.7 pg (ref 26.0–34.0)
MCHC: 32.1 g/dL (ref 30.0–36.0)
MCV: 89.5 fL (ref 78.0–100.0)
Platelets: 74 10*3/uL — ABNORMAL LOW (ref 150–400)
RBC: 4.28 MIL/uL (ref 3.87–5.11)
WBC: 9.2 10*3/uL (ref 4.0–10.5)

## 2013-08-13 MED ORDER — POTASSIUM CHLORIDE CRYS ER 20 MEQ PO TBCR
40.0000 meq | EXTENDED_RELEASE_TABLET | Freq: Once | ORAL | Status: AC
  Administered 2013-08-13: 18:00:00 40 meq via ORAL
  Filled 2013-08-13: qty 2

## 2013-08-13 MED ORDER — DILTIAZEM HCL 60 MG PO TABS
60.0000 mg | ORAL_TABLET | Freq: Four times a day (QID) | ORAL | Status: DC
  Administered 2013-08-13 – 2013-08-14 (×4): 60 mg via ORAL
  Filled 2013-08-13 (×8): qty 1

## 2013-08-13 MED ORDER — RIVAROXABAN 15 MG PO TABS: 15.0000 mg | ORAL_TABLET | Freq: Every day | ORAL | Status: DC

## 2013-08-13 MED ORDER — RIVAROXABAN 15 MG PO TABS
15.0000 mg | ORAL_TABLET | Freq: Every day | ORAL | Status: DC
  Administered 2013-08-13 – 2013-08-14 (×2): 15 mg via ORAL
  Filled 2013-08-13 (×3): qty 1

## 2013-08-13 NOTE — Progress Notes (Signed)
[facility] PROGRESS NOTE  [name] [name] XHF:[id number] DOB: 27-Jul-1931 DOA: 08/09/2013 PCP: Mathews Argyle, MD  Assessment/Plan:  A Fib with RVR  - Was initially on diltiazem drip.  Was successfully transitioned off of the cardizem gtt - Heart rate has still been elevated. As such will increased cardizem po frequency - Will increase PO cardizem dose and continue IV lopressor as needed.  - With resolution of infection. Suspect heart rate will be easier controlled - Pharmacy consult for xarelto administration.   Right Leg Cellulitis  - Appears clinically improved.  - Leukocytosis improving.  - xrays negative for osteomyelitis or gas.  - Patient has been afebrile and WBC within normal limits on last check  Severe Thrombocytopenia  -Patient has a known h/o chronic idiopathic thrombocytopenia.  -Plt are improved today at 34.   CAD  -s/p CABD.  -Denies CP.  Hypokalemia - Will replace orally and reassess next a.m.  Code Status: Full code Family Communication: No family at bedside, discussed with patient Disposition Plan: Pending continued improvement in condition improved heart rate   Consultants:  None  Procedures:  None  Antibiotics:  Was on vancomycin and Zosyn since admission DC 08/12/2013  Started Bactrim 08/12/2013  HPI/Subjective: Patient has no new complaints. No acute issues reported to me overnight. Patient denies any chest pain or shortness of breath. Patient is sad she can't go home today.  I discussed with her that her heart rate was still elevated and required better control and answered her questions to her satisfaction.  Objective: Filed Vitals:   08/13/13 1431  BP: 138/81  Pulse: 112  Temp: 98.4 F (36.9 C)  Resp: 20    Intake/Output Summary (Last 24 hours) at 08/13/13 1521 Last data filed at 08/13/13 1300  Gross per 24 hour  Intake    718 ml  Output      0 ml  Net    718 ml   Filed Weights   08/11/13 0400 08/12/13 1214  08/13/13 0700  Weight: 94.7 kg (208 lb 12.4 oz) 95 kg (209 lb 7 oz) 94.348 kg (208 lb)    Exam:   General:  Pt in NAD, alert and awake  Cardiovascular: irregularly irregular, no murmurs  Respiratory: CTA BL, no wheezes  Abdomen: soft, NT, ND  Musculoskeletal: no cyanosis or clubbing  Skin: LLE cellulitis  Data Reviewed: Basic Metabolic Panel:  Recent Labs Lab 08/10/13 0015 08/10/13 0630 08/11/13 0350 08/12/13 0507 08/13/13 0430  NA 140 140 138 138 138  K 3.1* 3.1* 3.3* 3.1* 3.3*  CL 101 102 100 101 103  CO2 _0 GLUCOSE 146* 143* 118* 100* 100*  BUN 28* 24* _1 CREATININE 1.01 0.98 1.03 0.93 0.92  CALCIUM 9.5 9.0 8.9 8.9 8.8   Liver Function Tests:  Recent Labs Lab 08/10/13 0015 08/10/13 0630  AST 14 14  ALT 8 8  ALKPHOS 126* 120*  BILITOT 1.1 1.0  PROT 7.3 7.0  ALBUMIN 3.2* 2.8*   No results found for this basename: LIPASE, AMYLASE,  in the last 168 hours  Recent Labs Lab 08/10/13 0015  AMMONIA 26   CBC:  Recent Labs Lab 08/10/13 0015 08/10/13 0630 08/11/13 0350 08/12/13 0507 08/13/13 0430  WBC 23.4* 20.3* 15.4* 10.2 9.2  NEUTROABS 21.7* 18.7*  --   --   --   HGB 12.8 12.5 12.0 12.1 12.3  HCT 38.9 38.1 36.6 37.0 38.3  MCV 89.0 89.4 90.1 89.8 89.5  PLT  22* 22*  17* 34* 53* 74*   Cardiac Enzymes:  Recent Labs Lab 08/10/13 0630  CKTOTAL 117  TROPONINI <0.30   BNP (last 3 results)  Recent Labs  08/10/13 0630  PROBNP 2308.0*   CBG:  Recent Labs Lab 08/10/13 1149 08/11/13 1156  GLUCAP 183* 113*    Recent Results (from the past 240 hour(s))  CULTURE, BLOOD (SINGLE)     Status: None   Collection Time    08/10/13 12:15 AM      Result Value Range Status   Specimen Description BLOOD LEFT HAND   Final   Special Requests BOTTLES DRAWN AEROBIC AND ANAEROBIC 5CC   Final   Culture  Setup Time     Final   Value: 08/10/2013 10:41     Performed at [facility]   Culture     Final   Value:        BLOOD  CULTURE RECEIVED NO GROWTH TO DATE CULTURE WILL BE HELD FOR 5 DAYS BEFORE ISSUING A FINAL NEGATIVE REPORT     Performed at [facility]   Report Status PENDING   Incomplete  MRSA PCR SCREENING     Status: None   Collection Time    08/10/13  5:58 AM      Result Value Range Status   MRSA by PCR NEGATIVE  NEGATIVE Final   Comment:            The GeneXpert MRSA Assay (FDA     approved for NASAL specimens     only), is one component of a     comprehensive MRSA colonization     surveillance program. It is not     intended to diagnose MRSA     infection nor to guide or     monitor treatment for     MRSA infections.     Studies: No results found.  Scheduled Meds: . acidophilus  1 capsule Oral Daily  . atorvastatin  80 mg Oral Daily  . capsicum oleoresin  1 application Topical QID  . diltiazem  60 mg Oral Q6H  . dimethicone  1 application Topical BID  . fluticasone  2 spray Each Nare Daily  . gabapentin  800 mg Oral QID  . metoprolol tartrate  75 mg Oral BID  . morphine  30 mg Oral Q12H  . saccharomyces boulardii  250 mg Oral TID  . sodium chloride  3 mL Intravenous Q12H  . sulfamethoxazole-trimethoprim  1 tablet Oral Q12H  . Ticagrelor  90 mg Oral BID   Continuous Infusions:   Principal Problem:   Sepsis Active Problems:   Thrombocytopenia   Hypokalemia   Cellulitis of leg   Atrial fibrillation with RVR   Leukocytosis, unspecified    Time spent: > 35 minutes    Velvet Bathe  [facility] Pager [phone]. If 7PM-7AM, please contact night-coverage at [email or web address], password [facility] 08/13/2013, 3:21 PM  LOS: 4 days

## 2013-08-13 NOTE — Progress Notes (Signed)
MEDICATION RELATED CONSULT NOTE - INITIAL   Pharmacy Consult for Xarelto  Indication: Afib with RVR  No Known Allergies  Patient Measurements: Height: _0  (162.6 cm) Weight: 208 lb (94.348 kg) IBW/kg (Calculated) : 54.7  Vital Signs: Temp: 98.4 F (36.9 C) (11/27 1431) Temp src: Oral (11/27 1431) BP: 138/81 mmHg (11/27 1431) Pulse Rate: 112 (11/27 1431) Intake/Output from previous day: 11/26 0701 - 11/27 0700 In: 598 [P.O.:598] Out: -  Intake/Output from this shift: Total I/O In: 600 [P.O.:600] Out: -   Labs:  Recent Labs  08/11/13 0350 08/12/13 0507 08/13/13 0430  WBC 15.4* 10.2 9.2  HGB 12.0 12.1 12.3  HCT 36.6 37.0 38.3  PLT 34* 53* 74*  CREATININE 1.03 0.93 0.92   Estimated Creatinine Clearance: 54.3 ml/min (by C-G formula based on Cr of 0.92).  Microbiology: Recent Results (from the past 720 hour(s))  CULTURE, BLOOD (ROUTINE X 2)     Status: None   Collection Time    07/16/13  1:50 PM      Result Value Range Status   Specimen Description BLOOD LEFT ANTECUBITAL   Final   Special Requests BOTTLES DRAWN AEROBIC AND ANAEROBIC 5CC   Final   Culture  Setup Time     Final   Value: 07/16/2013 21:58     Performed at [facility]   Culture     Final   Value: NO GROWTH 5 DAYS     Performed at [facility]   Report Status 07/22/2013 FINAL   Final  CULTURE, BLOOD (ROUTINE X 2)     Status: None   Collection Time    07/16/13  3:30 PM      Result Value Range Status   Specimen Description BLOOD RIGHT FOREARM   Final   Special Requests BOTTLES DRAWN AEROBIC AND ANAEROBIC 5CC   Final   Culture  Setup Time     Final   Value: 07/16/2013 21:58     Performed at [facility]   Culture     Final   Value: NO GROWTH 5 DAYS     Performed at [facility]   Report Status 07/22/2013 FINAL   Final  MRSA PCR SCREENING     Status: None   Collection Time    07/19/13  2:16 AM      Result Value Range Status   MRSA by PCR NEGATIVE  NEGATIVE  Final   Comment:            The GeneXpert MRSA Assay (FDA     approved for NASAL specimens     only), is one component of a     comprehensive MRSA colonization     surveillance program. It is not     intended to diagnose MRSA     infection nor to guide or     monitor treatment for     MRSA infections.  CULTURE, BLOOD (SINGLE)     Status: None   Collection Time    08/10/13 12:15 AM      Result Value Range Status   Specimen Description BLOOD LEFT HAND   Final   Special Requests BOTTLES DRAWN AEROBIC AND ANAEROBIC 5CC   Final   Culture  Setup Time     Final   Value: 08/10/2013 10:41     Performed at [facility]   Culture     Final   Value:        BLOOD CULTURE RECEIVED NO GROWTH  TO DATE CULTURE WILL BE HELD FOR 5 DAYS BEFORE ISSUING A FINAL NEGATIVE REPORT     Performed at [facility]   Report Status PENDING   Incomplete  MRSA PCR SCREENING     Status: None   Collection Time    08/10/13  5:58 AM      Result Value Range Status   MRSA by PCR NEGATIVE  NEGATIVE Final   Comment:            The GeneXpert MRSA Assay (FDA     approved for NASAL specimens     only), is one component of a     comprehensive MRSA colonization     surveillance program. It is not     intended to diagnose MRSA     infection nor to guide or     monitor treatment for     MRSA infections.    Medical History: Past Medical History  Diagnosis Date  . HTN (hypertension)   . DJD (degenerative joint disease)   . Osteopenia   . Dyslipidemia   . Venous ulcer   . Thrombocytopenia   . CAD (coronary artery disease)     a. NSTEMI 2/14 => LHC 10/20/12:  dLM 20%, pLAD 99%, mLAD 60% followed by 99%, oD1 99%, and pD2 30%, mild plaque disease in the CFX and RCA. PCI: Xience Xpedition DES to the proximal and mid LAD  . Hx of echocardiogram     a. Echo 10/18/12: mod LVH, EF 60-65%, Gr 1 diast dysfn, mild AI, mild LAE.   Medications:  Scheduled:  . acidophilus  1 capsule Oral Daily  . atorvastatin  80  mg Oral Daily  . capsicum oleoresin  1 application Topical QID  . diltiazem  60 mg Oral Q6H  . dimethicone  1 application Topical BID  . fluticasone  2 spray Each Nare Daily  . gabapentin  800 mg Oral QID  . metoprolol tartrate  75 mg Oral BID  . morphine  30 mg Oral Q12H  . potassium chloride  40 mEq Oral Once  . Rivaroxaban  15 mg Oral Q supper  . Rivaroxaban  15 mg Oral Q supper  . saccharomyces boulardii  250 mg Oral TID  . sodium chloride  3 mL Intravenous Q12H  . sulfamethoxazole-trimethoprim  1 tablet Oral Q12H  . Ticagrelor  90 mg Oral BID    Assessment: 82 yoF in Afib with RVR; hx of NSTEMI 2/14 with stent. On Ticagrelor, resumed after being held for low Plt (74 K today). Rivaroxaban ordered for Afib, noting age, renal function and concurrent Ticagrelor: will reduce dose to 59m daily (from 240mdaily)  Goal of Therapy:  Rivaroxaban dosing appropriate for patient parameters  Plan:   Xarelto 1530maily with supper  Monitor for bleeding, follow CBC  GreMinda DittoarmD Pager [phone]/27/2014, 4:41 PM

## 2013-08-14 LAB — BASIC METABOLIC PANEL
BUN: 9 mg/dL (ref 6–23)
CO2: 21 mEq/L (ref 19–32)
Calcium: 8.9 mg/dL (ref 8.4–10.5)
Creatinine, Ser: 0.89 mg/dL (ref 0.50–1.10)
GFR calc non Af Amer: 60 mL/min — ABNORMAL LOW (ref >90)
Glucose, Bld: 149 mg/dL — ABNORMAL HIGH (ref 70–99)
Sodium: 138 mEq/L (ref 135–145)

## 2013-08-14 MED ORDER — DILTIAZEM HCL ER COATED BEADS 240 MG PO CP24
240.0000 mg | ORAL_CAPSULE | Freq: Every day | ORAL | Status: DC
  Administered 2013-08-14 – 2013-08-18 (×5): 240 mg via ORAL
  Filled 2013-08-14 (×6): qty 1

## 2013-08-14 MED ORDER — VITAMINS A & D EX OINT
TOPICAL_OINTMENT | CUTANEOUS | Status: AC
  Administered 2013-08-14: 15:00:00
  Filled 2013-08-14: qty 5

## 2013-08-14 MED ORDER — MORPHINE SULFATE ER 15 MG PO TBCR
15.0000 mg | EXTENDED_RELEASE_TABLET | Freq: Once | ORAL | Status: AC
  Administered 2013-08-14: 12:00:00 15 mg via ORAL
  Filled 2013-08-14: qty 1

## 2013-08-14 MED ORDER — MORPHINE SULFATE ER 30 MG PO TBCR
45.0000 mg | EXTENDED_RELEASE_TABLET | Freq: Two times a day (BID) | ORAL | Status: DC
  Administered 2013-08-14 – 2013-08-18 (×8): 45 mg via ORAL
  Filled 2013-08-14 (×17): qty 1

## 2013-08-14 MED ORDER — POLYVINYL ALCOHOL 1.4 % OP SOLN
1.0000 [drp] | OPHTHALMIC | Status: DC | PRN
  Administered 2013-08-15 – 2013-08-18 (×4): 1 [drp] via OPHTHALMIC
  Filled 2013-08-14: qty 15

## 2013-08-14 MED ORDER — OXYCODONE HCL 5 MG PO TABS
5.0000 mg | ORAL_TABLET | Freq: Four times a day (QID) | ORAL | Status: DC | PRN
  Administered 2013-08-14 – 2013-08-18 (×14): 5 mg via ORAL
  Filled 2013-08-14 (×14): qty 1

## 2013-08-14 NOTE — Progress Notes (Signed)
PT Cancellation Note  ___Treatment cancelled today due to medical issues with patient which prohibited therapy  ___ Treatment cancelled today due to patient receiving procedure or test   ___ Treatment cancelled today due to patient's refusal to participate   _X_ Pt stated she has already been OOB in recliner earlier today and to [facility].  Requested to rest this afternoon.  Waiting to return to [facility].  Rica Koyanagi  PTA [facility]  Acute  Rehab Pager      [phone]

## 2013-08-14 NOTE — Progress Notes (Signed)
[facility] PROGRESS NOTE  [name] [name] PFX:[id number] DOB: [birth year] DOA: 08/09/2013 PCP: Mathews Argyle, MD  Assessment/Plan:  A Fib with RVR  - Was initially on diltiazem drip.  Was successfully transitioned off of the cardizem gtt - Heart rate has still been elevated. Has been as high as 112 of the last 24 hours - Change Cardizem to long-acting 08/14/2013 - With resolution of infection. Suspect heart rate will be easier controlled - Pharmacy consult for xarelto administration.   Right Leg Cellulitis  - Appears clinically improved.  - Leukocytosis resolved - xrays negative for osteomyelitis or gas.  - Patient has been afebrile and WBC within normal limits on last check - Currently tolerating Bactrim serum creatinine 0.89  Severe Thrombocytopenia  -Patient has a known h/o chronic idiopathic thrombocytopenia.  -Plt are improved today at 74 most likely secondary to infectious etiology which is getting resolved  CAD  -s/p CABD.  -Denies CP.  Hypokalemia - Resolved after oral replacement  Code Status: Full code Family Communication: No family at bedside, discussed with patient Disposition Plan: Pending continued improvement in condition improved heart rate   Consultants:  None  Procedures:  None  Antibiotics:  Was on vancomycin and Zosyn since admission DC 08/12/2013  Started Bactrim 08/12/2013  HPI/Subjective: She has no new complaints other than difficulty sleeping. Does not report any shortness of breath or chest discomfort.  Objective: Filed Vitals:   08/14/13 0509  BP: 136/77  Pulse: 104  Temp: 98.9 F (37.2 C)  Resp: 21    Intake/Output Summary (Last 24 hours) at 08/14/13 1123 Last data filed at 08/13/13 2300  Gross per 24 hour  Intake    600 ml  Output      0 ml  Net    600 ml   Filed Weights   08/12/13 1214 08/13/13 0700 08/14/13 0509  Weight: 95 kg (209 lb 7 oz) 94.348 kg (208 lb) 96.8 kg (213 lb 6.5 oz)     Exam:   General:  Pt in NAD, alert and awake  Cardiovascular: irregularly irregular, no murmurs  Respiratory: CTA BL, no wheezes  Abdomen: soft, NT, ND  Musculoskeletal: no cyanosis or clubbing  Skin: LLE cellulitis  Data Reviewed: Basic Metabolic Panel:  Recent Labs Lab 08/10/13 0630 08/11/13 0350 08/12/13 0507 08/13/13 0430 08/14/13 0417  NA 140 138 138 138 138  K 3.1* 3.3* 3.1* 3.3* 4.1  CL 102 100 101 103 105  CO2 _0 GLUCOSE 143* 118* 100* 100* 149*  BUN 24* _1 CREATININE 0.98 1.03 0.93 0.92 0.89  CALCIUM 9.0 8.9 8.9 8.8 8.9   Liver Function Tests:  Recent Labs Lab 08/10/13 0015 08/10/13 0630  AST 14 14  ALT 8 8  ALKPHOS 126* 120*  BILITOT 1.1 1.0  PROT 7.3 7.0  ALBUMIN 3.2* 2.8*   No results found for this basename: LIPASE, AMYLASE,  in the last 168 hours  Recent Labs Lab 08/10/13 0015  AMMONIA 26   CBC:  Recent Labs Lab 08/10/13 0015 08/10/13 0630 08/11/13 0350 08/12/13 0507 08/13/13 0430  WBC 23.4* 20.3* 15.4* 10.2 9.2  NEUTROABS 21.7* 18.7*  --   --   --   HGB 12.8 12.5 12.0 12.1 12.3  HCT 38.9 38.1 36.6 37.0 38.3  MCV 89.0 89.4 90.1 89.8 89.5  PLT 22* 22*  17* 34* 53* 74*   Cardiac Enzymes:  Recent Labs Lab 08/10/13 0630  CKTOTAL 117  TROPONINI <0.30  BNP (last 3 results)  Recent Labs  08/10/13 0630  PROBNP 2308.0*   CBG:  Recent Labs Lab 08/10/13 1149 08/11/13 1156  GLUCAP 183* 113*    Recent Results (from the past 240 hour(s))  CULTURE, BLOOD (SINGLE)     Status: None   Collection Time    08/10/13 12:15 AM      Result Value Range Status   Specimen Description BLOOD LEFT HAND   Final   Special Requests BOTTLES DRAWN AEROBIC AND ANAEROBIC 5CC   Final   Culture  Setup Time     Final   Value: 08/10/2013 10:41     Performed at [facility]   Culture     Final   Value:        BLOOD CULTURE RECEIVED NO GROWTH TO DATE CULTURE WILL BE HELD FOR 5 DAYS BEFORE ISSUING A FINAL  NEGATIVE REPORT     Performed at [facility]   Report Status PENDING   Incomplete  MRSA PCR SCREENING     Status: None   Collection Time    08/10/13  5:58 AM      Result Value Range Status   MRSA by PCR NEGATIVE  NEGATIVE Final   Comment:            The GeneXpert MRSA Assay (FDA     approved for NASAL specimens     only), is one component of a     comprehensive MRSA colonization     surveillance program. It is not     intended to diagnose MRSA     infection nor to guide or     monitor treatment for     MRSA infections.     Studies: No results found.  Scheduled Meds: . acidophilus  1 capsule Oral Daily  . atorvastatin  80 mg Oral Daily  . capsicum oleoresin  1 application Topical QID  . diltiazem  240 mg Oral Daily  . dimethicone  1 application Topical BID  . fluticasone  2 spray Each Nare Daily  . gabapentin  800 mg Oral QID  . metoprolol tartrate  75 mg Oral BID  . morphine  30 mg Oral Q12H  . Rivaroxaban  15 mg Oral Q supper  . saccharomyces boulardii  250 mg Oral TID  . sodium chloride  3 mL Intravenous Q12H  . sulfamethoxazole-trimethoprim  1 tablet Oral Q12H  . Ticagrelor  90 mg Oral BID   Continuous Infusions:   Principal Problem:   Sepsis Active Problems:   Thrombocytopenia   Hypokalemia   Cellulitis of leg   Atrial fibrillation with RVR   Leukocytosis, unspecified    Time spent: > 35 minutes    Velvet Bathe  [facility] Pager [phone]. If 7PM-7AM, please contact night-coverage at [email or web address], password [facility] 08/14/2013, 11:23 AM  LOS: 5 days

## 2013-08-15 ENCOUNTER — Encounter (HOSPITAL_COMMUNITY): Payer: Self-pay | Admitting: Cardiology

## 2013-08-15 DIAGNOSIS — L03119 Cellulitis of unspecified part of limb: Secondary | ICD-10-CM

## 2013-08-15 DIAGNOSIS — L02419 Cutaneous abscess of limb, unspecified: Secondary | ICD-10-CM

## 2013-08-15 DIAGNOSIS — A419 Sepsis, unspecified organism: Secondary | ICD-10-CM

## 2013-08-15 DIAGNOSIS — D72829 Elevated white blood cell count, unspecified: Secondary | ICD-10-CM

## 2013-08-15 DIAGNOSIS — I200 Unstable angina: Secondary | ICD-10-CM

## 2013-08-15 LAB — TSH: TSH: 2.129 u[IU]/mL (ref 0.350–4.500)

## 2013-08-15 MED ORDER — METOPROLOL TARTRATE 25 MG PO TABS
75.0000 mg | ORAL_TABLET | Freq: Two times a day (BID) | ORAL | Status: DC
  Administered 2013-08-15: 75 mg via ORAL
  Filled 2013-08-15 (×3): qty 1

## 2013-08-15 MED ORDER — METOPROLOL TARTRATE 100 MG PO TABS
100.0000 mg | ORAL_TABLET | Freq: Two times a day (BID) | ORAL | Status: DC
  Filled 2013-08-15: qty 1

## 2013-08-15 MED ORDER — ASPIRIN 81 MG PO CHEW
81.0000 mg | CHEWABLE_TABLET | Freq: Every day | ORAL | Status: DC
  Administered 2013-08-15 – 2013-08-16 (×2): 81 mg via ORAL
  Filled 2013-08-15 (×3): qty 1

## 2013-08-15 NOTE — Consult Note (Signed)
Cardiologist - Hochrein  HPI: 79 year old female with past medical history of coronary artery disease, chronic thrombocytopenia admitted with cellulitis of lower extremity for evaluation of atrial fibrillation. Patient had a cardiac catheterization in January of 2014 for chest pain. She had a 99% proximal LAD followed by a 60% and then a 99% after the second diagonal. There is a small-caliber first diagonal with an ostial 99% lesion. No disease in the circumflex or right coronary artery. Patient had a drug-eluting stent at that time. Patient admitted on November 24 with lower extremity cellulitis and also noted to be in atrial fibrillation with a rapid ventricular response. She has been treated with Cardizem and her rate remains mildly elevated. Cardiology is asked to evaluate. She denies dyspnea, chest pain, palpitations or syncope. Her cellulitis is improving.  Medications Prior to Admission  Medication Sig Dispense Refill  . acetaminophen (TYLENOL) 325 MG tablet Take 650 mg by mouth every 4 (four) hours as needed for pain. To stop 07/21/13      . amLODipine (NORVASC) 10 MG tablet Take 10 mg by mouth daily.      Marland Kitchen aspirin 81 MG chewable tablet Chew 81 mg by mouth daily.      Marland Kitchen atorvastatin (LIPITOR) 80 MG tablet Take 80 mg by mouth daily. RX is written for 6pm. Nursing Home administers at 8am      . Calcium Carbonate-Vitamin D (CALCARB 600/D PO) Take 2 tablets by mouth daily.      . capsicum oleoresin (TRIXAICIN) 0.025 % cream Apply 1 application topically 4 (four) times daily. To hip      . collagenase (SANTYL) ointment Apply 1 application topically as needed (dressing changes).      . dimethicone (REMEDY NUTRASHIELD) 1 % cream Apply 1 application topically 2 (two) times daily. To heels and legs      . fluticasone (FLONASE) 50 MCG/ACT nasal spray Place 2 sprays into the nose daily.      . furosemide (LASIX) 80 MG tablet Take 80 mg by mouth 2 (two) times daily.       Marland Kitchen gabapentin (NEURONTIN) 800  MG tablet Take 800 mg by mouth 4 (four) times daily.      . Lactobacillus Acid-Pectin (ACIDOPHILUS/CITRUS PECTIN) TABS Take 1 tablet by mouth daily.      . metoprolol tartrate (LOPRESSOR) 25 MG tablet Take 75 mg by mouth 2 (two) times daily.      . mineral oil-hydrophilic petrolatum (AQUAPHOR) ointment Apply 1 application topically as needed for dry skin. knees      . mupirocin ointment (BACTROBAN) 2 % Apply 1 application topically as needed (with dressing changes).      . ondansetron (ZOFRAN) 4 MG tablet Take 4 mg by mouth every 8 (eight) hours as needed for nausea.      . Oxycodone HCl 20 MG TABS Take 1 tablet (20 mg total) by mouth every 6 (six) hours as needed (pain).  30 tablet  0  . Polyethyl Glycol-Propyl Glycol (SYSTANE OP) Place 1 drop into both eyes 2 (two) times daily as needed (dry eyes).      . potassium chloride SA (K-DUR,KLOR-CON) 20 MEQ tablet Take 20 mEq by mouth 2 (two) times daily.      . Ticagrelor (BRILINTA) 90 MG TABS tablet Take 1 tablet (90 mg total) by mouth 2 (two) times daily.  60 tablet  0  . morphine (MS CONTIN) 30 MG 12 hr tablet Take 30 mg by mouth every 12 (twelve) hours.      Marland Kitchen  saccharomyces boulardii (FLORASTOR) 250 MG capsule Take 250 mg by mouth 3 (three) times daily.       Marland Kitchen sulfamethoxazole-trimethoprim (BACTRIM DS) 800-160 MG per tablet Take 1 tablet by mouth 2 (two) times daily.  12 tablet  0    No Known Allergies  Past Medical History  Diagnosis Date  . HTN (hypertension)   . DJD (degenerative joint disease)   . Osteopenia   . Dyslipidemia   . Venous ulcer   . Thrombocytopenia   . CAD (coronary artery disease)     a. NSTEMI 2/14 => LHC 10/20/12:  dLM 20%, pLAD 99%, mLAD 60% followed by 99%, oD1 99%, and pD2 30%, mild plaque disease in the CFX and RCA. PCI: Xience Xpedition DES to the proximal and mid LAD    Past Surgical History  Procedure Laterality Date  . Back surgery      Lumbar disc  . Hip surgery      R THR  . Eye surgery       Blepharoplasty  . Insertion / placement / revision neurostimulator    . Joint replacement      History   Social History  . Marital Status: Divorced    Spouse Name: N/A    Number of Children: 2  . Years of Education: N/A   Occupational History  .     Social History Main Topics  . Smoking status: Never Smoker   . Smokeless tobacco: Never Used  . Alcohol Use: No  . Drug Use: No  . Sexual Activity: Not on file   Other Topics Concern  . Not on file   Social History Narrative   Lives at [place].  Two adopted children. Divorced.      Family History  Problem Relation Age of Onset  . CAD Father 32    Vague history     ROS:  Recent fever and chills and LLE pain/erythema but no productive cough, hemoptysis, dysphasia, odynophagia, melena, hematochezia, dysuria, hematuria, rash, seizure activity, orthopnea, PND, claudication. Remaining systems are negative.  Physical Exam:   Blood pressure 141/81, pulse 118, temperature 98.4 F (36.9 C), temperature source Oral, resp. rate 24, height _0  (1.626 m), weight 213 lb 3 oz (96.7 kg), SpO2 92.00%.  General:  Well developed/obese in NAD Skin warm/dry Patient not depressed No peripheral clubbing Back-normal HEENT-normal/normal eyelids Neck supple/normal carotid upstroke bilaterally; no bruits; no JVD; no thyromegaly chest - CTA/ normal expansion CV - irregular/normal S1 and S2; no murmurs, rubs or gallops;  PMI nondisplaced Abdomen -NT/ND, no HSM, no mass, + bowel sounds, no bruit 2+ femoral pulses, no bruits Ext-1-2+ edema, no chords, chronic skin changes; erythema (L>R). Neuro-grossly nonfocal  ECG 08/09/13 atrial fibrillation with PVCs or aberrantly conducted beats. Cannot rule out prior septal infarct. Nonspecific ST changes.  Results for orders placed during the hospital encounter of 08/09/13 (from the past 48 hour(s))  BASIC METABOLIC PANEL     Status: Abnormal   Collection Time    08/14/13  4:17 AM      Result  Value Range   Sodium 138  135 - 145 mEq/L   Potassium 4.1  3.5 - 5.1 mEq/L   Comment: MODERATE HEMOLYSIS     HEMOLYSIS AT THIS LEVEL MAY AFFECT RESULT     DELTA CHECK NOTED   Chloride 105  96 - 112 mEq/L   CO2 21  19 - 32 mEq/L   Glucose, Bld 149 (*) 70 - 99 mg/dL   BUN 9  6 - 23 mg/dL   Creatinine, Ser 0.89  0.50 - 1.10 mg/dL   Calcium 8.9  8.4 - 10.5 mg/dL   GFR calc non Af Amer 60 (*) >90 mL/min   GFR calc Af Amer 69 (*) >90 mL/min   Comment: (NOTE)     The eGFR has been calculated using the CKD EPI equation.     This calculation has not been validated in all clinical situations.     eGFR's persistently <90 mL/min signify possible Chronic Kidney     Disease.    Assessment/Plan 1 atrial fibrillation-duration is unknown. Plan to continue Cardizem at present dose as well as metoprolol. Follow on telemetry and increase medications as needed. She does have embolic risk factors of age greater than 69, hypertension, female sex and coronary artery disease. However I am concerned about continuing xeralto and brilinta in setting of chronic thrombocytopenia. Patient states that her platelet count typically runs in the 50,000 range. Therefore discontinue xeralto and continue brilinta and ecasa 81 mg daily. Hopefully atrial fibrillation is related to recent sepsis and she will convert to sinus rhythm on her own. If not we could consider short-term anticoagulation in the future followed by cardioversion. Patient should fu with Dr Percival Spanish 2 weeks after DC. Note LV function is normal. Check TSH. 2 cellulitis-continue antibiotics. 3 CAD - continue ASA, brilinta and statin. Dual antiplt therapy should be continued for 1 year following DES. 4 chronic thrombocytopenia  Kirk Ruths MD 08/15/2013, 1:30 PM

## 2013-08-15 NOTE — Progress Notes (Signed)
[facility] PROGRESS NOTE  [name] [name] KDT:[id number] DOB: Jan 30, 1931 DOA: 08/09/2013 PCP: Mathews Argyle, MD  Assessment/Plan:  A Fib with RVR  - Was initially on diltiazem drip.  Was successfully transitioned off of the cardizem gtt - Heart rate has still been elevated. Has been as high as 118 of the last 24 hours - Change Cardizem to long-acting 08/14/2013 - With resolution of infection. Suspect heart rate will be easier controlled - Pharmacy consult for xarelto administration. - Given that patient continues to be in atrial fibrillation with rvr intermittently despite multiple attempts at rate control on my end will plan on consulting cardiology for further evaluation and recommendations.  Right Leg Cellulitis  - Appears clinically improved.  - Leukocytosis resolved - xrays negative for osteomyelitis or gas.  - Patient has been afebrile and WBC within normal limits on last check - Currently tolerating Bactrim last serum creatinine 0.89  Severe Thrombocytopenia  -Patient has a known h/o chronic idiopathic thrombocytopenia.  -Plt are improved today at 74 most likely secondary to infectious etiology which is getting resolved  CAD  -s/p CABD.  -Denies CP.  Hypokalemia - Resolved after oral replacement  Code Status: Full code Family Communication: No family at bedside, discussed with patient Disposition Plan: Pending continued improvement in condition improved heart rate   Consultants:  None  Procedures:  None  Antibiotics:  Was on vancomycin and Zosyn since admission DC 08/12/2013  Started Bactrim 08/12/2013  HPI/Subjective: Denies any chest pain or SOB. Leg feels better. No new complaints.  Objective: Filed Vitals:   08/15/13 0539  BP: 141/81  Pulse: 118  Temp: 98.4 F (36.9 C)  Resp: 24    Intake/Output Summary (Last 24 hours) at 08/15/13 1002 Last data filed at 08/15/13 0441  Gross per 24 hour  Intake    720 ml  Output   1201 ml   Net   -481 ml   Filed Weights   08/13/13 0700 08/14/13 0509 08/15/13 0539  Weight: 94.348 kg (208 lb) 96.8 kg (213 lb 6.5 oz) 96.7 kg (213 lb 3 oz)    Exam:   General:  Pt in NAD, alert and awake  Cardiovascular: irregularly irregular, no murmurs  Respiratory: CTA BL, no wheezes  Abdomen: soft, NT, ND  Musculoskeletal: no cyanosis or clubbing  Skin: LLE cellulitis  Data Reviewed: Basic Metabolic Panel:  Recent Labs Lab 08/10/13 0630 08/11/13 0350 08/12/13 0507 08/13/13 0430 08/14/13 0417  NA 140 138 138 138 138  K 3.1* 3.3* 3.1* 3.3* 4.1  CL 102 100 101 103 105  CO2 _0 GLUCOSE 143* 118* 100* 100* 149*  BUN 24* _1 CREATININE 0.98 1.03 0.93 0.92 0.89  CALCIUM 9.0 8.9 8.9 8.8 8.9   Liver Function Tests:  Recent Labs Lab 08/10/13 0015 08/10/13 0630  AST 14 14  ALT 8 8  ALKPHOS 126* 120*  BILITOT 1.1 1.0  PROT 7.3 7.0  ALBUMIN 3.2* 2.8*   No results found for this basename: LIPASE, AMYLASE,  in the last 168 hours  Recent Labs Lab 08/10/13 0015  AMMONIA 26   CBC:  Recent Labs Lab 08/10/13 0015 08/10/13 0630 08/11/13 0350 08/12/13 0507 08/13/13 0430  WBC 23.4* 20.3* 15.4* 10.2 9.2  NEUTROABS 21.7* 18.7*  --   --   --   HGB 12.8 12.5 12.0 12.1 12.3  HCT 38.9 38.1 36.6 37.0 38.3  MCV 89.0 89.4 90.1 89.8 89.5  PLT 22* 22*  17* 34* 53* 74*   Cardiac Enzymes:  Recent Labs Lab 08/10/13 0630  CKTOTAL 117  TROPONINI <0.30   BNP (last 3 results)  Recent Labs  08/10/13 0630  PROBNP 2308.0*   CBG:  Recent Labs Lab 08/10/13 1149 08/11/13 1156  GLUCAP 183* 113*    Recent Results (from the past 240 hour(s))  CULTURE, BLOOD (SINGLE)     Status: None   Collection Time    08/10/13 12:15 AM      Result Value Range Status   Specimen Description BLOOD LEFT HAND   Final   Special Requests BOTTLES DRAWN AEROBIC AND ANAEROBIC 5CC   Final   Culture  Setup Time     Final   Value: 08/10/2013 10:41     Performed at  [facility]   Culture     Final   Value:        BLOOD CULTURE RECEIVED NO GROWTH TO DATE CULTURE WILL BE HELD FOR 5 DAYS BEFORE ISSUING A FINAL NEGATIVE REPORT     Performed at [facility]   Report Status PENDING   Incomplete  MRSA PCR SCREENING     Status: None   Collection Time    08/10/13  5:58 AM      Result Value Range Status   MRSA by PCR NEGATIVE  NEGATIVE Final   Comment:            The GeneXpert MRSA Assay (FDA     approved for NASAL specimens     only), is one component of a     comprehensive MRSA colonization     surveillance program. It is not     intended to diagnose MRSA     infection nor to guide or     monitor treatment for     MRSA infections.     Studies: No results found.  Scheduled Meds: . acidophilus  1 capsule Oral Daily  . atorvastatin  80 mg Oral Daily  . capsicum oleoresin  1 application Topical QID  . diltiazem  240 mg Oral Daily  . dimethicone  1 application Topical BID  . fluticasone  2 spray Each Nare Daily  . gabapentin  800 mg Oral QID  . metoprolol tartrate  100 mg Oral BID  . morphine  45 mg Oral Q12H  . Rivaroxaban  15 mg Oral Q supper  . saccharomyces boulardii  250 mg Oral TID  . sodium chloride  3 mL Intravenous Q12H  . sulfamethoxazole-trimethoprim  1 tablet Oral Q12H  . Ticagrelor  90 mg Oral BID   Continuous Infusions:   Principal Problem:   Sepsis Active Problems:   Thrombocytopenia   Hypokalemia   Cellulitis of leg   Atrial fibrillation with RVR   Leukocytosis, unspecified    Time spent: > 35 minutes    Velvet Bathe  [facility] Pager [phone]. If 7PM-7AM, please contact night-coverage at [email or web address], password [facility] 08/15/2013, 10:02 AM  LOS: 6 days

## 2013-08-16 DIAGNOSIS — R609 Edema, unspecified: Secondary | ICD-10-CM

## 2013-08-16 LAB — CBC
HCT: 37 % (ref 36.0–46.0)
MCHC: 33 g/dL (ref 30.0–36.0)
MCV: 89.2 fL (ref 78.0–100.0)
Platelets: 121 10*3/uL — ABNORMAL LOW (ref 150–400)
RDW: 14.9 % (ref 11.5–15.5)

## 2013-08-16 LAB — BASIC METABOLIC PANEL
BUN: 10 mg/dL (ref 6–23)
Calcium: 8.6 mg/dL (ref 8.4–10.5)
Creatinine, Ser: 0.91 mg/dL (ref 0.50–1.10)
GFR calc Af Amer: 67 mL/min — ABNORMAL LOW (ref >90)
GFR calc non Af Amer: 58 mL/min — ABNORMAL LOW (ref >90)
Potassium: 4.2 mEq/L (ref 3.5–5.1)

## 2013-08-16 LAB — CULTURE, BLOOD (SINGLE): Culture: NO GROWTH

## 2013-08-16 MED ORDER — FUROSEMIDE 40 MG PO TABS
40.0000 mg | ORAL_TABLET | Freq: Two times a day (BID) | ORAL | Status: DC
  Administered 2013-08-16 – 2013-08-18 (×5): 40 mg via ORAL
  Filled 2013-08-16 (×7): qty 1

## 2013-08-16 MED ORDER — METOPROLOL TARTRATE 100 MG PO TABS
100.0000 mg | ORAL_TABLET | Freq: Two times a day (BID) | ORAL | Status: DC
  Administered 2013-08-16 – 2013-08-18 (×5): 100 mg via ORAL
  Filled 2013-08-16 (×6): qty 1

## 2013-08-16 NOTE — Progress Notes (Signed)
[facility] PROGRESS NOTE  [name] [name] TZG:[id number] DOB: July 02, 1931 DOA: 08/09/2013 PCP: Mathews Argyle, MD  Assessment/Plan: A Fib with RVR -11/24 ProBNP = 2308 most likely secondary to A. fib with RVR since echocardiogram shows no structural abnormalities - Was initially on diltiazem drip. Was successfully transitioned off of the cardizem gtt  - Heart rate has still been elevated. Now within normal limits   - Change Cardizem 240 mg to long-acting 08/14/2013  - With resolution of infection. Suspect heart rate will be easier controlled  - Per cardiology will use aspirin + present Brilinta  for anticoagulation.  -Fluid overload Will aggressively diureses patient Lasix 40 mg BID. Would also consider and Zaroxolyn home. -Strict in and out. -Daily a.m. weight on scale    Right Leg Cellulitis  - Mild bump in WBC, patient clinically is improved, continue to monitor   - xrays negative for osteomyelitis or gas.  - Continue to be afebrile.   - Currently tolerating Bactrim last serum creatinine 0.91   Severe Thrombocytopenia  -Patient has a known h/o chronic idiopathic thrombocytopenia.  -Plt are improved today at 121 most likely secondary to infectious etiology which is getting resolved   CAD  -s/p CABG.  -Denies CP.   Hypokalemia  - Resolved after oral replacement -Goal for K.> 4    Code Status: Full Family Communication:  Disposition Plan:    Consultants: Cardio  Procedures: Echocardiogram 08/10/2013 -Left ventricle: The cavity size was normal. mild LVH.  -LVEF= 60% to 65%.    Antibiotics:  Bactrim DS 11/26>>    HPI/Subjective: [name] [name] is a 79 y.o.WF PMHx cholecystitis, thrombocytopenia, HTN, chronic narcotic dependence, CABG, PCI (NSTEMI 2/14 => LHC 10/20/12: DES to the proximal and mid LAD ). Brought to the ER after patient was complaining of weakness. In addition patient noticed increased redness of the left lower extremity. In the ER  patient was found to be in atrial fibrillation and RVR and was started on Cardizem infusion. Patient in addition is also found to be febrile with leukocytosis. On exam patient has erythema of the left lower extremity extending up to the thigh. Patient states that her weakness and the left lower extremity redness started yesterday and has quickly progress to the left thigh. Denies any chest pain shortness of breath nausea vomiting abdominal pain diarrhea. Patient denies any pain in the left lower extremity. Patient also has erythema in the right lower extremity which patient states has been chronic. At this time blood cultures have been obtained and patient has been started on empiric antibiotics for sepsis from cellulitis of the left lower extremity. Patient was admitted for sepsis in April of this year from cholecystitis. Patient was just discharged recently 3 weeks ago after being treated for left lower extremity cellulitis and patient states at that time after discharge she did well. TODAY states negative CP, negative SOB, LLE pain with palpation, stage chronic bilateral lower extremity edema.   Objective: Filed Vitals:   08/16/13 0500 08/16/13 0520 08/16/13 1300 08/16/13 1327  BP:  131/61  128/61  Pulse:  100  98  Temp:  98.3 F (36.8 C)  98.3 F (36.8 C)  TempSrc:  Oral  Oral  Resp:      Height:      Weight: 95.709 kg (211 lb)  94.348 kg (208 lb)   SpO2:  93%  95%    Intake/Output Summary (Last 24 hours) at 08/16/13 1402 Last data filed at 08/16/13 1328  Gross per 24  hour  Intake    940 ml  Output   3200 ml  Net  -2260 ml   Filed Weights   08/15/13 0539 08/16/13 0500 08/16/13 1300  Weight: 96.7 kg (213 lb 3 oz) 95.709 kg (211 lb) 94.348 kg (208 lb)    Exam:   General: A./O. x4, NAD*  Cardiovascular: Irregular irregular rhythm and rate, negative murmurs rubs gallops, DP/PT nonpalpable bilateral  Respiratory: Clear to auscultation bilateral  Abdomen: Nontender, nondistended,  plus bowel sounds  Musculoskeletal: Bilateral erythema to mid shin. Bilateral pedal edema Left leg> right leg. Left leg warm to touch, painful palpation. Bilateral lower extremity warm to touch, good capillary refill  Data Reviewed: Basic Metabolic Panel:  Recent Labs Lab 08/11/13 0350 08/12/13 0507 08/13/13 0430 08/14/13 0417 08/16/13 0435  NA 138 138 138 138 136  K 3.3* 3.1* 3.3* 4.1 4.2  CL 100 101 103 105 104  CO2 _0 GLUCOSE 118* 100* 100* 149* 91  BUN _1 CREATININE 1.03 0.93 0.92 0.89 0.91  CALCIUM 8.9 8.9 8.8 8.9 8.6   Liver Function Tests:  Recent Labs Lab 08/10/13 0015 08/10/13 0630  AST 14 14  ALT 8 8  ALKPHOS 126* 120*  BILITOT 1.1 1.0  PROT 7.3 7.0  ALBUMIN 3.2* 2.8*   No results found for this basename: LIPASE, AMYLASE,  in the last 168 hours  Recent Labs Lab 08/10/13 0015  AMMONIA 26   CBC:  Recent Labs Lab 08/10/13 0015 08/10/13 0630 08/11/13 0350 08/12/13 0507 08/13/13 0430 08/16/13 0435  WBC 23.4* 20.3* 15.4* 10.2 9.2 11.0*  NEUTROABS 21.7* 18.7*  --   --   --   --   HGB 12.8 12.5 12.0 12.1 12.3 12.2  HCT 38.9 38.1 36.6 37.0 38.3 37.0  MCV 89.0 89.4 90.1 89.8 89.5 89.2  PLT 22* 22*  17* 34* 53* 74* 121*   Cardiac Enzymes:  Recent Labs Lab 08/10/13 0630  CKTOTAL 117  TROPONINI <0.30   BNP (last 3 results)  Recent Labs  08/10/13 0630  PROBNP 2308.0*   CBG:  Recent Labs Lab 08/10/13 1149 08/11/13 1156  GLUCAP 183* 113*    Recent Results (from the past 240 hour(s))  CULTURE, BLOOD (SINGLE)     Status: None   Collection Time    08/10/13 12:15 AM      Result Value Range Status   Specimen Description BLOOD LEFT HAND   Final   Special Requests BOTTLES DRAWN AEROBIC AND ANAEROBIC 5CC   Final   Culture  Setup Time     Final   Value: 08/10/2013 10:41     Performed at [facility]   Culture     Final   Value: NO GROWTH 5 DAYS     Performed at [facility]   Report Status  08/16/2013 FINAL   Final  MRSA PCR SCREENING     Status: None   Collection Time    08/10/13  5:58 AM      Result Value Range Status   MRSA by PCR NEGATIVE  NEGATIVE Final   Comment:            The GeneXpert MRSA Assay (FDA     approved for NASAL specimens     only), is one component of a     comprehensive MRSA colonization     surveillance program. It is not     intended to diagnose MRSA  infection nor to guide or     monitor treatment for     MRSA infections.     Studies: No results found.  Scheduled Meds: . acidophilus  1 capsule Oral Daily  . aspirin  81 mg Oral Daily  . atorvastatin  80 mg Oral Daily  . capsicum oleoresin  1 application Topical QID  . diltiazem  240 mg Oral Daily  . dimethicone  1 application Topical BID  . fluticasone  2 spray Each Nare Daily  . furosemide  40 mg Oral BID  . gabapentin  800 mg Oral QID  . metoprolol tartrate  100 mg Oral BID  . morphine  45 mg Oral Q12H  . saccharomyces boulardii  250 mg Oral TID  . sodium chloride  3 mL Intravenous Q12H  . sulfamethoxazole-trimethoprim  1 tablet Oral Q12H  . Ticagrelor  90 mg Oral BID   Continuous Infusions:   Principal Problem:   Sepsis Active Problems:   Thrombocytopenia   Hypokalemia   Cellulitis of leg   Atrial fibrillation with RVR   Leukocytosis, unspecified    Time spent: 35 minutes   WOODS, CURTIS, J  [facility] Pager [phone]. If 7PM-7AM, please contact night-coverage at [email or web address], password [facility] 08/16/2013, 2:02 PM  LOS: 7 days

## 2013-08-16 NOTE — Progress Notes (Signed)
Physical Therapy Treatment Patient Details Name: [name] [name] MRN: [id number] DOB: November 13, 1930 Today's Date: 08/16/2013 Time: 4098-2867 PT Time Calculation (min): 17 min  PT Assessment / Plan / Recommendation  History of Present Illness 79 y.o. female admitted to [facility] for afib with RVR and R LE cellulitis and has a history of chronic venous insufficiency, multiple back surgeries, and chronic pain    PT Comments     Follow Up Recommendations  No PT follow up     Does the patient have the potential to tolerate intense rehabilitation     Barriers to Discharge        Equipment Recommendations  None recommended by PT    Recommendations for Other Services    Frequency Min 2X/week   Progress towards PT Goals Progress towards PT goals: Progressing toward goals  Plan Current plan remains appropriate    Precautions / Restrictions Precautions Precautions: Fall Restrictions Weight Bearing Restrictions: No   Pertinent Vitals/Pain bil LEs 5/10.     Mobility  Bed Mobility Bed Mobility: Supine to Sit;Sit to Supine Supine to Sit: 4: Min assist;HOB elevated Sit to Supine: 4: Min assist;HOB elevated Details for Bed Mobility Assistance: min assist to progress legs into and out of bed.  Once sitting, pt uses railing to scoot.   Transfers Transfers: Stand Pivot Transfers Sit to Stand: 4: Min guard Stand to Sit: 4: Min guard Stand Pivot Transfers: 4: Min guard Details for Transfer Assistance: Pt performs a modified stand pivot- knees are straight but she maintains flexed trunk/hip posture. Uses armrests of recliner, BSC for support. Stand pivot x 3.  Ambulation/Gait Ambulation/Gait Assistance: Not tested (comment) Ambulation/Gait Assistance Details: Pt states she does not really walk.     Exercises     PT Diagnosis:    PT Problem List:   PT Treatment Interventions:     PT Goals (current goals can now be found in the care plan section)    Visit Information  Last PT Received  On: 08/16/13 Assistance Needed: +1 History of Present Illness: 79 y.o. female admitted to [facility] for afib with RVR and R LE cellulitis and has a history of chronic venous insufficiency, multiple back surgeries, and chronic pain     Subjective Data      Cognition  Cognition Arousal/Alertness: Awake/alert Behavior During Therapy: WFL for tasks assessed/performed Overall Cognitive Status: Within Functional Limits for tasks assessed    Balance     End of Session PT - End of Session Activity Tolerance: Patient tolerated treatment well Patient left: in bed;with call bell/phone within reach   GP     Weston Anna, MPT Pager: [phone]

## 2013-08-16 NOTE — Progress Notes (Addendum)
    Subjective:  Denies CP or dyspnea   Objective:  Filed Vitals:   08/15/13 1417 08/15/13 2043 08/16/13 0500 08/16/13 0520  BP: 137/80 137/88  131/61  Pulse: 98 97  100  Temp: 97.8 F (36.6 C) 97.9 F (36.6 C)  98.3 F (36.8 C)  TempSrc: Oral Oral  Oral  Resp: 20 20    Height:      Weight:   211 lb (95.709 kg)   SpO2: 98% 97%  93%    Intake/Output from previous day:  Intake/Output Summary (Last 24 hours) at 08/16/13 3276 Last data filed at 08/15/13 1727  Gross per 24 hour  Intake    800 ml  Output   1100 ml  Net   -300 ml    Physical Exam: Physical exam: Well-developed well-nourished in no acute distress.  Skin is warm and dry.  HEENT is normal.  Neck is supple.  Chest with mild basilar crackles Cardiovascular exam is irregular Abdominal exam nontender or distended. No masses palpated. Extremities show 1+ edema. Chronic skin changes. neuro grossly intact    Lab Results: Basic Metabolic Panel:  Recent Labs  08/14/13 0417 08/16/13 0435  NA 138 136  K 4.1 4.2  CL 105 104  CO2 21 21  GLUCOSE 149* 91  BUN 9 10  CREATININE 0.89 0.91  CALCIUM 8.9 8.6   CBC:  Recent Labs  08/16/13 0435  WBC 11.0*  HGB 12.2  HCT 37.0  MCV 89.2  PLT 121*    Assessment/Plan:  1 atrial fibrillation-duration is unknown. Plan to continue Cardizem at present dose; change metoprolol to 100 BID (rate improving). Follow on telemetry and increase medications as needed. She does have embolic risk factors of age greater than 33, hypertension, female sex and coronary artery disease. However, patient had DES to LAD 10/20/12 and should be on dual antiplt therapy for one year; plt count improved this AM but typically runs approximately 50k. I am concerned about using dual antiplt therapy or brilinta alone in combination with coumadin or NOAC in setting of chronic thrombocytopenia. Therefore continue brilinta and ecasa 81 mg daily. Hopefully atrial fibrillation is related to recent  sepsis and she will convert to sinus rhythm on her own. If not we could consider short-term anticoagulation in the future followed by cardioversion. Patient should fu with Dr Percival Spanish 2 weeks after DC. Note LV function and THS normal.  2 cellulitis-continue antibiotics.  3 CAD - continue ASA, brilinta and statin. Dual antiplt therapy should be continued for 1 year following DES if possible.  4 chronic thrombocytopenia 5 Acute on chronic diastolic CHF- patient volume overloaded; add lasix 40 BID and follow renal function.   [name] [name] 08/16/2013, 7:13 AM

## 2013-08-17 LAB — CBC
HCT: 39.5 % (ref 36.0–46.0)
Hemoglobin: 12.8 g/dL (ref 12.0–15.0)
MCH: 28.9 pg (ref 26.0–34.0)
MCV: 89.2 fL (ref 78.0–100.0)
RBC: 4.43 MIL/uL (ref 3.87–5.11)
RDW: 15 % (ref 11.5–15.5)

## 2013-08-17 LAB — COMPREHENSIVE METABOLIC PANEL
AST: 10 U/L (ref 0–37)
Albumin: 2.8 g/dL — ABNORMAL LOW (ref 3.5–5.2)
Calcium: 8.5 mg/dL (ref 8.4–10.5)
Creatinine, Ser: 1.13 mg/dL — ABNORMAL HIGH (ref 0.50–1.10)
Sodium: 136 mEq/L (ref 135–145)

## 2013-08-17 MED ORDER — WARFARIN SODIUM 5 MG PO TABS
5.0000 mg | ORAL_TABLET | Freq: Once | ORAL | Status: AC
  Administered 2013-08-17: 5 mg via ORAL
  Filled 2013-08-17 (×2): qty 1

## 2013-08-17 MED ORDER — CLOPIDOGREL BISULFATE 300 MG PO TABS
300.0000 mg | ORAL_TABLET | Freq: Once | ORAL | Status: AC
  Administered 2013-08-17: 09:00:00 300 mg via ORAL
  Filled 2013-08-17: qty 1

## 2013-08-17 MED ORDER — DOXYCYCLINE HYCLATE 100 MG PO TABS
100.0000 mg | ORAL_TABLET | Freq: Two times a day (BID) | ORAL | Status: DC
Start: 2013-08-17 — End: 2013-08-18
  Administered 2013-08-17 – 2013-08-18 (×3): 100 mg via ORAL
  Filled 2013-08-17 (×5): qty 1

## 2013-08-17 MED ORDER — CLOPIDOGREL BISULFATE 75 MG PO TABS
75.0000 mg | ORAL_TABLET | Freq: Every day | ORAL | Status: DC
  Administered 2013-08-18: 75 mg via ORAL
  Filled 2013-08-17 (×2): qty 1

## 2013-08-17 MED ORDER — WARFARIN - PHARMACIST DOSING INPATIENT: Freq: Every day | Status: DC

## 2013-08-17 MED ORDER — ENOXAPARIN SODIUM 100 MG/ML ~~LOC~~ SOLN
1.0000 mg/kg | Freq: Two times a day (BID) | SUBCUTANEOUS | Status: DC
  Administered 2013-08-17 – 2013-08-18 (×2): 95 mg via SUBCUTANEOUS
  Filled 2013-08-17 (×4): qty 1

## 2013-08-17 NOTE — Progress Notes (Signed)
[facility] PROGRESS NOTE  [name] [name] KKD:[id number] DOB: 04-07-1931 DOA: 08/09/2013 PCP: Mathews Argyle, MD  Assessment/Plan:  A Fib with RVR  - Defer management to cardiology who is currently on board last seen by Dr. Martinique. - Will initiate Coumadin therapy with Lovenox bridging - Rate is currently controlled with beta blocker and Cardizem  Right Leg Cellulitis  - Appears clinically improved.  - Leukocytosis resolved - xrays negative for osteomyelitis or gas.  - Given that patient will be started on Coumadin will this discontinue Bactrim will be started on doxycycline. This was discussed with the pharmacist   CAD  -s/p CABD.  -Denies CP.  Hypokalemia - Resolved after oral replacement  Code Status: Full code Family Communication: No family at bedside, discussed with patient Disposition Plan: Pending continued improvement in condition improved heart rate   Consultants:  None  Procedures:  None  Antibiotics:  Was on vancomycin and Zosyn since admission DC 08/12/2013  Started Bactrim 08/12/2013>>>12/ 1/14  HPI/Subjective: Denies any chest pain or SOB. Leg feels better. No new complaints.  Objective: Filed Vitals:   08/17/13 0411  BP: 137/82  Pulse: 98  Temp: 98 F (36.7 C)  Resp: 18    Intake/Output Summary (Last 24 hours) at 08/17/13 1146 Last data filed at 08/17/13 1127  Gross per 24 hour  Intake    660 ml  Output   1950 ml  Net  -1290 ml   Filed Weights   08/16/13 0500 08/16/13 1300 08/17/13 0412  Weight: 95.709 kg (211 lb) 94.348 kg (208 lb) 93.26 kg (205 lb 9.6 oz)    Exam:   General:  Pt in NAD, alert and awake  Cardiovascular: irregularly irregular, no murmurs  Respiratory: CTA BL, no wheezes  Abdomen: soft, NT, ND  Musculoskeletal: no cyanosis or clubbing  Skin: LLE cellulitis  Data Reviewed: Basic Metabolic Panel:  Recent Labs Lab 08/12/13 0507 08/13/13 0430 08/14/13 0417 08/16/13 0435 08/17/13 0450   NA 138 138 138 136 136  K 3.1* 3.3* 4.1 4.2 3.7  CL 101 103 105 104 100  CO2 _0 GLUCOSE 100* 100* 149* 91 122*  BUN _1 CREATININE 0.93 0.92 0.89 0.91 1.13*  CALCIUM 8.9 8.8 8.9 8.6 8.5  MG  --   --   --   --  1.9   Liver Function Tests:  Recent Labs Lab 08/17/13 0450  AST 10  ALT 6  ALKPHOS 101  BILITOT 0.4  PROT 7.0  ALBUMIN 2.8*   No results found for this basename: LIPASE, AMYLASE,  in the last 168 hours No results found for this basename: AMMONIA,  in the last 168 hours CBC:  Recent Labs Lab 08/11/13 0350 08/12/13 0507 08/13/13 0430 08/16/13 0435 08/17/13 0450  WBC 15.4* 10.2 9.2 11.0* 9.6  HGB 12.0 12.1 12.3 12.2 12.8  HCT 36.6 37.0 38.3 37.0 39.5  MCV 90.1 89.8 89.5 89.2 89.2  PLT 34* 53* 74* 121* 140*   Cardiac Enzymes: No results found for this basename: CKTOTAL, CKMB, CKMBINDEX, TROPONINI,  in the last 168 hours BNP (last 3 results)  Recent Labs  08/10/13 0630  PROBNP 2308.0*   CBG:  Recent Labs Lab 08/10/13 1149 08/11/13 1156  GLUCAP 183* 113*    Recent Results (from the past 240 hour(s))  CULTURE, BLOOD (SINGLE)     Status: None   Collection Time    08/10/13 12:15 AM      Result Value  Range Status   Specimen Description BLOOD LEFT HAND   Final   Special Requests BOTTLES DRAWN AEROBIC AND ANAEROBIC 5CC   Final   Culture  Setup Time     Final   Value: 08/10/2013 10:41     Performed at [facility]   Culture     Final   Value: NO GROWTH 5 DAYS     Performed at [facility]   Report Status 08/16/2013 FINAL   Final  MRSA PCR SCREENING     Status: None   Collection Time    08/10/13  5:58 AM      Result Value Range Status   MRSA by PCR NEGATIVE  NEGATIVE Final   Comment:            The GeneXpert MRSA Assay (FDA     approved for NASAL specimens     only), is one component of a     comprehensive MRSA colonization     surveillance program. It is not     intended to diagnose MRSA      infection nor to guide or     monitor treatment for     MRSA infections.     Studies: No results found.  Scheduled Meds: . acidophilus  1 capsule Oral Daily  . atorvastatin  80 mg Oral Daily  . capsicum oleoresin  1 application Topical QID  . [START ON 08/18/2013] clopidogrel  75 mg Oral Q breakfast  . diltiazem  240 mg Oral Daily  . dimethicone  1 application Topical BID  . doxycycline  100 mg Oral Q12H  . enoxaparin (LOVENOX) injection  1 mg/kg Subcutaneous Q12H  . fluticasone  2 spray Each Nare Daily  . furosemide  40 mg Oral BID  . gabapentin  800 mg Oral QID  . metoprolol tartrate  100 mg Oral BID  . morphine  45 mg Oral Q12H  . saccharomyces boulardii  250 mg Oral TID  . sodium chloride  3 mL Intravenous Q12H  . warfarin  5 mg Oral ONCE-1800  . Warfarin - Pharmacist Dosing Inpatient   Does not apply q1800   Continuous Infusions:   Principal Problem:   Sepsis Active Problems:   HTN (hypertension)   Unstable angina   Thrombocytopenia   CAD (coronary artery disease) of artery bypass graft   Hypokalemia   Chronic narcotic dependence   Cellulitis of leg   Atrial fibrillation with RVR   Leukocytosis, unspecified    Time spent: > 35 minutes    Velvet Bathe  [facility] Pager [phone]. If 7PM-7AM, please contact night-coverage at [email or web address], password [facility] 08/17/2013, 11:46 AM  LOS: 8 days

## 2013-08-17 NOTE — Progress Notes (Signed)
ANTICOAGULATION CONSULT NOTE - Initial Consult  Pharmacy Consult for Warfarin/Lovenox Indication: Atrial Fibrillation   No Known Allergies  Patient Measurements: Height: _0  (162.6 cm) Weight: 205 lb 9.6 oz (93.26 kg) IBW/kg (Calculated) : 54.7  Vital Signs: Temp: 98 F (36.7 C) (12/01 0411) Temp src: Oral (12/01 0411) BP: 137/82 mmHg (12/01 0411) Pulse Rate: 98 (12/01 0411)  Labs:  Recent Labs  08/16/13 0435 08/17/13 0450  HGB 12.2 12.8  HCT 37.0 39.5  PLT 121* 140*  CREATININE 0.91 1.13*    Estimated Creatinine Clearance: 43.9 ml/min (by C-G formula based on Cr of 1.13).   Medical History: Past Medical History  Diagnosis Date  . HTN (hypertension)   . DJD (degenerative joint disease)   . Osteopenia   . Dyslipidemia   . Venous ulcer   . Thrombocytopenia   . CAD (coronary artery disease)     a. NSTEMI 2/14 => LHC 10/20/12:  dLM 20%, pLAD 99%, mLAD 60% followed by 99%, oD1 99%, and pD2 30%, mild plaque disease in the CFX and RCA. PCI: Xience Xpedition DES to the proximal and mid LAD   Assessment: [name] [name] with recent hospital admission for LLE cellulitis presented 11/24 to [facility] with weakness and recurrent erythema of LLE and found to be in atrial fibrillation with RVR.  Pt started on cardizem infusion and empiric antibiotics for sepsis from cellulitis.  PMHx significant for CAD (DES 2/'14) and chronic thrombocytopenia on ASA and ticagrelor PTA.  CHADSVASC = 6.  Pt initially started on Xarelto and received 1 dose on 11/28, then xarelto discontinued due to thrombocytopenia.  Pt has been transitioned to oral diltiazem and metoprolol, ticagrelor changed to clopidogrel, and aspirin discontinued per cardiology.    12/1: Pharmacy consulted to begin Warfarin with goal INR 2.0 - 2.5 and bridge with lovenox.     Thrombocytopenia is resolving. Plts 140K, Hgb WNL.   SCr small increase 0.91-->1.13, CrCl ~[name] ml/min  No baseline PT/INR (xarelto can affect INR)  DDI noted with  bactrim (will cause elevations in INR). Today is D#8 antibiotics.  MD ok with changing to doxycycline to avoid bactrim interaction (although tetracyclines may also cause elevations in INR, but not usually to same extent as bactrim).   Note that today is D#1 plavix (transitioned from ticagrelor) with loading dose.  Will schedule 1st dose of lovenox tonight and will need to monitor closely for s/sxs bleeding.    Remains in atrial fibrillation.   Goal of Therapy:  INR 2.0 - 2.5 Monitor platelets by anticoagulation protocol: Yes   Plan:  Lovenox 34m/kg SQ q 12 hours starting tonight at 2000 Warfarin 583mpo x 1 tonight.   F/u CBC, PT/INR, BMET  CoRalene BathePharmD 08/17/2013, 10:12 AM  Pager: [phone]

## 2013-08-17 NOTE — Progress Notes (Addendum)
TELEMETRY: Reviewed telemetry pt in atrial fibrillation with controlled response: Filed Vitals:   08/16/13 1327 08/16/13 2102 08/17/13 0411 08/17/13 0412  BP: 128/61 143/86 137/82   Pulse: 98 76 98   Temp: 98.3 F (36.8 C) 98.3 F (36.8 C) 98 F (36.7 C)   TempSrc: Oral Oral Oral   Resp:  16 18   Height:      Weight:    205 lb 9.6 oz (93.26 kg)  SpO2: 95% 97% 96%     Intake/Output Summary (Last 24 hours) at 08/17/13 5726 Last data filed at 08/17/13 0422  Gross per 24 hour  Intake    860 ml  Output   4050 ml  Net  -3190 ml    SUBJECTIVE Feels well. No chest pain or SOB. No palpitations.  LABS: Basic Metabolic Panel:  Recent Labs  08/16/13 0435 08/17/13 0450  NA 136 136  K 4.2 3.7  CL 104 100  CO2 21 23  GLUCOSE 91 122*  BUN 10 15  CREATININE 0.91 1.13*  CALCIUM 8.6 8.5  MG  --  1.9   Liver Function Tests:  Recent Labs  08/17/13 0450  AST 10  ALT 6  ALKPHOS 101  BILITOT 0.4  PROT 7.0  ALBUMIN 2.8*   CBC:  Recent Labs  08/16/13 0435 08/17/13 0450  WBC 11.0* 9.6  HGB 12.2 12.8  HCT 37.0 39.5  MCV 89.2 89.2  PLT 121* 140*   Thyroid Function Tests:  Recent Labs  08/15/13 1447  TSH 2.129    Radiology/Studies:  Dg Chest 2 View  08/09/2013   CLINICAL DATA:  Weakness and shortness of breath.  EXAM: CHEST  2 VIEW  COMPARISON:  12/20/2012  FINDINGS: Shallow inspiration. Borderline heart size and pulmonary vascularity may represent early congestive change. No evidence of edema or infiltration. Improving residual bilateral basilar atelectasis. No pneumothorax. Marked degenerative changes in the shoulders. Stable postoperative changes in the spine.  IMPRESSION: Improving bilateral basilar atelectasis. Cardiac enlargement with borderline pulmonary vascularity may represent early congestive change. No infiltration or edema.   Electronically Signed   By: Lucienne Capers M.D.   On: 08/09/2013 23:33   Dg Femur Left  08/10/2013   CLINICAL DATA:  Left  leg red and swollen.  EXAM: LEFT FEMUR - 2 VIEW  COMPARISON:  None.  FINDINGS: No fracture or dislocation.  There are advanced arthropathic changes of the hip with marked superior lateral joint space narrowing, subchondral sclerosis and subchondral cystic change. There also advanced degenerative changes of the knee. The bones are diffusely demineralized. There is a small knee joint effusion. The soft tissues are otherwise unremarkable.  IMPRESSION: No fracture or dislocation.  Advanced hip and knee joint arthropathic changes and diffuse bony demineralization.   Electronically Signed   By: Lajean Manes M.D.   On: 08/10/2013 17:18   Dg Tibia/fibula Left  08/10/2013   CLINICAL DATA:  Left lower extremity swelling.  EXAM: LEFT TIBIA AND FIBULA - 2 VIEW  COMPARISON:  None.  FINDINGS: No fracture or dislocation is noted. No lytic destruction is seen to suggest osteomyelitis. No soft tissue abnormality is noted. Degenerative joint disease of the left knee is noted. Diffuse osteopenia is noted.  IMPRESSION: No fracture or dislocation is noted.  No evidence of osteomyelitis.   Electronically Signed   By: Sabino Dick M.D.   On: 08/10/2013 17:20   Ecg: 08/09/13 atrial fib with RVR, old anteroseptal MI.   Echo:Study Conclusions  Left ventricle: The  cavity size was normal. Wall thickness was increased in a pattern of mild LVH. Systolic function was normal. The estimated ejection fraction was in the range of 60% to 65%.   PHYSICAL EXAM General: Well developed, well nourished, in no acute distress. Head: Normocephalic, atraumatic, sclera non-icteric, no xanthomas, nares are without discharge. Neck: Negative for carotid bruits. JVD not elevated. Lungs: Clear bilaterally to auscultation without wheezes, rales, or rhonchi. Breathing is unlabored. Heart: IRRR S1 S2 without murmurs, rubs, or gallops.  Abdomen: Soft, non-tender, non-distended with normoactive bowel sounds. No hepatomegaly.  Extremities: 1+ edema.   Distal pedal pulses are 2+ and equal bilaterally. Neuro: Alert and oriented X 3. Moves all extremities spontaneously. Psych:  Responds to questions appropriately with a normal affect.  ASSESSMENT AND PLAN: 1. Atrial fibrillation. Rate currently well controlled on diltiazem and metoprolol. Patient is asymptomatic. She has a CHAD/Vasc score of 6 placing her at high risk of embolic event. I think she needs to be anticoagulated. Given prior DES in the LAD in 2/14 I would recommend switching Brilinta to Plavix. Stop ASA. Start coumadin with target INR 2-2.5. Patient does have a history of thrombocytopenia but currently platelet count is 140k. 2. Acute on chronic diastolic CHF. On lasix 40 mg bid. I/O negative 3190 cc. Weight down 8 lbs since 08/14/13. Continue current therapy. Patient was on 80 mg bid at home. 3. CAD s/p DES of LAD 2/14. 4. Chronic thrombocytopenia. Mild. Prior drop probably related to recent infection. 5. Right leg cellulitis. Per primary team.   Principal Problem:   Sepsis Active Problems:   HTN (hypertension)   Unstable angina   Thrombocytopenia   CAD (coronary artery disease) of artery bypass graft   Hypokalemia   Chronic narcotic dependence   Cellulitis of leg   Atrial fibrillation with RVR   Leukocytosis, unspecified    Signed, Meghana Tullo Martin[place] MD,FACC 08/17/2013 7:27 AM

## 2013-08-17 NOTE — Progress Notes (Signed)
CSW continued to follow for return to [facility].  Michaelangelo Mittelman C. [place] MSW, [place]

## 2013-08-18 LAB — COMPREHENSIVE METABOLIC PANEL
ALT: 7 U/L (ref 0–35)
Alkaline Phosphatase: 103 U/L (ref 39–117)
BUN: 16 mg/dL (ref 6–23)
CO2: 25 mEq/L (ref 19–32)
Chloride: 99 mEq/L (ref 96–112)
GFR calc Af Amer: 61 mL/min — ABNORMAL LOW (ref >90)
GFR calc non Af Amer: 52 mL/min — ABNORMAL LOW (ref >90)
Glucose, Bld: 105 mg/dL — ABNORMAL HIGH (ref 70–99)
Potassium: 3.7 mEq/L (ref 3.5–5.1)
Sodium: 133 mEq/L — ABNORMAL LOW (ref 135–145)
Total Bilirubin: 0.3 mg/dL (ref 0.3–1.2)
Total Protein: 7.1 g/dL (ref 6.0–8.3)

## 2013-08-18 LAB — CBC
HCT: 40 % (ref 36.0–46.0)
Hemoglobin: 13.2 g/dL (ref 12.0–15.0)
MCHC: 33 g/dL (ref 30.0–36.0)
Platelets: 110 10*3/uL — ABNORMAL LOW (ref 150–400)
RDW: 14.7 % (ref 11.5–15.5)
WBC: 7.8 10*3/uL (ref 4.0–10.5)

## 2013-08-18 LAB — PROTIME-INR
INR: 1.22 (ref 0.00–1.49)
Prothrombin Time: 15.1 seconds (ref 11.6–15.2)

## 2013-08-18 MED ORDER — POTASSIUM CHLORIDE CRYS ER 20 MEQ PO TBCR: 20.0000 meq | EXTENDED_RELEASE_TABLET | Freq: Every day | ORAL | Status: DC

## 2013-08-18 MED ORDER — CLOPIDOGREL BISULFATE 75 MG PO TABS: 75.0000 mg | ORAL_TABLET | Freq: Every day | ORAL | Status: DC

## 2013-08-18 MED ORDER — WARFARIN VIDEO: Freq: Once | Status: DC

## 2013-08-18 MED ORDER — ENOXAPARIN SODIUM 100 MG/ML ~~LOC~~ SOLN: 1.0000 mg/kg | Freq: Two times a day (BID) | SUBCUTANEOUS | Status: DC

## 2013-08-18 MED ORDER — METOPROLOL TARTRATE 100 MG PO TABS: 100.0000 mg | ORAL_TABLET | Freq: Two times a day (BID) | ORAL | Status: AC

## 2013-08-18 MED ORDER — MORPHINE SULFATE ER 15 MG PO TBCR: 45.0000 mg | EXTENDED_RELEASE_TABLET | Freq: Two times a day (BID) | ORAL | Status: DC

## 2013-08-18 MED ORDER — PATIENT'S GUIDE TO USING COUMADIN BOOK
Freq: Once | Status: DC
  Filled 2013-08-18: qty 1

## 2013-08-18 MED ORDER — DILTIAZEM HCL ER COATED BEADS 240 MG PO CP24: 240.0000 mg | ORAL_CAPSULE | Freq: Every day | ORAL | Status: AC

## 2013-08-18 MED ORDER — OXYCODONE HCL 5 MG PO TABS: 5.0000 mg | ORAL_TABLET | Freq: Four times a day (QID) | ORAL | Status: DC | PRN

## 2013-08-18 MED ORDER — WARFARIN SODIUM 5 MG PO TABS: 5.0000 mg | ORAL_TABLET | Freq: Every day | ORAL | Status: DC

## 2013-08-18 MED ORDER — DOXYCYCLINE HYCLATE 100 MG PO TABS: 100.0000 mg | ORAL_TABLET | Freq: Two times a day (BID) | ORAL | Status: DC

## 2013-08-18 MED ORDER — FUROSEMIDE 40 MG PO TABS: 40.0000 mg | ORAL_TABLET | Freq: Two times a day (BID) | ORAL | Status: DC

## 2013-08-18 NOTE — Progress Notes (Signed)
Patient cleared for discharge. Packet copied and placed in [facility]. CSW met with patient. Patient states that [facility] has transportation to get her. CSW called [name] from [facility]. Their transportation is unable to get patient. CSW informed patient of same. She was disappointed but states that she will have to get an ambulance. She understands that there is no guarantee of payment. ptar called for transportation. CSW called patients son Zenia Resides, informed him of discharge. He states that is great and he is so glad that she is going back.  Eliyas Suddreth C. [place] MSW, [place]

## 2013-08-18 NOTE — Discharge Summary (Signed)
Physician Discharge Summary  [name] [name] OZD:[id number] DOB: June 09, 1931 DOA: 08/09/2013  PCP: Mathews Argyle, MD  Admit date: 08/09/2013 Discharge date: 08/18/2013  Time spent: > 35 minutes  Recommendations for Outpatient Follow-up:  1. F/u with Dr. Percival Spanish in 1-2 weeks or sooner 2. Will need Lovenox bridging to coumadin. Preferably SNF pharmacy to monitor and adjust medications pending SNF protocol 3. May discontinue lovenox once patient's INR at goal (2-3) for more than 24 hours or per SNF protocol 4. Please monitor LLE and decide whether or not to prolong antibiotic therapy for LLE  Discharge Diagnoses:  Principal Problem:   Sepsis Active Problems:   HTN (hypertension)   Unstable angina   Thrombocytopenia   CAD (coronary artery disease) of artery bypass graft   Hypokalemia   Chronic narcotic dependence   Cellulitis of leg   Atrial fibrillation with RVR   Leukocytosis, unspecified   Discharge Condition: Stable  Diet recommendation: full  Filed Weights   08/16/13 1300 08/17/13 0412 08/18/13 0454  Weight: 94.348 kg (208 lb) 93.26 kg (205 lb 9.6 oz) 92.987 kg (205 lb)    History of present illness:  79 y/o with h/o CAD and chronic diastolic HF who presented to the ED complaining of weakness and was found to have Atrial fibrillation with RVR and LLE cellulitis.  Hospital Course:   A Fib with RVR  - Cardiology on board while patient was in house and recommended the following: ASSESSMENT AND PLAN:  1. Atrial fibrillation. Rate currently well controlled on diltiazem and metoprolol. Patient is asymptomatic. She has a CHAD/Vasc score of 6 placing her at high risk of embolic event. I think she needs to be anticoagulated. Given prior DES in the LAD in 2/14 now on Plavix and Coumadin. Pharmacy adjusting. On Lovenox until coumadin therapeutic. Will need follow up with Dr. Percival Spanish post discharge.  2. Acute on chronic diastolic CHF. On lasix 40 mg bid. . Weight down 8  lbs since 08/14/13. Continue current therapy. 3. CAD s/p DES of LAD 2/14.  4. Chronic thrombocytopenia. Mild. Prior drop probably related to recent infection.  5. Right leg cellulitis. Per primary team.  Right Leg Cellulitis  - Appears clinically improved.  - Leukocytosis resolved  - xrays negative for osteomyelitis or gas.  - Given that patient will be started on Coumadin will plan on discharging on doxycycline. While in house patient has received 10 days of antibiotic therapy.  Will plan for 6 more days of antibiotics.   CAD  -s/p CABD.  -Denies CP.   Hypokalemia  - Resolved after oral replacement   Procedures:  None  Consultations:  Cardiology  Discharge Exam: Filed Vitals:   08/18/13 0955  BP: 125/72  Pulse: 95  Temp: 98.1 F (36.7 C)  Resp: 18    General: Pt in NAD, Alert and Awake Cardiovascular: RRR, no MRG Respiratory: CTA BL, no wheezes.  Discharge Instructions  Discharge Orders   Future Orders Complete By Expires   Call MD for:  redness, tenderness, or signs of infection (pain, swelling, redness, odor or green/yellow discharge around incision site)  As directed    Call MD for:  temperature >100.4  As directed    Diet - low sodium heart healthy  As directed    Discharge instructions  As directed    Comments:     Please be sure sure to have patient f/u with Dr. Percival Spanish in 1-2 weeks or sooner should any new concerns arise.  Pharmacy at facility is to  continue lovenox injections until coumadin therapeutic (INR between 2-3) for more than 24 hours or per SNF protocol.  Also pharmacy to dose and monitor coumadin per their SNF protocol   Increase activity slowly  As directed        Medication List    STOP taking these medications       amLODipine 10 MG tablet  Commonly known as:  NORVASC     aspirin 81 MG chewable tablet     REMEDY NUTRASHIELD 1 % cream  Generic drug:  dimethicone     sulfamethoxazole-trimethoprim 800-160 MG per tablet   Commonly known as:  BACTRIM DS     Ticagrelor 90 MG Tabs tablet  Commonly known as:  BRILINTA      TAKE these medications       acetaminophen 325 MG tablet  Commonly known as:  TYLENOL  Take 650 mg by mouth every 4 (four) hours as needed for pain. To stop 07/21/13     ACIDOPHILUS/CITRUS PECTIN Tabs  Take 1 tablet by mouth daily.     atorvastatin 80 MG tablet  Commonly known as:  LIPITOR  Take 80 mg by mouth daily. RX is written for 6pm. Nursing Home administers at [place] 600/D PO  Take 2 tablets by mouth daily.     clopidogrel 75 MG tablet  Commonly known as:  PLAVIX  Take 1 tablet (75 mg total) by mouth daily with breakfast.     collagenase ointment  Commonly known as:  SANTYL  Apply 1 application topically as needed (dressing changes).     diltiazem 240 MG 24 hr capsule  Commonly known as:  CARDIZEM CD  Take 1 capsule (240 mg total) by mouth daily.     doxycycline 100 MG tablet  Commonly known as:  VIBRA-TABS  Take 1 tablet (100 mg total) by mouth every 12 (twelve) hours.     enoxaparin 100 MG/ML injection  Commonly known as:  LOVENOX  Inject 0.95 mLs (95 mg total) into the skin every 12 (twelve) hours.     fluticasone 50 MCG/ACT nasal spray  Commonly known as:  FLONASE  Place 2 sprays into the nose daily.     furosemide 40 MG tablet  Commonly known as:  LASIX  Take 1 tablet (40 mg total) by mouth 2 (two) times daily.     gabapentin 800 MG tablet  Commonly known as:  NEURONTIN  Take 800 mg by mouth 4 (four) times daily.     metoprolol 100 MG tablet  Commonly known as:  LOPRESSOR  Take 1 tablet (100 mg total) by mouth 2 (two) times daily.     mineral oil-hydrophilic petrolatum ointment  Apply 1 application topically as needed for dry skin. knees     morphine 15 MG 12 hr tablet  Commonly known as:  MS CONTIN  Take 3 tablets (45 mg total) by mouth every 12 (twelve) hours.     mupirocin ointment 2 %  Commonly known as:  BACTROBAN  Apply 1  application topically as needed (with dressing changes).     ondansetron 4 MG tablet  Commonly known as:  ZOFRAN  Take 4 mg by mouth every 8 (eight) hours as needed for nausea.     oxyCODONE 5 MG immediate release tablet  Commonly known as:  Oxy IR/ROXICODONE  Take 1 tablet (5 mg total) by mouth every 6 (six) hours as needed for breakthrough pain (pain).     potassium chloride SA  20 MEQ tablet  Commonly known as:  K-DUR,KLOR-CON  Take 1 tablet (20 mEq total) by mouth daily.     saccharomyces boulardii 250 MG capsule  Commonly known as:  FLORASTOR  Take 250 mg by mouth 3 (three) times daily.     SYSTANE OP  Place 1 drop into both eyes 2 (two) times daily as needed (dry eyes).     TRIXAICIN 0.025 % cream  Generic drug:  capsicum oleoresin  Apply 1 application topically 4 (four) times daily. To hip     warfarin 5 MG tablet  Commonly known as:  COUMADIN  Take 1 tablet (5 mg total) by mouth daily.       No Known Allergies    The results of significant diagnostics from this hospitalization (including imaging, microbiology, ancillary and laboratory) are listed below for reference.    Significant Diagnostic Studies: Dg Chest 2 View  08/09/2013   CLINICAL DATA:  Weakness and shortness of breath.  EXAM: CHEST  2 VIEW  COMPARISON:  12/20/2012  FINDINGS: Shallow inspiration. Borderline heart size and pulmonary vascularity may represent early congestive change. No evidence of edema or infiltration. Improving residual bilateral basilar atelectasis. No pneumothorax. Marked degenerative changes in the shoulders. Stable postoperative changes in the spine.  IMPRESSION: Improving bilateral basilar atelectasis. Cardiac enlargement with borderline pulmonary vascularity may represent early congestive change. No infiltration or edema.   Electronically Signed   By: Lucienne Capers M.D.   On: 08/09/2013 23:33   Dg Femur Left  08/10/2013   CLINICAL DATA:  Left leg red and swollen.  EXAM: LEFT  FEMUR - 2 VIEW  COMPARISON:  None.  FINDINGS: No fracture or dislocation.  There are advanced arthropathic changes of the hip with marked superior lateral joint space narrowing, subchondral sclerosis and subchondral cystic change. There also advanced degenerative changes of the knee. The bones are diffusely demineralized. There is a small knee joint effusion. The soft tissues are otherwise unremarkable.  IMPRESSION: No fracture or dislocation.  Advanced hip and knee joint arthropathic changes and diffuse bony demineralization.   Electronically Signed   By: Lajean Manes M.D.   On: 08/10/2013 17:18   Dg Tibia/fibula Left  08/10/2013   CLINICAL DATA:  Left lower extremity swelling.  EXAM: LEFT TIBIA AND FIBULA - 2 VIEW  COMPARISON:  None.  FINDINGS: No fracture or dislocation is noted. No lytic destruction is seen to suggest osteomyelitis. No soft tissue abnormality is noted. Degenerative joint disease of the left knee is noted. Diffuse osteopenia is noted.  IMPRESSION: No fracture or dislocation is noted.  No evidence of osteomyelitis.   Electronically Signed   By: Sabino Dick M.D.   On: 08/10/2013 17:20    Microbiology: Recent Results (from the past 240 hour(s))  CULTURE, BLOOD (SINGLE)     Status: None   Collection Time    08/10/13 12:15 AM      Result Value Range Status   Specimen Description BLOOD LEFT HAND   Final   Special Requests BOTTLES DRAWN AEROBIC AND ANAEROBIC 5CC   Final   Culture  Setup Time     Final   Value: 08/10/2013 10:41     Performed at [facility]   Culture     Final   Value: NO GROWTH 5 DAYS     Performed at [facility]   Report Status 08/16/2013 FINAL   Final  MRSA PCR SCREENING     Status: None   Collection Time  08/10/13  5:58 AM      Result Value Range Status   MRSA by PCR NEGATIVE  NEGATIVE Final   Comment:            The GeneXpert MRSA Assay (FDA     approved for NASAL specimens     only), is one component of a     comprehensive MRSA  colonization     surveillance program. It is not     intended to diagnose MRSA     infection nor to guide or     monitor treatment for     MRSA infections.     Labs: Basic Metabolic Panel:  Recent Labs Lab 08/13/13 0430 08/14/13 0417 08/16/13 0435 08/17/13 0450 08/18/13 0509  NA 138 138 136 136 133*  K 3.3* 4.1 4.2 3.7 3.7  CL 103 105 104 100 99  CO2 _0 GLUCOSE 100* 149* 91 122* 105*  BUN _1 CREATININE 0.92 0.89 0.91 1.13* 0.99  CALCIUM 8.8 8.9 8.6 8.5 8.7  MG  --   --   --  1.9 1.9   Liver Function Tests:  Recent Labs Lab 08/17/13 0450 08/18/13 0509  AST 10 10  ALT 6 7  ALKPHOS 101 103  BILITOT 0.4 0.3  PROT 7.0 7.1  ALBUMIN 2.8* 2.8*   No results found for this basename: LIPASE, AMYLASE,  in the last 168 hours No results found for this basename: AMMONIA,  in the last 168 hours CBC:  Recent Labs Lab 08/12/13 0507 08/13/13 0430 08/16/13 0435 08/17/13 0450 08/18/13 0509  WBC 10.2 9.2 11.0* 9.6 7.8  HGB 12.1 12.3 12.2 12.8 13.2  HCT 37.0 38.3 37.0 39.5 40.0  MCV 89.8 89.5 89.2 89.2 88.3  PLT 53* 74* 121* 140* 110*   Cardiac Enzymes: No results found for this basename: CKTOTAL, CKMB, CKMBINDEX, TROPONINI,  in the last 168 hours BNP: BNP (last 3 results)  Recent Labs  08/10/13 0630  PROBNP 2308.0*   CBG:  Recent Labs Lab 08/11/13 1156  GLUCAP 113*       Signed:  Velvet Bathe  [facility] 08/18/2013, 10:56 AM

## 2013-08-18 NOTE — Progress Notes (Addendum)
TELEMETRY: Reviewed telemetry pt in atrial fibrillation with controlled response: Filed Vitals:   08/17/13 0412 08/17/13 2232 08/18/13 0436 08/18/13 0454  BP:  131/68 134/89   Pulse:  96 90   Temp:  98.3 F (36.8 C) 98.1 F (36.7 C)   TempSrc:  Oral Oral   Resp:  20 20   Height:      Weight: 205 lb 9.6 oz (93.26 kg)   205 lb (92.987 kg)  SpO2:  95% 92%     Intake/Output Summary (Last 24 hours) at 08/18/13 0641 Last data filed at 08/17/13 2200  Gross per 24 hour  Intake    663 ml  Output      0 ml  Net    663 ml    SUBJECTIVE Feels well. No chest pain or SOB. No palpitations. Complains of pain from hips down.  LABS: Basic Metabolic Panel:  Recent Labs  08/17/13 0450 08/18/13 0509  NA 136 133*  K 3.7 3.7  CL 100 99  CO2 23 25  GLUCOSE 122* 105*  BUN 15 16  CREATININE 1.13* 0.99  CALCIUM 8.5 8.7  MG 1.9 1.9   Liver Function Tests:  Recent Labs  08/17/13 0450 08/18/13 0509  AST 10 10  ALT 6 7  ALKPHOS 101 103  BILITOT 0.4 0.3  PROT 7.0 7.1  ALBUMIN 2.8* 2.8*   CBC:  Recent Labs  08/17/13 0450 08/18/13 0509  WBC 9.6 7.8  HGB 12.8 13.2  HCT 39.5 40.0  MCV 89.2 88.3  PLT 140* 110*   Thyroid Function Tests:  Recent Labs  08/15/13 1447  TSH 2.129   INR 1.22   Radiology/Studies:  Dg Chest 2 View  08/09/2013   CLINICAL DATA:  Weakness and shortness of breath.  EXAM: CHEST  2 VIEW  COMPARISON:  12/20/2012  FINDINGS: Shallow inspiration. Borderline heart size and pulmonary vascularity may represent early congestive change. No evidence of edema or infiltration. Improving residual bilateral basilar atelectasis. No pneumothorax. Marked degenerative changes in the shoulders. Stable postoperative changes in the spine.  IMPRESSION: Improving bilateral basilar atelectasis. Cardiac enlargement with borderline pulmonary vascularity may represent early congestive change. No infiltration or edema.   Electronically Signed   By: Lucienne Capers M.D.   On:  08/09/2013 23:33   Dg Femur Left  08/10/2013   CLINICAL DATA:  Left leg red and swollen.  EXAM: LEFT FEMUR - 2 VIEW  COMPARISON:  None.  FINDINGS: No fracture or dislocation.  There are advanced arthropathic changes of the hip with marked superior lateral joint space narrowing, subchondral sclerosis and subchondral cystic change. There also advanced degenerative changes of the knee. The bones are diffusely demineralized. There is a small knee joint effusion. The soft tissues are otherwise unremarkable.  IMPRESSION: No fracture or dislocation.  Advanced hip and knee joint arthropathic changes and diffuse bony demineralization.   Electronically Signed   By: Lajean Manes M.D.   On: 08/10/2013 17:18   Dg Tibia/fibula Left  08/10/2013   CLINICAL DATA:  Left lower extremity swelling.  EXAM: LEFT TIBIA AND FIBULA - 2 VIEW  COMPARISON:  None.  FINDINGS: No fracture or dislocation is noted. No lytic destruction is seen to suggest osteomyelitis. No soft tissue abnormality is noted. Degenerative joint disease of the left knee is noted. Diffuse osteopenia is noted.  IMPRESSION: No fracture or dislocation is noted.  No evidence of osteomyelitis.   Electronically Signed   By: Sabino Dick M.D.   On: 08/10/2013  17:20   Ecg: 08/09/13 atrial fib with RVR, old anteroseptal MI.   Echo:Study Conclusions  Left ventricle: The cavity size was normal. Wall thickness was increased in a pattern of mild LVH. Systolic function was normal. The estimated ejection fraction was in the range of 60% to 65%.   PHYSICAL EXAM General: Well developed, well nourished, in no acute distress. Head: Normal Neck: Negative for carotid bruits. JVD not elevated. Lungs: Clear bilaterally to auscultation without wheezes, rales, or rhonchi. Breathing is unlabored. Heart: IRRR S1 S2 without murmurs, rubs, or gallops.  Abdomen: Soft, non-tender, non-distended with normoactive bowel sounds. No hepatomegaly.  Extremities: 1+ edema. Cellulitis  improving. Distal pedal pulses are 2+ and equal bilaterally. Neuro: Alert and oriented X 3. Moves all extremities spontaneously. Psych:  Responds to questions appropriately with a normal affect.  ASSESSMENT AND PLAN: 1. Atrial fibrillation. Rate currently well controlled on diltiazem and metoprolol. Patient is asymptomatic. She has a CHAD/Vasc score of 6 placing her at high risk of embolic event. I think she needs to be anticoagulated. Given prior DES in the LAD in 2/14 now on Plavix and Coumadin. Pharmacy adjusting. On Lovenox until coumadin therapeutic. Will need follow up with Dr. Percival Spanish post discharge. 2. Acute on chronic diastolic CHF. On lasix 40 mg bid. . Weight down 8 lbs since 08/14/13. Continue current therapy. 3. CAD s/p DES of LAD 2/14. 4. Chronic thrombocytopenia. Mild. Prior drop probably related to recent infection. 5. Right leg cellulitis. Per primary team.   Principal Problem:   Sepsis Active Problems:   HTN (hypertension)   Unstable angina   Thrombocytopenia   CAD (coronary artery disease) of artery bypass graft   Hypokalemia   Chronic narcotic dependence   Cellulitis of leg   Atrial fibrillation with RVR   Leukocytosis, unspecified    Signed, Baby Gieger Martin[place] MD,FACC 08/18/2013 6:41 AM

## 2013-08-28 ENCOUNTER — Ambulatory Visit (INDEPENDENT_AMBULATORY_CARE_PROVIDER_SITE_OTHER): Payer: Medicare Other | Admitting: Physician Assistant

## 2013-08-28 ENCOUNTER — Encounter: Payer: Self-pay | Admitting: Physician Assistant

## 2013-08-28 ENCOUNTER — Telehealth: Payer: Self-pay | Admitting: *Deleted

## 2013-08-28 VITALS — BP 140/70 | HR 83 | Ht 64.0 in | Wt 205.0 lb

## 2013-08-28 DIAGNOSIS — E785 Hyperlipidemia, unspecified: Secondary | ICD-10-CM

## 2013-08-28 DIAGNOSIS — I5032 Chronic diastolic (congestive) heart failure: Secondary | ICD-10-CM

## 2013-08-28 DIAGNOSIS — L03116 Cellulitis of left lower limb: Secondary | ICD-10-CM

## 2013-08-28 DIAGNOSIS — I2510 Atherosclerotic heart disease of native coronary artery without angina pectoris: Secondary | ICD-10-CM

## 2013-08-28 DIAGNOSIS — L02419 Cutaneous abscess of limb, unspecified: Secondary | ICD-10-CM

## 2013-08-28 DIAGNOSIS — I10 Essential (primary) hypertension: Secondary | ICD-10-CM

## 2013-08-28 DIAGNOSIS — I4891 Unspecified atrial fibrillation: Secondary | ICD-10-CM

## 2013-08-28 LAB — CBC WITH DIFFERENTIAL/PLATELET
Basophils Absolute: 0 10*3/uL (ref 0.0–0.1)
Eosinophils Relative: 1.9 % (ref 0.0–5.0)
HCT: 41.1 % (ref 36.0–46.0)
Hemoglobin: 13.7 g/dL (ref 12.0–15.0)
Lymphocytes Relative: 22.3 % (ref 12.0–46.0)
Lymphs Abs: 1.7 10*3/uL (ref 0.7–4.0)
Monocytes Relative: 4.5 % (ref 3.0–12.0)
Neutro Abs: 5.3 10*3/uL (ref 1.4–7.7)
RBC: 4.73 Mil/uL (ref 3.87–5.11)
RDW: 15.9 % — ABNORMAL HIGH (ref 11.5–14.6)
WBC: 7.4 10*3/uL (ref 4.5–10.5)

## 2013-08-28 LAB — BASIC METABOLIC PANEL
Calcium: 9 mg/dL (ref 8.4–10.5)
Chloride: 102 mEq/L (ref 96–112)
Creatinine, Ser: 0.8 mg/dL (ref 0.4–1.2)
GFR: 69.26 mL/min (ref >60.00)
Potassium: 3.4 mEq/L — ABNORMAL LOW (ref 3.5–5.1)
Sodium: 137 mEq/L (ref 135–145)

## 2013-08-28 MED ORDER — POTASSIUM CHLORIDE CRYS ER 20 MEQ PO TBCR: 20.0000 meq | EXTENDED_RELEASE_TABLET | Freq: Two times a day (BID) | ORAL | Status: AC

## 2013-08-28 NOTE — Patient Instructions (Signed)
LAB WORK TODAY, BMET, CBC W/DIFF  NO CHANGES WITH YOUR MEDICATIONS AT TODAY'S VISIT  PLEASE FOLLOW UP WITH SCOTT WEAVER, [facility] 10/09/13 @ 10:30 SAME DAY DR. Percival Spanish IS IN THE OFFICE PER SCOTT.  YOUR GOAL INR IS 2-2.5  PLEASE HAVE [facility] FAX OVER ALL INR'S TO SCOTT WEAVER, PAC [phone]  MAKE SURE TO HAVE YOUR INR CHECKED ON A WEEKLY BASIS UNTIL YOUR FOLLOW UP 10/09/13

## 2013-08-28 NOTE — Telephone Encounter (Signed)
S/w Sam RN, nursing supervisor about lab results and to increase pt's K+ to 20 meq bid due to K+ 3.3 on lab. Results faxed over to [facility] today fax # [phone] per Inocente Salles, RN

## 2013-08-28 NOTE — Progress Notes (Signed)
[name] [name], [name] [name] [name] [name], [place]  [address] Phone: [phone] Fax:  [phone]  Date:  08/28/2013   ID:  [name] [name], DOB 1931/01/31, MRN [id number]  PCP:  [name] Argyle, MD  Cardiologist:  Dr. Minus Breeding     History of Present Illness: [name] [name] is a 79 y.o. female with a hx of HTN, HL, chronic thrombocytopenia, arthritis. She was admitted 09/2012 with a NSTEMI.  LHC 10/20/12: dLM 20%, pLAD 99%, mLAD 60% followed by 99%, oD1 99%, and pD2 30%, mild plaque disease in the CFX and RCA. PCI: Xience Xpedition DES to the proximal and mid LAD.  Echo 10/18/12 with normal LVF.  She was recently admitted 11/23-12/2 with lower extremity cellulitis complicated by atrial fibrillation with RVR and acute on chronic diastolic CHF. She was seen by cardiology.  CHADS2-VASc=6.  Therefore, Brilinta was changed to Plavix, ASA was stopped and she was placed on Coumadin with a goal INR of 2-2.5. Her rate was well controlled with metoprolol and diltiazem. She was managed by the internal medicine service for her cellulitis and treated with antibiotics.  Echo (08/10/13): Mild LVH, EF 60-65%.  She lives at [facility].  She typically gets around in a power chair.  She is fairly sedentary.  She denies chest pain, dyspnea, orthopnea, PND. LE edema is stable and is typically greater on the left. Her cellulitis seems to be improving. She denies palpitations. She denies syncope or near-syncope.  Recent Labs: 10/17/2012: HDL 53; LDL (calc) 100*  08/10/2013: Pro B Natriuretic peptide (BNP) 2308.0*  08/15/2013: TSH 2.129  08/18/2013: ALT 7; Creatinine 0.99; Hemoglobin 13.2; Potassium 3.7   Wt Readings from Last 3 Encounters:  08/28/13 205 lb (92.987 kg)  08/18/13 205 lb (92.987 kg)  07/19/13 204 lb 12.9 oz (92.9 kg)     Past Medical History  Diagnosis Date  . HTN (hypertension)   . DJD (degenerative joint disease)   . Osteopenia   . Dyslipidemia   . Venous ulcer   . Thrombocytopenia    . CAD (coronary artery disease)     a. NSTEMI 2/14 => LHC 10/20/12:  dLM 20%, pLAD 99%, mLAD 60% followed by 99%, oD1 99%, and pD2 30%, mild plaque disease in the CFX and RCA. PCI: Xience Xpedition DES to the proximal and mid LAD  . Chronic diastolic CHF (congestive heart failure)     Echo (08/10/13): Mild LVH, EF 60-65%.  . Atrial fibrillation     Current Outpatient Prescriptions  Medication Sig Dispense Refill  . acetaminophen (TYLENOL) 325 MG tablet Take 650 mg by mouth every 4 (four) hours as needed for pain. To stop 07/21/13      . atorvastatin (LIPITOR) 80 MG tablet Take 80 mg by mouth daily. RX is written for 6pm. Nursing Home administers at 8am      . Calcium Carbonate-Vitamin D (CALCARB 600/D PO) Take 2 tablets by mouth daily.      . capsicum oleoresin (TRIXAICIN) 0.025 % cream Apply 1 application topically 4 (four) times daily. To hip      . clopidogrel (PLAVIX) 75 MG tablet Take 1 tablet (75 mg total) by mouth daily with breakfast.  30 tablet  0  . collagenase (SANTYL) ointment Apply 1 application topically as needed (dressing changes).      Marland Kitchen diltiazem (CARDIZEM CD) 240 MG 24 hr capsule Take 1 capsule (240 mg total) by mouth daily.  30 capsule  0  . doxycycline (VIBRA-TABS) 100 MG tablet Take  1 tablet (100 mg total) by mouth every 12 (twelve) hours.  12 tablet  0  . enoxaparin (LOVENOX) 100 MG/ML injection Inject 0.95 mLs (95 mg total) into the skin every 12 (twelve) hours.  95 mL  10  . fluticasone (FLONASE) 50 MCG/ACT nasal spray Place 2 sprays into the nose daily.      . furosemide (LASIX) 40 MG tablet Take 1 tablet (40 mg total) by mouth 2 (two) times daily.  30 tablet  0  . gabapentin (NEURONTIN) 800 MG tablet Take 800 mg by mouth 4 (four) times daily.      . Lactobacillus Acid-Pectin (ACIDOPHILUS/CITRUS PECTIN) TABS Take 1 tablet by mouth daily.      . metoprolol (LOPRESSOR) 100 MG tablet Take 1 tablet (100 mg total) by mouth 2 (two) times daily.  60 tablet  0  . mineral  oil-hydrophilic petrolatum (AQUAPHOR) ointment Apply 1 application topically as needed for dry skin. knees      . morphine (MS CONTIN) 15 MG 12 hr tablet Take 3 tablets (45 mg total) by mouth every 12 (twelve) hours.  30 tablet  0  . mupirocin ointment (BACTROBAN) 2 % Apply 1 application topically as needed (with dressing changes).      . ondansetron (ZOFRAN) 4 MG tablet Take 4 mg by mouth every 8 (eight) hours as needed for nausea.      Marland Kitchen oxyCODONE (OXY IR/ROXICODONE) 5 MG immediate release tablet Take 1 tablet (5 mg total) by mouth every 6 (six) hours as needed for breakthrough pain (pain).  20 tablet  0  . Polyethyl Glycol-Propyl Glycol (SYSTANE OP) Place 1 drop into both eyes 2 (two) times daily as needed (dry eyes).      . potassium chloride SA (K-DUR,KLOR-CON) 20 MEQ tablet Take 1 tablet (20 mEq total) by mouth daily.  30 tablet  0  . saccharomyces boulardii (FLORASTOR) 250 MG capsule Take 250 mg by mouth 3 (three) times daily.       Marland Kitchen warfarin (COUMADIN) 5 MG tablet Take 1 tablet (5 mg total) by mouth daily.  10 tablet  0   No current facility-administered medications for this visit.    Allergies:   Review of patient's allergies indicates no known allergies.   Social History:  The patient  reports that she has never smoked. She has never used smokeless tobacco. She reports that she does not drink alcohol or use illicit drugs.   Family History:  The patient's family history includes CAD (age of onset: 77) in her father.   ROS:  Please see the history of present illness.      All other systems reviewed and negative.   PHYSICAL EXAM: VS:  BP 140/70  Pulse 83  Ht _0  (1.626 m)  Wt 205 lb (92.987 kg)  BMI 35.17 kg/m2 Chronically ill-appearing female sitting in wheelchair in no acute distress HEENT: normal Neck: no JVD At 90 Cardiac:  normal S1, S2; irregularly irregular rhythm; no murmur Lungs:  clear to auscultation bilaterally, no wheezing, rhonchi or rales Abd: soft,  nontender, no hepatomegaly Ext: trace bilateral LE edema; bilateral compression stockings in place Skin: warm and dry Neuro:  CNs 2-12 intact, no focal abnormalities noted  EKG:  Atrial fibrillation, HR 83, normal axis, no ST changes     ASSESSMENT AND PLAN:  1. Atrial Fibrillation: Rate is controlled. She will remain on Coumadin with a goal INR of 2-2.5. At this point, I believe that a strategy of rate control and  anticoagulation is in order. She is fairly asymptomatic. We will continue to monitor her INRs through her nursing home. She will be brought back in follow up in 4-6 weeks. If her INRs have been therapeutic and she seems to be symptomatic, we can certainly consider cardioversion at that time. 2. CAD: Stable. No angina. She is now on Plavix in addition to Coumadin. Continue statin. Check follow up CBC today given antiplatelet therapy plus Coumadin. 3. Chronic Diastolic CHF: Volume stable. Check follow up basic metabolic panel today. 4. Hypertension: Controlled. 5. Hyperlipidemia: Continue statin. 6. Cellulitis: Follow up with primary care. 7. Disposition: Follow up with me or Dr. Percival Spanish in 4-6 weeks.  Signed, Richardson Dopp, PA-C  08/28/2013 10:59 AM

## 2013-10-09 ENCOUNTER — Encounter: Payer: Self-pay | Admitting: Physician Assistant

## 2013-10-09 ENCOUNTER — Ambulatory Visit (INDEPENDENT_AMBULATORY_CARE_PROVIDER_SITE_OTHER): Payer: Medicare Other | Admitting: Physician Assistant

## 2013-10-09 VITALS — BP 142/66 | HR 91 | Ht 64.0 in | Wt 200.0 lb

## 2013-10-09 DIAGNOSIS — I482 Chronic atrial fibrillation, unspecified: Secondary | ICD-10-CM | POA: Insufficient documentation

## 2013-10-09 DIAGNOSIS — I2510 Atherosclerotic heart disease of native coronary artery without angina pectoris: Secondary | ICD-10-CM | POA: Insufficient documentation

## 2013-10-09 DIAGNOSIS — I10 Essential (primary) hypertension: Secondary | ICD-10-CM

## 2013-10-09 DIAGNOSIS — I5032 Chronic diastolic (congestive) heart failure: Secondary | ICD-10-CM

## 2013-10-09 DIAGNOSIS — E785 Hyperlipidemia, unspecified: Secondary | ICD-10-CM

## 2013-10-09 DIAGNOSIS — I4891 Unspecified atrial fibrillation: Secondary | ICD-10-CM

## 2013-10-09 LAB — BASIC METABOLIC PANEL
BUN: 18 mg/dL (ref 6–23)
CO2: 32 mEq/L (ref 19–32)
Calcium: 9 mg/dL (ref 8.4–10.5)
Chloride: 102 mEq/L (ref 96–112)
Creatinine, Ser: 0.9 mg/dL (ref 0.4–1.2)
GFR: 63.13 mL/min (ref >60.00)
Glucose, Bld: 76 mg/dL (ref 70–99)
Potassium: 3.5 mEq/L (ref 3.5–5.1)
SODIUM: 141 meq/L (ref 135–145)

## 2013-10-09 NOTE — Progress Notes (Signed)
[place], [name] [name], [place]  [address] Phone: [phone] Fax:  [phone]  Date:  10/09/2013   ID:  [name], DOB December 27, 1930, MRN [id number]  PCP:  Mathews Argyle, MD  Cardiologist:  Dr. Minus Breeding     History of Present Illness: [name] [name] is a 80 y.o. female with a hx of HTN, HL, chronic thrombocytopenia, arthritis. She was admitted 09/2012 with a NSTEMI.  LHC 10/20/12: dLM 20%, pLAD 99%, mLAD 60% followed by 99%, oD1 99%, and pD2 30%, mild plaque disease in the CFX and RCA. PCI: Xience Xpedition DES to the proximal and mid LAD.  Echo 10/18/12 with normal LVF.  She was admitted 07/2013 with LE cellulitis complicated by AFib with RVR and a/c diastolic CHF. She was seen by cardiology.  CHADS2-VASc=6.  Therefore, Brilinta was changed to Plavix, ASA was stopped and she was placed on Coumadin with a goal INR of 2-2.5. Her rate was well controlled with metoprolol and diltiazem.  Echo (08/10/13): Mild LVH, EF 60-65%.  I saw her in followup 08/28/13. Heart rate was well controlled. She appeared to be asymptomatic with atrial fibrillation.  She remains fairly sedentary.  She denies dyspnea, chest pain, syncope.  She denies orthopnea, PND. LE edema is improved.    Recent Labs: 10/17/2012: HDL Cholesterol 53; LDL (calc) 100*  08/10/2013: Pro B Natriuretic peptide (BNP) 2308.0*  08/15/2013: TSH 2.129  08/18/2013: ALT 7  08/28/2013: Creatinine 0.8; Hemoglobin 13.7; Potassium 3.4*   Wt Readings from Last 3 Encounters:  10/09/13 200 lb (90.719 kg)  08/28/13 205 lb (92.987 kg)  08/18/13 205 lb (92.987 kg)     Past Medical History  Diagnosis Date  . HTN (hypertension)   . DJD (degenerative joint disease)   . Osteopenia   . Dyslipidemia   . Venous ulcer   . Thrombocytopenia   . CAD (coronary artery disease)     a. NSTEMI 2/14 => LHC 10/20/12:  dLM 20%, pLAD 99%, mLAD 60% followed by 99%, oD1 99%, and pD2 30%, mild plaque disease in the CFX and RCA. PCI:  Xience Xpedition DES to the proximal and mid LAD  . Chronic diastolic CHF (congestive heart failure)     Echo (08/10/13): Mild LVH, EF 60-65%.  . Atrial fibrillation     Current Outpatient Prescriptions  Medication Sig Dispense Refill  . atorvastatin (LIPITOR) 80 MG tablet Take 80 mg by mouth daily. RX is written for 6pm. Nursing Home administers at 8am      . Calcium Carbonate-Vitamin D (CALCARB 600/D PO) Take 2 tablets by mouth daily.      . clopidogrel (PLAVIX) 75 MG tablet Take 1 tablet (75 mg total) by mouth daily with breakfast.  30 tablet  0  . fluticasone (FLONASE) 50 MCG/ACT nasal spray Place 2 sprays into the nose daily.      Marland Kitchen gabapentin (NEURONTIN) 800 MG tablet Take 800 mg by mouth 4 (four) times daily.      . ondansetron (ZOFRAN) 4 MG tablet Take 4 mg by mouth every 8 (eight) hours as needed for nausea.      Marland Kitchen oxyCODONE (OXY IR/ROXICODONE) 5 MG immediate release tablet Take 1 tablet (5 mg total) by mouth every 6 (six) hours as needed for breakthrough pain (pain).  20 tablet  0  . Polyethyl Glycol-Propyl Glycol (SYSTANE OP) Place 1 drop into both eyes 2 (two) times daily as needed (dry eyes).      . warfarin (COUMADIN) 4 MG  tablet Take 4 mg by mouth daily.      Marland Kitchen acetaminophen (TYLENOL) 325 MG tablet Take 650 mg by mouth every 4 (four) hours as needed for pain. To stop 07/21/13      . capsicum oleoresin (TRIXAICIN) 0.025 % cream Apply 1 application topically 4 (four) times daily. To hip      . collagenase (SANTYL) ointment Apply 1 application topically as needed (dressing changes).      Marland Kitchen diltiazem (CARDIZEM CD) 240 MG 24 hr capsule Take 1 capsule (240 mg total) by mouth daily.  30 capsule  0  . doxycycline (VIBRA-TABS) 100 MG tablet Take 1 tablet (100 mg total) by mouth every 12 (twelve) hours.  12 tablet  0  . enoxaparin (LOVENOX) 100 MG/ML injection Inject 0.95 mLs (95 mg total) into the skin every 12 (twelve) hours.  95 mL  10  . furosemide (LASIX) 40 MG tablet Take 1 tablet  (40 mg total) by mouth 2 (two) times daily.  30 tablet  0  . Lactobacillus Acid-Pectin (ACIDOPHILUS/CITRUS PECTIN) TABS Take 1 tablet by mouth daily.      . metoprolol (LOPRESSOR) 100 MG tablet Take 1 tablet (100 mg total) by mouth 2 (two) times daily.  60 tablet  0  . mineral oil-hydrophilic petrolatum (AQUAPHOR) ointment Apply 1 application topically as needed for dry skin. knees      . morphine (MS CONTIN) 15 MG 12 hr tablet Take 3 tablets (45 mg total) by mouth every 12 (twelve) hours.  30 tablet  0  . mupirocin ointment (BACTROBAN) 2 % Apply 1 application topically as needed (with dressing changes).      . potassium chloride SA (K-DUR,KLOR-CON) 20 MEQ tablet Take 1 tablet (20 mEq total) by mouth 2 (two) times daily.      Marland Kitchen saccharomyces boulardii (FLORASTOR) 250 MG capsule Take 250 mg by mouth 3 (three) times daily.        No current facility-administered medications for this visit.    Allergies:   Review of patient's allergies indicates no known allergies.   Social History:  The patient  reports that she has never smoked. She has never used smokeless tobacco. She reports that she does not drink alcohol or use illicit drugs.   Family History:  The patient's family history includes CAD (age of onset: 38) in her father.   ROS:  Please see the history of present illness.  No bleeding problems.    All other systems reviewed and negative.   PHYSICAL EXAM: VS:  BP 142/66  Pulse 91  Ht _0  (1.626 m)  Wt 200 lb (90.719 kg)  BMI 34.31 kg/m2 Chronically ill-appearing female sitting in wheelchair in no acute distress HEENT: normal Neck: I cannot assess JVD at 89 Cardiac:  normal S1, S2; irregularly irregular rhythm; no murmur Lungs:  clear to auscultation bilaterally, no wheezing, rhonchi or rales Abd: soft, nontender, no hepatomegaly Ext: trace -1+ brawny bilateral LE edema (L>R) Skin: warm and dry Neuro:  CNs 2-12 intact, no focal abnormalities noted  EKG:  AFib, HR 91      ASSESSMENT AND PLAN:  1. Atrial Fibrillation:  Rate is controlled. She will remain on Coumadin with a goal INR of 2-2.5 which is followed at the nursing home.  Reviewed with Dr. Minus Breeding.  We will continue to pursue a rate control strategy.   2. CAD: Stable.  No angina. She is now on Plavix in addition to Coumadin. Continue statin.  3. Chronic Diastolic  CHF:  Volume appears stable.  She needs a follow up BMET given recent hx of hypokalemia.  4. Hypertension:  Controlled. 5. Hyperlipidemia:  Continue statin. 6. Disposition: Follow up with Dr. Minus Breeding in 3 mos.   Signed, Richardson Dopp, PA-C  10/09/2013 11:10 AM

## 2013-10-09 NOTE — Patient Instructions (Signed)
Your physician recommends that you continue on your current medications as directed. Please refer to the Current Medication list given to you today.  LAB WORK TODAY; BMET  Your physician wants you to follow-up in: 3 MONTHS WITH DR. Orme. You will receive a reminder letter in the mail two months in advance. If you don't receive a letter, please call our office to schedule the follow-up appointment.

## 2013-10-12 ENCOUNTER — Telehealth: Payer: Self-pay | Admitting: *Deleted

## 2013-10-12 NOTE — Telephone Encounter (Signed)
s/w RN at [facility] about normal lab results, will fax today to [phone].

## 2013-11-02 ENCOUNTER — Telehealth: Payer: Self-pay | Admitting: *Deleted

## 2013-11-02 NOTE — Telephone Encounter (Signed)
[name] [name] from [facility] called to report pt's Plt count today was 25.  Wants to know what to do with the Coumadin.  Per Dr. Marin Olp they should contact physician that is regulating pt's coumadin.  Called [name] [name] back and told her the above and she said she had just gotten off the phone with Dr. Felipa Eth and he adjusted the coumadin.

## 2014-08-26 ENCOUNTER — Encounter (HOSPITAL_COMMUNITY): Payer: Self-pay | Admitting: Cardiovascular Disease

## 2014-08-30 ENCOUNTER — Other Ambulatory Visit (HOSPITAL_COMMUNITY): Payer: Self-pay | Admitting: Geriatric Medicine

## 2014-08-30 ENCOUNTER — Ambulatory Visit (HOSPITAL_COMMUNITY)
Admission: RE | Admit: 2014-08-30 | Discharge: 2014-08-30 | Disposition: A | Discharge status: Home / Self Care | Payer: Medicare Other | Source: Ambulatory Visit | Attending: Geriatric Medicine | Admitting: Geriatric Medicine

## 2014-08-30 DIAGNOSIS — M25579 Pain in unspecified ankle and joints of unspecified foot: Secondary | ICD-10-CM | POA: Insufficient documentation

## 2014-08-30 DIAGNOSIS — M79609 Pain in unspecified limb: Secondary | ICD-10-CM

## 2014-08-30 NOTE — Progress Notes (Signed)
VASCULAR LAB PRELIMINARY  ARTERIAL  ABI completed:    RIGHT    LEFT    PRESSURE WAVEFORM  PRESSURE WAVEFORM  BRACHIAL 142 T BRACHIAL 143 T  DP 113 B DP 111 B  AT   AT    PT 125 B PT 144 B  PER   PER    GREAT TOE  NA GREAT TOE  NA    RIGHT LEFT  ABI 0.87 >1.0     Wasim Hurlbut, RVT 08/30/2014, 3:21 PM

## 2015-03-14 ENCOUNTER — Other Ambulatory Visit: Payer: Self-pay

## 2015-12-14 ENCOUNTER — Encounter: Payer: Self-pay | Admitting: Podiatry

## 2015-12-14 ENCOUNTER — Ambulatory Visit (INDEPENDENT_AMBULATORY_CARE_PROVIDER_SITE_OTHER): Payer: Medicare Other | Admitting: Podiatry

## 2015-12-14 VITALS — BP 142/82 | HR 82 | Resp 12

## 2015-12-14 DIAGNOSIS — S90121A Contusion of right lesser toe(s) without damage to nail, initial encounter: Secondary | ICD-10-CM | POA: Diagnosis not present

## 2015-12-14 DIAGNOSIS — S90122A Contusion of left lesser toe(s) without damage to nail, initial encounter: Secondary | ICD-10-CM

## 2015-12-14 NOTE — Progress Notes (Signed)
Subjective:    Patient ID: [name] [name], female    DOB: [birth year], 82 y.o.   MRN: [id number]  HPI    This patient presents today with patient's CNA present in treatment room. Patient is concerned about a discolored thick left hallux toenail for the past month that has remained discolored infected has had no active treatment. Patient does not recall any direct injury to the toe. Also, patient is concerned about some thickened toenails on the right and left feet Patient is undergoing compressive dressings on the lower extremities, bilaterally , suggesting that she may have had some open skin lesions that are being treated on a daily basis.     Review of Systems  Respiratory: Positive for shortness of breath.   Musculoskeletal: Positive for gait problem.  Skin: Positive for color change.       Objective:   Physical Exam  Patient appears orientated 3 and can respond to questioning Patient transfers from wheelchair to treatment chair  Vascular: DP pulses nonpalpable bilaterally PT pulses nonpalpable bilaterally Capillary reflex immediate bilaterally  Neurological: Sensation to 10 g monofilament wire intact 4/5 bilaterally Vibratory sensation nonreactive bilaterally Ankle reflexes trace reactive bilaterally  Dermatological: Compression dressings lower extremities to midfoot left bilaterally that were not removed as their underlying dressings The left hallux nail is hypertrophic with hard dry wax skin surrounding the nailbed and medial posterior and lateral nail grooves. There is no active drainage, malodor, erythema noted Toenails to left and 2, 4 right hypertrophic with dried blood beneath them  Musculoskeletal: Patient is only able to transfer from wheelchair to treatment chair Limited range of motion ankle, subtalar, midtarsal joints bilaterally      Assessment & Plan:   Assessment: Compression dressings were not removed making it more difficult to palpate  posterior tibial pulses bilaterally Gait disturbance Hematoma left hallux Mycotic toenails  2 left and 2, 4 right  Plan: Debride I&D  eschar hematoma left hallux unroofing some residual moist blood trapped beneath the hematoma/eschar An antibiotic dressing was applied and left hallux with instructions to continue applying antibiotic ointment and dressing left hallux nail daily until healed  Debridement toenails to left and 2 and 4 right mechanically electronically without any bleeding  Reappoint at patient's request

## 2015-12-14 NOTE — Patient Instructions (Addendum)
Today the foot exam revealed compression dressings on the extremities bilaterally Nonpalpable pedal pulses bilaterally Diminished sensation bilaterally Well-organized eschar left hallux consistent with hematoma Occasional bleeding beneath lesser toenails second left and 2, 4 right The eschar/hematoma was debrided and the left hallux revealing some residual bleeding beneath the eschar An antibiotic dressing was applied to the left hallux Apply topical antibiotic ointment such as triple antibiotic ointment, or Polysporin or Neosporin and cover with gauze or Band-Aid daily to the left hallux until healed  Return as needed  [place]

## 2016-08-03 ENCOUNTER — Other Ambulatory Visit: Payer: Self-pay | Admitting: Family

## 2016-08-03 ENCOUNTER — Ambulatory Visit: Payer: Medicare Other

## 2016-08-03 ENCOUNTER — Ambulatory Visit (HOSPITAL_BASED_OUTPATIENT_CLINIC_OR_DEPARTMENT_OTHER): Payer: Medicare Other | Admitting: Family

## 2016-08-03 ENCOUNTER — Other Ambulatory Visit (HOSPITAL_BASED_OUTPATIENT_CLINIC_OR_DEPARTMENT_OTHER): Payer: Medicare Other

## 2016-08-03 VITALS — BP 116/67 | HR 82 | Temp 97.6°F | Resp 20 | Wt 223.1 lb

## 2016-08-03 DIAGNOSIS — D696 Thrombocytopenia, unspecified: Secondary | ICD-10-CM

## 2016-08-03 DIAGNOSIS — D693 Immune thrombocytopenic purpura: Secondary | ICD-10-CM

## 2016-08-03 LAB — CMP (CANCER CENTER ONLY)
ALT(SGPT): 12 U/L (ref 10–47)
AST: 18 U/L (ref 11–38)
Albumin: 3.2 g/dL — ABNORMAL LOW (ref 3.3–5.5)
Alkaline Phosphatase: 90 U/L — ABNORMAL HIGH (ref 26–84)
BILIRUBIN TOTAL: 1.8 mg/dL — AB (ref 0.20–1.60)
BUN: 25 mg/dL — AB (ref 7–22)
CO2: 31 mEq/L (ref 18–33)
Calcium: 9.1 mg/dL (ref 8.0–10.3)
Chloride: 97 mEq/L — ABNORMAL LOW (ref 98–108)
Creat: 1.4 mg/dl — ABNORMAL HIGH (ref 0.6–1.2)
GLUCOSE: 129 mg/dL — AB (ref 73–118)
Potassium: 3.7 mEq/L (ref 3.3–4.7)
Sodium: 146 mEq/L — ABNORMAL HIGH (ref 128–145)
Total Protein: 6.9 g/dL (ref 6.4–8.1)

## 2016-08-03 LAB — CBC WITH DIFFERENTIAL (CANCER CENTER ONLY)
BASO#: 0 10*3/uL (ref 0.0–0.2)
BASO%: 0.3 % (ref 0.0–2.0)
EOS%: 1.9 % (ref 0.0–7.0)
Eosinophils Absolute: 0.1 10*3/uL (ref 0.0–0.5)
HEMATOCRIT: 40.3 % (ref 34.8–46.6)
HGB: 12.6 g/dL (ref 11.6–15.9)
LYMPH#: 0.9 10*3/uL (ref 0.9–3.3)
LYMPH%: 11.8 % — ABNORMAL LOW (ref 14.0–48.0)
MCH: 31.3 pg (ref 26.0–34.0)
MCHC: 31.3 g/dL — ABNORMAL LOW (ref 32.0–36.0)
MCV: 100 fL (ref 81–101)
MONO#: 0.2 10*3/uL (ref 0.1–0.9)
MONO%: 3.1 % (ref 0.0–13.0)
NEUT#: 6 10*3/uL (ref 1.5–6.5)
NEUT%: 82.9 % — AB (ref 39.6–80.0)
RBC: 4.02 10*6/uL (ref 3.70–5.32)
RDW: 18.9 % — AB (ref 11.1–15.7)
WBC: 7.2 10*3/uL (ref 3.9–10.0)

## 2016-08-03 LAB — TECHNOLOGIST REVIEW CHCC SATELLITE: Tech Review: 6

## 2016-08-03 LAB — CHCC SATELLITE - SMEAR

## 2016-08-03 MED ORDER — FAMCICLOVIR 250 MG PO TABS
250.0000 mg | ORAL_TABLET | Freq: Every day | ORAL | 1 refills | Status: AC
Start: 2016-08-03 — End: ?

## 2016-08-03 MED ORDER — PANTOPRAZOLE SODIUM 40 MG PO TBEC: 40.0000 mg | DELAYED_RELEASE_TABLET | Freq: Every day | ORAL | 1 refills | Status: DC

## 2016-08-03 MED ORDER — PREDNISONE 20 MG PO TABS: ORAL_TABLET | ORAL | 0 refills | Status: DC

## 2016-08-03 NOTE — Progress Notes (Signed)
Hematology and Oncology Follow Up Visit  [name] [name] [id number] 04-25-1931 82 y.o. 08/03/2016   Principle Diagnosis:  ITP  Current Therapy:   Observation    Interim History:  [name] [name] is here today to re-establish care with out office. We have not seen her in over 5 years. Dr. Marin Olp had previously seen her for ITP. Her platelet count at that time was in the 80's and stable.  She is currently living in a skilled nursing facility. She is here with the facility's transporter and is a bit of a poor historian.  According to note from SNF the patient is no longer on aspirin or coumadin. She does take Plavix daily.  Her platelet count on labs earlier this week was 13 and is now 11. She has had no episodes of bleeding but has noticed that she is bruising easily.  The patient denies being diabetic.  No fever, chills, n/v, cough, rash, dizziness, SOB, chest pain, palpitations, abdominal pain or changes in bowel or bladder habits.  Patient has PVD with severe venous insufficiency and swelling in the lower extremities. She has +3 pitting edema in both legs. No numbness or tingling in her extremities. She states that she has a good appetite and is staying well hydrated. Her weight is stable.   Medications:    Medication List       Accurate as of 08/03/16  3:53 PM. Always use your most recent med list.          ACIDOPHILUS/CITRUS PECTIN Tabs Take 1 tablet by mouth daily.   atorvastatin 80 MG tablet Commonly known as:  LIPITOR Take 80 mg by mouth daily. RX is written for 6pm. Nursing Home administers at [place] 600/D PO Take 2 tablets by mouth daily.   clopidogrel 75 MG tablet Commonly known as:  PLAVIX Take 1 tablet (75 mg total) by mouth daily with breakfast.   diltiazem 240 MG 24 hr capsule Commonly known as:  CARDIZEM CD Take 1 capsule (240 mg total) by mouth daily.   fluticasone 50 MCG/ACT nasal spray Commonly known as:  FLONASE Place 2 sprays into the nose  daily.   furosemide 40 MG tablet Commonly known as:  LASIX Take 1 tablet (40 mg total) by mouth 2 (two) times daily.   gabapentin 300 MG capsule Commonly known as:  NEURONTIN Take 800 mg by mouth 3 (three) times daily.   metoprolol 100 MG tablet Commonly known as:  LOPRESSOR Take 1 tablet (100 mg total) by mouth 2 (two) times daily.   mineral oil-hydrophilic petrolatum ointment Apply 1 application topically as needed for dry skin. knees   morphine 15 MG 12 hr tablet Commonly known as:  MS CONTIN Take 3 tablets (45 mg total) by mouth every 12 (twelve) hours.   ondansetron 4 MG tablet Commonly known as:  ZOFRAN Take 4 mg by mouth every 8 (eight) hours as needed for nausea.   oxyCODONE 5 MG immediate release tablet Commonly known as:  Oxy IR/ROXICODONE Take 1 tablet (5 mg total) by mouth every 6 (six) hours as needed for breakthrough pain (pain).   potassium chloride SA 20 MEQ tablet Commonly known as:  K-DUR,KLOR-CON Take 1 tablet (20 mEq total) by mouth 2 (two) times daily.   SYSTANE OP Place 1 drop into both eyes 2 (two) times daily as needed (dry eyes).   torsemide 20 MG tablet Commonly known as:  DEMADEX Take 80 mg by mouth daily.  Allergies: No Known Allergies  Past Medical History, Surgical history, Social history, and Family History were reviewed and updated.  Review of Systems: All other 10 point review of systems is negative.   Physical Exam:  weight is 223 lb 1.9 oz (101.2 kg). Her oral temperature is 97.6 F (36.4 C). Her blood pressure is 116/67 and her pulse is 82. Her respiration is 20.   Wt Readings from Last 3 Encounters:  08/03/16 223 lb 1.9 oz (101.2 kg)  10/09/13 200 lb (90.7 kg)  08/28/13 205 lb (93 kg)    Ocular: Sclerae unicteric, pupils equal, round and reactive to light Ear-nose-throat: Oropharynx clear, dentition fair Lymphatic: No cervical supraclavicular or axillary adenopathy Lungs no rales or rhonchi, good excursion  bilaterally Heart regular rate and rhythm, no murmur appreciated Abd soft, nontender, positive bowel sounds, no liver or spleen tip palpated on exam MSK no focal spinal tenderness, no joint edema Neuro: non-focal, well-oriented, appropriate affect Breasts: Deferred  Lab Results  Component Value Date   WBC 7.2 08/03/2016   HGB 12.6 08/03/2016   HCT 40.3 08/03/2016   MCV 100 08/03/2016   PLT 11 Platelet count confirmed by slide estimate (L) 08/03/2016   No results found for: FERRITIN, IRON, TIBC, UIBC, IRONPCTSAT Lab Results  Component Value Date   RBC 4.02 08/03/2016   No results found for: KPAFRELGTCHN, LAMBDASER, KAPLAMBRATIO No results found for: IGGSERUM, IGA, IGMSERUM No results found for: TOTALPROTELP, ALBUMINELP, A1GS, A2GS, BETS, BETA2SER, GAMS, MSPIKE, SPEI   Chemistry      Component Value Date/Time   NA 146 (H) 08/03/2016 1439   K 3.7 08/03/2016 1439   CL 97 (L) 08/03/2016 1439   CO2 31 08/03/2016 1439   BUN 25 (H) 08/03/2016 1439   CREATININE 1.4 (H) 08/03/2016 1439      Component Value Date/Time   CALCIUM 9.1 08/03/2016 1439   ALKPHOS 90 (H) 08/03/2016 1439   AST 18 08/03/2016 1439   ALT 12 08/03/2016 1439   BILITOT 1.80 (H) 08/03/2016 1439     Impression and Plan: [name] [name] is a very pleasant 82 yo female with history of ITP. When she was previously seen in our office her platelet count was stable in the 80's. She now has a platelet count of 11 and has noticed that she is bruising easily. She has had no bleeding.  We will have her start Prednisone 60 mg PO daily as well as protonix 40 mg PO daily and Famvir PO daily.  We will plan to see her back in 1 weeks for repeat lab work and follow-up.  Her living facility will contact us with any questions or concerns. We can certainly see her sooner if need be.   Eliezer Bottom, NP 11/17/20173:53 PM

## 2016-08-10 ENCOUNTER — Ambulatory Visit: Payer: Medicare Other | Admitting: Family

## 2016-08-10 ENCOUNTER — Other Ambulatory Visit: Payer: Medicare Other

## 2016-08-10 ENCOUNTER — Encounter (HOSPITAL_COMMUNITY): Payer: Self-pay | Admitting: Emergency Medicine

## 2016-08-10 ENCOUNTER — Emergency Department (HOSPITAL_COMMUNITY)
Admission: EM | Admit: 2016-08-10 | Discharge: 2016-08-10 | Disposition: A | Discharge status: Home / Self Care | Payer: Medicare Other | Attending: Emergency Medicine | Admitting: Emergency Medicine

## 2016-08-10 DIAGNOSIS — I110 Hypertensive heart disease with heart failure: Secondary | ICD-10-CM | POA: Diagnosis not present

## 2016-08-10 DIAGNOSIS — Y939 Activity, unspecified: Secondary | ICD-10-CM | POA: Diagnosis not present

## 2016-08-10 DIAGNOSIS — I2510 Atherosclerotic heart disease of native coronary artery without angina pectoris: Secondary | ICD-10-CM | POA: Insufficient documentation

## 2016-08-10 DIAGNOSIS — Z79899 Other long term (current) drug therapy: Secondary | ICD-10-CM | POA: Insufficient documentation

## 2016-08-10 DIAGNOSIS — X58XXXA Exposure to other specified factors, initial encounter: Secondary | ICD-10-CM | POA: Insufficient documentation

## 2016-08-10 DIAGNOSIS — Z9660 Presence of unspecified orthopedic joint implant: Secondary | ICD-10-CM | POA: Insufficient documentation

## 2016-08-10 DIAGNOSIS — S81811A Laceration without foreign body, right lower leg, initial encounter: Secondary | ICD-10-CM | POA: Insufficient documentation

## 2016-08-10 DIAGNOSIS — S81812A Laceration without foreign body, left lower leg, initial encounter: Secondary | ICD-10-CM | POA: Diagnosis not present

## 2016-08-10 DIAGNOSIS — Y929 Unspecified place or not applicable: Secondary | ICD-10-CM | POA: Insufficient documentation

## 2016-08-10 DIAGNOSIS — I5032 Chronic diastolic (congestive) heart failure: Secondary | ICD-10-CM | POA: Insufficient documentation

## 2016-08-10 DIAGNOSIS — Y999 Unspecified external cause status: Secondary | ICD-10-CM | POA: Insufficient documentation

## 2016-08-10 MED ORDER — LIDOCAINE HCL (PF) 1 % IJ SOLN
10.0000 mL | Freq: Once | INTRAMUSCULAR | Status: AC
  Administered 2016-08-10: 10 mL
  Filled 2016-08-10: qty 30

## 2016-08-10 NOTE — ED Notes (Signed)
Bed: WA06 Expected date:  Expected time:  Means of arrival:  Comments: EMS

## 2016-08-10 NOTE — ED Triage Notes (Signed)
Per EMS pt from [facility], uses electric wheelchair , ran to a lift, co bilateral shins skin tears.  dressing placed by staff at the facility. Bleeding controled per EMS. Pt  Takes Plavix. Alert and oriented x 4,

## 2016-08-10 NOTE — Discharge Instructions (Signed)
Clean the wounds daily with soap and water.  Apply a light bandage to the wounds after cleaning.  Have the sutures removed in 12-14 days.

## 2016-08-10 NOTE — ED Notes (Signed)
Called [facility] for transport. Also called [facility] for report, left [facility] mail to [facility] to call back for full report.

## 2016-08-11 NOTE — ED Provider Notes (Signed)
History              Chief Complaint    Chief Complaint  Patient presents with  . skin tear ( bilateral shins)    HPI [name] [name] is a 82 y.o. female.   She was maneuvering in her electric wheelchair to get into a weighing device, when she accidentally ran into something which caused lacerations to her lower legs bilaterally. There were no other injuries. She denies recent illnesses. There are no other known modifying factors.  HPI      Past Medical History:  Diagnosis Date  . Atrial fibrillation ([place])   . CAD (coronary artery disease)    a. NSTEMI 2/14 => LHC 10/20/12:  dLM 20%, pLAD 99%, mLAD 60% followed by 99%, oD1 99%, and pD2 30%, mild plaque disease in the CFX and RCA. PCI: Xience Xpedition DES to the proximal and mid LAD  . Chronic diastolic CHF (congestive heart failure) ([place])    Echo (08/10/13): Mild LVH, EF 60-65%.  . DJD (degenerative joint disease)   . Dyslipidemia   . HTN (hypertension)   . Osteopenia   . Thrombocytopenia ([place])   . Venous ulcer         Patient Active Problem List   Diagnosis Date Noted  . Coronary atherosclerosis of native coronary artery 10/09/2013  . Atrial fibrillation ([place]) 10/09/2013  . Chronic diastolic heart failure ([place]) 10/09/2013  . Leukocytosis, unspecified 08/11/2013  . Sepsis ([place]) 08/10/2013  . Cellulitis of leg 08/10/2013  . Atrial fibrillation with RVR ([place]) 08/10/2013  . Cellulitis of left lower extremity 07/16/2013  . Chronic narcotic dependence ([place]) 07/16/2013  . Varicose veins of lower extremities with other complications 44/11/4740  . Edema 05/14/2013  . Bacteremia 12/26/2012  . Severe sepsis ([place]) 12/26/2012  . Cholecystitis, acute 12/20/2012  . Hypokalemia 12/20/2012  . CAD (coronary artery disease) of artery bypass graft 12/18/2012  . Unstable angina ([place]) 10/18/2012  . Thrombocytopenia ([place]) 10/18/2012  . Chest pain 10/17/2012  . HTN (hypertension) 10/17/2012  . Dyslipidemia  10/17/2012  . Chronic back pain 10/17/2012  . Precordial pain 10/17/2012         Past Surgical History:  Procedure Laterality Date  . BACK SURGERY     Lumbar disc  . EYE SURGERY     Blepharoplasty  . HIP SURGERY     R THR  . INSERTION / PLACEMENT / REVISION NEUROSTIMULATOR    . JOINT REPLACEMENT    . LEFT HEART CATHETERIZATION WITH CORONARY ANGIOGRAM N/A 10/20/2012   Procedure: LEFT HEART CATHETERIZATION WITH CORONARY ANGIOGRAM;  Surgeon: Burnell Blanks, MD;  Location: [name] CATH LAB;  Service: Cardiovascular;  Laterality: N/A;       OB History    No data available       Home Medications                      Prior to Admission medications   Medication Sig Start Date End Date Taking? Authorizing Provider  atorvastatin (LIPITOR) 80 MG tablet Take 80 mg by mouth daily. RX is written for 6pm. Nursing Home administers at [place] Provider, MD  Calcium Carbonate-Vitamin D (CALCARB 600/D PO) Take 2 tablets by mouth daily.    Historical Provider, MD  clopidogrel (PLAVIX) 75 MG tablet Take 1 tablet (75 mg total) by mouth daily with breakfast. Patient not taking: Reported on 08/03/2016 08/18/13   Velvet Bathe, MD  diltiazem (CARDIZEM CD) 240 MG  24 hr capsule Take 1 capsule (240 mg total) by mouth daily. 08/18/13   Velvet Bathe, MD  famciclovir (FAMVIR) 250 MG tablet Take 1 tablet (250 mg total) by mouth daily. 08/03/16   Eliezer Bottom, NP  fluticasone [facility]) 50 MCG/ACT nasal spray Place 2 sprays into the nose daily.    Historical Provider, MD  furosemide (LASIX) 40 MG tablet Take 1 tablet (40 mg total) by mouth 2 (two) times daily. Patient not taking: Reported on 08/03/2016 08/18/13   Velvet Bathe, MD  gabapentin (NEURONTIN) 300 MG capsule Take 800 mg by mouth 3 (three) times daily.     Historical Provider, MD  Lactobacillus Acid-Pectin (ACIDOPHILUS/CITRUS PECTIN) TABS Take 1 tablet by mouth daily.    Historical Provider,  MD  metoprolol (LOPRESSOR) 100 MG tablet Take 1 tablet (100 mg total) by mouth 2 (two) times daily. 08/18/13   Velvet Bathe, MD  mineral oil-hydrophilic petrolatum (AQUAPHOR) ointment Apply 1 application topically as needed for dry skin. knees    Historical Provider, MD  morphine (MS CONTIN) 15 MG 12 hr tablet Take 3 tablets (45 mg total) by mouth every 12 (twelve) hours. 08/18/13   Velvet Bathe, MD  ondansetron (ZOFRAN) 4 MG tablet Take 4 mg by mouth every 8 (eight) hours as needed for nausea.    Historical Provider, MD  oxyCODONE (OXY IR/ROXICODONE) 5 MG immediate release tablet Take 1 tablet (5 mg total) by mouth every 6 (six) hours as needed for breakthrough pain (pain). Patient taking differently: Take 5 mg by mouth every 4 (four) hours as needed for breakthrough pain (pain).  08/18/13   Velvet Bathe, MD  pantoprazole (PROTONIX) 40 MG tablet Take 1 tablet (40 mg total) by mouth daily. 08/03/16   Eliezer Bottom, NP  Polyethyl Glycol-Propyl Glycol (SYSTANE OP) Place 1 drop into both eyes 2 (two) times daily as needed (dry eyes).    Historical Provider, MD  potassium chloride SA (K-DUR,KLOR-CON) 20 MEQ tablet Take 1 tablet (20 mEq total) by mouth 2 (two) times daily. 08/28/13   Liliane Shi, PA-C  predniSONE (DELTASONE) 20 MG tablet Take 60 mg (3 tablets) by mouth once daily. 08/03/16   Eliezer Bottom, NP  torsemide (DEMADEX) 20 MG tablet Take 80 mg by mouth daily.    Historical Provider, MD    Family History       Family History  Problem Relation Age of Onset  . CAD Father 60    Vague history     Social History     Social History  Substance Use Topics  . Smoking status: Never Smoker  . Smokeless tobacco: Never Used  . Alcohol use No     Allergies           Patient has no known allergies.   Review of Systems Review of Systems  All other systems reviewed and are negative.    Physical Exam Updated Vital Signs BP 144/77   Pulse  88   Temp (!) 96 F (35.6 C) (Rectal)   SpO2 97%   Physical Exam  Constitutional: She is oriented to person, place, and time. She appears well-developed.  Elderly, frail  HENT:  Head: Normocephalic and atraumatic.  Eyes: Conjunctivae and EOM are normal. Pupils are equal, round, and reactive to light.  Neck: Normal range of motion and phonation normal. Neck supple.  Cardiovascular: Normal rate.   Pulmonary/Chest: Effort normal. She exhibits no tenderness.  Musculoskeletal:  Moderate lower extremity weakness, needs help to move her  legs. Laceration right proximal anterior shin, with moderate bleeding, from visualized vein. Bleeding from the vein stops with pressure, but when released, continues to bleed. Laceration left mid shin region, with moderate bleeding, which resolves with pressure.  Neurological: She is alert and oriented to person, place, and time.  Skin: Skin is warm and dry.  Psychiatric: She has a normal mood and affect. Her behavior is normal.  Nursing note and vitals reviewed.    ED Treatments / Results  Labs (all labs ordered are listed, but only abnormal results are displayed) Labs Reviewed - No data to display  EKG      EKG Interpretation None      Radiology Imaging Results (Last 48 hours)  No results found.    Procedures .Marland KitchenLaceration Repair Date/Time: 08/10/2016 9:59 AM Performed by: Daleen Bo Authorized by: Daleen Bo  Consent:    Consent obtained:  Verbal   Risks discussed:  Infection and pain   Alternatives discussed:  No treatment Anesthesia (see MAR for exact dosages):    Anesthesia method:  Local infiltration   Local anesthetic:  Lidocaine 1% w/o epi Laceration details:    Location:  Leg   Leg location:  R lower leg   Length (cm):  3   Depth (mm):  1.5 Repair type:    Repair type:  Intermediate Pre-procedure details:    Preparation:  Patient was prepped and draped in usual sterile fashion Exploration:    Hemostasis  achieved with:  Tied off vessels (Single vein)   Wound exploration: wound explored through full range of motion     Contaminated: no   Treatment:    Area cleansed with:  Betadine   Irrigation solution:  Sterile saline   Irrigation volume:  Moderate   Visualized foreign bodies/material removed: no   Skin repair:    Repair method:  Sutures   Suture material:  Prolene   Suture technique:  Simple interrupted   Number of sutures:  4 Approximation:    Approximation:  Close Post-procedure details:    Dressing:  Sterile dressing   Patient tolerance of procedure:  Tolerated well, no immediate complications .Marland KitchenLaceration Repair Date/Time: 08/10/2016 10:01 AM Performed by: Daleen Bo Authorized by: Daleen Bo  Consent:    Consent obtained:  Verbal   Consent given by:  Patient   Alternatives discussed:  No treatment Anesthesia (see MAR for exact dosages):    Anesthesia method:  Local infiltration   Local anesthetic:  Lidocaine 1% w/o epi Laceration details:    Location:  Leg   Leg location:  L lower leg   Length (cm):  5.5 Repair type:    Repair type:  Simple Exploration:    Contaminated: no   Treatment:    Area cleansed with:  Betadine   Irrigation solution:  Sterile saline   Visualized foreign bodies/material removed: no   Skin repair:    Repair method:  Sutures   Suture size:  3-0   Suture material:  Nylon   Suture technique:  Simple interrupted   Number of sutures:  5 Approximation:    Approximation:  Loose   Vermilion border: well-aligned   Post-procedure details:    Dressing:  Sterile dressing   Patient tolerance of procedure:  Tolerated well, no immediate complications  (including critical care time)  Medications Ordered in ED Medications  lidocaine (PF) (XYLOCAINE) 1 % injection 10 mL (not administered)     Initial Impression / Assessment and Plan / ED Course  I have reviewed the  triage vital signs and the nursing notes.  Pertinent labs &  imaging results that were available during my care of the patient were reviewed by me and considered in my medical decision making (see chart for details).  Clinical Course     Medications  lidocaine (PF) (XYLOCAINE) 1 % injection 10 mL (not administered)    Patient Vitals for the past 24 hrs:  BP Temp Temp src Pulse SpO2  08/10/16 0758 - (!) 96 F (35.6 C) Rectal - -  08/10/16 0747 144/77 - - 88 97 %  08/10/16 0734 - - - - 96 %    10:12 AM Reevaluation with update and discussion. After initial assessment and treatment, an updated evaluation reveals bleeding from the sutured wounds has been controlled. Eleina Jergens L    Final Clinical Impressions(s) / ED Diagnoses   Final diagnoses:  None    Accidental injury, with laceration both lower legs.  New Prescriptions    New Prescriptions   No medications on file      ED Notes   Kathe Mariner, RN  08/10/2016 11:21    Called [facility] for transport. Also called [facility] for report, left voice mail to [facility] to call back for full report.     Electronically signed by Kathe Mariner, RN at 08/10/2016 11:21 AM  Kathe Mariner, RN  08/10/2016 07:57    Per EMS pt from [facility], uses electric wheelchair , ran to a lift, co bilateral shins skin tears.  dressing placed by staff at the facility. Bleeding controled per EMS. Pt  Takes Plavix. Alert and oriented x 4,     Electronically signed by Kathe Mariner, RN at 08/10/2016 7:57 AM  Kathe Mariner, RN  08/10/2016 07:34    Bed: HD89 Expected date:  Expected time:  Means of arrival:  Comments: EMS    Electronically signed by Kathe Mariner, RN at 08/10/2016 7:34 AM      Daleen Bo, MD 08/11/16 [phone]

## 2016-08-28 ENCOUNTER — Other Ambulatory Visit: Payer: Self-pay | Admitting: Family

## 2016-08-29 ENCOUNTER — Other Ambulatory Visit (HOSPITAL_BASED_OUTPATIENT_CLINIC_OR_DEPARTMENT_OTHER): Payer: Medicare Other

## 2016-08-29 ENCOUNTER — Ambulatory Visit (HOSPITAL_BASED_OUTPATIENT_CLINIC_OR_DEPARTMENT_OTHER): Payer: Medicare Other | Admitting: Family

## 2016-08-29 VITALS — BP 123/88 | HR 108 | Temp 98.1°F | Resp 20 | Wt 199.0 lb

## 2016-08-29 DIAGNOSIS — I739 Peripheral vascular disease, unspecified: Secondary | ICD-10-CM

## 2016-08-29 DIAGNOSIS — D693 Immune thrombocytopenic purpura: Secondary | ICD-10-CM

## 2016-08-29 DIAGNOSIS — D696 Thrombocytopenia, unspecified: Secondary | ICD-10-CM

## 2016-08-29 LAB — CBC WITH DIFFERENTIAL (CANCER CENTER ONLY)
BASO#: 0 10*3/uL (ref 0.0–0.2)
BASO%: 0.1 % (ref 0.0–2.0)
EOS%: 0.7 % (ref 0.0–7.0)
Eosinophils Absolute: 0.1 10*3/uL (ref 0.0–0.5)
HCT: 45.6 % (ref 34.8–46.6)
HGB: 14.5 g/dL (ref 11.6–15.9)
LYMPH#: 1.5 10*3/uL (ref 0.9–3.3)
LYMPH%: 8.7 % — ABNORMAL LOW (ref 14.0–48.0)
MCH: 29.8 pg (ref 26.0–34.0)
MCHC: 31.8 g/dL — ABNORMAL LOW (ref 32.0–36.0)
MCV: 94 fL (ref 81–101)
MONO#: 0.4 10*3/uL (ref 0.1–0.9)
MONO%: 2.4 % (ref 0.0–13.0)
NEUT#: 15.5 10*3/uL — ABNORMAL HIGH (ref 1.5–6.5)
NEUT%: 88.1 % — ABNORMAL HIGH (ref 39.6–80.0)
RBC: 4.87 10*6/uL (ref 3.70–5.32)
RDW: 15.3 % (ref 11.1–15.7)
WBC: 17.6 10*3/uL — ABNORMAL HIGH (ref 3.9–10.0)

## 2016-08-29 LAB — CMP (CANCER CENTER ONLY)
ALT: 11 U/L (ref 10–47)
AST: 15 U/L (ref 11–38)
Albumin: 3.4 g/dL (ref 3.3–5.5)
Alkaline Phosphatase: 80 U/L (ref 26–84)
BUN, Bld: 44 mg/dL — ABNORMAL HIGH (ref 7–22)
CO2: 29 mEq/L (ref 18–33)
CREATININE: 2 mg/dL — AB (ref 0.6–1.2)
Calcium: 9.4 mg/dL (ref 8.0–10.3)
Chloride: 96 mEq/L — ABNORMAL LOW (ref 98–108)
Glucose, Bld: 174 mg/dL — ABNORMAL HIGH (ref 73–118)
Potassium: 3.8 mEq/L (ref 3.3–4.7)
SODIUM: 144 meq/L (ref 128–145)
TOTAL PROTEIN: 7.1 g/dL (ref 6.4–8.1)
Total Bilirubin: 0.8 mg/dl (ref 0.20–1.60)

## 2016-08-29 NOTE — Progress Notes (Signed)
Hematology and Oncology Follow Up Visit  [name] [name] [id number] July 17, 1931 82 y.o. 08/29/2016   Principle Diagnosis:  ITP  Current Therapy:   Observation    Interim History:  [name] [name] is here today with her transport for follow-up. She had an accident where she was maneuvering her wheelchair and hit both of her legs. This resulted in lacerations to both shins. She had some minor bleeding with this that stopped after holding compression. She is on Plavix. She missed her follow-up with our office during that time.  Her bandages are clean, dry and intact and changed daily at her assisted living facility.  She has finished her Prednisone and platelet count is now up to 77. Hgb is stable at 14.5 with an MCV of 94.   No fever, chills, n/v, cough, rash, dizziness, SOB, chest pain, palpitations, abdominal pain or changes in bowel or bladder habits.  Patient has PVD with severe venous insufficiency and swelling in both legs. She has +3 pitting edema in both legs. No numbness or tingling in her extremities. She states that she has a good appetite and is staying well hydrated. Her weight is stable.   Medications:    Medication List       Accurate as of 08/29/16  3:01 PM. Always use your most recent med list.          ACIDOPHILUS/CITRUS PECTIN Tabs Take 1 tablet by mouth daily.   albuterol (2.5 MG/3ML) 0.083% nebulizer solution Commonly known as:  PROVENTIL Take 2.5 mg by nebulization every 6 (six) hours as needed for wheezing or shortness of breath.   CALCARB 600/D PO Take 2 tablets by mouth daily.   clopidogrel 75 MG tablet Commonly known as:  PLAVIX Take 1 tablet (75 mg total) by mouth daily with breakfast.   diltiazem 240 MG 24 hr capsule Commonly known as:  CARDIZEM CD Take 1 capsule (240 mg total) by mouth daily.   famciclovir 250 MG tablet Commonly known as:  FAMVIR Take 1 tablet (250 mg total) by mouth daily.   furosemide 40 MG tablet Commonly known as:   LASIX Take 1 tablet (40 mg total) by mouth 2 (two) times daily.   gabapentin 300 MG capsule Commonly known as:  NEURONTIN Take 600 mg by mouth 3 (three) times daily.   metoprolol 100 MG tablet Commonly known as:  LOPRESSOR Take 1 tablet (100 mg total) by mouth 2 (two) times daily.   morphine 15 MG 12 hr tablet Commonly known as:  MS CONTIN Take 3 tablets (45 mg total) by mouth every 12 (twelve) hours.   morphine 20 MG 24 hr capsule Commonly known as:  KADIAN Take 20 mg by mouth 2 (two) times daily.   oxyCODONE 5 MG immediate release tablet Commonly known as:  Oxy IR/ROXICODONE Take 1 tablet (5 mg total) by mouth every 6 (six) hours as needed for breakthrough pain (pain).   pantoprazole 40 MG tablet Commonly known as:  PROTONIX Take 1 tablet (40 mg total) by mouth daily.   potassium chloride SA 20 MEQ tablet Commonly known as:  K-DUR,KLOR-CON Take 1 tablet (20 mEq total) by mouth 2 (two) times daily.   predniSONE 20 MG tablet Commonly known as:  DELTASONE Take 60 mg (3 tablets) by mouth once daily.   sodium chloride 0.65 % Soln nasal spray Commonly known as:  OCEAN Place 1 spray into both nostrils 2 (two) times daily as needed for congestion.   SYSTANE OP Place 1 drop into both  eyes 2 (two) times daily.   torsemide 20 MG tablet Commonly known as:  DEMADEX Take 80 mg by mouth daily.       Allergies: No Known Allergies  Past Medical History, Surgical history, Social history, and Family History were reviewed and updated.  Review of Systems: All other 10 point review of systems is negative.   Physical Exam:  vitals were not taken for this visit.  Wt Readings from Last 3 Encounters:  08/03/16 223 lb 1.9 oz (101.2 kg)  10/09/13 200 lb (90.7 kg)  08/28/13 205 lb (93 kg)    Ocular: Sclerae unicteric, pupils equal, round and reactive to light Ear-nose-throat: Oropharynx clear, dentition fair Lymphatic: No cervical supraclavicular or axillary adenopathy Lungs  no rales or rhonchi, good excursion bilaterally Heart regular rate and rhythm, no murmur appreciated Abd soft, nontender, positive bowel sounds, no liver or spleen tip palpated on exam, no fluid wave  MSK no focal spinal tenderness, no joint edema Neuro: non-focal, well-oriented, appropriate affect Breasts: Deferred  Lab Results  Component Value Date   WBC 17.6 (H) 08/29/2016   HGB 14.5 08/29/2016   HCT 45.6 08/29/2016   MCV 94 08/29/2016   PLT 77 Platelet count consistent in citrate (L) 08/29/2016   No results found for: FERRITIN, IRON, TIBC, UIBC, IRONPCTSAT Lab Results  Component Value Date   RBC 4.87 08/29/2016   No results found for: KPAFRELGTCHN, LAMBDASER, KAPLAMBRATIO No results found for: IGGSERUM, IGA, IGMSERUM No results found for: TOTALPROTELP, ALBUMINELP, A1GS, A2GS, BETS, BETA2SER, GAMS, MSPIKE, SPEI   Chemistry      Component Value Date/Time   NA 146 (H) 08/03/2016 1439   K 3.7 08/03/2016 1439   CL 97 (L) 08/03/2016 1439   CO2 31 08/03/2016 1439   BUN 25 (H) 08/03/2016 1439   CREATININE 1.4 (H) 08/03/2016 1439      Component Value Date/Time   CALCIUM 9.1 08/03/2016 1439   ALKPHOS 90 (H) 08/03/2016 1439   AST 18 08/03/2016 1439   ALT 12 08/03/2016 1439   BILITOT 1.80 (H) 08/03/2016 1439     Impression and Plan: [name] [name] is a very pleasant 82 yo female with history of ITP. Her platelet count has improved on Prednisone. She is now up to 77. I spoke with Dr. Marin Olp and we will leave her off of Prednisone for now and plan to see her back in 1 month for repeat lab work and follow-up.  She will continue on Famvir and Protonix daily.  Her living facility will contact us with any questions or concerns. We can certainly see her sooner if need be.   Eliezer Bottom, NP 12/13/20173:01 PM

## 2016-08-30 ENCOUNTER — Encounter: Payer: Self-pay | Admitting: Hematology & Oncology

## 2016-09-05 ENCOUNTER — Encounter (HOSPITAL_COMMUNITY): Payer: Self-pay | Admitting: Nurse Practitioner

## 2016-09-05 ENCOUNTER — Emergency Department (HOSPITAL_COMMUNITY)
Admission: EM | Admit: 2016-09-05 | Discharge: 2016-09-05 | Disposition: A | Discharge status: Home / Self Care | Payer: Medicare Other | Attending: Emergency Medicine | Admitting: Emergency Medicine

## 2016-09-05 DIAGNOSIS — I110 Hypertensive heart disease with heart failure: Secondary | ICD-10-CM | POA: Insufficient documentation

## 2016-09-05 DIAGNOSIS — W1830XA Fall on same level, unspecified, initial encounter: Secondary | ICD-10-CM | POA: Diagnosis not present

## 2016-09-05 DIAGNOSIS — Z79899 Other long term (current) drug therapy: Secondary | ICD-10-CM | POA: Insufficient documentation

## 2016-09-05 DIAGNOSIS — I5032 Chronic diastolic (congestive) heart failure: Secondary | ICD-10-CM | POA: Insufficient documentation

## 2016-09-05 DIAGNOSIS — S81811A Laceration without foreign body, right lower leg, initial encounter: Secondary | ICD-10-CM | POA: Insufficient documentation

## 2016-09-05 DIAGNOSIS — Y999 Unspecified external cause status: Secondary | ICD-10-CM | POA: Insufficient documentation

## 2016-09-05 DIAGNOSIS — Y939 Activity, unspecified: Secondary | ICD-10-CM | POA: Diagnosis not present

## 2016-09-05 DIAGNOSIS — Y92002 Bathroom of unspecified non-institutional (private) residence single-family (private) house as the place of occurrence of the external cause: Secondary | ICD-10-CM | POA: Insufficient documentation

## 2016-09-05 DIAGNOSIS — I2510 Atherosclerotic heart disease of native coronary artery without angina pectoris: Secondary | ICD-10-CM | POA: Diagnosis not present

## 2016-09-05 NOTE — ED Notes (Signed)
Patient is alert and oriented x3.  She was given DC instructions and follow up visit instructions.  Patient gave verbal understanding. She was DC via stretcher to  Nursing home.  V/S stable.  He was not showing any signs of distress on DC

## 2016-09-05 NOTE — Discharge Instructions (Signed)
Wet to Dry dressings twice a day Return to ER for signs of infection

## 2016-09-05 NOTE — ED Notes (Signed)
Bed: [facility] Expected date:  Expected time:  Means of arrival:  Comments: EMS- 82yo F, fall/head lac

## 2016-09-05 NOTE — ED Triage Notes (Signed)
Patient fell in the bathroom and has skin tear on her right lower leg. Patient has a clotting disorder.

## 2016-09-05 NOTE — ED Provider Notes (Signed)
[facility] DEPT Provider Note   CSN: [id number] Arrival date & time: 09/05/16  1218     History   Chief Complaint No chief complaint on file.   HPI [name] [name] is a 82 y.o. female.  HPI  Patient presents to the emergency department after injuring her right lower extremity resulting in large skin tear of her right lower extremity.  No other injury.  Symptoms of the ER for further evaluation of the large skin tear.  Denies head injury.  No other complaints.    Past Medical History:  Diagnosis Date  . Atrial fibrillation ([place])   . CAD (coronary artery disease)    a. NSTEMI 2/14 => LHC 10/20/12:  dLM 20%, pLAD 99%, mLAD 60% followed by 99%, oD1 99%, and pD2 30%, mild plaque disease in the CFX and RCA. PCI: Xience Xpedition DES to the proximal and mid LAD  . Chronic diastolic CHF (congestive heart failure) ([place])    Echo (08/10/13): Mild LVH, EF 60-65%.  . DJD (degenerative joint disease)   . Dyslipidemia   . HTN (hypertension)   . Osteopenia   . Thrombocytopenia ([place])   . Venous ulcer     Patient Active Problem List   Diagnosis Date Noted  . Coronary atherosclerosis of native coronary artery 10/09/2013  . Atrial fibrillation ([place]) 10/09/2013  . Chronic diastolic heart failure ([place]) 10/09/2013  . Leukocytosis, unspecified 08/11/2013  . Sepsis ([place]) 08/10/2013  . Cellulitis of leg 08/10/2013  . Atrial fibrillation with RVR ([place]) 08/10/2013  . Cellulitis of left lower extremity 07/16/2013  . Chronic narcotic dependence ([place]) 07/16/2013  . Varicose veins of lower extremities with other complications 02/63/7858  . Edema 05/14/2013  . Bacteremia 12/26/2012  . Severe sepsis ([place]) 12/26/2012  . Cholecystitis, acute 12/20/2012  . Hypokalemia 12/20/2012  . CAD (coronary artery disease) of artery bypass graft 12/18/2012  . Unstable angina ([place]) 10/18/2012  . Thrombocytopenia ([place]) 10/18/2012  . Chest pain 10/17/2012  . HTN (hypertension) 10/17/2012  . Dyslipidemia  10/17/2012  . Chronic back pain 10/17/2012  . Precordial pain 10/17/2012    Past Surgical History:  Procedure Laterality Date  . BACK SURGERY     Lumbar disc  . EYE SURGERY     Blepharoplasty  . HIP SURGERY     R THR  . INSERTION / PLACEMENT / REVISION NEUROSTIMULATOR    . JOINT REPLACEMENT    . LEFT HEART CATHETERIZATION WITH CORONARY ANGIOGRAM N/A 10/20/2012   Procedure: LEFT HEART CATHETERIZATION WITH CORONARY ANGIOGRAM;  Surgeon: Burnell Blanks, MD;  Location: [facility] CATH LAB;  Service: Cardiovascular;  Laterality: N/A;    OB History    No data available       Home Medications    Prior to Admission medications   Medication Sig Start Date End Date Taking? Authorizing Provider  albuterol (PROVENTIL) (2.5 MG/3ML) 0.083% nebulizer solution Take 2.5 mg by nebulization every 6 (six) hours as needed for wheezing or shortness of breath.    Historical Provider, MD  Calcium Carbonate-Vitamin D (CALCARB 600/D PO) Take 2 tablets by mouth daily.    Historical Provider, MD  clopidogrel (PLAVIX) 75 MG tablet Take 1 tablet (75 mg total) by mouth daily with breakfast. Patient not taking: Reported on 08/10/2016 08/18/13   Velvet Bathe, MD  diltiazem (CARDIZEM CD) 240 MG 24 hr capsule Take 1 capsule (240 mg total) by mouth daily. 08/18/13   Velvet Bathe, MD  famciclovir (FAMVIR) 250 MG tablet Take 1 tablet (250 mg total)  by mouth daily. 08/03/16   Eliezer Bottom, NP  gabapentin (NEURONTIN) 600 MG tablet Take 600 mg by mouth 3 (three) times daily.     Historical Provider, MD  Lactobacillus Acid-Pectin (ACIDOPHILUS/CITRUS PECTIN) TABS Take 1 tablet by mouth daily.    Historical Provider, MD  metoprolol (LOPRESSOR) 100 MG tablet Take 1 tablet (100 mg total) by mouth 2 (two) times daily. 08/18/13   Velvet Bathe, MD  Morphine Sulfate ER 15 MG T12A Take by mouth 2 (two) times daily.    Historical Provider, MD  oxyCODONE (OXY IR/ROXICODONE) 5 MG immediate release tablet Take 1 tablet (5 mg total)  by mouth every 6 (six) hours as needed for breakthrough pain (pain). Patient taking differently: Take 5 mg by mouth every 4 (four) hours. And 64m every four hours as needed for pain 08/18/13   OVelvet Bathe MD  pantoprazole (PROTONIX) 40 MG tablet Take 1 tablet (40 mg total) by mouth daily. 08/03/16   SEliezer Bottom NP  Polyethyl Glycol-Propyl Glycol (SYSTANE OP) Place 1 drop into both eyes 2 (two) times daily.    Historical Provider, MD  potassium chloride SA (K-DUR,KLOR-CON) 20 MEQ tablet Take 1 tablet (20 mEq total) by mouth 2 (two) times daily. 08/28/13   SLiliane Shi PA-C  predniSONE (DELTASONE) 20 MG tablet Take 60 mg (3 tablets) by mouth once daily. Patient taking differently: Take 60 mg by mouth daily.  08/03/16   SEliezer Bottom NP  sodium chloride (OCEAN) 0.65 % SOLN nasal spray Place 1 spray into both nostrils 2 (two) times daily as needed for congestion.    Historical Provider, MD  torsemide (DEMADEX) 20 MG tablet Take 80 mg by mouth daily.    Historical Provider, MD    Family History Family History  Problem Relation Age of Onset  . CAD Father 640   Vague history     Social History Social History  Substance Use Topics  . Smoking status: Never Smoker  . Smokeless tobacco: Never Used  . Alcohol use No     Allergies   Patient has no known allergies.   Review of Systems Review of Systems  All other systems reviewed and are negative.    Physical Exam Updated Vital Signs BP 106/70 (BP Location: Left Arm)   Pulse (!) 51   Temp 98.1 F (36.7 C) (Oral)   Resp 24   Ht _0  (1.575 m)   Wt 199 lb (90.3 kg)   SpO2 (!) 85%   BMI 36.40 kg/m   Physical Exam  Constitutional: She is oriented to person, place, and time. She appears well-developed and well-nourished. No distress.  HENT:  Head: Normocephalic and atraumatic.  Eyes: EOM are normal.  Neck: Normal range of motion.  Cardiovascular: Normal rate and regular rhythm.   Pulmonary/Chest: Effort normal  and breath sounds normal.  Abdominal: Soft. She exhibits no distension. There is no tenderness.  Musculoskeletal:  Large skin tear of the right lower extremity on the proximal right lateral lower leg.  Small amount of active bleeding.  Normal pulses right foot.  Neurological: She is alert and oriented to person, place, and time.  Skin: Skin is warm and dry.  Psychiatric: She has a normal mood and affect. Judgment normal.  Nursing note and vitals reviewed.    ED Treatments / Results  Labs (all labs ordered are listed, but only abnormal results are displayed) Labs Reviewed - No data to display  EKG  EKG Interpretation None  Radiology No results found.  Procedures Procedures (including critical care time)    +++++++++++++++++++++++++++++++++++++++++++++++++++++  LACERATION REPAIR Performed by: Hoy Morn Consent: Verbal consent obtained. Risks and benefits: risks, benefits and alternatives were discussed Patient identity confirmed: provided demographic data Time out performed prior to procedure Prepped and Draped in normal sterile fashion Wound explored Laceration Location: Right lateral lower leg Laceration Length: 5 cm No Foreign Bodies seen or palpated Anesthesia: local infiltration Local anesthetic: lidocaine 2 % with epinephrine Anesthetic total: 7 ml Irrigation method: syringe Amount of cleaning: standard Skin closure: 3-0 prolene Number of sutures or staples: 7 Technique: running interlocked Patient tolerance: Patient tolerated the procedure well with no immediate complications.  +++++++++++++++++++++++++++++++++++++++++++++++++++   Medications Ordered in ED Medications - No data to display   Initial Impression / Assessment and Plan / ED Course  I have reviewed the triage vital signs and the nursing notes.  Pertinent labs & imaging results that were available during my care of the patient were reviewed by me and considered in my medical  decision making (see chart for details).  Clinical Course     Patient is overall well-appearing.  Laceration/skin tear repaired.  Unable to approximate all of the skin tear as there was some tissue loss.  Is to be repaired with wet-to-dry dressings and time.  Suture removal in 14 days    Final Clinical Impressions(s) / ED Diagnoses   Final diagnoses:  Noninfected skin tear of right lower extremity, initial encounter    New Prescriptions New Prescriptions   No medications on file     Jola Schmidt, MD 09/05/16 [phone]

## 2016-09-12 ENCOUNTER — Inpatient Hospital Stay (HOSPITAL_COMMUNITY)
Admission: EM | Admit: 2016-09-12 | Discharge: 2016-09-20 | DRG: 871 | Disposition: A | Discharge status: Skilled Nursing Facility | Payer: Medicare Other | Attending: Internal Medicine | Admitting: Internal Medicine

## 2016-09-12 ENCOUNTER — Emergency Department (HOSPITAL_COMMUNITY): Payer: Medicare Other

## 2016-09-12 ENCOUNTER — Encounter (HOSPITAL_COMMUNITY): Payer: Self-pay | Admitting: Emergency Medicine

## 2016-09-12 ENCOUNTER — Inpatient Hospital Stay (HOSPITAL_COMMUNITY): Payer: Medicare Other

## 2016-09-12 DIAGNOSIS — R339 Retention of urine, unspecified: Secondary | ICD-10-CM | POA: Diagnosis present

## 2016-09-12 DIAGNOSIS — D693 Immune thrombocytopenic purpura: Secondary | ICD-10-CM | POA: Diagnosis present

## 2016-09-12 DIAGNOSIS — L97811 Non-pressure chronic ulcer of other part of right lower leg limited to breakdown of skin: Secondary | ICD-10-CM | POA: Diagnosis present

## 2016-09-12 DIAGNOSIS — I96 Gangrene, not elsewhere classified: Secondary | ICD-10-CM

## 2016-09-12 DIAGNOSIS — I25810 Atherosclerosis of coronary artery bypass graft(s) without angina pectoris: Secondary | ICD-10-CM | POA: Diagnosis present

## 2016-09-12 DIAGNOSIS — L03115 Cellulitis of right lower limb: Secondary | ICD-10-CM | POA: Diagnosis present

## 2016-09-12 DIAGNOSIS — Z7902 Long term (current) use of antithrombotics/antiplatelets: Secondary | ICD-10-CM

## 2016-09-12 DIAGNOSIS — M549 Dorsalgia, unspecified: Secondary | ICD-10-CM

## 2016-09-12 DIAGNOSIS — R0602 Shortness of breath: Secondary | ICD-10-CM

## 2016-09-12 DIAGNOSIS — J189 Pneumonia, unspecified organism: Secondary | ICD-10-CM | POA: Diagnosis present

## 2016-09-12 DIAGNOSIS — Y95 Nosocomial condition: Secondary | ICD-10-CM | POA: Diagnosis present

## 2016-09-12 DIAGNOSIS — B961 Klebsiella pneumoniae [K. pneumoniae] as the cause of diseases classified elsewhere: Secondary | ICD-10-CM | POA: Diagnosis present

## 2016-09-12 DIAGNOSIS — M479 Spondylosis, unspecified: Secondary | ICD-10-CM | POA: Diagnosis present

## 2016-09-12 DIAGNOSIS — I739 Peripheral vascular disease, unspecified: Secondary | ICD-10-CM | POA: Diagnosis present

## 2016-09-12 DIAGNOSIS — G8929 Other chronic pain: Secondary | ICD-10-CM | POA: Diagnosis present

## 2016-09-12 DIAGNOSIS — I2510 Atherosclerotic heart disease of native coronary artery without angina pectoris: Secondary | ICD-10-CM | POA: Diagnosis present

## 2016-09-12 DIAGNOSIS — J440 Chronic obstructive pulmonary disease with acute lower respiratory infection: Secondary | ICD-10-CM | POA: Diagnosis present

## 2016-09-12 DIAGNOSIS — I5032 Chronic diastolic (congestive) heart failure: Secondary | ICD-10-CM | POA: Diagnosis present

## 2016-09-12 DIAGNOSIS — I252 Old myocardial infarction: Secondary | ICD-10-CM

## 2016-09-12 DIAGNOSIS — I130 Hypertensive heart and chronic kidney disease with heart failure and stage 1 through stage 4 chronic kidney disease, or unspecified chronic kidney disease: Secondary | ICD-10-CM | POA: Diagnosis present

## 2016-09-12 DIAGNOSIS — E876 Hypokalemia: Secondary | ICD-10-CM | POA: Diagnosis not present

## 2016-09-12 DIAGNOSIS — J9690 Respiratory failure, unspecified, unspecified whether with hypoxia or hypercapnia: Secondary | ICD-10-CM

## 2016-09-12 DIAGNOSIS — J9621 Acute and chronic respiratory failure with hypoxia: Secondary | ICD-10-CM | POA: Insufficient documentation

## 2016-09-12 DIAGNOSIS — J9601 Acute respiratory failure with hypoxia: Secondary | ICD-10-CM

## 2016-09-12 DIAGNOSIS — J181 Lobar pneumonia, unspecified organism: Secondary | ICD-10-CM | POA: Diagnosis present

## 2016-09-12 DIAGNOSIS — I872 Venous insufficiency (chronic) (peripheral): Secondary | ICD-10-CM | POA: Diagnosis present

## 2016-09-12 DIAGNOSIS — N390 Urinary tract infection, site not specified: Secondary | ICD-10-CM | POA: Diagnosis present

## 2016-09-12 DIAGNOSIS — Z9689 Presence of other specified functional implants: Secondary | ICD-10-CM | POA: Diagnosis present

## 2016-09-12 DIAGNOSIS — A419 Sepsis, unspecified organism: Principal | ICD-10-CM | POA: Diagnosis present

## 2016-09-12 DIAGNOSIS — B029 Zoster without complications: Secondary | ICD-10-CM | POA: Diagnosis present

## 2016-09-12 DIAGNOSIS — L03116 Cellulitis of left lower limb: Secondary | ICD-10-CM | POA: Diagnosis present

## 2016-09-12 DIAGNOSIS — Z79891 Long term (current) use of opiate analgesic: Secondary | ICD-10-CM

## 2016-09-12 DIAGNOSIS — Z6838 Body mass index (BMI) 38.0-38.9, adult: Secondary | ICD-10-CM

## 2016-09-12 DIAGNOSIS — Z66 Do not resuscitate: Secondary | ICD-10-CM | POA: Diagnosis present

## 2016-09-12 DIAGNOSIS — J9602 Acute respiratory failure with hypercapnia: Secondary | ICD-10-CM | POA: Diagnosis present

## 2016-09-12 DIAGNOSIS — I272 Pulmonary hypertension, unspecified: Secondary | ICD-10-CM | POA: Diagnosis present

## 2016-09-12 DIAGNOSIS — L03119 Cellulitis of unspecified part of limb: Secondary | ICD-10-CM | POA: Diagnosis not present

## 2016-09-12 DIAGNOSIS — Z7952 Long term (current) use of systemic steroids: Secondary | ICD-10-CM

## 2016-09-12 DIAGNOSIS — I4891 Unspecified atrial fibrillation: Secondary | ICD-10-CM | POA: Diagnosis present

## 2016-09-12 DIAGNOSIS — I10 Essential (primary) hypertension: Secondary | ICD-10-CM | POA: Diagnosis present

## 2016-09-12 DIAGNOSIS — D72829 Elevated white blood cell count, unspecified: Secondary | ICD-10-CM | POA: Diagnosis present

## 2016-09-12 DIAGNOSIS — E785 Hyperlipidemia, unspecified: Secondary | ICD-10-CM | POA: Diagnosis present

## 2016-09-12 DIAGNOSIS — N183 Chronic kidney disease, stage 3 (moderate): Secondary | ICD-10-CM | POA: Diagnosis present

## 2016-09-12 DIAGNOSIS — R6520 Severe sepsis without septic shock: Secondary | ICD-10-CM | POA: Diagnosis present

## 2016-09-12 DIAGNOSIS — K219 Gastro-esophageal reflux disease without esophagitis: Secondary | ICD-10-CM | POA: Diagnosis present

## 2016-09-12 DIAGNOSIS — D696 Thrombocytopenia, unspecified: Secondary | ICD-10-CM | POA: Diagnosis present

## 2016-09-12 DIAGNOSIS — Z955 Presence of coronary angioplasty implant and graft: Secondary | ICD-10-CM

## 2016-09-12 DIAGNOSIS — F1120 Opioid dependence, uncomplicated: Secondary | ICD-10-CM | POA: Diagnosis present

## 2016-09-12 LAB — URINALYSIS, ROUTINE W REFLEX MICROSCOPIC
Bilirubin Urine: NEGATIVE
Glucose, UA: NEGATIVE mg/dL
Hgb urine dipstick: NEGATIVE
Ketones, ur: NEGATIVE mg/dL
NITRITE: NEGATIVE
PH: 6 (ref 5.0–8.0)
Protein, ur: NEGATIVE mg/dL
SPECIFIC GRAVITY, URINE: 1.008 (ref 1.005–1.030)

## 2016-09-12 LAB — CBC
HCT: 37 % (ref 36.0–46.0)
HEMOGLOBIN: 11.1 g/dL — AB (ref 12.0–15.0)
MCH: 27.4 pg (ref 26.0–34.0)
MCHC: 30 g/dL (ref 30.0–36.0)
MCV: 91.4 fL (ref 78.0–100.0)
PLATELETS: 111 10*3/uL — AB (ref 150–400)
RBC: 4.05 MIL/uL (ref 3.87–5.11)
RDW: 15.8 % — ABNORMAL HIGH (ref 11.5–15.5)
WBC: 11.2 10*3/uL — ABNORMAL HIGH (ref 4.0–10.5)

## 2016-09-12 LAB — BRAIN NATRIURETIC PEPTIDE: B Natriuretic Peptide: 1218 pg/mL — ABNORMAL HIGH (ref 0.0–100.0)

## 2016-09-12 LAB — BASIC METABOLIC PANEL
Anion gap: 7 (ref 5–15)
BUN: 31 mg/dL — ABNORMAL HIGH (ref 6–20)
CO2: 31 mmol/L (ref 22–32)
CREATININE: 1.33 mg/dL — AB (ref 0.44–1.00)
Calcium: 7.9 mg/dL — ABNORMAL LOW (ref 8.9–10.3)
Chloride: 101 mmol/L (ref 101–111)
GFR calc Af Amer: 42 mL/min — ABNORMAL LOW (ref >60)
GFR, EST NON AFRICAN AMERICAN: 36 mL/min — AB (ref >60)
Glucose, Bld: 128 mg/dL — ABNORMAL HIGH (ref 65–99)
Potassium: 4.3 mmol/L (ref 3.5–5.1)
Sodium: 139 mmol/L (ref 135–145)

## 2016-09-12 LAB — MRSA PCR SCREENING: MRSA by PCR: POSITIVE — AB

## 2016-09-12 LAB — APTT: aPTT: 40 seconds — ABNORMAL HIGH (ref 24–36)

## 2016-09-12 LAB — I-STAT TROPONIN, ED: TROPONIN I, POC: 0.01 ng/mL (ref 0.00–0.08)

## 2016-09-12 LAB — PROCALCITONIN: Procalcitonin: 0.8 ng/mL

## 2016-09-12 LAB — I-STAT CG4 LACTIC ACID, ED
LACTIC ACID, VENOUS: 1.94 mmol/L — AB (ref 0.5–1.9)
LACTIC ACID, VENOUS: 1.19 mmol/L (ref 0.5–1.9)

## 2016-09-12 LAB — LACTIC ACID, PLASMA: Lactic Acid, Venous: 1.5 mmol/L (ref 0.5–1.9)

## 2016-09-12 LAB — PROTIME-INR
INR: 1.47
PROTHROMBIN TIME: 18 s — AB (ref 11.4–15.2)

## 2016-09-12 LAB — STREP PNEUMONIAE URINARY ANTIGEN: STREP PNEUMO URINARY ANTIGEN: NEGATIVE

## 2016-09-12 MED ORDER — ALBUTEROL SULFATE (2.5 MG/3ML) 0.083% IN NEBU: 2.5000 mg | INHALATION_SOLUTION | Freq: Four times a day (QID) | RESPIRATORY_TRACT | Status: DC | PRN

## 2016-09-12 MED ORDER — PANTOPRAZOLE SODIUM 40 MG PO TBEC
40.0000 mg | DELAYED_RELEASE_TABLET | Freq: Every day | ORAL | Status: DC
  Administered 2016-09-13 – 2016-09-20 (×8): 40 mg via ORAL
  Filled 2016-09-12 (×8): qty 1

## 2016-09-12 MED ORDER — SODIUM CHLORIDE 0.9 % IV BOLUS (SEPSIS)
1000.0000 mL | Freq: Once | INTRAVENOUS | Status: AC
  Administered 2016-09-12: 1000 mL via INTRAVENOUS

## 2016-09-12 MED ORDER — OXYCODONE HCL 5 MG PO TABS
5.0000 mg | ORAL_TABLET | Freq: Four times a day (QID) | ORAL | Status: DC | PRN
  Administered 2016-09-12: 5 mg via ORAL
  Filled 2016-09-12: qty 1

## 2016-09-12 MED ORDER — FUROSEMIDE 10 MG/ML IJ SOLN: 80.0000 mg | Freq: Two times a day (BID) | INTRAMUSCULAR | Status: DC

## 2016-09-12 MED ORDER — FUROSEMIDE 10 MG/ML IJ SOLN: 20.0000 mg | Freq: Two times a day (BID) | INTRAMUSCULAR | Status: DC

## 2016-09-12 MED ORDER — DILTIAZEM HCL 25 MG/5ML IV SOLN
5.0000 mg | Freq: Once | INTRAVENOUS | Status: AC | PRN
  Administered 2016-09-12: 10 mg via INTRAVENOUS
  Filled 2016-09-12: qty 5

## 2016-09-12 MED ORDER — HYDROCORTISONE NA SUCCINATE PF 100 MG IJ SOLR
100.0000 mg | Freq: Once | INTRAMUSCULAR | Status: AC
  Administered 2016-09-12: 100 mg via INTRAVENOUS
  Filled 2016-09-12: qty 2

## 2016-09-12 MED ORDER — GUAIFENESIN ER 600 MG PO TB12
600.0000 mg | ORAL_TABLET | Freq: Two times a day (BID) | ORAL | Status: DC | PRN
  Administered 2016-09-16: 600 mg via ORAL
  Filled 2016-09-12: qty 1

## 2016-09-12 MED ORDER — ALBUTEROL SULFATE (2.5 MG/3ML) 0.083% IN NEBU: 2.5000 mg | INHALATION_SOLUTION | RESPIRATORY_TRACT | Status: DC | PRN

## 2016-09-12 MED ORDER — ACETAMINOPHEN 325 MG PO TABS
650.0000 mg | ORAL_TABLET | Freq: Once | ORAL | Status: AC
  Administered 2016-09-12: 650 mg via ORAL
  Filled 2016-09-12: qty 2

## 2016-09-12 MED ORDER — VALACYCLOVIR HCL 500 MG PO TABS
1000.0000 mg | ORAL_TABLET | Freq: Every day | ORAL | Status: DC
  Administered 2016-09-12 – 2016-09-20 (×9): 1000 mg via ORAL
  Filled 2016-09-12 (×11): qty 2

## 2016-09-12 MED ORDER — SODIUM CHLORIDE 0.9 % IV SOLN
INTRAVENOUS | Status: DC
  Administered 2016-09-12 – 2016-09-13 (×2): via INTRAVENOUS

## 2016-09-12 MED ORDER — TORSEMIDE 20 MG PO TABS: 80.0000 mg | ORAL_TABLET | Freq: Every day | ORAL | Status: DC

## 2016-09-12 MED ORDER — HYDROCERIN EX CREA
TOPICAL_CREAM | Freq: Two times a day (BID) | CUTANEOUS | Status: DC
  Administered 2016-09-12: 23:00:00 via TOPICAL
  Administered 2016-09-13 (×2): 1 via TOPICAL
  Administered 2016-09-14 – 2016-09-20 (×9): via TOPICAL
  Filled 2016-09-12: qty 113

## 2016-09-12 MED ORDER — POTASSIUM CHLORIDE CRYS ER 20 MEQ PO TBCR
20.0000 meq | EXTENDED_RELEASE_TABLET | Freq: Two times a day (BID) | ORAL | Status: DC
  Administered 2016-09-12 (×2): 20 meq via ORAL
  Filled 2016-09-12 (×3): qty 1

## 2016-09-12 MED ORDER — HYDROCORTISONE NA SUCCINATE PF 100 MG IJ SOLR
50.0000 mg | Freq: Four times a day (QID) | INTRAMUSCULAR | Status: DC
  Administered 2016-09-12 – 2016-09-15 (×13): 50 mg via INTRAVENOUS
  Filled 2016-09-12 (×14): qty 2

## 2016-09-12 MED ORDER — GABAPENTIN 300 MG PO CAPS
600.0000 mg | ORAL_CAPSULE | Freq: Three times a day (TID) | ORAL | Status: DC
  Administered 2016-09-12 – 2016-09-17 (×16): 600 mg via ORAL
  Filled 2016-09-12 (×16): qty 2

## 2016-09-12 MED ORDER — OXYCODONE HCL 5 MG PO TABS
5.0000 mg | ORAL_TABLET | ORAL | Status: DC
  Administered 2016-09-12 – 2016-09-13 (×3): 5 mg via ORAL
  Filled 2016-09-12 (×3): qty 1

## 2016-09-12 MED ORDER — METOPROLOL TARTRATE 100 MG PO TABS
100.0000 mg | ORAL_TABLET | Freq: Two times a day (BID) | ORAL | Status: DC
  Administered 2016-09-13 – 2016-09-20 (×15): 100 mg via ORAL
  Filled 2016-09-12 (×16): qty 1

## 2016-09-12 MED ORDER — DILTIAZEM HCL ER COATED BEADS 120 MG PO CP24
240.0000 mg | ORAL_CAPSULE | Freq: Every day | ORAL | Status: DC
  Filled 2016-09-12: qty 2

## 2016-09-12 MED ORDER — DILTIAZEM HCL ER COATED BEADS 120 MG PO CP24
240.0000 mg | ORAL_CAPSULE | Freq: Once | ORAL | Status: AC
  Administered 2016-09-12: 240 mg via ORAL

## 2016-09-12 MED ORDER — DEXTROSE 5 % IV SOLN
2.0000 g | INTRAVENOUS | Status: DC
  Administered 2016-09-12 – 2016-09-14 (×3): 2 g via INTRAVENOUS
  Filled 2016-09-12 (×4): qty 2

## 2016-09-12 MED ORDER — PIPERACILLIN-TAZOBACTAM 3.375 G IVPB 30 MIN
3.3750 g | Freq: Once | INTRAVENOUS | Status: AC
  Administered 2016-09-12: 3.375 g via INTRAVENOUS
  Filled 2016-09-12: qty 50

## 2016-09-12 MED ORDER — PANTOPRAZOLE SODIUM 40 MG PO TBEC
40.0000 mg | DELAYED_RELEASE_TABLET | Freq: Every day | ORAL | Status: DC
  Administered 2016-09-12: 40 mg via ORAL
  Filled 2016-09-12: qty 1

## 2016-09-12 MED ORDER — VANCOMYCIN HCL 10 G IV SOLR
1250.0000 mg | INTRAVENOUS | Status: DC
  Administered 2016-09-13 – 2016-09-15 (×3): 1250 mg via INTRAVENOUS
  Filled 2016-09-12 (×3): qty 1250

## 2016-09-12 MED ORDER — VANCOMYCIN HCL IN DEXTROSE 1-5 GM/200ML-% IV SOLN
1000.0000 mg | Freq: Once | INTRAVENOUS | Status: AC
  Administered 2016-09-12: 1000 mg via INTRAVENOUS
  Filled 2016-09-12: qty 200

## 2016-09-12 NOTE — ED Notes (Signed)
Spoke with Dr. Marily Memos about AFIB rate 130-140's. Report to give her cardizem home dose.

## 2016-09-12 NOTE — Consult Note (Signed)
[facility] Consult/Admission Note  [name] [name] November 08, 1930  [id number].    Requesting MD: Dr. Linna Darner Chief Complaint/Reason for Consult: Pretibial wounds  HPI:  Pt is an 82 year old female, who resides at [facility], with a history of atrial fibrillation, CAD status post  N STEMI 2014 , dCHF, HTN, HLD, ITP on chronic steroids and Valtrex, COPD O2 dependent at 2L who presented to the [facility] ED with complaints of SOB and non productive cough. Pt was found to have wounds to her pretibial regions bilaterally on exam. Pt states the wounds occurred weeks ago although not at the same time. Pt states the left wound was from bumping her leg on a piece of equipment with exposed screws. The right wound occurred on 10/11/15 and perhaps again on 12/20. Pt was seen in the ED on these dates for RLE wounds. The left wound occurred first. Pt states she is not on antibiotics for these. She states that her wounds are not cellulitis. She states the pain as severe, nonradiating, constant, worse with touch, nothing makes it better. She states the pain has not changed. Pt states the nursing staff at [facility] care for her wounds daily. She denies fever, chills, abdominal pain, nausea, vomiting, headaches.  ED Course: Sepsis protocol placed, pt was febrile, with tachycardia, leukocytosis, Lactic acid of 1.94 which has since resolved to 1.19, BNP of 1218, elevated creatinine, and negative troponin. Chest Xray   ROS:  Review of Systems  Constitutional: Negative for chills, diaphoresis, fever and weight loss.  HENT: Negative for sore throat.   Eyes: Negative for pain.  Respiratory: Positive for shortness of breath.   Cardiovascular: Positive for leg swelling. Negative for chest pain.  Gastrointestinal: Negative for abdominal pain, nausea and vomiting.  Skin: Positive for rash (of her BLE, dry scaly skin).       Wound to BLE  Neurological: Negative for speech change and loss of consciousness.    All other systems reviewed and are negative.    Family History  Problem Relation Age of Onset  . CAD Father 101    Vague history     Past Medical History:  Diagnosis Date  . Atrial fibrillation ([place])   . CAD (coronary artery disease)    a. NSTEMI 2/14 => LHC 10/20/12:  dLM 20%, pLAD 99%, mLAD 60% followed by 99%, oD1 99%, and pD2 30%, mild plaque disease in the CFX and RCA. PCI: Xience Xpedition DES to the proximal and mid LAD  . Chronic diastolic CHF (congestive heart failure) ([place])    Echo (08/10/13): Mild LVH, EF 60-65%.  . DJD (degenerative joint disease)   . Dyslipidemia   . HTN (hypertension)   . Osteopenia   . Thrombocytopenia ([place])   . Venous ulcer     Past Surgical History:  Procedure Laterality Date  . BACK SURGERY     Lumbar disc  . EYE SURGERY     Blepharoplasty  . HIP SURGERY     R THR  . INSERTION / PLACEMENT / REVISION NEUROSTIMULATOR    . JOINT REPLACEMENT    . LEFT HEART CATHETERIZATION WITH CORONARY ANGIOGRAM N/A 10/20/2012   Procedure: LEFT HEART CATHETERIZATION WITH CORONARY ANGIOGRAM;  Surgeon: Burnell Blanks, MD;  Location: [facility] CATH LAB;  Service: Cardiovascular;  Laterality: N/A;    Social History:  reports that she has never smoked. She has never used smokeless tobacco. She reports that she does not drink alcohol or use drugs.  Allergies:  Allergies  Allergen Reactions  . Keflex [Cephalexin] Other (See Comments)    intolerance     (Not in a hospital admission)  Blood pressure 109/93, pulse (!) 138, temperature 97.5 F (36.4 C), temperature source Oral, resp. rate 17, SpO2 97 %. Physical Exam: General: elderly woman, appears frustrated, non cooperative, WD/WN, who is laying in bed in NAD HEENT: head is normocephalic, atraumatic.  Sclera are noninjected. Lids normal,  PERRL.  Oral mucosa is pink and moist Heart: irregular rhythm, tachycardia.  No obvious murmurs, gallops, or rubs noted.  2+ radial  Lungs: CTAB, no wheezes, rhonchi, or  rales noted. Decreased breath sounds of the bases bilaterally.  Respiratory effort non labored, rate normal Abd: soft, NT/ND, +BS,  MS: 2+ pitting edema of the BLE, scaly skin noted to BLE, increased erythema noted to RLE, wounds bilaterally, right wound with black eschar, very TTP of both wounds Skin: warm and dry with no other rashes noted Psych: A&Ox3 with an appropriate affect, depressed mood Neuro: CM grossly 2-12 intact, normal speech   Right lower leg    Left lower leg  Results for orders placed or performed during the hospital encounter of 09/12/16 (from the past 48 hour(s))  Basic metabolic panel     Status: Abnormal   Collection Time: 09/12/16  4:17 AM  Result Value Ref Range   Sodium 139 135 - 145 mmol/L   Potassium 4.3 3.5 - 5.1 mmol/L   Chloride 101 101 - 111 mmol/L   CO2 31 22 - 32 mmol/L   Glucose, Bld 128 (H) 65 - 99 mg/dL   BUN 31 (H) 6 - 20 mg/dL   Creatinine, Ser 1.33 (H) 0.44 - 1.00 mg/dL   Calcium 7.9 (L) 8.9 - 10.3 mg/dL   GFR calc non Af Amer 36 (L) >60 mL/min   GFR calc Af Amer 42 (L) >60 mL/min    Comment: (NOTE) The eGFR has been calculated using the CKD EPI equation. This calculation has not been validated in all clinical situations. eGFR's persistently <60 mL/min signify possible Chronic Kidney Disease.    Anion gap 7 5 - 15  CBC     Status: Abnormal   Collection Time: 09/12/16  4:17 AM  Result Value Ref Range   WBC 11.2 (H) 4.0 - 10.5 K/uL   RBC 4.05 3.87 - 5.11 MIL/uL   Hemoglobin 11.1 (L) 12.0 - 15.0 g/dL   HCT 37.0 36.0 - 46.0 %   MCV 91.4 78.0 - 100.0 fL   MCH 27.4 26.0 - 34.0 pg   MCHC 30.0 30.0 - 36.0 g/dL   RDW 15.8 (H) 11.5 - 15.5 %   Platelets 111 (L) 150 - 400 K/uL    Comment: REPEATED TO VERIFY PLATELET COUNT CONFIRMED BY SMEAR   Brain natriuretic peptide     Status: Abnormal   Collection Time: 09/12/16  4:17 AM  Result Value Ref Range   B Natriuretic Peptide 1,218.0 (H) 0.0 - 100.0 pg/mL  I-stat troponin, ED     Status: None     Collection Time: 09/12/16  4:23 AM  Result Value Ref Range   Troponin i, poc 0.01 0.00 - 0.08 ng/mL   Comment 3            Comment: Due to the release kinetics of cTnI, a negative result within the first hours of the onset of symptoms does not rule out myocardial infarction with certainty. If myocardial infarction is still suspected, repeat the test at appropriate intervals.  Urinalysis, Routine w reflex microscopic     Status: Abnormal   Collection Time: 09/12/16  4:38 AM  Result Value Ref Range   Color, Urine YELLOW YELLOW   APPearance CLEAR CLEAR   Specific Gravity, Urine 1.008 1.005 - 1.030   pH 6.0 5.0 - 8.0   Glucose, UA NEGATIVE NEGATIVE mg/dL   Hgb urine dipstick NEGATIVE NEGATIVE   Bilirubin Urine NEGATIVE NEGATIVE   Ketones, ur NEGATIVE NEGATIVE mg/dL   Protein, ur NEGATIVE NEGATIVE mg/dL   Nitrite NEGATIVE NEGATIVE   Leukocytes, UA TRACE (A) NEGATIVE   RBC / HPF 0-5 0 - 5 RBC/hpf   WBC, UA 6-30 0 - 5 WBC/hpf   Bacteria, UA RARE (A) NONE SEEN   Squamous Epithelial / LPF 0-5 (A) NONE SEEN  I-Stat CG4 Lactic Acid, ED     Status: Abnormal   Collection Time: 09/12/16  5:02 AM  Result Value Ref Range   Lactic Acid, Venous 1.94 (HH) 0.5 - 1.9 mmol/L  Procalcitonin     Status: None   Collection Time: 09/12/16  8:09 AM  Result Value Ref Range   Procalcitonin 0.80 ng/mL    Comment:        Interpretation: PCT > 0.5 ng/mL and <= 2 ng/mL: Systemic infection (sepsis) is possible, but other conditions are known to elevate PCT as well. (NOTE)         ICU PCT Algorithm               Non ICU PCT Algorithm    ----------------------------     ------------------------------         PCT < 0.25 ng/mL                 PCT < 0.1 ng/mL     Stopping of antibiotics            Stopping of antibiotics       strongly encouraged.               strongly encouraged.    ----------------------------     ------------------------------       PCT level decrease by               PCT < 0.25  ng/mL       >= 80% from peak PCT       OR PCT 0.25 - 0.5 ng/mL          Stopping of antibiotics                                             encouraged.     Stopping of antibiotics           encouraged.    ----------------------------     ------------------------------       PCT level decrease by              PCT >= 0.25 ng/mL       < 80% from peak PCT        AND PCT >= 0.5 ng/mL             Continuing antibiotics  encouraged.       Continuing antibiotics            encouraged.    ----------------------------     ------------------------------     PCT level increase compared          PCT > 0.5 ng/mL         with peak PCT AND          PCT >= 0.5 ng/mL             Escalation of antibiotics                                          strongly encouraged.      Escalation of antibiotics        strongly encouraged.   I-Stat CG4 Lactic Acid, ED     Status: None   Collection Time: 09/12/16  8:15 AM  Result Value Ref Range   Lactic Acid, Venous 1.19 0.5 - 1.9 mmol/L   Dg Chest Port 1 View  Result Date: 09/12/2016 CLINICAL DATA:  Shortness of breath.  Code sepsis. EXAM: PORTABLE CHEST 1 VIEW COMPARISON:  Radiographs 08/09/2013 FINDINGS: The heart is enlarged. Unchanged tortuosity of the thoracic aorta. There is cephalization of pulmonary vasculature in peribronchial cuffing concerning for pulmonary edema. Small focal opacity in the left midlung zone. No large pleural effusion. No pneumothorax. Spinal stimulator and postsurgical change in the spine. Advanced degenerative change of both shoulders. IMPRESSION: 1. Patchy focal airspace opacity left midlung zone, concerning for pneumonia. 2. Cardiomegaly. Vascular congestion and probable pulmonary edema. Suspect an element of CHF. Electronically Signed   By: Jeb Levering M.D.   On: 09/12/2016 05:48      Assessment/Plan Pt is being admitted to the hospital for Sepsis  BLE wounds Her wounds do not  appear infections although there may be some cellulitis of her RLE with the increased redness noted. There is eschar and bedside debridement may help with healing of the wound of the RLE. Will discuss with MD.   Kalman Drape, [facility] 09/12/2016, 10:47 AM Pager: [phone] Consults: [phone] Mon-Fri 7:00 am-4:30 pm Sat-Sun 7:00 am-11:30 am

## 2016-09-12 NOTE — ED Notes (Signed)
Dr. Marily Memos made aware pt's HR remains in AFIB but is continually staying in 130's-140's. Will do PRN order.

## 2016-09-12 NOTE — ED Notes (Signed)
Attempt to take patient off NRB and transition to [place] at 6 L. Oxygen saturation dropped to 89%. Put back on NRB.

## 2016-09-12 NOTE — Progress Notes (Signed)
Patient arrived from ED. Patient on nonrebreather with O2 Sats at 97. Per ED nurse, patient unable to maintain O2 Sat above 89 on 5 L . Will continue to monitor. Patient resting comfortably in bed.  Milford Cage, RN

## 2016-09-12 NOTE — ED Notes (Signed)
Attempt to place patient on the venturi mask, but oxygen dropped to 88%. Pt placed back on the NRB.

## 2016-09-12 NOTE — Consult Note (Signed)
[facility] Nurse wound consult note Reason for Consult: bilateral pretibial wounds Wound type: full thickness Pressure Ulcer POA: NA, from injury Measurement:Right pretibial 10cm x 8cm x 0.3cm 40% black boggy eschar, 60% red granulating bed, minimal bloody drainage, malodorous Left pretibial 4cm x 3.5cm 0.2cm full thickness, 100% red granulating tissue, scant serosanguinous drainage, no odor noted. Cellulitis to both lower legs Wound bed:see above Drainage (amount, consistency, odor) see above Periwound:dry flaky skin Dressing procedure/placement/frequency: ED Physician called in to observe wound, he will initiate a surgical consult for right pretibial wound. I have provided nurses with orders for NS wet to dry dressing until surgical consult arrives. We will not follow, but will remain available to this patient, to nursing, and the medical and/or surgical teams.  Please re-consult if we need to assist further.    Fara Olden, RN-C, WTA-C Wound Treatment Associate

## 2016-09-12 NOTE — ED Notes (Signed)
Pt on bedpan.

## 2016-09-12 NOTE — Progress Notes (Signed)
ANTIBIOTIC CONSULT NOTE - INITIAL  Pharmacy Consult for Vanco/Cefepime Indication: Cefepime, Cellulitis  Allergies  Allergen Reactions  . Keflex [Cephalexin] Other (See Comments)    intolerance    Patient Measurements:   Adjusted Body Weight:   Vital Signs: Temp: 97.5 F (36.4 C) (12/27 0737) Temp Source: Oral (12/27 0737) BP: 103/66 (12/27 0900) Pulse Rate: 116 (12/27 0900) Intake/Output from previous day: 12/26 0701 - 12/27 0700 In: 2250 [I.V.:2250] Out: 450 [Urine:450] Intake/Output from this shift: Total I/O In: -  Out: 200 [Urine:200]  Labs:  Recent Labs  09/12/16 0417  WBC 11.2*  HGB 11.1*  PLT 111*  CREATININE 1.33*   Estimated Creatinine Clearance: 33.5 mL/min (by C-G formula based on SCr of 1.33 mg/dL (H)). No results for input(s): VANCOTROUGH, VANCOPEAK, VANCORANDOM, GENTTROUGH, GENTPEAK, GENTRANDOM, TOBRATROUGH, TOBRAPEAK, TOBRARND, AMIKACINPEAK, AMIKACINTROU, AMIKACIN in the last 72 hours.   Microbiology: No results found for this or any previous visit (from the past 720 hour(s)).  Medical History: Past Medical History:  Diagnosis Date  . Atrial fibrillation ([place])   . CAD (coronary artery disease)    a. NSTEMI 2/14 => LHC 10/20/12:  dLM 20%, pLAD 99%, mLAD 60% followed by 99%, oD1 99%, and pD2 30%, mild plaque disease in the CFX and RCA. PCI: Xience Xpedition DES to the proximal and mid LAD  . Chronic diastolic CHF (congestive heart failure) ([place])    Echo (08/10/13): Mild LVH, EF 60-65%.  . DJD (degenerative joint disease)   . Dyslipidemia   . HTN (hypertension)   . Osteopenia   . Thrombocytopenia ([place])   . Venous ulcer     Medications: f/u med rec  Assessment: [name] presents with SOB  and nonproductive cought from NH. Has been getting treated for right lower extremity cellulitis with course of Keflex. Start abx for sepsis with fever, tachycardia/afib, O2 requirement on NRB. CXR concerning for PNA and cellulitis n+ CHF exac. - WBC 11.2,  LA 1.4, Temp 102.5, O2 sats 84%  PMH: afib, dHFpEF, HTN, HLD, CAD, DJD,osteopenia, thrombocytopenia, (ITP) depression,   Anticoag: h/o afib with CHA2DS2-VASc score 6, not on  on anticoagulation due to ITP .   ID: Sepsis from HCAP vs LLE cellulits. Temp 102.5, WBC 11.2  Antimicrobials this admission:  Zosyn 12/27 x 1 in ED Vanco 12/27>> Cefepime 12/27>>   Goal of Therapy:  Vancomycin trough level 15-20 mcg/ml  Plan:  Vancomycin 1228m IV q24h Cefepime 2g IV q24h Vancomycin trough after 3-5 doses at steady state.   [name] [name] S. RAlford Highland PharmD, BEdgewoodClinical Staff Pharmacist Pager [phone] [place] C[place]row Rock12/27/2017,9:36 AM

## 2016-09-12 NOTE — H&P (Signed)
History and Physical    [name] [name] OVZ:[id number] DOB: [birth year] DOA: 09/12/2016   PCP: Mathews Argyle, MD   Patient coming from SNF     Chief Complaint: Shortness of breath   HPI: [name] [name] is a 82 y.o. female with medical history significant for atrial fibrillation, CAD status post  N STEMI 2014 , dCHF, HTN, HLD,  ITP on chronic steroids and Valtrex, COPD O2 dependent at 2L , brought to the ED with one day history of shortness of breath and non productive cough. The patient was a difficult historian, that has not provide much information but it is not confused. She was febrile, but denied subjective fever or chills. She denies any chest pain or palpitations. She denies any nausea or vomiting. She denies any abdominal pain. She has chronic lower extremity edema, and has been getting treated for right lower extremity cellulitis on the left, with the course of Keflex. No confusion is reported, she denies any headaches, or vision changes.   ED Course:  BP 107/68   Pulse 115   Temp 97.5 F (36.4 C) (Oral)   Resp 23   SpO2 97%    CXR left middle lobe infiltration, consistent with HCAP.  WBC 11.2,  lactate 1.4, On presentation  temperature max  102.5, tachycardia and tachypnea, oxygen saturation 84% on room air.  IV vancomycin and Zosyn was started in ED. she also received up-to-date 2 L of fluid. other data includes platelets 111 K, creatinine 1.33, calcium 7.9, glucose 128, I/O +1800.  Review of Systems: As per HPI otherwise 10 point review of systems negative.   Past Medical History:  Diagnosis Date  . Atrial fibrillation ([place])   . CAD (coronary artery disease)    a. NSTEMI 2/14 => LHC 10/20/12:  dLM 20%, pLAD 99%, mLAD 60% followed by 99%, oD1 99%, and pD2 30%, mild plaque disease in the CFX and RCA. PCI: Xience Xpedition DES to the proximal and mid LAD  . Chronic diastolic CHF (congestive heart failure) ([place])    Echo (08/10/13): Mild LVH, EF 60-65%.  . DJD  (degenerative joint disease)   . Dyslipidemia   . HTN (hypertension)   . Osteopenia   . Thrombocytopenia ([place])   . Venous ulcer     Past Surgical History:  Procedure Laterality Date  . BACK SURGERY     Lumbar disc  . EYE SURGERY     Blepharoplasty  . HIP SURGERY     R THR  . INSERTION / PLACEMENT / REVISION NEUROSTIMULATOR    . JOINT REPLACEMENT    . LEFT HEART CATHETERIZATION WITH CORONARY ANGIOGRAM N/A 10/20/2012   Procedure: LEFT HEART CATHETERIZATION WITH CORONARY ANGIOGRAM;  Surgeon: Burnell Blanks, MD;  Location: [facility] CATH LAB;  Service: Cardiovascular;  Laterality: N/A;    Social History Social History   Social History  . Marital status: Divorced    Spouse name: N/A  . Number of children: 2  . Years of education: N/A   Occupational History  .  Retired   Social History Main Topics  . Smoking status: Never Smoker  . Smokeless tobacco: Never Used  . Alcohol use No  . Drug use: No  . Sexual activity: Not on file   Other Topics Concern  . Not on file   Social History Narrative   Lives at [facility].  Two adopted children. Divorced.       Allergies  Allergen Reactions  . Keflex [Cephalexin] Other (See Comments)  intolerance    Family History  Problem Relation Age of Onset  . CAD Father 61    Vague history       Prior to Admission medications   Medication Sig Start Date End Date Taking? Authorizing Provider  albuterol (PROVENTIL) (2.5 MG/3ML) 0.083% nebulizer solution Take 2.5 mg by nebulization every 6 (six) hours as needed for wheezing or shortness of breath.    Historical Provider, MD  Calcium Carbonate-Vitamin D (CALCARB 600/D PO) Take 2 tablets by mouth daily.    Historical Provider, MD  clopidogrel (PLAVIX) 75 MG tablet Take 1 tablet (75 mg total) by mouth daily with breakfast. Patient not taking: Reported on 08/10/2016 08/18/13   Velvet Bathe, MD  diltiazem (CARDIZEM CD) 240 MG 24 hr capsule Take 1 capsule (240 mg total) by mouth daily.  08/18/13   Velvet Bathe, MD  famciclovir (FAMVIR) 250 MG tablet Take 1 tablet (250 mg total) by mouth daily. 08/03/16   Eliezer Bottom, NP  gabapentin (NEURONTIN) 600 MG tablet Take 600 mg by mouth 3 (three) times daily.     Historical Provider, MD  Lactobacillus Acid-Pectin (ACIDOPHILUS/CITRUS PECTIN) TABS Take 1 tablet by mouth daily.    Historical Provider, MD  metoprolol (LOPRESSOR) 100 MG tablet Take 1 tablet (100 mg total) by mouth 2 (two) times daily. 08/18/13   Velvet Bathe, MD  Morphine Sulfate ER 15 MG T12A Take by mouth 2 (two) times daily.    Historical Provider, MD  oxyCODONE (OXY IR/ROXICODONE) 5 MG immediate release tablet Take 1 tablet (5 mg total) by mouth every 6 (six) hours as needed for breakthrough pain (pain). Patient taking differently: Take 5 mg by mouth every 4 (four) hours. And 73m every four hours as needed for pain 08/18/13   OVelvet Bathe MD  pantoprazole (PROTONIX) 40 MG tablet Take 1 tablet (40 mg total) by mouth daily. 08/03/16   SEliezer Bottom NP  Polyethyl Glycol-Propyl Glycol (SYSTANE OP) Place 1 drop into both eyes 2 (two) times daily.    Historical Provider, MD  potassium chloride SA (K-DUR,KLOR-CON) 20 MEQ tablet Take 1 tablet (20 mEq total) by mouth 2 (two) times daily. 08/28/13   SLiliane Shi PA-C  predniSONE (DELTASONE) 20 MG tablet Take 60 mg (3 tablets) by mouth once daily. Patient taking differently: Take 60 mg by mouth daily.  08/03/16   SEliezer Bottom NP  sodium chloride (OCEAN) 0.65 % SOLN nasal spray Place 1 spray into both nostrils 2 (two) times daily as needed for congestion.    Historical Provider, MD  torsemide (DEMADEX) 20 MG tablet Take 80 mg by mouth daily.    Historical Provider, MD    Physical Exam:    Vitals:   09/12/16 0720 09/12/16 0737 09/12/16 0745 09/12/16 0800  BP: 100/69  108/94 107/68  Pulse: 104  (!) 122 115  Resp: _0 Temp:  97.5 F (36.4 C)    TempSrc:  Oral    SpO2: 100%  95% 97%        Constitutional: NAD, calm, uncomfortable, chronically ill appearing, wearing oxygen mask, due to Osats 83 in room air. Vitals:   09/12/16 0720 09/12/16 0737 09/12/16 0745 09/12/16 0800  BP: 100/69  108/94 107/68  Pulse: 104  (!) 122 115  Resp: _1 Temp:  97.5 F (36.4 C)    TempSrc:  Oral    SpO2: 100%  95% 97%   Eyes: PERRL, lids and conjunctivae normal ENMT:  Mucous membranes are moist. Posterior pharynx clear of any exudate or lesions.Normal dentition.  Neck: normal, supple, no masses, no thyromegaly Respiratory: Decreased breath sounds of the bases,, mild rhonchi, no wheezing at this time. Normal respiratory effort. No accessory muscle use.  Cardiovascular:tachy  rate and regular rhythm, no murmurs / rubs / gallops. 2 + extremity edema. 2+ pedal pulses. No carotid bruits.  Abdomen:  Obese, no tenderness, no masses palpated. No hepatosplenomegaly. Bowel sounds positive.  Musculoskeletal: no clubbing / cyanosis. No joint deformity upper and lower extremities. Good ROM, no contractures. Normal muscle tone.  Skin: LLE tear  consistent with cellulitis which has been treated as OP . Chronic venous stasis biklaterally  Neurologic: CN 2-12 grossly intact. Sensation intact, DTR normal. Strength 5/5 in all 4.  Psychiatric: Normal judgment and insight. Alert and oriented x 3.Flat affect    Labs on Admission: I have personally reviewed following labs and imaging studies  CBC:  Recent Labs Lab 09/12/16 0417  WBC 11.2*  HGB 11.1*  HCT 37.0  MCV 91.4  PLT 111*    Basic Metabolic Panel:  Recent Labs Lab 09/12/16 0417  NA 139  K 4.3  CL 101  CO2 31  GLUCOSE 128*  BUN 31*  CREATININE 1.33*  CALCIUM 7.9*    GFR: Estimated Creatinine Clearance: 33.5 mL/min (by C-G formula based on SCr of 1.33 mg/dL (H)).  Liver Function Tests: No results for input(s): AST, ALT, ALKPHOS, BILITOT, PROT, ALBUMIN in the last 168 hours. No results for input(s): LIPASE, AMYLASE in  the last 168 hours. No results for input(s): AMMONIA in the last 168 hours.  Coagulation Profile: No results for input(s): INR, PROTIME in the last 168 hours.  Cardiac Enzymes: No results for input(s): CKTOTAL, CKMB, CKMBINDEX, TROPONINI in the last 168 hours.  BNP (last 3 results) No results for input(s): PROBNP in the last 8760 hours.  HbA1C: No results for input(s): HGBA1C in the last 72 hours.  CBG: No results for input(s): GLUCAP in the last 168 hours.  Lipid Profile: No results for input(s): CHOL, HDL, LDLCALC, TRIG, CHOLHDL, LDLDIRECT in the last 72 hours.  Thyroid Function Tests: No results for input(s): TSH, T4TOTAL, FREET4, T3FREE, THYROIDAB in the last 72 hours.  Anemia Panel: No results for input(s): VITAMINB12, FOLATE, FERRITIN, TIBC, IRON, RETICCTPCT in the last 72 hours.  Urine analysis:    Component Value Date/Time   COLORURINE YELLOW 09/12/2016 [place] 09/12/2016 0438   LABSPEC 1.008 09/12/2016 0438   PHURINE 6.0 09/12/2016 0438   GLUCOSEU NEGATIVE 09/12/2016 0438   HGBUR NEGATIVE 09/12/2016 0438   BILIRUBINUR NEGATIVE 09/12/2016 0438   KETONESUR NEGATIVE 09/12/2016 0438   PROTEINUR NEGATIVE 09/12/2016 0438   UROBILINOGEN 0.2 08/10/2013 0037   NITRITE NEGATIVE 09/12/2016 0438   LEUKOCYTESUR TRACE (A) 09/12/2016 0438    Sepsis Labs: _0 (procalcitonin:4,lacticidven:4) )No results found for this or any previous visit (from the past 240 hour(s)).   Radiological Exams on Admission: Dg Chest Port 1 View  Result Date: 09/12/2016 CLINICAL DATA:  Shortness of breath.  Code sepsis. EXAM: PORTABLE CHEST 1 VIEW COMPARISON:  Radiographs 08/09/2013 FINDINGS: The heart is enlarged. Unchanged tortuosity of the thoracic aorta. There is cephalization of pulmonary vasculature in peribronchial cuffing concerning for pulmonary edema. Small focal opacity in the left midlung zone. No large pleural effusion. No pneumothorax. Spinal stimulator and  postsurgical change in the spine. Advanced degenerative change of both shoulders. IMPRESSION: 1. Patchy focal airspace opacity left midlung zone, concerning  for pneumonia. 2. Cardiomegaly. Vascular congestion and probable pulmonary edema. Suspect an element of CHF. Electronically Signed   By: Jeb Levering M.D.   On: 09/12/2016 05:48    EKG: Independently reviewed.  Assessment/Plan Active Problems:   Sepsis ([place])   Cellulitis of leg   HCAP (healthcare-associated pneumonia)   HTN (hypertension)   Dyslipidemia   Chronic back pain   Thrombocytopenia ([facility])   CAD (coronary artery disease) of artery bypass graft   Chronic narcotic dependence ([facility])   Atrial fibrillation with RVR ([facility])   Leukocytosis   Chronic diastolic heart failure ([facility])    Sepsis likely due to respiratory source, in the setting of LLE cellulitis, organism unknown  Patient meets criteria given tachycardia, tachypnea, fever, leukocytosis, and evidence of organ dysfunction.  Antibiotics delivered in the ED with Vanc and Zosyn . UA trace leuk, Initial Lactic acid 1.94->1.14   EKG AfibTach.  Admit to SDU Sepsis order set  IV antibiotics by pharmacy with Vanco and Cefepime as per sepsis order set  Nebs Follow lactic acid q 3 hrs Follow blood culture IV fluids at 100 cc/h.  Procalcitonin order set   Repeat CBC in am  Hydrocortisone 100 mg IV x1 and then 50 mg q6 IV   Wound care consult for her LLE cellulitis while treatment with antibiotics continues    Acute Respiratory Failure with Hypoxia likely due to HCAP and CHF exacerbation  Presenting now with progressive symptoms including productive cough, fever up to 102 . Osats 83 in RA . CXR today suggests L midline zone patchy infiltrate . Other labs as above . Currently on NRB  Oxygen   sputum cultures  IV antibiotics as above  Procalcitonin,Strep pneumo urine antigen, Legionella urine antigen Nebulizers as needed Mucinex prn  IV fluids antipyretics Repeat CBC in  am   Chronic diastolic heart failure with possible exacerbation , last echocardiogram in 2014  with normal LVF, EF 60 weight 90.3 kg  BNP  1218. Osats 83 in RA, currently at 2 L   CXR with possible pulmonary edema and vascular congestion, cardiomegaly .  Tn neg. EGK without ACS Lasix  Dose to be determined based on the patient's clinical codition. Hold home Demadex for now Careful use of IVF , received 2 l bolus, receiving 1 more liter, then at 100 cc/h   Daily weights, strict I/O CXR in am 2 D echo    Chronic kidney disease stage  3 baseline creatinine 1.4    Current Cr 1.33 Lab Results  Component Value Date   CREATININE 1.33 (H) 09/12/2016   CREATININE 2.0 (H) 08/29/2016   CREATININE 1.4 (H) 08/03/2016  IVF  Lasix 80 mg bid IV to be determined based on her clinical condition,  Demadex on hold   Repeat CMET in am Place foley cath as patient retaning fluid as above      Hypertension BP 107/68   Pulse 115   Temp 97.5 F (36.4 C) (Oral)   Resp 23   SpO2 97%  Continue home anti-hypertensive medications tomorrow, will hold Lopressor today due to current hypotensive state  Atrial Fibrillation CHA2DS2-VASc score 6, not on  on anticoagulation due to ITP  -Continue meds once BP stabilizes as above mentioned    Thrombocytopenia due to ITP , currently at 111k No transfusion is indicated at this time Monitor counts closely Transfuse 1 unit of platelets if count is less or equal than 10,000 or 20,000 if the patient is acutely bleeding   GERD, no  acute symptoms: Continue PPI   DVT prophylaxis SCD's  Code Status:    DNR Family Communication:  Discussed with patient Disposition Plan: Expect patient to be discharged to  Nursing home after condition improves Consults called:    None Admission status:  SDU    Lilymarie Scroggins E, PA-C [facility]   09/12/2016, 9:02 AM

## 2016-09-12 NOTE — ED Triage Notes (Signed)
Patient arrived with EMS from [place] home reports SOB onset this morning .

## 2016-09-12 NOTE — ED Provider Notes (Signed)
[facility] DEPT Provider Note   CSN: [id number] Arrival date & time: 09/12/16  0357     History   Chief Complaint Chief Complaint  Patient presents with  . Shortness of Breath    HPI [name] [name] is a 82 y.o. female.  HPI 82 year old female who presents with shortness of breath. She has a history of atrial fibrillation, chronic diastolic heart failure with preserved EF, hypertension and hyperlipidemia. She presents from nursing facility where she has been complaining of one day of shortness of breath with nonproductive cough. Has been getting treated for right lower extremity cellulitis with course of Keflex. Denies chest pain, nausea or vomiting, diarrhea or abdominal pain.   Past Medical History:  Diagnosis Date  . Atrial fibrillation ([place])   . CAD (coronary artery disease)    a. NSTEMI 2/14 => LHC 10/20/12:  dLM 20%, pLAD 99%, mLAD 60% followed by 99%, oD1 99%, and pD2 30%, mild plaque disease in the CFX and RCA. PCI: Xience Xpedition DES to the proximal and mid LAD  . Chronic diastolic CHF (congestive heart failure) ([place])    Echo (08/10/13): Mild LVH, EF 60-65%.  . DJD (degenerative joint disease)   . Dyslipidemia   . HTN (hypertension)   . Osteopenia   . Thrombocytopenia ([place])   . Venous ulcer     Patient Active Problem List   Diagnosis Date Noted  . HCAP (healthcare-associated pneumonia) 09/12/2016  . Coronary atherosclerosis of native coronary artery 10/09/2013  . Atrial fibrillation ([place]) 10/09/2013  . Chronic diastolic heart failure ([place]) 10/09/2013  . Leukocytosis 08/11/2013  . Sepsis ([place]) 08/10/2013  . Cellulitis of leg 08/10/2013  . Atrial fibrillation with RVR ([place]) 08/10/2013  . Cellulitis of left lower extremity 07/16/2013  . Chronic narcotic dependence ([place]) 07/16/2013  . Varicose veins of lower extremities with other complications 21/30/8657  . Edema 05/14/2013  . Bacteremia 12/26/2012  . Severe sepsis ([place]) 12/26/2012  . Cholecystitis,  acute 12/20/2012  . Hypokalemia 12/20/2012  . CAD (coronary artery disease) of artery bypass graft 12/18/2012  . Unstable angina ([place]) 10/18/2012  . Thrombocytopenia ([place]) 10/18/2012  . Chest pain 10/17/2012  . HTN (hypertension) 10/17/2012  . Dyslipidemia 10/17/2012  . Chronic back pain 10/17/2012  . Precordial pain 10/17/2012    Past Surgical History:  Procedure Laterality Date  . BACK SURGERY     Lumbar disc  . EYE SURGERY     Blepharoplasty  . HIP SURGERY     R THR  . INSERTION / PLACEMENT / REVISION NEUROSTIMULATOR    . JOINT REPLACEMENT    . LEFT HEART CATHETERIZATION WITH CORONARY ANGIOGRAM N/A 10/20/2012   Procedure: LEFT HEART CATHETERIZATION WITH CORONARY ANGIOGRAM;  Surgeon: Burnell Blanks, MD;  Location: [facility] CATH LAB;  Service: Cardiovascular;  Laterality: N/A;    OB History    No data available       Home Medications    Prior to Admission medications   Medication Sig Start Date End Date Taking? Authorizing Provider  albuterol (PROVENTIL) (2.5 MG/3ML) 0.083% nebulizer solution Take 2.5 mg by nebulization every 6 (six) hours as needed for wheezing or shortness of breath.    Historical Provider, MD  Calcium Carbonate-Vitamin D (CALCARB 600/D PO) Take 2 tablets by mouth daily.    Historical Provider, MD  clopidogrel (PLAVIX) 75 MG tablet Take 1 tablet (75 mg total) by mouth daily with breakfast. Patient not taking: Reported on 08/10/2016 08/18/13   Velvet Bathe, MD  diltiazem (CARDIZEM CD) 240  MG 24 hr capsule Take 1 capsule (240 mg total) by mouth daily. 08/18/13   Velvet Bathe, MD  famciclovir (FAMVIR) 250 MG tablet Take 1 tablet (250 mg total) by mouth daily. 08/03/16   Eliezer Bottom, NP  gabapentin (NEURONTIN) 600 MG tablet Take 600 mg by mouth 3 (three) times daily.     Historical Provider, MD  Lactobacillus Acid-Pectin (ACIDOPHILUS/CITRUS PECTIN) TABS Take 1 tablet by mouth daily.    Historical Provider, MD  metoprolol (LOPRESSOR) 100 MG tablet Take  1 tablet (100 mg total) by mouth 2 (two) times daily. 08/18/13   Velvet Bathe, MD  Morphine Sulfate ER 15 MG T12A Take by mouth 2 (two) times daily.    Historical Provider, MD  oxyCODONE (OXY IR/ROXICODONE) 5 MG immediate release tablet Take 1 tablet (5 mg total) by mouth every 6 (six) hours as needed for breakthrough pain (pain). Patient taking differently: Take 5 mg by mouth every 4 (four) hours. And 29m every four hours as needed for pain 08/18/13   OVelvet Bathe MD  pantoprazole (PROTONIX) 40 MG tablet Take 1 tablet (40 mg total) by mouth daily. 08/03/16   SEliezer Bottom NP  Polyethyl Glycol-Propyl Glycol (SYSTANE OP) Place 1 drop into both eyes 2 (two) times daily.    Historical Provider, MD  potassium chloride SA (K-DUR,KLOR-CON) 20 MEQ tablet Take 1 tablet (20 mEq total) by mouth 2 (two) times daily. 08/28/13   SLiliane Shi PA-C  predniSONE (DELTASONE) 20 MG tablet Take 60 mg (3 tablets) by mouth once daily. Patient taking differently: Take 60 mg by mouth daily.  08/03/16   SEliezer Bottom NP  sodium chloride (OCEAN) 0.65 % SOLN nasal spray Place 1 spray into both nostrils 2 (two) times daily as needed for congestion.    Historical Provider, MD  torsemide (DEMADEX) 20 MG tablet Take 80 mg by mouth daily.    Historical Provider, MD    Family History Family History  Problem Relation Age of Onset  . CAD Father 610   Vague history     Social History Social History  Substance Use Topics  . Smoking status: Never Smoker  . Smokeless tobacco: Never Used  . Alcohol use No     Allergies   Patient has no known allergies.   Review of Systems Review of Systems 10/14 systems reviewed and are negative other than those stated in the HPI   Physical Exam Updated Vital Signs BP 100/69   Pulse 104   Temp 97.5 F (36.4 C) (Oral)   Resp 19   SpO2 100%   Physical Exam Physical Exam  Nursing note and vitals reviewed. Constitutional: elderly woman in mild respiratory  distress Head: Normocephalic and atraumatic.  Mouth/Throat: Oropharynx is clear and dry.  Neck: Normal range of motion. Neck supple.  Cardiovascular: Tachycardic rate and regular rhythm.  Pulmonary/Chest: increased effort with tachypnea, and breath sounds diminished with breath sounds at bases Abdominal: Soft. There is no tenderness. There is no rebound and no guarding.  Musculoskeletal: Normal range of motion.  Neurological: Alert, no facial droop, fluent speech, moves all extremities symmetrically Skin: Skin is warm and dry. right lover extremity with skin tear over lateral lower leg, surrounding erythema, induration and warmth tracking up the right thigh Psychiatric: Cooperative   ED Treatments / Results  Labs (all labs ordered are listed, but only abnormal results are displayed) Labs Reviewed  BASIC METABOLIC PANEL - Abnormal; Notable for the following:  Result Value   Glucose, Bld 128 (*)    BUN 31 (*)    Creatinine, Ser 1.33 (*)    Calcium 7.9 (*)    GFR calc non Af Amer 36 (*)    GFR calc Af Amer 42 (*)    All other components within normal limits  CBC - Abnormal; Notable for the following:    WBC 11.2 (*)    Hemoglobin 11.1 (*)    RDW 15.8 (*)    Platelets 111 (*)    All other components within normal limits  BRAIN NATRIURETIC PEPTIDE - Abnormal; Notable for the following:    B Natriuretic Peptide 1,218.0 (*)    All other components within normal limits  URINALYSIS, ROUTINE W REFLEX MICROSCOPIC - Abnormal; Notable for the following:    Leukocytes, UA TRACE (*)    Bacteria, UA RARE (*)    Squamous Epithelial / LPF 0-5 (*)    All other components within normal limits  I-STAT CG4 LACTIC ACID, ED - Abnormal; Notable for the following:    Lactic Acid, Venous 1.94 (*)    All other components within normal limits  CULTURE, BLOOD (ROUTINE X 2)  CULTURE, BLOOD (ROUTINE X 2)  I-STAT TROPOININ, ED  I-STAT CG4 LACTIC ACID, ED    EKG  EKG  Interpretation  Date/Time:  Wednesday September 12 2016 04:14:26 EST Ventricular Rate:  136 PR Interval:    QRS Duration: 99 QT Interval:  312 QTC Calculation: 470 R Axis:   -101 Text Interpretation:  Atrial fibrillation Left anterior fascicular block Consider anterior infarct  h/o of afib Confirmed by Mame Twombly MD, Diontre Harps [phone]) on 09/12/2016 6:06:15 AM       Radiology Dg Chest Port 1 View  Result Date: 09/12/2016 CLINICAL DATA:  Shortness of breath.  Code sepsis. EXAM: PORTABLE CHEST 1 VIEW COMPARISON:  Radiographs 08/09/2013 FINDINGS: The heart is enlarged. Unchanged tortuosity of the thoracic aorta. There is cephalization of pulmonary vasculature in peribronchial cuffing concerning for pulmonary edema. Small focal opacity in the left midlung zone. No large pleural effusion. No pneumothorax. Spinal stimulator and postsurgical change in the spine. Advanced degenerative change of both shoulders. IMPRESSION: 1. Patchy focal airspace opacity left midlung zone, concerning for pneumonia. 2. Cardiomegaly. Vascular congestion and probable pulmonary edema. Suspect an element of CHF. Electronically Signed   By: Jeb Levering M.D.   On: 09/12/2016 05:48    Procedures Procedures (including critical care time) CRITICAL CARE Performed by: Forde Dandy   Total critical care time: 40 minutes  Critical care time was exclusive of separately billable procedures and treating other patients.  Critical care was necessary to treat or prevent imminent or life-threatening deterioration.  Critical care was time spent personally by me on the following activities: development of treatment plan with patient and/or surrogate as well as nursing, discussions with consultants, evaluation of patient's response to treatment, examination of patient, obtaining history from patient or surrogate, ordering and performing treatments and interventions, ordering and review of laboratory studies, ordering and review of  radiographic studies, pulse oximetry and re-evaluation of patient's condition.  Medications Ordered in ED Medications  acetaminophen (TYLENOL) tablet 650 mg (650 mg Oral Given 09/12/16 0424)  sodium chloride 0.9 % bolus 1,000 mL (0 mLs Intravenous Stopped 09/12/16 0529)  vancomycin (VANCOCIN) IVPB 1000 mg/200 mL premix (0 mg Intravenous Stopped 09/12/16 0612)  piperacillin-tazobactam (ZOSYN) IVPB 3.375 g (0 g Intravenous Stopped 09/12/16 0538)  sodium chloride 0.9 % bolus 1,000 mL (0 mLs Intravenous Stopped  09/12/16 0530)     Initial Impression / Assessment and Plan / ED Course  I have reviewed the triage vital signs and the nursing notes.  Pertinent labs & imaging results that were available during my care of the patient were reviewed by me and considered in my medical decision making (see chart for details).  Clinical Course     Presenting with shortness of breath x 1 day, with presentation concerning for sepsis. Febrile, tachycardic w/ afib w/ RVR on monitor. With new oxygen requirement, on NRB to maintain normal oxygenation.  Sepsis work-up showing source of infection potential pneumonia on CXR visualization. Also with RLE cellulitis on exam that is potential source. UA clear. Blood w/ with lactate of 1.9 and mild leukocytosis of 11.   CXR also suggest element of CHF w/ interstitial edema, and she has elevated BNP 1000s. She received 1500 mL of IVF total (between EMS and ED), below her 30 cc/kg given her active CHF and requiring NRB to maintain oxygenation. She is DNR. Received vancomycin and zosyn for antibiotics.   Discussed with Dr. Blaine Hamper who will admit for ongoing management on stepdown.  Final Clinical Impressions(s) / ED Diagnoses   Final diagnoses:  Sepsis, due to unspecified organism [facility])  Cellulitis of right lower extremity  Lobar pneumonia ([place])  Acute respiratory failure with hypoxia [facility])    New Prescriptions New Prescriptions   No medications on file     Forde Dandy, MD 09/12/16 [phone]

## 2016-09-12 NOTE — Progress Notes (Signed)
This is a no charge note  Pending on admission per Dr. Oleta Mouse  82 year old lady with past medical history of hypertension, hyperlipidemia, thrombocytopenia, CAD, atrial fibrillation, dCHF, who was brought in from nursing home due to shortness of breath. Found to have left middle lobe infiltration, consistent with HCAP. WBC 11.2, lactate 1.4, temperature 102.5, tachycardia and tachypnea, oxygen saturation 84% on room air. Pt is accepted to SDU as inpt. IV vancomycin and Zosyn was started in ED.  Ivor Costa, MD  [facility] Pager [phone]  If 7PM-7AM, please contact night-coverage [email or web address] Password [facility] 09/12/2016, 6:33 AM

## 2016-09-12 NOTE — ED Notes (Signed)
Dr. Marily Memos made aware of post-void residual, reports he wants an foley catheter inserted.

## 2016-09-12 NOTE — ED Notes (Signed)
Dr. Oleta Mouse discontinued NS IV bolus .

## 2016-09-12 NOTE — ED Notes (Signed)
Pt off bedpan; voided 218m; post-void bladder residual 3166m

## 2016-09-13 ENCOUNTER — Inpatient Hospital Stay (HOSPITAL_COMMUNITY): Payer: Medicare Other

## 2016-09-13 LAB — CBC
HCT: 37.6 % (ref 36.0–46.0)
Hemoglobin: 11.5 g/dL — ABNORMAL LOW (ref 12.0–15.0)
MCH: 27.6 pg (ref 26.0–34.0)
MCHC: 30.6 g/dL (ref 30.0–36.0)
MCV: 90.4 fL (ref 78.0–100.0)
PLATELETS: 73 10*3/uL — AB (ref 150–400)
RBC: 4.16 MIL/uL (ref 3.87–5.11)
RDW: 15.7 % — ABNORMAL HIGH (ref 11.5–15.5)
WBC: 7.8 10*3/uL (ref 4.0–10.5)

## 2016-09-13 LAB — COMPREHENSIVE METABOLIC PANEL
ALK PHOS: 63 U/L (ref 38–126)
ALT: 6 U/L — AB (ref 14–54)
AST: 10 U/L — ABNORMAL LOW (ref 15–41)
Albumin: 2 g/dL — ABNORMAL LOW (ref 3.5–5.0)
Anion gap: 10 (ref 5–15)
BILIRUBIN TOTAL: 0.8 mg/dL (ref 0.3–1.2)
BUN: 32 mg/dL — ABNORMAL HIGH (ref 6–20)
CALCIUM: 7.8 mg/dL — AB (ref 8.9–10.3)
CO2: 23 mmol/L (ref 22–32)
CREATININE: 1.23 mg/dL — AB (ref 0.44–1.00)
Chloride: 102 mmol/L (ref 101–111)
GFR, EST AFRICAN AMERICAN: 46 mL/min — AB (ref >60)
GFR, EST NON AFRICAN AMERICAN: 39 mL/min — AB (ref >60)
Glucose, Bld: 121 mg/dL — ABNORMAL HIGH (ref 65–99)
Potassium: 5.2 mmol/L — ABNORMAL HIGH (ref 3.5–5.1)
Sodium: 135 mmol/L (ref 135–145)
Total Protein: 6.1 g/dL — ABNORMAL LOW (ref 6.5–8.1)

## 2016-09-13 LAB — LEGIONELLA PNEUMOPHILA SEROGP 1 UR AG: L. pneumophila Serogp 1 Ur Ag: NEGATIVE

## 2016-09-13 LAB — ECHOCARDIOGRAM COMPLETE
Height: 62 in
WEIGHTICAEL: 3456.81 [oz_av]

## 2016-09-13 MED ORDER — PERFLUTREN LIPID MICROSPHERE
INTRAVENOUS | Status: AC
  Filled 2016-09-13: qty 10

## 2016-09-13 MED ORDER — CLOPIDOGREL BISULFATE 75 MG PO TABS
75.0000 mg | ORAL_TABLET | Freq: Every day | ORAL | Status: DC
Start: 2016-09-13 — End: 2016-09-20
  Administered 2016-09-13 – 2016-09-20 (×8): 75 mg via ORAL
  Filled 2016-09-13 (×8): qty 1

## 2016-09-13 MED ORDER — LIDOCAINE HCL (PF) 1 % IJ SOLN
30.0000 mL | Freq: Once | INTRAMUSCULAR | Status: DC
  Filled 2016-09-13: qty 30

## 2016-09-13 MED ORDER — MORPHINE SULFATE ER 15 MG PO TBCR
15.0000 mg | EXTENDED_RELEASE_TABLET | Freq: Two times a day (BID) | ORAL | Status: DC
  Administered 2016-09-13 – 2016-09-14 (×3): 15 mg via ORAL
  Filled 2016-09-13 (×3): qty 1

## 2016-09-13 MED ORDER — POLYVINYL ALCOHOL 1.4 % OP SOLN
1.0000 [drp] | Freq: Two times a day (BID) | OPHTHALMIC | Status: DC
  Administered 2016-09-13 – 2016-09-20 (×14): 1 [drp] via OPHTHALMIC
  Filled 2016-09-13: qty 15

## 2016-09-13 MED ORDER — DILTIAZEM HCL 100 MG IV SOLR
5.0000 mg/h | INTRAVENOUS | Status: DC
  Administered 2016-09-13 (×2): 10 mg/h via INTRAVENOUS
  Filled 2016-09-13 (×3): qty 100

## 2016-09-13 MED ORDER — SODIUM CHLORIDE 0.9 % IV BOLUS (SEPSIS)
250.0000 mL | Freq: Once | INTRAVENOUS | Status: AC
  Administered 2016-09-13: 250 mL via INTRAVENOUS

## 2016-09-13 MED ORDER — PERFLUTREN LIPID MICROSPHERE
1.0000 mL | INTRAVENOUS | Status: AC | PRN
  Administered 2016-09-13: 2 mL via INTRAVENOUS
  Filled 2016-09-13: qty 10

## 2016-09-13 MED ORDER — CHLORHEXIDINE GLUCONATE CLOTH 2 % EX PADS
6.0000 | MEDICATED_PAD | Freq: Every day | CUTANEOUS | Status: AC
  Administered 2016-09-13 – 2016-09-17 (×5): 6 via TOPICAL

## 2016-09-13 MED ORDER — WHITE PETROLATUM GEL
Status: AC
  Administered 2016-09-13: 1
  Filled 2016-09-13: qty 1

## 2016-09-13 MED ORDER — POLYETHYL GLYCOL-PROPYL GLYCOL 0.4-0.3 % OP GEL
Freq: Two times a day (BID) | OPHTHALMIC | Status: DC
  Filled 2016-09-13: qty 10

## 2016-09-13 MED ORDER — DIGOXIN 125 MCG PO TABS
0.0625 mg | ORAL_TABLET | Freq: Every day | ORAL | Status: DC
  Administered 2016-09-14 – 2016-09-20 (×7): 0.0625 mg via ORAL
  Filled 2016-09-13 (×7): qty 1

## 2016-09-13 MED ORDER — MORPHINE SULFATE ER 15 MG PO TBCR: EXTENDED_RELEASE_TABLET | Freq: Two times a day (BID) | ORAL | Status: DC

## 2016-09-13 MED ORDER — DIGOXIN 0.25 MG/ML IJ SOLN
0.2500 mg | Freq: Once | INTRAMUSCULAR | Status: AC
  Administered 2016-09-13: 0.25 mg via INTRAVENOUS
  Filled 2016-09-13: qty 2

## 2016-09-13 MED ORDER — DIGOXIN 125 MCG PO TABS
0.0625 mg | ORAL_TABLET | Freq: Three times a day (TID) | ORAL | Status: AC
  Administered 2016-09-13 (×3): 0.0625 mg via ORAL
  Filled 2016-09-13 (×3): qty 1

## 2016-09-13 MED ORDER — MUPIROCIN 2 % EX OINT
1.0000 "application " | TOPICAL_OINTMENT | Freq: Two times a day (BID) | CUTANEOUS | Status: AC
  Administered 2016-09-13 – 2016-09-17 (×10): 1 via NASAL
  Filled 2016-09-13 (×2): qty 22

## 2016-09-13 MED ORDER — SALINE SPRAY 0.65 % NA SOLN
1.0000 | Freq: Three times a day (TID) | NASAL | Status: DC
  Administered 2016-09-13 – 2016-09-20 (×20): 1 via NASAL
  Filled 2016-09-13: qty 44

## 2016-09-13 MED ORDER — HYPROMELLOSE (GONIOSCOPIC) 2.5 % OP SOLN
1.0000 [drp] | Freq: Two times a day (BID) | OPHTHALMIC | Status: DC
  Filled 2016-09-13 (×2): qty 15

## 2016-09-13 NOTE — Progress Notes (Signed)
  Echocardiogram 2D Echocardiogram with Definity has been performed.  [name] [name] 09/13/2016, 11:59 AM

## 2016-09-13 NOTE — Progress Notes (Signed)
Orthopedic Tech Progress Note Patient Details:  [name] [name] Feb 28, 1931 [id number]  Ortho Devices Type of Ortho Device: Unna boot Ortho Device/Splint Location: bilateral Ortho Device/Splint Interventions: Application   Mozes Sagar 09/13/2016, 10:12 AM

## 2016-09-13 NOTE — Progress Notes (Signed)
MEDICATION RELATED NOTE    Pharmacy Re:  Warfarin  Assessment: Asked to review home meds and evaluate for Warfarin therapy.  It appears that she had been on Warfarin in the past but this was discontinued in the SNF that she was residing in.  On 11/17 a note was completed by Laverna Peace a nurse practitioner stating she was no longer taking this due to thrombocytopenia with platelet counts < 25.  She stated that she was taking Clopidogrel at that time.  Plan:  Review risk/benefit and resume Warfarin is you feel needed.  Rober Minion, PharmD., MS Clinical Pharmacist Pager:  [phone] Thank you for allowing pharmacy to be part of this patients care team. 09/13/2016,9:04 AM

## 2016-09-13 NOTE — Progress Notes (Signed)
New orders received IV digoxin 0.25 mg IV and 250 cc normal saline bolus.

## 2016-09-13 NOTE — Progress Notes (Signed)
[facility] Progress Note     Subjective: Pt without new complaints. Pt states legs were recently wrapped.   Objective: Vital signs in last 24 hours: Temp:  [96.4 F (35.8 C)-98.5 F (36.9 C)] 97.5 F (36.4 C) (12/28 0832) Pulse Rate:  [61-139] 123 (12/28 0832) Resp:  [14-28] 23 (12/28 0832) BP: (91-125)/(56-98) 107/81 (12/28 0832) SpO2:  [87 %-99 %] 92 % (12/28 0832) Weight:  [214 lb 6.4 oz (97.3 kg)-216 lb 0.8 oz (98 kg)] 216 lb 0.8 oz (98 kg) (12/28 0400) Last BM Date: 09/10/16  Intake/Output from previous day: 12/27 0701 - 12/28 0700 In: 2093.3 [I.V.:2043.3; IV Piggyback:50] Out: 975 [Urine:975] Intake/Output this shift: Total I/O In: 100 [I.V.:100] Out: -   PE: Gen:  Alert, NAD, pleasant, cooperative, elderly woman lying in bed Pulm:  effort normal Skin: no rashes noted, warm and dry Extremities: BLE with wraps applied  Lab Results:   Recent Labs  09/12/16 0417 09/13/16 0219  WBC 11.2* 7.8  HGB 11.1* 11.5*  HCT 37.0 37.6  PLT 111* 73*   BMET  Recent Labs  09/12/16 0417 09/13/16 0219  NA 139 135  K 4.3 5.2*  CL 101 102  CO2 31 23  GLUCOSE 128* 121*  BUN 31* 32*  CREATININE 1.33* 1.23*  CALCIUM 7.9* 7.8*   PT/INR  Recent Labs  09/12/16 1206  LABPROT 18.0*  INR 1.47   CMP     Component Value Date/Time   NA 135 09/13/2016 0219   NA 144 08/29/2016 1442   K 5.2 (H) 09/13/2016 0219   K 3.8 08/29/2016 1442   CL 102 09/13/2016 0219   CL 96 (L) 08/29/2016 1442   CO2 23 09/13/2016 0219   CO2 29 08/29/2016 1442   GLUCOSE 121 (H) 09/13/2016 0219   GLUCOSE 174 (H) 08/29/2016 1442   BUN 32 (H) 09/13/2016 0219   BUN 44 (H) 08/29/2016 1442   CREATININE 1.23 (H) 09/13/2016 0219   CREATININE 2.0 (H) 08/29/2016 1442   CALCIUM 7.8 (L) 09/13/2016 0219   CALCIUM 9.4 08/29/2016 1442   PROT 6.1 (L) 09/13/2016 0219   PROT 7.1 08/29/2016 1442   ALBUMIN 2.0 (L) 09/13/2016 0219   ALBUMIN 3.4 08/29/2016 1442   AST 10 (L) 09/13/2016 0219    AST 15 08/29/2016 1442   ALT 6 (L) 09/13/2016 0219   ALT 11 08/29/2016 1442   ALKPHOS 63 09/13/2016 0219   ALKPHOS 80 08/29/2016 1442   BILITOT 0.8 09/13/2016 0219   BILITOT 0.80 08/29/2016 1442   GFRNONAA 39 (L) 09/13/2016 0219   GFRAA 46 (L) 09/13/2016 0219   Lipase     Component Value Date/Time   LIPASE 12 12/20/2012 1706       Studies/Results: Dg Chest Port 1 View  Result Date: 09/13/2016 CLINICAL DATA:  82 year old female with shortness of breath, sepsis. Initial encounter. EXAM: PORTABLE CHEST 1 VIEW COMPARISON:  09/12/2016 and earlier. FINDINGS: Portable AP semi upright view at 0644 hours. Stable cardiac size and mediastinal contours. Mid thoracic spinal stimulator device re- demonstrated. Partially visible lumbar fusion hardware. Stable lung volumes. Mild to moderate coarse and streaky bilateral perihilar pulmonary opacity. As before this is most confluent about the left hilum. No pneumothorax or pleural effusion. Ventilation has mildly improved since yesterday. IMPRESSION: 1. Improved ventilation since yesterday with residual coarse interstitial and streaky pulmonary opacity. Favor acute infection superimposed on atelectasis and vascular congestion. 2. No pleural effusion or new cardiopulmonary abnormality. Electronically Signed   By: Genevie Ann  M.D.   On: 09/13/2016 08:02   Dg Chest Port 1 View  Result Date: 09/12/2016 CLINICAL DATA:  Shortness of Breath EXAM: PORTABLE CHEST 1 VIEW COMPARISON:  Study obtained earlier in the day FINDINGS: There is generalized interstitial pulmonary edema. Patchy airspace opacity in the left mid lung is stable. No new opacity is evident. No evident pneumothorax. There is cardiomegaly with pulmonary venous hypertension. No evident adenopathy. Stimulator is present with tip in mid thoracic region. There is postoperative change in the lumbar region. IMPRESSION: Evidence fluid with a degree of congestive heart failure with questionable superimposed  pneumonia left mid lung, stable. There is cardiomegaly with pulmonary venous hypertension. The overall appearance is stable compared to earlier in the day. Electronically Signed   By: Lowella Grip III M.D.   On: 09/12/2016 12:41   Dg Chest Port 1 View  Result Date: 09/12/2016 CLINICAL DATA:  Shortness of breath.  Code sepsis. EXAM: PORTABLE CHEST 1 VIEW COMPARISON:  Radiographs 08/09/2013 FINDINGS: The heart is enlarged. Unchanged tortuosity of the thoracic aorta. There is cephalization of pulmonary vasculature in peribronchial cuffing concerning for pulmonary edema. Small focal opacity in the left midlung zone. No large pleural effusion. No pneumothorax. Spinal stimulator and postsurgical change in the spine. Advanced degenerative change of both shoulders. IMPRESSION: 1. Patchy focal airspace opacity left midlung zone, concerning for pneumonia. 2. Cardiomegaly. Vascular congestion and probable pulmonary edema. Suspect an element of CHF. Electronically Signed   By: Jeb Levering M.D.   On: 09/12/2016 05:48    Anti-infectives: Anti-infectives    Start     Dose/Rate Route Frequency Ordered Stop   09/13/16 0500  vancomycin (VANCOCIN) 1,250 mg in sodium chloride 0.9 % 250 mL IVPB     1,250 mg 166.7 mL/hr over 90 Minutes Intravenous Every 24 hours 09/12/16 0942     09/12/16 1230  ceFEPIme (MAXIPIME) 2 g in dextrose 5 % 50 mL IVPB     2 g 100 mL/hr over 30 Minutes Intravenous Every 24 hours 09/12/16 0942     09/12/16 1000  valACYclovir (VALTREX) tablet 1,000 mg     1,000 mg Oral Daily 09/12/16 0809     09/12/16 0500  vancomycin (VANCOCIN) IVPB 1000 mg/200 mL premix     1,000 mg 200 mL/hr over 60 Minutes Intravenous  Once 09/12/16 0457 09/12/16 0612   09/12/16 0500  piperacillin-tazobactam (ZOSYN) IVPB 3.375 g     3.375 g 100 mL/hr over 30 Minutes Intravenous  Once 09/12/16 0457 09/12/16 0538       Assessment/Plan  Sepsis likely due to respiratory source, in the setting of LLE  cellulitis, organism unknown    - IV antibiotics: Vanco and Cefepime  Acute Respiratory Failure with Hypoxia likely due to HCAP and CHF exacerbation - sputum cultures Chronic diastolic heart failure with possible exacerbation Chronic kidney disease stage  Hypertension Atrial Fibrillation not on anticoagulation due to ITP 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 No deep necrosis or evidence of ischemia. Likely 2/2 chronic venous insufficiency. Will do bedside debridement of necrotic tissue when the una boots are changed. If pt is discharged prior to boot change she can follow up in our office for debridement. Dressing changes and una boots for compression.    LOS: 1 day    Kalman Drape , [facility] 09/13/2016, 10:03 AM Pager: [phone] Consults: [phone] Mon-Fri 7:00 am-4:30 pm Sat-Sun 7:00 am-11:30 am

## 2016-09-13 NOTE — Progress Notes (Signed)
PROGRESS NOTE    [name] [name]  TRV:[id number] DOB: [birth year] DOA: 09/12/2016 PCP: Mathews Argyle, MD    Brief Narrative:  82 y/o ? SNF resident afib CHad2Vasc2 score=6, on coumadin TCP 2/2 to ITP  Followed previously by Dr. Marin Olp  Re-established care and started back on Prednisone 60 ~07/2016 CKD stg III DJD s/p Lumbar disk surgery 2005  neurostim 2008 STEMI 09/2012 s/p DES Visual concerns and rx Exotropia 01/2011 HLD HTN PVD with severe venous insuff Cholecystitis and cholangitis ion 04/6167 Chr Diastolic HF-EF 37% in 2902 Prior L Hallux eschar/hematoma--Onychomycoosis in the past folled by Podiatry Dr. Amalia Hailey  Presented form SNF with Sepsis 2/2 to either HCAP or superimposed cellulitis on her LE acute respiratory failure  Admitted to SDU Gen surgery consutled  Assessment & Plan:   Active Problems:   HTN (hypertension)   Dyslipidemia   Chronic back pain   Thrombocytopenia (HCC)   CAD (coronary artery disease) of artery bypass graft   Chronic narcotic dependence (HCC)   Sepsis ([place])   Cellulitis of leg   Atrial fibrillation with RVR (HCC)   Leukocytosis   Chronic diastolic heart failure ([place])   HCAP (healthcare-associated pneumonia)   Severe Sepsis in a setting of immunocompromises [on steroids]-PNA vs Cellulitis -rpt CXR -appreciate gen surgery input/WOC nurse input.  Cont UNNA boot changes q thrus/Monday -Cont Vanc/Cefepime -blood pressures low normal limiting rate ctrll -cont Solu-cortef 50 IV q6 -cont Valacyclovir 1000 qd for shingles -Chest x-ray repeat was 12/28 confirms evidence of pneumonia therefore continue IV fluids at a lower rate of 50 cc per hour and broad spectrum antibiotics -Cycle lactic acid and pro-calcitonin  Acute hypoxic resp failure -Was on Face mask overnght -changed to Nasal canula -monitor sats  Afib with RVR, CHad2Vasc2 score=6 -Loaded with Digoxin 12/28 -give PO Digoxin 0.125 again q8 today and might need to put  on maintenance -place on Cardizem Gtt to allow for better control and d/c Cardizem 240 -Cont metoprolol 100 bid -consult cardiology if unable to control or hypotension-will reassess later -will need systemic AC probably with   Possible AECHF/Diastolic-prior EF 60 % 11155 -see above discussion-holding Torsemide 80 daily for now -review CXR as above  Chr pain with Lumbar surgery and prior Spinal stimulator -resume Morphine sulphate  ER 15 bid -hold OXY IR for now  TCP 2/2 to ITP -monitor daily counts. -cont stress dosing of steroids  Prior CAD STEMI 09/2012 s/p DES -resume plavix 75 daily  Hypokalemia -Iatrogenic and discontinued potassium chloride supplementation  Urinary retention -foley placed 12/27 -will need voiding trial when able to be up OOB  Prior L Hallux hematoma foll Dr. Amalia Hailey  HLD     DVT prophylaxis: Lovenox Code Status: Full Family Communication: Attempted to  update son on phone 09/13/16-no answer Disposition Plan: SDU   Consultants:   Gen surgery  Procedures:    none yet  Antimicrobials:   Vanc 12/28  Cefepime 12/28    Subjective:  Alert  Moaning Seems to be in pain   Objective: Vitals:   09/12/16 2100 09/12/16 2300 09/13/16 0400 09/13/16 0614  BP: 118/90 107/69 (!) 96/56 91/79  Pulse:  61 (!) 115 (!) 118  Resp:  (!) 26 19 (!) 28  Temp: 97.5 F (36.4 C) (!) 96.4 F (35.8 C) 97.7 F (36.5 C)   TempSrc: Oral Axillary Axillary   SpO2: 91% 97% 98% 99%  Weight:   98 kg (216 lb 0.8 oz)   Height:  Intake/Output Summary (Last 24 hours) at 09/13/16 0751 Last data filed at 09/13/16 0438  Gross per 24 hour  Intake           893.33 ml  Output              975 ml  Net           -81.67 ml   Filed Weights   09/12/16 1851 09/13/16 0400  Weight: 97.3 kg (214 lb 6.4 oz) 98 kg (216 lb 0.8 oz)    Examination:  General exam: moaning  Respiratory system: Clear to auscultation laterally. Respiratory effort  normal. Cardiovascular system: S1 & S2 heard, RRR. No JVD, murmurs.  Grade 2-3 LE edema Gastrointestinal system: Abdomen is nondistended, soft and nontender. No organomegaly Central nervous system: Alert and oriented. No focal neurological deficits. Extremities: Symmetric 5 x 5 power. Skin:Swollen lower extremities-upper calves wrapped in gauze and not examined today Psychiatry: Judgement and insight appear normal. Mood & affect appropriate.     Data Reviewed: I have personally reviewed following labs and imaging studies  CBC:  Recent Labs Lab 09/12/16 0417 09/13/16 0219  WBC 11.2* 7.8  HGB 11.1* 11.5*  HCT 37.0 37.6  MCV 91.4 90.4  PLT 111* 73*   Basic Metabolic Panel:  Recent Labs Lab 09/12/16 0417 09/13/16 0219  NA 139 135  K 4.3 5.2*  CL 101 102  CO2 31 23  GLUCOSE 128* 121*  BUN 31* 32*  CREATININE 1.33* 1.23*  CALCIUM 7.9* 7.8*   GFR: Estimated Creatinine Clearance: 37.9 mL/min (by C-G formula based on SCr of 1.23 mg/dL (H)). Liver Function Tests:  Recent Labs Lab 09/13/16 0219  AST 10*  ALT 6*  ALKPHOS 63  BILITOT 0.8  PROT 6.1*  ALBUMIN 2.0*   No results for input(s): LIPASE, AMYLASE in the last 168 hours. No results for input(s): AMMONIA in the last 168 hours. Coagulation Profile:  Recent Labs Lab 09/12/16 1206  INR 1.47   Cardiac Enzymes: No results for input(s): CKTOTAL, CKMB, CKMBINDEX, TROPONINI in the last 168 hours. BNP (last 3 results) No results for input(s): PROBNP in the last 8760 hours. HbA1C: No results for input(s): HGBA1C in the last 72 hours. CBG: No results for input(s): GLUCAP in the last 168 hours. Lipid Profile: No results for input(s): CHOL, HDL, LDLCALC, TRIG, CHOLHDL, LDLDIRECT in the last 72 hours. Thyroid Function Tests: No results for input(s): TSH, T4TOTAL, FREET4, T3FREE, THYROIDAB in the last 72 hours. Anemia Panel: No results for input(s): VITAMINB12, FOLATE, FERRITIN, TIBC, IRON, RETICCTPCT in the last  72 hours. Sepsis Labs:  Recent Labs Lab 09/12/16 0502 09/12/16 0809 09/12/16 0815 09/12/16 1206  PROCALCITON  --  0.80  --   --   LATICACIDVEN 1.94*  --  1.19 1.5    Recent Results (from the past 240 hour(s))  MRSA PCR Screening     Status: Abnormal   Collection Time: 09/12/16  6:59 PM  Result Value Ref Range Status   MRSA by PCR POSITIVE (A) NEGATIVE Final    Comment:        The GeneXpert MRSA Assay (FDA approved for NASAL specimens only), is one component of a comprehensive MRSA colonization surveillance program. It is not intended to diagnose MRSA infection nor to guide or monitor treatment for MRSA infections. RESULT CALLED TO, READ BACK BY AND VERIFIED WITH: M.WHITE,RN AT 2345 BY L.PITT 09/12/16          Radiology Studies: Dg Chest [facility] [address]  Result Date: 09/12/2016 CLINICAL DATA:  Shortness of Breath EXAM: PORTABLE CHEST 1 VIEW COMPARISON:  Study obtained earlier in the day FINDINGS: There is generalized interstitial pulmonary edema. Patchy airspace opacity in the left mid lung is stable. No new opacity is evident. No evident pneumothorax. There is cardiomegaly with pulmonary venous hypertension. No evident adenopathy. Stimulator is present with tip in mid thoracic region. There is postoperative change in the lumbar region. IMPRESSION: Evidence fluid with a degree of congestive heart failure with questionable superimposed pneumonia left mid lung, stable. There is cardiomegaly with pulmonary venous hypertension. The overall appearance is stable compared to earlier in the day. Electronically Signed   By: Lowella Grip III M.D.   On: 09/12/2016 12:41   Dg Chest Port 1 View  Result Date: 09/12/2016 CLINICAL DATA:  Shortness of breath.  Code sepsis. EXAM: PORTABLE CHEST 1 VIEW COMPARISON:  Radiographs 08/09/2013 FINDINGS: The heart is enlarged. Unchanged tortuosity of the thoracic aorta. There is cephalization of pulmonary vasculature in peribronchial cuffing  concerning for pulmonary edema. Small focal opacity in the left midlung zone. No large pleural effusion. No pneumothorax. Spinal stimulator and postsurgical change in the spine. Advanced degenerative change of both shoulders. IMPRESSION: 1. Patchy focal airspace opacity left midlung zone, concerning for pneumonia. 2. Cardiomegaly. Vascular congestion and probable pulmonary edema. Suspect an element of CHF. Electronically Signed   By: Jeb Levering M.D.   On: 09/12/2016 05:48        Scheduled Meds: . ceFEPime (MAXIPIME) IV  2 g Intravenous Q24H  . Chlorhexidine Gluconate Cloth  6 each Topical Q0600  . gabapentin  600 mg Oral TID  . hydrocerin   Topical BID  . hydrocortisone sod succinate (SOLU-CORTEF) inj  50 mg Intravenous Q6H  . metoprolol  100 mg Oral BID  . mupirocin ointment  1 application Nasal BID  . oxyCODONE  5 mg Oral Q4H  . pantoprazole  40 mg Oral Daily  . potassium chloride SA  20 mEq Oral BID  . valACYclovir  1,000 mg Oral Daily  . vancomycin  1,250 mg Intravenous Q24H   Continuous Infusions: . sodium chloride 100 mL/hr at 09/13/16 0432     LOS: 1 day    Time spent: [place], [place], MD [facility] Pager [phone]  If 7PM-7AM, please contact night-coverage [email or web address] Password [facility] 09/13/2016, 7:51 AM

## 2016-09-13 NOTE — Progress Notes (Signed)
Patient heart rate 120-135 atrial fibrillation sustaining blood pressure 91/79.Text paged Chaney Malling NP awaiting response.

## 2016-09-13 NOTE — Consult Note (Signed)
WOC follow-up: WOC consult was performed yesterday; refer to consult note on 12/27 for assessment and measurements.  Surgical team was consulted for possible debridement of nonviable tissue to right leg and they have ordered Una boots.  Ortho tech paged to apply [employer] and [place] and change Q Thurs and Mon.  EMR indicates that surgical team will plan to follow for further plan of care. Please re-consult if further assistance is needed.  Thank-you,  Julien Girt MSN, [place], [place], [place], [place]

## 2016-09-13 NOTE — NC FL2 (Signed)
[place] MEDICAID FL2 LEVEL OF CARE SCREENING TOOL     IDENTIFICATION  Patient Name: [name] [name] Birthdate: 12/12/1930 Sex: female Admission Date (Current Location): 09/12/2016  [facility] [place] and [place] Number:  Herbalist and Address:  [facility], [place] [address] [place], [place] [address]      Provider Number: [id number]  Attending Physician Name and Address:  Nita Sells, MD  Relative Name and Phone Number:       Current Level of Care: Hospital Recommended Level of Care: [place] Prior Approval Number:    Date Approved/Denied:   PASRR Number:    Discharge Plan: SNF    Current Diagnoses: Patient Active Problem List   Diagnosis Date Noted  . HCAP (healthcare-associated pneumonia) 09/12/2016  . Gangrene ([place])   . Acute respiratory failure with hypoxia ([place])   . Coronary atherosclerosis of native coronary artery 10/09/2013  . Atrial fibrillation ([place]) 10/09/2013  . Chronic diastolic heart failure ([place]) 10/09/2013  . Leukocytosis 08/11/2013  . Sepsis ([place]) 08/10/2013  . Cellulitis of leg 08/10/2013  . Atrial fibrillation with RVR ([place]) 08/10/2013  . Cellulitis of left lower extremity 07/16/2013  . Chronic narcotic dependence ([place]) 07/16/2013  . Varicose veins of lower extremities with other complications 24/40/1027  . Edema 05/14/2013  . Bacteremia 12/26/2012  . Severe sepsis ([place]) 12/26/2012  . Cholecystitis, acute 12/20/2012  . Hypokalemia 12/20/2012  . CAD (coronary artery disease) of artery bypass graft 12/18/2012  . Unstable angina ([place]) 10/18/2012  . Thrombocytopenia ([place]) 10/18/2012  . Chest pain 10/17/2012  . HTN (hypertension) 10/17/2012  . Dyslipidemia 10/17/2012  . Chronic back pain 10/17/2012  . Precordial pain 10/17/2012    Orientation RESPIRATION BLADDER Height & Weight     Self, Situation, Place  Normal Continent Weight: 216 lb 0.8 oz (98 kg) Height:  _0  (157.5 cm)   BEHAVIORAL SYMPTOMS/MOOD NEUROLOGICAL BOWEL NUTRITION STATUS      Continent Diet  AMBULATORY STATUS COMMUNICATION OF NEEDS Skin   Extensive Assist Verbally Normal                       Personal Care Assistance Level of Assistance  Bathing, Dressing Bathing Assistance: Maximum assistance   Dressing Assistance: Maximum assistance     Functional Limitations Info             SPECIAL CARE FACTORS FREQUENCY  OT (By licensed OT), PT (By licensed PT)     PT Frequency: 5/wk OT Frequency: 5/wk            Contractures      Additional Factors Info  Code Status, Allergies, Insulin Sliding Scale, Isolation Precautions Code Status Info: DNR Allergies Info: Keflex Cephalexin     Isolation Precautions Info: MRSA     Current Medications (09/13/2016):  This is the current hospital active medication list Current Facility-Administered Medications  Medication Dose Route Frequency Provider Last Rate Last Dose  . 0.9 %  sodium chloride infusion   Intravenous Continuous Nita Sells, MD 50 mL/hr at 09/13/16 1405    . albuterol (PROVENTIL) (2.5 MG/3ML) 0.083% nebulizer solution 2.5 mg  2.5 mg Nebulization Q4H PRN Rondel Jumbo, PA-C      . ceFEPIme (MAXIPIME) 2 g in dextrose 5 % 50 mL IVPB  2 g Intravenous Q24H Karren Cobble, RPH 100 mL/hr at 09/13/16 1434 2 g at 09/13/16 1434  . Chlorhexidine Gluconate Cloth 2 % PADS 6 each  6 each  Topical Q0600 Waldemar Dickens, MD   6 each at 09/13/16 [phone]  . clopidogrel (PLAVIX) tablet 75 mg  75 mg Oral Q breakfast Nita Sells, MD   75 mg at 09/13/16 1051  . digoxin (LANOXIN) tablet 0.0625 mg  0.0625 mg Oral Q8H Jai-Gurmukh Samtani, MD   0.0625 mg at 09/13/16 1434  . diltiazem (CARDIZEM) 100 mg in dextrose 5 % 100 mL (1 mg/mL) infusion  5-15 mg/hr Intravenous Titrated Nita Sells, MD 10 mL/hr at 09/13/16 1050 10 mg/hr at 09/13/16 1050  . gabapentin (NEURONTIN) capsule 600 mg  600 mg Oral TID Rondel Jumbo, PA-C   600  mg at 09/13/16 1053  . guaiFENesin (MUCINEX) 12 hr tablet 600 mg  600 mg Oral BID PRN Rondel Jumbo, PA-C      . hydrocerin (EUCERIN) cream   Topical BID Waldemar Dickens, MD   1 application at 02/02/32 1055  . hydrocortisone sodium succinate (SOLU-CORTEF) 100 MG injection 50 mg  50 mg Intravenous Q6H Rondel Jumbo, PA-C   50 mg at 09/13/16 1406  . lidocaine (PF) (XYLOCAINE) 1 % injection 30 mL  30 mL Intradermal Once Kalman Drape, PA      . metoprolol tartrate (LOPRESSOR) tablet 100 mg  100 mg Oral BID Rondel Jumbo, PA-C   100 mg at 09/13/16 1051  . morphine (MS CONTIN) 12 hr tablet 15 mg  15 mg Oral BID Nita Sells, MD   15 mg at 09/13/16 1435  . mupirocin ointment (BACTROBAN) 2 % 1 application  1 application Nasal BID Waldemar Dickens, MD   1 application at 58/25/18 1054  . pantoprazole (PROTONIX) EC tablet 40 mg  40 mg Oral Daily Waldemar Dickens, MD   40 mg at 09/13/16 1050  . PERFLUTREN LIPID MICROSPHERE injection SUSP           . polyvinyl alcohol (LIQUIFILM TEARS) 1.4 % ophthalmic solution 1 drop  1 drop Both Eyes BID Nita Sells, MD      . sodium chloride (OCEAN) 0.65 % nasal spray 1 spray  1 spray Each Nare TID Nita Sells, MD   1 spray at 09/13/16 1408  . valACYclovir (VALTREX) tablet 1,000 mg  1,000 mg Oral Daily Rondel Jumbo, PA-C   1,000 mg at 09/13/16 1404  . vancomycin (VANCOCIN) 1,250 mg in sodium chloride 0.9 % 250 mL IVPB  1,250 mg Intravenous Q24H Karren Cobble, RPH   1,250 mg at 09/13/16 9842     Discharge Medications: Please see discharge summary for a list of discharge medications.  Relevant Imaging Results:  Relevant Lab Results:   Additional Information    Jorge Ny, LCSW

## 2016-09-14 LAB — URINE CULTURE

## 2016-09-14 LAB — PROCALCITONIN: Procalcitonin: 0.36 ng/mL

## 2016-09-14 MED ORDER — DILTIAZEM HCL ER COATED BEADS 240 MG PO CP24
240.0000 mg | ORAL_CAPSULE | Freq: Every day | ORAL | Status: DC
  Administered 2016-09-14 – 2016-09-20 (×7): 240 mg via ORAL
  Filled 2016-09-14 (×7): qty 1

## 2016-09-14 MED ORDER — OXYCODONE HCL 5 MG PO TABS
5.0000 mg | ORAL_TABLET | Freq: Four times a day (QID) | ORAL | Status: DC | PRN
  Administered 2016-09-14 – 2016-09-17 (×8): 5 mg via ORAL
  Filled 2016-09-14 (×8): qty 1

## 2016-09-14 MED ORDER — OXYCODONE HCL 5 MG PO TABS
5.0000 mg | ORAL_TABLET | Freq: Once | ORAL | Status: AC
  Administered 2016-09-14: 5 mg via ORAL
  Filled 2016-09-14: qty 1

## 2016-09-14 MED ORDER — DILTIAZEM HCL 100 MG IV SOLR: 5.0000 mg/h | INTRAVENOUS | Status: DC

## 2016-09-14 NOTE — Progress Notes (Signed)
[facility] Progress Note     Subjective: Pt without new complaints. Sitting up in bed. Alert.  Objective: Vital signs in last 24 hours: Temp:  [96 F (35.6 C)-98.3 F (36.8 C)] 96 F (35.6 C) (12/29 0800) Pulse Rate:  [49-113] 77 (12/29 0800) Resp:  [16-24] 16 (12/29 0800) BP: (110-138)/(55-89) 124/76 (12/29 0800) SpO2:  [78 %-98 %] 98 % (12/29 0800) Weight:  [216 lb 4.3 oz (98.1 kg)] 216 lb 4.3 oz (98.1 kg) (12/29 0500) Last BM Date: 09/14/16  Intake/Output from previous day: 12/28 0701 - 12/29 0700 In: 1700.8 [I.V.:1700.8] Out: 1350 [Urine:1350] Intake/Output this shift: Total I/O In: 5 [I.V.:5] Out: -   PE: Gen:  Alert, NAD, pleasant, cooperative, elderly woman lying in bed Pulm:  effort normal Skin: no rashes noted, warm and dry Extremities: BLE with wraps applied, sensation intact of BLE, brisk cap refill bilaterally, good ROM of toes bilaterally  Lab Results:   Recent Labs  09/12/16 0417 09/13/16 0219  WBC 11.2* 7.8  HGB 11.1* 11.5*  HCT 37.0 37.6  PLT 111* 73*   BMET  Recent Labs  09/12/16 0417 09/13/16 0219  NA 139 135  K 4.3 5.2*  CL 101 102  CO2 31 23  GLUCOSE 128* 121*  BUN 31* 32*  CREATININE 1.33* 1.23*  CALCIUM 7.9* 7.8*   PT/INR  Recent Labs  09/12/16 1206  LABPROT 18.0*  INR 1.47   CMP     Component Value Date/Time   NA 135 09/13/2016 0219   NA 144 08/29/2016 1442   K 5.2 (H) 09/13/2016 0219   K 3.8 08/29/2016 1442   CL 102 09/13/2016 0219   CL 96 (L) 08/29/2016 1442   CO2 23 09/13/2016 0219   CO2 29 08/29/2016 1442   GLUCOSE 121 (H) 09/13/2016 0219   GLUCOSE 174 (H) 08/29/2016 1442   BUN 32 (H) 09/13/2016 0219   BUN 44 (H) 08/29/2016 1442   CREATININE 1.23 (H) 09/13/2016 0219   CREATININE 2.0 (H) 08/29/2016 1442   CALCIUM 7.8 (L) 09/13/2016 0219   CALCIUM 9.4 08/29/2016 1442   PROT 6.1 (L) 09/13/2016 0219   PROT 7.1 08/29/2016 1442   ALBUMIN 2.0 (L) 09/13/2016 0219   ALBUMIN 3.4 08/29/2016 1442    AST 10 (L) 09/13/2016 0219   AST 15 08/29/2016 1442   ALT 6 (L) 09/13/2016 0219   ALT 11 08/29/2016 1442   ALKPHOS 63 09/13/2016 0219   ALKPHOS 80 08/29/2016 1442   BILITOT 0.8 09/13/2016 0219   BILITOT 0.80 08/29/2016 1442   GFRNONAA 39 (L) 09/13/2016 0219   GFRAA 46 (L) 09/13/2016 0219   Lipase     Component Value Date/Time   LIPASE 12 12/20/2012 1706       Studies/Results: Dg Chest Port 1 View  Result Date: 09/13/2016 CLINICAL DATA:  82 year old female with shortness of breath, sepsis. Initial encounter. EXAM: PORTABLE CHEST 1 VIEW COMPARISON:  09/12/2016 and earlier. FINDINGS: Portable AP semi upright view at 0644 hours. Stable cardiac size and mediastinal contours. Mid thoracic spinal stimulator device re- demonstrated. Partially visible lumbar fusion hardware. Stable lung volumes. Mild to moderate coarse and streaky bilateral perihilar pulmonary opacity. As before this is most confluent about the left hilum. No pneumothorax or pleural effusion. Ventilation has mildly improved since yesterday. IMPRESSION: 1. Improved ventilation since yesterday with residual coarse interstitial and streaky pulmonary opacity. Favor acute infection superimposed on atelectasis and vascular congestion. 2. No pleural effusion or new cardiopulmonary abnormality. Electronically Signed  By: Genevie Ann M.D.   On: 09/13/2016 08:02   Dg Chest Port 1 View  Result Date: 09/12/2016 CLINICAL DATA:  Shortness of Breath EXAM: PORTABLE CHEST 1 VIEW COMPARISON:  Study obtained earlier in the day FINDINGS: There is generalized interstitial pulmonary edema. Patchy airspace opacity in the left mid lung is stable. No new opacity is evident. No evident pneumothorax. There is cardiomegaly with pulmonary venous hypertension. No evident adenopathy. Stimulator is present with tip in mid thoracic region. There is postoperative change in the lumbar region. IMPRESSION: Evidence fluid with a degree of congestive heart failure  with questionable superimposed pneumonia left mid lung, stable. There is cardiomegaly with pulmonary venous hypertension. The overall appearance is stable compared to earlier in the day. Electronically Signed   By: Lowella Grip III M.D.   On: 09/12/2016 12:41    Anti-infectives: Anti-infectives    Start     Dose/Rate Route Frequency Ordered Stop   09/13/16 0500  vancomycin (VANCOCIN) 1,250 mg in sodium chloride 0.9 % 250 mL IVPB     1,250 mg 166.7 mL/hr over 90 Minutes Intravenous Every 24 hours 09/12/16 0942     09/12/16 1230  ceFEPIme (MAXIPIME) 2 g in dextrose 5 % 50 mL IVPB     2 g 100 mL/hr over 30 Minutes Intravenous Every 24 hours 09/12/16 0942     09/12/16 1000  valACYclovir (VALTREX) tablet 1,000 mg     1,000 mg Oral Daily 09/12/16 0809     09/12/16 0500  vancomycin (VANCOCIN) IVPB 1000 mg/200 mL premix     1,000 mg 200 mL/hr over 60 Minutes Intravenous  Once 09/12/16 0457 09/12/16 0612   09/12/16 0500  piperacillin-tazobactam (ZOSYN) IVPB 3.375 g     3.375 g 100 mL/hr over 30 Minutes Intravenous  Once 09/12/16 0457 09/12/16 0538       Assessment/Plan  Severe Sepsis in a setting of immunocompromises [on steroids]-PNA vs Cellulitis -Cont UNNA boot changes q thrus/Monday -Cont Vanc/Cefepime -Chest x-ray repeat was 12/28 confirms evidence of pneumonia  Acute hypoxic resp failure Afib with RVR, CHad2Vasc2 score=6 Possible AECHF/Diastolic-prior EF 60 % 01749 Chr pain with Lumbar surgery and prior Spinal stimulator TCP 2/2 to ITP Prior CAD STEMI 09/2012 s/p DES Hypokalemia  BLE wounds: appear traumatic in origin. No deep necrosis or evidence of ischemia. Likely 2/2 chronic venous insufficiency. Will do bedside debridement of necrotic tissue when the UNNA boots are changed. If pt is discharged prior to boot change she can follow up in our office for debridement. UNNA boot changes q thurs/Mon. Dressing changes and una boots for compression.    LOS: 2 days    Kalman Drape , [facility] 09/14/2016, 9:59 AM Pager: [phone] Consults: [phone] Mon-Fri 7:00 am-4:30 pm Sat-Sun 7:00 am-11:30 am

## 2016-09-14 NOTE — Clinical Social Work Note (Signed)
Clinical Social Work Assessment  Patient Details  Name: [name] [name] MRN: [id number] Date of Birth: January 09, 1931  Date of referral:  09/14/16               Reason for consult:  Facility Placement                Permission sought to share information with:  Chartered certified accountant granted to share information::  Yes, Verbal Permission Granted  Name::     IT consultant::  [employer]  Relationship::  son  Contact Information:     Housing/Transportation Living arrangements for the past 2 months:  [place] of Information:  Patient Patient Interpreter Needed:  None Criminal Activity/Legal Involvement Pertinent to Current Situation/Hospitalization:  No - Comment as needed Significant Relationships:  Adult Children Lives with:  Facility Resident Do you feel safe going back to the place where you live?  No Need for family participation in patient care:     Care giving concerns:  None- pt is LTC resident at [facility] assessment / plan:  CSW spoke with patient about plan for time of DC.  Pt confirmed she is resident at [facility].  Employment status:  Retired Forensic scientist:  Medicare PT Recommendations:  Not assessed at this time Information / Referral to community resources:  [place]  Patient/Family's Response to care:  Pt agreeable to return to SNF at time of DC.  Patient/Family's Understanding of and Emotional Response to Diagnosis, Current Treatment, and Prognosis:  No questions or concerns at this time- hopeful she can be discharged soon.  Emotional Assessment Appearance:  Appears stated age Attitude/Demeanor/Rapport:  Lethargic Affect (typically observed):  Appropriate Orientation:  Oriented to Self, Oriented to Place, Oriented to  Time, Oriented to Situation Alcohol / Substance use:  Not Applicable Psych involvement (Current and /or in the community):  No (Comment)  Discharge Needs  Concerns to  be addressed:  Care Coordination Readmission within the last 30 days:  No Current discharge risk:  Physical Impairment Barriers to Discharge:  Continued Medical Work up   Jorge Ny, LCSW 09/14/2016, 4:11 PM

## 2016-09-14 NOTE — Progress Notes (Signed)
Pt teaching provided about the importance of attempting to complete basic adls independently -  To regain strength and not lose basic functioning  . Pt does not attempt to complete basic adls such as picking up a cup with a straw and holding it to her mouth - she refuses to hold cup on her own . Pt has been noted putting food and grabing at nasal cannula device .  She has overall functioning however refuses to assist herself when staff is present . Constant reinforcement is necessary for patient understanding

## 2016-09-14 NOTE — Progress Notes (Signed)
PROGRESS NOTE    [name] [name]  NLG:[id number] DOB: June 21, 1931 DOA: 09/12/2016 PCP: Mathews Argyle, MD    Brief Narrative:  82 y/o ? SNF resident afib CHad2Vasc2 score=6, on coumadin in the past but this was taken off of patient med list because of thrombocytopenia and is currently on Plavix(  TCP 2/2 to ITP  Followed previously by Dr. Marin Olp  Re-established care and started back on Prednisone 60 ~07/2016 CKD stg III DJD s/p Lumbar disk surgery 2005  neurostim 2008 STEMI 09/2012 s/p DES Visual concerns and rx Exotropia 01/2011 HLD HTN PVD with severe venous insuff Cholecystitis and cholangitis ion 0/8144 Chr Diastolic HF-EF 81% in 8563 Prior L Hallux eschar/hematoma--Onychomycoosis in the past folled by Podiatry Dr. Amalia Hailey  Presented form SNF with Sepsis 2/2 to either HCAP or superimposed cellulitis on her LE acute respiratory failure  Admitted to SDU Gen surgery consutled  Echo performed on admission found to be in rapid A. fib and needed to be loaded with digoxin on 12/28   Assessment & Plan:   Active Problems:   HTN (hypertension)   Dyslipidemia   Chronic back pain   Thrombocytopenia (HCC)   CAD (coronary artery disease) of artery bypass graft   Chronic narcotic dependence (HCC)   Sepsis ([place])   Cellulitis of leg   Atrial fibrillation with RVR (HCC)   Leukocytosis   Chronic diastolic heart failure ([place])   HCAP (healthcare-associated pneumonia)   Severe Sepsis-PNA +/- Cellulitis--clinically patient has both sources -appreciate gen surgery input/WOC nurse input.  Cont UNNA boot changes q thrus/Monday -Cont Vanc/Cefepime---would transition within 24-48 hours to probable Levaquin -blood pressures l initially nor normal on admission now better -Wean Solu-cortef 50 IV q6-->every 12 -cont Valacyclovir 1000 qd for shingles -Chest x-ray repeat was 12/28 confirms evidence of pneumonia -IVF DC 1229 - pro-calcitonin = 0.8-->0.3 -Appreciate general surgery in  that regarding debridement and has Unna boots on that we'll need to be monitored  Acute hypoxic and hypercarbic resp failure -Precipitant likely chronic pain medications in a setting of pneumonia -Was on Face mask overnght -changed to Nasal canula  Afib with RVR, CHad2Vasc2 score=6-rate now controlled 70's to 80s -Loaded with Digoxin 12/28 -give PO Digoxin 0.0625 ongoing -Transitioned from Cardizem Gtt home dose Cardizem 240 09/14/16 -Cont metoprolol 100 bid -Non-candidate for systemic AC probably 2/2 TCP  Possible AECHF/Diastolic-prior EF 60 % 14970 Pulmonary hypertension PA SP 46 mm Echo 12/28 EF 60-65 percent -see above discussion-holding Torsemide 80 daily for now and will slowly need to be implement dependent on pressures and sepsis treatment  Chr pain with Lumbar surgery and prior Spinal stimulator -Hold Morphine sulphate  ER 15 bid  -Resume OXY IR for now -Long discussion with patient about respiratory failure and need to decrease meds  TCP 2/2 to ITP -monitor daily counts. -cont stress dosing of steroids and taper eventually back to prednisone  Prior CAD STEMI 09/2012 s/p DES -resume plavix 75 daily  Hypokalemia -Iatrogenic and discontinued potassium chloride supplementation  Urinary retention -foley placed 12/27 -We will attempt 09/14/16 voiding trial   Prior L Hallux hematoma foll Dr. Amalia Hailey  HLD     DVT prophylaxis: Lovenox Code Status: Full Family Communication: Update son on phone 09/14/16-attempted again 12/29 however no answer on telephone Disposition Plan: SDU   Consultants:   Gen surgery  Procedures:    none yet  Antimicrobials:   Vanc 12/28  Cefepime 12/28    Subjective:  Pleasant alert Oriented however slow mentation and verbalization Multiple  questions about meds No chest pain No fever No chills No rigor   Objective: Vitals:   09/14/16 0100 09/14/16 0200 09/14/16 0400 09/14/16 0500  BP: 128/66 110/69 (!) 115/55     Pulse: 77 82 (!) 49   Resp: _0 Temp:   98 F (36.7 C)   TempSrc:   Oral   SpO2: 96% 96% 98%   Weight:    98.1 kg (216 lb 4.3 oz)  Height:        Intake/Output Summary (Last 24 hours) at 09/14/16 0821 Last data filed at 09/14/16 0600  Gross per 24 hour  Intake          1595.84 ml  Output             1350 ml  Net           245.84 ml   Filed Weights   09/12/16 1851 09/13/16 0400 09/14/16 0500  Weight: 97.3 kg (214 lb 6.4 oz) 98 kg (216 lb 0.8 oz) 98.1 kg (216 lb 4.3 oz)    Examination:  General exam: moaning  Respiratory system: Clear to auscultation laterally. Respiratory effort normal. Cardiovascular system: S1 & S2 heard, RRR. No JVD, murmurs.  Grade 2-3 LE edema Gastrointestinal system: Abdomen is nondistended, soft and nontender. No organomegaly Central nervous system: Alert and oriented. No focal neurological deficits. Extremities: Symmetric 5 x 5 power. Skin:Swollen lower extremities-upper calves wrapped in unna boots and not examined today Psychiatry: Judgement and insight appear normal. Mood & affect appropriate.     Data Reviewed: I have personally reviewed following labs and imaging studies  CBC:  Recent Labs Lab 09/12/16 0417 09/13/16 0219  WBC 11.2* 7.8  HGB 11.1* 11.5*  HCT 37.0 37.6  MCV 91.4 90.4  PLT 111* 73*   Basic Metabolic Panel:  Recent Labs Lab 09/12/16 0417 09/13/16 0219  NA 139 135  K 4.3 5.2*  CL 101 102  CO2 31 23  GLUCOSE 128* 121*  BUN 31* 32*  CREATININE 1.33* 1.23*  CALCIUM 7.9* 7.8*   GFR: Estimated Creatinine Clearance: 37.9 mL/min (by C-G formula based on SCr of 1.23 mg/dL (H)). Liver Function Tests:  Recent Labs Lab 09/13/16 0219  AST 10*  ALT 6*  ALKPHOS 63  BILITOT 0.8  PROT 6.1*  ALBUMIN 2.0*   No results for input(s): LIPASE, AMYLASE in the last 168 hours. No results for input(s): AMMONIA in the last 168 hours. Coagulation Profile:  Recent Labs Lab 09/12/16 1206  INR 1.47   Cardiac  Enzymes: No results for input(s): CKTOTAL, CKMB, CKMBINDEX, TROPONINI in the last 168 hours. BNP (last 3 results) No results for input(s): PROBNP in the last 8760 hours. HbA1C: No results for input(s): HGBA1C in the last 72 hours. CBG: No results for input(s): GLUCAP in the last 168 hours. Lipid Profile: No results for input(s): CHOL, HDL, LDLCALC, TRIG, CHOLHDL, LDLDIRECT in the last 72 hours. Thyroid Function Tests: No results for input(s): TSH, T4TOTAL, FREET4, T3FREE, THYROIDAB in the last 72 hours. Anemia Panel: No results for input(s): VITAMINB12, FOLATE, FERRITIN, TIBC, IRON, RETICCTPCT in the last 72 hours. Sepsis Labs:  Recent Labs Lab 09/12/16 0502 09/12/16 0809 09/12/16 0815 09/12/16 1206 09/14/16 0442  PROCALCITON  --  0.80  --   --  0.36  LATICACIDVEN 1.94*  --  1.19 1.5  --     Recent Results (from the past 240 hour(s))  Blood culture (routine x 2)     Status: None (  Preliminary result)   Collection Time: 09/12/16  4:15 AM  Result Value Ref Range Status   Specimen Description BLOOD LEFT ARM  Final   Special Requests BOTTLES DRAWN AEROBIC AND ANAEROBIC 10ML  Final   Culture NO GROWTH 1 DAY  Final   Report Status PENDING  Incomplete  Blood culture (routine x 2)     Status: None (Preliminary result)   Collection Time: 09/12/16  4:25 AM  Result Value Ref Range Status   Specimen Description BLOOD LEFT HAND  Final   Special Requests IN PEDIATRIC BOTTLE 3ML  Final   Culture NO GROWTH 1 DAY  Final   Report Status PENDING  Incomplete  Urine culture     Status: Abnormal   Collection Time: 09/12/16 11:10 AM  Result Value Ref Range Status   Specimen Description URINE, CATHETERIZED  Final   Special Requests NONE  Final   Culture >=100,000 COLONIES/mL KLEBSIELLA PNEUMONIAE (A)  Final   Report Status 09/14/2016 FINAL  Final   Organism ID, Bacteria KLEBSIELLA PNEUMONIAE (A)  Final      Susceptibility   Klebsiella pneumoniae - MIC*    AMPICILLIN 16 RESISTANT Resistant      CEFAZOLIN <=4 SENSITIVE Sensitive     CEFTRIAXONE <=1 SENSITIVE Sensitive     CIPROFLOXACIN <=0.25 SENSITIVE Sensitive     GENTAMICIN <=1 SENSITIVE Sensitive     IMIPENEM <=0.25 SENSITIVE Sensitive     NITROFURANTOIN 64 INTERMEDIATE Intermediate     TRIMETH/SULFA <=20 SENSITIVE Sensitive     AMPICILLIN/SULBACTAM 4 SENSITIVE Sensitive     PIP/TAZO <=4 SENSITIVE Sensitive     Extended ESBL NEGATIVE Sensitive     * >=100,000 COLONIES/mL KLEBSIELLA PNEUMONIAE  MRSA PCR Screening     Status: Abnormal   Collection Time: 09/12/16  6:59 PM  Result Value Ref Range Status   MRSA by PCR POSITIVE (A) NEGATIVE Final    Comment:        The GeneXpert MRSA Assay (FDA approved for NASAL specimens only), is one component of a comprehensive MRSA colonization surveillance program. It is not intended to diagnose MRSA infection nor to guide or monitor treatment for MRSA infections. RESULT CALLED TO, READ BACK BY AND VERIFIED WITH: M.WHITE,RN AT 2345 BY L.PITT 09/12/16          Radiology Studies: Dg Chest Port 1 View  Result Date: 09/13/2016 CLINICAL DATA:  82 year old female with shortness of breath, sepsis. Initial encounter. EXAM: PORTABLE CHEST 1 VIEW COMPARISON:  09/12/2016 and earlier. FINDINGS: Portable AP semi upright view at 0644 hours. Stable cardiac size and mediastinal contours. Mid thoracic spinal stimulator device re- demonstrated. Partially visible lumbar fusion hardware. Stable lung volumes. Mild to moderate coarse and streaky bilateral perihilar pulmonary opacity. As before this is most confluent about the left hilum. No pneumothorax or pleural effusion. Ventilation has mildly improved since yesterday. IMPRESSION: 1. Improved ventilation since yesterday with residual coarse interstitial and streaky pulmonary opacity. Favor acute infection superimposed on atelectasis and vascular congestion. 2. No pleural effusion or new cardiopulmonary abnormality. Electronically Signed   By: Genevie Ann M.D.   On: 09/13/2016 08:02   Dg Chest Port 1 View  Result Date: 09/12/2016 CLINICAL DATA:  Shortness of Breath EXAM: PORTABLE CHEST 1 VIEW COMPARISON:  Study obtained earlier in the day FINDINGS: There is generalized interstitial pulmonary edema. Patchy airspace opacity in the left mid lung is stable. No new opacity is evident. No evident pneumothorax. There is cardiomegaly with pulmonary venous hypertension. No evident adenopathy.  Stimulator is present with tip in mid thoracic region. There is postoperative change in the lumbar region. IMPRESSION: Evidence fluid with a degree of congestive heart failure with questionable superimposed pneumonia left mid lung, stable. There is cardiomegaly with pulmonary venous hypertension. The overall appearance is stable compared to earlier in the day. Electronically Signed   By: Lowella Grip III M.D.   On: 09/12/2016 12:41        Scheduled Meds: . ceFEPime (MAXIPIME) IV  2 g Intravenous Q24H  . Chlorhexidine Gluconate Cloth  6 each Topical Q0600  . clopidogrel  75 mg Oral Q breakfast  . digoxin  0.0625 mg Oral Daily  . gabapentin  600 mg Oral TID  . hydrocerin   Topical BID  . hydrocortisone sod succinate (SOLU-CORTEF) inj  50 mg Intravenous Q6H  . lidocaine (PF)  30 mL Intradermal Once  . metoprolol  100 mg Oral BID  . morphine  15 mg Oral BID  . mupirocin ointment  1 application Nasal BID  . pantoprazole  40 mg Oral Daily  . polyvinyl alcohol  1 drop Both Eyes BID  . sodium chloride  1 spray Each Nare TID  . valACYclovir  1,000 mg Oral Daily  . vancomycin  1,250 mg Intravenous Q24H   Continuous Infusions: . sodium chloride 50 mL/hr at 09/13/16 1405  . diltiazem (CARDIZEM) infusion 5 mg/hr (09/14/16 0600)     LOS: 2 days    Time spent: 88    Nita Sells, MD [facility] Pager [phone]  If 7PM-7AM, please contact night-coverage [email or web address] Password [email or web address] 09/14/2016, 8:21 AM

## 2016-09-15 LAB — CBC WITH DIFFERENTIAL/PLATELET
BASOS PCT: 0 %
Basophils Absolute: 0 10*3/uL (ref 0.0–0.1)
EOS PCT: 0 %
Eosinophils Absolute: 0 10*3/uL (ref 0.0–0.7)
HCT: 37.7 % (ref 36.0–46.0)
HEMOGLOBIN: 11.6 g/dL — AB (ref 12.0–15.0)
LYMPHS ABS: 0.8 10*3/uL (ref 0.7–4.0)
Lymphocytes Relative: 7 %
MCH: 27.6 pg (ref 26.0–34.0)
MCHC: 30.8 g/dL (ref 30.0–36.0)
MCV: 89.8 fL (ref 78.0–100.0)
MONOS PCT: 3 %
Monocytes Absolute: 0.4 10*3/uL (ref 0.1–1.0)
NEUTROS PCT: 90 %
Neutro Abs: 10 10*3/uL — ABNORMAL HIGH (ref 1.7–7.7)
Platelets: 127 10*3/uL — ABNORMAL LOW (ref 150–400)
RBC: 4.2 MIL/uL (ref 3.87–5.11)
RDW: 15.5 % (ref 11.5–15.5)
WBC: 11.2 10*3/uL — AB (ref 4.0–10.5)

## 2016-09-15 LAB — COMPREHENSIVE METABOLIC PANEL
ALBUMIN: 2.2 g/dL — AB (ref 3.5–5.0)
ALK PHOS: 50 U/L (ref 38–126)
ALT: 8 U/L — ABNORMAL LOW (ref 14–54)
ANION GAP: 8 (ref 5–15)
AST: 12 U/L — ABNORMAL LOW (ref 15–41)
BUN: 33 mg/dL — ABNORMAL HIGH (ref 6–20)
CALCIUM: 8.4 mg/dL — AB (ref 8.9–10.3)
CHLORIDE: 107 mmol/L (ref 101–111)
CO2: 22 mmol/L (ref 22–32)
Creatinine, Ser: 1.03 mg/dL — ABNORMAL HIGH (ref 0.44–1.00)
GFR calc non Af Amer: 49 mL/min — ABNORMAL LOW (ref >60)
GFR, EST AFRICAN AMERICAN: 57 mL/min — AB (ref >60)
GLUCOSE: 153 mg/dL — AB (ref 65–99)
POTASSIUM: 4.5 mmol/L (ref 3.5–5.1)
SODIUM: 137 mmol/L (ref 135–145)
Total Bilirubin: 0.6 mg/dL (ref 0.3–1.2)
Total Protein: 5.8 g/dL — ABNORMAL LOW (ref 6.5–8.1)

## 2016-09-15 MED ORDER — HYDROCORTISONE NA SUCCINATE PF 100 MG IJ SOLR
50.0000 mg | Freq: Two times a day (BID) | INTRAMUSCULAR | Status: DC
  Administered 2016-09-16 – 2016-09-17 (×3): 50 mg via INTRAVENOUS
  Filled 2016-09-15 (×3): qty 2

## 2016-09-15 MED ORDER — LEVOFLOXACIN 750 MG PO TABS
750.0000 mg | ORAL_TABLET | ORAL | Status: DC
  Administered 2016-09-15 – 2016-09-19 (×3): 750 mg via ORAL
  Filled 2016-09-15 (×4): qty 1

## 2016-09-15 MED ORDER — AMITRIPTYLINE HCL 25 MG PO TABS
25.0000 mg | ORAL_TABLET | Freq: Every day | ORAL | Status: DC
  Administered 2016-09-15 – 2016-09-19 (×5): 25 mg via ORAL
  Filled 2016-09-15 (×5): qty 1

## 2016-09-15 NOTE — Progress Notes (Signed)
Pharmacy Antibiotic Note  [name] [name] is a 82 y.o. female admitted on 09/12/2016 with cellulitis and UTI.  Pharmacy has been consulted for Levaquin dosing. Patient has received 3 days of IV vancomycin and cefepime. Urine culture shows Klebsiella pneumoniae (R amp, I Macrobid). Afebrile, wbc now wnl, scr trending down 1.23 (appears at baseline, adj CrCl ~45-50 ml/min), baseline QTc 481.   Plan: Levaquin 750 mg PO every 48 hours Monitor clinical improvement Follow-up length of therapy and de-escalation  Height: _0  (157.5 cm) Weight: 216 lb 4.3 oz (98.1 kg) IBW/kg (Calculated) : 50.1  Temp (24hrs), Avg:97.7 F (36.5 C), Min:97.4 F (36.3 C), Max:98.2 F (36.8 C)   Recent Labs Lab 09/12/16 0417 09/12/16 0502 09/12/16 0815 09/12/16 1206 09/13/16 0219  WBC 11.2*  --   --   --  7.8  CREATININE 1.33*  --   --   --  1.23*  LATICACIDVEN  --  1.94* 1.19 1.5  --     Estimated Creatinine Clearance: 37.9 mL/min (by C-G formula based on SCr of 1.23 mg/dL (H)).    Allergies  Allergen Reactions  . Keflex [Cephalexin] Other (See Comments)    intolerance    Antimicrobials this admission:  Zosyn 12/27 x 1 in ED Vanco 12/27>>12/30 Cefepime 12/27>>12/30 Levaquin 12/30>>  Dose adjustments this admission:  None  Microbiology results:  12/27 BCx: ngtd x 1d 12/27 UCx: klebsiella pneumonia (R to amp and I to macrobid) 12/27 MRSA PCR: positive Strep pneumo urine: negative Legionella urine: negative  Thank you for allowing pharmacy to be a part of this patient's care.  Belia Heman, PharmD PGY1 Pharmacy Resident [phone] (Pager) 09/15/2016 9:06 AM

## 2016-09-15 NOTE — Progress Notes (Addendum)
Patient teaching and reinforcement provided on the importance of performing simple tasks which patient is capable of performing.  Observed patient drinking from cup with straw without any difficulty.  When patient realized that staff was in the room, she tried to manipulate staff into staying at the bedside to hold the cup so she could drink stating that she was shaky.  This was not previously observed.  Patient was able to hold her cup without any difficulty.  Asked patient if there were any issues with her performing these ADLs to which she indicated that "nurses were here to wait on her".  Explained that staff was here to assist as necessary but not to enable her which in turn would prevent her from improving overall function.  Patient will need reinforcement as necessary and staff to be consistent to maintain continuity of care.    While standing near doorway to room, observed patient holding cup, drinking, and then slamming cup down on table.  No shaking or difficulty performing this task noted.

## 2016-09-15 NOTE — Progress Notes (Signed)
[facility] Progress Note  Patient: [name] [name] QBV:[id number]   PCP: Mathews Argyle, MD DOB: [birth year]   DOA: 09/12/2016   DOS: 09/15/2016   Date of Service: the patient was seen and examined on 09/15/2016  Brief hospital course: 82 y/o  SNF resident, afib CHad2Vasc2 score=6, on coumadin in the past but this was taken off of patient med list because of thrombocytopenia and is currently on Plavix, ITP Followed previously by Dr. Leroy Libman Prednisone, CKD stg III, DJD s/p Lumbar disk surgery 2005, neurostim 2008, CAD 09/2012 s/p DES, HTN, PVD with severe venous insuff, Chr Diastolic HF-EF 28% in 0034,  Presented form SNF with Sepsis 2/2 to either HCAP or superimposed cellulitis on her LE acute respiratory failure Admitted to SDU, gen surgery consutled, Echo performed on admission found to be in rapid A. fib and needed to be loaded with digoxin on 12/28  Currently further plan is for improvement in symptoms continue antibiotics.  Assessment and Plan: Severe Sepsis-PNA +/- Cellulitis--clinically patient has both sources Klebsiella UTI -appreciate gen surgery input/WOC nurse input.  Cont UNNA boot changes q thrus/Monday -Initially on Vanc/Cefepime--- changed to Levaquin -Wean Solu-cortef 50 IV q6-->every 12 -cont Valacyclovir 1000 qd for shingles -Chest x-ray repeat was 12/28 confirms evidence of pneumonia -IVF DC 1229 - pro-calcitonin = 0.8-->0.3 -Appreciate general surgery in that regarding debridement and has Unna boots on that we'll need to be monitored  Acute hypoxic and hypercarbic resp failure -Precipitant likely chronic pain medications in a setting of pneumonia -Was on Face mask overnght -changed to Nasal canula venous tolerated  Afib with RVR, CHad2Vasc2 score=6-rate now controlled 70's to 80s -Loaded with Digoxin 12/28 -give PO Digoxin 0.0625 ongoing -Transitioned from Cardizem Gtt home dose Cardizem 240 09/14/16 -Cont metoprolol 100 bid -Non-candidate for  systemic AC probably 2/2 TCP  Possible AECHF/Diastolic-prior EF 60 % 91791 Pulmonary hypertension PA SP 46 mm Echo 12/28 EF 60-65 percent -see above discussion-holding Torsemide 80 daily for now and will slowly need to be implement dependent on pressures and sepsis treatment  Chr pain with Lumbar surgery and prior Spinal stimulator -Hold Morphine sulphate  ER 15 bid  -Resume OXY IR for now -Long discussion with patient about respiratory failure and need to decrease meds Low-dose amitriptyline.  Thrombocytopenia 2/2 to ITP -monitor daily counts. -cont stress dosing of steroids and taper eventually back to prednisone  Prior CAD STEMI 09/2012 s/p DES -resume plavix 75 daily  Hypokalemia -Iatrogenic and discontinued potassium chloride supplementation  Urinary retention -foley placed 12/27  Bowel regimen: last BM 09/14/2016 Diet: Cardiac diet DVT Prophylaxis: subcutaneous Heparin  Advance goals of care discussion: DNR/DNI  Family Communication: no family was present at bedside, at the time of interview.   Disposition:  Discharge to SNF. Expected discharge date: 09/17/2016, improvement in symptoms  Consultants: Gen. surgery Procedures: none  Antibiotics: Anti-infectives    Start     Dose/Rate Route Frequency Ordered Stop   09/15/16 1000  levofloxacin (LEVAQUIN) tablet 750 mg     750 mg Oral Every 48 hours 09/15/16 0905     09/13/16 0500  vancomycin (VANCOCIN) 1,250 mg in sodium chloride 0.9 % 250 mL IVPB  Status:  Discontinued     1,250 mg 166.7 mL/hr over 90 Minutes Intravenous Every 24 hours 09/12/16 0942 09/15/16 0842   09/12/16 1230  ceFEPIme (MAXIPIME) 2 g in dextrose 5 % 50 mL IVPB  Status:  Discontinued     2 g 100 mL/hr over 30 Minutes Intravenous Every 24 hours  09/12/16 0942 09/15/16 0842   09/12/16 1000  valACYclovir (VALTREX) tablet 1,000 mg     1,000 mg Oral Daily 09/12/16 0809     09/12/16 0500  vancomycin (VANCOCIN) IVPB 1000 mg/200 mL premix      1,000 mg 200 mL/hr over 60 Minutes Intravenous  Once 09/12/16 0457 09/12/16 0612   09/12/16 0500  piperacillin-tazobactam (ZOSYN) IVPB 3.375 g     3.375 g 100 mL/hr over 30 Minutes Intravenous  Once 09/12/16 0457 09/12/16 0538        Subjective: This about generalized body ache. Denies any nausea or vomiting. Tolerating oral diet. Breathing is better.  Objective: Physical Exam: Vitals:   09/15/16 1200 09/15/16 1400 09/15/16 1500 09/15/16 1600  BP: (!) 150/79 137/62 122/61 134/65  Pulse: 76 (!) 57 (!) 40 (!) 53  Resp: (!) _0 Temp: 97.5 F (36.4 C)     TempSrc: Oral     SpO2: 97% (!) 87% 95% 98%  Weight:      Height:        Intake/Output Summary (Last 24 hours) at 09/15/16 1811 Last data filed at 09/15/16 1637  Gross per 24 hour  Intake              220 ml  Output             1650 ml  Net            -1430 ml   Filed Weights   09/13/16 0400 09/14/16 0500 09/15/16 0500  Weight: 98 kg (216 lb 0.8 oz) 98.1 kg (216 lb 4.3 oz) 98.1 kg (216 lb 4.3 oz)    General: Alert, Awake and Oriented to Time, Place and Person. Appear in mild distress, affect appropriate Eyes: PERRL, Conjunctiva normal ENT: Oral Mucosa clear moist. Neck: no JVD, no Abnormal Mass Or lumps Cardiovascular: S1 and S2 Present, no Murmur, Respiratory: Bilateral Air entry equal and Decreased, no use of accessory muscle, basal Crackles, no wheezes Abdomen: Bowel Sound present, Soft and no tenderness Skin: no redness, no Rash, no induration Extremities: bilateral Pedal edema, no calf tenderness Neurologic: Grossly no focal neuro deficit. Bilaterally Equal motor strength  Data Reviewed: CBC:  Recent Labs Lab 09/12/16 0417 09/13/16 0219 09/15/16 0842  WBC 11.2* 7.8 11.2*  NEUTROABS  --   --  10.0*  HGB 11.1* 11.5* 11.6*  HCT 37.0 37.6 37.7  MCV 91.4 90.4 89.8  PLT 111* 73* 286*   Basic Metabolic Panel:  Recent Labs Lab 09/12/16 0417 09/13/16 0219 09/15/16 0842  NA 139 135 137  K 4.3  5.2* 4.5  CL 101 102 107  CO2 _1 GLUCOSE 128* 121* 153*  BUN 31* 32* 33*  CREATININE 1.33* 1.23* 1.03*  CALCIUM 7.9* 7.8* 8.4*    Liver Function Tests:  Recent Labs Lab 09/13/16 0219 09/15/16 0842  AST 10* 12*  ALT 6* 8*  ALKPHOS 63 50  BILITOT 0.8 0.6  PROT 6.1* 5.8*  ALBUMIN 2.0* 2.2*   No results for input(s): LIPASE, AMYLASE in the last 168 hours. No results for input(s): AMMONIA in the last 168 hours. Coagulation Profile:  Recent Labs Lab 09/12/16 1206  INR 1.47   Cardiac Enzymes: No results for input(s): CKTOTAL, CKMB, CKMBINDEX, TROPONINI in the last 168 hours. BNP (last 3 results) No results for input(s): PROBNP in the last 8760 hours.  CBG: No results for input(s): GLUCAP in the last 168 hours.  Studies: No results found.  Scheduled Meds: . amitriptyline  25 mg Oral QHS  . Chlorhexidine Gluconate Cloth  6 each Topical Q0600  . clopidogrel  75 mg Oral Q breakfast  . digoxin  0.0625 mg Oral Daily  . diltiazem  240 mg Oral Daily  . gabapentin  600 mg Oral TID  . hydrocerin   Topical BID  . hydrocortisone sod succinate (SOLU-CORTEF) inj  50 mg Intravenous Q6H  . levofloxacin  750 mg Oral Q48H  . lidocaine (PF)  30 mL Intradermal Once  . metoprolol  100 mg Oral BID  . mupirocin ointment  1 application Nasal BID  . pantoprazole  40 mg Oral Daily  . polyvinyl alcohol  1 drop Both Eyes BID  . sodium chloride  1 spray Each Nare TID  . valACYclovir  1,000 mg Oral Daily   Continuous Infusions: PRN Meds: albuterol, guaiFENesin, oxyCODONE  Time spent: 30 minutes  Author: Berle Mull, MD [facility] Pager: [phone] 09/15/2016 6:11 PM  If 7PM-7AM, please contact night-coverage at [email or web address], password [facility]

## 2016-09-16 DIAGNOSIS — M549 Dorsalgia, unspecified: Secondary | ICD-10-CM

## 2016-09-16 DIAGNOSIS — G8929 Other chronic pain: Secondary | ICD-10-CM

## 2016-09-16 DIAGNOSIS — L03119 Cellulitis of unspecified part of limb: Secondary | ICD-10-CM

## 2016-09-16 DIAGNOSIS — J189 Pneumonia, unspecified organism: Secondary | ICD-10-CM

## 2016-09-16 DIAGNOSIS — I5032 Chronic diastolic (congestive) heart failure: Secondary | ICD-10-CM

## 2016-09-16 DIAGNOSIS — D696 Thrombocytopenia, unspecified: Secondary | ICD-10-CM

## 2016-09-16 DIAGNOSIS — E785 Hyperlipidemia, unspecified: Secondary | ICD-10-CM

## 2016-09-16 DIAGNOSIS — I4891 Unspecified atrial fibrillation: Secondary | ICD-10-CM

## 2016-09-16 LAB — BASIC METABOLIC PANEL
ANION GAP: 7 (ref 5–15)
BUN: 27 mg/dL — ABNORMAL HIGH (ref 6–20)
CHLORIDE: 107 mmol/L (ref 101–111)
CO2: 24 mmol/L (ref 22–32)
Calcium: 8.5 mg/dL — ABNORMAL LOW (ref 8.9–10.3)
Creatinine, Ser: 0.99 mg/dL (ref 0.44–1.00)
GFR calc Af Amer: 59 mL/min — ABNORMAL LOW (ref >60)
GFR, EST NON AFRICAN AMERICAN: 51 mL/min — AB (ref >60)
GLUCOSE: 120 mg/dL — AB (ref 65–99)
POTASSIUM: 4.3 mmol/L (ref 3.5–5.1)
Sodium: 138 mmol/L (ref 135–145)

## 2016-09-16 LAB — CBC
HEMATOCRIT: 37.4 % (ref 36.0–46.0)
HEMOGLOBIN: 11.2 g/dL — AB (ref 12.0–15.0)
MCH: 26.9 pg (ref 26.0–34.0)
MCHC: 29.9 g/dL — ABNORMAL LOW (ref 30.0–36.0)
MCV: 89.7 fL (ref 78.0–100.0)
Platelets: 126 10*3/uL — ABNORMAL LOW (ref 150–400)
RBC: 4.17 MIL/uL (ref 3.87–5.11)
RDW: 15.5 % (ref 11.5–15.5)
WBC: 9.8 10*3/uL (ref 4.0–10.5)

## 2016-09-16 LAB — PROCALCITONIN: Procalcitonin: <0.1 ng/mL

## 2016-09-16 MED ORDER — FUROSEMIDE 40 MG PO TABS: 40.0000 mg | ORAL_TABLET | Freq: Every day | ORAL | Status: DC

## 2016-09-16 MED ORDER — TORSEMIDE 20 MG PO TABS
20.0000 mg | ORAL_TABLET | Freq: Every day | ORAL | Status: DC
  Administered 2016-09-16: 20 mg via ORAL
  Filled 2016-09-16 (×2): qty 1

## 2016-09-16 NOTE — Progress Notes (Signed)
PROGRESS NOTE    [name] [name]  HDQ:[id number] DOB: 06/08/1931 DOA: 09/12/2016 PCP: Mathews Argyle, MD    Brief Narrative:  82 y/o SNF resident, afib CHad2Vasc2 score=6, on coumadin in the past but this was taken off of patient med list because of thrombocytopenia and is currently on Plavix, ITP Followed previously by Dr. Leroy Libman Prednisone, CKD stg III, DJD s/p Lumbar disk surgery 2005, neurostim 2008, CAD 09/2012 s/p DES, HTN, PVD with severe venous insuff, Chr Diastolic HF-EF 11% in 9417, Presented form SNF with Sepsis 2/2 to either HCAP or superimposed cellulitis on her LE acute respiratory failure Admitted to SDU, gen surgery consutled, Echo performed on admission found to be in rapid A. fib and needed to be loaded with digoxin on 12/28  Currently further plan is for improvement in symptoms continue antibiotics.    Assessment & Plan:   Active Problems:   HTN (hypertension)   Dyslipidemia   Chronic back pain   Thrombocytopenia (HCC)   CAD (coronary artery disease) of artery bypass graft   Chronic narcotic dependence (HCC)   Sepsis ([place])   Cellulitis of leg   Atrial fibrillation with RVR (HCC)   Leukocytosis   Chronic diastolic heart failure ([place])   HCAP (healthcare-associated pneumonia)   Severe Sepsis-PNA +/-Cellulitis--clinically patient has both sources as well as Klebsiella UTI -appreciate gen surgery input/WOC nurse input. Cont UNNA boot changes q thrus/Monday -Initially on Vanc/Cefepime--- changed to Levaquin -WeanSolu-cortef 50 IV q6-->every 12 and will likely transition to PO prednisone tomorrow -cont Valacyclovir 1000 qd for shingles -Chest x-ray repeat was 12/28 confirms evidence of pneumonia -IVF DC 1229 - pro-calcitonin= 0.8-->0.3 -Appreciate general surgery in that regarding debridement and has Unna boots on that we'll need to be monitored  Acute hypoxicand hypercarbicresp failure -Precipitant likely chronic pain medications in a  setting of pneumonia and possibly some fluid overload -changed to Nasal canula as tolerated - patient on 4L [place] at time of evaluation - BNP elevated at time of discharge  Afib with RVR, CHad2Vasc2 score=6-rate now controlled 70's to 80s -Loaded with Digoxin 12/28 -give PO Digoxin 0.0625 ongoing -Transitioned from Cardizem Gtthome doseCardizem 240 09/14/16 -Cont metoprolol 100 bid -Non-candidate for systemicAC probably 2/2 TCP - currently rate controlled  Possible AECHF/Diastolic-prior EF 60 % 40814 - Pulmonary hypertension PA SP 46 mm - Echo 12/28 EF 60-65 percent - add 30m Torsemide today- see how pressures respond and then titrate up - patient does have some edema on exam today  Chr pain with Lumbar surgery and prior Spinal stimulator -HoldMorphine sulphate ER 15 bid  -Resume OXY IR for now -Long discussion with patient about respiratory failure and need to decrease meds Low-dose amitriptyline.  Thrombocytopenia 2/2 to ITP - monitor daily counts. - cont stress dosing of steroidsand taper eventually back to prednisone - can consider transition to PO prednisone in am - platelet at 126 this am  Prior CAD STEMI 09/2012 s/p DES -resume plavix 75 daily  Hypokalemia -Iatrogenic and discontinued potassium chloride supplementation - potassium at 4.3 this am  Urinary retention -foley placed 12/27  Bowel regimen: last BM 09/14/2016 Diet: Cardiac diet DVT Prophylaxis: subcutaneous Heparin Advance goals of care discussion: DNR/DNI Family Communication: no family was present at bedside, at the time of interview.  Disposition: likely discharge to SNF in the next 24-48 hours pending improvement in symptoms  Consultants: Gen. surgery   Procedures: none   Consultants:   General Surgery  Procedures:   None  Antimicrobials:   Levaquin 12/30  Vancomycin 12/28> 12/30  Cefepime 12/27>12/30  Valtrex 12/27  Zosyn 12/27    Subjective: Patient seen  and asking for a pain pill.  States she hurts all over.  Also mentions that she feels she is developing some swelling in her legs and hands.  Asking if she can discharge back to SNF today.  Objective: Vitals:   09/16/16 0300 09/16/16 0400 09/16/16 0500 09/16/16 0800  BP: (!) 151/59 (!) 157/63 (!) 159/82 (!) 182/90  Pulse: 63 66 76 (!) 101  Resp: _0 (!) 22  Temp:  97.6 F (36.4 C)  97.9 F (36.6 C)  TempSrc:  Axillary  Oral  SpO2: 95% 94% 95% 97%  Weight:  107.7 kg (237 lb 7 oz)    Height:        Intake/Output Summary (Last 24 hours) at 09/16/16 1329 Last data filed at 09/16/16 1054  Gross per 24 hour  Intake                0 ml  Output             2775 ml  Net            -2775 ml   Filed Weights   09/14/16 0500 09/15/16 0500 09/16/16 0400  Weight: 98.1 kg (216 lb 4.3 oz) 98.1 kg (216 lb 4.3 oz) 107.7 kg (237 lb 7 oz)    Examination:  General exam: Appears calm and comfortable  Respiratory system: Crackles in base of left lung. Respiratory effort normal. Cardiovascular system: S1 & S2 heard, irregular irregular R. No JVD, murmurs, rubs, gallops or clicks. 2+ pedal edema. Gastrointestinal system: Abdomen is obese, nondistended, soft and nontender. No organomegaly or masses felt. Normal bowel sounds heard. Central nervous system: Alert. No focal neurological deficits. Extremities: no cyanosis or clubbing. Skin: No rashes, lesions or ulcers Psychiatry: Mood & affect appropriate.     Data Reviewed: I have personally reviewed following labs and imaging studies  CBC:  Recent Labs Lab 09/12/16 0417 09/13/16 0219 09/15/16 0842 09/16/16 0556  WBC 11.2* 7.8 11.2* 9.8  NEUTROABS  --   --  10.0*  --   HGB 11.1* 11.5* 11.6* 11.2*  HCT 37.0 37.6 37.7 37.4  MCV 91.4 90.4 89.8 89.7  PLT 111* 73* 127* 056*   Basic Metabolic Panel:  Recent Labs Lab 09/12/16 0417 09/13/16 0219 09/15/16 0842 09/16/16 0556  NA 139 135 137 138  K 4.3 5.2* 4.5 4.3  CL 101 102 107  107  CO2 _1 GLUCOSE 128* 121* 153* 120*  BUN 31* 32* 33* 27*  CREATININE 1.33* 1.23* 1.03* 0.99  CALCIUM 7.9* 7.8* 8.4* 8.5*   GFR: Estimated Creatinine Clearance: 49.7 mL/min (by C-G formula based on SCr of 0.99 mg/dL). Liver Function Tests:  Recent Labs Lab 09/13/16 0219 09/15/16 0842  AST 10* 12*  ALT 6* 8*  ALKPHOS 63 50  BILITOT 0.8 0.6  PROT 6.1* 5.8*  ALBUMIN 2.0* 2.2*   No results for input(s): LIPASE, AMYLASE in the last 168 hours. No results for input(s): AMMONIA in the last 168 hours. Coagulation Profile:  Recent Labs Lab 09/12/16 1206  INR 1.47   Cardiac Enzymes: No results for input(s): CKTOTAL, CKMB, CKMBINDEX, TROPONINI in the last 168 hours. BNP (last 3 results) No results for input(s): PROBNP in the last 8760 hours. HbA1C: No results for input(s): HGBA1C in the last 72 hours. CBG: No results for input(s): GLUCAP in the last 168 hours. Lipid  Profile: No results for input(s): CHOL, HDL, LDLCALC, TRIG, CHOLHDL, LDLDIRECT in the last 72 hours. Thyroid Function Tests: No results for input(s): TSH, T4TOTAL, FREET4, T3FREE, THYROIDAB in the last 72 hours. Anemia Panel: No results for input(s): VITAMINB12, FOLATE, FERRITIN, TIBC, IRON, RETICCTPCT in the last 72 hours. Sepsis Labs:  Recent Labs Lab 09/12/16 0502 09/12/16 0809 09/12/16 0815 09/12/16 1206 09/14/16 0442 09/16/16 0556  PROCALCITON  --  0.80  --   --  0.36 <0.10  LATICACIDVEN 1.94*  --  1.19 1.5  --   --     Recent Results (from the past 240 hour(s))  Blood culture (routine x 2)     Status: None (Preliminary result)   Collection Time: 09/12/16  4:15 AM  Result Value Ref Range Status   Specimen Description BLOOD LEFT ARM  Final   Special Requests BOTTLES DRAWN AEROBIC AND ANAEROBIC 10ML  Final   Culture NO GROWTH 3 DAYS  Final   Report Status PENDING  Incomplete  Blood culture (routine x 2)     Status: None (Preliminary result)   Collection Time: 09/12/16  4:25 AM    Result Value Ref Range Status   Specimen Description BLOOD LEFT HAND  Final   Special Requests IN PEDIATRIC BOTTLE 3ML  Final   Culture NO GROWTH 3 DAYS  Final   Report Status PENDING  Incomplete  Urine culture     Status: Abnormal   Collection Time: 09/12/16 11:10 AM  Result Value Ref Range Status   Specimen Description URINE, CATHETERIZED  Final   Special Requests NONE  Final   Culture >=100,000 COLONIES/mL KLEBSIELLA PNEUMONIAE (A)  Final   Report Status 09/14/2016 FINAL  Final   Organism ID, Bacteria KLEBSIELLA PNEUMONIAE (A)  Final      Susceptibility   Klebsiella pneumoniae - MIC*    AMPICILLIN 16 RESISTANT Resistant     CEFAZOLIN <=4 SENSITIVE Sensitive     CEFTRIAXONE <=1 SENSITIVE Sensitive     CIPROFLOXACIN <=0.25 SENSITIVE Sensitive     GENTAMICIN <=1 SENSITIVE Sensitive     IMIPENEM <=0.25 SENSITIVE Sensitive     NITROFURANTOIN 64 INTERMEDIATE Intermediate     TRIMETH/SULFA <=20 SENSITIVE Sensitive     AMPICILLIN/SULBACTAM 4 SENSITIVE Sensitive     PIP/TAZO <=4 SENSITIVE Sensitive     Extended ESBL NEGATIVE Sensitive     * >=100,000 COLONIES/mL KLEBSIELLA PNEUMONIAE  MRSA PCR Screening     Status: Abnormal   Collection Time: 09/12/16  6:59 PM  Result Value Ref Range Status   MRSA by PCR POSITIVE (A) NEGATIVE Final    Comment:        The GeneXpert MRSA Assay (FDA approved for NASAL specimens only), is one component of a comprehensive MRSA colonization surveillance program. It is not intended to diagnose MRSA infection nor to guide or monitor treatment for MRSA infections. RESULT CALLED TO, READ BACK BY AND VERIFIED WITH: M.WHITE,RN AT 2345 BY L.PITT 09/12/16          Radiology Studies: No results found.      Scheduled Meds: . amitriptyline  25 mg Oral QHS  . Chlorhexidine Gluconate Cloth  6 each Topical Q0600  . clopidogrel  75 mg Oral Q breakfast  . digoxin  0.0625 mg Oral Daily  . diltiazem  240 mg Oral Daily  . gabapentin  600 mg Oral TID   . hydrocerin   Topical BID  . hydrocortisone sod succinate (SOLU-CORTEF) inj  50 mg Intravenous Q12H  . levofloxacin  750 mg Oral Q48H  . lidocaine (PF)  30 mL Intradermal Once  . metoprolol  100 mg Oral BID  . mupirocin ointment  1 application Nasal BID  . pantoprazole  40 mg Oral Daily  . polyvinyl alcohol  1 drop Both Eyes BID  . sodium chloride  1 spray Each Nare TID  . valACYclovir  1,000 mg Oral Daily   Continuous Infusions:   LOS: 4 days    Time spent: 35 minutes    Loretha Stapler, MD [facility] Pager [phone]  If 7PM-7AM, please contact night-coverage [email or web address] Password [email or web address] 09/16/2016, 1:29 PM

## 2016-09-17 DIAGNOSIS — N390 Urinary tract infection, site not specified: Secondary | ICD-10-CM

## 2016-09-17 DIAGNOSIS — A419 Sepsis, unspecified organism: Principal | ICD-10-CM

## 2016-09-17 LAB — CULTURE, BLOOD (ROUTINE X 2)
Culture: NO GROWTH
Culture: NO GROWTH

## 2016-09-17 LAB — BASIC METABOLIC PANEL
ANION GAP: 7 (ref 5–15)
BUN: 21 mg/dL — ABNORMAL HIGH (ref 6–20)
CALCIUM: 8.6 mg/dL — AB (ref 8.9–10.3)
CO2: 29 mmol/L (ref 22–32)
Chloride: 108 mmol/L (ref 101–111)
Creatinine, Ser: 1.08 mg/dL — ABNORMAL HIGH (ref 0.44–1.00)
GFR, EST AFRICAN AMERICAN: 53 mL/min — AB (ref >60)
GFR, EST NON AFRICAN AMERICAN: 46 mL/min — AB (ref >60)
Glucose, Bld: 181 mg/dL — ABNORMAL HIGH (ref 65–99)
Potassium: 3.3 mmol/L — ABNORMAL LOW (ref 3.5–5.1)
SODIUM: 144 mmol/L (ref 135–145)

## 2016-09-17 LAB — MAGNESIUM: MAGNESIUM: 2 mg/dL (ref 1.7–2.4)

## 2016-09-17 MED ORDER — POTASSIUM CHLORIDE CRYS ER 20 MEQ PO TBCR
40.0000 meq | EXTENDED_RELEASE_TABLET | Freq: Two times a day (BID) | ORAL | Status: DC
  Administered 2016-09-17 – 2016-09-19 (×5): 40 meq via ORAL
  Filled 2016-09-17 (×5): qty 2

## 2016-09-17 MED ORDER — MORPHINE SULFATE ER 15 MG PO TBCR
15.0000 mg | EXTENDED_RELEASE_TABLET | Freq: Two times a day (BID) | ORAL | Status: DC
  Administered 2016-09-17 – 2016-09-20 (×6): 15 mg via ORAL
  Filled 2016-09-17 (×6): qty 1

## 2016-09-17 MED ORDER — GABAPENTIN 300 MG PO CAPS
600.0000 mg | ORAL_CAPSULE | Freq: Two times a day (BID) | ORAL | Status: DC
Start: 2016-09-17 — End: 2016-09-20
  Administered 2016-09-17 – 2016-09-20 (×6): 600 mg via ORAL
  Filled 2016-09-17 (×6): qty 2

## 2016-09-17 MED ORDER — OXYCODONE HCL 5 MG PO TABS
5.0000 mg | ORAL_TABLET | ORAL | Status: DC | PRN
  Administered 2016-09-17 – 2016-09-20 (×12): 5 mg via ORAL
  Filled 2016-09-17 (×13): qty 1

## 2016-09-17 MED ORDER — PREDNISONE 20 MG PO TABS
40.0000 mg | ORAL_TABLET | Freq: Every day | ORAL | Status: DC
  Administered 2016-09-18 – 2016-09-20 (×3): 40 mg via ORAL
  Filled 2016-09-17 (×3): qty 2

## 2016-09-17 MED ORDER — TORSEMIDE 20 MG PO TABS
40.0000 mg | ORAL_TABLET | Freq: Every day | ORAL | Status: DC
  Administered 2016-09-17: 40 mg via ORAL
  Filled 2016-09-17: qty 2

## 2016-09-17 MED ORDER — PREDNISONE 20 MG PO TABS
20.0000 mg | ORAL_TABLET | Freq: Once | ORAL | Status: AC
  Administered 2016-09-17: 20 mg via ORAL
  Filled 2016-09-17: qty 1

## 2016-09-17 NOTE — Progress Notes (Addendum)
[place] [facility] MD returned page, Dressing change rescheduled to tomorrow.

## 2016-09-17 NOTE — Progress Notes (Signed)
PROGRESS NOTE    [name] [name]  JKK:[id number] DOB: [birth year] DOA: 09/12/2016 PCP: Mathews Argyle, MD    Brief Narrative:  83 y/o SNF resident, afib CHad2Vasc2 score=6, on coumadin in the past but this was taken off of patient med list because of thrombocytopenia and is currently on Plavix, ITP Followed previously by Dr. Leroy Libman Prednisone, CKD stg III, DJD s/p Lumbar disk surgery 2005, neurostim 2008, CAD 09/2012 s/p DES, HTN, PVD with severe venous insuff, Chr Diastolic HF-EF 71% in 6967, Presented form SNF with Sepsis 2/2 to either HCAP or superimposed cellulitis on her LE acute respiratory failure Admitted to SDU, gen surgery consutled, Echo performed on admission found to be in rapid A. fib and needed to be loaded with digoxin on 12/28  Currently further plan is for improvement in symptoms continue antibiotics.    Assessment & Plan:   Active Problems:   HTN (hypertension)   Dyslipidemia   Chronic back pain   Thrombocytopenia (HCC)   CAD (coronary artery disease) of artery bypass graft   Chronic narcotic dependence (HCC)   Sepsis ([place])   Cellulitis of leg   Atrial fibrillation with RVR (HCC)   Leukocytosis   Chronic diastolic heart failure ([place])   HCAP (healthcare-associated pneumonia)   Severe Sepsis-PNA +/-Cellulitis--clinically patient has both sources as well as Klebsiella UTI -appreciate gen surgery input/WOC nurse input. Cont UNNA boot changes q thrus/Monday -Initially on Vanc/Cefepime--- changed to Levaquin -WeanSolu-cortef 50 IV q6-->every 12 and transition to PO prednisone today -cont Valacyclovir 1000 qd for shingles -Chest x-ray repeat was 12/28 confirms evidence of pneumonia - pro-calcitonin= 0.8-->0.3 -Appreciate general surgery in that regarding debridement and has Unna boots on that we'll need to be monitored - dressing change scheduled for tomorrow  Acute hypoxicand hypercarbicresp failure -Precipitant likely chronic pain  medications in a setting of pneumonia and possibly some fluid overload -changed to Nasal canula as tolerated - patient on 2L [place] air this am - BNP elevated at time of admission (1,218)  Afib with RVR, CHad2Vasc2 score=6-rate now controlled 70's to 80s -Loaded with Digoxin 12/28 -give PO Digoxin 0.0625 ongoing -Transitioned from Cardizem Gtthome doseCardizem 240 09/14/16 -Cont metoprolol 100 bid -Non-candidate for systemicAC probably 2/2 TCP - currently rate controlled  Possible AECHF/Diastolic-prior EF 60 % 89381 - Pulmonary hypertension PA SP 46 mm - Echo 12/28 EF 60-65 percent - 72m Torsemide today - repeat BMP in am and see if to proceed with daily torsemide - edema improved today - patient had appropriate diuresis with 4L out yesterday  Chr pain with Lumbar surgery and prior Spinal stimulator - Will restart Morphine 136mIR today -Resume OXY IR for now and change dosage frequency back to home frequency -Low-dose amitriptyline.  Thrombocytopenia 2/2 to ITP - monitor daily counts. - transitioned to PO prednisone today - repeat CBCD in am  Prior CAD STEMI 09/2012 s/p DES -resume plavix 75 daily  Hypokalemia -Iatrogenic and discontinued potassium chloride supplementation - potassium at 3.3 today - restart potassium supplementation  Urinary retention -foley placed 12/27  Bowel regimen: last BM 09/14/2016 Diet: Cardiac diet DVT Prophylaxis: subcutaneous Heparin Advance goals of care discussion: DNR/DNI Family Communication: no family was present at bedside, at the time of interview.  Disposition: pending improvement; patient to have dressing change and possible bedside debridement tomorrow  Consultants: Gen. surgery   Procedures: none   Consultants:   General Surgery  Procedures:   None  Antimicrobials:   Levaquin 12/30  Vancomycin 12/28> 12/30  Cefepime 12/27>12/30  Valtrex 12/27  Zosyn 12/27    Subjective: Patient seen and  stating she is in significant all over pain.  She says she needs her pain medication as prescribed.  Denies any new pain, any chest pain, chest pressure, shortness of breath, increased work of breathing, abdominal pain.  Objective: Vitals:   09/17/16 0600 09/17/16 0700 09/17/16 0800 09/17/16 1200  BP: (!) 174/55 (!) 188/85 (!) 188/83 (!) 174/80  Pulse:  100 93 85  Resp: (!) 22 (!) 22 (!) 23 (!) 22  Temp:    97.6 F (36.4 C)  TempSrc:    Oral  SpO2:  93% (!) 89% 94%  Weight:      Height:        Intake/Output Summary (Last 24 hours) at 09/17/16 1329 Last data filed at 09/17/16 0826  Gross per 24 hour  Intake              480 ml  Output             3575 ml  Net            -3095 ml   Filed Weights   09/15/16 0500 09/16/16 0400 09/17/16 0325  Weight: 98.1 kg (216 lb 4.3 oz) 107.7 kg (237 lb 7 oz) 102 kg (224 lb 13.9 oz)    Examination:  General exam: Appears calm and comfortable  Respiratory system: Crackles in base of left lung. Respiratory effort normal. Cardiovascular system: S1 & S2 heard, irregular irregular R. No JVD, murmurs, rubs, gallops or clicks. 1+ pedal edema. Gastrointestinal system: Abdomen is obese, nondistended, soft and nontender. No organomegaly or masses felt. Normal bowel sounds heard. Central nervous system: Alert. No focal neurological deficits. Extremities: no cyanosis or clubbing. Unna boots intact bilaterally, able to move bilateral lower extremities Skin: few lesions on toes noted bilaterally  Psychiatry: Mood & affect appropriate.     Data Reviewed: I have personally reviewed following labs and imaging studies  CBC:  Recent Labs Lab 09/12/16 0417 09/13/16 0219 09/15/16 0842 09/16/16 0556  WBC 11.2* 7.8 11.2* 9.8  NEUTROABS  --   --  10.0*  --   HGB 11.1* 11.5* 11.6* 11.2*  HCT 37.0 37.6 37.7 37.4  MCV 91.4 90.4 89.8 89.7  PLT 111* 73* 127* 076*   Basic Metabolic Panel:  Recent Labs Lab 09/12/16 0417 09/13/16 0219 09/15/16 0842  09/16/16 0556 09/17/16 1155  NA 139 135 137 138 144  K 4.3 5.2* 4.5 4.3 3.3*  CL 101 102 107 107 108  CO2 _0 GLUCOSE 128* 121* 153* 120* 181*  BUN 31* 32* 33* 27* 21*  CREATININE 1.33* 1.23* 1.03* 0.99 1.08*  CALCIUM 7.9* 7.8* 8.4* 8.5* 8.6*   GFR: Estimated Creatinine Clearance: 44.2 mL/min (by C-G formula based on SCr of 1.08 mg/dL (H)). Liver Function Tests:  Recent Labs Lab 09/13/16 0219 09/15/16 0842  AST 10* 12*  ALT 6* 8*  ALKPHOS 63 50  BILITOT 0.8 0.6  PROT 6.1* 5.8*  ALBUMIN 2.0* 2.2*   No results for input(s): LIPASE, AMYLASE in the last 168 hours. No results for input(s): AMMONIA in the last 168 hours. Coagulation Profile:  Recent Labs Lab 09/12/16 1206  INR 1.47   Cardiac Enzymes: No results for input(s): CKTOTAL, CKMB, CKMBINDEX, TROPONINI in the last 168 hours. BNP (last 3 results) No results for input(s): PROBNP in the last 8760 hours. HbA1C: No results for input(s): HGBA1C in the last 72 hours. CBG: No results  for input(s): GLUCAP in the last 168 hours. Lipid Profile: No results for input(s): CHOL, HDL, LDLCALC, TRIG, CHOLHDL, LDLDIRECT in the last 72 hours. Thyroid Function Tests: No results for input(s): TSH, T4TOTAL, FREET4, T3FREE, THYROIDAB in the last 72 hours. Anemia Panel: No results for input(s): VITAMINB12, FOLATE, FERRITIN, TIBC, IRON, RETICCTPCT in the last 72 hours. Sepsis Labs:  Recent Labs Lab 09/12/16 0502 09/12/16 0809 09/12/16 0815 09/12/16 1206 09/14/16 0442 09/16/16 0556  PROCALCITON  --  0.80  --   --  0.36 <0.10  LATICACIDVEN 1.94*  --  1.19 1.5  --   --     Recent Results (from the past 240 hour(s))  Blood culture (routine x 2)     Status: None (Preliminary result)   Collection Time: 09/12/16  4:15 AM  Result Value Ref Range Status   Specimen Description BLOOD LEFT ARM  Final   Special Requests BOTTLES DRAWN AEROBIC AND ANAEROBIC 10ML  Final   Culture NO GROWTH 4 DAYS  Final   Report Status  PENDING  Incomplete  Blood culture (routine x 2)     Status: None (Preliminary result)   Collection Time: 09/12/16  4:25 AM  Result Value Ref Range Status   Specimen Description BLOOD LEFT HAND  Final   Special Requests IN PEDIATRIC BOTTLE 3ML  Final   Culture NO GROWTH 4 DAYS  Final   Report Status PENDING  Incomplete  Urine culture     Status: Abnormal   Collection Time: 09/12/16 11:10 AM  Result Value Ref Range Status   Specimen Description URINE, CATHETERIZED  Final   Special Requests NONE  Final   Culture >=100,000 COLONIES/mL KLEBSIELLA PNEUMONIAE (A)  Final   Report Status 09/14/2016 FINAL  Final   Organism ID, Bacteria KLEBSIELLA PNEUMONIAE (A)  Final      Susceptibility   Klebsiella pneumoniae - MIC*    AMPICILLIN 16 RESISTANT Resistant     CEFAZOLIN <=4 SENSITIVE Sensitive     CEFTRIAXONE <=1 SENSITIVE Sensitive     CIPROFLOXACIN <=0.25 SENSITIVE Sensitive     GENTAMICIN <=1 SENSITIVE Sensitive     IMIPENEM <=0.25 SENSITIVE Sensitive     NITROFURANTOIN 64 INTERMEDIATE Intermediate     TRIMETH/SULFA <=20 SENSITIVE Sensitive     AMPICILLIN/SULBACTAM 4 SENSITIVE Sensitive     PIP/TAZO <=4 SENSITIVE Sensitive     Extended ESBL NEGATIVE Sensitive     * >=100,000 COLONIES/mL KLEBSIELLA PNEUMONIAE  MRSA PCR Screening     Status: Abnormal   Collection Time: 09/12/16  6:59 PM  Result Value Ref Range Status   MRSA by PCR POSITIVE (A) NEGATIVE Final    Comment:        The GeneXpert MRSA Assay (FDA approved for NASAL specimens only), is one component of a comprehensive MRSA colonization surveillance program. It is not intended to diagnose MRSA infection nor to guide or monitor treatment for MRSA infections. RESULT CALLED TO, READ BACK BY AND VERIFIED WITH: M.WHITE,RN AT 2345 BY L.PITT 09/12/16          Radiology Studies: No results found.      Scheduled Meds: . amitriptyline  25 mg Oral QHS  . clopidogrel  75 mg Oral Q breakfast  . digoxin  0.0625 mg Oral  Daily  . diltiazem  240 mg Oral Daily  . gabapentin  600 mg Oral BID  . hydrocerin   Topical BID  . hydrocortisone sod succinate (SOLU-CORTEF) inj  50 mg Intravenous Q12H  . levofloxacin  750 mg  Oral Q48H  . lidocaine (PF)  30 mL Intradermal Once  . metoprolol  100 mg Oral BID  . pantoprazole  40 mg Oral Daily  . polyvinyl alcohol  1 drop Both Eyes BID  . [START ON 09/18/2016] predniSONE  40 mg Oral Q breakfast  . sodium chloride  1 spray Each Nare TID  . torsemide  40 mg Oral Daily  . valACYclovir  1,000 mg Oral Daily   Continuous Infusions:   LOS: 5 days    Time spent: 30 minutes    Loretha Stapler, MD [facility] Pager [phone]  If 7PM-7AM, please contact night-coverage [email or web address] Password [email or web address] 09/17/2016, 1:29 PM

## 2016-09-17 NOTE — Progress Notes (Signed)
Paged [facility] to assess BLE wounds during dress change

## 2016-09-18 DIAGNOSIS — F1120 Opioid dependence, uncomplicated: Secondary | ICD-10-CM

## 2016-09-18 DIAGNOSIS — I25810 Atherosclerosis of coronary artery bypass graft(s) without angina pectoris: Secondary | ICD-10-CM

## 2016-09-18 LAB — CBC WITH DIFFERENTIAL/PLATELET
Basophils Absolute: 0 10*3/uL (ref 0.0–0.1)
Basophils Relative: 0 %
EOS ABS: 0 10*3/uL (ref 0.0–0.7)
EOS PCT: 0 %
HCT: 38.9 % (ref 36.0–46.0)
HEMOGLOBIN: 12.1 g/dL (ref 12.0–15.0)
LYMPHS ABS: 1.2 10*3/uL (ref 0.7–4.0)
Lymphocytes Relative: 10 %
MCH: 27.6 pg (ref 26.0–34.0)
MCHC: 31.1 g/dL (ref 30.0–36.0)
MCV: 88.6 fL (ref 78.0–100.0)
MONOS PCT: 4 %
Monocytes Absolute: 0.4 10*3/uL (ref 0.1–1.0)
Neutro Abs: 9.8 10*3/uL — ABNORMAL HIGH (ref 1.7–7.7)
Neutrophils Relative %: 86 %
Platelets: 122 10*3/uL — ABNORMAL LOW (ref 150–400)
RBC: 4.39 MIL/uL (ref 3.87–5.11)
RDW: 15.5 % (ref 11.5–15.5)
WBC: 11.4 10*3/uL — ABNORMAL HIGH (ref 4.0–10.5)

## 2016-09-18 LAB — BASIC METABOLIC PANEL
Anion gap: 8 (ref 5–15)
BUN: 21 mg/dL — ABNORMAL HIGH (ref 6–20)
CO2: 29 mmol/L (ref 22–32)
Calcium: 8.5 mg/dL — ABNORMAL LOW (ref 8.9–10.3)
Chloride: 102 mmol/L (ref 101–111)
Creatinine, Ser: 0.95 mg/dL (ref 0.44–1.00)
GFR calc Af Amer: >60 mL/min (ref >60)
GFR calc non Af Amer: 54 mL/min — ABNORMAL LOW (ref >60)
Glucose, Bld: 151 mg/dL — ABNORMAL HIGH (ref 65–99)
Potassium: 4.1 mmol/L (ref 3.5–5.1)
Sodium: 139 mmol/L (ref 135–145)

## 2016-09-18 MED ORDER — TORSEMIDE 20 MG PO TABS
60.0000 mg | ORAL_TABLET | Freq: Every day | ORAL | Status: DC
  Administered 2016-09-18 – 2016-09-20 (×3): 60 mg via ORAL
  Filled 2016-09-18 (×3): qty 3

## 2016-09-18 MED ORDER — HYDRALAZINE HCL 20 MG/ML IJ SOLN
10.0000 mg | Freq: Once | INTRAMUSCULAR | Status: AC
  Administered 2016-09-18: 10 mg via INTRAVENOUS
  Filled 2016-09-18: qty 1

## 2016-09-18 MED ORDER — ENSURE ENLIVE PO LIQD
237.0000 mL | Freq: Two times a day (BID) | ORAL | Status: DC
  Administered 2016-09-18 – 2016-09-20 (×3): 237 mL via ORAL

## 2016-09-18 NOTE — Evaluation (Signed)
Physical Therapy Evaluation Patient Details Name: [name] [name] MRN: [id number] DOB: [birth year] Today's Date: 09/18/2016   History of Present Illness  83 y/o SNF resident, afib , CKD stg III, DJD s/p Lumbar disk surgery 2005, neurostim 2008, CAD1/2014 s/p DES, HTN, PVD with severe venous insuff, Chr Diastolic HF, Presented form SNF with Sepsis 2/2 to either HCAP or superimposed cellulitis on her LE acute respiratory failure  Clinical Impression  Patient demonstrates deficits in functional mobility as indicated below. Will need continued skilled PT to address deficits and maximize function. Will see as indicated and progress as tolerated. Recommend SNF upon acute discharge. OF NOTE: HR elevate to 133 with activity but improved with rest    Follow Up Recommendations SNF;Supervision/Assistance - 24 hour    Equipment Recommendations  None recommended by PT    Recommendations for Other Services       Precautions / Restrictions Precautions Precautions: Fall Restrictions Weight Bearing Restrictions: No      Mobility  Bed Mobility Overal bed mobility: Needs Assistance Bed Mobility: Supine to Sit     Supine to sit: Mod assist;HOB elevated     General bed mobility comments: Increased time and effort, moderate assist to come to EOB  Transfers Overall transfer level: Needs assistance Equipment used:  (2 person face to face with chuck pad) Transfers: Sit to/from [employer] Sit to Stand: Mod assist;+2 physical assistance   Squat pivot transfers: Mod assist;+2 physical assistance     General transfer comment: +2 moderate assist to pivot to chair, patient remains in flexed posture  Ambulation/Gait             General Gait Details: non ambulatory at baseline  Stairs            Wheelchair Mobility    Modified Rankin (Stroke Patients Only)       Balance Overall balance assessment: Needs assistance Sitting-balance support: Feet  supported Sitting balance-Leahy Scale: Fair     Standing balance support: During functional activity Standing balance-Leahy Scale: Poor Standing balance comment: fwd flexed posture                             Pertinent Vitals/Pain Pain Assessment: 0-10 Pain Score: 5  Pain Location: bilateral LEs Pain Descriptors / Indicators: Sore;Discomfort Pain Intervention(s): Monitored during session    Home Living Family/patient expects to be discharged to:: Skilled nursing facility                      Prior Function Level of Independence: Needs assistance   Gait / Transfers Assistance Needed: uses power chair at baseline     Comments: uses power chair at baseline with assist for activity and ADLs     Hand Dominance   Dominant Hand: Right    Extremity/Trunk Assessment   Upper Extremity Assessment Upper Extremity Assessment: Generalized weakness (increased UE edema, noted arthritic changes)    Lower Extremity Assessment Lower Extremity Assessment: Generalized weakness (+ arthritic changes, limited assessment due to una boots)    Cervical / Trunk Assessment Cervical / Trunk Assessment:  (increased body habitus)  Communication   Communication: No difficulties  Cognition Arousal/Alertness: Awake/alert Behavior During Therapy: Flat affect Overall Cognitive Status: No family/caregiver present to determine baseline cognitive functioning                      General Comments      Exercises  Assessment/Plan    PT Assessment Patient needs continued PT services  PT Problem List Decreased strength;Decreased activity tolerance;Decreased balance;Decreased mobility;Cardiopulmonary status limiting activity;Obesity;Pain          PT Treatment Interventions DME instruction;Functional mobility training;Therapeutic activities;Therapeutic exercise;Balance training;Neuromuscular re-education;Patient/family education    PT Goals (Current goals can be  found in the Care Plan section)  Acute Rehab PT Goals Patient Stated Goal: to go home PT Goal Formulation: With patient Time For Goal Achievement: 10/02/16 Potential to Achieve Goals: Good    Frequency Min 2X/week   Barriers to discharge        Co-evaluation PT/OT/SLP Co-Evaluation/Treatment: Yes Reason for Co-Treatment: Complexity of the patient's impairments (multi-system involvement);For patient/therapist safety PT goals addressed during session: Mobility/safety with mobility         End of Session Equipment Utilized During Treatment: Oxygen Activity Tolerance: Patient limited by fatigue Patient left: in chair;with call bell/phone within reach Nurse Communication: Mobility status         Time: 6237-6283 PT Time Calculation (min) (ACUTE ONLY): 21 min   Charges:   PT Evaluation $PT Eval Moderate Complexity: 1 Procedure     PT G Codes:        Duncan Dull 10-07-2016, 1:43 PM Alben Deeds, [place] DPT  [phone]

## 2016-09-18 NOTE — Progress Notes (Signed)
PROGRESS NOTE    [name] [name]  NWG:[id number] DOB: April 15, 1931 DOA: 09/12/2016 PCP: Mathews Argyle, MD    Brief Narrative:  83 y/o SNF resident, afib CHad2Vasc2 score=6, on coumadin in the past but this was taken off of patient med list because of thrombocytopenia and is currently on Plavix, ITP Followed previously by Dr. Leroy Libman Prednisone, CKD stg III, DJD s/p Lumbar disk surgery 2005, neurostim 2008, CAD 09/2012 s/p DES, HTN, PVD with severe venous insuff, Chr Diastolic HF-EF 57% in 8469, Presented form SNF with Sepsis 2/2 to either HCAP or superimposed cellulitis on her LE acute respiratory failure Admitted to SDU, gen surgery consutled, Echo performed on admission found to be in rapid A. fib and needed to be loaded with digoxin on 12/28  Patient was continued on IV antibiotics until 09/18/16 when they were changed to PO.  She had her bilateral lower extremity dressings changed on 09/18/16.  Voiding trial on 09/18/16.    Assessment & Plan:   Active Problems:   HTN (hypertension)   Dyslipidemia   Chronic back pain   Thrombocytopenia (HCC)   CAD (coronary artery disease) of artery bypass graft   Chronic narcotic dependence (HCC)   Sepsis ([place])   Cellulitis of leg   Atrial fibrillation with RVR (HCC)   Leukocytosis   Chronic diastolic heart failure ([place])   HCAP (healthcare-associated pneumonia)   Severe Sepsis-PNA +/-Cellulitis--clinically patient has both sources as well as Klebsiella UTI -appreciate gen surgery input/WOC nurse input. Cont UNNA boot changes q thrus/Monday -Initially on Vanc/Cefepime--- changed to Levaquin -WeanSolu-cortef 50 IV q6-->every 12 and transition to PO prednisone today -cont Valacyclovir 1000 qd for shingles -Chest x-ray repeat was 12/28 confirms evidence of pneumonia - pro-calcitonin= 0.8-->0.3 -Appreciate general surgery input - dressing change scheduled for today with possible bedside debridement?  Acute hypoxicand  hypercarbicresp failure -Precipitant likely chronic pain medications in a setting of pneumonia and possibly some fluid overload -changed to Nasal canula as tolerated - patient on 4L [place] air this am - BNP elevated at time of admission (1,218) - strict I/O - patient down 3.5kg since admission  Afib with RVR, CHad2Vasc2 score=6-rate now controlled 70's to 80s -Loaded with Digoxin 12/28 -give PO Digoxin 0.0625 ongoing -Transitioned from Cardizem Gtthome doseCardizem 240 09/14/16 -Cont metoprolol 100 bid -Non-candidate for systemicAC probably 2/2 TCP - currently rate controlled  Possible AECHF/Diastolic-prior EF 60 % 62952 - Pulmonary hypertension PA SP 46 mm - Echo 12/28 EF 60-65 percent - 19m Torsemide today - edema improved today - patient diuresis with 57019mout yesterday  Chr pain with Lumbar surgery and prior Spinal stimulator - Will restart Morphine 1591mR today -Resume OXY IR for now and change dosage frequency back to home frequency -Low-dose amitriptyline.  Thrombocytopenia 2/2 to ITP - monitor daily counts. - transitioned to PO prednisone today - repeat CBCD this am shows platelets are stable  Prior CAD STEMI 09/2012 s/p DES -resume plavix 75 daily  Hypokalemia - potassium at 4.1 today - continue potassium supplementation  Urinary retention -foley placed 12/27 - can try voiding trial today  Bowel regimen: last BM 09/17/16 Diet: Cardiac diet DVT Prophylaxis: subcutaneous Heparin and plavix Advance goals of care discussion: DNR/DNI Family Communication: no family was present at bedside, at the time of interview.  Disposition: pending improvement; patient to have dressing change and possible bedside debridement tomorrow  Consultants: Gen. surgery   Procedures: none   Consultants:   General Surgery  Procedures:   None  Antimicrobials:  Levaquin 12/30  Vancomycin 12/28> 12/30  Cefepime 12/27>12/30  Valtrex 12/27  Zosyn 12/27     Subjective: Pain reports pain is slightly better this morning. Reports she slept relatively well and is waiting for breakfast.  Hopeful for discharge soon.  Overnight required 1 time dose of hydralazine.  Objective: Vitals:   09/18/16 0500 09/18/16 0600 09/18/16 0644 09/18/16 0859  BP: (!) 158/69 (!) 146/65    Pulse: 92 97 (!) 54   Resp: _0 Temp:    97.5 F (36.4 C)  TempSrc:    Oral  SpO2: 95% 94% 95%   Weight: 94.5 kg (208 lb 5.4 oz)     Height:        Intake/Output Summary (Last 24 hours) at 09/18/16 0920 Last data filed at 09/18/16 0417  Gross per 24 hour  Intake              480 ml  Output             5300 ml  Net            -4820 ml   Filed Weights   09/16/16 0400 09/17/16 0325 09/18/16 0500  Weight: 107.7 kg (237 lb 7 oz) 102 kg (224 lb 13.9 oz) 94.5 kg (208 lb 5.4 oz)    Examination:  General exam: Appears calm and comfortable  Respiratory system: No wheezing, rhonchi noted.  Few bibasilar rales. Respiratory effort normal. Cardiovascular system: S1 & S2 heard, irregular irregular R. No JVD, murmurs, rubs, gallops or clicks. 1+ pedal edema. Gastrointestinal system: Abdomen is obese, nondistended, soft and nontender. No organomegaly or masses felt. Normal bowel sounds heard. Central nervous system: Alert. No focal neurological deficits. Extremities: no cyanosis or clubbing. Unna boots intact bilaterally, able to move bilateral lower extremities Skin: few lesions on toes noted bilaterally  Psychiatry: Mood & affect appropriate.     Data Reviewed: I have personally reviewed following labs and imaging studies  CBC:  Recent Labs Lab 09/12/16 0417 09/13/16 0219 09/15/16 0842 09/16/16 0556 09/18/16 0312  WBC 11.2* 7.8 11.2* 9.8 11.4*  NEUTROABS  --   --  10.0*  --  9.8*  HGB 11.1* 11.5* 11.6* 11.2* 12.1  HCT 37.0 37.6 37.7 37.4 38.9  MCV 91.4 90.4 89.8 89.7 88.6  PLT 111* 73* 127* 126* 591*   Basic Metabolic Panel:  Recent Labs Lab  09/13/16 0219 09/15/16 0842 09/16/16 0556 09/17/16 1155 09/17/16 1437 09/18/16 0312  NA 135 137 138 144  --  139  K 5.2* 4.5 4.3 3.3*  --  4.1  CL 102 107 107 108  --  102  CO2 _1 --  29  GLUCOSE 121* 153* 120* 181*  --  151*  BUN 32* 33* 27* 21*  --  21*  CREATININE 1.23* 1.03* 0.99 1.08*  --  0.95  CALCIUM 7.8* 8.4* 8.5* 8.6*  --  8.5*  MG  --   --   --   --  2.0  --    GFR: Estimated Creatinine Clearance: 48.1 mL/min (by C-G formula based on SCr of 0.95 mg/dL). Liver Function Tests:  Recent Labs Lab 09/13/16 0219 09/15/16 0842  AST 10* 12*  ALT 6* 8*  ALKPHOS 63 50  BILITOT 0.8 0.6  PROT 6.1* 5.8*  ALBUMIN 2.0* 2.2*   No results for input(s): LIPASE, AMYLASE in the last 168 hours. No results for input(s): AMMONIA in the last 168 hours. Coagulation Profile:  Recent Labs Lab 09/12/16 1206  INR 1.47   Cardiac Enzymes: No results for input(s): CKTOTAL, CKMB, CKMBINDEX, TROPONINI in the last 168 hours. BNP (last 3 results) No results for input(s): PROBNP in the last 8760 hours. HbA1C: No results for input(s): HGBA1C in the last 72 hours. CBG: No results for input(s): GLUCAP in the last 168 hours. Lipid Profile: No results for input(s): CHOL, HDL, LDLCALC, TRIG, CHOLHDL, LDLDIRECT in the last 72 hours. Thyroid Function Tests: No results for input(s): TSH, T4TOTAL, FREET4, T3FREE, THYROIDAB in the last 72 hours. Anemia Panel: No results for input(s): VITAMINB12, FOLATE, FERRITIN, TIBC, IRON, RETICCTPCT in the last 72 hours. Sepsis Labs:  Recent Labs Lab 09/12/16 0502 09/12/16 0809 09/12/16 0815 09/12/16 1206 09/14/16 0442 09/16/16 0556  PROCALCITON  --  0.80  --   --  0.36 <0.10  LATICACIDVEN 1.94*  --  1.19 1.5  --   --     Recent Results (from the past 240 hour(s))  Blood culture (routine x 2)     Status: None   Collection Time: 09/12/16  4:15 AM  Result Value Ref Range Status   Specimen Description BLOOD LEFT ARM  Final   Special  Requests BOTTLES DRAWN AEROBIC AND ANAEROBIC 10ML  Final   Culture NO GROWTH 5 DAYS  Final   Report Status 09/17/2016 FINAL  Final  Blood culture (routine x 2)     Status: None   Collection Time: 09/12/16  4:25 AM  Result Value Ref Range Status   Specimen Description BLOOD LEFT HAND  Final   Special Requests IN PEDIATRIC BOTTLE 3ML  Final   Culture NO GROWTH 5 DAYS  Final   Report Status 09/17/2016 FINAL  Final  Urine culture     Status: Abnormal   Collection Time: 09/12/16 11:10 AM  Result Value Ref Range Status   Specimen Description URINE, CATHETERIZED  Final   Special Requests NONE  Final   Culture >=100,000 COLONIES/mL KLEBSIELLA PNEUMONIAE (A)  Final   Report Status 09/14/2016 FINAL  Final   Organism ID, Bacteria KLEBSIELLA PNEUMONIAE (A)  Final      Susceptibility   Klebsiella pneumoniae - MIC*    AMPICILLIN 16 RESISTANT Resistant     CEFAZOLIN <=4 SENSITIVE Sensitive     CEFTRIAXONE <=1 SENSITIVE Sensitive     CIPROFLOXACIN <=0.25 SENSITIVE Sensitive     GENTAMICIN <=1 SENSITIVE Sensitive     IMIPENEM <=0.25 SENSITIVE Sensitive     NITROFURANTOIN 64 INTERMEDIATE Intermediate     TRIMETH/SULFA <=20 SENSITIVE Sensitive     AMPICILLIN/SULBACTAM 4 SENSITIVE Sensitive     PIP/TAZO <=4 SENSITIVE Sensitive     Extended ESBL NEGATIVE Sensitive     * >=100,000 COLONIES/mL KLEBSIELLA PNEUMONIAE  MRSA PCR Screening     Status: Abnormal   Collection Time: 09/12/16  6:59 PM  Result Value Ref Range Status   MRSA by PCR POSITIVE (A) NEGATIVE Final    Comment:        The GeneXpert MRSA Assay (FDA approved for NASAL specimens only), is one component of a comprehensive MRSA colonization surveillance program. It is not intended to diagnose MRSA infection nor to guide or monitor treatment for MRSA infections. RESULT CALLED TO, READ BACK BY AND VERIFIED WITH: M.WHITE,RN AT 2345 BY L.PITT 09/12/16          Radiology Studies: No results found.      Scheduled Meds: .  amitriptyline  25 mg Oral QHS  . clopidogrel  75 mg Oral  Q breakfast  . digoxin  0.0625 mg Oral Daily  . diltiazem  240 mg Oral Daily  . gabapentin  600 mg Oral BID  . hydrocerin   Topical BID  . levofloxacin  750 mg Oral Q48H  . lidocaine (PF)  30 mL Intradermal Once  . metoprolol  100 mg Oral BID  . morphine  15 mg Oral BID  . pantoprazole  40 mg Oral Daily  . polyvinyl alcohol  1 drop Both Eyes BID  . potassium chloride  40 mEq Oral BID  . predniSONE  40 mg Oral Q breakfast  . sodium chloride  1 spray Each Nare TID  . torsemide  60 mg Oral Daily  . valACYclovir  1,000 mg Oral Daily   Continuous Infusions:   LOS: 6 days    Time spent: 30 minutes    Loretha Stapler, MD [facility] Pager [phone]  If 7PM-7AM, please contact night-coverage [email or web address] Password [email or web address] 09/18/2016, 9:20 AM

## 2016-09-18 NOTE — Plan of Care (Signed)
Problem: Safety: Goal: Ability to remain free from injury will improve Outcome: Progressing Bed alarm on for pt safety, call light within reach, pt oriented to surrounding  Problem: Tissue Perfusion: Goal: Risk factors for ineffective tissue perfusion will decrease Outcome: Progressing Pt on plavix for VTE  Problem: Bowel/Gastric: Goal: Will not experience complications related to bowel motility Outcome: Progressing Last BM on 09/18/2015

## 2016-09-18 NOTE — Progress Notes (Signed)
Systolic BP running 090-301. One time 10 mg Hydralazine given.

## 2016-09-18 NOTE — Progress Notes (Signed)
Occupational Therapy Evaluation Patient Details Name: [name] [name] MRN: [id number] DOB: July 08, 1931 Today's Date: 09/18/2016    History of Present Illness 83 y/o SNF resident, afib , CKD stg III, DJD s/p Lumbar disk surgery 2005, neurostim 2008, CAD1/2014 s/p DES, HTN, PVD with severe venous insuff, Chr Diastolic HF, Presented form SNF with Sepsis 2/2 to either HCAP or superimposed cellulitis on her LE acute respiratory failure   Clinical Impression   PTA, pt lived at [facility] and was functional with min A at scooter level. Pt presents with a decline in functional status as noted below and will benefit from rehab at SNF to return to PLOF. Will follow acutely to address established goals and facilitate safe DC to next venue of care. HR elevated to 130s with activity.     Follow Up Recommendations  SNF;Supervision/Assistance - 24 hour    Equipment Recommendations  None recommended by OT    Recommendations for Other Services       Precautions / Restrictions Precautions Precautions: Fall Restrictions Weight Bearing Restrictions: No      Mobility Bed Mobility Overal bed mobility: Needs Assistance Bed Mobility: Supine to Sit     Supine to sit: Mod assist;HOB elevated     General bed mobility comments: Increased time and effort, moderate assist to come to EOB  Transfers Overall transfer level: Needs assistance Equipment used:  (2 person face to face with chuck pad) Transfers: Sit to/from [employer] Sit to Stand: Mod assist;+2 physical assistance   Squat pivot transfers: Mod assist;+2 physical assistance     General transfer comment: +2 moderate assist to pivot to chair, patient remains in flexed posture    Balance Overall balance assessment: Needs assistance Sitting-balance support: Feet supported Sitting balance-Leahy Scale: Fair     Standing balance support: During functional activity Standing balance-Leahy Scale: Poor Standing balance comment:  fwd flexed posture at baseline                            ADL Overall ADL's : Needs assistance/impaired     Grooming: Set up;Sitting   Upper Body Bathing: Set up;Supervision/ safety;Sitting   Lower Body Bathing: Maximal assistance;Sit to/from stand   Upper Body Dressing : Minimal assistance;Sitting   Lower Body Dressing: Maximal assistance;Sit to/from stand   Toilet Transfer: +2 for physical assistance;Moderate assistance;Stand-pivot   Toileting- Clothing Manipulation and Hygiene: Maximal assistance       Functional mobility during ADLs: +2 for physical assistance;Moderate assistance General ADL Comments: Decline in status compared to her baseline     Vision     Perception     Praxis      Pertinent Vitals/Pain Pain Assessment: 0-10 Pain Score: 5  Pain Location: bilateral LEs Pain Descriptors / Indicators: Sore;Discomfort Pain Intervention(s): Monitored during session     Hand Dominance Right   Extremity/Trunk Assessment Upper Extremity Assessment Upper Extremity Assessment: Generalized weakness (increased UE edema, noted arthritic changes)   Lower Extremity Assessment Lower Extremity Assessment: Generalized weakness (+ arthritic changes, limited assessment due to una boots)   Cervical / Trunk Assessment Cervical / Trunk Assessment: Kyphotic (increased body habitus)   Communication Communication Communication: No difficulties   Cognition Arousal/Alertness: Awake/alert Behavior During Therapy: Flat affect Overall Cognitive Status: No family/caregiver present to determine baseline cognitive functioning                     General Comments  Exercises Exercises: Other exercises Other Exercises Other Exercises: BUE general AROM as tolerated   Shoulder Instructions      Home Living Family/patient expects to be discharged to:: Skilled nursing facility                                        Prior  Functioning/Environment Level of Independence: Needs assistance  Gait / Transfers Assistance Needed: uses power chair at baseline ADL's / Homemaking Assistance Needed: Pt does some of her ADL but has assistance as needed. Pt states she ususally does more than what she is doing now.    Comments: uses power chair at baseline with assist for activity and ADLs        OT Problem List: Decreased strength;Decreased range of motion;Decreased activity tolerance;Impaired balance (sitting and/or standing);Decreased safety awareness;Cardiopulmonary status limiting activity;Obesity;Pain   OT Treatment/Interventions: Self-care/ADL training;Therapeutic exercise;DME and/or AE instruction;Energy conservation;Therapeutic activities;Patient/family education;Balance training    OT Goals(Current goals can be found in the care plan section) Acute Rehab OT Goals Patient Stated Goal: to go home OT Goal Formulation: With patient Time For Goal Achievement: 10/02/16 Potential to Achieve Goals: Good ADL Goals Pt Will Perform Lower Body Bathing: with min assist;with adaptive equipment;sitting/lateral leans Pt Will Transfer to Toilet: with min assist;bedside commode;squat pivot transfer Additional ADL Goal #1: Bed mobility with Min A with increased HOB in preparation for ADL  OT Frequency: Min 2X/week   Barriers to D/C:            Co-evaluation PT/OT/SLP Co-Evaluation/Treatment: Yes Reason for Co-Treatment: Complexity of the patient's impairments (multi-system involvement);For patient/therapist safety;To address functional/ADL transfers PT goals addressed during session: Mobility/safety with mobility OT goals addressed during session: ADL's and self-care      End of Session Nurse Communication: Mobility status  Activity Tolerance: Patient tolerated treatment well Patient left: in chair;with call bell/phone within reach   Time: 1215-1236 OT Time Calculation (min): 21 min Charges:  OT General  Charges $OT Visit: 1 Procedure OT Evaluation $OT Eval Moderate Complexity: 1 Procedure G-Codes:    Lethia Donlon,HILLARY 10/01/2016, 1:56 PM  Maurie Boettcher, OT/L  [phone] 10/01/16

## 2016-09-18 NOTE — Progress Notes (Signed)
Initial Nutrition Assessment  DOCUMENTATION CODES:   Obesity unspecified  INTERVENTION:    Ensure Enlive po BID, each supplement provides 350 kcal and 20 grams of protein  NUTRITION DIAGNOSIS:   Increased nutrient needs related to chronic illness as evidenced by estimated needs   GOAL:   Patient will meet greater than or equal to 90% of their needs  MONITOR:   PO intake, Supplement acceptance, Labs, Weight trends, Skin, I & O's  REASON FOR ASSESSMENT:   Consult Assessment of nutrition requirement/status  ASSESSMENT:   83 y/o  SNF resident, afib CHad2Vasc2 score=6, on coumadin in the past but this was taken off of patient med list because of thrombocytopenia and is currently on Plavix, ITP Followed previously by Dr. Leroy Libman Prednisone, CKD stg III, DJD s/p Lumbar disk surgery 2005, neurostim 2008, CAD 09/2012 s/p DES, HTN, PVD with severe venous insuff, Chr Diastolic HF-EF 37% in 5423, Presented form SNF with Sepsis 2/2 to either HCAP or superimposed cellulitis on her LE acute respiratory failure  Pt admitted with severe sepsis/PNA and cellulitis. Sleeping  with blanket over body upon RD visit. PO intake 50% per flowsheet records. Would benefit from addition of oral nutrition supplements. Labs and medications reviewed.  RD unable to complete Nutrition Focused Physical Exam at this time.  Diet Order:  Diet regular Room service appropriate? Yes; Fluid consistency: Thin  Skin:   Reviewed, no issues   Last BM:  1/1  Height:   Ht Readings from Last 1 Encounters:  09/12/16 5' 2" (1.575 m)    Weight:   Wt Readings from Last 1 Encounters:  09/18/16 208 lb 5.4 oz (94.5 kg)    Ideal Body Weight:  50 kg  BMI:  Body mass index is 38.1 kg/m.  Estimated Nutritional Needs:   Kcal:  1600-1800  Protein:  80-90 gm  Fluid:  1.6-1.8 L  EDUCATION NEEDS:   No education needs identified at this time  Arthur Holms, RD, LDN Pager #: [phone] After-Hours Pager  #: [phone]

## 2016-09-19 NOTE — Progress Notes (Addendum)
PROGRESS NOTE    [name]  TGP:[id number] DOB: 1931-05-22 DOA: 09/12/2016 PCP: Mathews Argyle, MD     Brief Narrative:  [name] [name] is a 83 year old female with PMHx of A Fib (Chad2Vasc2 score=6 on coumadin in the past but this was taken off of patient med list because of thrombocytopenia and is currently on Plavix), ITP (followed previously by Dr. Marin Olp, now on prednisone), CKD stage III, DJD s/p lumbar disk surgery 2005, neurostim 2008, CAD1/2014 s/p DES, HTN, PVD with severe venous insuff, Chronic diastolic Hf. She presented form SNF with sepsis 2/2 to either HCAP or superimposed cellulitis on her lower extremities. She was admitted, gen surgery consulted. Echo performed on admission found to be in rapid A. fib and needed to be loaded with digoxin on 12/28.   Assessment & Plan:   Active Problems:   HTN (hypertension)   Dyslipidemia   Chronic back pain   Thrombocytopenia (HCC)   CAD (coronary artery disease) of artery bypass graft   Chronic narcotic dependence (HCC)   Sepsis ([place])   Cellulitis of leg   Atrial fibrillation with RVR (HCC)   Leukocytosis   Chronic diastolic heart failure (HCC)   HCAP (healthcare-associated pneumonia)  Severe sepsis secondary to left middle lobe pneumonia, cellulitis, and Klebsiella UTI, POA -Appreciate gen surgery, WOC nurse.Cont UNNA boot changes q Monday/Thursday -Initially on Vanc/Cefepime -->changed to Levaquin -WeanSolu-cortef 50 IV q6 --> every 12 and transition to PO prednisone, wean.  -Cont Valacyclovir 1000 qd for shingles  -Blood cultures negative   Acute hypoxicand hypercarbicresp failure -Precipitant likely chronic pain medications in a setting of pneumonia and possibly some fluid overload -Continue to wean O2 as tolerated   A fib with RVR -CHad2Vasc2 score=6. Non-candidate for systemicAC secondary to ITP  -Loaded with digoxin 12/28, continue PO digoxin  -Transitioned from Cardizem gtt to home dose  cardizem 12/29 -Cont metoprolol   Possible AECHF/Diastolic-prior EF 60 % 30940 - Pulmonary hypertension PA SP 46 mm - Echo 12/28 EF 60-65% - Continue torsemide   Chronic pain with previous lumbar surgery and spinal stimulator -Continue elavil, neurontin, home MS contin and oxy IR   Thrombocytopenia 2/2 to ITP -Prednisone was stopped on 08/29/2016 as outpatient, patient given solucortef and prednisone while in hospital. Will wean steroids.   CAD STEMI 09/2012 s/p DES -Continue plavix   Acute urinary retention -Foley placed 12/27. Removed but did not pass void trial. Replaced foley 1/2.   DVT prophylaxis: unable to place SCDs due to [facility] boot and LE wounds, no anticoag due to ITP. Currently on plavix only Code Status: DNR Family Communication: no family at bedside Disposition Plan: SNF when stable   Consultants:   none  Procedures:   none  Antimicrobials:  Anti-infectives    Start     Dose/Rate Route Frequency Ordered Stop   09/15/16 1000  levofloxacin (LEVAQUIN) tablet 750 mg     750 mg Oral Every 48 hours 09/15/16 0905 09/29/16 0959   09/13/16 0500  vancomycin (VANCOCIN) 1,250 mg in sodium chloride 0.9 % 250 mL IVPB  Status:  Discontinued     1,250 mg 166.7 mL/hr over 90 Minutes Intravenous Every 24 hours 09/12/16 0942 09/15/16 0842   09/12/16 1230  ceFEPIme (MAXIPIME) 2 g in dextrose 5 % 50 mL IVPB  Status:  Discontinued     2 g 100 mL/hr over 30 Minutes Intravenous Every 24 hours 09/12/16 0942 09/15/16 0842   09/12/16 1000  valACYclovir (VALTREX) tablet 1,000 mg  1,000 mg Oral Daily 09/12/16 0809     09/12/16 0500  vancomycin (VANCOCIN) IVPB 1000 mg/200 mL premix     1,000 mg 200 mL/hr over 60 Minutes Intravenous  Once 09/12/16 0457 09/12/16 0612   09/12/16 0500  piperacillin-tazobactam (ZOSYN) IVPB 3.375 g     3.375 g 100 mL/hr over 30 Minutes Intravenous  Once 09/12/16 0457 09/12/16 0538      Subjective: Patient is quite tearful this morning. States  that she has significant pain of bilateral lower extremities. No other complaints today.  Objective: Vitals:   09/19/16 0800 09/19/16 0958 09/19/16 1000 09/19/16 1146  BP:  (!) 192/85  (!) 154/93  Pulse:   (!) 105 77  Resp:    (!) 25  Temp: 97.8 F (36.6 C)   97.8 F (36.6 C)  TempSrc: Oral   Oral  SpO2:    98%  Weight:      Height:        Intake/Output Summary (Last 24 hours) at 09/19/16 1514 Last data filed at 09/19/16 1300  Gross per 24 hour  Intake              240 ml  Output             4650 ml  Net            -4410 ml   Filed Weights   09/17/16 0325 09/18/16 0500 09/19/16 0400  Weight: 102 kg (224 lb 13.9 oz) 94.5 kg (208 lb 5.4 oz) 95.9 kg (211 lb 6.7 oz)    Examination:  General exam: Appears calm and comfortable  Respiratory system: Clear to auscultation. Respiratory effort normal. Cardiovascular system: S1 & S2 heard, irregular rhythm. No JVD, murmurs, rubs, gallops or clicks. Gastrointestinal system: Abdomen is nondistended, soft and nontender. No organomegaly or masses felt. Normal bowel sounds heard. Central nervous system: Alert and oriented. No focal neurological deficits. Extremities: Symmetric 5 x 5 power. Skin: UNNA boots in place bilaterally, clean and dry   Data Reviewed: I have personally reviewed following labs and imaging studies  CBC:  Recent Labs Lab 09/13/16 0219 09/15/16 0842 09/16/16 0556 09/18/16 0312  WBC 7.8 11.2* 9.8 11.4*  NEUTROABS  --  10.0*  --  9.8*  HGB 11.5* 11.6* 11.2* 12.1  HCT 37.6 37.7 37.4 38.9  MCV 90.4 89.8 89.7 88.6  PLT 73* 127* 126* 213*   Basic Metabolic Panel:  Recent Labs Lab 09/13/16 0219 09/15/16 0842 09/16/16 0556 09/17/16 1155 09/17/16 1437 09/18/16 0312  NA 135 137 138 144  --  139  K 5.2* 4.5 4.3 3.3*  --  4.1  CL 102 107 107 108  --  102  CO2 _0 --  29  GLUCOSE 121* 153* 120* 181*  --  151*  BUN 32* 33* 27* 21*  --  21*  CREATININE 1.23* 1.03* 0.99 1.08*  --  0.95  CALCIUM 7.8*  8.4* 8.5* 8.6*  --  8.5*  MG  --   --   --   --  2.0  --    GFR: Estimated Creatinine Clearance: 48.5 mL/min (by C-G formula based on SCr of 0.95 mg/dL). Liver Function Tests:  Recent Labs Lab 09/13/16 0219 09/15/16 0842  AST 10* 12*  ALT 6* 8*  ALKPHOS 63 50  BILITOT 0.8 0.6  PROT 6.1* 5.8*  ALBUMIN 2.0* 2.2*   No results for input(s): LIPASE, AMYLASE in the last 168 hours. No results for input(s): AMMONIA  in the last 168 hours. Coagulation Profile: No results for input(s): INR, PROTIME in the last 168 hours. Cardiac Enzymes: No results for input(s): CKTOTAL, CKMB, CKMBINDEX, TROPONINI in the last 168 hours. BNP (last 3 results) No results for input(s): PROBNP in the last 8760 hours. HbA1C: No results for input(s): HGBA1C in the last 72 hours. CBG: No results for input(s): GLUCAP in the last 168 hours. Lipid Profile: No results for input(s): CHOL, HDL, LDLCALC, TRIG, CHOLHDL, LDLDIRECT in the last 72 hours. Thyroid Function Tests: No results for input(s): TSH, T4TOTAL, FREET4, T3FREE, THYROIDAB in the last 72 hours. Anemia Panel: No results for input(s): VITAMINB12, FOLATE, FERRITIN, TIBC, IRON, RETICCTPCT in the last 72 hours. Sepsis Labs:  Recent Labs Lab 09/14/16 0442 09/16/16 0556  PROCALCITON 0.36 <0.10    Recent Results (from the past 240 hour(s))  Blood culture (routine x 2)     Status: None   Collection Time: 09/12/16  4:15 AM  Result Value Ref Range Status   Specimen Description BLOOD LEFT ARM  Final   Special Requests BOTTLES DRAWN AEROBIC AND ANAEROBIC 10ML  Final   Culture NO GROWTH 5 DAYS  Final   Report Status 09/17/2016 FINAL  Final  Blood culture (routine x 2)     Status: None   Collection Time: 09/12/16  4:25 AM  Result Value Ref Range Status   Specimen Description BLOOD LEFT HAND  Final   Special Requests IN PEDIATRIC BOTTLE 3ML  Final   Culture NO GROWTH 5 DAYS  Final   Report Status 09/17/2016 FINAL  Final  Urine culture     Status:  Abnormal   Collection Time: 09/12/16 11:10 AM  Result Value Ref Range Status   Specimen Description URINE, CATHETERIZED  Final   Special Requests NONE  Final   Culture >=100,000 COLONIES/mL KLEBSIELLA PNEUMONIAE (A)  Final   Report Status 09/14/2016 FINAL  Final   Organism ID, Bacteria KLEBSIELLA PNEUMONIAE (A)  Final      Susceptibility   Klebsiella pneumoniae - MIC*    AMPICILLIN 16 RESISTANT Resistant     CEFAZOLIN <=4 SENSITIVE Sensitive     CEFTRIAXONE <=1 SENSITIVE Sensitive     CIPROFLOXACIN <=0.25 SENSITIVE Sensitive     GENTAMICIN <=1 SENSITIVE Sensitive     IMIPENEM <=0.25 SENSITIVE Sensitive     NITROFURANTOIN 64 INTERMEDIATE Intermediate     TRIMETH/SULFA <=20 SENSITIVE Sensitive     AMPICILLIN/SULBACTAM 4 SENSITIVE Sensitive     PIP/TAZO <=4 SENSITIVE Sensitive     Extended ESBL NEGATIVE Sensitive     * >=100,000 COLONIES/mL KLEBSIELLA PNEUMONIAE  MRSA PCR Screening     Status: Abnormal   Collection Time: 09/12/16  6:59 PM  Result Value Ref Range Status   MRSA by PCR POSITIVE (A) NEGATIVE Final    Comment:        The GeneXpert MRSA Assay (FDA approved for NASAL specimens only), is one component of a comprehensive MRSA colonization surveillance program. It is not intended to diagnose MRSA infection nor to guide or monitor treatment for MRSA infections. RESULT CALLED TO, READ BACK BY AND VERIFIED WITH: M.WHITE,RN AT 2345 BY L.PITT 09/12/16        Radiology Studies: No results found.    Scheduled Meds: . amitriptyline  25 mg Oral QHS  . clopidogrel  75 mg Oral Q breakfast  . digoxin  0.0625 mg Oral Daily  . diltiazem  240 mg Oral Daily  . feeding supplement (ENSURE ENLIVE)  237 mL Oral BID  BM  . gabapentin  600 mg Oral BID  . hydrocerin   Topical BID  . levofloxacin  750 mg Oral Q48H  . lidocaine (PF)  30 mL Intradermal Once  . metoprolol  100 mg Oral BID  . morphine  15 mg Oral BID  . pantoprazole  40 mg Oral Daily  . polyvinyl alcohol  1 drop  Both Eyes BID  . predniSONE  40 mg Oral Q breakfast  . sodium chloride  1 spray Each Nare TID  . torsemide  60 mg Oral Daily  . valACYclovir  1,000 mg Oral Daily   Continuous Infusions:   LOS: 7 days    Time spent: 40 minutes   Dessa Phi, DO [facility] [email or web address] Password [email or web address] 09/19/2016, 3:14 PM

## 2016-09-20 LAB — CBC WITH DIFFERENTIAL/PLATELET
BASOS PCT: 0 %
Basophils Absolute: 0 10*3/uL (ref 0.0–0.1)
EOS ABS: 0 10*3/uL (ref 0.0–0.7)
EOS PCT: 0 %
HCT: 38.3 % (ref 36.0–46.0)
Hemoglobin: 11.7 g/dL — ABNORMAL LOW (ref 12.0–15.0)
LYMPHS ABS: 1.4 10*3/uL (ref 0.7–4.0)
Lymphocytes Relative: 15 %
MCH: 27.3 pg (ref 26.0–34.0)
MCHC: 30.5 g/dL (ref 30.0–36.0)
MCV: 89.3 fL (ref 78.0–100.0)
MONOS PCT: 4 %
Monocytes Absolute: 0.4 10*3/uL (ref 0.1–1.0)
Neutro Abs: 8 10*3/uL — ABNORMAL HIGH (ref 1.7–7.7)
Neutrophils Relative %: 81 %
PLATELETS: 76 10*3/uL — AB (ref 150–400)
RBC: 4.29 MIL/uL (ref 3.87–5.11)
RDW: 15.9 % — AB (ref 11.5–15.5)
WBC: 9.9 10*3/uL (ref 4.0–10.5)

## 2016-09-20 MED ORDER — GABAPENTIN 600 MG PO TABS: 600.0000 mg | ORAL_TABLET | Freq: Two times a day (BID) | ORAL | 0 refills | Status: DC

## 2016-09-20 MED ORDER — DIGOXIN 62.5 MCG PO TABS: 0.0625 mg | ORAL_TABLET | Freq: Every day | ORAL | 0 refills | Status: DC

## 2016-09-20 MED ORDER — LEVOFLOXACIN 750 MG PO TABS: 750.0000 mg | ORAL_TABLET | ORAL | 0 refills | Status: AC

## 2016-09-20 MED ORDER — PREDNISONE 10 MG PO TABS: ORAL_TABLET | ORAL | 0 refills | Status: DC

## 2016-09-20 NOTE — Care Management Important Message (Signed)
Important Message  Patient Details  Name: [name] [name] MRN: [id number] Date of Birth: 1931/02/10   Medicare Important Message Given:  Yes    Erenest Rasher, RN 09/20/2016, 12:35 PM

## 2016-09-20 NOTE — Care Management Note (Signed)
Case Management Note  Patient Details  Name: [name] [name] MRN: [id number] Date of Birth: [birth year]  Subjective/Objective:  Sepsis, Afib with RVR, Acute Hypoxic and hypercarbic resp failure,  CHF                 Action/Plan: Discharge Planning: AVS reviewed:  Chart reviewed. CSW following for SNF placement. Pt is on oxygen at SNF on 4 L [place].    Expected Discharge Date:      09/21/2015           Expected Discharge Plan:  [place]  In-House Referral:  Clinical Social Work  Discharge planning Services  CM Consult  Post Acute Care Choice:  NA Choice offered to:  NA  DME Arranged:  N/A DME Agency:  NA  HH Arranged:  NA HH Agency:  NA  Status of Service:  Completed, signed off  If discussed at [employer] of Stay Meetings, dates discussed:    Additional Comments:  Erenest Rasher, RN 09/20/2016, 12:27 PM

## 2016-09-20 NOTE — Progress Notes (Signed)
PT Cancellation Note  Patient Details Name: [name] [name] MRN: [id number] DOB: [birth year]   Cancelled Treatment:    Reason Eval/Treat Not Completed: Patient at procedure or test/unavailable. Pt currently having debridement procedure. PT will continue to f/u with pt as available.    [name] 09/20/2016, 9:01 AM Sherie Don, PT, DPT [phone]

## 2016-09-20 NOTE — Discharge Summary (Signed)
Physician Discharge Summary  [name] [name] LNL:[id number] DOB: 12-19-1930 DOA: 09/12/2016  PCP: Mathews Argyle, MD  Admit date: 09/12/2016 Discharge date: 09/20/2016  Admitted From: [facility] Disposition:  [facility]  Recommendations for Outpatient Follow-up:  1. Follow up with PCP in 1-2 weeks 2. Follow up with [facility] in 1 week to have Unna boot removed and wounds evaluated   Home Health: No  Equipment/Devices: None   Discharge Condition: Stable CODE STATUS: DNR  Diet recommendation: Heart healthy   Brief/Interim Summary: [name] [name] is a 83 year old female with PMHx of A Fib (Chad2Vasc2 score=6 on coumadin in the past but this was taken off of patient med list because of thrombocytopenia and is currently on Plavix), ITP (followed previously by Dr. Marin Olp, now on prednisone), CKD stage III, DJD s/p lumbar disk surgery 2005, neurostim 2008, CAD1/2014 s/p DES, HTN, PVD with severe venous insuff, Chronic diastolic Hf. She presented form SNF with sepsis 2/2 to HCAP, superimposed cellulitis on her lower extremities, UTI (klebsiella). She was admitted, gen surgery consulted. Patient was also in A. fib RVR and needed to be loaded within on 12/28. Patient's heart rate was well-controlled with digoxin, and was transitioned from Cardizem drip to oral Cardizem, as well as oral beta blocker. Her wounds were debrided at bedside by general surgery on 1/4. She should follow-up with [facility] in 1 week to evaluate lower extremity wounds.  Subjective on day of discharge: Doing well. No complaints of fever, chest pain, shortness of breath, nausea, vomiting, diarrhea. Continues to have some lower extremity pain, but no worse from yesterday.  Discharge Diagnoses:  Active Problems:   HTN (hypertension)   Dyslipidemia   Chronic back pain   Thrombocytopenia (HCC)   CAD (coronary artery disease) of artery bypass graft   Chronic narcotic dependence (HCC)    Sepsis ([place])   Cellulitis of leg   Atrial fibrillation with RVR (HCC)   Leukocytosis   Chronic diastolic heart failure (HCC)   HCAP (healthcare-associated pneumonia)   Severe sepsis secondary to left middle lobe pneumonia, cellulitis, and Klebsiella UTI, POA -Initially on Vanc/Cefepime -->changed to Levaquin, total 14 day course -WeanSolu-cortef 50 IV q6 (stress dose)--> transitioned to PO prednisone, wean.  -Cont Valacyclovir 1000 qd for shingles  -Blood cultures negative  -Follow-up with general surgery in 1 week to re-evaluate lower extremity wounds   Acute hypoxicand hypercarbicresp failure -Precipitant likely chronic pain medications in a setting of pneumonia and possibly some fluid overload -Now on baseline levels of oxygen  A fib with RVR -CHad2Vasc2 score=6. Non-candidate for systemicAC secondary to ITP  -Loaded with digoxin 12/28, continue PO digoxin  -Transitioned from Cardizem gtt to home dose cardizem 12/29 -Cont metoprolol  -Rate much better controlled  Possible AECHF/Diastolic-prior EF 60 % 40814 - Pulmonary hypertension PA SP 46 mm - Echo 12/28 EF 60-65% - Continue torsemide and potassium   Chronic pain with previous lumbar surgery and spinal stimulator -Continue elavil, neurontin, home MS contin and oxy. Neurontin dose decreased.   Thrombocytopenia 2/2 to ITP -Prednisone was stopped on 08/29/2016 as outpatient, patient given solucortef and prednisone while in hospital. Will wean steroids on discharge  CAD STEMI 09/2012 s/p DES -Continue plavix   Acute urinary retention -Foley placed 12/27. Removed but did not pass void trial. Replaced foley 1/2. Charts with Foley in place, continue voiding trial at [facility]  Discharge Instructions  Discharge Instructions    Call MD for:  redness, tenderness, or signs of infection (pain,  swelling, redness, odor or green/yellow discharge around incision site)    Complete by:  As directed    Call MD for:   temperature >100.4    Complete by:  As directed    Diet - low sodium heart healthy    Complete by:  As directed    Increase activity slowly    Complete by:  As directed      Allergies as of 09/20/2016      Reactions   Keflex [cephalexin] Other (See Comments)   intolerance      Medication List    TAKE these medications   ACIDOPHILUS/CITRUS PECTIN Tabs Take 1 tablet by mouth daily.   albuterol (2.5 MG/3ML) 0.083% nebulizer solution Commonly known as:  PROVENTIL Take 2.5 mg by nebulization every 6 (six) hours as needed for wheezing or shortness of breath.   CALCARB 600/D PO Take 2 tablets by mouth daily.   clopidogrel 75 MG tablet Commonly known as:  PLAVIX Take 1 tablet (75 mg total) by mouth daily with breakfast.   Digoxin 62.5 MCG Tabs Take 0.0625 mg by mouth daily. Start taking on:  09/21/2016   diltiazem 240 MG 24 hr capsule Commonly known as:  CARDIZEM CD Take 1 capsule (240 mg total) by mouth daily.   famciclovir 250 MG tablet Commonly known as:  FAMVIR Take 1 tablet (250 mg total) by mouth daily.   gabapentin 600 MG tablet Commonly known as:  NEURONTIN Take 1 tablet (600 mg total) by mouth 2 (two) times daily. What changed:  when to take this   levofloxacin 750 MG tablet Commonly known as:  LEVAQUIN Take 1 tablet (750 mg total) by mouth every other day. Start taking on:  09/21/2016   metoprolol 100 MG tablet Commonly known as:  LOPRESSOR Take 1 tablet (100 mg total) by mouth 2 (two) times daily.   Morphine Sulfate ER 15 MG T12a Take by mouth 2 (two) times daily.   oxyCODONE 5 MG immediate release tablet Commonly known as:  Oxy IR/ROXICODONE Take 1 tablet (5 mg total) by mouth every 6 (six) hours as needed for breakthrough pain (pain). What changed:  when to take this  additional instructions   pantoprazole 40 MG tablet Commonly known as:  PROTONIX Take 1 tablet (40 mg total) by mouth daily.   potassium chloride SA 20 MEQ tablet Commonly known  as:  K-DUR,KLOR-CON Take 1 tablet (20 mEq total) by mouth 2 (two) times daily.   predniSONE 10 MG tablet Commonly known as:  DELTASONE Take 4 tabs for 3 days, then 3 tabs for 3 days, then 2 tabs for 3 days, then 1 tab for 3 days, then 1/2 tab for 4 days. What changed:  medication strength  additional instructions   sodium chloride 0.65 % Soln nasal spray Commonly known as:  OCEAN Place 1 spray into both nostrils 3 (three) times daily.   SYSTANE OP Place 1 drop into both eyes 2 (two) times daily.   torsemide 20 MG tablet Commonly known as:  DEMADEX Take 80 mg by mouth daily.      Follow-up [facility], [place]. Schedule an appointment as soon as possible for a visit in 1 week(s).   Specialty:  General Surgery Why:  to have unna boots removed and to have wounds evaluated Contact information: [place] [employer] [place], [place]. Schedule an appointment as soon as possible for  a visit in 1 week(s).   Specialty:  Internal Medicine Contact information: [address] [place] [address] [phone]          Allergies  Allergen Reactions  . Keflex [Cephalexin] Other (See Comments)    intolerance    Consultations:  General surgery    Procedures/Studies: Dg Chest Port 1 View  Result Date: 09/13/2016 CLINICAL DATA:  83 year old female with shortness of breath, sepsis. Initial encounter. EXAM: PORTABLE CHEST 1 VIEW COMPARISON:  09/12/2016 and earlier. FINDINGS: Portable AP semi upright view at 0644 hours. Stable cardiac size and mediastinal contours. Mid thoracic spinal stimulator device re- demonstrated. Partially visible lumbar fusion hardware. Stable lung volumes. Mild to moderate coarse and streaky bilateral perihilar pulmonary opacity. As before this is most confluent about the left hilum. No pneumothorax or pleural effusion. Ventilation has  mildly improved since yesterday. IMPRESSION: 1. Improved ventilation since yesterday with residual coarse interstitial and streaky pulmonary opacity. Favor acute infection superimposed on atelectasis and vascular congestion. 2. No pleural effusion or new cardiopulmonary abnormality. Electronically Signed   By: Genevie Ann M.D.   On: 09/13/2016 08:02   Dg Chest Port 1 View  Result Date: 09/12/2016 CLINICAL DATA:  Shortness of Breath EXAM: PORTABLE CHEST 1 VIEW COMPARISON:  Study obtained earlier in the day FINDINGS: There is generalized interstitial pulmonary edema. Patchy airspace opacity in the left mid lung is stable. No new opacity is evident. No evident pneumothorax. There is cardiomegaly with pulmonary venous hypertension. No evident adenopathy. Stimulator is present with tip in mid thoracic region. There is postoperative change in the lumbar region. IMPRESSION: Evidence fluid with a degree of congestive heart failure with questionable superimposed pneumonia left mid lung, stable. There is cardiomegaly with pulmonary venous hypertension. The overall appearance is stable compared to earlier in the day. Electronically Signed   By: Lowella Grip III M.D.   On: 09/12/2016 12:41   Dg Chest Port 1 View  Result Date: 09/12/2016 CLINICAL DATA:  Shortness of breath.  Code sepsis. EXAM: PORTABLE CHEST 1 VIEW COMPARISON:  Radiographs 08/09/2013 FINDINGS: The heart is enlarged. Unchanged tortuosity of the thoracic aorta. There is cephalization of pulmonary vasculature in peribronchial cuffing concerning for pulmonary edema. Small focal opacity in the left midlung zone. No large pleural effusion. No pneumothorax. Spinal stimulator and postsurgical change in the spine. Advanced degenerative change of both shoulders. IMPRESSION: 1. Patchy focal airspace opacity left midlung zone, concerning for pneumonia. 2. Cardiomegaly. Vascular congestion and probable pulmonary edema. Suspect an element of CHF. Electronically  Signed   By: Jeb Levering M.D.   On: 09/12/2016 05:48    Echo Study Conclusions  - Left ventricle: The cavity size was normal. Wall thickness was   increased increased in a pattern of mild to moderate LVH.   Systolic function was normal. The estimated ejection fraction was   in the range of 60% to 65%. Wall motion was normal; there were no   regional wall motion abnormalities. - Aortic valve: There was mild regurgitation. - Mitral valve: Mildly calcified leaflets . - Left atrium: The atrium was moderately to severely dilated. - Right atrium: The atrium was dilated. - Tricuspid valve: There was moderate regurgitation. - Pulmonary arteries: Systolic pressure was mildly to moderately   increased. PA peak pressure: 46 mm Hg (S).   Discharge Exam: Vitals:   09/20/16 0800 09/20/16 1138  BP: (!) 176/63 (!) 171/87  Pulse: (!) 58   Resp: 18   Temp:  97.8  F (36.6 C)   Vitals:   09/20/16 0616 09/20/16 0617 09/20/16 0800 09/20/16 1138  BP: (!) 153/73 (!) 153/73 (!) 176/63 (!) 171/87  Pulse: (!) 121 (!) 51 (!) 58   Resp: _0 Temp:    97.8 F (36.6 C)  TempSrc:    Oral  SpO2: 91% 92% 93%   Weight:      Height:        General: Pt is alert, awake, not in acute distress Cardiovascular: Irregular, rate 80s, S1/S2 +, no rubs, no gallops Respiratory: CTA bilaterally, no wheezing, no rhonchi Abdominal: Soft, NT, ND, bowel sounds + Extremities: +bilateral edema, UNNA boot in place     The results of significant diagnostics from this hospitalization (including imaging, microbiology, ancillary and laboratory) are listed below for reference.     Microbiology: Recent Results (from the past 240 hour(s))  Blood culture (routine x 2)     Status: None   Collection Time: 09/12/16  4:15 AM  Result Value Ref Range Status   Specimen Description BLOOD LEFT ARM  Final   Special Requests BOTTLES DRAWN AEROBIC AND ANAEROBIC 10ML  Final   Culture NO GROWTH 5 DAYS  Final   Report  Status 09/17/2016 FINAL  Final  Blood culture (routine x 2)     Status: None   Collection Time: 09/12/16  4:25 AM  Result Value Ref Range Status   Specimen Description BLOOD LEFT HAND  Final   Special Requests IN PEDIATRIC BOTTLE 3ML  Final   Culture NO GROWTH 5 DAYS  Final   Report Status 09/17/2016 FINAL  Final  Urine culture     Status: Abnormal   Collection Time: 09/12/16 11:10 AM  Result Value Ref Range Status   Specimen Description URINE, CATHETERIZED  Final   Special Requests NONE  Final   Culture >=100,000 COLONIES/mL KLEBSIELLA PNEUMONIAE (A)  Final   Report Status 09/14/2016 FINAL  Final   Organism ID, Bacteria KLEBSIELLA PNEUMONIAE (A)  Final      Susceptibility   Klebsiella pneumoniae - MIC*    AMPICILLIN 16 RESISTANT Resistant     CEFAZOLIN <=4 SENSITIVE Sensitive     CEFTRIAXONE <=1 SENSITIVE Sensitive     CIPROFLOXACIN <=0.25 SENSITIVE Sensitive     GENTAMICIN <=1 SENSITIVE Sensitive     IMIPENEM <=0.25 SENSITIVE Sensitive     NITROFURANTOIN 64 INTERMEDIATE Intermediate     TRIMETH/SULFA <=20 SENSITIVE Sensitive     AMPICILLIN/SULBACTAM 4 SENSITIVE Sensitive     PIP/TAZO <=4 SENSITIVE Sensitive     Extended ESBL NEGATIVE Sensitive     * >=100,000 COLONIES/mL KLEBSIELLA PNEUMONIAE  MRSA PCR Screening     Status: Abnormal   Collection Time: 09/12/16  6:59 PM  Result Value Ref Range Status   MRSA by PCR POSITIVE (A) NEGATIVE Final    Comment:        The GeneXpert MRSA Assay (FDA approved for NASAL specimens only), is one component of a comprehensive MRSA colonization surveillance program. It is not intended to diagnose MRSA infection nor to guide or monitor treatment for MRSA infections. RESULT CALLED TO, READ BACK BY AND VERIFIED WITH: M.WHITE,RN AT 2345 BY L.PITT 09/12/16      Labs: BNP (last 3 results)  Recent Labs  09/12/16 0417  BNP 2,811.8*   Basic Metabolic Panel:  Recent Labs Lab 09/15/16 0842 09/16/16 0556 09/17/16 1155  09/17/16 1437 09/18/16 0312  NA 137 138 144  --  139  K 4.5 4.3  3.3*  --  4.1  CL 107 107 108  --  102  CO2 _0 --  29  GLUCOSE 153* 120* 181*  --  151*  BUN 33* 27* 21*  --  21*  CREATININE 1.03* 0.99 1.08*  --  0.95  CALCIUM 8.4* 8.5* 8.6*  --  8.5*  MG  --   --   --  2.0  --    Liver Function Tests:  Recent Labs Lab 09/15/16 0842  AST 12*  ALT 8*  ALKPHOS 50  BILITOT 0.6  PROT 5.8*  ALBUMIN 2.2*   No results for input(s): LIPASE, AMYLASE in the last 168 hours. No results for input(s): AMMONIA in the last 168 hours. CBC:  Recent Labs Lab 09/15/16 0842 09/16/16 0556 09/18/16 0312 09/20/16 0331  WBC 11.2* 9.8 11.4* 9.9  NEUTROABS 10.0*  --  9.8* 8.0*  HGB 11.6* 11.2* 12.1 11.7*  HCT 37.7 37.4 38.9 38.3  MCV 89.8 89.7 88.6 89.3  PLT 127* 126* 122* 76*   Cardiac Enzymes: No results for input(s): CKTOTAL, CKMB, CKMBINDEX, TROPONINI in the last 168 hours. BNP: Invalid input(s): POCBNP CBG: No results for input(s): GLUCAP in the last 168 hours. D-Dimer No results for input(s): DDIMER in the last 72 hours. Hgb A1c No results for input(s): HGBA1C in the last 72 hours. Lipid Profile No results for input(s): CHOL, HDL, LDLCALC, TRIG, CHOLHDL, LDLDIRECT in the last 72 hours. Thyroid function studies No results for input(s): TSH, T4TOTAL, T3FREE, THYROIDAB in the last 72 hours.  Invalid input(s): FREET3 Anemia work up No results for input(s): VITAMINB12, FOLATE, FERRITIN, TIBC, IRON, RETICCTPCT in the last 72 hours. Urinalysis    Component Value Date/Time   COLORURINE YELLOW 09/12/2016 0438   APPEARANCEUR CLEAR 09/12/2016 0438   LABSPEC 1.008 09/12/2016 0438   PHURINE 6.0 09/12/2016 0438   GLUCOSEU NEGATIVE 09/12/2016 0438   HGBUR NEGATIVE 09/12/2016 0438   BILIRUBINUR NEGATIVE 09/12/2016 0438   KETONESUR NEGATIVE 09/12/2016 0438   PROTEINUR NEGATIVE 09/12/2016 0438   UROBILINOGEN 0.2 08/10/2013 0037   NITRITE NEGATIVE 09/12/2016 0438    LEUKOCYTESUR TRACE (A) 09/12/2016 0438   Sepsis Labs Invalid input(s): PROCALCITONIN,  WBC,  LACTICIDVEN Microbiology Recent Results (from the past 240 hour(s))  Blood culture (routine x 2)     Status: None   Collection Time: 09/12/16  4:15 AM  Result Value Ref Range Status   Specimen Description BLOOD LEFT ARM  Final   Special Requests BOTTLES DRAWN AEROBIC AND ANAEROBIC 10ML  Final   Culture NO GROWTH 5 DAYS  Final   Report Status 09/17/2016 FINAL  Final  Blood culture (routine x 2)     Status: None   Collection Time: 09/12/16  4:25 AM  Result Value Ref Range Status   Specimen Description BLOOD LEFT HAND  Final   Special Requests IN PEDIATRIC BOTTLE 3ML  Final   Culture NO GROWTH 5 DAYS  Final   Report Status 09/17/2016 FINAL  Final  Urine culture     Status: Abnormal   Collection Time: 09/12/16 11:10 AM  Result Value Ref Range Status   Specimen Description URINE, CATHETERIZED  Final   Special Requests NONE  Final   Culture >=100,000 COLONIES/mL KLEBSIELLA PNEUMONIAE (A)  Final   Report Status 09/14/2016 FINAL  Final   Organism ID, Bacteria KLEBSIELLA PNEUMONIAE (A)  Final      Susceptibility   Klebsiella pneumoniae - MIC*    AMPICILLIN 16 RESISTANT Resistant  CEFAZOLIN <=4 SENSITIVE Sensitive     CEFTRIAXONE <=1 SENSITIVE Sensitive     CIPROFLOXACIN <=0.25 SENSITIVE Sensitive     GENTAMICIN <=1 SENSITIVE Sensitive     IMIPENEM <=0.25 SENSITIVE Sensitive     NITROFURANTOIN 64 INTERMEDIATE Intermediate     TRIMETH/SULFA <=20 SENSITIVE Sensitive     AMPICILLIN/SULBACTAM 4 SENSITIVE Sensitive     PIP/TAZO <=4 SENSITIVE Sensitive     Extended ESBL NEGATIVE Sensitive     * >=100,000 COLONIES/mL KLEBSIELLA PNEUMONIAE  MRSA PCR Screening     Status: Abnormal   Collection Time: 09/12/16  6:59 PM  Result Value Ref Range Status   MRSA by PCR POSITIVE (A) NEGATIVE Final    Comment:        The GeneXpert MRSA Assay (FDA approved for NASAL specimens only), is one component of  a comprehensive MRSA colonization surveillance program. It is not intended to diagnose MRSA infection nor to guide or monitor treatment for MRSA infections. RESULT CALLED TO, READ BACK BY AND VERIFIED WITH: M.WHITE,RN AT 2345 BY L.PITT 09/12/16      Time coordinating discharge: Over 30 minutes  SIGNED:  Dessa Phi, DO [facility] Pager [phone]  If 7PM-7AM, please contact night-coverage [email or web address] Password [email or web address] 09/20/2016, 12:10 PM

## 2016-09-20 NOTE — Progress Notes (Signed)
Patient will discharge to [facility] Anticipated discharge date: 1/4 Family notified:attempted to call son x2- unable to leave message Transportation by [facility]- called at [place] signing off.  Jorge Ny, LCSW Clinical Social Worker [phone]

## 2016-09-20 NOTE — Progress Notes (Signed)
Report called to [facility] RN at [facility]

## 2016-09-20 NOTE — Progress Notes (Signed)
Orthopedic Tech Progress Note Patient Details:  [name] [name] Mar 23, 1931 [id number]  Ortho Devices Type of Ortho Device: Ace wrap, Unna boot Ortho Device/Splint Location: bilateral Ortho Device/Splint Interventions: Application   [place] Pink 09/20/2016, 11:33 AM

## 2016-09-20 NOTE — Progress Notes (Signed)
Patient transported by [facility] to [facility]

## 2016-09-20 NOTE — Progress Notes (Signed)
[facility] Progress Note     Subjective: Pt states intermittent pain in her legs but no pain currently. She states she is ready to go home. No new complaints. No fever or chills.   Objective: Vital signs in last 24 hours: Temp:  [97.8 F (36.6 C)-98.6 F (37 C)] 97.8 F (36.6 C) (01/04 0300) Pulse Rate:  [51-121] 58 (01/04 0800) Resp:  [14-25] 18 (01/04 0800) BP: (152-192)/(63-93) 176/63 (01/04 0800) SpO2:  [91 %-98 %] 93 % (01/04 0800) Weight:  [215 lb 2.7 oz (97.6 kg)] 215 lb 2.7 oz (97.6 kg) (01/04 0357) Last BM Date: 09/19/16  Intake/Output from previous day: 01/03 0701 - 01/04 0700 In: 240 [P.O.:240] Out: 3299 [Urine:4345] Intake/Output this shift: No intake/output data recorded.  PE: Gen:  Alert, NAD, pleasant, cooperative, elderly woman lying in bed Pulm: effort normal Extremities: warm and dry, no edema, Unna boots removed, RLE wound with black eschar noted to lateral aspect, no surrounding erythema or drainage noted, good granulation tissue. LLE wound with scab and no surrounding erythema or drainage. No signs of infection    RLE before debridement   LLE     RLE post bedside debridement   Lab Results:   Recent Labs  09/18/16 0312 09/20/16 0331  WBC 11.4* 9.9  HGB 12.1 11.7*  HCT 38.9 38.3  PLT 122* 76*   BMET  Recent Labs  09/17/16 1155 09/18/16 0312  NA 144 139  K 3.3* 4.1  CL 108 102  CO2 29 29  GLUCOSE 181* 151*  BUN 21* 21*  CREATININE 1.08* 0.95  CALCIUM 8.6* 8.5*   PT/INR No results for input(s): LABPROT, INR in the last 72 hours. CMP     Component Value Date/Time   NA 139 09/18/2016 0312   NA 144 08/29/2016 1442   K 4.1 09/18/2016 0312   K 3.8 08/29/2016 1442   CL 102 09/18/2016 0312   CL 96 (L) 08/29/2016 1442   CO2 29 09/18/2016 0312   CO2 29 08/29/2016 1442   GLUCOSE 151 (H) 09/18/2016 0312   GLUCOSE 174 (H) 08/29/2016 1442   BUN 21 (H) 09/18/2016 0312   BUN 44 (H) 08/29/2016 1442   CREATININE 0.95  09/18/2016 0312   CREATININE 2.0 (H) 08/29/2016 1442   CALCIUM 8.5 (L) 09/18/2016 0312   CALCIUM 9.4 08/29/2016 1442   PROT 5.8 (L) 09/15/2016 0842   PROT 7.1 08/29/2016 1442   ALBUMIN 2.2 (L) 09/15/2016 0842   ALBUMIN 3.4 08/29/2016 1442   AST 12 (L) 09/15/2016 0842   AST 15 08/29/2016 1442   ALT 8 (L) 09/15/2016 0842   ALT 11 08/29/2016 1442   ALKPHOS 50 09/15/2016 0842   ALKPHOS 80 08/29/2016 1442   BILITOT 0.6 09/15/2016 0842   BILITOT 0.80 08/29/2016 1442   GFRNONAA 54 (L) 09/18/2016 0312   GFRAA >60 09/18/2016 0312   Lipase     Component Value Date/Time   LIPASE 12 12/20/2012 1706       Studies/Results: No results found.  Anti-infectives: Anti-infectives    Start     Dose/Rate Route Frequency Ordered Stop   09/15/16 1000  levofloxacin (LEVAQUIN) tablet 750 mg     750 mg Oral Every 48 hours 09/15/16 0905 09/29/16 0959   09/13/16 0500  vancomycin (VANCOCIN) 1,250 mg in sodium chloride 0.9 % 250 mL IVPB  Status:  Discontinued     1,250 mg 166.7 mL/hr over 90 Minutes Intravenous Every 24 hours 09/12/16 0942 09/15/16 0842   09/12/16  1230  ceFEPIme (MAXIPIME) 2 g in dextrose 5 % 50 mL IVPB  Status:  Discontinued     2 g 100 mL/hr over 30 Minutes Intravenous Every 24 hours 09/12/16 0942 09/15/16 0842   09/12/16 1000  valACYclovir (VALTREX) tablet 1,000 mg     1,000 mg Oral Daily 09/12/16 0809     09/12/16 0500  vancomycin (VANCOCIN) IVPB 1000 mg/200 mL premix     1,000 mg 200 mL/hr over 60 Minutes Intravenous  Once 09/12/16 0457 09/12/16 0612   09/12/16 0500  piperacillin-tazobactam (ZOSYN) IVPB 3.375 g     3.375 g 100 mL/hr over 30 Minutes Intravenous  Once 09/12/16 0457 09/12/16 0538       Assessment/Plan   BLE wounds: appear traumatic in origin. No deep necrosis or evidence of ischemia. Likely 2/2 chronic venous insufficiency  PROCEDURE: Bedside Debridement of RLE wound: timeout was preformed. Wound measured roughly 7.5cm in length and 4cm at its widest  point. The area of black eschar was roughly 3.5x4.5cm. The black eschar was removed from wound using small scissors and forceps. Good granulation tissue was noted to wound bed. Wound was cleansed with sterile saline. Wound was dressed with vasiline gauze and wrapped with kerlex. Pt tolerated the procedure well.   Unna boots should be replaced today by ortho tech. Pt can f/u in our office in 1 week to have boots removed and to have wounds evaluated.   We will sign off for now. Page [place] with any questions or concerns.    LOS: 8 days    Kalman Drape , [facility] 09/20/2016, 9:47 AM Pager: [phone] Consults: [phone] Mon-Fri 7:00 am-4:30 pm Sat-Sun 7:00 am-11:30 am

## 2016-09-28 ENCOUNTER — Encounter (HOSPITAL_BASED_OUTPATIENT_CLINIC_OR_DEPARTMENT_OTHER): Payer: Medicare Other | Attending: Internal Medicine

## 2016-09-28 DIAGNOSIS — L97211 Non-pressure chronic ulcer of right calf limited to breakdown of skin: Secondary | ICD-10-CM | POA: Diagnosis not present

## 2016-09-28 DIAGNOSIS — I87323 Chronic venous hypertension (idiopathic) with inflammation of bilateral lower extremity: Secondary | ICD-10-CM | POA: Insufficient documentation

## 2016-09-28 DIAGNOSIS — L97221 Non-pressure chronic ulcer of left calf limited to breakdown of skin: Secondary | ICD-10-CM | POA: Diagnosis not present

## 2016-10-01 ENCOUNTER — Ambulatory Visit (HOSPITAL_BASED_OUTPATIENT_CLINIC_OR_DEPARTMENT_OTHER): Payer: Medicare Other

## 2016-10-01 ENCOUNTER — Ambulatory Visit (HOSPITAL_BASED_OUTPATIENT_CLINIC_OR_DEPARTMENT_OTHER): Payer: Medicare Other | Admitting: Family

## 2016-10-01 ENCOUNTER — Other Ambulatory Visit (HOSPITAL_BASED_OUTPATIENT_CLINIC_OR_DEPARTMENT_OTHER): Payer: Medicare Other

## 2016-10-01 DIAGNOSIS — D693 Immune thrombocytopenic purpura: Secondary | ICD-10-CM | POA: Diagnosis present

## 2016-10-01 DIAGNOSIS — I739 Peripheral vascular disease, unspecified: Secondary | ICD-10-CM

## 2016-10-01 DIAGNOSIS — I872 Venous insufficiency (chronic) (peripheral): Secondary | ICD-10-CM | POA: Diagnosis not present

## 2016-10-01 DIAGNOSIS — D696 Thrombocytopenia, unspecified: Secondary | ICD-10-CM

## 2016-10-01 LAB — CMP (CANCER CENTER ONLY)
ALBUMIN: 2.7 g/dL — AB (ref 3.3–5.5)
ALT(SGPT): 15 U/L (ref 10–47)
AST: 17 U/L (ref 11–38)
Alkaline Phosphatase: 60 U/L (ref 26–84)
BUN, Bld: 33 mg/dL — ABNORMAL HIGH (ref 7–22)
CALCIUM: 8.8 mg/dL (ref 8.0–10.3)
CHLORIDE: 99 meq/L (ref 98–108)
CO2: 29 mEq/L (ref 18–33)
Creat: 1 mg/dl (ref 0.6–1.2)
Glucose, Bld: 203 mg/dL — ABNORMAL HIGH (ref 73–118)
POTASSIUM: 4.4 meq/L (ref 3.3–4.7)
Sodium: 139 mEq/L (ref 128–145)
Total Bilirubin: 1 mg/dl (ref 0.20–1.60)
Total Protein: 6.9 g/dL (ref 6.4–8.1)

## 2016-10-01 LAB — CBC WITH DIFFERENTIAL (CANCER CENTER ONLY)
BASO#: 0 10*3/uL (ref 0.0–0.2)
BASO%: 0.2 % (ref 0.0–2.0)
EOS ABS: 0.1 10*3/uL (ref 0.0–0.5)
EOS%: 0.4 % (ref 0.0–7.0)
HEMATOCRIT: 41.2 % (ref 34.8–46.6)
HEMOGLOBIN: 12.9 g/dL (ref 11.6–15.9)
LYMPH#: 0.8 10*3/uL — AB (ref 0.9–3.3)
LYMPH%: 4.2 % — ABNORMAL LOW (ref 14.0–48.0)
MCH: 28.8 pg (ref 26.0–34.0)
MCHC: 31.3 g/dL — AB (ref 32.0–36.0)
MCV: 92 fL (ref 81–101)
MONO#: 0.2 10*3/uL (ref 0.1–0.9)
MONO%: 1.1 % (ref 0.0–13.0)
NEUT%: 94.1 % — AB (ref 39.6–80.0)
NEUTROS ABS: 17 10*3/uL — AB (ref 1.5–6.5)
Platelets: 16 10*3/uL — ABNORMAL LOW (ref 145–400)
RBC: 4.48 10*6/uL (ref 3.70–5.32)
RDW: 18.7 % — AB (ref 11.1–15.7)
WBC: 18 10*3/uL — ABNORMAL HIGH (ref 3.9–10.0)

## 2016-10-01 LAB — TECHNOLOGIST REVIEW CHCC SATELLITE

## 2016-10-01 MED ORDER — ROMIPLOSTIM 250 MCG ~~LOC~~ SOLR
1.0000 ug/kg | Freq: Once | SUBCUTANEOUS | Status: AC
  Administered 2016-10-01: 90 ug via SUBCUTANEOUS
  Filled 2016-10-01: qty 0.18

## 2016-10-01 NOTE — Progress Notes (Signed)
Hematology and Oncology Follow Up Visit  [name] [name] [id number] 02-04-1931 83 y.o. 10/01/2016   Principle Diagnosis:  ITP  Current Therapy:   Observation    Interim History:  [name] [name] is here alone today for follow-up. At her last visit, we she had completed her Predisone and platelet count was stable at 77. Her count has now dropped back down to 17. Hgb is stable at 12.9 with an MCV of 92.  She denies having any episodes of bleeding, bruising or petechiae.  She states that she does feel quite fatigued.  No fever, chills, n/v, cough, rash, dizziness, SOB, chest pain, palpitations, abdominal pain or changes in bowel or bladder habits.  Patient has PVD with severe venous insufficiency and swelling in both legs. Both lower extremities are wrapped with ace wraps. She states that her previous lower extremity injuries from bumping her legs while in a wheelchair continue to heal.  No numbness or tingling in her extremities. She states that she has maintained a good appetite and is staying well hydrated.   Medications:  Allergies as of 10/01/2016      Reactions   Keflex [cephalexin] Other (See Comments)   intolerance      Medication List       Accurate as of 10/01/16  3:09 PM. Always use your most recent med list.          ACIDOPHILUS/CITRUS PECTIN Tabs Take 1 tablet by mouth daily.   albuterol (2.5 MG/3ML) 0.083% nebulizer solution Commonly known as:  PROVENTIL Take 2.5 mg by nebulization every 6 (six) hours as needed for wheezing or shortness of breath.   CALCARB 600/D PO Take 2 tablets by mouth daily.   clopidogrel 75 MG tablet Commonly known as:  PLAVIX Take 1 tablet (75 mg total) by mouth daily with breakfast.   Digoxin 62.5 MCG Tabs Take 0.0625 mg by mouth daily.   diltiazem 240 MG 24 hr capsule Commonly known as:  CARDIZEM CD Take 1 capsule (240 mg total) by mouth daily.   famciclovir 250 MG tablet Commonly known as:  FAMVIR Take 1 tablet (250 mg total)  by mouth daily.   gabapentin 600 MG tablet Commonly known as:  NEURONTIN Take 1 tablet (600 mg total) by mouth 2 (two) times daily.   metoprolol 100 MG tablet Commonly known as:  LOPRESSOR Take 1 tablet (100 mg total) by mouth 2 (two) times daily.   Morphine Sulfate ER 15 MG T12a Take by mouth 2 (two) times daily.   oxyCODONE 5 MG immediate release tablet Commonly known as:  Oxy IR/ROXICODONE Take 1 tablet (5 mg total) by mouth every 6 (six) hours as needed for breakthrough pain (pain).   pantoprazole 40 MG tablet Commonly known as:  PROTONIX Take 1 tablet (40 mg total) by mouth daily.   potassium chloride SA 20 MEQ tablet Commonly known as:  K-DUR,KLOR-CON Take 1 tablet (20 mEq total) by mouth 2 (two) times daily.   predniSONE 10 MG tablet Commonly known as:  DELTASONE Take 4 tabs for 3 days, then 3 tabs for 3 days, then 2 tabs for 3 days, then 1 tab for 3 days, then 1/2 tab for 4 days.   sodium chloride 0.65 % Soln nasal spray Commonly known as:  OCEAN Place 1 spray into both nostrils 3 (three) times daily.   SYSTANE OP Place 1 drop into both eyes 2 (two) times daily.   torsemide 20 MG tablet Commonly known as:  DEMADEX Take 80 mg by  mouth daily.       Allergies:  Allergies  Allergen Reactions  . Keflex [Cephalexin] Other (See Comments)    intolerance    Past Medical History, Surgical history, Social history, and Family History were reviewed and updated.  Review of Systems: All other 10 point review of systems is negative.   Physical Exam:  vitals were not taken for this visit.  Wt Readings from Last 3 Encounters:  09/20/16 215 lb 2.7 oz (97.6 kg)  09/05/16 199 lb (90.3 kg)  08/29/16 199 lb (90.3 kg)    Ocular: Sclerae unicteric, pupils equal, round and reactive to light Ear-nose-throat: Oropharynx clear, dentition fair Lymphatic: No cervical supraclavicular or axillary adenopathy Lungs no rales or rhonchi, good excursion bilaterally Heart regular  rate and rhythm, no murmur appreciated Abd soft, nontender, positive bowel sounds, no liver or spleen tip palpated on exam, no fluid wave  MSK no focal spinal tenderness, no joint edema Neuro: non-focal, well-oriented, appropriate affect Breasts: Deferred  Lab Results  Component Value Date   WBC 9.9 09/20/2016   HGB 11.7 (L) 09/20/2016   HCT 38.3 09/20/2016   MCV 89.3 09/20/2016   PLT 76 (L) 09/20/2016   No results found for: FERRITIN, IRON, TIBC, UIBC, IRONPCTSAT Lab Results  Component Value Date   RBC 4.29 09/20/2016   No results found for: KPAFRELGTCHN, LAMBDASER, KAPLAMBRATIO No results found for: IGGSERUM, IGA, IGMSERUM No results found for: Odetta Pink, SPEI   Chemistry      Component Value Date/Time   NA 139 09/18/2016 0312   NA 144 08/29/2016 1442   K 4.1 09/18/2016 0312   K 3.8 08/29/2016 1442   CL 102 09/18/2016 0312   CL 96 (L) 08/29/2016 1442   CO2 29 09/18/2016 0312   CO2 29 08/29/2016 1442   BUN 21 (H) 09/18/2016 0312   BUN 44 (H) 08/29/2016 1442   CREATININE 0.95 09/18/2016 0312   CREATININE 2.0 (H) 08/29/2016 1442      Component Value Date/Time   CALCIUM 8.5 (L) 09/18/2016 0312   CALCIUM 9.4 08/29/2016 1442   ALKPHOS 50 09/15/2016 0842   ALKPHOS 80 08/29/2016 1442   AST 12 (L) 09/15/2016 0842   AST 15 08/29/2016 1442   ALT 8 (L) 09/15/2016 0842   ALT 11 08/29/2016 1442   BILITOT 0.6 09/15/2016 0842   BILITOT 0.80 08/29/2016 1442     Impression and Plan: [name] [name] is a very pleasant 83 yo female with history of ITP. She completed treatment with prednisone and has now relapsed. Platelet count has dropped to 17. Thankfully, she is asymptomatic with this and denies having any episodes of bleeding, bruising or petechiae. Hgb is stable at 12.9 with an MCV of 92.  We will now have her start Nplate and received her first dose today.  She will continue on Famvir and Protonix daily.  We will see her  back in 1 week for repeat lab work and follow-up.  Her living facility will contact us with any questions or concerns. We can certainly see her sooner if need be.   Eliezer Bottom, NP 1/15/20183:09 PM

## 2016-10-01 NOTE — Patient Instructions (Signed)
Romiplostim injection What is this medicine? ROMIPLOSTIM (roe mi PLOE stim) helps your body make more platelets. This medicine is used to treat low platelets caused by chronic idiopathic thrombocytopenic purpura (ITP). COMMON BRAND NAME(S): Nplate What should I tell my health care provider before I take this medicine? They need to know if you have any of these conditions: -cancer or myelodysplastic syndrome -low blood counts, like low white cell, platelet, or red cell counts -take medicines that treat or prevent blood clots -an unusual or allergic reaction to romiplostim, mannitol, other medicines, foods, dyes, or preservatives -pregnant or trying to get pregnant -breast-feeding How should I use this medicine? This medicine is for injection under the skin. It is given by a health care professional in a hospital or clinic setting. A special MedGuide will be given to you before your injection. Read this information carefully each time. Talk to your pediatrician regarding the use of this medicine in children. Special care may be needed. What if I miss a dose? It is important not to miss your dose. Call your doctor or health care professional if you are unable to keep an appointment. What may interact with this medicine? Interactions are not expected. What should I watch for while using this medicine? Your condition will be monitored carefully while you are receiving this medicine. Visit your prescriber or health care professional for regular checks on your progress and for the needed blood tests. It is important to keep all appointments. What side effects may I notice from receiving this medicine? Side effects that you should report to your doctor or health care professional as soon as possible: -allergic reactions like skin rash, itching or hives, swelling of the face, lips, or tongue -shortness of breath, chest pain, swelling in a leg -unusual bleeding or bruising Side effects that usually  do not require medical attention (report to your doctor or health care professional if they continue or are bothersome): -dizziness -headache -muscle aches -pain in arms and legs -stomach pain -trouble sleeping Where should I keep my medicine? This drug is given in a hospital or clinic and will not be stored at home.  2017 Elsevier/Gold Standard (2008-05-03 15:13:04)  

## 2016-10-02 ENCOUNTER — Encounter (HOSPITAL_BASED_OUTPATIENT_CLINIC_OR_DEPARTMENT_OTHER): Payer: Medicare Other

## 2016-10-05 DIAGNOSIS — I87323 Chronic venous hypertension (idiopathic) with inflammation of bilateral lower extremity: Secondary | ICD-10-CM | POA: Diagnosis not present

## 2016-10-08 ENCOUNTER — Ambulatory Visit (HOSPITAL_BASED_OUTPATIENT_CLINIC_OR_DEPARTMENT_OTHER): Payer: Medicare Other | Admitting: Family

## 2016-10-08 ENCOUNTER — Other Ambulatory Visit (HOSPITAL_BASED_OUTPATIENT_CLINIC_OR_DEPARTMENT_OTHER): Payer: Medicare Other

## 2016-10-08 ENCOUNTER — Ambulatory Visit (HOSPITAL_BASED_OUTPATIENT_CLINIC_OR_DEPARTMENT_OTHER): Payer: Medicare Other

## 2016-10-08 VITALS — BP 119/95 | HR 97 | Temp 98.6°F | Resp 18

## 2016-10-08 DIAGNOSIS — D696 Thrombocytopenia, unspecified: Secondary | ICD-10-CM

## 2016-10-08 DIAGNOSIS — D693 Immune thrombocytopenic purpura: Secondary | ICD-10-CM | POA: Diagnosis present

## 2016-10-08 LAB — CMP (CANCER CENTER ONLY)
ALT: 16 U/L (ref 10–47)
AST: 16 U/L (ref 11–38)
Albumin: 3 g/dL — ABNORMAL LOW (ref 3.3–5.5)
Alkaline Phosphatase: 66 U/L (ref 26–84)
BILIRUBIN TOTAL: 1.1 mg/dL (ref 0.20–1.60)
BUN: 32 mg/dL — AB (ref 7–22)
CO2: 30 meq/L (ref 18–33)
CREATININE: 1.1 mg/dL (ref 0.6–1.2)
Calcium: 9 mg/dL (ref 8.0–10.3)
Chloride: 98 mEq/L (ref 98–108)
GLUCOSE: 205 mg/dL — AB (ref 73–118)
Potassium: 3.4 mEq/L (ref 3.3–4.7)
SODIUM: 141 meq/L (ref 128–145)
Total Protein: 6.8 g/dL (ref 6.4–8.1)

## 2016-10-08 LAB — CBC WITH DIFFERENTIAL (CANCER CENTER ONLY)
BASO#: 0 10*3/uL (ref 0.0–0.2)
BASO%: 0.4 % (ref 0.0–2.0)
EOS%: 3.1 % (ref 0.0–7.0)
Eosinophils Absolute: 0.3 10*3/uL (ref 0.0–0.5)
HCT: 42.6 % (ref 34.8–46.6)
HGB: 13 g/dL (ref 11.6–15.9)
LYMPH#: 1 10*3/uL (ref 0.9–3.3)
LYMPH%: 9.3 % — ABNORMAL LOW (ref 14.0–48.0)
MCH: 28.1 pg (ref 26.0–34.0)
MCHC: 30.5 g/dL — AB (ref 32.0–36.0)
MCV: 92 fL (ref 81–101)
MONO#: 0.5 10*3/uL (ref 0.1–0.9)
MONO%: 4.2 % (ref 0.0–13.0)
NEUT%: 83 % — ABNORMAL HIGH (ref 39.6–80.0)
NEUTROS ABS: 8.8 10*3/uL — AB (ref 1.5–6.5)
Platelets: 41 10*3/uL — ABNORMAL LOW (ref 145–400)
RBC: 4.63 10*6/uL (ref 3.70–5.32)
RDW: 17.8 % — AB (ref 11.1–15.7)
WBC: 10.6 10*3/uL — ABNORMAL HIGH (ref 3.9–10.0)

## 2016-10-08 LAB — TECHNOLOGIST REVIEW CHCC SATELLITE

## 2016-10-08 MED ORDER — ROMIPLOSTIM 250 MCG ~~LOC~~ SOLR
2.0000 ug/kg | Freq: Once | SUBCUTANEOUS | Status: AC
  Administered 2016-10-08: 175 ug via SUBCUTANEOUS
  Filled 2016-10-08: qty 0.35

## 2016-10-08 NOTE — Patient Instructions (Signed)
Romiplostim injection What is this medicine? ROMIPLOSTIM (roe mi PLOE stim) helps your body make more platelets. This medicine is used to treat low platelets caused by chronic idiopathic thrombocytopenic purpura (ITP). COMMON BRAND NAME(S): Nplate What should I tell my health care provider before I take this medicine? They need to know if you have any of these conditions: -cancer or myelodysplastic syndrome -low blood counts, like low white cell, platelet, or red cell counts -take medicines that treat or prevent blood clots -an unusual or allergic reaction to romiplostim, mannitol, other medicines, foods, dyes, or preservatives -pregnant or trying to get pregnant -breast-feeding How should I use this medicine? This medicine is for injection under the skin. It is given by a health care professional in a hospital or clinic setting. A special MedGuide will be given to you before your injection. Read this information carefully each time. Talk to your pediatrician regarding the use of this medicine in children. Special care may be needed. What if I miss a dose? It is important not to miss your dose. Call your doctor or health care professional if you are unable to keep an appointment. What may interact with this medicine? Interactions are not expected. What should I watch for while using this medicine? Your condition will be monitored carefully while you are receiving this medicine. Visit your prescriber or health care professional for regular checks on your progress and for the needed blood tests. It is important to keep all appointments. What side effects may I notice from receiving this medicine? Side effects that you should report to your doctor or health care professional as soon as possible: -allergic reactions like skin rash, itching or hives, swelling of the face, lips, or tongue -shortness of breath, chest pain, swelling in a leg -unusual bleeding or bruising Side effects that usually  do not require medical attention (report to your doctor or health care professional if they continue or are bothersome): -dizziness -headache -muscle aches -pain in arms and legs -stomach pain -trouble sleeping Where should I keep my medicine? This drug is given in a hospital or clinic and will not be stored at home.  2017 Elsevier/Gold Standard (2008-05-03 15:13:04)

## 2016-10-08 NOTE — Progress Notes (Signed)
Hematology and Oncology Follow Up Visit  TZIPORAH KNOKE [id number] 19-Aug-1931 83 y.o. 10/08/2016   Principle Diagnosis:  ITP - relapsed  Current Therapy:   Nplate as indicated    Interim History:  Ms. Enyeart is here alone today for follow-up. She received her first dose of Nplate last week for a platelet count of 17. She had relapsed after finishing prednisone. Her count is now 41. Hgb is stable. She has had no episodes of bleeding. She has noticed that she bruises easily and has had a few petechial spots on her arms.  No fever, chills, n/v, cough, rash, dizziness, SOB, chest pain, palpitations, abdominal pain or changes in bowel or bladder habits.  Patient has PVD with severe venous insufficiency and swelling in both legs. Both lower extremities are wrapped with ace wraps due to previous lower extremity injuries from bumping her legs while in a wheelchair continue to heal. She is followed by wound care on Fridays. No numbness or tingling in her extremities. She states that she is eating well and staying hydrated.   Medications:  Allergies as of 10/08/2016      Reactions   Keflex [cephalexin] Other (See Comments)   intolerance      Medication List       Accurate as of 10/08/16 12:08 PM. Always use your most recent med list.          ACIDOPHILUS/CITRUS PECTIN Tabs Take 1 tablet by mouth daily.   albuterol (2.5 MG/3ML) 0.083% nebulizer solution Commonly known as:  PROVENTIL Take 2.5 mg by nebulization every 6 (six) hours as needed for wheezing or shortness of breath.   CALCARB 600/D PO Take 2 tablets by mouth daily.   clopidogrel 75 MG tablet Commonly known as:  PLAVIX Take 1 tablet (75 mg total) by mouth daily with breakfast.   Digoxin 62.5 MCG Tabs Take 0.0625 mg by mouth daily.   diltiazem 240 MG 24 hr capsule Commonly known as:  CARDIZEM CD Take 1 capsule (240 mg total) by mouth daily.   famciclovir 250 MG tablet Commonly known as:  FAMVIR Take 1 tablet (250  mg total) by mouth daily.   gabapentin 600 MG tablet Commonly known as:  NEURONTIN Take 1 tablet (600 mg total) by mouth 2 (two) times daily.   metoprolol 100 MG tablet Commonly known as:  LOPRESSOR Take 1 tablet (100 mg total) by mouth 2 (two) times daily.   Morphine Sulfate ER 15 MG T12a Take by mouth 2 (two) times daily.   oxyCODONE 5 MG immediate release tablet Commonly known as:  Oxy IR/ROXICODONE Take 1 tablet (5 mg total) by mouth every 6 (six) hours as needed for breakthrough pain (pain).   pantoprazole 40 MG tablet Commonly known as:  PROTONIX Take 1 tablet (40 mg total) by mouth daily.   potassium chloride SA 20 MEQ tablet Commonly known as:  K-DUR,KLOR-CON Take 1 tablet (20 mEq total) by mouth 2 (two) times daily.   predniSONE 10 MG tablet Commonly known as:  DELTASONE Take 4 tabs for 3 days, then 3 tabs for 3 days, then 2 tabs for 3 days, then 1 tab for 3 days, then 1/2 tab for 4 days.   sodium chloride 0.65 % Soln nasal spray Commonly known as:  OCEAN Place 1 spray into both nostrils 3 (three) times daily.   SYSTANE OP Place 1 drop into both eyes 2 (two) times daily.   torsemide 20 MG tablet Commonly known as:  DEMADEX Take 80 mg  by mouth daily.       Allergies:  Allergies  Allergen Reactions  . Keflex [Cephalexin] Other (See Comments)    intolerance    Past Medical History, Surgical history, Social history, and Family History were reviewed and updated.  Review of Systems: All other 10 point review of systems is negative.   Physical Exam:  oral temperature is 98.6 F (37 C). Her blood pressure is 119/95 (abnormal) and her pulse is 97. Her respiration is 18 and oxygen saturation is 92%.   Wt Readings from Last 3 Encounters:  10/01/16 195 lb (88.5 kg)  09/20/16 215 lb 2.7 oz (97.6 kg)  09/05/16 199 lb (90.3 kg)    Ocular: Sclerae unicteric, pupils equal, round and reactive to light Ear-nose-throat: Oropharynx clear, dentition  fair Lymphatic: No cervical supraclavicular or axillary adenopathy Lungs no rales or rhonchi, good excursion bilaterally Heart regular rate and rhythm, no murmur appreciated Abd soft, nontender, positive bowel sounds, no liver or spleen tip palpated on exam, no fluid wave  MSK no focal spinal tenderness, no joint edema Neuro: non-focal, well-oriented, appropriate affect Breasts: Deferred  Lab Results  Component Value Date   WBC 10.6 (H) 10/08/2016   HGB 13.0 10/08/2016   HCT 42.6 10/08/2016   MCV 92 10/08/2016   PLT 41 Platelet count consistent in citrate (L) 10/08/2016   No results found for: FERRITIN, IRON, TIBC, UIBC, IRONPCTSAT Lab Results  Component Value Date   RBC 4.63 10/08/2016   No results found for: KPAFRELGTCHN, LAMBDASER, KAPLAMBRATIO No results found for: IGGSERUM, IGA, IGMSERUM No results found for: Ronnald Ramp, A1GS, A2GS, Tillman Sers, SPEI   Chemistry      Component Value Date/Time   NA 141 10/08/2016 1052   K 3.4 10/08/2016 1052   CL 98 10/08/2016 1052   CO2 30 10/08/2016 1052   BUN 32 (H) 10/08/2016 1052   CREATININE 1.1 10/08/2016 1052      Component Value Date/Time   CALCIUM 9.0 10/08/2016 1052   ALKPHOS 66 10/08/2016 1052   AST 16 10/08/2016 1052   ALT 16 10/08/2016 1052   BILITOT 1.10 10/08/2016 1052     Impression and Plan: Ms. Homer is a very pleasant 83 yo female with history of ITP. She completed treatment with prednisone and has now relapsed. She is showing improvement after receiving Nplate last week. Her platelet count is now 41. Thankfully, she has had no bleeding. She does have several small bruises and petechiae on her arms.  She will receive another dose of Nplate today and will continue on Famvir and Protonix daily.  We will see her back in 1 week for repeat lab work, follow-up and injection.  Her living facility will contact us with any questions or concerns. We can certainly see her sooner if need be.    Eliezer Bottom, NP 1/22/201812:08 PM

## 2016-10-11 DIAGNOSIS — I87323 Chronic venous hypertension (idiopathic) with inflammation of bilateral lower extremity: Secondary | ICD-10-CM | POA: Diagnosis not present

## 2016-10-15 ENCOUNTER — Ambulatory Visit (HOSPITAL_BASED_OUTPATIENT_CLINIC_OR_DEPARTMENT_OTHER): Payer: Medicare Other | Admitting: Family

## 2016-10-15 ENCOUNTER — Other Ambulatory Visit (HOSPITAL_BASED_OUTPATIENT_CLINIC_OR_DEPARTMENT_OTHER): Payer: Medicare Other

## 2016-10-15 ENCOUNTER — Ambulatory Visit (HOSPITAL_BASED_OUTPATIENT_CLINIC_OR_DEPARTMENT_OTHER): Payer: Medicare Other

## 2016-10-15 VITALS — BP 118/64 | HR 104 | Temp 98.8°F | Resp 22

## 2016-10-15 DIAGNOSIS — I739 Peripheral vascular disease, unspecified: Secondary | ICD-10-CM | POA: Diagnosis not present

## 2016-10-15 DIAGNOSIS — D696 Thrombocytopenia, unspecified: Secondary | ICD-10-CM

## 2016-10-15 DIAGNOSIS — I872 Venous insufficiency (chronic) (peripheral): Secondary | ICD-10-CM

## 2016-10-15 DIAGNOSIS — D693 Immune thrombocytopenic purpura: Secondary | ICD-10-CM | POA: Diagnosis present

## 2016-10-15 LAB — CMP (CANCER CENTER ONLY)
ALBUMIN: 3 g/dL — AB (ref 3.3–5.5)
ALT(SGPT): 12 U/L (ref 10–47)
AST: 18 U/L (ref 11–38)
Alkaline Phosphatase: 59 U/L (ref 26–84)
BILIRUBIN TOTAL: 1.3 mg/dL (ref 0.20–1.60)
BUN, Bld: 34 mg/dL — ABNORMAL HIGH (ref 7–22)
CO2: 30 meq/L (ref 18–33)
CREATININE: 1.1 mg/dL (ref 0.6–1.2)
Calcium: 8.8 mg/dL (ref 8.0–10.3)
Chloride: 98 mEq/L (ref 98–108)
GLUCOSE: 221 mg/dL — AB (ref 73–118)
Potassium: 4 mEq/L (ref 3.3–4.7)
SODIUM: 141 meq/L (ref 128–145)
Total Protein: 6.8 g/dL (ref 6.4–8.1)

## 2016-10-15 LAB — CBC WITH DIFFERENTIAL (CANCER CENTER ONLY)
BASO#: 0 10*3/uL (ref 0.0–0.2)
BASO%: 0.3 % (ref 0.0–2.0)
EOS ABS: 0.2 10*3/uL (ref 0.0–0.5)
EOS%: 1.2 % (ref 0.0–7.0)
HCT: 42.1 % (ref 34.8–46.6)
HEMOGLOBIN: 12.7 g/dL (ref 11.6–15.9)
LYMPH#: 1 10*3/uL (ref 0.9–3.3)
LYMPH%: 8.1 % — AB (ref 14.0–48.0)
MCH: 27.5 pg (ref 26.0–34.0)
MCHC: 30.2 g/dL — AB (ref 32.0–36.0)
MCV: 91 fL (ref 81–101)
MONO#: 0.5 10*3/uL (ref 0.1–0.9)
MONO%: 4.4 % (ref 0.0–13.0)
NEUT#: 10.5 10*3/uL — ABNORMAL HIGH (ref 1.5–6.5)
NEUT%: 86 % — ABNORMAL HIGH (ref 39.6–80.0)
RBC: 4.61 10*6/uL (ref 3.70–5.32)
RDW: 17.4 % — AB (ref 11.1–15.7)
WBC: 12.2 10*3/uL — ABNORMAL HIGH (ref 3.9–10.0)

## 2016-10-15 LAB — TECHNOLOGIST REVIEW CHCC SATELLITE

## 2016-10-15 MED ORDER — ROMIPLOSTIM INJECTION 500 MCG
3.0000 ug/kg | Freq: Once | SUBCUTANEOUS | Status: AC
  Administered 2016-10-15: 265 ug via SUBCUTANEOUS
  Filled 2016-10-15: qty 0.53

## 2016-10-15 NOTE — Progress Notes (Signed)
Hematology and Oncology Follow Up Visit  [name] [name] [id number] April 06, 1931 83 y.o. 10/15/2016   Principle Diagnosis:  ITP - relapsed  Current Therapy:   Nplate - dosage adjusted as indicated    Interim History:  [name] [name] is here alone today for follow-up. She come in today without her portable O2 and sat on room air was 75%. We placed her on 3L [place] and her sat is now 95%. She is feeling much better.  She has received 2 doses of Nplate so far. Her platelet count today is 19. We will increase her dosage of Nplate. She has bruised easily but denies any episodes of bleeding or petechiae. Hgb is stable at 12.7 with an MCV of 91.  No fever, chills, n/v, cough, rash, dizziness, chest pain, palpitations, abdominal pain or changes in bowel or bladder habits. SOB is unchanged from baseline.  She has PVD with severe venous insufficiency and swelling in both legs. Both lower extremities are still wrapped with ace wraps and she continues to follow up with wound care once a week on Fridays. No numbness or tingling in her extremities. She states that she is eating well and staying hydrated. She states that she is unable to stand for weight.   Medications:  Allergies as of 10/15/2016      Reactions   Keflex [cephalexin] Other (See Comments)   intolerance      Medication List       Accurate as of 10/15/16 11:42 AM. Always use your most recent med list.          ACIDOPHILUS/CITRUS PECTIN Tabs Take 1 tablet by mouth daily.   albuterol (2.5 MG/3ML) 0.083% nebulizer solution Commonly known as:  PROVENTIL Take 2.5 mg by nebulization every 6 (six) hours as needed for wheezing or shortness of breath.   CALCARB 600/D PO Take 2 tablets by mouth daily.   clopidogrel 75 MG tablet Commonly known as:  PLAVIX Take 1 tablet (75 mg total) by mouth daily with breakfast.   Digoxin 62.5 MCG Tabs Take 0.0625 mg by mouth daily.   diltiazem 240 MG 24 hr capsule Commonly known as:  CARDIZEM CD Take  1 capsule (240 mg total) by mouth daily.   famciclovir 250 MG tablet Commonly known as:  FAMVIR Take 1 tablet (250 mg total) by mouth daily.   gabapentin 600 MG tablet Commonly known as:  NEURONTIN Take 1 tablet (600 mg total) by mouth 2 (two) times daily.   metoprolol 100 MG tablet Commonly known as:  LOPRESSOR Take 1 tablet (100 mg total) by mouth 2 (two) times daily.   Morphine Sulfate ER 15 MG T12a Take by mouth 2 (two) times daily.   oxyCODONE 5 MG immediate release tablet Commonly known as:  Oxy IR/ROXICODONE Take 1 tablet (5 mg total) by mouth every 6 (six) hours as needed for breakthrough pain (pain).   pantoprazole 40 MG tablet Commonly known as:  PROTONIX Take 1 tablet (40 mg total) by mouth daily.   potassium chloride SA 20 MEQ tablet Commonly known as:  K-DUR,KLOR-CON Take 1 tablet (20 mEq total) by mouth 2 (two) times daily.   predniSONE 10 MG tablet Commonly known as:  DELTASONE Take 4 tabs for 3 days, then 3 tabs for 3 days, then 2 tabs for 3 days, then 1 tab for 3 days, then 1/2 tab for 4 days.   sodium chloride 0.65 % Soln nasal spray Commonly known as:  OCEAN Place 1 spray into both nostrils 3 (  three) times daily.   SYSTANE OP Place 1 drop into both eyes 2 (two) times daily.   torsemide 20 MG tablet Commonly known as:  DEMADEX Take 80 mg by mouth daily.       Allergies:  Allergies  Allergen Reactions  . Keflex [Cephalexin] Other (See Comments)    intolerance    Past Medical History, Surgical history, Social history, and Family History were reviewed and updated.  Review of Systems: All other 10 point review of systems is negative.   Physical Exam:  vitals were not taken for this visit.  Wt Readings from Last 3 Encounters:  10/01/16 195 lb (88.5 kg)  09/20/16 215 lb 2.7 oz (97.6 kg)  09/05/16 199 lb (90.3 kg)    Ocular: Sclerae unicteric, pupils equal, round and reactive to light Ear-nose-throat: Oropharynx clear, dentition  fair Lymphatic: No cervical supraclavicular or axillary adenopathy Lungs no rales or rhonchi, good excursion bilaterally Heart regular rate and rhythm, no murmur appreciated Abd soft, nontender, positive bowel sounds, no liver or spleen tip palpated on exam, no fluid wave  MSK no focal spinal tenderness, no joint edema Neuro: non-focal, well-oriented, appropriate affect Breasts: Deferred  Lab Results  Component Value Date   WBC 10.6 (H) 10/08/2016   HGB 13.0 10/08/2016   HCT 42.6 10/08/2016   MCV 92 10/08/2016   PLT 41 Platelet count consistent in citrate (L) 10/08/2016   No results found for: FERRITIN, IRON, TIBC, UIBC, IRONPCTSAT Lab Results  Component Value Date   RBC 4.63 10/08/2016   No results found for: KPAFRELGTCHN, LAMBDASER, KAPLAMBRATIO No results found for: IGGSERUM, IGA, IGMSERUM No results found for: Ronnald Ramp, A1GS, A2GS, Tillman Sers, SPEI   Chemistry      Component Value Date/Time   NA 141 10/08/2016 1052   K 3.4 10/08/2016 1052   CL 98 10/08/2016 1052   CO2 30 10/08/2016 1052   BUN 32 (H) 10/08/2016 1052   CREATININE 1.1 10/08/2016 1052      Component Value Date/Time   CALCIUM 9.0 10/08/2016 1052   ALKPHOS 66 10/08/2016 1052   AST 16 10/08/2016 1052   ALT 16 10/08/2016 1052   BILITOT 1.10 10/08/2016 1052     Impression and Plan: [name] [name] is a very pleasant 83 yo female with history of ITP. She completed treatment with prednisone and has now relapsed. Her platelet count today is 19. She has had no episodse of bleeding and Hgb is stable at 12.7. We will increase her Nplate dose today and plan to see her back in 1 week again for repeat lab work and follow-up with injection.  Her living facility will contact us with any questions or concerns. We can certainly see her sooner if need be.   Eliezer Bottom, NP 1/29/201811:42 AM

## 2016-10-16 ENCOUNTER — Other Ambulatory Visit: Payer: Self-pay | Admitting: Family

## 2016-10-18 ENCOUNTER — Encounter (HOSPITAL_BASED_OUTPATIENT_CLINIC_OR_DEPARTMENT_OTHER): Payer: Medicare Other | Attending: Internal Medicine

## 2016-10-18 DIAGNOSIS — L97822 Non-pressure chronic ulcer of other part of left lower leg with fat layer exposed: Secondary | ICD-10-CM | POA: Insufficient documentation

## 2016-10-18 DIAGNOSIS — L97221 Non-pressure chronic ulcer of left calf limited to breakdown of skin: Secondary | ICD-10-CM | POA: Insufficient documentation

## 2016-10-18 DIAGNOSIS — L97812 Non-pressure chronic ulcer of other part of right lower leg with fat layer exposed: Secondary | ICD-10-CM | POA: Diagnosis not present

## 2016-10-18 DIAGNOSIS — I87323 Chronic venous hypertension (idiopathic) with inflammation of bilateral lower extremity: Secondary | ICD-10-CM | POA: Diagnosis present

## 2016-10-22 ENCOUNTER — Other Ambulatory Visit: Payer: Medicare Other

## 2016-10-22 ENCOUNTER — Ambulatory Visit: Payer: Medicare Other | Admitting: Family

## 2016-10-22 ENCOUNTER — Ambulatory Visit: Payer: Medicare Other

## 2016-10-26 DIAGNOSIS — I87323 Chronic venous hypertension (idiopathic) with inflammation of bilateral lower extremity: Secondary | ICD-10-CM | POA: Diagnosis not present

## 2016-11-02 ENCOUNTER — Other Ambulatory Visit (HOSPITAL_BASED_OUTPATIENT_CLINIC_OR_DEPARTMENT_OTHER): Payer: Medicare Other

## 2016-11-02 ENCOUNTER — Ambulatory Visit (HOSPITAL_BASED_OUTPATIENT_CLINIC_OR_DEPARTMENT_OTHER): Payer: Medicare Other

## 2016-11-02 ENCOUNTER — Ambulatory Visit (HOSPITAL_BASED_OUTPATIENT_CLINIC_OR_DEPARTMENT_OTHER): Payer: Medicare Other | Admitting: Family

## 2016-11-02 ENCOUNTER — Encounter: Payer: Self-pay | Admitting: Family

## 2016-11-02 VITALS — BP 113/62 | HR 73 | Temp 98.6°F | Resp 16 | Ht 62.0 in | Wt 197.8 lb

## 2016-11-02 DIAGNOSIS — D696 Thrombocytopenia, unspecified: Secondary | ICD-10-CM

## 2016-11-02 DIAGNOSIS — D693 Immune thrombocytopenic purpura: Secondary | ICD-10-CM

## 2016-11-02 LAB — CBC WITH DIFFERENTIAL (CANCER CENTER ONLY)
BASO#: 0.1 10*3/uL (ref 0.0–0.2)
BASO%: 0.6 % (ref 0.0–2.0)
EOS%: 1.6 % (ref 0.0–7.0)
Eosinophils Absolute: 0.2 10*3/uL (ref 0.0–0.5)
HCT: 42 % (ref 34.8–46.6)
HGB: 12.4 g/dL (ref 11.6–15.9)
LYMPH#: 1.6 10*3/uL (ref 0.9–3.3)
LYMPH%: 16.1 % (ref 14.0–48.0)
MCH: 25.9 pg — ABNORMAL LOW (ref 26.0–34.0)
MCHC: 29.5 g/dL — ABNORMAL LOW (ref 32.0–36.0)
MCV: 88 fL (ref 81–101)
MONO#: 0.5 10*3/uL (ref 0.1–0.9)
MONO%: 4.9 % (ref 0.0–13.0)
NEUT#: 7.6 10*3/uL — ABNORMAL HIGH (ref 1.5–6.5)
NEUT%: 76.8 % (ref 39.6–80.0)
RBC: 4.78 10*6/uL (ref 3.70–5.32)
RDW: 16.8 % — AB (ref 11.1–15.7)
WBC: 9.8 10*3/uL (ref 3.9–10.0)

## 2016-11-02 LAB — COMPREHENSIVE METABOLIC PANEL (CC13)
ALT: 6 IU/L (ref 0–32)
AST: 11 IU/L (ref 0–40)
Albumin, Serum: 3.4 g/dL — ABNORMAL LOW (ref 3.5–4.7)
Albumin/Globulin Ratio: 1 — ABNORMAL LOW (ref 1.2–2.2)
Alkaline Phosphatase, S: 67 IU/L (ref 39–117)
BUN/Creatinine Ratio: 35 — ABNORMAL HIGH (ref 12–28)
BUN: 40 mg/dL — AB (ref 8–27)
Bilirubin Total: 0.6 mg/dL (ref 0.0–1.2)
CALCIUM: 9.4 mg/dL (ref 8.7–10.3)
CHLORIDE: 99 mmol/L (ref 96–106)
CO2: 31 mmol/L — AB (ref 18–29)
Creatinine, Ser: 1.13 mg/dL — ABNORMAL HIGH (ref 0.57–1.00)
GFR, EST AFRICAN AMERICAN: 52 mL/min/{1.73_m2} — AB (ref >59)
GFR, EST NON AFRICAN AMERICAN: 45 mL/min/{1.73_m2} — AB (ref >59)
GLUCOSE: 157 mg/dL — AB (ref 65–99)
Globulin, Total: 3.4 g/dL (ref 1.5–4.5)
Potassium, Ser: 4.2 mmol/L (ref 3.5–5.2)
Sodium: 137 mmol/L (ref 134–144)
TOTAL PROTEIN: 6.8 g/dL (ref 6.0–8.5)

## 2016-11-02 LAB — TECHNOLOGIST REVIEW CHCC SATELLITE

## 2016-11-02 MED ORDER — ROMIPLOSTIM INJECTION 500 MCG
4.0000 ug/kg | Freq: Once | SUBCUTANEOUS | Status: AC
  Administered 2016-11-02: 360 ug via SUBCUTANEOUS
  Filled 2016-11-02: qty 0.72

## 2016-11-02 NOTE — Patient Instructions (Signed)
Romiplostim injection What is this medicine? ROMIPLOSTIM (roe mi PLOE stim) helps your body make more platelets. This medicine is used to treat low platelets caused by chronic idiopathic thrombocytopenic purpura (ITP). COMMON BRAND NAME(S): Nplate What should I tell my health care provider before I take this medicine? They need to know if you have any of these conditions: -cancer or myelodysplastic syndrome -low blood counts, like low white cell, platelet, or red cell counts -take medicines that treat or prevent blood clots -an unusual or allergic reaction to romiplostim, mannitol, other medicines, foods, dyes, or preservatives -pregnant or trying to get pregnant -breast-feeding How should I use this medicine? This medicine is for injection under the skin. It is given by a health care professional in a hospital or clinic setting. A special MedGuide will be given to you before your injection. Read this information carefully each time. Talk to your pediatrician regarding the use of this medicine in children. Special care may be needed. What if I miss a dose? It is important not to miss your dose. Call your doctor or health care professional if you are unable to keep an appointment. What may interact with this medicine? Interactions are not expected. What should I watch for while using this medicine? Your condition will be monitored carefully while you are receiving this medicine. Visit your prescriber or health care professional for regular checks on your progress and for the needed blood tests. It is important to keep all appointments. What side effects may I notice from receiving this medicine? Side effects that you should report to your doctor or health care professional as soon as possible: -allergic reactions like skin rash, itching or hives, swelling of the face, lips, or tongue -shortness of breath, chest pain, swelling in a leg -unusual bleeding or bruising Side effects that usually  do not require medical attention (report to your doctor or health care professional if they continue or are bothersome): -dizziness -headache -muscle aches -pain in arms and legs -stomach pain -trouble sleeping Where should I keep my medicine? This drug is given in a hospital or clinic and will not be stored at home.  2017 Elsevier/Gold Standard (2008-05-03 15:13:04)  

## 2016-11-02 NOTE — Progress Notes (Signed)
Hematology and Oncology Follow Up Visit  [name] [name] [id number] 09-16-1931 83 y.o. 11/02/2016   Principle Diagnosis:  ITP - relapsed  Current Therapy:   Nplate - dosage adjusted as indicated    Interim History:  [name] [name] is here alone today for follow-up. She is doing well and has no complaints at this time. She is slowly improving with each dose of Nplate. Her platelet count is now 30. No anemia.  She denies having any episode of bleeding or bruising. No lymphadenopathy found on exam.  No fever, chills, n/v, cough, rash, dizziness, chest pain, palpitations, abdominal pain or changes in bowel or bladder habits. SOB is unchanged from baseline.  She has PVD with severe venous insufficiency and swelling in both legs. Both lower extremities are still wrapped with ace wraps and she continues to follow up with wound care once a week. No numbness or tingling in her extremities. She states that she had a good appetite and is staying well hydrated.   Medications:  Allergies as of 11/02/2016      Reactions   Keflex [cephalexin] Other (See Comments)   intolerance      Medication List       Accurate as of 11/02/16  2:17 PM. Always use your most recent med list.          ACIDOPHILUS/CITRUS PECTIN Tabs Take 1 tablet by mouth daily.   albuterol (2.5 MG/3ML) 0.083% nebulizer solution Commonly known as:  PROVENTIL Take 2.5 mg by nebulization every 6 (six) hours as needed for wheezing or shortness of breath.   CALCARB 600/D PO Take 2 tablets by mouth daily.   clopidogrel 75 MG tablet Commonly known as:  PLAVIX Take 1 tablet (75 mg total) by mouth daily with breakfast.   Digoxin 62.5 MCG Tabs Take 0.0625 mg by mouth daily.   diltiazem 240 MG 24 hr capsule Commonly known as:  CARDIZEM CD Take 1 capsule (240 mg total) by mouth daily.   famciclovir 250 MG tablet Commonly known as:  FAMVIR Take 1 tablet (250 mg total) by mouth daily.   gabapentin 600 MG tablet Commonly  known as:  NEURONTIN Take 1 tablet (600 mg total) by mouth 2 (two) times daily.   metoprolol 100 MG tablet Commonly known as:  LOPRESSOR Take 1 tablet (100 mg total) by mouth 2 (two) times daily.   Morphine Sulfate ER 15 MG T12a Take by mouth 2 (two) times daily.   oxyCODONE 5 MG immediate release tablet Commonly known as:  Oxy IR/ROXICODONE Take 1 tablet (5 mg total) by mouth every 6 (six) hours as needed for breakthrough pain (pain).   pantoprazole 40 MG tablet Commonly known as:  PROTONIX Take 1 tablet (40 mg total) by mouth daily.   potassium chloride SA 20 MEQ tablet Commonly known as:  K-DUR,KLOR-CON Take 1 tablet (20 mEq total) by mouth 2 (two) times daily.   predniSONE 10 MG tablet Commonly known as:  DELTASONE Take 4 tabs for 3 days, then 3 tabs for 3 days, then 2 tabs for 3 days, then 1 tab for 3 days, then 1/2 tab for 4 days.   sodium chloride 0.65 % Soln nasal spray Commonly known as:  OCEAN Place 1 spray into both nostrils 3 (three) times daily.   SYSTANE OP Place 1 drop into both eyes 2 (two) times daily.   torsemide 20 MG tablet Commonly known as:  DEMADEX Take 80 mg by mouth daily.       Allergies:  Allergies  Allergen Reactions  . Keflex [Cephalexin] Other (See Comments)    intolerance    Past Medical History, Surgical history, Social history, and Family History were reviewed and updated.  Review of Systems: All other 10 point review of systems is negative.   Physical Exam:  vitals were not taken for this visit.  Wt Readings from Last 3 Encounters:  10/01/16 195 lb (88.5 kg)  09/20/16 215 lb 2.7 oz (97.6 kg)  09/05/16 199 lb (90.3 kg)    Ocular: Sclerae unicteric, pupils equal, round and reactive to light Ear-nose-throat: Oropharynx clear, dentition fair Lymphatic: No cervical supraclavicular or axillary adenopathy Lungs no rales or rhonchi, good excursion bilaterally Heart regular rate and rhythm, no murmur appreciated Abd soft,  nontender, positive bowel sounds, no liver or spleen tip palpated on exam, no fluid wave  MSK no focal spinal tenderness, no joint edema Neuro: non-focal, well-oriented, appropriate affect Breasts: Deferred  Lab Results  Component Value Date   WBC 12.2 (H) 10/15/2016   HGB 12.7 10/15/2016   HCT 42.1 10/15/2016   MCV 91 10/15/2016   PLT 19 Platelet count consistent in citrate (L) 10/15/2016   No results found for: FERRITIN, IRON, TIBC, UIBC, IRONPCTSAT Lab Results  Component Value Date   RBC 4.61 10/15/2016   No results found for: KPAFRELGTCHN, LAMBDASER, KAPLAMBRATIO No results found for: IGGSERUM, IGA, IGMSERUM No results found for: Ronnald Ramp, A1GS, A2GS, Tillman Sers, SPEI   Chemistry      Component Value Date/Time   NA 141 10/15/2016 1121   K 4.0 10/15/2016 1121   CL 98 10/15/2016 1121   CO2 30 10/15/2016 1121   BUN 34 (H) 10/15/2016 1121   CREATININE 1.1 10/15/2016 1121      Component Value Date/Time   CALCIUM 8.8 10/15/2016 1121   ALKPHOS 59 10/15/2016 1121   AST 18 10/15/2016 1121   ALT 12 10/15/2016 1121   BILITOT 1.30 10/15/2016 1121     Impression and Plan: [name] [name] is a very pleasant 83 yo female with history of ITP. She completed treatment with prednisone and has now relapsed. She is responding slowly to Nplate. Platelet count is now 30. Thankfully, she has had no episodes of bleeding.  We will give her another dose of Nplate today and plan to see her back in 1 weeks for repeat lab work and follow-up.  Her living facility will contact us with any questions or concerns. We can certainly see her sooner if need be.   Eliezer Bottom, NP 2/16/20182:17 PM

## 2016-11-08 ENCOUNTER — Other Ambulatory Visit (HOSPITAL_BASED_OUTPATIENT_CLINIC_OR_DEPARTMENT_OTHER): Payer: Medicare Other

## 2016-11-08 ENCOUNTER — Ambulatory Visit (HOSPITAL_BASED_OUTPATIENT_CLINIC_OR_DEPARTMENT_OTHER): Payer: Medicare Other | Admitting: Family

## 2016-11-08 ENCOUNTER — Ambulatory Visit (HOSPITAL_BASED_OUTPATIENT_CLINIC_OR_DEPARTMENT_OTHER): Payer: Medicare Other

## 2016-11-08 VITALS — BP 136/80 | HR 131 | Temp 98.6°F

## 2016-11-08 DIAGNOSIS — D693 Immune thrombocytopenic purpura: Secondary | ICD-10-CM

## 2016-11-08 DIAGNOSIS — I872 Venous insufficiency (chronic) (peripheral): Secondary | ICD-10-CM

## 2016-11-08 DIAGNOSIS — D696 Thrombocytopenia, unspecified: Secondary | ICD-10-CM

## 2016-11-08 DIAGNOSIS — I739 Peripheral vascular disease, unspecified: Secondary | ICD-10-CM

## 2016-11-08 LAB — CBC WITH DIFFERENTIAL (CANCER CENTER ONLY)
BASO#: 0 10*3/uL (ref 0.0–0.2)
BASO%: 0.3 % (ref 0.0–2.0)
EOS%: 1.7 % (ref 0.0–7.0)
Eosinophils Absolute: 0.2 10*3/uL (ref 0.0–0.5)
HCT: 42.1 % (ref 34.8–46.6)
HEMOGLOBIN: 12.3 g/dL (ref 11.6–15.9)
LYMPH#: 1.1 10*3/uL (ref 0.9–3.3)
LYMPH%: 11.8 % — ABNORMAL LOW (ref 14.0–48.0)
MCH: 25.2 pg — AB (ref 26.0–34.0)
MCHC: 29.2 g/dL — ABNORMAL LOW (ref 32.0–36.0)
MCV: 86 fL (ref 81–101)
MONO#: 0.4 10*3/uL (ref 0.1–0.9)
MONO%: 3.9 % (ref 0.0–13.0)
NEUT%: 82.3 % — ABNORMAL HIGH (ref 39.6–80.0)
NEUTROS ABS: 7.9 10*3/uL — AB (ref 1.5–6.5)
Platelets: 21 10*3/uL — ABNORMAL LOW (ref 145–400)
RBC: 4.88 10*6/uL (ref 3.70–5.32)
RDW: 17.6 % — ABNORMAL HIGH (ref 11.1–15.7)
WBC: 9.6 10*3/uL (ref 3.9–10.0)

## 2016-11-08 LAB — COMPREHENSIVE METABOLIC PANEL
ALT: 6 U/L (ref 0–55)
AST: 11 U/L (ref 5–34)
Albumin: 3.2 g/dL — ABNORMAL LOW (ref 3.5–5.0)
Alkaline Phosphatase: 79 U/L (ref 40–150)
Anion Gap: 11 mEq/L (ref 3–11)
BUN: 29.4 mg/dL — ABNORMAL HIGH (ref 7.0–26.0)
CO2: 31 mEq/L — ABNORMAL HIGH (ref 22–29)
Calcium: 9.5 mg/dL (ref 8.4–10.4)
Chloride: 99 mEq/L (ref 98–109)
Creatinine: 1.2 mg/dL — ABNORMAL HIGH (ref 0.6–1.1)
EGFR: 42 mL/min/{1.73_m2} — ABNORMAL LOW (ref >90)
Glucose: 179 mg/dl — ABNORMAL HIGH (ref 70–140)
Potassium: 4.2 mEq/L (ref 3.5–5.1)
Sodium: 141 mEq/L (ref 136–145)
Total Bilirubin: 0.98 mg/dL (ref 0.20–1.20)
Total Protein: 7.2 g/dL (ref 6.4–8.3)

## 2016-11-08 MED ORDER — ROMIPLOSTIM INJECTION 500 MCG
5.0000 ug/kg | Freq: Once | SUBCUTANEOUS | Status: AC
  Administered 2016-11-08: 450 ug via SUBCUTANEOUS
  Filled 2016-11-08: qty 0.9

## 2016-11-08 NOTE — Progress Notes (Signed)
Hematology and Oncology Follow Up Visit  [name] [name] [id number] 10-10-1930 83 y.o. 11/08/2016   Principle Diagnosis:  ITP - relapsed  Current Therapy:   Nplate - dosage adjusted as indicated    Interim History:  [name] [name] is here alone today for follow-up. She is doing fairly well. She is getting over a bout with bronchitis and states that she just completed a course of Levaquin. She has a dry cough that is improved. She is tachycardic today with a HR of 131, all other vss and she is asymptomatic with this. I did make note of this on her paperwork for the facility where she lives so they will continue to monitor.   She denies having any episode of bleeding or excessive bruising. No lymphadenopathy found on exam.  No fever, chills, n/v, rash, dizziness, chest pain, palpitations, abdominal pain or changes in bowel or bladder habits.  SOB is unchanged from baseline. She is on 2L [place] supplemental O2 as needed.  She has PVD with severe venous insufficiency and swelling in both legs. Both lower extremities are still wrapped with ace wraps and she continues to follow up with wound care once a week on Fridays. No numbness or tingling in her extremities. She states that she has maintained a good appetite and is staying well hydrated. She states that she is unable to stand to be weighed.   Medications:  Allergies as of 11/08/2016      Reactions   Keflex [cephalexin] Other (See Comments)   intolerance      Medication List       Accurate as of 11/08/16 10:16 AM. Always use your most recent med list.          ACIDOPHILUS/CITRUS PECTIN Tabs Take 1 tablet by mouth daily.   albuterol (2.5 MG/3ML) 0.083% nebulizer solution Commonly known as:  PROVENTIL Take 2.5 mg by nebulization every 6 (six) hours as needed for wheezing or shortness of breath.   CALCARB 600/D PO Take 2 tablets by mouth daily.   clopidogrel 75 MG tablet Commonly known as:  PLAVIX Take 1 tablet (75 mg total) by  mouth daily with breakfast.   Digoxin 62.5 MCG Tabs Take 0.0625 mg by mouth daily.   diltiazem 240 MG 24 hr capsule Commonly known as:  CARDIZEM CD Take 1 capsule (240 mg total) by mouth daily.   famciclovir 250 MG tablet Commonly known as:  FAMVIR Take 1 tablet (250 mg total) by mouth daily.   gabapentin 600 MG tablet Commonly known as:  NEURONTIN Take 1 tablet (600 mg total) by mouth 2 (two) times daily.   metoprolol 100 MG tablet Commonly known as:  LOPRESSOR Take 1 tablet (100 mg total) by mouth 2 (two) times daily.   Morphine Sulfate ER 15 MG T12a Take by mouth 2 (two) times daily.   oxyCODONE 5 MG immediate release tablet Commonly known as:  Oxy IR/ROXICODONE Take 1 tablet (5 mg total) by mouth every 6 (six) hours as needed for breakthrough pain (pain).   pantoprazole 40 MG tablet Commonly known as:  PROTONIX Take 1 tablet (40 mg total) by mouth daily.   potassium chloride SA 20 MEQ tablet Commonly known as:  K-DUR,KLOR-CON Take 1 tablet (20 mEq total) by mouth 2 (two) times daily.   predniSONE 10 MG tablet Commonly known as:  DELTASONE Take 4 tabs for 3 days, then 3 tabs for 3 days, then 2 tabs for 3 days, then 1 tab for 3 days, then 1/2 tab  for 4 days.   sodium chloride 0.65 % Soln nasal spray Commonly known as:  OCEAN Place 1 spray into both nostrils 3 (three) times daily.   SYSTANE OP Place 1 drop into both eyes 2 (two) times daily.   torsemide 20 MG tablet Commonly known as:  DEMADEX Take 80 mg by mouth daily.       Allergies:  Allergies  Allergen Reactions  . Keflex [Cephalexin] Other (See Comments)    intolerance    Past Medical History, Surgical history, Social history, and Family History were reviewed and updated.  Review of Systems: All other 10 point review of systems is negative.   Physical Exam:  vitals were not taken for this visit.  Wt Readings from Last 3 Encounters:  11/02/16 197 lb 12.8 oz (89.7 kg)  10/01/16 195 lb (88.5  kg)  09/20/16 215 lb 2.7 oz (97.6 kg)    Ocular: Sclerae unicteric, pupils equal, round and reactive to light Ear-nose-throat: Oropharynx clear, dentition fair Lymphatic: No cervical supraclavicular or axillary adenopathy Lungs no rales or rhonchi, good excursion bilaterally Heart regular rate and rhythm, no murmur appreciated Abd soft, nontender, positive bowel sounds, no liver or spleen tip palpated on exam, no fluid wave  MSK no focal spinal tenderness, no joint edema Neuro: non-focal, well-oriented, appropriate affect Breasts: Deferred  Lab Results  Component Value Date   WBC 9.8 11/02/2016   HGB 12.4 11/02/2016   HCT 42.0 11/02/2016   MCV 88 11/02/2016   PLT 30 Large & Occ giant platelets (L) 11/02/2016   No results found for: FERRITIN, IRON, TIBC, UIBC, IRONPCTSAT Lab Results  Component Value Date   RBC 4.78 11/02/2016   No results found for: KPAFRELGTCHN, LAMBDASER, KAPLAMBRATIO No results found for: IGGSERUM, IGA, IGMSERUM No results found for: Odetta Pink, SPEI   Chemistry      Component Value Date/Time   NA 137 11/02/2016 1354   NA 141 10/15/2016 1121   K 4.2 11/02/2016 1354   K 4.0 10/15/2016 1121   CL 99 11/02/2016 1354   CL 98 10/15/2016 1121   CO2 31 (H) 11/02/2016 1354   CO2 30 10/15/2016 1121   BUN 40 (H) 11/02/2016 1354   BUN 34 (H) 10/15/2016 1121   CREATININE 1.13 (H) 11/02/2016 1354   CREATININE 1.1 10/15/2016 1121      Component Value Date/Time   CALCIUM 9.4 11/02/2016 1354   CALCIUM 8.8 10/15/2016 1121   ALKPHOS 67 11/02/2016 1354   ALKPHOS 59 10/15/2016 1121   AST 11 11/02/2016 1354   AST 18 10/15/2016 1121   ALT 6 11/02/2016 1354   ALT 12 10/15/2016 1121   BILITOT 0.6 11/02/2016 1354   BILITOT 1.30 10/15/2016 1121     Impression and Plan: [name] [name] is a very pleasant 83 yo female with history of ITP. She completed treatment with prednisone and has now relapsed. Her platelet  count today is 21, Hgb stable at 12.3. She has been on Levaquin for bronchitis which may have influenced her counts.  I spoke with Dr. Marin Olp and for now we will stay th course with Nplate.  We will give her another dose of Nplate today and plan to see her back weekly for lab work and injection if needed and will schedule follow-up for in 3 weeks. Her living facility will contact us with any questions or concerns. We can certainly see her sooner if need be.   Eliezer Bottom, NP 2/22/201810:16 AM

## 2016-11-08 NOTE — Patient Instructions (Signed)
Romiplostim injection What is this medicine? ROMIPLOSTIM (roe mi PLOE stim) helps your body make more platelets. This medicine is used to treat low platelets caused by chronic idiopathic thrombocytopenic purpura (ITP). COMMON BRAND NAME(S): Nplate What should I tell my health care provider before I take this medicine? They need to know if you have any of these conditions: -cancer or myelodysplastic syndrome -low blood counts, like low white cell, platelet, or red cell counts -take medicines that treat or prevent blood clots -an unusual or allergic reaction to romiplostim, mannitol, other medicines, foods, dyes, or preservatives -pregnant or trying to get pregnant -breast-feeding How should I use this medicine? This medicine is for injection under the skin. It is given by a health care professional in a hospital or clinic setting. A special MedGuide will be given to you before your injection. Read this information carefully each time. Talk to your pediatrician regarding the use of this medicine in children. Special care may be needed. What if I miss a dose? It is important not to miss your dose. Call your doctor or health care professional if you are unable to keep an appointment. What may interact with this medicine? Interactions are not expected. What should I watch for while using this medicine? Your condition will be monitored carefully while you are receiving this medicine. Visit your prescriber or health care professional for regular checks on your progress and for the needed blood tests. It is important to keep all appointments. What side effects may I notice from receiving this medicine? Side effects that you should report to your doctor or health care professional as soon as possible: -allergic reactions like skin rash, itching or hives, swelling of the face, lips, or tongue -shortness of breath, chest pain, swelling in a leg -unusual bleeding or bruising Side effects that usually  do not require medical attention (report to your doctor or health care professional if they continue or are bothersome): -dizziness -headache -muscle aches -pain in arms and legs -stomach pain -trouble sleeping Where should I keep my medicine? This drug is given in a hospital or clinic and will not be stored at home.  2017 Elsevier/Gold Standard (2008-05-03 15:13:04)  

## 2016-11-09 DIAGNOSIS — I87323 Chronic venous hypertension (idiopathic) with inflammation of bilateral lower extremity: Secondary | ICD-10-CM | POA: Diagnosis not present

## 2016-11-14 ENCOUNTER — Other Ambulatory Visit (HOSPITAL_BASED_OUTPATIENT_CLINIC_OR_DEPARTMENT_OTHER): Payer: Medicare Other

## 2016-11-14 ENCOUNTER — Ambulatory Visit (HOSPITAL_BASED_OUTPATIENT_CLINIC_OR_DEPARTMENT_OTHER): Payer: Medicare Other

## 2016-11-14 VITALS — BP 110/71 | HR 96 | Temp 98.3°F | Resp 18

## 2016-11-14 DIAGNOSIS — D693 Immune thrombocytopenic purpura: Secondary | ICD-10-CM

## 2016-11-14 DIAGNOSIS — D696 Thrombocytopenia, unspecified: Secondary | ICD-10-CM

## 2016-11-14 LAB — CBC WITH DIFFERENTIAL (CANCER CENTER ONLY)
BASO#: 0 10*3/uL (ref 0.0–0.2)
BASO%: 0.4 % (ref 0.0–2.0)
EOS ABS: 0.2 10*3/uL (ref 0.0–0.5)
EOS%: 1.6 % (ref 0.0–7.0)
HCT: 40.7 % (ref 34.8–46.6)
HGB: 11.8 g/dL (ref 11.6–15.9)
LYMPH#: 1.2 10*3/uL (ref 0.9–3.3)
LYMPH%: 11.1 % — AB (ref 14.0–48.0)
MCH: 25.3 pg — ABNORMAL LOW (ref 26.0–34.0)
MCHC: 29 g/dL — ABNORMAL LOW (ref 32.0–36.0)
MCV: 87 fL (ref 81–101)
MONO#: 0.4 10*3/uL (ref 0.1–0.9)
MONO%: 3.4 % (ref 0.0–13.0)
NEUT#: 8.9 10*3/uL — ABNORMAL HIGH (ref 1.5–6.5)
NEUT%: 83.5 % — ABNORMAL HIGH (ref 39.6–80.0)
RBC: 4.67 10*6/uL (ref 3.70–5.32)
RDW: 19 % — ABNORMAL HIGH (ref 11.1–15.7)
WBC: 10.6 10*3/uL — AB (ref 3.9–10.0)

## 2016-11-14 LAB — COMPREHENSIVE METABOLIC PANEL
ALBUMIN: 3.2 g/dL — AB (ref 3.5–5.0)
ALK PHOS: 74 U/L (ref 40–150)
AST: 12 U/L (ref 5–34)
Anion Gap: 10 mEq/L (ref 3–11)
BILIRUBIN TOTAL: 1.23 mg/dL — AB (ref 0.20–1.20)
BUN: 38.9 mg/dL — AB (ref 7.0–26.0)
CO2: 32 meq/L — AB (ref 22–29)
Calcium: 9.3 mg/dL (ref 8.4–10.4)
Chloride: 98 mEq/L (ref 98–109)
Creatinine: 1.2 mg/dL — ABNORMAL HIGH (ref 0.6–1.1)
EGFR: 40 mL/min/{1.73_m2} — ABNORMAL LOW (ref >90)
GLUCOSE: 173 mg/dL — AB (ref 70–140)
Potassium: 4.3 mEq/L (ref 3.5–5.1)
SODIUM: 140 meq/L (ref 136–145)
TOTAL PROTEIN: 6.9 g/dL (ref 6.4–8.3)

## 2016-11-14 LAB — TECHNOLOGIST REVIEW CHCC SATELLITE: Tech Review: 5

## 2016-11-14 MED ORDER — ROMIPLOSTIM INJECTION 500 MCG
6.0000 ug/kg | Freq: Once | SUBCUTANEOUS | Status: AC
  Administered 2016-11-14: 540 ug via SUBCUTANEOUS
  Filled 2016-11-14: qty 1

## 2016-11-14 NOTE — Patient Instructions (Signed)
Romiplostim injection What is this medicine? ROMIPLOSTIM (roe mi PLOE stim) helps your body make more platelets. This medicine is used to treat low platelets caused by chronic idiopathic thrombocytopenic purpura (ITP). COMMON BRAND NAME(S): Nplate What should I tell my health care provider before I take this medicine? They need to know if you have any of these conditions: -cancer or myelodysplastic syndrome -low blood counts, like low white cell, platelet, or red cell counts -take medicines that treat or prevent blood clots -an unusual or allergic reaction to romiplostim, mannitol, other medicines, foods, dyes, or preservatives -pregnant or trying to get pregnant -breast-feeding How should I use this medicine? This medicine is for injection under the skin. It is given by a health care professional in a hospital or clinic setting. A special MedGuide will be given to you before your injection. Read this information carefully each time. Talk to your pediatrician regarding the use of this medicine in children. Special care may be needed. What if I miss a dose? It is important not to miss your dose. Call your doctor or health care professional if you are unable to keep an appointment. What may interact with this medicine? Interactions are not expected. What should I watch for while using this medicine? Your condition will be monitored carefully while you are receiving this medicine. Visit your prescriber or health care professional for regular checks on your progress and for the needed blood tests. It is important to keep all appointments. What side effects may I notice from receiving this medicine? Side effects that you should report to your doctor or health care professional as soon as possible: -allergic reactions like skin rash, itching or hives, swelling of the face, lips, or tongue -shortness of breath, chest pain, swelling in a leg -unusual bleeding or bruising Side effects that usually  do not require medical attention (report to your doctor or health care professional if they continue or are bothersome): -dizziness -headache -muscle aches -pain in arms and legs -stomach pain -trouble sleeping Where should I keep my medicine? This drug is given in a hospital or clinic and will not be stored at home.  2017 Elsevier/Gold Standard (2008-05-03 15:13:04)

## 2016-11-17 ENCOUNTER — Encounter (HOSPITAL_COMMUNITY): Payer: Self-pay | Admitting: Emergency Medicine

## 2016-11-17 ENCOUNTER — Inpatient Hospital Stay (HOSPITAL_COMMUNITY)
Admission: EM | Admit: 2016-11-17 | Discharge: 2016-11-23 | DRG: 987 | Disposition: A | Discharge status: Skilled Nursing Facility | Payer: Medicare Other | Attending: Internal Medicine | Admitting: Internal Medicine

## 2016-11-17 ENCOUNTER — Emergency Department (HOSPITAL_COMMUNITY): Payer: Medicare Other

## 2016-11-17 ENCOUNTER — Other Ambulatory Visit: Payer: Self-pay

## 2016-11-17 DIAGNOSIS — Z7902 Long term (current) use of antithrombotics/antiplatelets: Secondary | ICD-10-CM

## 2016-11-17 DIAGNOSIS — Z881 Allergy status to other antibiotic agents status: Secondary | ICD-10-CM

## 2016-11-17 DIAGNOSIS — Z8249 Family history of ischemic heart disease and other diseases of the circulatory system: Secondary | ICD-10-CM

## 2016-11-17 DIAGNOSIS — J9621 Acute and chronic respiratory failure with hypoxia: Secondary | ICD-10-CM | POA: Diagnosis present

## 2016-11-17 DIAGNOSIS — I2723 Pulmonary hypertension due to lung diseases and hypoxia: Secondary | ICD-10-CM | POA: Diagnosis present

## 2016-11-17 DIAGNOSIS — I509 Heart failure, unspecified: Secondary | ICD-10-CM

## 2016-11-17 DIAGNOSIS — D696 Thrombocytopenia, unspecified: Secondary | ICD-10-CM | POA: Diagnosis present

## 2016-11-17 DIAGNOSIS — R609 Edema, unspecified: Secondary | ICD-10-CM | POA: Diagnosis not present

## 2016-11-17 DIAGNOSIS — Z22322 Carrier or suspected carrier of Methicillin resistant Staphylococcus aureus: Secondary | ICD-10-CM

## 2016-11-17 DIAGNOSIS — I130 Hypertensive heart and chronic kidney disease with heart failure and stage 1 through stage 4 chronic kidney disease, or unspecified chronic kidney disease: Principal | ICD-10-CM | POA: Diagnosis present

## 2016-11-17 DIAGNOSIS — Z66 Do not resuscitate: Secondary | ICD-10-CM | POA: Diagnosis present

## 2016-11-17 DIAGNOSIS — D631 Anemia in chronic kidney disease: Secondary | ICD-10-CM | POA: Diagnosis present

## 2016-11-17 DIAGNOSIS — Z96641 Presence of right artificial hip joint: Secondary | ICD-10-CM | POA: Diagnosis present

## 2016-11-17 DIAGNOSIS — I10 Essential (primary) hypertension: Secondary | ICD-10-CM | POA: Diagnosis present

## 2016-11-17 DIAGNOSIS — N183 Chronic kidney disease, stage 3 unspecified: Secondary | ICD-10-CM | POA: Diagnosis present

## 2016-11-17 DIAGNOSIS — I83228 Varicose veins of left lower extremity with both ulcer of other part of lower extremity and inflammation: Secondary | ICD-10-CM | POA: Diagnosis present

## 2016-11-17 DIAGNOSIS — E669 Obesity, unspecified: Secondary | ICD-10-CM | POA: Diagnosis present

## 2016-11-17 DIAGNOSIS — I482 Chronic atrial fibrillation, unspecified: Secondary | ICD-10-CM | POA: Diagnosis present

## 2016-11-17 DIAGNOSIS — L97909 Non-pressure chronic ulcer of unspecified part of unspecified lower leg with unspecified severity: Secondary | ICD-10-CM

## 2016-11-17 DIAGNOSIS — I872 Venous insufficiency (chronic) (peripheral): Secondary | ICD-10-CM | POA: Diagnosis present

## 2016-11-17 DIAGNOSIS — J9601 Acute respiratory failure with hypoxia: Secondary | ICD-10-CM | POA: Diagnosis not present

## 2016-11-17 DIAGNOSIS — I2510 Atherosclerotic heart disease of native coronary artery without angina pectoris: Secondary | ICD-10-CM | POA: Diagnosis present

## 2016-11-17 DIAGNOSIS — M549 Dorsalgia, unspecified: Secondary | ICD-10-CM | POA: Diagnosis present

## 2016-11-17 DIAGNOSIS — I83009 Varicose veins of unspecified lower extremity with ulcer of unspecified site: Secondary | ICD-10-CM | POA: Diagnosis present

## 2016-11-17 DIAGNOSIS — Z969 Presence of functional implant, unspecified: Secondary | ICD-10-CM | POA: Diagnosis present

## 2016-11-17 DIAGNOSIS — Z79899 Other long term (current) drug therapy: Secondary | ICD-10-CM

## 2016-11-17 DIAGNOSIS — I5033 Acute on chronic diastolic (congestive) heart failure: Secondary | ICD-10-CM | POA: Diagnosis present

## 2016-11-17 DIAGNOSIS — I252 Old myocardial infarction: Secondary | ICD-10-CM

## 2016-11-17 DIAGNOSIS — G8929 Other chronic pain: Secondary | ICD-10-CM | POA: Diagnosis present

## 2016-11-17 DIAGNOSIS — Z9981 Dependence on supplemental oxygen: Secondary | ICD-10-CM

## 2016-11-17 DIAGNOSIS — E785 Hyperlipidemia, unspecified: Secondary | ICD-10-CM | POA: Diagnosis present

## 2016-11-17 DIAGNOSIS — I83218 Varicose veins of right lower extremity with both ulcer of other part of lower extremity and inflammation: Secondary | ICD-10-CM | POA: Diagnosis present

## 2016-11-17 DIAGNOSIS — Z7189 Other specified counseling: Secondary | ICD-10-CM

## 2016-11-17 DIAGNOSIS — R0902 Hypoxemia: Secondary | ICD-10-CM

## 2016-11-17 DIAGNOSIS — I25810 Atherosclerosis of coronary artery bypass graft(s) without angina pectoris: Secondary | ICD-10-CM | POA: Diagnosis present

## 2016-11-17 DIAGNOSIS — Z6834 Body mass index (BMI) 34.0-34.9, adult: Secondary | ICD-10-CM

## 2016-11-17 DIAGNOSIS — Z515 Encounter for palliative care: Secondary | ICD-10-CM | POA: Diagnosis not present

## 2016-11-17 DIAGNOSIS — D693 Immune thrombocytopenic purpura: Secondary | ICD-10-CM | POA: Diagnosis present

## 2016-11-17 DIAGNOSIS — M1990 Unspecified osteoarthritis, unspecified site: Secondary | ICD-10-CM | POA: Diagnosis present

## 2016-11-17 LAB — CBC
HCT: 40.3 % (ref 36.0–46.0)
HCT: 38.5 % (ref 36.0–46.0)
HEMOGLOBIN: 11.2 g/dL — AB (ref 12.0–15.0)
Hemoglobin: 10.6 g/dL — ABNORMAL LOW (ref 12.0–15.0)
MCH: 24.5 pg — AB (ref 26.0–34.0)
MCH: 24.4 pg — AB (ref 26.0–34.0)
MCHC: 27.8 g/dL — ABNORMAL LOW (ref 30.0–36.0)
MCHC: 27.5 g/dL — ABNORMAL LOW (ref 30.0–36.0)
MCV: 88.2 fL (ref 78.0–100.0)
MCV: 88.5 fL (ref 78.0–100.0)
PLATELETS: DECREASED 10*3/uL (ref 150–400)
PLATELETS: DECREASED 10*3/uL (ref 150–400)
RBC: 4.57 MIL/uL (ref 3.87–5.11)
RBC: 4.35 MIL/uL (ref 3.87–5.11)
RDW: 19.1 % — ABNORMAL HIGH (ref 11.5–15.5)
RDW: 18.8 % — ABNORMAL HIGH (ref 11.5–15.5)
WBC: 11.3 10*3/uL — AB (ref 4.0–10.5)
WBC: 10 10*3/uL (ref 4.0–10.5)

## 2016-11-17 LAB — TROPONIN I: Troponin I: <0.03 ng/mL (ref <0.03)

## 2016-11-17 LAB — CREATININE, SERUM
CREATININE: 1.47 mg/dL — AB (ref 0.44–1.00)
GFR calc Af Amer: 37 mL/min — ABNORMAL LOW (ref >60)
GFR calc non Af Amer: 32 mL/min — ABNORMAL LOW (ref >60)

## 2016-11-17 LAB — I-STAT ARTERIAL BLOOD GAS, ED
ACID-BASE EXCESS: 8 mmol/L — AB (ref 0.0–2.0)
BICARBONATE: 34.6 mmol/L — AB (ref 20.0–28.0)
O2 SAT: 85 %
PH ART: 7.407 (ref 7.350–7.450)
PO2 ART: 51 mmHg — AB (ref 83.0–108.0)
TCO2: 36 mmol/L (ref 0–100)
pCO2 arterial: 55 mmHg — ABNORMAL HIGH (ref 32.0–48.0)

## 2016-11-17 LAB — BASIC METABOLIC PANEL
ANION GAP: 9 (ref 5–15)
BUN: 42 mg/dL — ABNORMAL HIGH (ref 6–20)
CALCIUM: 8.8 mg/dL — AB (ref 8.9–10.3)
CO2: 30 mmol/L (ref 22–32)
CREATININE: 1.38 mg/dL — AB (ref 0.44–1.00)
Chloride: 97 mmol/L — ABNORMAL LOW (ref 101–111)
GFR, EST AFRICAN AMERICAN: 40 mL/min — AB (ref >60)
GFR, EST NON AFRICAN AMERICAN: 34 mL/min — AB (ref >60)
Glucose, Bld: 148 mg/dL — ABNORMAL HIGH (ref 65–99)
Potassium: 4.3 mmol/L (ref 3.5–5.1)
Sodium: 136 mmol/L (ref 135–145)

## 2016-11-17 LAB — I-STAT TROPONIN, ED: TROPONIN I, POC: 0 ng/mL (ref 0.00–0.08)

## 2016-11-17 LAB — BRAIN NATRIURETIC PEPTIDE: B Natriuretic Peptide: 1492.9 pg/mL — ABNORMAL HIGH (ref 0.0–100.0)

## 2016-11-17 MED ORDER — SALINE SPRAY 0.65 % NA SOLN
1.0000 | Freq: Three times a day (TID) | NASAL | Status: DC
  Administered 2016-11-17 – 2016-11-23 (×17): 1 via NASAL
  Filled 2016-11-17: qty 44

## 2016-11-17 MED ORDER — DIGOXIN 125 MCG PO TABS
0.0625 mg | ORAL_TABLET | Freq: Every day | ORAL | Status: DC
  Administered 2016-11-18 – 2016-11-23 (×6): 0.0625 mg via ORAL
  Filled 2016-11-17 (×7): qty 1

## 2016-11-17 MED ORDER — FUROSEMIDE 10 MG/ML IJ SOLN
40.0000 mg | Freq: Two times a day (BID) | INTRAMUSCULAR | Status: DC
  Administered 2016-11-18 – 2016-11-19 (×4): 40 mg via INTRAVENOUS
  Filled 2016-11-17 (×4): qty 4

## 2016-11-17 MED ORDER — FAMCICLOVIR 500 MG PO TABS: 250.0000 mg | ORAL_TABLET | Freq: Every day | ORAL | Status: DC

## 2016-11-17 MED ORDER — POTASSIUM CHLORIDE CRYS ER 20 MEQ PO TBCR
20.0000 meq | EXTENDED_RELEASE_TABLET | Freq: Two times a day (BID) | ORAL | Status: DC
  Administered 2016-11-17 – 2016-11-23 (×12): 20 meq via ORAL
  Filled 2016-11-17 (×12): qty 1

## 2016-11-17 MED ORDER — ACETAMINOPHEN 325 MG PO TABS: 650.0000 mg | ORAL_TABLET | ORAL | Status: DC | PRN

## 2016-11-17 MED ORDER — VALACYCLOVIR HCL 500 MG PO TABS
500.0000 mg | ORAL_TABLET | Freq: Every day | ORAL | Status: DC
  Administered 2016-11-18 – 2016-11-23 (×6): 500 mg via ORAL
  Filled 2016-11-17 (×6): qty 1

## 2016-11-17 MED ORDER — MORPHINE SULFATE ER 20 MG PO CP24: 20.0000 mg | ORAL_CAPSULE | Freq: Two times a day (BID) | ORAL | Status: DC

## 2016-11-17 MED ORDER — PRO-STAT SUGAR FREE PO LIQD
30.0000 mL | Freq: Two times a day (BID) | ORAL | Status: DC
  Administered 2016-11-17 – 2016-11-23 (×12): 30 mL via ORAL
  Filled 2016-11-17 (×12): qty 30

## 2016-11-17 MED ORDER — DILTIAZEM HCL ER COATED BEADS 240 MG PO CP24
240.0000 mg | ORAL_CAPSULE | Freq: Every day | ORAL | Status: DC
  Administered 2016-11-18 – 2016-11-23 (×6): 240 mg via ORAL
  Filled 2016-11-17 (×6): qty 1

## 2016-11-17 MED ORDER — SODIUM CHLORIDE 0.9% FLUSH: 3.0000 mL | INTRAVENOUS | Status: DC | PRN

## 2016-11-17 MED ORDER — SODIUM CHLORIDE 0.9 % IV SOLN: 250.0000 mL | INTRAVENOUS | Status: DC | PRN

## 2016-11-17 MED ORDER — CLOPIDOGREL BISULFATE 75 MG PO TABS
75.0000 mg | ORAL_TABLET | Freq: Every day | ORAL | Status: DC
  Administered 2016-11-18 – 2016-11-19 (×2): 75 mg via ORAL
  Filled 2016-11-17 (×3): qty 1

## 2016-11-17 MED ORDER — MORPHINE SULFATE ER 15 MG PO TBCR
15.0000 mg | EXTENDED_RELEASE_TABLET | Freq: Three times a day (TID) | ORAL | Status: DC
  Administered 2016-11-17 – 2016-11-23 (×18): 15 mg via ORAL
  Filled 2016-11-17 (×18): qty 1

## 2016-11-17 MED ORDER — RISAQUAD PO CAPS
1.0000 | ORAL_CAPSULE | Freq: Every day | ORAL | Status: DC
  Administered 2016-11-18 – 2016-11-23 (×6): 1 via ORAL
  Filled 2016-11-17 (×6): qty 1

## 2016-11-17 MED ORDER — PANTOPRAZOLE SODIUM 40 MG PO TBEC
40.0000 mg | DELAYED_RELEASE_TABLET | Freq: Every day | ORAL | Status: DC
  Administered 2016-11-18 – 2016-11-23 (×6): 40 mg via ORAL
  Filled 2016-11-17 (×6): qty 1

## 2016-11-17 MED ORDER — FUROSEMIDE 10 MG/ML IJ SOLN
40.0000 mg | Freq: Once | INTRAMUSCULAR | Status: AC
  Administered 2016-11-17: 40 mg via INTRAVENOUS
  Filled 2016-11-17: qty 4

## 2016-11-17 MED ORDER — HEPARIN SODIUM (PORCINE) 5000 UNIT/ML IJ SOLN
5000.0000 [IU] | Freq: Three times a day (TID) | INTRAMUSCULAR | Status: DC
  Administered 2016-11-17 – 2016-11-19 (×7): 5000 [IU] via SUBCUTANEOUS
  Filled 2016-11-17 (×8): qty 1

## 2016-11-17 MED ORDER — SODIUM CHLORIDE 0.9% FLUSH
3.0000 mL | Freq: Two times a day (BID) | INTRAVENOUS | Status: DC
  Administered 2016-11-17 – 2016-11-23 (×12): 3 mL via INTRAVENOUS

## 2016-11-17 MED ORDER — ALBUTEROL SULFATE (2.5 MG/3ML) 0.083% IN NEBU
2.5000 mg | INHALATION_SOLUTION | Freq: Four times a day (QID) | RESPIRATORY_TRACT | Status: DC | PRN
  Filled 2016-11-17: qty 3

## 2016-11-17 MED ORDER — ONDANSETRON HCL 4 MG/2ML IJ SOLN: 4.0000 mg | Freq: Four times a day (QID) | INTRAMUSCULAR | Status: DC | PRN

## 2016-11-17 MED ORDER — GABAPENTIN 300 MG PO CAPS
600.0000 mg | ORAL_CAPSULE | Freq: Two times a day (BID) | ORAL | Status: DC
  Administered 2016-11-17 – 2016-11-23 (×12): 600 mg via ORAL
  Filled 2016-11-17 (×12): qty 2

## 2016-11-17 MED ORDER — METOPROLOL TARTRATE 100 MG PO TABS
100.0000 mg | ORAL_TABLET | Freq: Two times a day (BID) | ORAL | Status: DC
  Administered 2016-11-17 – 2016-11-23 (×12): 100 mg via ORAL
  Filled 2016-11-17 (×12): qty 1

## 2016-11-17 MED ORDER — OXYCODONE HCL 5 MG PO TABS
5.0000 mg | ORAL_TABLET | Freq: Four times a day (QID) | ORAL | Status: DC | PRN
  Administered 2016-11-18 – 2016-11-23 (×8): 5 mg via ORAL
  Filled 2016-11-17 (×10): qty 1

## 2016-11-17 MED ORDER — IPRATROPIUM-ALBUTEROL 0.5-2.5 (3) MG/3ML IN SOLN
3.0000 mL | Freq: Once | RESPIRATORY_TRACT | Status: AC
  Administered 2016-11-17: 3 mL via RESPIRATORY_TRACT
  Filled 2016-11-17: qty 3

## 2016-11-17 NOTE — ED Notes (Signed)
Tried calling report to 3E and since patient is on a NRB, the floor will not take her.

## 2016-11-17 NOTE — ED Notes (Signed)
Dr. Eulas Post is on way to reassess patient to see if patient may need BiPAP after blood gas is done.

## 2016-11-17 NOTE — ED Notes (Signed)
Unsuccessful attempt at giving report to 3E. 

## 2016-11-17 NOTE — H&P (Signed)
History and Physical    [name] [name] PFX:[id number] DOB: 1931-05-26 DOA: 11/17/2016  PCP: Mathews Argyle, MD  Patient coming from:SNF  Chief Complaint: SOB  HPI: [name] [name] is a 83 y.o. female with medical history significant of she was hypersomnolent and does not provide history. History is obtained from ER physician and notes. Essentially patient presented to the hospital after being found to be more short of breath. The problem occurred today and as result patient was transitioned to the hospital for further evaluation recommendations. The patient is on 3 L of oxygen at home but required more oxygen.   ED Course: Patient was found to be hypoxic on her 3 L and required increased oxygen requirements. She was found to have a mild leukocytosis and chest x-ray reported vascular congestion. Patient was also found to have elevated BNP  Review of Systems: Unable to assess secondary to hypersomnolence and limited patient cooperation  Past Medical History:  Diagnosis Date  . Atrial fibrillation ([place])   . CAD (coronary artery disease)    a. NSTEMI 2/14 => LHC 10/20/12:  dLM 20%, pLAD 99%, mLAD 60% followed by 99%, oD1 99%, and pD2 30%, mild plaque disease in the CFX and RCA. PCI: Xience Xpedition DES to the proximal and mid LAD  . Chronic diastolic CHF (congestive heart failure) ([place])    Echo (08/10/13): Mild LVH, EF 60-65%.  . DJD (degenerative joint disease)   . Dyslipidemia   . HTN (hypertension)   . Osteopenia   . Thrombocytopenia ([place])   . Venous ulcer     Past Surgical History:  Procedure Laterality Date  . BACK SURGERY     Lumbar disc  . EYE SURGERY     Blepharoplasty  . HIP SURGERY     R THR  . INSERTION / PLACEMENT / REVISION NEUROSTIMULATOR    . JOINT REPLACEMENT    . LEFT HEART CATHETERIZATION WITH CORONARY ANGIOGRAM N/A 10/20/2012   Procedure: LEFT HEART CATHETERIZATION WITH CORONARY ANGIOGRAM;  Surgeon: Burnell Blanks, MD;  Location[facility] C[facility] LAB;   Service: Cardiovascular;  Laterality: N/A;     reports that she has never smoked. She has never used smokeless tobacco. She reports that she does not drink alcohol or use drugs.  Allergies  Allergen Reactions  . Keflex [Cephalexin] Other (See Comments)    intolerance    Family History  Problem Relation Age of Onset  . CAD Father 61    Vague history     Prior to Admission medications   Medication Sig Start Date End Date Taking? Authorizing Provider  albuterol (PROVENTIL) (2.5 MG/3ML) 0.083% nebulizer solution Take 2.5 mg by nebulization every 6 (six) hours as needed for wheezing or shortness of breath.   Yes Historical Provider, MD  Amino Acids-Protein Hydrolys (FEEDING SUPPLEMENT, PRO-STAT SUGAR FREE 64,) LIQD Take 30 mLs by mouth 2 (two) times daily.   Yes Historical Provider, MD  Calcium Carbonate-Vitamin D (CALCARB 600/D PO) Take 2 tablets by mouth daily.   Yes Historical Provider, MD  clopidogrel (PLAVIX) 75 MG tablet Take 1 tablet (75 mg total) by mouth daily with breakfast. 08/18/13  Yes Velvet Bathe, MD  digoxin 62.5 MCG TABS Take 0.0625 mg by mouth daily. 09/21/16  Yes Jennifer Chahn-Yang Choi, DO  diltiazem (CARDIZEM CD) 240 MG 24 hr capsule Take 1 capsule (240 mg total) by mouth daily. 08/18/13  Yes Velvet Bathe, MD  famciclovir (FAMVIR) 250 MG tablet Take 1 tablet (250 mg total) by mouth daily. 08/03/16  Yes Eliezer Bottom, NP  gabapentin (NEURONTIN) 600 MG tablet Take 1 tablet (600 mg total) by mouth 2 (two) times daily. 09/20/16  Yes Jennifer Chahn-Yang Choi, DO  Lactobacillus Acid-Pectin (ACIDOPHILUS/CITRUS PECTIN) TABS Take 1 tablet by mouth daily.   Yes Historical Provider, MD  metoprolol (LOPRESSOR) 100 MG tablet Take 1 tablet (100 mg total) by mouth 2 (two) times daily. 08/18/13  Yes Velvet Bathe, MD  morphine (KADIAN) 20 MG 24 hr capsule Take 20 mg by mouth 2 (two) times daily.    Yes Historical Provider, MD  oxyCODONE (OXY IR/ROXICODONE) 5 MG immediate release tablet  Take 1 tablet (5 mg total) by mouth every 6 (six) hours as needed for breakthrough pain (pain). Patient taking differently: Take 5 mg by mouth 4 (four) times daily.  08/18/13  Yes Velvet Bathe, MD  pantoprazole (PROTONIX) 40 MG tablet Take 1 tablet (40 mg total) by mouth daily. 08/03/16  Yes Eliezer Bottom, NP  Polyethyl Glycol-Propyl Glycol (SYSTANE OP) Place 1 drop into both eyes 2 (two) times daily.   Yes Historical Provider, MD  potassium chloride SA (K-DUR,KLOR-CON) 20 MEQ tablet Take 1 tablet (20 mEq total) by mouth 2 (two) times daily. 08/28/13  Yes Liliane Shi, PA-C  sodium chloride (OCEAN) 0.65 % SOLN nasal spray Place 1 spray into both nostrils 3 (three) times daily.    Yes Historical Provider, MD  torsemide (DEMADEX) 20 MG tablet Take 160 mg by mouth 2 (two) times daily.  11/17/16 11/19/16 Yes Historical Provider, MD  predniSONE (DELTASONE) 10 MG tablet Take 4 tabs for 3 days, then 3 tabs for 3 days, then 2 tabs for 3 days, then 1 tab for 3 days, then 1/2 tab for 4 days. Patient not taking: Reported on 11/17/2016 09/20/16   Shon Millet, DO    Physical Exam: Vitals:   11/17/16 1630 11/17/16 1645 11/17/16 1700 11/17/16 1715  BP: 106/72 123/81 133/66 106/62  Pulse: 63 65 (!) 45 (!) 26  Resp: (!) _0 Temp:      TempSrc:      SpO2: (!) 87% (!) 88% 95% 90%  Weight:      Height:        Constitutional: NAD, calm, comfortable Vitals:   11/17/16 1630 11/17/16 1645 11/17/16 1700 11/17/16 1715  BP: 106/72 123/81 133/66 106/62  Pulse: 63 65 (!) 45 (!) 26  Resp: (!) _1 Temp:      TempSrc:      SpO2: (!) 87% (!) 88% 95% 90%  Weight:      Height:       Eyes: PERRL, lids and conjunctivae normal ENMT: Mucous membranes are moist. Posterior pharynx clear of any exudate or lesions. Neck: normal, supple, no masses, no thyromegaly Respiratory: Equal chest rise, tachypnea, no rales, decreased breath sounds at bases Cardiovascular: Irregularly irregular, no  murmurs or rubs Abdomen: no tenderness, no masses palpated. No hepatosplenomegaly. Bowel sounds positive.  Musculoskeletal: no cyanosis, equal tone Skin: Pt has lower extremities wrapped. No ulcers on limited exam. Neurologic: No facial asymmetry, limited patient cooperation and hypersomnolence with patient arousable to noxious stimuli Psychiatric: Unable to assess secondary to hypersomnolence and limited patient cooperation  Labs on Admission: I have personally reviewed following labs and imaging studies  CBC:  Recent Labs Lab 11/14/16 1020 11/17/16 1500  WBC 10.6* 11.3*  NEUTROABS 8.9*  --   HGB 11.8 11.2*  HCT 40.7 40.3  MCV 87 88.2  PLT  24 Platelet count confirmed by slide estimate* PLATELET CLUMPS NOTED ON SMEAR, COUNT APPEARS DECREASED   Basic Metabolic Panel:  Recent Labs Lab 11/14/16 1020 11/17/16 1500  NA 140 136  K 4.3 4.3  CL  --  97*  CO2 32* 30  GLUCOSE 173* 148*  BUN 38.9* 42*  CREATININE 1.2* 1.38*  CALCIUM 9.3 8.8*   GFR: Estimated Creatinine Clearance: 31.9 mL/min (by C-G formula based on SCr of 1.38 mg/dL (H)). Liver Function Tests:  Recent Labs Lab 11/14/16 1020  AST 12  ALT <6  ALKPHOS 74  BILITOT 1.23*  PROT 6.9  ALBUMIN 3.2*   No results for input(s): LIPASE, AMYLASE in the last 168 hours. No results for input(s): AMMONIA in the last 168 hours. Coagulation Profile: No results for input(s): INR, PROTIME in the last 168 hours. Cardiac Enzymes: No results for input(s): CKTOTAL, CKMB, CKMBINDEX, TROPONINI in the last 168 hours. BNP (last 3 results) No results for input(s): PROBNP in the last 8760 hours. HbA1C: No results for input(s): HGBA1C in the last 72 hours. CBG: No results for input(s): GLUCAP in the last 168 hours. Lipid Profile: No results for input(s): CHOL, HDL, LDLCALC, TRIG, CHOLHDL, LDLDIRECT in the last 72 hours. Thyroid Function Tests: No results for input(s): TSH, T4TOTAL, FREET4, T3FREE, THYROIDAB in the last 72  hours. Anemia Panel: No results for input(s): VITAMINB12, FOLATE, FERRITIN, TIBC, IRON, RETICCTPCT in the last 72 hours. Urine analysis:    Component Value Date/Time   COLORURINE YELLOW 09/12/2016 [place] 09/12/2016 0438   LABSPEC 1.008 09/12/2016 0438   PHURINE 6.0 09/12/2016 0438   GLUCOSEU NEGATIVE 09/12/2016 0438   HGBUR NEGATIVE 09/12/2016 0438   BILIRUBINUR NEGATIVE 09/12/2016 0438   KETONESUR NEGATIVE 09/12/2016 0438   PROTEINUR NEGATIVE 09/12/2016 0438   UROBILINOGEN 0.2 08/10/2013 0037   NITRITE NEGATIVE 09/12/2016 0438   LEUKOCYTESUR TRACE (A) 09/12/2016 0438    Radiological Exams on Admission: Dg Chest 2 View  Result Date: 11/17/2016 CLINICAL DATA:  Fluid retention EXAM: CHEST  2 VIEW COMPARISON:  09/13/2016 FINDINGS: Chronic cardiomegaly and vascular pedicle widening. Diffuse interstitial opacity that is similar to multiple priors. There is low volumes with interstitial crowding. No air bronchogram, effusion or pneumothorax. Severe glenohumeral osteoarthritis. Spinal fusion and dorsal column stimulator. IMPRESSION: Cardiomegaly and chronic pulmonary venous congestion. When accounting for chronic interstitial markings, no definitive edema. Electronically Signed   By: Monte Fantasia M.D.   On: 11/17/2016 16:00    EKG: Independently reviewed. Atrial fibrillation with appropriate ventricular response no ST elevations or depressions  Assessment/Plan Active Problems:   Acute respiratory failure with hypoxia ([place]): Secondary to CHF - Telemetry monitoring - Heart healthy diet with fluid restriction - Patient is on oxygen at home but requiring higher oxygen requirements. Monitor intake and output and daily weights    HTN (hypertension)   Dyslipidemia   Chronic back pain - Continue home pain medication regimen    CAD (coronary artery disease) of artery bypass graft - Patient on Plavix    Atrial fibrillation ([place]) - Continue rate controlling  medication - Patient is on Plavix and not on any more anticoagulation. She is DO NOT RESUSCITATE and I suspect she is a fall risk    Acute CHF (congestive heart failure) ([place]) - Obtain echocardiogram for category cessation. Most likely diastolic heart failure   DVT prophylaxis: Heparin Code Status: DO NOT RESUSCITATE Family Communication: None at bedside Disposition Plan: Pending improvement in condition Consults called: None Admission status:  Observation   Velvet Bathe MD [facility] Pager [phone]  If 7PM-7AM, please contact night-coverage [email or web address] Password [facility]  11/17/2016, 5:36 PM

## 2016-11-17 NOTE — ED Notes (Signed)
Contacted RT to attempt blood gas, arterial.

## 2016-11-17 NOTE — ED Notes (Signed)
On way to XR

## 2016-11-17 NOTE — ED Triage Notes (Signed)
Per EMS, patient is from [place] and has been seen by doctor due to fluid retention.  Xray shows continued fluid retention even though increasing diuretics. Doctor wanted patient seen at ED.  No hx of CHF. Hx of COPD.  No increased work of breathing.  Denying SOB and pain.  Normally on 3L at home in which she usually sats  85%.  Patient on 5L at this time at 87-91%.  Lungs clear bilaterally.  Speaking in complete sentences.  124/64, 80 HR, 91% 5L

## 2016-11-17 NOTE — Progress Notes (Signed)
Patient evaluated for hypoxia in the ED.  Admitted earlier today by [facility] for CHF exacerbation.  She has put out at least 1L since her first dose of Lasix.  She is alert and oriented, asking for her book because she is behind on her reading.  When asked about her O2 sats at home, she reports "I think my machine is broken because it never really goes above 90".  ABG obtained, reviewed, discussed with RT.  PCO2 is elevated but pH is normal, so this is chronic.  She is certainly hypoxic as well, but based on her history, this is chronic, at least to some degree, as well.  I do not think the patient needs BiPAP at this time.  Will wean to high flow [place] for now.  Will tolerate O2 sats 88-92% as long as the patient is alert and oriented.

## 2016-11-17 NOTE — ED Provider Notes (Signed)
[facility] DEPT Provider Note   CSN: [id number] Arrival date & time: 11/17/16  1440     History   Chief Complaint Chief Complaint  Patient presents with  . Fluid retention    HPI [name] [name] is a 84 y.o. female.  HPI Patient sent in from the nursing home. Likely fluid retention. Has had worsening oxygen saturation. Around 5 days ago had hypoxia with sats in 85%. Increased oxygen at that time. Presumed to be due to CHF. Increased Lasix that time. Also appears to been started on Levaquin. Here found be continued hypoxia and increased oxygen. Patient states she has had a little bit of a cough last couple days. No fevers. No chest pain. Does have some trouble breathing.   Past Medical History:  Diagnosis Date  . Atrial fibrillation ([place])   . CAD (coronary artery disease)    a. NSTEMI 2/14 => LHC 10/20/12:  dLM 20%, pLAD 99%, mLAD 60% followed by 99%, oD1 99%, and pD2 30%, mild plaque disease in the CFX and RCA. PCI: Xience Xpedition DES to the proximal and mid LAD  . Chronic diastolic CHF (congestive heart failure) ([place])    Echo (08/10/13): Mild LVH, EF 60-65%.  . DJD (degenerative joint disease)   . Dyslipidemia   . HTN (hypertension)   . Osteopenia   . Thrombocytopenia ([place])   . Venous ulcer     Patient Active Problem List   Diagnosis Date Noted  . Chronic ITP (idiopathic thrombocytopenia) (HCC) 10/01/2016  . HCAP (healthcare-associated pneumonia) 09/12/2016  . Gangrene ([place])   . Acute respiratory failure with hypoxia ([place])   . Coronary atherosclerosis of native coronary artery 10/09/2013  . Atrial fibrillation ([place]) 10/09/2013  . Chronic diastolic heart failure ([place]) 10/09/2013  . Leukocytosis 08/11/2013  . Sepsis ([place]) 08/10/2013  . Cellulitis of leg 08/10/2013  . Atrial fibrillation with RVR ([place]) 08/10/2013  . Cellulitis of left lower extremity 07/16/2013  . Chronic narcotic dependence ([place]) 07/16/2013  . Varicose veins of lower extremities with other  complications 44/97/5300  . Edema 05/14/2013  . Bacteremia 12/26/2012  . Severe sepsis ([place]) 12/26/2012  . Cholecystitis, acute 12/20/2012  . Hypokalemia 12/20/2012  . CAD (coronary artery disease) of artery bypass graft 12/18/2012  . Unstable angina ([place]) 10/18/2012  . Thrombocytopenia ([place]) 10/18/2012  . Chest pain 10/17/2012  . HTN (hypertension) 10/17/2012  . Dyslipidemia 10/17/2012  . Chronic back pain 10/17/2012  . Precordial pain 10/17/2012    Past Surgical History:  Procedure Laterality Date  . BACK SURGERY     Lumbar disc  . EYE SURGERY     Blepharoplasty  . HIP SURGERY     R THR  . INSERTION / PLACEMENT / REVISION NEUROSTIMULATOR    . JOINT REPLACEMENT    . LEFT HEART CATHETERIZATION WITH CORONARY ANGIOGRAM N/A 10/20/2012   Procedure: LEFT HEART CATHETERIZATION WITH CORONARY ANGIOGRAM;  Surgeon: Burnell Blanks, MD;  Location: [facility] CATH LAB;  Service: Cardiovascular;  Laterality: N/A;    OB History    No data available       Home Medications    Prior to Admission medications   Medication Sig Start Date End Date Taking? Authorizing Provider  albuterol (PROVENTIL) (2.5 MG/3ML) 0.083% nebulizer solution Take 2.5 mg by nebulization every 6 (six) hours as needed for wheezing or shortness of breath.   Yes Historical Provider, MD  Amino Acids-Protein Hydrolys (FEEDING SUPPLEMENT, PRO-STAT SUGAR FREE 64,) LIQD Take 30 mLs by mouth 2 (two) times daily.  Yes Historical Provider, MD  Calcium Carbonate-Vitamin D (CALCARB 600/D PO) Take 2 tablets by mouth daily.   Yes Historical Provider, MD  clopidogrel (PLAVIX) 75 MG tablet Take 1 tablet (75 mg total) by mouth daily with breakfast. 08/18/13  Yes Velvet Bathe, MD  digoxin 62.5 MCG TABS Take 0.0625 mg by mouth daily. 09/21/16  Yes Jennifer Chahn-Yang Choi, DO  diltiazem (CARDIZEM CD) 240 MG 24 hr capsule Take 1 capsule (240 mg total) by mouth daily. 08/18/13  Yes Velvet Bathe, MD  famciclovir (FAMVIR) 250 MG tablet Take 1  tablet (250 mg total) by mouth daily. 08/03/16  Yes Eliezer Bottom, NP  gabapentin (NEURONTIN) 600 MG tablet Take 1 tablet (600 mg total) by mouth 2 (two) times daily. 09/20/16  Yes Jennifer Chahn-Yang Choi, DO  Lactobacillus Acid-Pectin (ACIDOPHILUS/CITRUS PECTIN) TABS Take 1 tablet by mouth daily.   Yes Historical Provider, MD  metoprolol (LOPRESSOR) 100 MG tablet Take 1 tablet (100 mg total) by mouth 2 (two) times daily. 08/18/13  Yes Velvet Bathe, MD  morphine (KADIAN) 20 MG 24 hr capsule Take 20 mg by mouth 2 (two) times daily.    Yes Historical Provider, MD  oxyCODONE (OXY IR/ROXICODONE) 5 MG immediate release tablet Take 1 tablet (5 mg total) by mouth every 6 (six) hours as needed for breakthrough pain (pain). Patient taking differently: Take 5 mg by mouth 4 (four) times daily.  08/18/13  Yes Velvet Bathe, MD  pantoprazole (PROTONIX) 40 MG tablet Take 1 tablet (40 mg total) by mouth daily. 08/03/16  Yes Eliezer Bottom, NP  Polyethyl Glycol-Propyl Glycol (SYSTANE OP) Place 1 drop into both eyes 2 (two) times daily.   Yes Historical Provider, MD  potassium chloride SA (K-DUR,KLOR-CON) 20 MEQ tablet Take 1 tablet (20 mEq total) by mouth 2 (two) times daily. 08/28/13  Yes Liliane Shi, PA-C  sodium chloride (OCEAN) 0.65 % SOLN nasal spray Place 1 spray into both nostrils 3 (three) times daily.    Yes Historical Provider, MD  torsemide (DEMADEX) 20 MG tablet Take 160 mg by mouth 2 (two) times daily.  11/17/16 11/19/16 Yes Historical Provider, MD  predniSONE (DELTASONE) 10 MG tablet Take 4 tabs for 3 days, then 3 tabs for 3 days, then 2 tabs for 3 days, then 1 tab for 3 days, then 1/2 tab for 4 days. Patient not taking: Reported on 11/17/2016 09/20/16   Shon Millet, DO    Family History Family History  Problem Relation Age of Onset  . CAD Father 43    Vague history     Social History Social History  Substance Use Topics  . Smoking status: Never Smoker  . Smokeless tobacco:  Never Used  . Alcohol use No     Allergies   Keflex [cephalexin]   Review of Systems Review of Systems  Constitutional: Positive for appetite change and fever.  HENT: Negative for congestion.   Respiratory: Positive for cough and shortness of breath.   Cardiovascular: Positive for leg swelling. Negative for chest pain.  Gastrointestinal: Negative for abdominal pain.  Genitourinary: Negative for enuresis.  Musculoskeletal: Negative for back pain.  Neurological: Negative for light-headedness.  Hematological: Negative for adenopathy.  Psychiatric/Behavioral: Negative for confusion.     Physical Exam Updated Vital Signs BP 106/72   Pulse 63   Temp 99.8 F (37.7 C) (Rectal)   Resp (!) 28   Ht _0  (1.575 m)   Wt 195 lb (88.5 kg)   SpO2 (!) 87%  BMI 35.67 kg/m   Physical Exam  Constitutional: She appears well-developed.  HENT:  Head: Atraumatic.  Eyes: Pupils are equal, round, and reactive to light.  Neck: No JVD present.  Cardiovascular: Normal rate.   Pulmonary/Chest:  Mildly harsh breath sounds with some prolonged expirations.  Abdominal: There is no tenderness.  Musculoskeletal: She exhibits edema.  Pitting edema to bilateral lower extremity's. Decreased per patient.  Neurological: She is alert.  Skin: Skin is warm. Capillary refill takes less than 2 seconds.     ED Treatments / Results  Labs (all labs ordered are listed, but only abnormal results are displayed) Labs Reviewed  BASIC METABOLIC PANEL - Abnormal; Notable for the following:       Result Value   Chloride 97 (*)    Glucose, Bld 148 (*)    BUN 42 (*)    Creatinine, Ser 1.38 (*)    Calcium 8.8 (*)    GFR calc non Af Amer 34 (*)    GFR calc Af Amer 40 (*)    All other components within normal limits  CBC - Abnormal; Notable for the following:    WBC 11.3 (*)    Hemoglobin 11.2 (*)    MCH 24.5 (*)    MCHC 27.8 (*)    RDW 19.1 (*)    All other components within normal limits  BRAIN  NATRIURETIC PEPTIDE - Abnormal; Notable for the following:    B Natriuretic Peptide 1,492.9 (*)    All other components within normal limits  I-STAT TROPOININ, ED    EKG  EKG Interpretation  Date/Time:  Saturday November 17 2016 14:50:14 EST Ventricular Rate:  85 PR Interval:    QRS Duration: 92 QT Interval:  311 QTC Calculation: 370 R Axis:   -166 Text Interpretation:  Atrial fibrillation Probable right ventricular hypertrophy Abnormal T, consider ischemia, diffuse leads Confirmed by Alvino Chapel  MD, Erryn Dickison [phone]) on 11/17/2016 4:02:57 PM       Radiology Dg Chest 2 View  Result Date: 11/17/2016 CLINICAL DATA:  Fluid retention EXAM: CHEST  2 VIEW COMPARISON:  09/13/2016 FINDINGS: Chronic cardiomegaly and vascular pedicle widening. Diffuse interstitial opacity that is similar to multiple priors. There is low volumes with interstitial crowding. No air bronchogram, effusion or pneumothorax. Severe glenohumeral osteoarthritis. Spinal fusion and dorsal column stimulator. IMPRESSION: Cardiomegaly and chronic pulmonary venous congestion. When accounting for chronic interstitial markings, no definitive edema. Electronically Signed   By: Monte Fantasia M.D.   On: 11/17/2016 16:00    Procedures Procedures (including critical care time)  Medications Ordered in ED Medications  furosemide (LASIX) injection 40 mg (not administered)  ipratropium-albuterol (DUONEB) 0.5-2.5 (3) MG/3ML nebulizer solution 3 mL (3 mLs Nebulization Given 11/17/16 1632)     Initial Impression / Assessment and Plan / ED Course  I have reviewed the triage vital signs and the nursing notes.  Pertinent labs & imaging results that were available during my care of the patient were reviewed by me and considered in my medical decision making (see chart for details).     Patient presents with shortness of breath and hypoxia. Sent in by Dr. for continued fluid retention. Has increased diuretics at nursing home. Normally on 3 L  and sats had been 85% a few days ago which was a decrease for her. Now on 5 L and still in the upper 80s. Mildly harsh breath sounds. BNP is elevated. No fever. Will admit to internal medicine  .Final Clinical Impressions(s) / ED Diagnoses  Final diagnoses:  Acute on chronic congestive heart failure, unspecified congestive heart failure type ([place])  Hypoxia    New Prescriptions New Prescriptions   No medications on file     Davonna Belling, MD 11/17/16 1712

## 2016-11-17 NOTE — ED Notes (Addendum)
Pt's oxygen dropping to 78-80% on 5L of O2.  Per EDP, placed patient on NRB. Set O2 at 40% due to patient having hx of COPD.

## 2016-11-17 NOTE — ED Notes (Signed)
Per Dr. Eulas Post, weaning patient to high flow Box.  Will be satisfied with 88-92%. Dr. Eulas Post adding note to chart.

## 2016-11-17 NOTE — ED Notes (Signed)
Contacting Dr. Eulas Post so she can reassess patient and place them in appropriate bed assignment.

## 2016-11-18 ENCOUNTER — Observation Stay (HOSPITAL_COMMUNITY): Payer: Medicare Other

## 2016-11-18 DIAGNOSIS — Z969 Presence of functional implant, unspecified: Secondary | ICD-10-CM | POA: Diagnosis present

## 2016-11-18 DIAGNOSIS — N183 Chronic kidney disease, stage 3 (moderate): Secondary | ICD-10-CM | POA: Diagnosis present

## 2016-11-18 DIAGNOSIS — I252 Old myocardial infarction: Secondary | ICD-10-CM | POA: Diagnosis not present

## 2016-11-18 DIAGNOSIS — J9601 Acute respiratory failure with hypoxia: Secondary | ICD-10-CM

## 2016-11-18 DIAGNOSIS — M546 Pain in thoracic spine: Secondary | ICD-10-CM | POA: Diagnosis not present

## 2016-11-18 DIAGNOSIS — D649 Anemia, unspecified: Secondary | ICD-10-CM | POA: Diagnosis not present

## 2016-11-18 DIAGNOSIS — M549 Dorsalgia, unspecified: Secondary | ICD-10-CM | POA: Diagnosis present

## 2016-11-18 DIAGNOSIS — Z7189 Other specified counseling: Secondary | ICD-10-CM | POA: Diagnosis not present

## 2016-11-18 DIAGNOSIS — I872 Venous insufficiency (chronic) (peripheral): Secondary | ICD-10-CM | POA: Diagnosis not present

## 2016-11-18 DIAGNOSIS — J9621 Acute and chronic respiratory failure with hypoxia: Secondary | ICD-10-CM | POA: Diagnosis present

## 2016-11-18 DIAGNOSIS — Z6834 Body mass index (BMI) 34.0-34.9, adult: Secondary | ICD-10-CM | POA: Diagnosis not present

## 2016-11-18 DIAGNOSIS — I2510 Atherosclerotic heart disease of native coronary artery without angina pectoris: Secondary | ICD-10-CM | POA: Diagnosis present

## 2016-11-18 DIAGNOSIS — Z9981 Dependence on supplemental oxygen: Secondary | ICD-10-CM | POA: Diagnosis not present

## 2016-11-18 DIAGNOSIS — I83003 Varicose veins of unspecified lower extremity with ulcer of ankle: Secondary | ICD-10-CM | POA: Diagnosis not present

## 2016-11-18 DIAGNOSIS — G8929 Other chronic pain: Secondary | ICD-10-CM | POA: Diagnosis present

## 2016-11-18 DIAGNOSIS — J9620 Acute and chronic respiratory failure, unspecified whether with hypoxia or hypercapnia: Secondary | ICD-10-CM | POA: Diagnosis not present

## 2016-11-18 DIAGNOSIS — Z515 Encounter for palliative care: Secondary | ICD-10-CM | POA: Diagnosis not present

## 2016-11-18 DIAGNOSIS — D631 Anemia in chronic kidney disease: Secondary | ICD-10-CM | POA: Diagnosis present

## 2016-11-18 DIAGNOSIS — E669 Obesity, unspecified: Secondary | ICD-10-CM | POA: Diagnosis present

## 2016-11-18 DIAGNOSIS — I2723 Pulmonary hypertension due to lung diseases and hypoxia: Secondary | ICD-10-CM | POA: Diagnosis present

## 2016-11-18 DIAGNOSIS — D693 Immune thrombocytopenic purpura: Secondary | ICD-10-CM | POA: Diagnosis present

## 2016-11-18 DIAGNOSIS — I130 Hypertensive heart and chronic kidney disease with heart failure and stage 1 through stage 4 chronic kidney disease, or unspecified chronic kidney disease: Secondary | ICD-10-CM | POA: Diagnosis present

## 2016-11-18 DIAGNOSIS — Z66 Do not resuscitate: Secondary | ICD-10-CM | POA: Diagnosis present

## 2016-11-18 DIAGNOSIS — Z96641 Presence of right artificial hip joint: Secondary | ICD-10-CM | POA: Diagnosis present

## 2016-11-18 DIAGNOSIS — I83228 Varicose veins of left lower extremity with both ulcer of other part of lower extremity and inflammation: Secondary | ICD-10-CM | POA: Diagnosis present

## 2016-11-18 DIAGNOSIS — J9600 Acute respiratory failure, unspecified whether with hypoxia or hypercapnia: Secondary | ICD-10-CM | POA: Diagnosis not present

## 2016-11-18 DIAGNOSIS — I482 Chronic atrial fibrillation: Secondary | ICD-10-CM | POA: Diagnosis present

## 2016-11-18 DIAGNOSIS — I10 Essential (primary) hypertension: Secondary | ICD-10-CM | POA: Diagnosis not present

## 2016-11-18 DIAGNOSIS — L97309 Non-pressure chronic ulcer of unspecified ankle with unspecified severity: Secondary | ICD-10-CM | POA: Diagnosis not present

## 2016-11-18 DIAGNOSIS — I5033 Acute on chronic diastolic (congestive) heart failure: Secondary | ICD-10-CM | POA: Diagnosis present

## 2016-11-18 DIAGNOSIS — E785 Hyperlipidemia, unspecified: Secondary | ICD-10-CM

## 2016-11-18 DIAGNOSIS — R0600 Dyspnea, unspecified: Secondary | ICD-10-CM | POA: Diagnosis not present

## 2016-11-18 DIAGNOSIS — R609 Edema, unspecified: Secondary | ICD-10-CM | POA: Diagnosis present

## 2016-11-18 DIAGNOSIS — I509 Heart failure, unspecified: Secondary | ICD-10-CM | POA: Diagnosis not present

## 2016-11-18 DIAGNOSIS — I25810 Atherosclerosis of coronary artery bypass graft(s) without angina pectoris: Secondary | ICD-10-CM | POA: Diagnosis not present

## 2016-11-18 DIAGNOSIS — Z22322 Carrier or suspected carrier of Methicillin resistant Staphylococcus aureus: Secondary | ICD-10-CM | POA: Diagnosis not present

## 2016-11-18 DIAGNOSIS — I83218 Varicose veins of right lower extremity with both ulcer of other part of lower extremity and inflammation: Secondary | ICD-10-CM | POA: Diagnosis present

## 2016-11-18 DIAGNOSIS — M1990 Unspecified osteoarthritis, unspecified site: Secondary | ICD-10-CM | POA: Diagnosis present

## 2016-11-18 DIAGNOSIS — D696 Thrombocytopenia, unspecified: Secondary | ICD-10-CM | POA: Diagnosis not present

## 2016-11-18 DIAGNOSIS — I4891 Unspecified atrial fibrillation: Secondary | ICD-10-CM | POA: Diagnosis not present

## 2016-11-18 LAB — BASIC METABOLIC PANEL
ANION GAP: 8 (ref 5–15)
BUN: 41 mg/dL — AB (ref 6–20)
CALCIUM: 8.6 mg/dL — AB (ref 8.9–10.3)
CO2: 32 mmol/L (ref 22–32)
CREATININE: 1.32 mg/dL — AB (ref 0.44–1.00)
Chloride: 99 mmol/L — ABNORMAL LOW (ref 101–111)
GFR calc Af Amer: 42 mL/min — ABNORMAL LOW (ref >60)
GFR, EST NON AFRICAN AMERICAN: 36 mL/min — AB (ref >60)
GLUCOSE: 122 mg/dL — AB (ref 65–99)
Potassium: 4.4 mmol/L (ref 3.5–5.1)
Sodium: 139 mmol/L (ref 135–145)

## 2016-11-18 LAB — TROPONIN I
Troponin I: <0.03 ng/mL (ref <0.03)
Troponin I: <0.03 ng/mL (ref <0.03)

## 2016-11-18 LAB — ECHOCARDIOGRAM COMPLETE
Height: 64 in
Weight: 3224 oz

## 2016-11-18 LAB — MRSA PCR SCREENING: MRSA BY PCR: POSITIVE — AB

## 2016-11-18 MED ORDER — PERFLUTREN LIPID MICROSPHERE
1.0000 mL | INTRAVENOUS | Status: AC | PRN
  Administered 2016-11-18: 3 mL via INTRAVENOUS
  Filled 2016-11-18: qty 10

## 2016-11-18 MED ORDER — POLYVINYL ALCOHOL 1.4 % OP SOLN
2.0000 [drp] | Freq: Two times a day (BID) | OPHTHALMIC | Status: DC
  Administered 2016-11-18 – 2016-11-23 (×10): 2 [drp] via OPHTHALMIC
  Filled 2016-11-18: qty 15

## 2016-11-18 MED ORDER — MUPIROCIN 2 % EX OINT
1.0000 "application " | TOPICAL_OINTMENT | Freq: Two times a day (BID) | CUTANEOUS | Status: AC
  Administered 2016-11-18 – 2016-11-22 (×10): 1 via NASAL
  Filled 2016-11-18 (×3): qty 22

## 2016-11-18 MED ORDER — CHLORHEXIDINE GLUCONATE CLOTH 2 % EX PADS
6.0000 | MEDICATED_PAD | Freq: Every day | CUTANEOUS | Status: AC
  Administered 2016-11-18 – 2016-11-21 (×3): 6 via TOPICAL

## 2016-11-18 NOTE — Progress Notes (Addendum)
Patient ID: [name] [name], female   DOB: [birth year], 83 y.o.   MRN: [id number]  PROGRESS NOTE    [name] [name]  VQM:[id number] DOB: [birth year] DOA: 11/17/2016  PCP: Mathews Argyle, MD   Brief Narrative:  83 y.o. female with medical history significant  For atrial fibrillation, CAD with DES to proximal and mid LAD, chronic diastolic CHF (ECHO in 93/2671 showed preserved EF), hypertension, DJD. She presented to [facility] ED with shortness of breath. On admission, she was hypoxic but this has improved with [place] oxygen support. Her blood work showed BNP 1492, troponin WNL, creatinine was 1.38, WBC count 11.3. CXR showed cardiomegaly and chronic pulmonary venous congestion. She was started on IV lasix.  Assessment & Plan:   Active Problems: Acute on chronic diastolic CHF / Acute respiratory failure with hypoxia  - Last 2 D ECHO in 08/2016 with preserved EF - BNP on this admission in 1490 range - On IV lasix 40 mg IV BID - Continue metoprolol - Continue digoxin  - Cr remains stable - Weight on admission 88.5 kg and this am 91.4 kg (not sure if the am weight accurate) - May need cardio consult in am depending on her weight progress and diureses     HTN (hypertension), essential - Continue metoprolol and Cardizem    Atrial fibrillation - CHADS vasc score 4 - On anticoagulation with plavix - Rate controlled with metoprolol and Cardizem     Chronic back pain - Continue pain management efforts    CAD (coronary artery disease) of artery bypass graft - Continue plavix    Chronic kidney disease stage 3 - Baseline cr 1.13 in 10/2015 - Cr slightly above baseline - Will monitor daily considering pt on IV lasix      ?thrombocytopenia - Repeat CBC, poor IV access so will repeat CBC in am   DVT prophylaxis: subQ heparin but will repeat CBC to make sure that the platelet count is correct rather than lab error; stop hep subQ Code Status: full code  Family Communication: no family at the  bedside this am Disposition Plan: home once diuresed adequately    Consultants:   None   Procedures:   None   Antimicrobials:   None    Subjective: No overnight events.   Objective: Vitals:   11/17/16 1915 11/17/16 1930 11/17/16 2137 11/18/16 0635  BP: 122/91 (!) 144/107 (!) 101/53 130/80  Pulse: 60 (!) 39 77 88  Resp: 19 22 (!) 22   Temp:   98 F (36.7 C) 98.1 F (36.7 C)  TempSrc:   Oral Oral  SpO2: 96% 95% 93% 92%  Weight:   93.5 kg (206 lb 1.6 oz) 91.4 kg (201 lb 8 oz)  Height:   _0  (1.626 m)     Intake/Output Summary (Last 24 hours) at 11/18/16 0908 Last data filed at 11/18/16 2458  Gross per 24 hour  Intake              480 ml  Output              300 ml  Net              180 ml   Filed Weights   11/17/16 1450 11/17/16 2137 11/18/16 0635  Weight: 88.5 kg (195 lb) 93.5 kg (206 lb 1.6 oz) 91.4 kg (201 lb 8 oz)    Examination:  General exam: Appears calm and comfortable  Respiratory system: rales at bases  Cardiovascular system: S1 & S2  heard, Rate controlled  Gastrointestinal system: Abdomen is nondistended, soft and nontender. No organomegaly or masses felt. Normal bowel sounds heard. Central nervous system: Alert and oriented. No focal neurological deficits. Extremities: Symmetric 5 x 5 power; ACE wrapping both LE Skin: No rashes, lesions or ulcers Psychiatry: Judgement and insight appear normal. Mood & affect appropriate.   Data Reviewed: I have personally reviewed following labs and imaging studies  CBC:  Recent Labs Lab 11/14/16 1020 11/17/16 1500 11/17/16 2148  WBC 10.6* 11.3* 10.0  NEUTROABS 8.9*  --   --   HGB 11.8 11.2* 10.6*  HCT 40.7 40.3 38.5  MCV 87 88.2 88.5  PLT 24 Platelet count confirmed by slide estimate* PLATELET CLUMPS NOTED ON SMEAR, COUNT APPEARS DECREASED PLATELET CLUMPS NOTED ON SMEAR, COUNT APPEARS DECREASED   Basic Metabolic Panel:  Recent Labs Lab 11/14/16 1020 11/17/16 1500 11/17/16 2148 11/18/16 0328    NA 140 136  --  139  K 4.3 4.3  --  4.4  CL  --  97*  --  99*  CO2 32* 30  --  32  GLUCOSE 173* 148*  --  122*  BUN 38.9* 42*  --  41*  CREATININE 1.2* 1.38* 1.47* 1.32*  CALCIUM 9.3 8.8*  --  8.6*   GFR: Estimated Creatinine Clearance: 35.4 mL/min (by C-G formula based on SCr of 1.32 mg/dL (H)). Liver Function Tests:  Recent Labs Lab 11/14/16 1020  AST 12  ALT <6  ALKPHOS 74  BILITOT 1.23*  PROT 6.9  ALBUMIN 3.2*   No results for input(s): LIPASE, AMYLASE in the last 168 hours. No results for input(s): AMMONIA in the last 168 hours. Coagulation Profile: No results for input(s): INR, PROTIME in the last 168 hours. Cardiac Enzymes:  Recent Labs Lab 11/17/16 2148 11/18/16 0328  TROPONINI <0.03 <0.03   BNP (last 3 results) No results for input(s): PROBNP in the last 8760 hours. HbA1C: No results for input(s): HGBA1C in the last 72 hours. CBG: No results for input(s): GLUCAP in the last 168 hours. Lipid Profile: No results for input(s): CHOL, HDL, LDLCALC, TRIG, CHOLHDL, LDLDIRECT in the last 72 hours. Thyroid Function Tests: No results for input(s): TSH, T4TOTAL, FREET4, T3FREE, THYROIDAB in the last 72 hours. Anemia Panel: No results for input(s): VITAMINB12, FOLATE, FERRITIN, TIBC, IRON, RETICCTPCT in the last 72 hours. Urine analysis:    Component Value Date/Time   COLORURINE YELLOW 09/12/2016 [place] 09/12/2016 0438   LABSPEC 1.008 09/12/2016 0438   PHURINE 6.0 09/12/2016 0438   GLUCOSEU NEGATIVE 09/12/2016 0438   HGBUR NEGATIVE 09/12/2016 0438   BILIRUBINUR NEGATIVE 09/12/2016 0438   KETONESUR NEGATIVE 09/12/2016 0438   PROTEINUR NEGATIVE 09/12/2016 0438   UROBILINOGEN 0.2 08/10/2013 0037   NITRITE NEGATIVE 09/12/2016 0438   LEUKOCYTESUR TRACE (A) 09/12/2016 0438   Sepsis Labs: _0 (procalcitonin:4,lacticidven:4)   Recent Results (from the past 240 hour(s))  MRSA PCR Screening     Status: Abnormal   Collection Time:  11/17/16 11:31 PM  Result Value Ref Range Status   MRSA by PCR POSITIVE (A) NEGATIVE Final    Comment:        The GeneXpert MRSA Assay (FDA approved for NASAL specimens only), is one component of a comprehensive MRSA colonization surveillance program. It is not intended to diagnose MRSA infection nor to guide or monitor treatment for MRSA infections. RESULT CALLED TO, READ BACK BY AND VERIFIED WITH: DODOO,E RN [id number] 11/18/16 [place]       Radiology  Studies: Dg Chest 2 View  Result Date: 11/17/2016 CLINICAL DATA:  Fluid retention EXAM: CHEST  2 VIEW COMPARISON:  09/13/2016 FINDINGS: Chronic cardiomegaly and vascular pedicle widening. Diffuse interstitial opacity that is similar to multiple priors. There is low volumes with interstitial crowding. No air bronchogram, effusion or pneumothorax. Severe glenohumeral osteoarthritis. Spinal fusion and dorsal column stimulator. IMPRESSION: Cardiomegaly and chronic pulmonary venous congestion. When accounting for chronic interstitial markings, no definitive edema. Electronically Signed   By: Monte Fantasia M.D.   On: 11/17/2016 16:00        Scheduled Meds: . acidophilus  1 capsule Oral Daily  . Chlorhexidine Gluconate Cloth  6 each Topical Q0600  . clopidogrel  75 mg Oral Daily  . digoxin  0.0625 mg Oral Daily  . diltiazem  240 mg Oral Daily  . feeding supplement (PRO-STAT SUGAR FREE 64)  30 mL Oral BID  . furosemide  40 mg Intravenous BID  . gabapentin  600 mg Oral BID  . heparin  5,000 Units Subcutaneous Q8H  . metoprolol  100 mg Oral BID  . morphine  15 mg Oral Q8H  . mupirocin ointment  1 application Nasal BID  . pantoprazole  40 mg Oral Daily  . potassium chloride SA  20 mEq Oral BID  . sodium chloride  1 spray Each Nare TID  . sodium chloride flush  3 mL Intravenous Q12H  . valACYclovir  500 mg Oral Daily   Continuous Infusions:   LOS: 0 days    Time spent: 25 minutes  Greater than 50% of the time spent on counseling  and coordinating the care.   Leisa Lenz, MD [facility] Pager [phone]  If 7PM-7AM, please contact night-coverage [email or web address] Password [email or web address] 11/18/2016, 9:08 AM

## 2016-11-18 NOTE — Progress Notes (Signed)
Echocardiogram 2D Echocardiogram has been performed with definity.  Aggie Cosier 11/18/2016, 3:23 PM

## 2016-11-18 NOTE — Progress Notes (Signed)
Patient VSS. Sats 92 on 6L. Patient tolerating ambulation well. Up to the bathroom overnight x3. No reports of respiratory distress. Rested well. Patient in bed reading.

## 2016-11-19 ENCOUNTER — Other Ambulatory Visit: Payer: Self-pay

## 2016-11-19 DIAGNOSIS — J9600 Acute respiratory failure, unspecified whether with hypoxia or hypercapnia: Secondary | ICD-10-CM

## 2016-11-19 DIAGNOSIS — M546 Pain in thoracic spine: Secondary | ICD-10-CM

## 2016-11-19 LAB — BASIC METABOLIC PANEL
ANION GAP: 5 (ref 5–15)
BUN: 43 mg/dL — ABNORMAL HIGH (ref 6–20)
CHLORIDE: 101 mmol/L (ref 101–111)
CO2: 35 mmol/L — ABNORMAL HIGH (ref 22–32)
Calcium: 8.6 mg/dL — ABNORMAL LOW (ref 8.9–10.3)
Creatinine, Ser: 1.2 mg/dL — ABNORMAL HIGH (ref 0.44–1.00)
GFR calc non Af Amer: 41 mL/min — ABNORMAL LOW (ref >60)
GFR, EST AFRICAN AMERICAN: 47 mL/min — AB (ref >60)
Glucose, Bld: 104 mg/dL — ABNORMAL HIGH (ref 65–99)
POTASSIUM: 4.2 mmol/L (ref 3.5–5.1)
SODIUM: 141 mmol/L (ref 135–145)

## 2016-11-19 MED ORDER — FUROSEMIDE 10 MG/ML IJ SOLN
60.0000 mg | Freq: Two times a day (BID) | INTRAMUSCULAR | Status: DC
  Administered 2016-11-20: 60 mg via INTRAVENOUS
  Filled 2016-11-19 (×2): qty 6

## 2016-11-19 NOTE — Consult Note (Signed)
CARDIOLOGY CONSULT NOTE  Patient ID: [name] [name], MRN: [id number], DOB/AGE: 1931/02/13 83 y.o. Admit date: 11/17/2016 Date of Consult: 11/19/2016  Primary Physician: Mathews Argyle, MD Primary Cardiologist: Dr Percival Spanish Referring Physician: Dr Charlies Silvers  Chief Complaint: Shortness of breath Reason for Consultation: CHF  HPI: This is an 83 year old woman with history of coronary artery disease and diastolic heart failure as well as paroxysmal atrial fibrillation, last seen by cardiology in 2015.  She was admitted 11/17/2016 with shortness of breath and altered mental status. She had increasing oxygen requirements noted from her baseline of 3 L of oxygen per minute. BNP was markedly elevated, troponin was within normal limits, and chronic kidney disease was stable with a creatinine ranging around 1.4 mg/dL. The patient has been treated with IV furosemide. She has been on Plavix alone for antiplatelet therapy. She is not on oral anticoagulation and I suspect this may be related to her comorbid conditions, DO NOT RESUSCITATE status, and fall risk. Initially, the patient was not able to provide any history based on my review of notes, but at the time of my evaluation today the patient is awake and alert.  She is a limited historian. She complains of shortness of breath but thinks this is improved since she's been hospitalized. She denies chest pain or pressure. No heart palpitations or other complaints.  Medical History:  Past Medical History:  Diagnosis Date  . Atrial fibrillation ([place])   . CAD (coronary artery disease)    a. NSTEMI 2/14 => LHC 10/20/12:  dLM 20%, pLAD 99%, mLAD 60% followed by 99%, oD1 99%, and pD2 30%, mild plaque disease in the CFX and RCA. PCI: Xience Xpedition DES to the proximal and mid LAD  . Chronic diastolic CHF (congestive heart failure) ([place])    Echo (08/10/13): Mild LVH, EF 60-65%.  . DJD (degenerative joint disease)   . Dyslipidemia   . HTN (hypertension)     . Osteopenia   . Thrombocytopenia ([place])   . Venous ulcer       Surgical History:  Past Surgical History:  Procedure Laterality Date  . BACK SURGERY     Lumbar disc  . EYE SURGERY     Blepharoplasty  . HIP SURGERY     R THR  . INSERTION / PLACEMENT / REVISION NEUROSTIMULATOR    . JOINT REPLACEMENT    . LEFT HEART CATHETERIZATION WITH CORONARY ANGIOGRAM N/A 10/20/2012   Procedure: LEFT HEART CATHETERIZATION WITH CORONARY ANGIOGRAM;  Surgeon: Burnell Blanks, MD;  Location: [facility] CATH LAB;  Service: Cardiovascular;  Laterality: N/A;     Home Meds: Prior to Admission medications   Medication Sig Start Date End Date Taking? Authorizing Provider  albuterol (PROVENTIL) (2.5 MG/3ML) 0.083% nebulizer solution Take 2.5 mg by nebulization every 6 (six) hours as needed for wheezing or shortness of breath.   Yes Historical Provider, MD  Amino Acids-Protein Hydrolys (FEEDING SUPPLEMENT, PRO-STAT SUGAR FREE 64,) LIQD Take 30 mLs by mouth 2 (two) times daily.   Yes Historical Provider, MD  Calcium Carbonate-Vitamin D (CALCARB 600/D PO) Take 2 tablets by mouth daily.   Yes Historical Provider, MD  clopidogrel (PLAVIX) 75 MG tablet Take 1 tablet (75 mg total) by mouth daily with breakfast. 08/18/13  Yes Velvet Bathe, MD  digoxin 62.5 MCG TABS Take 0.0625 mg by mouth daily. 09/21/16  Yes Jennifer Chahn-Yang Choi, DO  diltiazem (CARDIZEM CD) 240 MG 24 hr capsule Take 1 capsule (240 mg total) by mouth daily. 08/18/13  Yes Velvet Bathe, MD  famciclovir (FAMVIR) 250 MG tablet Take 1 tablet (250 mg total) by mouth daily. 08/03/16  Yes Eliezer Bottom, NP  gabapentin (NEURONTIN) 600 MG tablet Take 1 tablet (600 mg total) by mouth 2 (two) times daily. 09/20/16  Yes Jennifer Chahn-Yang Choi, DO  Lactobacillus Acid-Pectin (ACIDOPHILUS/CITRUS PECTIN) TABS Take 1 tablet by mouth daily.   Yes Historical Provider, MD  metoprolol (LOPRESSOR) 100 MG tablet Take 1 tablet (100 mg total) by mouth 2 (two) times daily.  08/18/13  Yes Velvet Bathe, MD  morphine (KADIAN) 20 MG 24 hr capsule Take 20 mg by mouth 2 (two) times daily.    Yes Historical Provider, MD  oxyCODONE (OXY IR/ROXICODONE) 5 MG immediate release tablet Take 1 tablet (5 mg total) by mouth every 6 (six) hours as needed for breakthrough pain (pain). Patient taking differently: Take 5 mg by mouth 4 (four) times daily.  08/18/13  Yes Velvet Bathe, MD  pantoprazole (PROTONIX) 40 MG tablet Take 1 tablet (40 mg total) by mouth daily. 08/03/16  Yes Eliezer Bottom, NP  Polyethyl Glycol-Propyl Glycol (SYSTANE OP) Place 1 drop into both eyes 2 (two) times daily.   Yes Historical Provider, MD  potassium chloride SA (K-DUR,KLOR-CON) 20 MEQ tablet Take 1 tablet (20 mEq total) by mouth 2 (two) times daily. 08/28/13  Yes Liliane Shi, PA-C  sodium chloride (OCEAN) 0.65 % SOLN nasal spray Place 1 spray into both nostrils 3 (three) times daily.    Yes Historical Provider, MD  torsemide (DEMADEX) 20 MG tablet Take 160 mg by mouth 2 (two) times daily.  11/17/16 11/19/16 Yes Historical Provider, MD  predniSONE (DELTASONE) 10 MG tablet Take 4 tabs for 3 days, then 3 tabs for 3 days, then 2 tabs for 3 days, then 1 tab for 3 days, then 1/2 tab for 4 days. Patient not taking: Reported on 11/17/2016 09/20/16   Shon Millet, DO    Inpatient Medications:  . acidophilus  1 capsule Oral Daily  . Chlorhexidine Gluconate Cloth  6 each Topical Q0600  . clopidogrel  75 mg Oral Daily  . digoxin  0.0625 mg Oral Daily  . diltiazem  240 mg Oral Daily  . feeding supplement (PRO-STAT SUGAR FREE 64)  30 mL Oral BID  . furosemide  40 mg Intravenous BID  . gabapentin  600 mg Oral BID  . heparin  5,000 Units Subcutaneous Q8H  . metoprolol  100 mg Oral BID  . morphine  15 mg Oral Q8H  . mupirocin ointment  1 application Nasal BID  . pantoprazole  40 mg Oral Daily  . polyvinyl alcohol  2 drop Both Eyes BID  . potassium chloride SA  20 mEq Oral BID  . sodium chloride  1 spray  Each Nare TID  . sodium chloride flush  3 mL Intravenous Q12H  . valACYclovir  500 mg Oral Daily     Allergies:  Allergies  Allergen Reactions  . Keflex [Cephalexin] Other (See Comments)    intolerance    Social History   Social History  . Marital status: Divorced    Spouse name: N/A  . Number of children: 2  . Years of education: N/A   Occupational History  .  Retired   Social History Main Topics  . Smoking status: Never Smoker  . Smokeless tobacco: Never Used  . Alcohol use No  . Drug use: No  . Sexual activity: Not on file   Other Topics Concern  .  Not on file   Social History Narrative   Lives at [place].  Two adopted children. Divorced.       Family History  Problem Relation Age of Onset  . CAD Father 68    Vague history      Review of Systems: General: negative for chills, fever, night sweats or weight changes. Positive for fatigue. ENT: negative for rhinorrhea or epistaxis Cardiovascular: see HPI Dermatological: negative for rash Respiratory: negative for cough or wheezing GI: negative for nausea, vomiting, diarrhea, bright red blood per rectum, melena, or hematemesis GU: no hematuria, urgency, or frequency Neurologic: no headache Heme: positive for easy bruising Endo: negative for excessive thirst, thyroid disorder, or flushing Musculoskeletal: negative for joint pain or swelling, negative for myalgias  All other systems reviewed and are otherwise negative except as noted above.  Physical Exam: Blood pressure 119/69, pulse 61, temperature 97.6 F (36.4 C), temperature source Oral, resp. rate 18, height _0  (1.626 m), weight 203 lb 9.6 oz (92.4 kg), SpO2 95 %. Pt is an elderly woman in NAD HEENT: normal Neck: JVP normal. Carotid upstrokes normal without bruits. Lungs: equal expansion, clear bilaterally CV: Apex is discrete and nondisplaced, irregular with 2/6 systolic murmur at the LLSB Abd: soft, NT, +BS, no bruit, no  hepatosplenomegaly Back: no CVA tenderness Ext: Diffuse 2+ edema, stasis ulcerations noted, dressings in place. Skin: warm and dry without rash Neuro: CNII-XII intact             Strength intact = bilaterally    Labs:  Recent Labs  11/17/16 2148 11/18/16 0328 11/18/16 0849  TROPONINI <0.03 <0.03 <0.03   Lab Results  Component Value Date   WBC 10.0 11/17/2016   HGB 10.6 (L) 11/17/2016   HCT 38.5 11/17/2016   MCV 88.5 11/17/2016   PLT  11/17/2016    PLATELET CLUMPS NOTED ON SMEAR, COUNT APPEARS DECREASED    Recent Labs Lab 11/14/16 1020  11/19/16 0446  NA 140  < > 141  K 4.3  < > 4.2  CL  --   < > 101  CO2 32*  < > 35*  BUN 38.9*  < > 43*  CREATININE 1.2*  < > 1.20*  CALCIUM 9.3  < > 8.6*  PROT 6.9  --   --   BILITOT 1.23*  --   --   ALKPHOS 74  --   --   ALT <6  --   --   AST 12  --   --   GLUCOSE 173*  < > 104*  < > = values in this interval not displayed. Lab Results  Component Value Date   CHOL 175 10/17/2012   HDL 53 10/17/2012   LDLCALC 100 (H) 10/17/2012   TRIG 108 10/17/2012   Lab Results  Component Value Date   DDIMER 0.98 (H) 08/10/2013    Radiology/Studies:  Dg Chest 2 View  Result Date: 11/17/2016 CLINICAL DATA:  Fluid retention EXAM: CHEST  2 VIEW COMPARISON:  09/13/2016 FINDINGS: Chronic cardiomegaly and vascular pedicle widening. Diffuse interstitial opacity that is similar to multiple priors. There is low volumes with interstitial crowding. No air bronchogram, effusion or pneumothorax. Severe glenohumeral osteoarthritis. Spinal fusion and dorsal column stimulator. IMPRESSION: Cardiomegaly and chronic pulmonary venous congestion. When accounting for chronic interstitial markings, no definitive edema. Electronically Signed   By: Monte Fantasia M.D.   On: 11/17/2016 16:00    EKG: Atrial fibrillation with age-indeterminate anteroseptal infarct  Cardiac Studies: 2-D echocardiogram 11/18/2016: Study  Conclusions  - Left ventricle: The cavity  size was normal. There was moderate   concentric hypertrophy. Systolic function was vigorous. The   estimated ejection fraction was in the range of 65% to 70%. Wall   motion was normal; there were no regional wall motion   abnormalities. The study was not technically sufficient to allow   evaluation of LV diastolic dysfunction due to atrial   fibrillation. - Ventricular septum: The contour showed diastolic flattening and   systolic flattening. - Aortic valve: There was moderate regurgitation. - Ascending aorta: The ascending aorta was normal in size. - Mitral valve: There was mild regurgitation. - Left atrium: The atrium was moderately dilated. - Right ventricle: Systolic function was normal. - Right atrium: The atrium was moderately dilated. - Tricuspid valve: There was moderate-severe regurgitation. - Pulmonary arteries: Systolic pressure was severely increased. PA   peak pressure: 94 mm Hg (S). - Inferior vena cava: The vessel was dilated. The respirophasic   diameter changes were blunted (< 50%), consistent with elevated   central venous pressure. - Pericardium, extracardiac: There was no pericardial effusion.  Impressions:  - When compared to the prior study from 09/13/2016 LVEF remains the   same 65-70%.   There is moderate aortic regurgitation.   RVSP is now severely elevated at 94 mmHg, previously 46 mmHg.   ASSESSMENT AND PLAN:  1. Acute on chronic respiratory failure, multifactorial 2. Acute on chronic diastolic heart failure with clinical exam evidence of volume overload and elevated BNP 3. Chronic kidney disease stage III with GFR 47, stable creatinine 4. Permanent atrial fibrillation, not on anticoagulation 5. Severe pulmonary hypertension, likely secondary to diastolic heart failure and chronic hypoxemia 6. Thrombocytopenia, chronic and stable, followed by hematology as an outpatient 7. Anemia, chronic without overt bleeding  I think the patient is on  appropriate therapy. I'm not sure there is a lot to offer her that will significantly impact her prognosis. She seems to have very poor functional capacity and multiple comorbid medical conditions. She is making some progress with diuresis but her weight has not changed since admission. Will increase IV Lasix to 60 mg twice daily and follow with you. She does not appear to be a good candidate for chronic anticoagulation.  SignedSherren Mocha MD, [facility] 11/19/2016, 7:12 PM

## 2016-11-19 NOTE — Progress Notes (Signed)
Orthopedic Tech Progress Note Patient Details:  [name] [name] [birth year] [id number]  Ortho Devices Type of Ortho Device: Haematologist Ortho Device/Splint Location: Teacher, English as a foreign language Boots to both left and right Legs.  Patient tolerated application well. Left and Right Legs (Bilateral) Ortho Device/Splint Interventions: Application   Kristopher Oppenheim 11/19/2016, 5:41 PM

## 2016-11-19 NOTE — Progress Notes (Signed)
RN and RT filled up the high flow  container via nasal cannula  with sterile water. Patient resting quietly at this time.

## 2016-11-19 NOTE — Progress Notes (Signed)
Called RT to replace a new high flow container because it's almost empty. I speak to RT and she states that she will bring a new one.O2 94%. Patient resting quietly at this time.

## 2016-11-19 NOTE — Progress Notes (Addendum)
Patient ID: [name] [name], female   DOB: 09-Aug-1931, 83 y.o.   MRN: [id number]  PROGRESS NOTE    YANETH FAIRBAIRN  UVO:[id number] DOB: August 03, 1931 DOA: 11/17/2016  PCP: Mathews Argyle, MD   Brief Narrative:  83 y.o. female with medical history significant  For atrial fibrillation, CAD with DES to proximal and mid LAD, chronic diastolic CHF (ECHO in 74/2595 showed preserved EF), hypertension, DJD. She presented to [facility] ED with shortness of breath. On admission, she was hypoxic but this has improved with [place] oxygen support. Her blood work showed BNP 1492, troponin WNL, creatinine was 1.38, WBC count 11.3. CXR showed cardiomegaly and chronic pulmonary venous congestion. She was started on IV lasix.  Assessment & Plan:   Active Problems: Acute on chronic diastolic CHF / Acute respiratory failure with hypoxia  - Last 2 D ECHO in 08/2016 with preserved EF - BNP on this admission 1492 - Weight since admission 88.5 kg --> 93.5 kg --> 91.4 kg --> 92.4 kg  - Continue IV lasix 40 mg IV BID - Continue metoprolol - Continue digoxin  - Cr remains stable, 1.32 --> 1.20 - Appreciate cardio input     HTN (hypertension), essential - Continue metoprolol and Cardizem   Thrombocytopenia / ITP - No evidence of gross bleed  - Repeat platelets - Not sure why hep subQ not stopped but will stop today     Atrial fibrillation - CHADS vasc score 4 - On anticoagulation with plavix - Rate controlled with metoprolol and Cardizem     Chronic back pain - Continue pain management efforts    CAD (coronary artery disease) of artery bypass graft - Continue plavix    Chronic kidney disease stage 3 - Baseline cr 1.13 in 10/2015 - Cr better this am, 1.20   DVT prophylaxis: subQ heparin stopped due to ?platelet count  Code Status: DNR/DNI  Family Communication: no family at the bedside this am Disposition Plan: needs to be seen by cardio    Consultants:   Cardio   WOC  Procedures:   None    Antimicrobials:   None    Subjective: No overnight events.   Objective: Vitals:   11/18/16 1327 11/18/16 2142 11/19/16 0347 11/19/16 1017  BP:  135/75 (!) 149/90   Pulse:  87 100 85  Resp:  18 18   Temp:  98.2 F (36.8 C) 97.5 F (36.4 C)   TempSrc:  Oral Oral   SpO2: 92% 95% 93%   Weight:   92.4 kg (203 lb 9.6 oz)   Height:        Intake/Output Summary (Last 24 hours) at 11/19/16 1019 Last data filed at 11/19/16 1018  Gross per 24 hour  Intake              240 ml  Output             2000 ml  Net            -1760 ml   Filed Weights   11/17/16 2137 11/18/16 0635 11/19/16 0347  Weight: 93.5 kg (206 lb 1.6 oz) 91.4 kg (201 lb 8 oz) 92.4 kg (203 lb 9.6 oz)    Examination:  General exam: Appears calm and comfortable, no distress  Respiratory system: no wheezing, rales at bases  Cardiovascular system: S1 & S2 heard, Rate controlled  Gastrointestinal system: (+) BS, non tender  Central nervous system: No focal neurological deficits. Extremities: ACE wrapping both LE, venous stasis  Skin: No  rash, warm and dry  Psychiatry: Mood & affect appropriate.   Data Reviewed: I have personally reviewed following labs and imaging studies  CBC:  Recent Labs Lab 11/14/16 1020 11/17/16 1500 11/17/16 2148  WBC 10.6* 11.3* 10.0  NEUTROABS 8.9*  --   --   HGB 11.8 11.2* 10.6*  HCT 40.7 40.3 38.5  MCV 87 88.2 88.5  PLT 24 Platelet count confirmed by slide estimate* PLATELET CLUMPS NOTED ON SMEAR, COUNT APPEARS DECREASED PLATELET CLUMPS NOTED ON SMEAR, COUNT APPEARS DECREASED   Basic Metabolic Panel:  Recent Labs Lab 11/14/16 1020 11/17/16 1500 11/17/16 2148 11/18/16 0328 11/19/16 0446  NA 140 136  --  139 141  K 4.3 4.3  --  4.4 4.2  CL  --  97*  --  99* 101  CO2 32* 30  --  32 35*  GLUCOSE 173* 148*  --  122* 104*  BUN 38.9* 42*  --  41* 43*  CREATININE 1.2* 1.38* 1.47* 1.32* 1.20*  CALCIUM 9.3 8.8*  --  8.6* 8.6*   GFR: Estimated Creatinine Clearance: 39.1  mL/min (by C-G formula based on SCr of 1.2 mg/dL (H)). Liver Function Tests:  Recent Labs Lab 11/14/16 1020  AST 12  ALT <6  ALKPHOS 74  BILITOT 1.23*  PROT 6.9  ALBUMIN 3.2*   No results for input(s): LIPASE, AMYLASE in the last 168 hours. No results for input(s): AMMONIA in the last 168 hours. Coagulation Profile: No results for input(s): INR, PROTIME in the last 168 hours. Cardiac Enzymes:  Recent Labs Lab 11/17/16 2148 11/18/16 0328 11/18/16 0849  TROPONINI <0.03 <0.03 <0.03   BNP (last 3 results) No results for input(s): PROBNP in the last 8760 hours. HbA1C: No results for input(s): HGBA1C in the last 72 hours. CBG: No results for input(s): GLUCAP in the last 168 hours. Lipid Profile: No results for input(s): CHOL, HDL, LDLCALC, TRIG, CHOLHDL, LDLDIRECT in the last 72 hours. Thyroid Function Tests: No results for input(s): TSH, T4TOTAL, FREET4, T3FREE, THYROIDAB in the last 72 hours. Anemia Panel: No results for input(s): VITAMINB12, FOLATE, FERRITIN, TIBC, IRON, RETICCTPCT in the last 72 hours. Urine analysis:    Component Value Date/Time   COLORURINE YELLOW 09/12/2016 [place] 09/12/2016 0438   LABSPEC 1.008 09/12/2016 0438   PHURINE 6.0 09/12/2016 0438   GLUCOSEU NEGATIVE 09/12/2016 0438   HGBUR NEGATIVE 09/12/2016 0438   BILIRUBINUR NEGATIVE 09/12/2016 0438   KETONESUR NEGATIVE 09/12/2016 0438   PROTEINUR NEGATIVE 09/12/2016 0438   UROBILINOGEN 0.2 08/10/2013 0037   NITRITE NEGATIVE 09/12/2016 0438   LEUKOCYTESUR TRACE (A) 09/12/2016 0438   Sepsis Labs: _0 (procalcitonin:4,lacticidven:4)   Recent Results (from the past 240 hour(s))  MRSA PCR Screening     Status: Abnormal   Collection Time: 11/17/16 11:31 PM  Result Value Ref Range Status   MRSA by PCR POSITIVE (A) NEGATIVE Final    Comment:        The GeneXpert MRSA Assay (FDA approved for NASAL specimens only), is one component of a comprehensive MRSA  colonization surveillance program. It is not intended to diagnose MRSA infection nor to guide or monitor treatment for MRSA infections. RESULT CALLED TO, READ BACK BY AND VERIFIED WITH: DODOO,E RN 0222 11/18/16 MITCHELL,L       Radiology Studies: Dg Chest 2 View  Result Date: 11/17/2016 CLINICAL DATA:  Fluid retention EXAM: CHEST  2 VIEW COMPARISON:  09/13/2016 FINDINGS: Chronic cardiomegaly and vascular pedicle widening. Diffuse interstitial opacity that is similar  to multiple priors. There is low volumes with interstitial crowding. No air bronchogram, effusion or pneumothorax. Severe glenohumeral osteoarthritis. Spinal fusion and dorsal column stimulator. IMPRESSION: Cardiomegaly and chronic pulmonary venous congestion. When accounting for chronic interstitial markings, no definitive edema. Electronically Signed   By: Monte Fantasia M.D.   On: 11/17/2016 16:00      Scheduled Meds: . acidophilus  1 capsule Oral Daily  . Chlorhexidine Gluconate Cloth  6 each Topical Q0600  . clopidogrel  75 mg Oral Daily  . digoxin  0.0625 mg Oral Daily  . diltiazem  240 mg Oral Daily  . feeding supplement (PRO-STAT SUGAR FREE 64)  30 mL Oral BID  . furosemide  40 mg Intravenous BID  . gabapentin  600 mg Oral BID  . metoprolol  100 mg Oral BID  . morphine  15 mg Oral Q8H  . mupirocin ointment  1 application Nasal BID  . pantoprazole  40 mg Oral Daily  . polyvinyl alcohol  2 drop Both Eyes BID  . potassium chloride SA  20 mEq Oral BID  . sodium chloride  1 spray Each Nare TID  . sodium chloride flush  3 mL Intravenous Q12H  . valACYclovir  500 mg Oral Daily   Continuous Infusions:   LOS: 1 day    Time spent: 25 minutes  Greater than 50% of the time spent on counseling and coordinating the care.   Leisa Lenz, MD [facility] Pager [phone]  If 7PM-7AM, please contact night-coverage [email or web address] Password [email or web address] 11/19/2016, 10:19 AM

## 2016-11-19 NOTE — Consult Note (Signed)
[facility] Nurse wound consult note Reason for Consult: venous stasis Patient admitted for SOB from SNF. She reports she goes to see Dr. Dellia Nims at the wound care center, she also reports she has her compression wraps (Unna's boots) changed 3x wk.  Unclear if frequency she reports is reliable, most often these would be changed 1x per week or 2 at most. Wound type: Two areas noted after removal of Unna's boots, venous stasis ulcerations Measurement: 1. Right lateral LE: 5cm x 3cm x 0.1cm, open, 50% pink, 50% yellow  2. Left pretibial LE: 0.5cm x 1.0cm x 0.1cm; crusted over with drainage, dry Other scattered areas that appear to recently have healed. Wound bed: see above Drainage (amount, consistency, odor) minimal, no odor Periwound: intact, hemosiderin staining noted Dressing procedure/placement/frequency: Continue calcium alginate to the open ulcerations, cover with soft silicone foam for protection.  Reapply Unna's boots with changes scheduled for M/Thursday.  Bedside nurse to follow up with Orthopedic tech to place Unna's boot once [facility] arrives to the unit.   Discussed POC with patient and bedside nurse.  Re consult if needed, will not follow at this time. Thanks  [employer], RN,CWOCN, CNS [phone])

## 2016-11-20 ENCOUNTER — Other Ambulatory Visit: Payer: Medicare Other

## 2016-11-20 ENCOUNTER — Ambulatory Visit: Payer: Medicare Other

## 2016-11-20 DIAGNOSIS — Z7189 Other specified counseling: Secondary | ICD-10-CM

## 2016-11-20 DIAGNOSIS — Z515 Encounter for palliative care: Secondary | ICD-10-CM

## 2016-11-20 DIAGNOSIS — I5033 Acute on chronic diastolic (congestive) heart failure: Secondary | ICD-10-CM | POA: Diagnosis present

## 2016-11-20 LAB — BASIC METABOLIC PANEL
Anion gap: 9 (ref 5–15)
BUN: 43 mg/dL — ABNORMAL HIGH (ref 6–20)
CALCIUM: 8.7 mg/dL — AB (ref 8.9–10.3)
CO2: 33 mmol/L — ABNORMAL HIGH (ref 22–32)
CREATININE: 1.1 mg/dL — AB (ref 0.44–1.00)
Chloride: 97 mmol/L — ABNORMAL LOW (ref 101–111)
GFR, EST AFRICAN AMERICAN: 52 mL/min — AB (ref >60)
GFR, EST NON AFRICAN AMERICAN: 45 mL/min — AB (ref >60)
Glucose, Bld: 103 mg/dL — ABNORMAL HIGH (ref 65–99)
Potassium: 4.4 mmol/L (ref 3.5–5.1)
SODIUM: 139 mmol/L (ref 135–145)

## 2016-11-20 LAB — CBC
HCT: 37.8 % (ref 36.0–46.0)
Hemoglobin: 10.3 g/dL — ABNORMAL LOW (ref 12.0–15.0)
MCH: 24.2 pg — AB (ref 26.0–34.0)
MCHC: 27.2 g/dL — ABNORMAL LOW (ref 30.0–36.0)
MCV: 88.7 fL (ref 78.0–100.0)
PLATELETS: 13 10*3/uL — AB (ref 150–400)
RBC: 4.26 MIL/uL (ref 3.87–5.11)
RDW: 19 % — ABNORMAL HIGH (ref 11.5–15.5)
WBC: 7.9 10*3/uL (ref 4.0–10.5)

## 2016-11-20 LAB — TYPE AND SCREEN
ABO/RH(D): A POS
Antibody Screen: NEGATIVE

## 2016-11-20 MED ORDER — FUROSEMIDE 10 MG/ML IJ SOLN
80.0000 mg | Freq: Two times a day (BID) | INTRAMUSCULAR | Status: DC
  Administered 2016-11-20 – 2016-11-21 (×2): 80 mg via INTRAVENOUS
  Filled 2016-11-20 (×2): qty 8

## 2016-11-20 NOTE — Consult Note (Signed)
[employer] CM Inpatient Consult   11/20/2016  [name] [name] [birth year] [id number]   Chart reviewed for [place] Management needs as part of the Medicare ACO Registry.  Chart reveals the patient is an 83 y.o. female w/ hx D-CHF, PAF not anticoagulated, DES x 2 LAD 2014 on ASA/Plavix, HTN, HLD, CKD III thrombocytopenia, DNR, was admitted 03/03 with SOB, AMS. HX of HF per MD note.  Also, reviewed notes from Palliative Care for goals of care.  Patient is from [facility] and disposition is not currently known at this time.  Will follow for community needs for follow up, if appropriate.  For questions, please contact:  Natividad Brood, RN BSN [facility] Liaison  [phone] business mobile phone Toll free office [phone]

## 2016-11-20 NOTE — Progress Notes (Signed)
   11/20/16 1415  Clinical Encounter Type  Visited With Patient  Visit Type Other (Comment) ([place] consult)  Spiritual Encounters  Spiritual Needs Emotional  Stress Factors  Patient Stress Factors Health changes  Introduction to Pt. Provided and explained AD to Pt. Pt will discuss with family. Follow up tomorrow.

## 2016-11-20 NOTE — Evaluation (Signed)
Physical Therapy Evaluation Patient Details Name: [name] [name] MRN: [id number] DOB: 1930/12/28 Today's Date: 11/20/2016   History of Present Illness  83 y.o. female with medical history significant  For atrial fibrillation, CAD with DES to proximal and mid LAD, chronic diastolic CHF (ECHO in 75/6433 showed preserved EF), hypertension, DJD. She presented to [facility] ED with shortness of breath. On admission, she was hypoxic but this has improved with [place] oxygen support.   Clinical Impression  PT eval complete. Pt is at her baseline level for functional mobility. She is bed/wheelchair bound at her nursing facility and only completes squat-pivot transfers. She is very rigid in her mindset and unwilling to deviate from her method used for transferring in/out of bed and to/from chair. She dictates how you are allowed to help and what she is willing to do, even refusing use of gait belt during eval. She would not benefit from skilled intervention at this time as she does not demonstrate potential for improvement with mobility or willingness to participate. PT signing off.    Follow Up Recommendations SNF;No PT follow up    Equipment Recommendations  None recommended by PT    Recommendations for Other Services       Precautions / Restrictions Precautions Precautions: Fall Restrictions Weight Bearing Restrictions: No      Mobility  Bed Mobility Overal bed mobility: Needs Assistance Bed Mobility: Supine to Sit;Sit to Supine     Supine to sit: Min assist;HOB elevated Sit to supine: Mod assist;HOB elevated   General bed mobility comments: assist to elevate trunk out of bed, assist with BLE into bed  Transfers Overall transfer level: Needs assistance Equipment used: None Transfers: Squat Pivot Transfers     Squat pivot transfers: Min guard     General transfer comment: min guard for safety, no physical assist  Ambulation/Gait             General Gait Details:  nonambulatory  Stairs            Wheelchair Mobility    Modified Rankin (Stroke Patients Only)       Balance                                             Pertinent Vitals/Pain Pain Assessment: Faces Faces Pain Scale: No hurt Pain Intervention(s): Monitored during session    Home Living Family/patient expects to be discharged to:: Skilled nursing facility                      Prior Function Level of Independence: Needs assistance   Gait / Transfers Assistance Needed: bed and w/c bound at baseline. Pt resides at [facility]. Staff assist with stand-pivot transfers to toilet and w/c.           Hand Dominance   Dominant Hand: Right    Extremity/Trunk Assessment   Upper Extremity Assessment Upper Extremity Assessment: Generalized weakness    Lower Extremity Assessment Lower Extremity Assessment: Generalized weakness    Cervical / Trunk Assessment Cervical / Trunk Assessment: Kyphotic  Communication   Communication: No difficulties  Cognition Arousal/Alertness: Awake/alert Behavior During Therapy: Flat affect Overall Cognitive Status: Within Functional Limits for tasks assessed                      General Comments      Exercises  Assessment/Plan    PT Assessment Patent does not need any further PT services  PT Problem List         PT Treatment Interventions      PT Goals (Current goals can be found in the Care Plan section)  Acute Rehab PT Goals Patient Stated Goal: not stated PT Goal Formulation: All assessment and education complete, DC therapy    Frequency     Barriers to discharge        Co-evaluation               End of Session Equipment Utilized During Treatment: Other (comment);Oxygen (Pt refused gait belt.) Activity Tolerance: Patient tolerated treatment well Patient left: in bed;with call bell/phone within reach Nurse Communication: Mobility status PT Visit Diagnosis:  Unsteadiness on feet (R26.81)         Time: 4621-9471 PT Time Calculation (min) (ACUTE ONLY): 29 min   Charges:   PT Evaluation $PT Eval Moderate Complexity: 1 Procedure PT Treatments $Therapeutic Activity: 8-22 mins   PT G Codes:         Lorriane Shire 11/20/2016, 11:27 AM

## 2016-11-20 NOTE — Consult Note (Signed)
Consultation Note Date: 11/20/2016   Patient Name: [name] [name]  DOB: 01/25/1931  MRN: [id number]  Age / Sex: 83 y.o., female  PCP: Lajean Manes, MD Referring Physician: Robbie Lis, MD  Reason for Consultation: Establishing goals of care  HPI/Patient Profile: 83 y.o. female  with past medical history of dCHF, A. Fib not on anticoagulation, CAD, HTN, ITP, PVD with severe venous insufficiency, CKD stage 3, chronic hypoxemia on 3L O2, and DJD s/p lumbar disk surgery. She was brought to the ED from [facility] for shortness of breath with concern for fluid retention despite increased lasix. She was admitted on 3/3/2018with acute respiratory failure with hypoxia, secondary to CHF exacerbation. Cardiology following, however not a lot to offer to significantly impact her prognosis. Palliative consulted to assist in goals of care.   Clinical Assessment and Goals of Care: I met with Sondos at her bedside. She was fully oriented, but slow to respond and with significant hearing deficits.  Our conversation about her health issues was limited by Emanuelle's lack of insight and I believe low health literacy. She was able to tell me she had "many issues," but she did not want to expand on that and felt "there's no point dwelling on the bad." When I brought up her heart issues she did not understand her heart issues were chronic, the relationship between her heart issues and her symptoms, nor the effect of diet on the condition (very frustrated by salt and fluid restriction). In mentioning ITP she did not know what platelets were (only understanding it had to do with "counts in the blood"), nor did she understand the medication she was taking for the condition, or the goal of management.   In terms of goals of care, she was very ambivalent. She spends her time reading in a bed or chair (she is no longer ambulatory), and is not able to verbalize any activities or people  that bring her joy or happiness. She feels she is "just existing," but also states "it is what it is and I'm okay with that. " She does not feel that she has years left of life, but was unable to tell me why or provide any insight into how she would want to spend her time. Along those lines, I asked if she wanted to spend her time at [facility] focusing on being comfortable, versus continuing to seek out life prolonging measures with going to hematology appointments and transfer to the hospital for acute issues. She was ambivalent on this throughout our conversation and could not give me a clear answer.  Finally, I did address HCPOA. She does want her son as her decision maker in the event she is unable to communicate her wishes. She has a second child who is estranged. I discussed HCPOA paperwork at length with her, but she specifically wanted to fill out the paperwork with a chaplain.  Primary Decision Maker PATIENT   SUMMARY OF RECOMMENDATIONS    DNR, continue current scope of care  I will follow-up with her again tomorrow about GOC after she has had time to think on it  Chaplain consulted to assist in HCPOA paperwork  Code Status/Advance Care Planning:  DNR  Symptom Management:   Aubreyanna denies any burdensome symptoms. She feels her breathing is at baseline, despite obvious increased work of breathing at rest.  Psycho-social/Spiritual:   Desire for further Chaplaincy support:yes  Additional Recommendations: TBD  Prognosis:   Unable to determine  Discharge Planning: I expect back to  [place] with Palliative      Primary Diagnoses: Present on Admission: . Atrial fibrillation ([place]) . Acute respiratory failure with hypoxia ([place]) . HTN (hypertension) . Chronic back pain . CAD (coronary artery disease) of artery bypass graft . Acute on chronic diastolic CHF (congestive heart failure) ([place]) . Chronic ITP (idiopathic thrombocytopenia) ([facility])   I have reviewed the medical  record, interviewed the patient and family, and examined the patient. The following aspects are pertinent.  Past Medical History:  Diagnosis Date  . Atrial fibrillation ([place])   . CAD (coronary artery disease)    a. NSTEMI 2/14 => LHC 10/20/12:  dLM 20%, pLAD 99%, mLAD 60% followed by 99%, oD1 99%, and pD2 30%, mild plaque disease in the CFX and RCA. PCI: Xience Xpedition DES to the proximal and mid LAD  . Chronic diastolic CHF (congestive heart failure) ([place])    Echo (08/10/13): Mild LVH, EF 60-65%.  . DJD (degenerative joint disease)   . Dyslipidemia   . HTN (hypertension)   . Osteopenia   . Thrombocytopenia ([place])   . Venous ulcer    Social History   Social History  . Marital status: Divorced    Spouse name: N/A  . Number of children: 2  . Years of education: N/A   Occupational History  .  Retired   Social History Main Topics  . Smoking status: Never Smoker  . Smokeless tobacco: Never Used  . Alcohol use No  . Drug use: No  . Sexual activity: Not Asked   Other Topics Concern  . None   Social History Narrative   Lives at [facility].  Two adopted children. Divorced.     Family History  Problem Relation Age of Onset  . CAD Father 68    Vague history    Scheduled Meds: . acidophilus  1 capsule Oral Daily  . Chlorhexidine Gluconate Cloth  6 each Topical Q0600  . digoxin  0.0625 mg Oral Daily  . diltiazem  240 mg Oral Daily  . feeding supplement (PRO-STAT SUGAR FREE 64)  30 mL Oral BID  . furosemide  60 mg Intravenous BID  . gabapentin  600 mg Oral BID  . metoprolol  100 mg Oral BID  . morphine  15 mg Oral Q8H  . mupirocin ointment  1 application Nasal BID  . pantoprazole  40 mg Oral Daily  . polyvinyl alcohol  2 drop Both Eyes BID  . potassium chloride SA  20 mEq Oral BID  . sodium chloride  1 spray Each Nare TID  . sodium chloride flush  3 mL Intravenous Q12H  . valACYclovir  500 mg Oral Daily   Continuous Infusions: PRN Meds:.sodium chloride, acetaminophen,  albuterol, ondansetron (ZOFRAN) IV, oxyCODONE, sodium chloride flush Allergies  Allergen Reactions  . Keflex [Cephalexin] Other (See Comments)    intolerance   Review of Systems  Constitutional: Positive for fatigue. Negative for activity change, appetite change and unexpected weight change.  HENT: Positive for hearing loss. Negative for congestion, sinus pressure, sore throat and trouble swallowing.   Eyes: Negative for visual disturbance.  Respiratory: Positive for shortness of breath (feels she is at her baseline) and wheezing. Negative for chest tightness.   Cardiovascular: Positive for leg swelling ("it's always there"). Negative for chest pain.  Gastrointestinal: Negative for abdominal distention, abdominal pain and nausea.  Musculoskeletal: Positive for back pain (chronic) and gait problem (non-ambulatory).  Skin: Positive for pallor.  Neurological: Positive for weakness. Negative for dizziness and speech difficulty.  Hematological: Bruises/bleeds easily.  Psychiatric/Behavioral: Negative for confusion and sleep disturbance. The patient is not nervous/anxious.    Physical Exam  Constitutional: She is oriented to person, place, and time. She appears well-developed and well-nourished.  HENT:  Head: Normocephalic and atraumatic.  Mouth/Throat: Oropharynx is clear and moist. No oropharyngeal exudate.  Eyes: EOM are normal.  Neck: Normal range of motion.  Cardiovascular: An irregularly irregular rhythm present.  Pulmonary/Chest:  Increased WOB at rest. Mild wheezing RUL/LUL.  Abdominal: Soft. Bowel sounds are normal.  Musculoskeletal:  BLE wrapped, did not assess per pt request  Neurological: She is alert and oriented to person, place, and time.  Slow to respond  Skin: Skin is warm and dry. Bruising noted. There is pallor.  Psychiatric: Judgment and thought content normal. Her speech is delayed. She is slowed and withdrawn. Cognition and memory are normal.  Very flat affect.  Poor medical insight.   Vital Signs: BP 130/84 (BP Location: Right Arm)   Pulse 98   Temp 97.7 F (36.5 C) (Oral)   Resp 18   Ht _0  (1.626 m)   Wt 92.5 kg (204 lb) Comment: Scale B  SpO2 95%   BMI 35.02 kg/m  Pain Assessment: No/denies pain   Pain Score: 3    SpO2: SpO2: 95 % O2 Device:SpO2: 95 % O2 Flow Rate: .O2 Flow Rate (L/min): 6 L/min  IO: Intake/output summary:  Intake/Output Summary (Last 24 hours) at 11/20/16 1009 Last data filed at 11/20/16 9242  Gross per 24 hour  Intake              806 ml  Output             2000 ml  Net            -1194 ml    LBM: Last BM Date: 11/18/16 Baseline Weight: Weight: 88.5 kg (195 lb) Most recent weight: Weight: 92.5 kg (204 lb) (Scale B)     Palliative Assessment/Data: PPS 40%   Flowsheet Rows   Flowsheet Row Most Recent Value  Intake Tab  Referral Department  Hospitalist  Unit at Time of Referral  Cardiac/Telemetry Unit  Palliative Care Primary Diagnosis  Cardiac  Date Notified  11/20/16  Palliative Care Type  New Palliative care  Reason for referral  Clarify Goals of Care  Date of Admission  11/17/16  # of days IP prior to Palliative referral  3  Clinical Assessment  Psychosocial & Spiritual Assessment  Palliative Care Outcomes     Time Total: 70 minutes Greater than 50%  of this time was spent counseling and coordinating care related to the above assessment and plan.  Signed by: Charlynn Court, NP Palliative Medicine Team Pager # [phone] (M-F 7a-5p) Team Phone # [phone] (Nights/Weekends)

## 2016-11-20 NOTE — Progress Notes (Signed)
Patient ID: [name] [name], female   DOB: 1931-08-30, 83 y.o.   MRN: [id number]  PROGRESS NOTE    [name] [name]  JSH:[id number] DOB: 05-02-1931 DOA: 11/17/2016  PCP: Mathews Argyle, MD   Brief Narrative:  83 y.o. female with medical history significant  For atrial fibrillation, CAD with DES to proximal and mid LAD, chronic diastolic CHF (ECHO in 85/0277 showed preserved EF), hypertension, DJD. She presented to [facility] ED with shortness of breath. On admission, she was hypoxic but this has improved with [place] oxygen support. Her blood work showed BNP 1492, troponin WNL, creatinine was 1.38, WBC count 11.3. CXR showed cardiomegaly and chronic pulmonary venous congestion. She was started on IV lasix.  Assessment & Plan:   Active Problems: Acute on chronic diastolic CHF / Acute respiratory failure with hypoxia  - Last 2 D ECHO in 08/2016 with preserved EF - BNP on this admission 1492 - Weight since admission 88.5 kg --> 93.5 kg --> 91.4 kg --> 92.4 kg --> 92.5 kg  - Seen by cardio in consultation: not sure if there is a lot to ffer that will significantly impact her prognosis. They increased lasix from 40 mg IV BID to 60 mg IV BID - Continue metoprolol and cardizem  - Continue digoxin  - Cr remains stable, 1.32 --> 1.20 --> 1.10 - Appreciate palliative care assistance with goals of care     HTN (hypertension), essential - Continue metoprolol and Cardizem   Thrombocytopenia / ITP - Pt is on weekly Romiplostin injection  - No evidence of gross bleed  - Stopped heparin subQ 11/19/2016 - Will stop plavix this am - Appreciate Dr. Marin Olp seeing the pt in consultation     Atrial fibrillation, chronic  - CHADS vasc score 4 - On anticoagulation with plavix but due to dropping platelet count will hold plavix starting today  - Rate controlled with metoprolol and Cardizem     Chronic back pain - Continue pain management efforts with morphine 100 mg PO BID    CAD (coronary artery disease) of  artery bypass graft - Stop plavix due to drop in platelet count     Chronic kidney disease stage 3 - Baseline cr 1.13 in 10/2015 - Cr within baseline range this am    Anemia of chronic kidney disease - Hemoglobin 10.3, stable     Venous stasis ulceration - Seen by WOC, appreciate their assessment - Right lateral LE: 5cm x 3cm x 0.1cm, open, 50% pink, 50% yellow  - Left pretibial LE: 0.5cm x 1.0cm x 0.1cm; crusted over with drainage, dry - Dressing procedure/placement/frequency: Continue calcium alginate to the open ulcerations, cover with soft silicone foam for protection.  Reapply Unna's boots with changes scheduled for M/Thursday.     DVT prophylaxis: subQ heparin stopped due to ?platelet count  Code Status: DNR/DNI  Family Communication: no family at the bedside this am Disposition Plan: platelets low this am at 13, consulted PCT and oncology; pt not yet stable for discharge    Consultants:   Cardio   Oncology, Dr. Marin Olp  Palliative care team   WOC  Procedures:   None   Antimicrobials:   None    Subjective: No overnight events.   Objective: Vitals:   11/19/16 2007 11/19/16 2032 11/19/16 2144 11/20/16 0647  BP:   139/75 130/84  Pulse: 88  72 98  Resp:   18 18  Temp:   97.6 F (36.4 C) 97.7 F (36.5 C)  TempSrc:   Oral  Oral  SpO2: 98% 94% 91% 95%  Weight:    92.5 kg (204 lb)  Height:        Intake/Output Summary (Last 24 hours) at 11/20/16 0915 Last data filed at 11/20/16 6967  Gross per 24 hour  Intake              806 ml  Output             2000 ml  Net            -1194 ml   Filed Weights   11/18/16 0635 11/19/16 0347 11/20/16 0647  Weight: 91.4 kg (201 lb 8 oz) 92.4 kg (203 lb 9.6 oz) 92.5 kg (204 lb)    Examination:  General exam: no distress  Respiratory system: no wheezing, rales at bases  Cardiovascular system: S1 & S2 heard, Rate controlled  Gastrointestinal system: (+) BS, no distention  Central nervous system: Nonocal    Extremities: ACE wrapping both LE, venous stasis  Skin: warm and dry  Psychiatry: Mood & affect appropriate. No agitation or restlessness   Data Reviewed: I have personally reviewed following labs and imaging studies  CBC:  Recent Labs Lab 11/14/16 1020 11/17/16 1500 11/17/16 2148 11/20/16 0429  WBC 10.6* 11.3* 10.0 7.9  NEUTROABS 8.9*  --   --   --   HGB 11.8 11.2* 10.6* 10.3*  HCT 40.7 40.3 38.5 37.8  MCV 87 88.2 88.5 88.7  PLT 24 Platelet count confirmed by slide estimate* PLATELET CLUMPS NOTED ON SMEAR, COUNT APPEARS DECREASED PLATELET CLUMPS NOTED ON SMEAR, COUNT APPEARS DECREASED 13*   Basic Metabolic Panel:  Recent Labs Lab 11/14/16 1020 11/17/16 1500 11/17/16 2148 11/18/16 0328 11/19/16 0446 11/20/16 0429  NA 140 136  --  139 141 139  K 4.3 4.3  --  4.4 4.2 4.4  CL  --  97*  --  99* 101 97*  CO2 32* 30  --  32 35* 33*  GLUCOSE 173* 148*  --  122* 104* 103*  BUN 38.9* 42*  --  41* 43* 43*  CREATININE 1.2* 1.38* 1.47* 1.32* 1.20* 1.10*  CALCIUM 9.3 8.8*  --  8.6* 8.6* 8.7*   GFR: Estimated Creatinine Clearance: 42.7 mL/min (by C-G formula based on SCr of 1.1 mg/dL (H)). Liver Function Tests:  Recent Labs Lab 11/14/16 1020  AST 12  ALT <6  ALKPHOS 74  BILITOT 1.23*  PROT 6.9  ALBUMIN 3.2*   No results for input(s): LIPASE, AMYLASE in the last 168 hours. No results for input(s): AMMONIA in the last 168 hours. Coagulation Profile: No results for input(s): INR, PROTIME in the last 168 hours. Cardiac Enzymes:  Recent Labs Lab 11/17/16 2148 11/18/16 0328 11/18/16 0849  TROPONINI <0.03 <0.03 <0.03   BNP (last 3 results) No results for input(s): PROBNP in the last 8760 hours. HbA1C: No results for input(s): HGBA1C in the last 72 hours. CBG: No results for input(s): GLUCAP in the last 168 hours. Lipid Profile: No results for input(s): CHOL, HDL, LDLCALC, TRIG, CHOLHDL, LDLDIRECT in the last 72 hours. Thyroid Function Tests: No results for  input(s): TSH, T4TOTAL, FREET4, T3FREE, THYROIDAB in the last 72 hours. Anemia Panel: No results for input(s): VITAMINB12, FOLATE, FERRITIN, TIBC, IRON, RETICCTPCT in the last 72 hours. Urine analysis:    Component Value Date/Time   COLORURINE YELLOW 09/12/2016 [place] 09/12/2016 0438   LABSPEC 1.008 09/12/2016 0438   PHURINE 6.0 09/12/2016 0438   GLUCOSEU NEGATIVE 09/12/2016  [place] 09/12/2016 [place] 09/12/2016 [place] 09/12/2016 0438   PROTEINUR NEGATIVE 09/12/2016 0438   UROBILINOGEN 0.2 08/10/2013 0037   NITRITE NEGATIVE 09/12/2016 0438   LEUKOCYTESUR TRACE (A) 09/12/2016 0438   Sepsis Labs: _0 (procalcitonin:4,lacticidven:4)   Recent Results (from the past 240 hour(s))  MRSA PCR Screening     Status: Abnormal   Collection Time: 11/17/16 11:31 PM  Result Value Ref Range Status   MRSA by PCR POSITIVE (A) NEGATIVE Final    Comment:        The GeneXpert MRSA Assay (FDA approved for NASAL specimens only), is one component of a comprehensive MRSA colonization surveillance program. It is not intended to diagnose MRSA infection nor to guide or monitor treatment for MRSA infections. RESULT CALLED TO, READ BACK BY AND VERIFIED WITH: DODOO,E RN 0222 11/18/16 MITCHELL,L       Radiology Studies: Dg Chest 2 View  Result Date: 11/17/2016 CLINICAL DATA:  Fluid retention EXAM: CHEST  2 VIEW COMPARISON:  09/13/2016 FINDINGS: Chronic cardiomegaly and vascular pedicle widening. Diffuse interstitial opacity that is similar to multiple priors. There is low volumes with interstitial crowding. No air bronchogram, effusion or pneumothorax. Severe glenohumeral osteoarthritis. Spinal fusion and dorsal column stimulator. IMPRESSION: Cardiomegaly and chronic pulmonary venous congestion. When accounting for chronic interstitial markings, no definitive edema. Electronically Signed   By: Monte Fantasia M.D.   On:  11/17/2016 16:00      Scheduled Meds: . acidophilus  1 capsule Oral Daily  . Chlorhexidine Gluconate Cloth  6 each Topical Q0600  . clopidogrel  75 mg Oral Daily  . digoxin  0.0625 mg Oral Daily  . diltiazem  240 mg Oral Daily  . feeding supplement (PRO-STAT SUGAR FREE 64)  30 mL Oral BID  . furosemide  40 mg Intravenous BID  . gabapentin  600 mg Oral BID  . metoprolol  100 mg Oral BID  . morphine  15 mg Oral Q8H  . mupirocin ointment  1 application Nasal BID  . pantoprazole  40 mg Oral Daily  . polyvinyl alcohol  2 drop Both Eyes BID  . potassium chloride SA  20 mEq Oral BID  . sodium chloride  1 spray Each Nare TID  . sodium chloride flush  3 mL Intravenous Q12H  . valACYclovir  500 mg Oral Daily   Continuous Infusions:   LOS: 2 days    Time spent: 25 minutes  Greater than 50% of the time spent on counseling and coordinating the care.   Leisa Lenz, MD [facility] Pager [phone]  If 7PM-7AM, please contact night-coverage [email or web address] Password [email or web address] 11/20/2016, 9:15 AM

## 2016-11-20 NOTE — Progress Notes (Signed)
Progress Note  Patient Name: [name] [name] Date of Encounter: 11/20/2016  Primary Cardiologist: Dr Percival Spanish 2015  Patient Profile     83 y.o. female w/ hx D-CHF, PAF not anticoagulated, DES x 2 LAD 2014 on ASA/Plavix, HTN, HLD, CKD III thrombocytopenia, DNR, was admitted 03/03 with SOB, AMS. Cards saw 03/05 for CHF  Subjective   Feels that she is breathing ok, but admits she breathes faster w/ exertion. Has not been on O2 before. Poor comprehension of her health in general.  Inpatient Medications    Scheduled Meds: . acidophilus  1 capsule Oral Daily  . Chlorhexidine Gluconate Cloth  6 each Topical Q0600  . clopidogrel  75 mg Oral Daily  . digoxin  0.0625 mg Oral Daily  . diltiazem  240 mg Oral Daily  . feeding supplement (PRO-STAT SUGAR FREE 64)  30 mL Oral BID  . furosemide  60 mg Intravenous BID  . gabapentin  600 mg Oral BID  . metoprolol  100 mg Oral BID  . morphine  15 mg Oral Q8H  . mupirocin ointment  1 application Nasal BID  . pantoprazole  40 mg Oral Daily  . polyvinyl alcohol  2 drop Both Eyes BID  . potassium chloride SA  20 mEq Oral BID  . sodium chloride  1 spray Each Nare TID  . sodium chloride flush  3 mL Intravenous Q12H  . valACYclovir  500 mg Oral Daily   Continuous Infusions:  PRN Meds: sodium chloride, acetaminophen, albuterol, ondansetron (ZOFRAN) IV, oxyCODONE, sodium chloride flush   Vital Signs    Vitals:   11/19/16 2007 11/19/16 2032 11/19/16 2144 11/20/16 0647  BP:   139/75 130/84  Pulse: 88  72 98  Resp:   18 18  Temp:   97.6 F (36.4 C) 97.7 F (36.5 C)  TempSrc:   Oral Oral  SpO2: 98% 94% 91% 95%  Weight:    204 lb (92.5 kg)  Height:        Intake/Output Summary (Last 24 hours) at 11/20/16 0916 Last data filed at 11/20/16 1749  Gross per 24 hour  Intake              806 ml  Output             2000 ml  Net            -1194 ml   Filed Weights   11/18/16 0635 11/19/16 0347 11/20/16 0647  Weight: 201 lb 8 oz (91.4 kg)  203 lb 9.6 oz (92.4 kg) 204 lb (92.5 kg)    Telemetry    Afib, rate generally ok, rare PVCs - Personally Reviewed  ECG    n/a - Personally Reviewed  Physical Exam   General: Well developed, well nourished, female appearing in no acute distress. Head: Normocephalic, atraumatic.  Neck: Supple without bruits, JVD 9 cm. Lungs:  Resp regular and unlabored, rales bases Heart: Irreg R&R, S1, S2, no S3, S4, 2/6 murmur; no rub. Abdomen: Soft, non-tender, non-distended with normoactive bowel sounds. No hepatomegaly. No rebound/guarding. No obvious abdominal masses. Extremities: No clubbing, cyanosis, trace pedal edema. Distal pedal pulses are 2+ bilaterally. Neuro: Alert and oriented X 3. Moves all extremities spontaneously. Psych: Normal affect.  Labs    Hematology Recent Labs Lab 11/17/16 1500 11/17/16 2148 11/20/16 0429  WBC 11.3* 10.0 7.9  RBC 4.57 4.35 4.26  HGB 11.2* 10.6* 10.3*  HCT 40.3 38.5 37.8  MCV 88.2 88.5 88.7  MCH 24.5* 24.4*  24.2*  MCHC 27.8* 27.5* 27.2*  RDW 19.1* 18.8* 19.0*  PLT PLATELET CLUMPS NOTED ON SMEAR, COUNT APPEARS DECREASED PLATELET CLUMPS NOTED ON SMEAR, COUNT APPEARS DECREASED 13*    Chemistry Recent Labs Lab 11/14/16 1020  11/18/16 0328 11/19/16 0446 11/20/16 0429  NA 140  < > 139 141 139  K 4.3  < > 4.4 4.2 4.4  CL  --   < > 99* 101 97*  CO2 32*  < > 32 35* 33*  GLUCOSE 173*  < > 122* 104* 103*  BUN 38.9*  < > 41* 43* 43*  CREATININE 1.2*  < > 1.32* 1.20* 1.10*  CALCIUM 9.3  < > 8.6* 8.6* 8.7*  PROT 6.9  --   --   --   --   ALBUMIN 3.2*  --   --   --   --   AST 12  --   --   --   --   ALT <6  --   --   --   --   ALKPHOS 74  --   --   --   --   BILITOT 1.23*  --   --   --   --   GFRNONAA  --   < > 36* 41* 45*  GFRAA  --   < > 42* 47* 52*  ANIONGAP 10  < > _0 < > = values in this interval not displayed.   Cardiac Enzymes Recent Labs Lab 11/17/16 2148 11/18/16 0328 11/18/16 0849  TROPONINI <0.03 <0.03 <0.03    Recent  Labs Lab 11/17/16 1509  TROPIPOC 0.00     BNP Recent Labs Lab 11/17/16 1500  BNP 1,492.9*     Radiology    Dg Chest 2 View  Result Date: 11/17/2016 CLINICAL DATA:  Fluid retention EXAM: CHEST  2 VIEW COMPARISON:  09/13/2016 FINDINGS: Chronic cardiomegaly and vascular pedicle widening. Diffuse interstitial opacity that is similar to multiple priors. There is low volumes with interstitial crowding. No air bronchogram, effusion or pneumothorax. Severe glenohumeral osteoarthritis. Spinal fusion and dorsal column stimulator. IMPRESSION: Cardiomegaly and chronic pulmonary venous congestion. When accounting for chronic interstitial markings, no definitive edema. Electronically Signed   By: Monte Fantasia M.D.   On: 11/17/2016 16:00     Cardiac Studies   2-D echocardiogram 11/18/2016: Study Conclusions - Left ventricle: The cavity size was normal. There was moderate concentric hypertrophy. Systolic function was vigorous. The estimated ejection fraction was in the range of 65% to 70%. Wall motion was normal; there were no regional wall motion abnormalities. The study was not technically sufficient to allow evaluation of LV diastolic dysfunction due to atrialfibrillation. - Ventricular septum: The contour showed diastolic flattening and systolic flattening. - Aortic valve: There was moderate regurgitation. - Ascending aorta: The ascending aorta was normal in size. - Mitral valve: There was mild regurgitation. - Left atrium: The atrium was moderately dilated. - Right ventricle: Systolic function was normal. - Right atrium: The atrium was moderately dilated. - Tricuspid valve: There was moderate-severe regurgitation. - Pulmonary arteries: Systolic pressure was severely increased. PA peak pressure: 94 mm Hg (S). - Inferior vena cava: The vessel was dilated. The respirophasic diameter changes were blunted (<50%), consistent with elevated central venous pressure. -  Pericardium, extracardiac: There was no pericardial effusion. Impressions: - When compared to the prior study from 09/13/2016 LVEF remains the same 65-70%. There is moderate aortic regurgitation. RVSP is now severely elevated at 94 mmHg, previously 46  mmHg.   CATH: n/a   Patient Profile     83 y.o. female w/ hx  D-CHF, PAF not anticoagulated, DES x 2 LAD 2014 on ASA/Plavix, HTN, HLD, CKD III thrombocytopenia, DNR, was admitted 03/03 with SOB, AMS. Cards saw 03/05 for CHF  Assessment & Plan    1. Acute on chronic respiratory failure, multifactorial 2. Acute on chronic diastolic heart failure with clinical exam evidence of volume overload and elevated BNP - Lasix increased 40 mg bid>>60 mg bid 03/05 - wt still no change, I/O net neg 2.8 L since admit - will go up to 80 mg IV bid, should only take a day or 2.  - wt last month was in the 190s, otherwise all weights are over 200 - think dry weight is right at 200.  3. Chronic kidney disease stage III with GFR 47, stable creatinine - continue to follow daily while diuresing - GFR was 32 on admit, trending up, was 45 on 10/2016  4. Permanent atrial fibrillation, not on anticoagulation -rate generally ok  5. Severe pulmonary hypertension, likely secondary to diastolic heart failure and chronic hypoxemia - continue diuresis  Otherwise, per IM 6. Thrombocytopenia, chronic and stable, followed by hematology as an outpatient 7. Anemia, chronic without overt bleeding  Active Problems:   HTN (hypertension)   Dyslipidemia   Chronic back pain   CAD (coronary artery disease) of artery bypass graft   Atrial fibrillation (HCC)   Acute respiratory failure with hypoxia (HCC)   Acute CHF (congestive heart failure) ([place])  Signed, Rosaria Ferries , PA-C 9:16 AM 11/20/2016 Pager: [phone]  Patient seen, examined. Available data reviewed. Agree with findings, assessment, and plan as outlined by Rosaria Ferries, PA-C. The  patient is elderly, alert, in NAD. Lungs CTA< heart RRR, abdomen soft, NT, ext: with Una boots in place.   Chart reviewed. Palliative care consult today. Pt is discouraged. She feels like there is not much being done for her and she wants to go back to her facility as soon as possible. I explained the medication management, IV diuretics, etc, that is part of her treatment. Will continue IV diuretics today but I think it would be ok to switch back to an oral regimen with a goal of hospital discharge in the next 24 hours if no other medical issues.   Sherren Mocha, M.D. 11/20/2016 6:30 PM

## 2016-11-21 DIAGNOSIS — L97909 Non-pressure chronic ulcer of unspecified part of unspecified lower leg with unspecified severity: Secondary | ICD-10-CM | POA: Diagnosis present

## 2016-11-21 DIAGNOSIS — I872 Venous insufficiency (chronic) (peripheral): Secondary | ICD-10-CM | POA: Diagnosis present

## 2016-11-21 DIAGNOSIS — R0902 Hypoxemia: Secondary | ICD-10-CM

## 2016-11-21 DIAGNOSIS — E669 Obesity, unspecified: Secondary | ICD-10-CM

## 2016-11-21 DIAGNOSIS — D696 Thrombocytopenia, unspecified: Secondary | ICD-10-CM

## 2016-11-21 DIAGNOSIS — L97309 Non-pressure chronic ulcer of unspecified ankle with unspecified severity: Secondary | ICD-10-CM

## 2016-11-21 DIAGNOSIS — I83003 Varicose veins of unspecified lower extremity with ulcer of ankle: Secondary | ICD-10-CM

## 2016-11-21 DIAGNOSIS — J9621 Acute and chronic respiratory failure with hypoxia: Secondary | ICD-10-CM

## 2016-11-21 DIAGNOSIS — N183 Chronic kidney disease, stage 3 unspecified: Secondary | ICD-10-CM | POA: Diagnosis present

## 2016-11-21 DIAGNOSIS — I4891 Unspecified atrial fibrillation: Secondary | ICD-10-CM

## 2016-11-21 DIAGNOSIS — Z22322 Carrier or suspected carrier of Methicillin resistant Staphylococcus aureus: Secondary | ICD-10-CM

## 2016-11-21 DIAGNOSIS — I10 Essential (primary) hypertension: Secondary | ICD-10-CM

## 2016-11-21 DIAGNOSIS — J9620 Acute and chronic respiratory failure, unspecified whether with hypoxia or hypercapnia: Secondary | ICD-10-CM

## 2016-11-21 DIAGNOSIS — D693 Immune thrombocytopenic purpura: Secondary | ICD-10-CM

## 2016-11-21 DIAGNOSIS — I509 Heart failure, unspecified: Secondary | ICD-10-CM

## 2016-11-21 DIAGNOSIS — I482 Chronic atrial fibrillation: Secondary | ICD-10-CM

## 2016-11-21 DIAGNOSIS — D649 Anemia, unspecified: Secondary | ICD-10-CM

## 2016-11-21 DIAGNOSIS — I5033 Acute on chronic diastolic (congestive) heart failure: Secondary | ICD-10-CM

## 2016-11-21 DIAGNOSIS — I83009 Varicose veins of unspecified lower extremity with ulcer of unspecified site: Secondary | ICD-10-CM | POA: Diagnosis present

## 2016-11-21 LAB — BASIC METABOLIC PANEL
Anion gap: 7 (ref 5–15)
BUN: 45 mg/dL — AB (ref 6–20)
CO2: 36 mmol/L — ABNORMAL HIGH (ref 22–32)
Calcium: 8.8 mg/dL — ABNORMAL LOW (ref 8.9–10.3)
Chloride: 99 mmol/L — ABNORMAL LOW (ref 101–111)
Creatinine, Ser: 1.09 mg/dL — ABNORMAL HIGH (ref 0.44–1.00)
GFR, EST AFRICAN AMERICAN: 53 mL/min — AB (ref >60)
GFR, EST NON AFRICAN AMERICAN: 46 mL/min — AB (ref >60)
Glucose, Bld: 96 mg/dL (ref 65–99)
POTASSIUM: 4.1 mmol/L (ref 3.5–5.1)
SODIUM: 142 mmol/L (ref 135–145)

## 2016-11-21 LAB — CBC WITH DIFFERENTIAL/PLATELET

## 2016-11-21 LAB — DIFFERENTIAL
BAND NEUTROPHILS: 0 %
BASOS PCT: 0 %
BLASTS: 0 %
Basophils Absolute: 0 10*3/uL (ref 0.0–0.1)
EOS PCT: 2 %
Eosinophils Absolute: 0.1 10*3/uL (ref 0.0–0.7)
LYMPHS PCT: 21 %
Lymphs Abs: 1.4 10*3/uL (ref 0.7–4.0)
Metamyelocytes Relative: 1 %
Monocytes Absolute: 0.3 10*3/uL (ref 0.1–1.0)
Monocytes Relative: 4 %
Myelocytes: 0 %
NEUTROS ABS: 5.1 10*3/uL (ref 1.7–7.7)
NEUTROS PCT: 72 %
OTHER: 0 %
Promyelocytes Absolute: 0 %
nRBC: 0 /100 WBC

## 2016-11-21 LAB — CBC
HEMATOCRIT: 37.2 % (ref 36.0–46.0)
Hemoglobin: 10 g/dL — ABNORMAL LOW (ref 12.0–15.0)
MCH: 23.8 pg — ABNORMAL LOW (ref 26.0–34.0)
MCHC: 26.9 g/dL — ABNORMAL LOW (ref 30.0–36.0)
MCV: 88.6 fL (ref 78.0–100.0)
PLATELETS: 20 10*3/uL — AB (ref 150–400)
RBC: 4.2 MIL/uL (ref 3.87–5.11)
RDW: 19.2 % — AB (ref 11.5–15.5)
WBC: 6.9 10*3/uL (ref 4.0–10.5)

## 2016-11-21 LAB — PROTIME-INR
INR: 1.22
Prothrombin Time: 15.4 seconds — ABNORMAL HIGH (ref 11.4–15.2)

## 2016-11-21 MED ORDER — TORSEMIDE 20 MG PO TABS
120.0000 mg | ORAL_TABLET | Freq: Two times a day (BID) | ORAL | Status: DC
  Administered 2016-11-22: 120 mg via ORAL
  Filled 2016-11-21: qty 6

## 2016-11-21 NOTE — Progress Notes (Signed)
11/21/16 1000  Clinical Encounter Type  Visited With Patient  Visit Type Follow-up  Spiritual Encounters  Spiritual Needs Emotional  Stress Factors  Patient Stress Factors Health changes  Checked with Pt on AD decision. Pt undecided. Pt to discuss with son.

## 2016-11-21 NOTE — Progress Notes (Signed)
Progress Note    [name] [name]  LMR:[id number] DOB: August 10, 1931  DOA: 11/17/2016 PCP: Mathews Argyle, MD    Brief Narrative:   Chief complaint: Follow-up CHF  [name] [name] is an 83 y.o. female with a PMH of chronic respiratory failure on 3 L of home oxygen, atrial fibrillation (not on blood thinners secondary to chronic thrombocytopenia), chronic diastolic CHF (EF 35-78 percent on Echo done 12/17), chronic venous stasis of the lower extremities, CAD, hypertension and chronic back pain who was admitted 11/17/16 for evaluation of shortness of breath. BNP was 1492 on admission with chest x-ray showing cardiomegaly and chronic pulmonary venous congestion.  Assessment/Plan:   Principal Problem:   Acute on chronic respiratory failure with hypoxia secondary to Acute on chronic diastolic CHF (congestive heart failure) ([place]) 2-D echo repeated which continues to show a normal EF. Troponins negative. Cardiology consulted. Continue metoprolol, Cardizem, digoxin, Demadex and Lasix. I/O balance and weight appears to be stable. Seen by palliative care team on 11/20/16 with goals of care established. Continue current scope of treatment. Patient is a DO NOT RESUSCITATE. Does not appear to be diuresing despite increased dose Lasix and Demadex.  Active Problems:   HTN (hypertension) Continue Cardizem and metoprolol.    Chronic back pain Continue MS Contin and Neurontin. Continue oxycodone as needed for breakthrough pain.     CAD (coronary artery disease) of artery bypass graft Continue beta blocker.    Atrial fibrillation ([place]) Rate controlled. Continue Cardizem, digoxin and metoprolol. Not a candidate for blood thinners due to ITP and chronic thrombocytopenia.    Chronic ITP (idiopathic thrombocytopenia) (HCC) Evaluated by Dr. Marin Olp who mentioned performing a bone marrow biopsy for definitive diagnosis, which is scheduled for tomorrow by IR. Transfuse for platelets less than 10.   Goals of care, counseling/discussion/Palliative care by specialist Being followed by the palliative care team. Full scope of treatment but DO NOT RESUSCITATE.    Stage III chronic kidney disease Creatinine stable and trending down with diuresis.    Obesity (BMI 30.0-34.9) Body mass index is 34.95 kg/m.     Chronic venous stasis dermatitis/venous stasis ulcers Continue Unna boots. Wounds as documented by wound care nurse.    MRSA carrier Contact precautions, decontamination therapy ordered.   Family Communication/Anticipated D/C date and plan/Code Status   DVT prophylaxis: None. Blood thinners contraindicated and SCDs caused extensive bruising. Code Status: DO NOT RESUSCITATE.  Family Communication: No family currently at the bedside. Disposition Plan: Unclear. Not stable for discharge.   Medical Consultants:    Hematology/oncology  Cardiology  Palliative Care  Interventional Radiology   Procedures:    None  Anti-Infectives:    None  Subjective:   "I feel about the same".  Says she can't tell any difference in her breathing.  Having pain from "my bottom down". No cough.  Appetite is "okay". Moved her bowels yesterday.    Objective:    Vitals:   11/20/16 1240 11/20/16 2014 11/21/16 0459 11/21/16 0600  BP: 114/69 140/70 (!) 110/51   Pulse: 84 69 72   Resp: _0 Temp: 97.4 F (36.3 C) 98.2 F (36.8 C) 98.2 F (36.8 C)   TempSrc: Oral Oral Oral   SpO2: 96% 93% 96%   Weight:    92.4 kg (203 lb 9.6 oz)  Height:        Intake/Output Summary (Last 24 hours) at 11/21/16 0814 Last data filed at 11/21/16 0600  Gross per 24 hour  Intake             1343 ml  Output             1450 ml  Net             -107 ml   Filed Weights   11/19/16 0347 11/20/16 0647 11/21/16 0600  Weight: 92.4 kg (203 lb 9.6 oz) 92.5 kg (204 lb) 92.4 kg (203 lb 9.6 oz)    Exam: General exam: Chronically ill appearing, uncomfortable.  Respiratory system: Left sided  crackles. Respiratory effort mildly labored. Cardiovascular system: HSIR. No JVD,  rubs, gallops or clicks. No murmurs. Gastrointestinal system: Abdomen is nondistended, soft and nontender. No organomegaly or masses felt. Normal bowel sounds heard. Central nervous system: Alert and oriented. No focal neurological deficits. Extremities: Unna boots on. Skin: No rashes, lesions or ulcers. Psychiatry: Judgement and insight appear normal. Mood & affect appropriate.   Data Reviewed:   I have personally reviewed following labs and imaging studies:  Labs: Basic Metabolic Panel:  Recent Labs Lab 11/17/16 1500 11/17/16 2148 11/18/16 0328 11/19/16 0446 11/20/16 0429 11/21/16 0552  NA 136  --  139 141 139 142  K 4.3  --  4.4 4.2 4.4 4.1  CL 97*  --  99* 101 97* 99*  CO2 30  --  32 35* 33* 36*  GLUCOSE 148*  --  122* 104* 103* 96  BUN 42*  --  41* 43* 43* 45*  CREATININE 1.38* 1.47* 1.32* 1.20* 1.10* 1.09*  CALCIUM 8.8*  --  8.6* 8.6* 8.7* 8.8*   GFR Estimated Creatinine Clearance: 43.1 mL/min (by C-G formula based on SCr of 1.09 mg/dL (H)). Liver Function Tests:  Recent Labs Lab 11/14/16 1020  AST 12  ALT <6  ALKPHOS 74  BILITOT 1.23*  PROT 6.9  ALBUMIN 3.2*    CBC:  Recent Labs Lab 11/14/16 1020 11/17/16 1500 11/17/16 2148 11/20/16 0429 11/21/16 0552 11/21/16 0727  WBC 10.6* 11.3* 10.0 7.9 6.9 [id number]  NEUTROABS 8.9*  --   --   --   --  PENDING  HGB 11.8 11.2* 10.6* 10.3* 10.0* [id number]  HCT 40.7 40.3 38.5 37.8 37.2 [id number]  MCV 87 88.2 88.5 88.7 88.6 [id number]  PLT 24 Platelet count confirmed by slide estimate* PLATELET CLUMPS NOTED ON SMEAR, COUNT APPEARS DECREASED PLATELET CLUMPS NOTED ON SMEAR, COUNT APPEARS DECREASED 13* PENDING [id number]   Cardiac Enzymes:  Recent Labs Lab 11/17/16 2148 11/18/16 0328 11/18/16 0849  TROPONINI <0.03 <0.03 <0.03    Microbiology Recent Results (from the past 240 hour(s))  MRSA PCR Screening     Status: Abnormal     Collection Time: 11/17/16 11:31 PM  Result Value Ref Range Status   MRSA by PCR POSITIVE (A) NEGATIVE Final    Comment:        The GeneXpert MRSA Assay (FDA approved for NASAL specimens only), is one component of a comprehensive MRSA colonization surveillance program. It is not intended to diagnose MRSA infection nor to guide or monitor treatment for MRSA infections. RESULT CALLED TO, READ BACK BY AND VERIFIED WITH: DODOO,E RN 0240 11/18/16 MITCHELL,L     Radiology: No results found.  Medications:   . acidophilus  1 capsule Oral Daily  . Chlorhexidine Gluconate Cloth  6 each Topical Q0600  . digoxin  0.0625 mg Oral Daily  . diltiazem  240 mg Oral Daily  . feeding supplement (PRO-STAT SUGAR FREE 64)  30 mL Oral BID  . furosemide  80 mg Intravenous BID  . gabapentin  600 mg Oral BID  . metoprolol  100 mg Oral BID  . morphine  15 mg Oral Q8H  . mupirocin ointment  1 application Nasal BID  . pantoprazole  40 mg Oral Daily  . polyvinyl alcohol  2 drop Both Eyes BID  . potassium chloride SA  20 mEq Oral BID  . sodium chloride  1 spray Each Nare TID  . sodium chloride flush  3 mL Intravenous Q12H  . valACYclovir  500 mg Oral Daily   Continuous Infusions:  Medical decision making is of high complexity and this patient is at high risk of deterioration, therefore this is a level 3 visit.  (> 4 problem points, 2 data points, high risk)    LOS: 3 days   Korah Hufstedler  [facility] Pager [phone]. If unable to reach me by pager, please call my cell phone at [phone].  *Please refer to [email or web address], password [email or web address] to get updated schedule on who will round on this patient, as hospitalists switch teams weekly. If 7PM-7AM, please contact night-coverage at [email or web address], password [email or web address] for any overnight needs.  11/21/2016, 8:14 AM

## 2016-11-21 NOTE — Progress Notes (Signed)
Progress Note  Patient Name: [name] [name] Date of Encounter: 11/21/2016  Primary Cardiologist: Dr. Vita Barley   Subjective   No chest pain or SOB but does complain of Rt leg pain,  Her legs are very painful today.   Inpatient Medications    Scheduled Meds: . acidophilus  1 capsule Oral Daily  . Chlorhexidine Gluconate Cloth  6 each Topical Q0600  . digoxin  0.0625 mg Oral Daily  . diltiazem  240 mg Oral Daily  . feeding supplement (PRO-STAT SUGAR FREE 64)  30 mL Oral BID  . furosemide  80 mg Intravenous BID  . gabapentin  600 mg Oral BID  . metoprolol  100 mg Oral BID  . morphine  15 mg Oral Q8H  . mupirocin ointment  1 application Nasal BID  . pantoprazole  40 mg Oral Daily  . polyvinyl alcohol  2 drop Both Eyes BID  . potassium chloride SA  20 mEq Oral BID  . sodium chloride  1 spray Each Nare TID  . sodium chloride flush  3 mL Intravenous Q12H  . valACYclovir  500 mg Oral Daily   Continuous Infusions:  PRN Meds: sodium chloride, acetaminophen, albuterol, ondansetron (ZOFRAN) IV, oxyCODONE, sodium chloride flush   Vital Signs    Vitals:   11/21/16 0459 11/21/16 0600 11/21/16 1013 11/21/16 1217  BP: (!) 110/51  (!) 151/75 (!) 123/59  Pulse: 72  80 79  Resp: 18   20  Temp: 98.2 F (36.8 C)  98.3 F (36.8 C) 98.3 F (36.8 C)  TempSrc: Oral  Oral Oral  SpO2: 96%  90% 90%  Weight:  203 lb 9.6 oz (92.4 kg)    Height:        Intake/Output Summary (Last 24 hours) at 11/21/16 1416 Last data filed at 11/21/16 1018  Gross per 24 hour  Intake              643 ml  Output             1950 ml  Net            -1307 ml   Filed Weights   11/19/16 0347 11/20/16 0647 11/21/16 0600  Weight: 203 lb 9.6 oz (92.4 kg) 204 lb (92.5 kg) 203 lb 9.6 oz (92.4 kg)    Telemetry    A fib occ up to 100 mostly controlled - Personally Reviewed  ECG    No new - Personally Reviewed  Physical Exam   GEN: No acute distress.   Neck: No JVD Cardiac: irreg irreg , no  murmurs, rubs, or gallops.  Respiratory: Clear to auscultation bilaterally does have few rales in bases. GI: Soft, nontender, non-distended  MS: No edema; No deformity, but feet are dusky, they do blanche slightly slow capillary refill , legs with ace wrapping and compression stocking.   Neuro:  Nonfocal  Psych: Normal affect   Labs    Chemistry Recent Labs Lab 11/19/16 0446 11/20/16 0429 11/21/16 0552  NA 141 139 142  K 4.2 4.4 4.1  CL 101 97* 99*  CO2 35* 33* 36*  GLUCOSE 104* 103* 96  BUN 43* 43* 45*  CREATININE 1.20* 1.10* 1.09*  CALCIUM 8.6* 8.7* 8.8*  GFRNONAA 41* 45* 46*  GFRAA 47* 52* 53*  ANIONGAP _0 Hematology Recent Labs Lab 11/20/16 0429 11/21/16 0552 11/21/16 0727  WBC 7.9 6.9 [id number]  RBC 4.26 4.20 [id number]  HGB 10.3* 10.0* [id number]  HCT  37.8 37.2 [id number]  MCV 88.7 88.6 [id number]  MCH 24.2* 23.8* [id number]  MCHC 27.2* 26.9* [id number]  RDW 19.0* 19.2* [id number]  PLT 13* 20* [id number]    Cardiac Enzymes Recent Labs Lab 11/17/16 2148 11/18/16 0328 11/18/16 0849  TROPONINI <0.03 <0.03 <0.03    Recent Labs Lab 11/17/16 1509  TROPIPOC 0.00     BNP Recent Labs Lab 11/17/16 1500  BNP 1,492.9*     DDimer No results for input(s): DDIMER in the last 168 hours.   Radiology    No results found.  Cardiac Studies   2-D echocardiogram 11/18/2016: Study Conclusions - Left ventricle: The cavity size was normal. There was moderate concentric hypertrophy. Systolic function was vigorous. The estimated ejection fraction was in the range of 65% to 70%. Wall motion was normal; there were no regional wall motion abnormalities. The study was not technically sufficient to allow evaluation of LV diastolic dysfunction due to atrialfibrillation. - Ventricular septum: The contour showed diastolic flattening and systolic flattening. - Aortic valve: There was moderate regurgitation. - Ascending aorta: The ascending aorta was  normal in size. - Mitral valve: There was mild regurgitation. - Left atrium: The atrium was moderately dilated. - Right ventricle: Systolic function was normal. - Right atrium: The atrium was moderately dilated. - Tricuspid valve: There was moderate-severe regurgitation. - Pulmonary arteries: Systolic pressure was severely increased. PA peak pressure: 94 mm Hg (S). - Inferior vena cava: The vessel was dilated. The respirophasic diameter changes were blunted (<50%), consistent with elevated central venous pressure. - Pericardium, extracardiac: There was no pericardial effusion. Impressions: - When compared to the prior study from 09/13/2016 LVEF remains the same 65-70%. There is moderate aortic regurgitation. RVSP is now severely elevated at 94 mmHg, previously 46 mmHg.   Patient Profile     83 y.o. female w/ hx D-CHF, PAF not anticoagulated, DES x 2 LAD 2014 on ASA/Plavix, HTN, HLD, CKD III thrombocytopenia, DNR, was admitted 03/03 with SOB, AMS. Cards saw 03/05 for CHF   Assessment & Plan    1. Acute on chronic respiratory failure, multifactorial  P02 at 90% on 6L [place].   2. Acute on chronic diastolic heart failure with clinical exam evidence of volume overload and elevated BNP - Lasix increased 40 mg bid>>80 IV BID yesterday  May need to decrease or change to po home torsemide.   GFR trending up  - wt down < 1 lb since yesterday. , I/O net neg 3.5 L since admit-  - wt last month was in the 190s, otherwise all weights are over 200 - think dry weight is right at 200.  3. Chronic kidney disease stage III with GFR 53, stable creatinine - continue to follow daily while diuresing - GFR was 32 on admit, trending up, was 45 on 10/2016  4. Permanent atrial fibrillation, not on anticoagulation -rate generally ok  5. Severe pulmonary hypertension, likely secondary to diastolic heart failure and chronic hypoxemia - continue diuresis  Otherwise, per IM 6.  Thrombocytopenia, chronic and stable, followed by hematology as an outpatient  And now pt for bone biopsy tomorrow.   7. Anemia, chronic without overt bleeding stable.  8. Leg pain bil from knees down  9. DNR  palliative care has seen  Signed, Cecilie Kicks, NP  11/21/2016, 2:16 PM    Patient seen, examined. Available data reviewed. Agree with findings, assessment, and plan as outlined by Cecilie Kicks, NP. Pt seen evening of 3/8. Exam reveals elderly woman in NAD, lungs CTA,  heart irregular without murmur, legs dressed, abdomen soft, NT.   She continue to exhibit fairly poor insight. She is adamant about eating salt. Says she would rather die than not eat salt. We allowed her to salt her dinner. Plans for BM biopsy reviewed and discussed with patient. Not an anticoagulation candidate with severe thrombocytopenia. Cardiac status is stable. Really don't have much else to add. Would continue torsemide. She's on a huge dose at home of 160 mg BID. Would decrease to 80 mg BID and FU as outpatient. Please call if questions arise. thx  Sherren Mocha, M.D. 11/22/2016 1:38 PM

## 2016-11-21 NOTE — Consult Note (Signed)
Referral MD  Reason for Referral: Persistent thrombocytopenia   Chief Complaint  Patient presents with  . Fluid retention  : My platelets have been low for a while.  HPI: [name] is well-known to me. She is an 83 year old white female. She has multiple health problems. She has congestive heart failure. She has atrial fibrillation. She is not on anticoagulation because of thrombocytopenia.  We've been following her for thrombocytopenia for quite a while. I felt that but we're doing with was immune-based thrombocytopenia. We have her on Nplate. However, the Nplate really has not been working. Plan count has not been going up as I wouldn't have expected.  She was admitted because of congestive heart failure. She has bad venous stasis in her lower extremities. He has wrappings on her legs.  On the 28th of febrile, her plate count was 18,299. More recent studies show her plate count has been "clumped".  On March 6, her CBC showed white cell count 7.9. Hemoglobin 10.3. Platelet count 13,000.  She's had no obvious bleeding.  I'm not sure if she is planning to go home soon.  Overall, I would say that her performance status is at best ECOG 3.     Past Medical History:  Diagnosis Date  . Atrial fibrillation ([place])   . CAD (coronary artery disease)    a. NSTEMI 2/14 => LHC 10/20/12:  dLM 20%, pLAD 99%, mLAD 60% followed by 99%, oD1 99%, and pD2 30%, mild plaque disease in the CFX and RCA. PCI: Xience Xpedition DES to the proximal and mid LAD  . Chronic diastolic CHF (congestive heart failure) ([place])    Echo (08/10/13): Mild LVH, EF 60-65%.  . DJD (degenerative joint disease)   . Dyslipidemia   . HTN (hypertension)   . Osteopenia   . Thrombocytopenia ([place])   . Venous ulcer   :  Past Surgical History:  Procedure Laterality Date  . BACK SURGERY     Lumbar disc  . EYE SURGERY     Blepharoplasty  . HIP SURGERY     R THR  . INSERTION / PLACEMENT / REVISION NEUROSTIMULATOR    .  JOINT REPLACEMENT    . LEFT HEART CATHETERIZATION WITH CORONARY ANGIOGRAM N/A 10/20/2012   Procedure: LEFT HEART CATHETERIZATION WITH CORONARY ANGIOGRAM;  Surgeon: Burnell Blanks, MD;  Location[facility] CATH LAB;  Service: Cardiovascular;  Laterality: N/A;  :   Current Facility-Administered Medications:  .  0.9 %  sodium chloride infusion, 250 mL, Intravenous, PRN, Velvet Bathe, MD .  acetaminophen (TYLENOL) tablet 650 mg, 650 mg, Oral, Q4H PRN, Velvet Bathe, MD .  acidophilus (RISAQUAD) capsule 1 capsule, 1 capsule, Oral, Daily, Velvet Bathe, MD, 1 capsule at 11/20/16 1029 .  albuterol (PROVENTIL) (2.5 MG/3ML) 0.083% nebulizer solution 2.5 mg, 2.5 mg, Nebulization, Q6H PRN, Velvet Bathe, MD .  Chlorhexidine Gluconate Cloth 2 % PADS 6 each, 6 each, Topical, Q0600, Velvet Bathe, MD, 6 each at 11/21/16 [phone] .  digoxin (LANOXIN) tablet 0.0625 mg, 0.0625 mg, Oral, Daily, Velvet Bathe, MD, 0.0625 mg at 11/20/16 1029 .  diltiazem (CARDIZEM CD) 24 hr capsule 240 mg, 240 mg, Oral, Daily, Velvet Bathe, MD, 240 mg at 11/20/16 1029 .  feeding supplement (PRO-STAT SUGAR FREE 64) liquid 30 mL, 30 mL, Oral, BID, Velvet Bathe, MD, 30 mL at 11/20/16 2341 .  furosemide (LASIX) injection 80 mg, 80 mg, Intravenous, BID, Rhonda G Barrett, PA-C, 80 mg at 11/20/16 1839 .  gabapentin (NEURONTIN) capsule 600 mg, 600 mg,  Oral, BID, Velvet Bathe, MD, 600 mg at 11/20/16 2342 .  metoprolol (LOPRESSOR) tablet 100 mg, 100 mg, Oral, BID, Velvet Bathe, MD, 100 mg at 11/20/16 2342 .  morphine (MS CONTIN) 12 hr tablet 15 mg, 15 mg, Oral, Q8H, Lily Kocher, MD, 15 mg at 11/21/16 [phone] .  mupirocin ointment (BACTROBAN) 2 % 1 application, 1 application, Nasal, BID, Velvet Bathe, MD, 1 application at 81/19/14 2344 .  ondansetron (ZOFRAN) injection 4 mg, 4 mg, Intravenous, Q6H PRN, Velvet Bathe, MD .  oxyCODONE (Oxy IR/ROXICODONE) immediate release tablet 5 mg, 5 mg, Oral, Q6H PRN, Velvet Bathe, MD, 5 mg at 11/21/16 0151 .   pantoprazole (PROTONIX) EC tablet 40 mg, 40 mg, Oral, Daily, Velvet Bathe, MD, 40 mg at 11/20/16 1028 .  polyvinyl alcohol (LIQUIFILM TEARS) 1.4 % ophthalmic solution 2 drop, 2 drop, Both Eyes, BID, Robbie Lis, MD, 2 drop at 11/20/16 2343 .  potassium chloride SA (K-DUR,KLOR-CON) CR tablet 20 mEq, 20 mEq, Oral, BID, Velvet Bathe, MD, 20 mEq at 11/20/16 2342 .  sodium chloride (OCEAN) 0.65 % nasal spray 1 spray, 1 spray, Each Nare, TID, Velvet Bathe, MD, 1 spray at 11/20/16 2343 .  sodium chloride flush (NS) 0.9 % injection 3 mL, 3 mL, Intravenous, Q12H, Velvet Bathe, MD, 3 mL at 11/20/16 2341 .  sodium chloride flush (NS) 0.9 % injection 3 mL, 3 mL, Intravenous, PRN, Velvet Bathe, MD .  valACYclovir (VALTREX) tablet 500 mg, 500 mg, Oral, Daily, Velvet Bathe, MD, 500 mg at 11/20/16 1028:  . acidophilus  1 capsule Oral Daily  . Chlorhexidine Gluconate Cloth  6 each Topical Q0600  . digoxin  0.0625 mg Oral Daily  . diltiazem  240 mg Oral Daily  . feeding supplement (PRO-STAT SUGAR FREE 64)  30 mL Oral BID  . furosemide  80 mg Intravenous BID  . gabapentin  600 mg Oral BID  . metoprolol  100 mg Oral BID  . morphine  15 mg Oral Q8H  . mupirocin ointment  1 application Nasal BID  . pantoprazole  40 mg Oral Daily  . polyvinyl alcohol  2 drop Both Eyes BID  . potassium chloride SA  20 mEq Oral BID  . sodium chloride  1 spray Each Nare TID  . sodium chloride flush  3 mL Intravenous Q12H  . valACYclovir  500 mg Oral Daily  :  Allergies  Allergen Reactions  . Keflex [Cephalexin] Other (See Comments)    intolerance  :  Family History  Problem Relation Age of Onset  . CAD Father 48    Vague history   :  Social History   Social History  . Marital status: Divorced    Spouse name: N/A  . Number of children: 2  . Years of education: N/A   Occupational History  .  Retired   Social History Main Topics  . Smoking status: Never Smoker  . Smokeless tobacco: Never Used  . Alcohol use  No  . Drug use: No  . Sexual activity: Not on file   Other Topics Concern  . Not on file   Social History Narrative   Lives at [place].  Two adopted children. Divorced.    :  Pertinent items are noted in HPI.  Exam: Patient Vitals for the past 24 hrs:  BP Temp Temp src Pulse Resp SpO2 Weight  11/21/16 0600 - - - - - - 203 lb 9.6 oz (92.4 kg)  11/21/16 0459 (!) 110/51 98.2 F (36.8  C) Oral 72 18 96 % -  11/20/16 2014 140/70 98.2 F (36.8 C) Oral 69 18 93 % -  11/20/16 1240 114/69 97.4 F (36.3 C) Oral 84 18 96 % -  11/20/16 1000 128/69 97.8 F (36.6 C) Oral 84 16 95 % -    As above    Recent Labs  11/20/16 0429  WBC 7.9  HGB 10.3*  HCT 37.8  PLT 13*    Recent Labs  11/19/16 0446 11/20/16 0429  NA 141 139  K 4.2 4.4  CL 101 97*  CO2 35* 33*  GLUCOSE 104* 103*  BUN 43* 43*  CREATININE 1.20* 1.10*  CALCIUM 8.6* 8.7*    Blood smear review:  None  Pathology: None     Assessment and Plan:  Ms. Carns is an 83 year old white female. She has thrombocytopenia.  When we have seen her in the office, I looked at her blood and the microscope. I just have not been all that impressed. I thought that everything looked consistent with ITP. However, she just has not responded to Nplate.  It is possible she may have myelodysplasia. At her age, this would be very reasonable to consider.  I cannot feel a spleen on her. I would not think that myelofibrosis would be a problem. She does have mild anemia.  I think that this is one of the situations where we probably do need to have a bone marrow test done. At least this will give [place] an idea as to what is going on with her lately and if there is anything we can offer. Given her overall or performance status, I would be surprised we could even do anything to try to help her out. Remaining of his having to transfuse her for any bleeding episodes.  As always, it was nice seen her. She is very nice.  I appreciate  everybody's help. I know that she has other comorbid health problems that there is very little that can be done. I agree with the palliative care staff helping her out.  We will follow along. I think if her platelet count gets below 10,000, she may need to be transfused prophylactically.  Lattie Haw, MD  Oswaldo Milian 41:10

## 2016-11-21 NOTE — Progress Notes (Signed)
Daily Progress Note   Patient Name: [name] [name]       Date: 11/21/2016 DOB: 1931/04/11  Age: 83 y.o. MRN#: [id number] Attending Physician: Venetia Maxon Rama, MD Primary Care Physician: Mathews Argyle, MD Admit Date: 11/17/2016  Reason for Consultation/Follow-up: Establishing goals of care and Psychosocial/spiritual support  Subjective: [name] [name] feels unchanged from yesterday, however she does appear more comfortable in bed with less work of breathing and no further wheezing. She remains frustrated by her fluid restriction, and is focused on being discharged home. She does not feel this hospitalization has improved her at all.   Length of Stay: 3  Current Medications: Scheduled Meds:  . acidophilus  1 capsule Oral Daily  . Chlorhexidine Gluconate Cloth  6 each Topical Q0600  . digoxin  0.0625 mg Oral Daily  . diltiazem  240 mg Oral Daily  . feeding supplement (PRO-STAT SUGAR FREE 64)  30 mL Oral BID  . furosemide  80 mg Intravenous BID  . gabapentin  600 mg Oral BID  . metoprolol  100 mg Oral BID  . morphine  15 mg Oral Q8H  . mupirocin ointment  1 application Nasal BID  . pantoprazole  40 mg Oral Daily  . polyvinyl alcohol  2 drop Both Eyes BID  . potassium chloride SA  20 mEq Oral BID  . sodium chloride  1 spray Each Nare TID  . sodium chloride flush  3 mL Intravenous Q12H  . valACYclovir  500 mg Oral Daily    Continuous Infusions:   PRN Meds: sodium chloride, acetaminophen, albuterol, ondansetron (ZOFRAN) IV, oxyCODONE, sodium chloride flush  Physical Exam   Constitutional: She is oriented to person, place, and time. She appears well-developed and well-nourished.  HENT:  Head: Normocephalic and atraumatic.  Mouth/Throat: Oropharynx is clear and moist. No oropharyngeal exudate.  Eyes: EOM are normal.  Neck: Normal  range of motion.  Cardiovascular: An irregularly irregular rhythm present.  Pulmonary/Chest:  WOB improved, only present with prolonged conversation. No wheeze. Abdominal: Soft. Bowel sounds are normal.  Musculoskeletal:  BLE wrapped, did not assess per pt request  Neurological: She is alert and oriented to person, place, and time.  Slow to respond  Skin: Skin is warm and dry. Bruising noted. There is pallor.  Psychiatric: Judgment and thought content normal. Her speech is delayed. She is slowed and withdrawn. Cognition and memory are normal.  Very flat affect.      Vital Signs: BP (!) 110/51 (BP Location: Left Arm)   Pulse 72   Temp 98.2 F (36.8 C) (Oral)   Resp 18   Ht _0  (1.626 m)   Wt 92.4 kg (203 lb 9.6 oz)   SpO2 96%   BMI 34.95 kg/m  SpO2: SpO2: 96 % O2 Device: O2 Device: Nasal Cannula O2 Flow Rate: O2 Flow Rate (L/min): 6 L/min  Intake/output summary:  Intake/Output Summary (Last 24 hours) at 11/21/16 0847 Last data filed at 11/21/16 0600  Gross per 24 hour  Intake             1343 ml  Output             1450 ml  Net             -  107 ml   LBM: Last BM Date: 11/20/16 Baseline Weight: Weight: 88.5 kg (195 lb) Most recent weight: Weight: 92.4 kg (203 lb 9.6 oz)  Palliative Assessment/Data: PPS 40%   Flowsheet Rows   Flowsheet Row Most Recent Value  Intake Tab  Referral Department  Hospitalist  Unit at Time of Referral  Cardiac/Telemetry Unit  Palliative Care Primary Diagnosis  Cardiac  Date Notified  11/20/16  Palliative Care Type  New Palliative care  Reason for referral  Clarify Goals of Care  Date of Admission  11/17/16  # of days IP prior to Palliative referral  3  Clinical Assessment  Psychosocial & Spiritual Assessment  Palliative Care Outcomes      Patient Active Problem List   Diagnosis Date Noted  . Stage III chronic kidney disease 11/21/2016  . Acute on chronic diastolic CHF (congestive heart failure) ([place]) 11/20/2016  . Goals of  care, counseling/discussion   . Palliative care by specialist   . Chronic ITP (idiopathic thrombocytopenia) ([place]) 10/01/2016  . HCAP (healthcare-associated pneumonia) 09/12/2016  . Gangrene ([place])   . Acute on chronic respiratory failure with hypoxia ([employer])   . Coronary atherosclerosis of native coronary artery 10/09/2013  . Atrial fibrillation ([place]) 10/09/2013  . Leukocytosis 08/11/2013  . Sepsis ([place]) 08/10/2013  . Chronic narcotic dependence ([place]) 07/16/2013  . Varicose veins of lower extremities with other complications 25/85/2778  . Edema 05/14/2013  . Severe sepsis ([place]) 12/26/2012  . Hypokalemia 12/20/2012  . CAD (coronary artery disease) of artery bypass graft 12/18/2012  . Unstable angina ([place]) 10/18/2012  . Thrombocytopenia ([place]) 10/18/2012  . HTN (hypertension) 10/17/2012  . Chronic back pain 10/17/2012  . Precordial pain 10/17/2012    Palliative Care Assessment & Plan   HPI: 83 y.o. female  with past medical history of dCHF, pulmonary hypertension, A. Fib not on anticoagulation, CAD, HTN, ITP, PVD with severe venous insufficiency, CKD stage 3, chronic hypoxemia on 3L O2, and DJD s/p lumbar disk surgery. She was brought to the ED from [facility] for shortness of breath with concern for fluid retention despite increased lasix. She was admitted on 11/17/2016 with acute respiratory failure with hypoxia, secondary to CHF exacerbation. Cardiology following, however not a lot to offer to significantly impact her prognosis. Palliative consulted to assist in goals of care.   Assessment: I initially met with [name] [name] on 3/6, please see my consult note for full details. In brief, she has very poor performance status with a feeling of "just existing." That said, she seems to be okay with her current quality of life. She was unable to clarify if her goal is to live longer, or try to live better. We also discussed identifying a HCPOA.   Today, [name] [name] feels relatively unchanged from yesterday.  She is eager for discharge, as she is tired of our fluid restriction and does not feel our interventions here have improved anything. That said, she indicated she does plan to return to the hospital in the future as needed. After consideration, she also told me that she would want to pursue things (up to DNR status) to enable her to live longer, as she feels her current quality of life is "fine."  In terms of HCPOA paperwork, [name] [name] feels strongly that she has already filled out the paperwork and has a copy at home. I did call her facility to see if they had anything on file, which they do not. Given that she has two children but one is estranged, I again encouraged  her to formalize a HCPOA just in case the paperwork wasn't found. At this point she prefers to go home and see if she has it, and will address it after discharge.  Recommendations/Plan:  DNR, continue current scope of care  Would recommend having Palliative follow after discharge  *Goals of care have been clarified for [name] [name], and she denies any significant symptom burden. Palliative will now follow chart peripherally. Please call us directly with any concerns or questions.   Goals of Care and Additional Recommendations:  Limitations on Scope of Treatment: Full Scope Treatment  Code Status:  DNR  Prognosis:   Unable to determine  Discharge Planning:  SNF with palliative  Care plan was discussed with pt  Thank you for allowing the Palliative Medicine Team to assist in the care of this patient.  Total time: 35 minutes    Greater than 50%  of this time was spent counseling and coordinating care related to the above assessment and plan.  Charlynn Court, NP Palliative Medicine Team [phone] pager (7a-5p) Team Phone # [phone]

## 2016-11-21 NOTE — Progress Notes (Signed)
Vital sings stable, patient up to bedside commode and complaining of leg pain throughout the night. Gave pain meds and repositioned.

## 2016-11-21 NOTE — Consult Note (Signed)
Chief Complaint: Patient was seen in consultation today for thrombocytopenia  Referring Physician(s):  Dr. Burney Gauze  Supervising Physician: Sandi Mariscal  Patient Status: [facility] - In-pt  History of Present Illness: [name] [name] is a 83 y.o. female with past medical history of afib, CAD, NSTEMI, CHF, HTN, and ITP who presented to [facility] with shortness of breath.  Since admission patient has had a low platelet count of 24K, but recent lab work shows a declinein her platelets to 13K. IR consulted for bone marrow biopsy at the request of Dr. Burney Gauze.   Case reviewed by Dr. Pascal Lux who feels patient is appropriate for procedure.  Note Palliative care has been following with patient due to multiple comorbidities.  At the time, patient wishes to pursue bone marrow biopsy.     She is on Plavix at home.  Her last dose was 3/5 10AM.  She has not been on anti-coagulation since.   Past Medical History:  Diagnosis Date  . Atrial fibrillation ([place])   . CAD (coronary artery disease)    a. NSTEMI 2/14 => LHC 10/20/12:  dLM 20%, pLAD 99%, mLAD 60% followed by 99%, oD1 99%, and pD2 30%, mild plaque disease in the CFX and RCA. PCI: Xience Xpedition DES to the proximal and mid LAD  . Chronic diastolic CHF (congestive heart failure) ([place])    Echo (08/10/13): Mild LVH, EF 60-65%.  . DJD (degenerative joint disease)   . Dyslipidemia   . HTN (hypertension)   . Osteopenia   . Thrombocytopenia ([place])   . Venous ulcer     Past Surgical History:  Procedure Laterality Date  . BACK SURGERY     Lumbar disc  . EYE SURGERY     Blepharoplasty  . HIP SURGERY     R THR  . INSERTION / PLACEMENT / REVISION NEUROSTIMULATOR    . JOINT REPLACEMENT    . LEFT HEART CATHETERIZATION WITH CORONARY ANGIOGRAM N/A 10/20/2012   Procedure: LEFT HEART CATHETERIZATION WITH CORONARY ANGIOGRAM;  Surgeon: Burnell Blanks, MD;  Location: [facility] CATH LAB;  Service: Cardiovascular;  Laterality: N/A;     Allergies: Keflex [cephalexin]  Medications: Prior to Admission medications   Medication Sig Start Date End Date Taking? Authorizing Provider  albuterol (PROVENTIL) (2.5 MG/3ML) 0.083% nebulizer solution Take 2.5 mg by nebulization every 6 (six) hours as needed for wheezing or shortness of breath.   Yes Historical Provider, MD  Amino Acids-Protein Hydrolys (FEEDING SUPPLEMENT, PRO-STAT SUGAR FREE 64,) LIQD Take 30 mLs by mouth 2 (two) times daily.   Yes Historical Provider, MD  Calcium Carbonate-Vitamin D (CALCARB 600/D PO) Take 2 tablets by mouth daily.   Yes Historical Provider, MD  clopidogrel (PLAVIX) 75 MG tablet Take 1 tablet (75 mg total) by mouth daily with breakfast. 08/18/13  Yes Velvet Bathe, MD  digoxin 62.5 MCG TABS Take 0.0625 mg by mouth daily. 09/21/16  Yes Jennifer Chahn-Yang Choi, DO  diltiazem (CARDIZEM CD) 240 MG 24 hr capsule Take 1 capsule (240 mg total) by mouth daily. 08/18/13  Yes Velvet Bathe, MD  famciclovir (FAMVIR) 250 MG tablet Take 1 tablet (250 mg total) by mouth daily. 08/03/16  Yes Eliezer Bottom, NP  gabapentin (NEURONTIN) 600 MG tablet Take 1 tablet (600 mg total) by mouth 2 (two) times daily. 09/20/16  Yes Jennifer Chahn-Yang Choi, DO  Lactobacillus Acid-Pectin (ACIDOPHILUS/CITRUS PECTIN) TABS Take 1 tablet by mouth daily.   Yes Historical Provider, MD  metoprolol (LOPRESSOR) 100 MG tablet Take  1 tablet (100 mg total) by mouth 2 (two) times daily. 08/18/13  Yes Velvet Bathe, MD  morphine (KADIAN) 20 MG 24 hr capsule Take 20 mg by mouth 2 (two) times daily.    Yes Historical Provider, MD  oxyCODONE (OXY IR/ROXICODONE) 5 MG immediate release tablet Take 1 tablet (5 mg total) by mouth every 6 (six) hours as needed for breakthrough pain (pain). Patient taking differently: Take 5 mg by mouth 4 (four) times daily.  08/18/13  Yes Velvet Bathe, MD  pantoprazole (PROTONIX) 40 MG tablet Take 1 tablet (40 mg total) by mouth daily. 08/03/16  Yes Eliezer Bottom, NP   Polyethyl Glycol-Propyl Glycol (SYSTANE OP) Place 1 drop into both eyes 2 (two) times daily.   Yes Historical Provider, MD  potassium chloride SA (K-DUR,KLOR-CON) 20 MEQ tablet Take 1 tablet (20 mEq total) by mouth 2 (two) times daily. 08/28/13  Yes Liliane Shi, PA-C  sodium chloride (OCEAN) 0.65 % SOLN nasal spray Place 1 spray into both nostrils 3 (three) times daily.    Yes Historical Provider, MD  torsemide (DEMADEX) 20 MG tablet Take 160 mg by mouth 2 (two) times daily.  11/17/16 11/19/16 Yes Historical Provider, MD  predniSONE (DELTASONE) 10 MG tablet Take 4 tabs for 3 days, then 3 tabs for 3 days, then 2 tabs for 3 days, then 1 tab for 3 days, then 1/2 tab for 4 days. Patient not taking: Reported on 11/17/2016 09/20/16   Shon Millet, DO     Family History  Problem Relation Age of Onset  . CAD Father 30    Vague history     Social History   Social History  . Marital status: Divorced    Spouse name: N/A  . Number of children: 2  . Years of education: N/A   Occupational History  .  Retired   Social History Main Topics  . Smoking status: Never Smoker  . Smokeless tobacco: Never Used  . Alcohol use No  . Drug use: No  . Sexual activity: Not Asked   Other Topics Concern  . None   Social History Narrative   Lives at [facility].  Two adopted children. Divorced.      Review of Systems  Constitutional: Negative for fatigue and fever.  Respiratory: Negative for cough and shortness of breath.   Cardiovascular: Negative for chest pain.  Gastrointestinal: Negative for abdominal pain.  Psychiatric/Behavioral: Negative for behavioral problems and confusion.    Vital Signs: BP (!) 110/51 (BP Location: Left Arm)   Pulse 72   Temp 98.2 F (36.8 C) (Oral)   Resp 18   Ht _0  (1.626 m)   Wt 203 lb 9.6 oz (92.4 kg)   SpO2 96%   BMI 34.95 kg/m   Physical Exam  Constitutional: She is oriented to person, place, and time. She appears well-developed.   Cardiovascular: Normal rate, regular rhythm and normal heart sounds.   Pulmonary/Chest: Effort normal and breath sounds normal. No respiratory distress.  Neurological: She is alert and oriented to person, place, and time.  Skin: Skin is warm and dry.  Psychiatric: She has a normal mood and affect. Her behavior is normal. Judgment and thought content normal.  Nursing note and vitals reviewed.   Mallampati Score:  MD Evaluation Airway: WNL Heart: WNL Abdomen: WNL Chest/ Lungs: WNL ASA  Classification: 3 Mallampati/Airway Score: Two  Imaging: Dg Chest 2 View  Result Date: 11/17/2016 CLINICAL DATA:  Fluid retention EXAM: CHEST  2 VIEW  COMPARISON:  09/13/2016 FINDINGS: Chronic cardiomegaly and vascular pedicle widening. Diffuse interstitial opacity that is similar to multiple priors. There is low volumes with interstitial crowding. No air bronchogram, effusion or pneumothorax. Severe glenohumeral osteoarthritis. Spinal fusion and dorsal column stimulator. IMPRESSION: Cardiomegaly and chronic pulmonary venous congestion. When accounting for chronic interstitial markings, no definitive edema. Electronically Signed   By: Monte Fantasia M.D.   On: 11/17/2016 16:00    Labs:  CBC:  Recent Labs  11/17/16 2148 11/20/16 0429 11/21/16 0552 11/21/16 0727  WBC 10.0 7.9 6.9 [id number]  HGB 10.6* 10.3* 10.0* [id number]  HCT 38.5 37.8 37.2 [id number]  PLT PLATELET CLUMPS NOTED ON SMEAR, COUNT APPEARS DECREASED 13* 20* [id number]    COAGS:  Recent Labs  09/12/16 1206 11/21/16 0831  INR 1.47 1.22  APTT 40*  --     BMP:  Recent Labs  11/18/16 0328 11/19/16 0446 11/20/16 0429 11/21/16 0552  NA 139 141 139 142  K 4.4 4.2 4.4 4.1  CL 99* 101 97* 99*  CO2 32 35* 33* 36*  GLUCOSE 122* 104* 103* 96  BUN 41* 43* 43* 45*  CALCIUM 8.6* 8.6* 8.7* 8.8*  CREATININE 1.32* 1.20* 1.10* 1.09*  GFRNONAA 36* 41* 45* 46*  GFRAA 42* 47* 52* 53*    LIVER FUNCTION TESTS:  Recent Labs   10/15/16 1121 11/02/16 1354 11/08/16 0942 11/14/16 1020  BILITOT 1.30 0.6 0.98 1.23*  AST _0 ALT _1 <6  ALKPHOS 59 67 79 74  PROT 6.8 6.8 7.2 6.9  ALBUMIN 3.0* 3.4* 3.2* 3.2*    TUMOR MARKERS: No results for input(s): AFPTM, CEA, CA199, CHROMGRNA in the last 8760 hours.  Assessment and Plan: Patient with history of thrombocytopenia presents with worsening platelet count, now 13K.   IR consulted for bone marrow biopsy at the request of Dr. Marin Olp.  INR ordered for today.  NPO orders in place for midnight.  Discussed labs and medications with Dr. Pascal Lux.  Anticipate procedure tomorrow.  Risks and benefits discussed with the patient including, but not limited to bleeding, infection, damage to adjacent structures or low yield requiring additional tests. All of the patient's questions were answered, patient is agreeable to proceed. Consent signed and in chart.  Thank you for this interesting consult.  I greatly enjoyed meeting [place] and look forward to participating in their care.  A copy of this report was sent to the requesting provider on this date.  Electronically Signed: Docia Barrier 11/21/2016, 10:21 AM   I spent a total of 40 Minutes    in face to face in clinical consultation, greater than 50% of which was counseling/coordinating care for thrombocytopenia.

## 2016-11-22 ENCOUNTER — Inpatient Hospital Stay (HOSPITAL_COMMUNITY): Payer: Medicare Other

## 2016-11-22 DIAGNOSIS — I872 Venous insufficiency (chronic) (peripheral): Secondary | ICD-10-CM

## 2016-11-22 DIAGNOSIS — I25810 Atherosclerosis of coronary artery bypass graft(s) without angina pectoris: Secondary | ICD-10-CM

## 2016-11-22 LAB — CBC
HEMATOCRIT: 38.6 % (ref 36.0–46.0)
HEMOGLOBIN: 10.5 g/dL — AB (ref 12.0–15.0)
MCH: 24.1 pg — ABNORMAL LOW (ref 26.0–34.0)
MCHC: 27.2 g/dL — ABNORMAL LOW (ref 30.0–36.0)
MCV: 88.5 fL (ref 78.0–100.0)
Platelets: 33 10*3/uL — ABNORMAL LOW (ref 150–400)
RBC: 4.36 MIL/uL (ref 3.87–5.11)
RDW: 19.3 % — AB (ref 11.5–15.5)
WBC: 8.1 10*3/uL (ref 4.0–10.5)

## 2016-11-22 LAB — GLUCOSE, CAPILLARY
GLUCOSE-CAPILLARY: 129 mg/dL — AB (ref 65–99)
Glucose-Capillary: 107 mg/dL — ABNORMAL HIGH (ref 65–99)

## 2016-11-22 LAB — BASIC METABOLIC PANEL
ANION GAP: 8 (ref 5–15)
BUN: 45 mg/dL — AB (ref 6–20)
CHLORIDE: 98 mmol/L — AB (ref 101–111)
CO2: 34 mmol/L — ABNORMAL HIGH (ref 22–32)
Calcium: 8.8 mg/dL — ABNORMAL LOW (ref 8.9–10.3)
Creatinine, Ser: 1.15 mg/dL — ABNORMAL HIGH (ref 0.44–1.00)
GFR calc Af Amer: 50 mL/min — ABNORMAL LOW (ref >60)
GFR calc non Af Amer: 43 mL/min — ABNORMAL LOW (ref >60)
Glucose, Bld: 109 mg/dL — ABNORMAL HIGH (ref 65–99)
POTASSIUM: 4.6 mmol/L (ref 3.5–5.1)
SODIUM: 140 mmol/L (ref 135–145)

## 2016-11-22 MED ORDER — MIDAZOLAM HCL 2 MG/2ML IJ SOLN
INTRAMUSCULAR | Status: AC
  Filled 2016-11-22: qty 4

## 2016-11-22 MED ORDER — SODIUM CHLORIDE 0.9 % IV SOLN
INTRAVENOUS | Status: AC | PRN
  Administered 2016-11-22: 10 mL/h via INTRAVENOUS

## 2016-11-22 MED ORDER — LIDOCAINE HCL 1 % IJ SOLN
INTRAMUSCULAR | Status: AC
  Filled 2016-11-22: qty 20

## 2016-11-22 MED ORDER — TORSEMIDE 20 MG PO TABS
80.0000 mg | ORAL_TABLET | Freq: Two times a day (BID) | ORAL | Status: DC
  Administered 2016-11-22 – 2016-11-23 (×2): 80 mg via ORAL
  Filled 2016-11-22 (×2): qty 4

## 2016-11-22 MED ORDER — FENTANYL CITRATE (PF) 100 MCG/2ML IJ SOLN
INTRAMUSCULAR | Status: AC
  Filled 2016-11-22: qty 4

## 2016-11-22 MED ORDER — MIDAZOLAM HCL 2 MG/2ML IJ SOLN
INTRAMUSCULAR | Status: AC | PRN
  Administered 2016-11-22: 0.5 mg via INTRAVENOUS

## 2016-11-22 MED ORDER — METOLAZONE 5 MG PO TABS
5.0000 mg | ORAL_TABLET | Freq: Every day | ORAL | Status: DC
  Administered 2016-11-22: 5 mg via ORAL
  Filled 2016-11-22 (×2): qty 1

## 2016-11-22 NOTE — Procedures (Signed)
Pre-procedure Diagnosis: Thrombocytopenia Post-procedure Diagnosis: Same  Technically successful CT guided bone marrow aspiration and biopsy of right iliac crest.   Complications: None Immediate  EBL: None  Signed: Sandi Mariscal Pager: [phone] 11/22/2016, 10:14 AM

## 2016-11-22 NOTE — Progress Notes (Signed)
Paged MD regarding pt having IR procedure today, time unknown. Awaiting callback for clarification on pt medications   Pharrell Ledford Leory Plowman

## 2016-11-22 NOTE — Sedation Documentation (Signed)
Dr Pascal Lux aware that sp02 is 52 - 90%.

## 2016-11-22 NOTE — Progress Notes (Signed)
Progress Note    [name] [name]  FYT:[id number] DOB: [birth year]  DOA: 11/17/2016 PCP: Mathews Argyle, MD    Brief Narrative:   Chief complaint: Follow-up CHF  [name] [name] is an 83 y.o. female with a PMH of chronic respiratory failure on 3 L of home oxygen, atrial fibrillation (not on blood thinners secondary to chronic thrombocytopenia), chronic diastolic CHF (EF 17-71 percent on Echo done 12/17), chronic venous stasis of the lower extremities, CAD, hypertension and chronic back pain who was admitted 11/17/16 for evaluation of shortness of breath. BNP was 1492 on admission with chest x-ray showing cardiomegaly and chronic pulmonary venous congestion.  Assessment/Plan:   Principal Problem:   Acute on chronic respiratory failure with hypoxia secondary to Acute on chronic diastolic CHF (congestive heart failure) ([place]) 2-D echo repeated which continues to show a normal EF. Troponins negative. Cardiology consulted. Continue metoprolol, Cardizem, digoxin, Demadex and Lasix.  Seen by palliative care team on 11/20/16 with goals of care established. Continue current scope of treatment. Patient is a DO NOT RESUSCITATE. I/O - 1.5 L on increased dose Lasix and Demadex, but weight unchanged. Creatinine stable. Will try Zaroxolyn.   Active Problems:   HTN (hypertension) Continue Cardizem and metoprolol.    Chronic back pain Continue MS Contin and Neurontin. Continue oxycodone as needed for breakthrough pain.     CAD (coronary artery disease) of artery bypass graft Continue beta blocker.    Atrial fibrillation ([place]) Rate controlled. Continue Cardizem, digoxin and metoprolol. Not a candidate for blood thinners due to ITP and chronic thrombocytopenia.    Chronic ITP (idiopathic thrombocytopenia) ([facility]) Status post bone marrow biopsy today. Transfuse for platelets less than 10. 33K today.  Dr. Marin Olp consulting.    Goals of care, counseling/discussion/Palliative care by  specialist Being followed by the palliative care team. Full scope of treatment but DO NOT RESUSCITATE.    Stage III chronic kidney disease Creatinine stable with diuresis.    Obesity (BMI 30.0-34.9) Body mass index is 35.05 kg/m.     Chronic venous stasis dermatitis/venous stasis ulcers Continue Unna boots. Wounds as documented by wound care nurse.    MRSA carrier Contact precautions, decontamination therapy ordered.   Family Communication/Anticipated D/C date and plan/Code Status   DVT prophylaxis: None. Blood thinners contraindicated and SCDs caused extensive bruising. Code Status: DO NOT RESUSCITATE.  Family Communication: No family currently at the bedside. Disposition Plan: Unclear. Not stable for discharge.   Medical Consultants:    Hematology/oncology  Cardiology  Palliative Care  Interventional Radiology   Procedures:    None  Anti-Infectives:    None  Subjective:   Looks better today, but still says she can't tell any difference in her breathing.  Wants to go back to [facility]. No new complaints.  Appetite poor.  No pain from bone marrow bx, but still has BLE pain.  Objective:    Vitals:   11/21/16 1013 11/21/16 1217 11/21/16 1957 11/22/16 0624  BP: (!) 151/75 (!) 123/59 135/79 (!) 148/70  Pulse: 80 79 82 83  Resp:  _0 Temp: 98.3 F (36.8 C) 98.3 F (36.8 C) 98.2 F (36.8 C) 97.7 F (36.5 C)  TempSrc: Oral Oral Oral Oral  SpO2: 90% 90% 92% 97%  Weight:    92.6 kg (204 lb 3.2 oz)  Height:        Intake/Output Summary (Last 24 hours) at 11/22/16 0730 Last data filed at 11/22/16 1657  Gross per 24 hour  Intake              460 ml  Output             2000 ml  Net            -1540 ml   Filed Weights   11/20/16 0647 11/21/16 0600 11/22/16 0624  Weight: 92.5 kg (204 lb) 92.4 kg (203 lb 9.6 oz) 92.6 kg (204 lb 3.2 oz)    Exam: General exam: Appears more comfortable today.  Respiratory system: Bilateral crackles. Respiratory  effort less labored. Cardiovascular system: HSIR. No JVD,  rubs, gallops or clicks. No murmurs. Gastrointestinal system: Abdomen is nondistended, soft and nontender. No organomegaly or masses felt. Normal bowel sounds heard. Central nervous system: Alert and oriented. No focal neurological deficits. Extremities: Unna boots on. LE swelling above Unna boots. Skin: Venous stasis apparent on toes. Psychiatry: Judgement and insight appear normal. Mood & affect brighter.   Data Reviewed:   I have personally reviewed following labs and imaging studies:  Labs: Basic Metabolic Panel:  Recent Labs Lab 11/18/16 0328 11/19/16 0446 11/20/16 0429 11/21/16 0552 11/22/16 0439  NA 139 141 139 142 140  K 4.4 4.2 4.4 4.1 4.6  CL 99* 101 97* 99* 98*  CO2 32 35* 33* 36* 34*  GLUCOSE 122* 104* 103* 96 109*  BUN 41* 43* 43* 45* 45*  CREATININE 1.32* 1.20* 1.10* 1.09* 1.15*  CALCIUM 8.6* 8.6* 8.7* 8.8* 8.8*   GFR Estimated Creatinine Clearance: 40.9 mL/min (by C-G formula based on SCr of 1.15 mg/dL (H)). Liver Function Tests: No results for input(s): AST, ALT, ALKPHOS, BILITOT, PROT, ALBUMIN in the last 168 hours.  CBC:  Recent Labs Lab 11/17/16 2148 11/20/16 0429 11/21/16 0552 11/21/16 0727 11/22/16 0439  WBC 10.0 7.9 6.9 [id number] 8.1  NEUTROABS  --   --  5.1  --   --   HGB 10.6* 10.3* 10.0* [id number] 10.5*  HCT 38.5 37.8 37.2 DUPW13029 38.6  MCV 88.5 88.7 88.6 [id number] 88.5  PLT PLATELET CLUMPS NOTED ON SMEAR, COUNT APPEARS DECREASED 13* 20* [id number] 33*   Cardiac Enzymes:  Recent Labs Lab 11/17/16 2148 11/18/16 0328 11/18/16 0849  TROPONINI <0.03 <0.03 <0.03    Microbiology Recent Results (from the past 240 hour(s))  MRSA PCR Screening     Status: Abnormal   Collection Time: 11/17/16 11:31 PM  Result Value Ref Range Status   MRSA by PCR POSITIVE (A) NEGATIVE Final    Comment:        The GeneXpert MRSA Assay (FDA approved for NASAL specimens only), is one component  of a comprehensive MRSA colonization surveillance program. It is not intended to diagnose MRSA infection nor to guide or monitor treatment for MRSA infections. RESULT CALLED TO, READ BACK BY AND VERIFIED WITH: DODOO,E RN 1761 11/18/16 MITCHELL,L     Radiology: No results found.  Medications:   . acidophilus  1 capsule Oral Daily  . Chlorhexidine Gluconate Cloth  6 each Topical Q0600  . digoxin  0.0625 mg Oral Daily  . diltiazem  240 mg Oral Daily  . feeding supplement (PRO-STAT SUGAR FREE 64)  30 mL Oral BID  . gabapentin  600 mg Oral BID  . metoprolol  100 mg Oral BID  . morphine  15 mg Oral Q8H  . mupirocin ointment  1 application Nasal BID  . pantoprazole  40 mg Oral Daily  . polyvinyl alcohol  2 drop Both Eyes BID  . potassium chloride SA  20 mEq Oral BID  . sodium chloride  1 spray Each Nare TID  . sodium chloride flush  3 mL Intravenous Q12H  . torsemide  120 mg Oral BID  . valACYclovir  500 mg Oral Daily   Continuous Infusions:  Medical decision making is of high complexity and this patient is at high risk of deterioration, therefore this is a level 3 visit.  (> 4 problem points, 2 data points, high risk)    LOS: 4 days   RAMA,CHRISTINA  [facility] Pager [phone]. If unable to reach me by pager, please call my cell phone at [phone].  *Please refer to [email or web address], password [email or web address] to get updated schedule on who will round on this patient, as hospitalists switch teams weekly. If 7PM-7AM, please contact night-coverage at [email or web address], password [email or web address] for any overnight needs.  11/22/2016, 7:30 AM

## 2016-11-22 NOTE — Sedation Documentation (Signed)
Patient is resting comfortably. 

## 2016-11-22 NOTE — Progress Notes (Signed)
PT Cancellation Note  Patient Details Name: [name] [name] MRN: [id number] DOB: 10/20/1930   Cancelled Treatment:    Reason Eval/Treat Not Completed: Patient declined, no reason specified.  Pt reports she can perform transfers just fine and doesn't need PT.  Had eval and discharge with another therapist on 11/20/16.  Will try again tomorrow to see if pt is more open to therapy.     Ramond Dial 11/22/2016, 11:56 AM   Mee Hives, PT MS Acute Rehab Dept. Number: [place] [facility] [place]

## 2016-11-22 NOTE — Progress Notes (Signed)
Called IR they stated she is on the schedule for 0900. Cardiology returned page stated to give torsemide when pt returns  [place]

## 2016-11-22 NOTE — Sedation Documentation (Addendum)
Patient is resting comfortably. 45 degrees supine in bed

## 2016-11-22 NOTE — Progress Notes (Signed)
Paged ortho tech regarding change of pt una boots. He stated he needed 4 coband wraps and he would be at bedside shortly  [name] [name] [name]

## 2016-11-23 ENCOUNTER — Encounter (HOSPITAL_BASED_OUTPATIENT_CLINIC_OR_DEPARTMENT_OTHER): Payer: Medicare Other | Attending: Internal Medicine

## 2016-11-23 DIAGNOSIS — L97211 Non-pressure chronic ulcer of right calf limited to breakdown of skin: Secondary | ICD-10-CM | POA: Insufficient documentation

## 2016-11-23 DIAGNOSIS — G8929 Other chronic pain: Secondary | ICD-10-CM

## 2016-11-23 DIAGNOSIS — I87323 Chronic venous hypertension (idiopathic) with inflammation of bilateral lower extremity: Secondary | ICD-10-CM | POA: Insufficient documentation

## 2016-11-23 DIAGNOSIS — M549 Dorsalgia, unspecified: Secondary | ICD-10-CM

## 2016-11-23 DIAGNOSIS — L97221 Non-pressure chronic ulcer of left calf limited to breakdown of skin: Secondary | ICD-10-CM | POA: Insufficient documentation

## 2016-11-23 MED ORDER — OXYCODONE HCL 5 MG PO TABS: 5.0000 mg | ORAL_TABLET | Freq: Four times a day (QID) | ORAL | 0 refills | Status: DC

## 2016-11-23 MED ORDER — TORSEMIDE 20 MG PO TABS: 160.0000 mg | ORAL_TABLET | Freq: Two times a day (BID) | ORAL | 0 refills | Status: DC

## 2016-11-23 MED ORDER — MORPHINE SULFATE ER 20 MG PO CP24: 20.0000 mg | ORAL_CAPSULE | Freq: Two times a day (BID) | ORAL | 0 refills | Status: DC

## 2016-11-23 MED ORDER — TORSEMIDE 20 MG PO TABS: 80.0000 mg | ORAL_TABLET | Freq: Two times a day (BID) | ORAL | Status: DC

## 2016-11-23 NOTE — Discharge Instructions (Signed)
Bone Marrow Aspiration and Bone Marrow Biopsy, Adult, Care After This sheet gives you information about how to care for yourself after your procedure. Your health care provider may also give you more specific instructions. If you have problems or questions, contact your health care provider. What can I expect after the procedure? After the procedure, it is common to have:  Mild pain and tenderness.  Swelling.  Bruising. Follow these instructions at home:  Take over-the-counter or prescription medicines only as told by your health care provider.  Do not take baths, swim, or use a hot tub until your health care provider approves. Ask if you can take a shower or have a sponge bath.  Follow instructions from your health care provider about how to take care of the puncture site. Make sure you:  Wash your hands with soap and water before you change your bandage (dressing). If soap and water are not available, use hand sanitizer.  Change your dressing as told by your health care provider.  Check your puncture siteevery day for signs of infection. Check for:  More redness, swelling, or pain.  More fluid or blood.  Warmth.  Pus or a bad smell.  Return to your normal activities as told by your health care provider. Ask your health care provider what activities are safe for you.  Do not drive for 24 hours if you were given a medicine to help you relax (sedative).  Keep all follow-up visits as told by your health care provider. This is important. Contact a health care provider if:  You have more redness, swelling, or pain around the puncture site.  You have more fluid or blood coming from the puncture site.  Your puncture site feels warm to the touch.  You have pus or a bad smell coming from the puncture site.  You have a fever.  Your pain is not controlled with medicine. This information is not intended to replace advice given to you by your health care provider. Make sure you  discuss any questions you have with your health care provider. Document Released: 03/23/2005 Document Revised: 03/23/2016 Document Reviewed: 02/15/2016 Elsevier Interactive Patient Education  2017 [employer].

## 2016-11-23 NOTE — Progress Notes (Signed)
Stopped social work in the hallway to inform of discharge orders. Social work at bedside with pt now  [employer]

## 2016-11-23 NOTE — Discharge Summary (Addendum)
Physician Discharge Summary  [name] [name] EZM:[id number] DOB: 04/05/1931 DOA: 11/17/2016  PCP: Mathews Argyle, MD  Admit date: 11/17/2016 Discharge date: 11/23/2016  Admitted From: SNF Discharge disposition: SNF   Recommendations for Outpatient Follow-Up:   1. F/U Dr. Marin Olp for bone marrow biopsy results. 2. Change Unna boots weekly.   Discharge Diagnosis:   Principal Problem:    Acute on chronic diastolic CHF (congestive heart failure) (HCC) Active Problems:    HTN (hypertension)    Chronic back pain    Thrombocytopenia (HCC)    CAD (coronary artery disease) of artery bypass graft    Atrial fibrillation (HCC)    Acute on chronic respiratory failure with hypoxia (HCC)    Chronic ITP (idiopathic thrombocytopenia) (HCC)    Goals of care, counseling/discussion    Palliative care by specialist    Stage III chronic kidney disease    Obesity (BMI 30.0-34.9)    Venous stasis ulcer (HCC)    Venous stasis dermatitis of both lower extremities    MRSA carrier    Severe pulmonary hypertension secondary to lung diseases and hypoxia  Discharge Condition: Improved.  Diet recommendation: Low sodium, heart healthy.    History of Present Illness:   [name] [name] is an 83 y.o. female with a PMH of chronic respiratory failure on 3 L of home oxygen, atrial fibrillation (not on blood thinners secondary to chronic thrombocytopenia), chronic diastolic CHF (EF 50-35 percent on Echo done 12/17), chronic venous stasis of the lower extremities, CAD, hypertension and chronic back pain who was admitted 11/17/16 for evaluation of shortness of breath. BNP was 1492 on admission with chest x-ray showing cardiomegaly and chronic pulmonary venous congestion.   Hospital Course by Problem:   Principal Problem:   Acute on chronic respiratory failure with hypoxia secondary to Acute on chronic diastolic CHF (congestive heart failure) (HCC) in the setting of severe pulmonary  hypertension secondary to lung diseases and hypoxia 2-D echo repeated which continues to show a normal EF. Troponins negative. Cardiology consulted. Seen by palliative care team on 11/20/16 with goals of care established. Continue current scope of treatment. Patient is a DO NOT RESUSCITATE. I/O - 2.8 L on increased dose Lasix and Demadex and Zaroxolyn. Weight now down 7 pounds. D/C on Demedex.   Active Problems:   HTN (hypertension) Continue Cardizem and metoprolol.    Chronic back pain Continue MS Contin and Neurontin. Continue oxycodone as needed for breakthrough pain.     CAD (coronary artery disease) of artery bypass graft Continue beta blocker.    Atrial fibrillation ([place]) Rate controlled. Continue Cardizem, digoxin and metoprolol. Not a candidate for blood thinners due to ITP and chronic thrombocytopenia.    Chronic ITP (idiopathic thrombocytopenia) (HCC) Status post bone marrow biopsy 11/22/16. Cleared for discharge by Dr. Marin Olp.    Goals of care, counseling/discussion/Palliative care by specialist Being followed by the palliative care team. Full scope of treatment but DO NOT RESUSCITATE.    Stage III chronic kidney disease Creatinine stable with diuresis.    Obesity (BMI 30.0-34.9) Body mass index is 33.85 kg/m.     Chronic venous stasis dermatitis/venous stasis ulcers Continue Unna boots. Wounds as documented by wound care nurse.    MRSA carrier Contact precautions, decontamination therapy ordered.    Medical Consultants:    Hematology/oncology  Cardiology  Palliative Care  Interventional Radiology   Discharge Exam:   Vitals:   11/22/16 2134 11/23/16 0552  BP: 136/68 114/62  Pulse: 78 66  Resp: 18  18  Temp: 97.9 F (36.6 C) 98.1 F (36.7 C)   Vitals:   11/22/16 1400 11/22/16 1735 11/22/16 2134 11/23/16 0552  BP: 105/66 (!) 137/58 136/68 114/62  Pulse: 76 77 78 66  Resp: _0 Temp:   97.9 F (36.6 C) 98.1 F (36.7 C)  TempSrc:    Oral Oral  SpO2: 93% 94% 95% 95%  Weight:    89.4 kg (197 lb 3.2 oz)  Height:        General exam: Appears calm and comfortable, weak. Respiratory system: Diminished breath sounds. Respiratory effort normal. Cardiovascular system: HSIR. No JVD,  rubs, gallops or clicks. No murmurs. Gastrointestinal system: Abdomen is nondistended, soft and nontender. No organomegaly or masses felt. Normal bowel sounds heard. Central nervous system: Alert and oriented. No focal neurological deficits. Extremities: No clubbing,  or cyanosis. Unna boots on. Skin: No rashes, lesions or ulcers. Venous stasis changes LEs. Psychiatry: Judgement and insight appear normal. Mood & affect appropriate.    The results of significant diagnostics from this hospitalization (including imaging, microbiology, ancillary and laboratory) are listed below for reference.     Procedures and Diagnostic Studies:   Dg Chest 2 View  Result Date: 11/17/2016 CLINICAL DATA:  Fluid retention EXAM: CHEST  2 VIEW COMPARISON:  09/13/2016 FINDINGS: Chronic cardiomegaly and vascular pedicle widening. Diffuse interstitial opacity that is similar to multiple priors. There is low volumes with interstitial crowding. No air bronchogram, effusion or pneumothorax. Severe glenohumeral osteoarthritis. Spinal fusion and dorsal column stimulator. IMPRESSION: Cardiomegaly and chronic pulmonary venous congestion. When accounting for chronic interstitial markings, no definitive edema. Electronically Signed   By: Monte Fantasia M.D.   On: 11/17/2016 16:00     Labs:   Basic Metabolic Panel:  Recent Labs Lab 11/18/16 0328 11/19/16 0446 11/20/16 0429 11/21/16 0552 11/22/16 0439  NA 139 141 139 142 140  K 4.4 4.2 4.4 4.1 4.6  CL 99* 101 97* 99* 98*  CO2 32 35* 33* 36* 34*  GLUCOSE 122* 104* 103* 96 109*  BUN 41* 43* 43* 45* 45*  CREATININE 1.32* 1.20* 1.10* 1.09* 1.15*  CALCIUM 8.6* 8.6* 8.7* 8.8* 8.8*   GFR Estimated Creatinine Clearance:  40.1 mL/min (by C-G formula based on SCr of 1.15 mg/dL (H)). Liver Function Tests: No results for input(s): AST, ALT, ALKPHOS, BILITOT, PROT, ALBUMIN in the last 168 hours. No results for input(s): LIPASE, AMYLASE in the last 168 hours. No results for input(s): AMMONIA in the last 168 hours. Coagulation profile  Recent Labs Lab 11/21/16 0831  INR 1.22    CBC:  Recent Labs Lab 11/17/16 2148 11/20/16 0429 11/21/16 0552 11/21/16 0727 11/22/16 0439  WBC 10.0 7.9 6.9 [id number] 8.1  NEUTROABS  --   --  5.1  --   --   HGB 10.6* 10.3* 10.0* [id number] 10.5*  HCT 38.5 37.8 37.2 [id number] 38.6  MCV 88.5 88.7 88.6 [id number] 88.5  PLT PLATELET CLUMPS NOTED ON SMEAR, COUNT APPEARS DECREASED 13* 20* [id number] 33*   Cardiac Enzymes:  Recent Labs Lab 11/17/16 2148 11/18/16 0328 11/18/16 0849  TROPONINI <0.03 <0.03 <0.03   BNP: Invalid input(s): POCBNP CBG:  Recent Labs Lab 11/22/16 0801 11/22/16 1145  GLUCAP 107* 129*   D-Dimer No results for input(s): DDIMER in the last 72 hours. Hgb A1c No results for input(s): HGBA1C in the last 72 hours. Lipid Profile No results for input(s): CHOL, HDL, LDLCALC, TRIG, CHOLHDL, LDLDIRECT in the last 72 hours. Thyroid function studies No  results for input(s): TSH, T4TOTAL, T3FREE, THYROIDAB in the last 72 hours.  Invalid input(s): FREET3 Anemia work up No results for input(s): VITAMINB12, FOLATE, FERRITIN, TIBC, IRON, RETICCTPCT in the last 72 hours. Microbiology Recent Results (from the past 240 hour(s))  MRSA PCR Screening     Status: Abnormal   Collection Time: 11/17/16 11:31 PM  Result Value Ref Range Status   MRSA by PCR POSITIVE (A) NEGATIVE Final    Comment:        The GeneXpert MRSA Assay (FDA approved for NASAL specimens only), is one component of a comprehensive MRSA colonization surveillance program. It is not intended to diagnose MRSA infection nor to guide or monitor treatment for MRSA infections. RESULT  CALLED TO, READ BACK BY AND VERIFIED WITH: DODOO,E RN 0222 11/18/16 MITCHELL,L      Discharge Instructions:   Discharge Instructions    (HEART FAILURE PATIENTS) Call MD:  Anytime you have any of the following symptoms: 1) 3 pound weight gain in 24 hours or 5 pounds in 1 week 2) shortness of breath, with or without a dry hacking cough 3) swelling in the hands, feet or stomach 4) if you have to sleep on extra pillows at night in order to breathe.    Complete by:  As directed    Call MD for:  extreme fatigue    Complete by:  As directed    Call MD for:  persistant dizziness or light-headedness    Complete by:  As directed    Call MD for:  persistant nausea and vomiting    Complete by:  As directed    Call MD for:  severe uncontrolled pain    Complete by:  As directed    Call MD for:  temperature >100.4    Complete by:  As directed    Diet - low sodium heart healthy    Complete by:  As directed    Increase activity slowly    Complete by:  As directed      Allergies as of 11/23/2016      Reactions   Keflex [cephalexin] Other (See Comments)   intolerance      Medication List    STOP taking these medications   clopidogrel 75 MG tablet Commonly known as:  PLAVIX   predniSONE 10 MG tablet Commonly known as:  DELTASONE     TAKE these medications   ACIDOPHILUS/CITRUS PECTIN Tabs Take 1 tablet by mouth daily.   albuterol (2.5 MG/3ML) 0.083% nebulizer solution Commonly known as:  PROVENTIL Take 2.5 mg by nebulization every 6 (six) hours as needed for wheezing or shortness of breath.   CALCARB 600/D PO Take 2 tablets by mouth daily.   Digoxin 62.5 MCG Tabs Take 0.0625 mg by mouth daily.   diltiazem 240 MG 24 hr capsule Commonly known as:  CARDIZEM CD Take 1 capsule (240 mg total) by mouth daily.   famciclovir 250 MG tablet Commonly known as:  FAMVIR Take 1 tablet (250 mg total) by mouth daily.   feeding supplement (PRO-STAT SUGAR FREE 64) Liqd Take 30 mLs by mouth 2  (two) times daily.   gabapentin 600 MG tablet Commonly known as:  NEURONTIN Take 1 tablet (600 mg total) by mouth 2 (two) times daily.   metoprolol 100 MG tablet Commonly known as:  LOPRESSOR Take 1 tablet (100 mg total) by mouth 2 (two) times daily.   morphine 20 MG 24 hr capsule Commonly known as:  KADIAN Take 1 capsule (20 mg  total) by mouth 2 (two) times daily.   oxyCODONE 5 MG immediate release tablet Commonly known as:  Oxy IR/ROXICODONE Take 1 tablet (5 mg total) by mouth 4 (four) times daily.   pantoprazole 40 MG tablet Commonly known as:  PROTONIX Take 1 tablet (40 mg total) by mouth daily.   potassium chloride SA 20 MEQ tablet Commonly known as:  K-DUR,KLOR-CON Take 1 tablet (20 mEq total) by mouth 2 (two) times daily.   sodium chloride 0.65 % Soln nasal spray Commonly known as:  OCEAN Place 1 spray into both nostrils 3 (three) times daily.   SYSTANE OP Place 1 drop into both eyes 2 (two) times daily.   torsemide 20 MG tablet Commonly known as:  DEMADEX Take 4 tablets (80 mg total) by mouth 2 (two) times daily. What changed:  how much to take      [place], MD. Schedule an appointment as soon as possible for a visit in 1 week(s).   Specialty:  Internal Medicine Why:  Hospital follow up. Contact information: [address] [place] [address] [phone]        Volanda Napoleon, MD Follow up in 1 week(s).   Specialty:  Oncology Why:  Bone marrow biopsy results. Contact information: [place] [address] [phone]            Time coordinating discharge: 35 minutes.  Signed:  Larna Capelle  Pager [phone] [facility] 11/23/2016, 9:43 AM

## 2016-11-23 NOTE — Progress Notes (Signed)
PT Cancellation Note  Patient Details Name: [name] [name] MRN: [id number] DOB: [birth year]   Cancelled Treatment:    Reason Eval/Treat Not Completed: Patient declined, no reason specified.  Patient reports she does not want a PT evaluation here (in hosp).  Wants to wait until returns to [facility].   Despina Pole 11/23/2016, 12:02 PM Carita Pian Sanjuana Kava, [place] Pager [phone]

## 2016-11-23 NOTE — Progress Notes (Signed)
Report given to [place] at [facility]. RN aware of unna boot dressings to be changed Monday and Thursday. Pt IV discontinued, catheter intact and pt telemetry has been removed. Pt has all belongings packed. Prescriptions signed, DNR form and AVS together for PTAR. Transportation set for 1300.  Charliegh Vasudevan

## 2016-11-23 NOTE — Progress Notes (Signed)
[name] [name] is doing okay this morning. She did have a bone marrow test yesterday. This went well. The results probably will be out on Monday.  Her platelet count is trending up forward. Yesterday, the platelet count was 33,000.  It is possible that she does have ITP. When she had her episode of heart failure, possibly, because of decreased blood perfusion to the bone marrow, she had a drop in her platelet count that maybe was a little more exaggerated.  She's had no bleeding.  Hopefully, she will be able to go home today or possibly over the weekend. From a hematologic point of view, I don't think she has to stay in the hospital. We can always follow-up with her as an outpatient when the bone marrow report comes back.  I'm glad to see that her platelet count is getting back to where it typically sits.  Again, we can follow up with her as an outpatient once the bone marrow report comes back.   Lattie Haw, MD  [place] 5:3-5

## 2016-11-23 NOTE — Clinical Social Work Note (Signed)
CSW facilitated patient discharge including contacting patient family (Patient said she would call her son) and facility to confirm patient discharge plans. Clinical information faxed to facility and family agreeable with plan. CSW arranged ambulance transport via PTAR to [facility] at 1:00. RN to call report prior to discharge ([phone]).  CSW will sign off for now as social work intervention is no longer needed. Please consult [place] again if new needs arise.  Dayton Scrape, [place]

## 2016-11-23 NOTE — Clinical Social Work Note (Signed)
Clinical Social Work Assessment  Patient Details  Name: [name] [name] MRN: [id number] Date of Birth: Oct 15, 1930  Date of referral:  11/23/16               Reason for consult:  Discharge Planning                Permission sought to share information with:  Facility Art therapist granted to share information::     Name::        Agency::  [facility]  Relationship::     Contact Information:     Housing/Transportation Living arrangements for the past 2 months:  [place] of Information:  Patient, Scientist, water quality, Facility Patient Interpreter Needed:  None Criminal Activity/Legal Involvement Pertinent to Current Situation/Hospitalization:  No - Comment as needed Significant Relationships:  Adult Children Lives with:  Facility Resident Do you feel safe going back to the place where you live?  Yes Need for family participation in patient care:  Yes (Comment)  Care giving concerns:  Patient is a long-term resident from [place].   Social Worker assessment / plan:  CSW met with patient. No supports at bedside. CSW introduced role and explained that discharge planning would be discussed. Patient agreeable to return to [facility]. She has lived there for 15 years. Patient states that she will need PTAR and she will contact her son to let him know plan for discharge today. Facility admissions coordinator aware and is reviewing documentation. No further concerns. CSW encouraged patient to contact CSW as needed. CSW will continue to follow patient for support and facilitate discharge to [facility] today.  Employment status:  Retired Forensic scientist:  [employer] PT Recommendations:  [place], No Follow Up Information / Referral to community resources:  [place]  Patient/Family's Response to care:  Patient agreeable to return to [employer]. Patient's son supportive and involved in patient's care. Patient appreciated  social work intervention.  Patient/Family's Understanding of and Emotional Response to Diagnosis, Current Treatment, and Prognosis:  Patient appears to have a good understanding of the reason for admission. Patient appears happy with hospital care but is eager to return home.  Emotional Assessment Appearance:  Appears stated age Attitude/Demeanor/Rapport:  Other (Pleasant) Affect (typically observed):  Accepting, Appropriate, Calm, Pleasant Orientation:  Oriented to Self, Oriented to Place, Oriented to  Time, Oriented to Situation Alcohol / Substance use:  Never Used Psych involvement (Current and /or in the community):  No (Comment)  Discharge Needs  Concerns to be addressed:  Care Coordination Readmission within the last 30 days:  No Current discharge risk:  Dependent with Mobility Barriers to Discharge:  No Barriers Identified   Candie Chroman, LCSW 11/23/2016, 11:15 AM

## 2016-11-23 NOTE — Progress Notes (Signed)
Called [facility] and attempted to give report to [place] from admissions. Will try again later.   Oluwatoni Rotunno Leory Plowman

## 2016-11-23 NOTE — Progress Notes (Signed)
PTAR at bedside. Aware of pt lower leg wounds and oxygen dependednt. Pt IV discontinued catheter intact and telemetry removed. PTAR transporter has DNR forms, AVS, and printed prescriptions. Pt has all belongings.  Bonni Neuser Leory Plowman

## 2016-11-23 NOTE — NC FL2 (Signed)
[place] MEDICAID FL2 LEVEL OF CARE SCREENING TOOL     IDENTIFICATION  Patient Name: SERIAH BROTZMAN Birthdate: 03/24/1931 Sex: female Admission Date (Current Location): 11/17/2016  [facility] and [place] Number:  Herbalist and Address:  [facility] [place], [place] [address] [place], [place] [address]      Provider Number: [id number]  Attending Physician Name and Address:  Venetia Maxon Rama, MD  Relative Name and Phone Number:       Current Level of Care: Hospital Recommended Level of Care: [place] Prior Approval Number:    Date Approved/Denied:   PASRR Number: [id number]  Discharge Plan: SNF    Current Diagnoses: Patient Active Problem List   Diagnosis Date Noted  . Stage III chronic kidney disease 11/21/2016  . Obesity (BMI 30.0-34.9) 11/21/2016  . Venous stasis ulcer ([place]) 11/21/2016  . Venous stasis dermatitis of both lower extremities 11/21/2016  . MRSA carrier 11/21/2016  . Acute on chronic diastolic CHF (congestive heart failure) ([place]) 11/20/2016  . Goals of care, counseling/discussion   . Palliative care by specialist   . Chronic ITP (idiopathic thrombocytopenia) ([place]) 10/01/2016  . Gangrene ([place])   . Acute on chronic respiratory failure with hypoxia ([place])   . Coronary atherosclerosis of native coronary artery 10/09/2013  . Atrial fibrillation ([place]) 10/09/2013  . Leukocytosis 08/11/2013  . Chronic narcotic dependence ([place]) 07/16/2013  . Varicose veins of lower extremities with other complications 88/41/6606  . Edema 05/14/2013  . Hypokalemia 12/20/2012  . CAD (coronary artery disease) of artery bypass graft 12/18/2012  . Unstable angina ([place]) 10/18/2012  . Thrombocytopenia ([place]) 10/18/2012  . HTN (hypertension) 10/17/2012  . Chronic back pain 10/17/2012  . Precordial pain 10/17/2012    Orientation RESPIRATION BLADDER Height & Weight     Self, Time, Situation, Place  O2 (Nasal Canula 5 L) Continent Weight:  197 lb 3.2 oz (89.4 kg) Height:  _0  (162.6 cm)  BEHAVIORAL SYMPTOMS/MOOD NEUROLOGICAL BOWEL NUTRITION STATUS   (None)  (None) Continent Diet (Regular)  AMBULATORY STATUS COMMUNICATION OF NEEDS Skin   Extensive Assist Verbally Other (Comment), Surgical wounds (Unna boots venous stasis ulcers: Change on Mondays and Thursdays)                       Personal Care Assistance Level of Assistance              Functional Limitations Info  Sight, Hearing, Speech Sight Info: Adequate Hearing Info: Adequate Speech Info: Adequate    SPECIAL CARE FACTORS FREQUENCY  Blood pressure                    Contractures Contractures Info: Not present    Additional Factors Info  Code Status, Allergies, Isolation Precautions Code Status Info: DNR Allergies Info: Keflex (Cephalexin)     Isolation Precautions Info: Contact: MRSA     Current Medications (11/23/2016):  This is the current hospital active medication list Current Facility-Administered Medications  Medication Dose Route Frequency Provider Last Rate Last Dose  . 0.9 %  sodium chloride infusion  250 mL Intravenous PRN Velvet Bathe, MD      . acetaminophen (TYLENOL) tablet 650 mg  650 mg Oral Q4H PRN Velvet Bathe, MD      . acidophilus (RISAQUAD) capsule 1 capsule  1 capsule Oral Daily Velvet Bathe, MD   1 capsule at 11/23/16 0956  . albuterol (PROVENTIL) (2.5 MG/3ML) 0.083% nebulizer solution 2.5 mg  2.5 mg Nebulization Q6H PRN Velvet Bathe, MD      . digoxin (LANOXIN) tablet 0.0625 mg  0.0625 mg Oral Daily Velvet Bathe, MD   0.0625 mg at 11/23/16 0956  . diltiazem (CARDIZEM CD) 24 hr capsule 240 mg  240 mg Oral Daily Velvet Bathe, MD   240 mg at 11/23/16 0956  . feeding supplement (PRO-STAT SUGAR FREE 64) liquid 30 mL  30 mL Oral BID Velvet Bathe, MD   30 mL at 11/23/16 0955  . gabapentin (NEURONTIN) capsule 600 mg  600 mg Oral BID Velvet Bathe, MD   600 mg at 11/23/16 0956  . metoprolol (LOPRESSOR) tablet 100 mg  100 mg  Oral BID Velvet Bathe, MD   100 mg at 11/23/16 0956  . morphine (MS CONTIN) 12 hr tablet 15 mg  15 mg Oral Q8H Lily Kocher, MD   15 mg at 11/23/16 0551  . ondansetron (ZOFRAN) injection 4 mg  4 mg Intravenous Q6H PRN Velvet Bathe, MD      . oxyCODONE (Oxy IR/ROXICODONE) immediate release tablet 5 mg  5 mg Oral Q6H PRN Velvet Bathe, MD   5 mg at 11/23/16 0236  . pantoprazole (PROTONIX) EC tablet 40 mg  40 mg Oral Daily Velvet Bathe, MD   40 mg at 11/23/16 0956  . polyvinyl alcohol (LIQUIFILM TEARS) 1.4 % ophthalmic solution 2 drop  2 drop Both Eyes BID Robbie Lis, MD   2 drop at 11/23/16 0956  . potassium chloride SA (K-DUR,KLOR-CON) CR tablet 20 mEq  20 mEq Oral BID Velvet Bathe, MD   20 mEq at 11/23/16 0956  . sodium chloride (OCEAN) 0.65 % nasal spray 1 spray  1 spray Each Nare TID Velvet Bathe, MD   1 spray at 11/23/16 0956  . sodium chloride flush (NS) 0.9 % injection 3 mL  3 mL Intravenous Q12H Velvet Bathe, MD   3 mL at 11/23/16 0957  . sodium chloride flush (NS) 0.9 % injection 3 mL  3 mL Intravenous PRN Velvet Bathe, MD      . torsemide (DEMADEX) tablet 80 mg  80 mg Oral BID Sherren Mocha, MD   80 mg at 11/23/16 0811  . valACYclovir (VALTREX) tablet 500 mg  500 mg Oral Daily Velvet Bathe, MD   500 mg at 11/23/16 0051     Discharge Medications: Please see discharge summary for a list of discharge medications.  Relevant Imaging Results:  Relevant Lab Results:   Additional Information SS#: [id number]  Candie Chroman, LCSW

## 2016-11-27 ENCOUNTER — Other Ambulatory Visit (HOSPITAL_BASED_OUTPATIENT_CLINIC_OR_DEPARTMENT_OTHER): Payer: Medicare Other

## 2016-11-27 ENCOUNTER — Encounter: Payer: Self-pay | Admitting: *Deleted

## 2016-11-27 ENCOUNTER — Ambulatory Visit (HOSPITAL_BASED_OUTPATIENT_CLINIC_OR_DEPARTMENT_OTHER): Payer: Medicare Other | Admitting: Family

## 2016-11-27 ENCOUNTER — Ambulatory Visit: Payer: Medicare Other

## 2016-11-27 VITALS — BP 109/42 | HR 67 | Temp 98.2°F | Resp 18

## 2016-11-27 DIAGNOSIS — I872 Venous insufficiency (chronic) (peripheral): Secondary | ICD-10-CM

## 2016-11-27 DIAGNOSIS — D696 Thrombocytopenia, unspecified: Secondary | ICD-10-CM

## 2016-11-27 DIAGNOSIS — I739 Peripheral vascular disease, unspecified: Secondary | ICD-10-CM

## 2016-11-27 DIAGNOSIS — D693 Immune thrombocytopenic purpura: Secondary | ICD-10-CM

## 2016-11-27 LAB — COMPREHENSIVE METABOLIC PANEL
ALT: 6 U/L (ref 0–55)
AST: 14 U/L (ref 5–34)
Albumin: 3.4 g/dL — ABNORMAL LOW (ref 3.5–5.0)
Alkaline Phosphatase: 69 U/L (ref 40–150)
Anion Gap: 14 mEq/L — ABNORMAL HIGH (ref 3–11)
BILIRUBIN TOTAL: 1.26 mg/dL — AB (ref 0.20–1.20)
BUN: 81.5 mg/dL — AB (ref 7.0–26.0)
CALCIUM: 9.7 mg/dL (ref 8.4–10.4)
CHLORIDE: 87 meq/L — AB (ref 98–109)
CO2: 37 mEq/L — ABNORMAL HIGH (ref 22–29)
CREATININE: 2 mg/dL — AB (ref 0.6–1.1)
EGFR: 22 mL/min/{1.73_m2} — ABNORMAL LOW (ref >90)
Glucose: 215 mg/dl — ABNORMAL HIGH (ref 70–140)
Potassium: 4.3 mEq/L (ref 3.5–5.1)
Sodium: 138 mEq/L (ref 136–145)
TOTAL PROTEIN: 7.3 g/dL (ref 6.4–8.3)

## 2016-11-27 LAB — CBC WITH DIFFERENTIAL (CANCER CENTER ONLY)
BASO#: 0 10*3/uL (ref 0.0–0.2)
BASO%: 0.4 % (ref 0.0–2.0)
EOS%: 1.3 % (ref 0.0–7.0)
Eosinophils Absolute: 0.1 10*3/uL (ref 0.0–0.5)
HEMATOCRIT: 42.2 % (ref 34.8–46.6)
HEMOGLOBIN: 12.2 g/dL (ref 11.6–15.9)
LYMPH#: 0.8 10*3/uL — AB (ref 0.9–3.3)
LYMPH%: 11.6 % — ABNORMAL LOW (ref 14.0–48.0)
MCH: 24.8 pg — ABNORMAL LOW (ref 26.0–34.0)
MCHC: 28.9 g/dL — ABNORMAL LOW (ref 32.0–36.0)
MCV: 86 fL (ref 81–101)
MONO#: 0.5 10*3/uL (ref 0.1–0.9)
MONO%: 6.8 % (ref 0.0–13.0)
NEUT%: 79.9 % (ref 39.6–80.0)
NEUTROS ABS: 5.7 10*3/uL (ref 1.5–6.5)
Platelets: 18 10*3/uL — ABNORMAL LOW (ref 145–400)
RBC: 4.91 10*6/uL (ref 3.70–5.32)
RDW: 18.8 % — AB (ref 11.1–15.7)
WBC: 7.1 10*3/uL (ref 3.9–10.0)

## 2016-11-27 MED ORDER — PREDNISONE 20 MG PO TABS: 60.0000 mg | ORAL_TABLET | Freq: Every day | ORAL | 0 refills | Status: DC

## 2016-11-27 NOTE — Progress Notes (Addendum)
Hematology and Oncology Follow Up Visit  [name] [id number] [birth year] 83 y.o. 11/27/2016   Principle Diagnosis:  ITP - relapsed  Current Therapy:   Nplate - dosage adjusted as indicated    Interim History:  [name] [name] is here alone today for follow-up. She is doing fairly well. She has developed a petechiae rash and some bruising on her arms and shoulders. She denies having any episodes of bleeding. Platelet count is down to 18. Hgb is table at 12.2.  Bone marrow biopsy results showed hypercellular bone marrow with erythroid and megakaryocytic changes.  She is not responding to Nplate and has already failed treatment with prednisone. We will try her on IVIG. This will be low dose and infused over 6 hours.  No lymphadenopathy found on exam.  No fever, chills, n/v, rash, dizziness, chest pain, palpitations, abdominal pain or changes in bowel or bladder habits.  SOB is unchanged from baseline. She is on 2-5L [place] supplemental O2 as needed.  She has PVD with severe venous insufficiency and swelling in both legs. Both lower extremities are now open to air and she has one band aid on her left shin. She appears to have healed nicely but has hyperpigmentation of both lower extremities secondary to the venous insufficiency. She is still followed by wound care.    She states that she has maintained a good appetite and is staying well hydrated. She states that she is unable to stand to be weighed.   Medications:  Allergies as of 11/27/2016      Reactions   Keflex [cephalexin] Other (See Comments)   intolerance      Medication List       Accurate as of 11/27/16 11:14 AM. Always use your most recent med list.          ACIDOPHILUS/CITRUS PECTIN Tabs Take 1 tablet by mouth daily.   albuterol (2.5 MG/3ML) 0.083% nebulizer solution Commonly known as:  PROVENTIL Take 2.5 mg by nebulization every 6 (six) hours as needed for wheezing or shortness of breath.   CALCARB 600/D PO Take 2  tablets by mouth daily.   Digoxin 62.5 MCG Tabs Take 0.0625 mg by mouth daily.   diltiazem 240 MG 24 hr capsule Commonly known as:  CARDIZEM CD Take 1 capsule (240 mg total) by mouth daily.   famciclovir 250 MG tablet Commonly known as:  FAMVIR Take 1 tablet (250 mg total) by mouth daily.   feeding supplement (PRO-STAT SUGAR FREE 64) Liqd Take 30 mLs by mouth 2 (two) times daily.   gabapentin 600 MG tablet Commonly known as:  NEURONTIN Take 1 tablet (600 mg total) by mouth 2 (two) times daily.   metoprolol 100 MG tablet Commonly known as:  LOPRESSOR Take 1 tablet (100 mg total) by mouth 2 (two) times daily.   morphine 20 MG 24 hr capsule Commonly known as:  KADIAN Take 1 capsule (20 mg total) by mouth 2 (two) times daily.   oxyCODONE 5 MG immediate release tablet Commonly known as:  Oxy IR/ROXICODONE Take 1 tablet (5 mg total) by mouth 4 (four) times daily.   pantoprazole 40 MG tablet Commonly known as:  PROTONIX Take 1 tablet (40 mg total) by mouth daily.   potassium chloride SA 20 MEQ tablet Commonly known as:  K-DUR,KLOR-CON Take 1 tablet (20 mEq total) by mouth 2 (two) times daily.   sodium chloride 0.65 % Soln nasal spray Commonly known as:  OCEAN Place 1 spray into both nostrils 3 (three)  times daily.   SYSTANE OP Place 1 drop into both eyes 2 (two) times daily.   torsemide 20 MG tablet Commonly known as:  DEMADEX Take 4 tablets (80 mg total) by mouth 2 (two) times daily.       Allergies:  Allergies  Allergen Reactions  . Keflex [Cephalexin] Other (See Comments)    intolerance    Past Medical History, Surgical history, Social history, and Family History were reviewed and updated.  Review of Systems: All other 10 point review of systems is negative.   Physical Exam:  vitals were not taken for this visit.  Wt Readings from Last 3 Encounters:  11/23/16 197 lb 3.2 oz (89.4 kg)  11/02/16 197 lb 12.8 oz (89.7 kg)  10/01/16 195 lb (88.5 kg)     Ocular: Sclerae unicteric, pupils equal, round and reactive to light Ear-nose-throat: Oropharynx clear, dentition fair Lymphatic: No cervical supraclavicular or axillary adenopathy Lungs no rales or rhonchi, good excursion bilaterally Heart regular rate and rhythm, no murmur appreciated Abd soft, nontender, positive bowel sounds, no liver or spleen tip palpated on exam, no fluid wave  MSK no focal spinal tenderness, no joint edema Neuro: non-focal, well-oriented, appropriate affect Breasts: Deferred  Lab Results  Component Value Date   WBC 8.1 11/22/2016   HGB 10.5 (L) 11/22/2016   HCT 38.6 11/22/2016   MCV 88.5 11/22/2016   PLT 33 (L) 11/22/2016   No results found for: FERRITIN, IRON, TIBC, UIBC, IRONPCTSAT Lab Results  Component Value Date   RBC 4.36 11/22/2016   No results found for: KPAFRELGTCHN, LAMBDASER, KAPLAMBRATIO No results found for: IGGSERUM, IGA, IGMSERUM No results found for: Kathrynn Ducking, MSPIKE, SPEI   Chemistry      Component Value Date/Time   NA 140 11/22/2016 0439   NA 140 11/14/2016 1020   K 4.6 11/22/2016 0439   K 4.3 11/14/2016 1020   CL 98 (L) 11/22/2016 0439   CL 99 11/02/2016 1354   CL 98 10/15/2016 1121   CO2 34 (H) 11/22/2016 0439   CO2 32 (H) 11/14/2016 1020   BUN 45 (H) 11/22/2016 0439   BUN 38.9 (H) 11/14/2016 1020   CREATININE 1.15 (H) 11/22/2016 0439   CREATININE 1.2 (H) 11/14/2016 1020      Component Value Date/Time   CALCIUM 8.8 (L) 11/22/2016 0439   CALCIUM 9.3 11/14/2016 1020   ALKPHOS 74 11/14/2016 1020   AST 12 11/14/2016 1020   ALT <6 11/14/2016 1020   BILITOT 1.23 (H) 11/14/2016 1020     Impression and Plan: [name] [name] is a very pleasant 83 yo female with history of ITP. She completed treatment with prednisone and has now relapsed. She has not responded to prednisone or Nplate. Platelet count is 18 and she has a petechial rash and bruising of the arms and shoulders. She  denies having any episodes of bleeding and Hgb is stable at 12.2.  We will now get her on IVIG at this time. She will receive low dose 2 days in a row once PA is complete. For now, we will have her start prednisone 60 mg PO daily and look at tapering off at follow-up next week. Prescription printed and placed in her packet with labs and bone marrow biopsy results for her care givers at her assisted living facility.  We will plan to see her back in 1 week for follow-up and repeat lab work.  Her living facility will contact us with any questions or  concerns. We can certainly see her sooner if need be.   Eliezer Bottom, NP 3/13/201811:14 AM

## 2016-11-30 DIAGNOSIS — I87323 Chronic venous hypertension (idiopathic) with inflammation of bilateral lower extremity: Secondary | ICD-10-CM | POA: Diagnosis not present

## 2016-11-30 DIAGNOSIS — L97211 Non-pressure chronic ulcer of right calf limited to breakdown of skin: Secondary | ICD-10-CM | POA: Diagnosis not present

## 2016-11-30 DIAGNOSIS — L97221 Non-pressure chronic ulcer of left calf limited to breakdown of skin: Secondary | ICD-10-CM | POA: Diagnosis not present

## 2016-11-30 LAB — CHROMOSOME ANALYSIS, BONE MARROW

## 2016-12-04 ENCOUNTER — Encounter: Payer: Self-pay | Admitting: Family

## 2016-12-04 ENCOUNTER — Other Ambulatory Visit (HOSPITAL_BASED_OUTPATIENT_CLINIC_OR_DEPARTMENT_OTHER): Payer: Medicare Other

## 2016-12-04 ENCOUNTER — Other Ambulatory Visit: Payer: Self-pay | Admitting: Family

## 2016-12-04 ENCOUNTER — Ambulatory Visit (HOSPITAL_BASED_OUTPATIENT_CLINIC_OR_DEPARTMENT_OTHER): Payer: Medicare Other | Admitting: Family

## 2016-12-04 VITALS — BP 137/55 | HR 87 | Temp 97.4°F | Resp 20

## 2016-12-04 DIAGNOSIS — D693 Immune thrombocytopenic purpura: Secondary | ICD-10-CM

## 2016-12-04 DIAGNOSIS — D696 Thrombocytopenia, unspecified: Secondary | ICD-10-CM

## 2016-12-04 DIAGNOSIS — I739 Peripheral vascular disease, unspecified: Secondary | ICD-10-CM | POA: Diagnosis not present

## 2016-12-04 DIAGNOSIS — I872 Venous insufficiency (chronic) (peripheral): Secondary | ICD-10-CM | POA: Diagnosis not present

## 2016-12-04 LAB — COMPREHENSIVE METABOLIC PANEL
ALK PHOS: 59 U/L (ref 40–150)
ALT: 11 U/L (ref 0–55)
ANION GAP: 12 meq/L — AB (ref 3–11)
AST: 11 U/L (ref 5–34)
Albumin: 3.6 g/dL (ref 3.5–5.0)
BILIRUBIN TOTAL: 0.95 mg/dL (ref 0.20–1.20)
BUN: 66.8 mg/dL — ABNORMAL HIGH (ref 7.0–26.0)
CALCIUM: 9.4 mg/dL (ref 8.4–10.4)
CO2: 34 mEq/L — ABNORMAL HIGH (ref 22–29)
CREATININE: 1.5 mg/dL — AB (ref 0.6–1.1)
Chloride: 96 mEq/L — ABNORMAL LOW (ref 98–109)
EGFR: 31 mL/min/{1.73_m2} — AB (ref >90)
Glucose: 254 mg/dl — ABNORMAL HIGH (ref 70–140)
Potassium: 4.1 mEq/L (ref 3.5–5.1)
Sodium: 141 mEq/L (ref 136–145)
TOTAL PROTEIN: 7.1 g/dL (ref 6.4–8.3)

## 2016-12-04 LAB — CBC WITH DIFFERENTIAL (CANCER CENTER ONLY)
BASO#: 0 10*3/uL (ref 0.0–0.2)
BASO%: 0.2 % (ref 0.0–2.0)
EOS ABS: 0.1 10*3/uL (ref 0.0–0.5)
EOS%: 0.4 % (ref 0.0–7.0)
HEMATOCRIT: 41.2 % (ref 34.8–46.6)
HGB: 11.9 g/dL (ref 11.6–15.9)
LYMPH#: 0.6 10*3/uL — AB (ref 0.9–3.3)
LYMPH%: 3.3 % — AB (ref 14.0–48.0)
MCH: 24.3 pg — AB (ref 26.0–34.0)
MCHC: 28.9 g/dL — ABNORMAL LOW (ref 32.0–36.0)
MCV: 84 fL (ref 81–101)
MONO#: 0.5 10*3/uL (ref 0.1–0.9)
MONO%: 2.7 % (ref 0.0–13.0)
NEUT#: 15.4 10*3/uL — ABNORMAL HIGH (ref 1.5–6.5)
NEUT%: 93.4 % — AB (ref 39.6–80.0)
Platelets: 252 10*3/uL (ref 145–400)
RBC: 4.9 10*6/uL (ref 3.70–5.32)
RDW: 18.2 % — AB (ref 11.1–15.7)
WBC: 16.4 10*3/uL — ABNORMAL HIGH (ref 3.9–10.0)

## 2016-12-04 LAB — TECHNOLOGIST REVIEW CHCC SATELLITE

## 2016-12-04 MED ORDER — PREDNISONE 20 MG PO TABS: 40.0000 mg | ORAL_TABLET | Freq: Every day | ORAL | 0 refills | Status: DC

## 2016-12-04 NOTE — Progress Notes (Signed)
Hematology and Oncology Follow Up Visit  [name] [name] [id number] [birth year] 83 y.o. 12/04/2016   Principle Diagnosis:  ITP - relapsed  Current Therapy:   Prednisone 60 mg PO daily - decreased today 12/04/16 to 40 mg PO daily     Interim History:  [name] [name] is here alone today for follow-up. She is doing well and has no complaints at this time. Her platelet count is now up to 252 and her petechial rash is fading nicely. No episodes of bleeding. We will continue on prednisone and hold off on IVIG for now.   No c/o fatigue. No fever, chills, n/v, rash, dizziness, chest pain, palpitations, abdominal pain or changes in bowel or bladder habits. No lymphadenopathy found on exam.  SOB is unchanged from baseline. She is still on 2-5L [place] supplemental O2 as needed.  She has PVD with severe venous insufficiency and swelling in both legs. Both lower extremities are wrapped with ace wraps and gauze today. She is still followed by wound care.    She states that she has maintained a good appetite and is staying well hydrated. She states that she is unable to stand to be weighed.   Medications:  Allergies as of 12/04/2016      Reactions   Keflex [cephalexin] Other (See Comments)   intolerance      Medication List       Accurate as of 12/04/16 12:03 PM. Always use your most recent med list.          ACIDOPHILUS/CITRUS PECTIN Tabs Take 1 tablet by mouth daily.   albuterol (2.5 MG/3ML) 0.083% nebulizer solution Commonly known as:  PROVENTIL Take 2.5 mg by nebulization every 6 (six) hours as needed for wheezing or shortness of breath.   CALCARB 600/D PO Take 2 tablets by mouth daily.   Digoxin 62.5 MCG Tabs Take 0.0625 mg by mouth daily.   diltiazem 240 MG 24 hr capsule Commonly known as:  CARDIZEM CD Take 1 capsule (240 mg total) by mouth daily.   famciclovir 250 MG tablet Commonly known as:  FAMVIR Take 1 tablet (250 mg total) by mouth daily.   feeding supplement (PRO-STAT  SUGAR FREE 64) Liqd Take 30 mLs by mouth 2 (two) times daily.   gabapentin 600 MG tablet Commonly known as:  NEURONTIN Take 1 tablet (600 mg total) by mouth 2 (two) times daily.   metoprolol 100 MG tablet Commonly known as:  LOPRESSOR Take 1 tablet (100 mg total) by mouth 2 (two) times daily.   morphine 20 MG 24 hr capsule Commonly known as:  KADIAN Take 1 capsule (20 mg total) by mouth 2 (two) times daily.   oxyCODONE 5 MG immediate release tablet Commonly known as:  Oxy IR/ROXICODONE Take 1 tablet (5 mg total) by mouth 4 (four) times daily.   pantoprazole 40 MG tablet Commonly known as:  PROTONIX Take 1 tablet (40 mg total) by mouth daily.   potassium chloride SA 20 MEQ tablet Commonly known as:  K-DUR,KLOR-CON Take 1 tablet (20 mEq total) by mouth 2 (two) times daily.   predniSONE 20 MG tablet Commonly known as:  DELTASONE Take 2 tablets (40 mg total) by mouth daily with breakfast.   sodium chloride 0.65 % Soln nasal spray Commonly known as:  OCEAN Place 1 spray into both nostrils 3 (three) times daily.   SYSTANE OP Place 1 drop into both eyes 2 (two) times daily.   torsemide 20 MG tablet Commonly known as:  DEMADEX Take 4  tablets (80 mg total) by mouth 2 (two) times daily.       Allergies:  Allergies  Allergen Reactions  . Keflex [Cephalexin] Other (See Comments)    intolerance    Past Medical History, Surgical history, Social history, and Family History were reviewed and updated.  Review of Systems: All other 10 point review of systems is negative.   Physical Exam:  oral temperature is 97.4 F (36.3 C). Her blood pressure is 137/55 (abnormal) and her pulse is 87. Her respiration is 20 and oxygen saturation is 90%.   Wt Readings from Last 3 Encounters:  11/23/16 197 lb 3.2 oz (89.4 kg)  11/02/16 197 lb 12.8 oz (89.7 kg)  10/01/16 195 lb (88.5 kg)    Ocular: Sclerae unicteric, pupils equal, round and reactive to light Ear-nose-throat: Oropharynx  clear, dentition fair Lymphatic: No cervical supraclavicular or axillary adenopathy Lungs no rales or rhonchi, good excursion bilaterally Heart regular rate and rhythm, no murmur appreciated Abd soft, nontender, positive bowel sounds, no liver or spleen tip palpated on exam, no fluid wave  MSK no focal spinal tenderness, no joint edema Neuro: non-focal, well-oriented, appropriate affect Breasts: Deferred  Lab Results  Component Value Date   WBC 16.4 (H) 12/04/2016   HGB 11.9 12/04/2016   HCT 41.2 12/04/2016   MCV 84 12/04/2016   PLT 252 Platelet count confirmed by slide estimate 12/04/2016   No results found for: FERRITIN, IRON, TIBC, UIBC, IRONPCTSAT Lab Results  Component Value Date   RBC 4.90 12/04/2016   No results found for: KPAFRELGTCHN, LAMBDASER, KAPLAMBRATIO No results found for: IGGSERUM, IGA, IGMSERUM No results found for: Kathrynn Ducking, MSPIKE, SPEI   Chemistry      Component Value Date/Time   NA 138 11/27/2016 1056   K 4.3 11/27/2016 1056   CL 98 (L) 11/22/2016 0439   CL 99 11/02/2016 1354   CL 98 10/15/2016 1121   CO2 37 (H) 11/27/2016 1056   BUN 81.5 (H) 11/27/2016 1056   CREATININE 2.0 (H) 11/27/2016 1056      Component Value Date/Time   CALCIUM 9.7 11/27/2016 1056   ALKPHOS 69 11/27/2016 1056   AST 14 11/27/2016 1056   ALT 6 11/27/2016 1056   BILITOT 1.26 (H) 11/27/2016 1056     Impression and Plan: [name] [name] is a very pleasant 83 yo female with history of ITP. She completed treatment with prednisone and has now relapsed. She failed Nplate and is now back on Prednisone. She has had a wonderful response and platelet count is up to 252. We will continue on Prednisone but decrease her to 40 mg PO daily.  Her IVIG infusions scheduled for this week were cancelled.  CBC and prescription were sent back with patient.  We will plan to see her back in 2 week for follow-up and repeat lab work.  Her living facility  will contact us with any questions or concerns. We can certainly see her sooner if need be.   Eliezer Bottom, NP 3/20/201812:03 PM

## 2016-12-05 ENCOUNTER — Ambulatory Visit: Payer: Medicare Other

## 2016-12-06 ENCOUNTER — Ambulatory Visit: Payer: Medicare Other

## 2016-12-07 ENCOUNTER — Encounter (HOSPITAL_COMMUNITY): Payer: Self-pay

## 2016-12-10 ENCOUNTER — Encounter: Payer: Self-pay | Admitting: Hematology & Oncology

## 2016-12-14 DIAGNOSIS — I87323 Chronic venous hypertension (idiopathic) with inflammation of bilateral lower extremity: Secondary | ICD-10-CM | POA: Diagnosis not present

## 2016-12-18 ENCOUNTER — Ambulatory Visit (HOSPITAL_BASED_OUTPATIENT_CLINIC_OR_DEPARTMENT_OTHER): Payer: Medicare Other | Admitting: Family

## 2016-12-18 ENCOUNTER — Other Ambulatory Visit (HOSPITAL_BASED_OUTPATIENT_CLINIC_OR_DEPARTMENT_OTHER): Payer: Medicare Other

## 2016-12-18 DIAGNOSIS — D693 Immune thrombocytopenic purpura: Secondary | ICD-10-CM

## 2016-12-18 DIAGNOSIS — D696 Thrombocytopenia, unspecified: Secondary | ICD-10-CM

## 2016-12-18 LAB — CBC WITH DIFFERENTIAL (CANCER CENTER ONLY)
BASO#: 0 10*3/uL (ref 0.0–0.2)
BASO%: 0.2 % (ref 0.0–2.0)
EOS ABS: 0.1 10*3/uL (ref 0.0–0.5)
EOS%: 0.5 % (ref 0.0–7.0)
HEMATOCRIT: 46.9 % — AB (ref 34.8–46.6)
HEMOGLOBIN: 13.9 g/dL (ref 11.6–15.9)
LYMPH#: 2.3 10*3/uL (ref 0.9–3.3)
LYMPH%: 16.4 % (ref 14.0–48.0)
MCH: 24 pg — AB (ref 26.0–34.0)
MCHC: 29.6 g/dL — ABNORMAL LOW (ref 32.0–36.0)
MCV: 81 fL (ref 81–101)
MONO#: 0.5 10*3/uL (ref 0.1–0.9)
MONO%: 3.8 % (ref 0.0–13.0)
NEUT#: 11.1 10*3/uL — ABNORMAL HIGH (ref 1.5–6.5)
NEUT%: 79.1 % (ref 39.6–80.0)
Platelets: 35 10*3/uL — ABNORMAL LOW (ref 145–400)
RBC: 5.79 10*6/uL — AB (ref 3.70–5.32)
RDW: 19.4 % — ABNORMAL HIGH (ref 11.1–15.7)
WBC: 14 10*3/uL — ABNORMAL HIGH (ref 3.9–10.0)

## 2016-12-18 LAB — COMPREHENSIVE METABOLIC PANEL
ALBUMIN: 3.6 g/dL (ref 3.5–5.0)
ALK PHOS: 63 U/L (ref 40–150)
ALT: 13 U/L (ref 0–55)
AST: 13 U/L (ref 5–34)
Anion Gap: 15 mEq/L — ABNORMAL HIGH (ref 3–11)
BILIRUBIN TOTAL: 0.59 mg/dL (ref 0.20–1.20)
BUN: 65.5 mg/dL — AB (ref 7.0–26.0)
CALCIUM: 9.8 mg/dL (ref 8.4–10.4)
CO2: 28 mEq/L (ref 22–29)
Chloride: 95 mEq/L — ABNORMAL LOW (ref 98–109)
Creatinine: 1.6 mg/dL — ABNORMAL HIGH (ref 0.6–1.1)
EGFR: 30 mL/min/{1.73_m2} — AB (ref >90)
GLUCOSE: 213 mg/dL — AB (ref 70–140)
Potassium: 4.1 mEq/L (ref 3.5–5.1)
SODIUM: 138 meq/L (ref 136–145)
TOTAL PROTEIN: 7.2 g/dL (ref 6.4–8.3)

## 2016-12-18 MED ORDER — PREDNISONE 20 MG PO TABS: 60.0000 mg | ORAL_TABLET | Freq: Every day | ORAL | 0 refills | Status: DC

## 2016-12-18 NOTE — Progress Notes (Signed)
Hematology and Oncology Follow Up Visit  [name] [name] [id number] 07/31/1931 83 y.o. 12/18/2016   Principle Diagnosis:  ITP - relapsed  Current Therapy:   Prednisone 60 mg PO daily    Interim History:  [name] [name] is here alone today for follow-up. We had decreased her prednisone to 40 mg PO daily at her last visit. Her paperwork from her nursing home that she brought with her today reflects that she has been getting her steroid as prescribed. Platelet count today is down to 35. No anemia.  She denies any episodes of bleeding, bruising or petechiae. No lymphadenopathy found on exam.  No c/o fatigue. No fever, chills, n/v, cough, rash, dizziness, chest pain, palpitations, abdominal pain or changes in bowel or bladder habits. SOB is unchanged from baseline. She is still on 2L [place] supplemental O2 as needed.  She has PVD with severe venous insufficiency and swelling in both legs. Her right leg is still wrapped with an ace wrap and gauze. She is still seeing wound care weekly. She has a compression stocking on her left leg. Pedal pulses +1.   She has maintained a good appetite and is staying well hydrated. She maintains that she is unable to stand to be weighed.   Medications:  Allergies as of 12/18/2016      Reactions   Keflex [cephalexin] Other (See Comments)   intolerance      Medication List       Accurate as of 12/18/16  9:51 AM. Always use your most recent med list.          ACIDOPHILUS/CITRUS PECTIN Tabs Take 1 tablet by mouth daily.   albuterol (2.5 MG/3ML) 0.083% nebulizer solution Commonly known as:  PROVENTIL Take 2.5 mg by nebulization every 6 (six) hours as needed for wheezing or shortness of breath.   CALCARB 600/D PO Take 2 tablets by mouth daily.   clopidogrel 75 MG tablet Commonly known as:  PLAVIX   Digoxin 62.5 MCG Tabs Take 0.0625 mg by mouth daily.   diltiazem 240 MG 24 hr capsule Commonly known as:  CARDIZEM CD Take 1 capsule (240 mg total) by mouth  daily.   famciclovir 250 MG tablet Commonly known as:  FAMVIR Take 1 tablet (250 mg total) by mouth daily.   feeding supplement (PRO-STAT SUGAR FREE 64) Liqd Take 30 mLs by mouth 2 (two) times daily.   gabapentin 600 MG tablet Commonly known as:  NEURONTIN Take 1 tablet (600 mg total) by mouth 2 (two) times daily.   metoprolol 100 MG tablet Commonly known as:  LOPRESSOR Take 1 tablet (100 mg total) by mouth 2 (two) times daily.   morphine 20 MG 24 hr capsule Commonly known as:  KADIAN Take 1 capsule (20 mg total) by mouth 2 (two) times daily.   oxyCODONE 5 MG immediate release tablet Commonly known as:  Oxy IR/ROXICODONE Take 1 tablet (5 mg total) by mouth 4 (four) times daily.   pantoprazole 40 MG tablet Commonly known as:  PROTONIX Take 1 tablet (40 mg total) by mouth daily.   potassium chloride 10 MEQ CR capsule Commonly known as:  MICRO-K   potassium chloride SA 20 MEQ tablet Commonly known as:  K-DUR,KLOR-CON Take 1 tablet (20 mEq total) by mouth 2 (two) times daily.   predniSONE 20 MG tablet Commonly known as:  DELTASONE Take 2 tablets (40 mg total) by mouth daily with breakfast.   sodium chloride 0.65 % Soln nasal spray Commonly known as:  [employer]  1 spray into both nostrils 3 (three) times daily.   SYSTANE OP Place 1 drop into both eyes 2 (two) times daily.   torsemide 20 MG tablet Commonly known as:  DEMADEX Take 4 tablets (80 mg total) by mouth 2 (two) times daily.   triamcinolone 0.025 % cream Commonly known as:  KENALOG       Allergies:  Allergies  Allergen Reactions  . Keflex [Cephalexin] Other (See Comments)    intolerance    Past Medical History, Surgical history, Social history, and Family History were reviewed and updated.  Review of Systems: All other 10 point review of systems is negative.   Physical Exam:  oral temperature is 98.3 F (36.8 C). Her blood pressure is 124/58 (abnormal) and her pulse is 72. Her respiration is  18 and oxygen saturation is 96%.   Wt Readings from Last 3 Encounters:  11/23/16 197 lb 3.2 oz (89.4 kg)  11/02/16 197 lb 12.8 oz (89.7 kg)  10/01/16 195 lb (88.5 kg)    Ocular: Sclerae unicteric, pupils equal, round and reactive to light Ear-nose-throat: Oropharynx clear, dentition fair Lymphatic: No cervical, supraclavicular or axillary adenopathy Lungs no rales or rhonchi, good excursion bilaterally Heart regular rate and rhythm, no murmur appreciated Abd soft, nontender, positive bowel sounds, no liver or spleen tip palpated on exam, no fluid wave  MSK no focal spinal tenderness, no joint edema Neuro: non-focal, well-oriented, appropriate affect Breasts: Deferred  Lab Results  Component Value Date   WBC 14.0 (H) 12/18/2016   HGB 13.9 12/18/2016   HCT 46.9 (H) 12/18/2016   MCV 81 12/18/2016   PLT 35 Platelet count confirmed by slide estimate (L) 12/18/2016   No results found for: FERRITIN, IRON, TIBC, UIBC, IRONPCTSAT Lab Results  Component Value Date   RBC 5.79 (H) 12/18/2016   No results found for: KPAFRELGTCHN, LAMBDASER, KAPLAMBRATIO No results found for: IGGSERUM, IGA, IGMSERUM No results found for: Odetta Pink, SPEI   Chemistry      Component Value Date/Time   NA 141 12/04/2016 1107   K 4.1 12/04/2016 1107   CL 98 (L) 11/22/2016 0439   CL 99 11/02/2016 1354   CL 98 10/15/2016 1121   CO2 34 (H) 12/04/2016 1107   BUN 66.8 (H) 12/04/2016 1107   CREATININE 1.5 (H) 12/04/2016 1107      Component Value Date/Time   CALCIUM 9.4 12/04/2016 1107   ALKPHOS 59 12/04/2016 1107   AST 11 12/04/2016 1107   ALT 11 12/04/2016 1107   BILITOT 0.95 12/04/2016 1107     Impression and Plan: [name] [name] is a very pleasant 83 yo female with history of ITP. She completed treatment with prednisone and has now relapsed. She failed Nplate and is now back on Prednisone. After decreasing her Prednisone to 40 mg PO daily at her  last visit her platelet count has dropped to 35. No anemia.  We will increase her Prednisone back up to 60 mg PO daily per Dr. Marin Olp.  We will plan to see her back in 1 week for labs and follow-up to re-evaluate her progress.  Her living facility will contact us with any questions or concerns. We can certainly see her sooner if need be.   Eliezer Bottom, NP 4/3/20189:51 AM

## 2016-12-26 ENCOUNTER — Other Ambulatory Visit (HOSPITAL_BASED_OUTPATIENT_CLINIC_OR_DEPARTMENT_OTHER): Payer: Medicare Other

## 2016-12-26 ENCOUNTER — Ambulatory Visit (HOSPITAL_BASED_OUTPATIENT_CLINIC_OR_DEPARTMENT_OTHER): Payer: Medicare Other | Admitting: Family

## 2016-12-26 DIAGNOSIS — D693 Immune thrombocytopenic purpura: Secondary | ICD-10-CM | POA: Diagnosis present

## 2016-12-26 DIAGNOSIS — D696 Thrombocytopenia, unspecified: Secondary | ICD-10-CM

## 2016-12-26 LAB — CBC WITH DIFFERENTIAL (CANCER CENTER ONLY)
BASO#: 0 10*3/uL (ref 0.0–0.2)
BASO%: 0.1 % (ref 0.0–2.0)
EOS ABS: 0 10*3/uL (ref 0.0–0.5)
EOS%: 0.1 % (ref 0.0–7.0)
HCT: 44.2 % (ref 34.8–46.6)
HGB: 12.9 g/dL (ref 11.6–15.9)
LYMPH#: 0.3 10*3/uL — ABNORMAL LOW (ref 0.9–3.3)
LYMPH%: 2 % — AB (ref 14.0–48.0)
MCH: 24.1 pg — AB (ref 26.0–34.0)
MCHC: 29.2 g/dL — AB (ref 32.0–36.0)
MCV: 83 fL (ref 81–101)
MONO#: 0.2 10*3/uL (ref 0.1–0.9)
MONO%: 1.1 % (ref 0.0–13.0)
NEUT#: 14.5 10*3/uL — ABNORMAL HIGH (ref 1.5–6.5)
NEUT%: 96.7 % — ABNORMAL HIGH (ref 39.6–80.0)
RBC: 5.36 10*6/uL — AB (ref 3.70–5.32)
RDW: 20 % — ABNORMAL HIGH (ref 11.1–15.7)
WBC: 15 10*3/uL — ABNORMAL HIGH (ref 3.9–10.0)

## 2016-12-26 LAB — COMPREHENSIVE METABOLIC PANEL (CC13)
ALK PHOS: 59 IU/L (ref 39–117)
ALT: 14 IU/L (ref 0–32)
AST (SGOT): 16 IU/L (ref 0–40)
Albumin, Serum: 3.7 g/dL (ref 3.5–4.7)
Albumin/Globulin Ratio: 1.4 (ref 1.2–2.2)
BILIRUBIN TOTAL: 0.4 mg/dL (ref 0.0–1.2)
BUN/Creatinine Ratio: 42 — ABNORMAL HIGH (ref 12–28)
BUN: 57 mg/dL — AB (ref 8–27)
CHLORIDE: 88 mmol/L — AB (ref 96–106)
Calcium, Ser: 9.6 mg/dL (ref 8.7–10.3)
Carbon Dioxide, Total: 30 mmol/L — ABNORMAL HIGH (ref 18–29)
Creatinine, Ser: 1.35 mg/dL — ABNORMAL HIGH (ref 0.57–1.00)
GFR calc Af Amer: 42 mL/min/{1.73_m2} — ABNORMAL LOW (ref >59)
GFR calc non Af Amer: 36 mL/min/{1.73_m2} — ABNORMAL LOW (ref >59)
GLUCOSE: 494 mg/dL — AB (ref 65–99)
Globulin, Total: 2.6 g/dL (ref 1.5–4.5)
Potassium, Ser: 4.4 mmol/L (ref 3.5–5.2)
Sodium: 137 mmol/L (ref 134–144)
TOTAL PROTEIN: 6.3 g/dL (ref 6.0–8.5)

## 2016-12-26 MED ORDER — PREDNISONE 20 MG PO TABS: ORAL_TABLET | ORAL | 0 refills | Status: DC

## 2016-12-26 NOTE — Progress Notes (Signed)
Hematology and Oncology Follow Up Visit  [name] [name] [id number] 03-Apr-1931 83 y.o. 12/26/2016   Principle Diagnosis:  ITP - relapsed  Current Therapy:   Prednisone alternate 60 mg PO and 40 mg PO daily   Interim History:  [name] [name] is here today with her caregiver for follow-up. Her platelet count is back up to 200 on the Prednisone 60 mg PO daily. We really do not want to leave her on Prednisone long term. We will try to wean this down again alternating 60 mg with 40 mg PO daily.  She has had no issue with infection. No fever, chills, n/v, cough, rash, dizziness, SOB, chest pain, palpitations, abdominal pain or changes in bowel or bladder habits.  No numbness or tingling in her extremities. She is still followed by wound management for her leg abrasions and has compression stockings on today. She states that she has some tenderness in her right thigh and that the doctor at her assisted living facility is following this and starting her on a prescription cream today.  She has maintained a good appetite and is staying well hydrated. She refused to have her weight taken.   ECOG Performance Status: 2 - Symptomatic, <50% confined to bed  Medications:  Allergies as of 12/26/2016      Reactions   Keflex [cephalexin] Other (See Comments)   intolerance      Medication List       Accurate as of 12/26/16  1:17 PM. Always use your most recent med list.          ACIDOPHILUS/CITRUS PECTIN Tabs Take 1 tablet by mouth daily.   albuterol (2.5 MG/3ML) 0.083% nebulizer solution Commonly known as:  PROVENTIL Take 2.5 mg by nebulization every 6 (six) hours as needed for wheezing or shortness of breath.   CALCARB 600/D PO Take 2 tablets by mouth daily.   clopidogrel 75 MG tablet Commonly known as:  PLAVIX   Digoxin 62.5 MCG Tabs Take 0.0625 mg by mouth daily.   diltiazem 240 MG 24 hr capsule Commonly known as:  CARDIZEM CD Take 1 capsule (240 mg total) by mouth daily.     famciclovir 250 MG tablet Commonly known as:  FAMVIR Take 1 tablet (250 mg total) by mouth daily.   feeding supplement (PRO-STAT SUGAR FREE 64) Liqd Take 30 mLs by mouth 2 (two) times daily.   gabapentin 600 MG tablet Commonly known as:  NEURONTIN Take 1 tablet (600 mg total) by mouth 2 (two) times daily.   metoprolol 100 MG tablet Commonly known as:  LOPRESSOR Take 1 tablet (100 mg total) by mouth 2 (two) times daily.   morphine 20 MG 24 hr capsule Commonly known as:  KADIAN Take 1 capsule (20 mg total) by mouth 2 (two) times daily.   oxyCODONE 5 MG immediate release tablet Commonly known as:  Oxy IR/ROXICODONE Take 1 tablet (5 mg total) by mouth 4 (four) times daily.   pantoprazole 40 MG tablet Commonly known as:  PROTONIX Take 1 tablet (40 mg total) by mouth daily.   potassium chloride 10 MEQ CR capsule Commonly known as:  MICRO-K   potassium chloride SA 20 MEQ tablet Commonly known as:  K-DUR,KLOR-CON Take 1 tablet (20 mEq total) by mouth 2 (two) times daily.   predniSONE 20 MG tablet Commonly known as:  DELTASONE Take 3 tablets (60 mg total) by mouth daily with breakfast.   sodium chloride 0.65 % Soln nasal spray Commonly known as:  OCEAN Place 1 spray  into both nostrils 3 (three) times daily.   SYSTANE OP Place 1 drop into both eyes 2 (two) times daily.   torsemide 20 MG tablet Commonly known as:  DEMADEX Take 4 tablets (80 mg total) by mouth 2 (two) times daily.   triamcinolone 0.025 % cream Commonly known as:  KENALOG       Allergies:  Allergies  Allergen Reactions  . Keflex [Cephalexin] Other (See Comments)    intolerance    Past Medical History, Surgical history, Social history, and Family History were reviewed and updated.  Review of Systems: All other 10 point review of systems is negative.   Physical Exam:  vitals were not taken for this visit.  Wt Readings from Last 3 Encounters:  11/23/16 197 lb 3.2 oz (89.4 kg)  11/02/16 197 lb  12.8 oz (89.7 kg)  10/01/16 195 lb (88.5 kg)    Ocular: Sclerae unicteric, pupils equal, round and reactive to light Ear-nose-throat: Oropharynx clear, dentition fair Lymphatic: No cervical, supraclavicular or axillary adenopathy Lungs no rales or rhonchi, good excursion bilaterally Heart regular rate and rhythm, no murmur appreciated Abd soft, nontender, positive bowel sounds, no liver or spleen tip palpated on exam, no fluid wave MSK no focal spinal tenderness, no joint edema Neuro: non-focal, well-oriented, appropriate affect Breasts: Deferred  Lab Results  Component Value Date   WBC 14.0 (H) 12/18/2016   HGB 13.9 12/18/2016   HCT 46.9 (H) 12/18/2016   MCV 81 12/18/2016   PLT 35 Platelet count confirmed by slide estimate (L) 12/18/2016   No results found for: FERRITIN, IRON, TIBC, UIBC, IRONPCTSAT Lab Results  Component Value Date   RBC 5.79 (H) 12/18/2016   No results found for: KPAFRELGTCHN, LAMBDASER, KAPLAMBRATIO No results found for: IGGSERUM, IGA, IGMSERUM No results found for: Odetta Pink, SPEI   Chemistry      Component Value Date/Time   NA 138 12/18/2016 0910   K 4.1 12/18/2016 0910   CL 98 (L) 11/22/2016 0439   CL 99 11/02/2016 1354   CL 98 10/15/2016 1121   CO2 28 12/18/2016 0910   BUN 65.5 (H) 12/18/2016 0910   CREATININE 1.6 (H) 12/18/2016 0910      Component Value Date/Time   CALCIUM 9.8 12/18/2016 0910   ALKPHOS 63 12/18/2016 0910   AST 13 12/18/2016 0910   ALT 13 12/18/2016 0910   BILITOT 0.59 12/18/2016 0910     Impression and Plan: [name] [name] is a very pleasant 83 yo female with history of ITP. She failed treatment with Nplate and has had a nice response to Prednisone. She did not repond well to weaning from 60 mg PO daily of prednisone to 40 mg PO daily. She has been on 60 mg daily for the last week and her platelet count went from 35 back up to 200. She has had no symptoms with this and  continue to do well. No bleeding or bruising.  We will have her start alternating Prednisone 60 mg PO daily with 40 mg PO daily (example: Monday 60 mg, Tuesday 40 mg, Wednesday 60 mg, etc.).  We will plan to see her back in 2 weeks to re-evaluate her progress.  I spent 15 minutes face to face counseling the patient and her caregiver.  Her living facility will contact us with any questions or concerns. We can certainly see her sooner if need be.   Eliezer Bottom, NP 4/11/20181:17 PM

## 2016-12-28 ENCOUNTER — Encounter (HOSPITAL_BASED_OUTPATIENT_CLINIC_OR_DEPARTMENT_OTHER): Payer: Medicare Other | Attending: Internal Medicine

## 2016-12-28 DIAGNOSIS — Z872 Personal history of diseases of the skin and subcutaneous tissue: Secondary | ICD-10-CM | POA: Diagnosis not present

## 2016-12-28 DIAGNOSIS — Z09 Encounter for follow-up examination after completed treatment for conditions other than malignant neoplasm: Secondary | ICD-10-CM | POA: Insufficient documentation

## 2017-01-09 ENCOUNTER — Ambulatory Visit (HOSPITAL_BASED_OUTPATIENT_CLINIC_OR_DEPARTMENT_OTHER): Payer: Medicare Other | Admitting: Family

## 2017-01-09 ENCOUNTER — Other Ambulatory Visit (HOSPITAL_BASED_OUTPATIENT_CLINIC_OR_DEPARTMENT_OTHER): Payer: Medicare Other

## 2017-01-09 DIAGNOSIS — D693 Immune thrombocytopenic purpura: Secondary | ICD-10-CM

## 2017-01-09 DIAGNOSIS — D696 Thrombocytopenia, unspecified: Secondary | ICD-10-CM

## 2017-01-09 LAB — CBC WITH DIFFERENTIAL (CANCER CENTER ONLY)
BASO#: 0.1 10*3/uL (ref 0.0–0.2)
BASO%: 0.3 % (ref 0.0–2.0)
EOS%: 0 % (ref 0.0–7.0)
Eosinophils Absolute: 0 10*3/uL (ref 0.0–0.5)
HEMATOCRIT: 43.9 % (ref 34.8–46.6)
HGB: 13.2 g/dL (ref 11.6–15.9)
LYMPH#: 0.3 10*3/uL — ABNORMAL LOW (ref 0.9–3.3)
LYMPH%: 1.3 % — ABNORMAL LOW (ref 14.0–48.0)
MCH: 24.9 pg — ABNORMAL LOW (ref 26.0–34.0)
MCHC: 30.1 g/dL — AB (ref 32.0–36.0)
MCV: 83 fL (ref 81–101)
MONO#: 0.1 10*3/uL (ref 0.1–0.9)
MONO%: 0.5 % (ref 0.0–13.0)
NEUT#: 19.5 10*3/uL — ABNORMAL HIGH (ref 1.5–6.5)
NEUT%: 97.9 % — AB (ref 39.6–80.0)
RBC: 5.3 10*6/uL (ref 3.70–5.32)
RDW: 21.4 % — AB (ref 11.1–15.7)
WBC: 19.9 10*3/uL — ABNORMAL HIGH (ref 3.9–10.0)

## 2017-01-09 MED ORDER — PREDNISONE 20 MG PO TABS: ORAL_TABLET | ORAL | 0 refills | Status: DC

## 2017-01-09 NOTE — Progress Notes (Signed)
Hematology and Oncology Follow Up Visit  [name] [name] [id number] [birth year] 83 y.o. 01/09/2017   Principle Diagnosis:  ITP - relapsed  Current Therapy:   Prednisone alternating 60 mg PO with 40 mg PO daily   Interim History:  [name] [name] is here today with her caregiver for follow-up. She is doing well and has no complaints at this time. Her platelet count is 88 on her current Prednisone regimen.  Hgb is stable at 13.2 with an MCV of 83. She denies having episodes of bleeding, bruising or petechiae.  No fever, chills, n/v, cough, rash, dizziness, chest pain, palpitations, abdominal pain or changes in bowel or bladder habits.  SOB with exertion is unchanged. She is on 5L [place] supplemental O2 at this time.  No lymphadenopathy found on exam.  No swelling, tenderness, numbness or tingling in her extremities. She has compression stockings on today.  She admits to not doing much exercising and spends most of her time in a wheel chair.  She states that she has a good appetite and is staying hydrated. She refused to be weighed.   ECOG Performance Status: 2 - Symptomatic, <50% confined to bed  Medications:  Allergies as of 01/09/2017      Reactions   Keflex [cephalexin] Other (See Comments)   intolerance      Medication List       Accurate as of 01/09/17  2:45 PM. Always use your most recent med list.          ACIDOPHILUS/CITRUS PECTIN Tabs Take 1 tablet by mouth daily.   albuterol (2.5 MG/3ML) 0.083% nebulizer solution Commonly known as:  PROVENTIL Take 2.5 mg by nebulization every 6 (six) hours as needed for wheezing or shortness of breath.   CALCARB 600/D PO Take 2 tablets by mouth daily.   clopidogrel 75 MG tablet Commonly known as:  PLAVIX   Digoxin 62.5 MCG Tabs Take 0.0625 mg by mouth daily.   diltiazem 240 MG 24 hr capsule Commonly known as:  CARDIZEM CD Take 1 capsule (240 mg total) by mouth daily.   famciclovir 250 MG tablet Commonly known as:   FAMVIR Take 1 tablet (250 mg total) by mouth daily.   feeding supplement (PRO-STAT SUGAR FREE 64) Liqd Take 30 mLs by mouth 2 (two) times daily.   gabapentin 600 MG tablet Commonly known as:  NEURONTIN Take 1 tablet (600 mg total) by mouth 2 (two) times daily.   metoprolol 100 MG tablet Commonly known as:  LOPRESSOR Take 1 tablet (100 mg total) by mouth 2 (two) times daily.   morphine 20 MG 24 hr capsule Commonly known as:  KADIAN Take 1 capsule (20 mg total) by mouth 2 (two) times daily.   oxyCODONE 5 MG immediate release tablet Commonly known as:  Oxy IR/ROXICODONE Take 1 tablet (5 mg total) by mouth 4 (four) times daily.   pantoprazole 40 MG tablet Commonly known as:  PROTONIX Take 1 tablet (40 mg total) by mouth daily.   potassium chloride 10 MEQ CR capsule Commonly known as:  MICRO-K   potassium chloride SA 20 MEQ tablet Commonly known as:  K-DUR,KLOR-CON Take 1 tablet (20 mEq total) by mouth 2 (two) times daily.   predniSONE 20 MG tablet Commonly known as:  DELTASONE Please alternate 60 mg PO daily with 40 mg PO daily (example: Monday 60 mg, Tuesday 40 mg, Wednesday 60 mg, etc.)   sodium chloride 0.65 % Soln nasal spray Commonly known as:  OCEAN Place 1 spray into  both nostrils 3 (three) times daily.   SYSTANE OP Place 1 drop into both eyes 2 (two) times daily.   torsemide 20 MG tablet Commonly known as:  DEMADEX Take 4 tablets (80 mg total) by mouth 2 (two) times daily.   triamcinolone 0.025 % cream Commonly known as:  KENALOG       Allergies:  Allergies  Allergen Reactions  . Keflex [Cephalexin] Other (See Comments)    intolerance    Past Medical History, Surgical history, Social history, and Family History were reviewed and updated.  Review of Systems: All other 10 point review of systems is negative.   Physical Exam:  vitals were not taken for this visit.  Wt Readings from Last 3 Encounters:  11/23/16 197 lb 3.2 oz (89.4 kg)  11/02/16  197 lb 12.8 oz (89.7 kg)  10/01/16 195 lb (88.5 kg)    Ocular: Sclerae unicteric, pupils equal, round and reactive to light Ear-nose-throat: Oropharynx clear, dentition fair Lymphatic: No cervical or supraclavicular adenopathy Lungs no rales or rhonchi, good excursion bilaterally Heart regular rate and rhythm, no murmur appreciated Abd soft, nontender, positive bowel sounds, no liver or spleen tip palpated on exam, no fluid wave  MSK no focal spinal tenderness, no joint edema Neuro: non-focal, well-oriented, appropriate affect Breasts: Deferred   Lab Results  Component Value Date   WBC 19.9 (H) 01/09/2017   HGB 13.2 01/09/2017   HCT 43.9 01/09/2017   MCV 83 01/09/2017   PLT 88 Platelet count confirmed by slide estimate (L) 01/09/2017   No results found for: FERRITIN, IRON, TIBC, UIBC, IRONPCTSAT Lab Results  Component Value Date   RBC 5.30 01/09/2017   No results found for: KPAFRELGTCHN, LAMBDASER, KAPLAMBRATIO No results found for: IGGSERUM, IGA, IGMSERUM No results found for: Odetta Pink, SPEI   Chemistry      Component Value Date/Time   NA 137 12/26/2016 1300   NA 138 12/18/2016 0910   K 4.4 12/26/2016 1300   K 4.1 12/18/2016 0910   CL 88 (L) 12/26/2016 1300   CL 98 10/15/2016 1121   CO2 30 (H) 12/26/2016 1300   CO2 28 12/18/2016 0910   BUN 57 (H) 12/26/2016 1300   BUN 65.5 (H) 12/18/2016 0910   CREATININE 1.35 (H) 12/26/2016 1300   CREATININE 1.6 (H) 12/18/2016 0910      Component Value Date/Time   CALCIUM 9.6 12/26/2016 1300   CALCIUM 9.8 12/18/2016 0910   ALKPHOS 59 12/26/2016 1300   ALKPHOS 63 12/18/2016 0910   AST 16 12/26/2016 1300   AST 13 12/18/2016 0910   ALT 14 12/26/2016 1300   ALT 13 12/18/2016 0910   BILITOT 0.4 12/26/2016 1300   BILITOT 0.59 12/18/2016 0910      Impression and Plan: [name] [name] is a very pleasant 83 yo caucasian female with ITP that has relapsed. She is currently on  Prednisone alternating 60 mg PO daily with 40 mg PO daily. Her platelet count today is 88. Hgb no anemia and WBC count is 19.9 with steroid. She has no complaints at this time and denies having any episodes of bleeding.  We will have her continue on this same dose and follow-up in 2 weeks with repeat lab and Dr.  All questions were answered and she is in agreement with the plan. I filled out her packet for the nursing home and included a new Prednisone prescription and today's CBC.  I spent 15 minutes face to face with  greater that 50% of that spent counseling and coordinating the patient's care.  Both she and her facility know to contact our office with any questions or concerns. We can certainly see her sooner if need be.   Eliezer Bottom, NP 4/25/20182:45 PM

## 2017-01-10 LAB — COMPREHENSIVE METABOLIC PANEL (CC13)
ALBUMIN: 3.4 g/dL — AB (ref 3.5–4.7)
ALK PHOS: 77 IU/L (ref 39–117)
ALT: 12 IU/L (ref 0–32)
AST (SGOT): 11 IU/L (ref 0–40)
Albumin/Globulin Ratio: 1.1 — ABNORMAL LOW (ref 1.2–2.2)
BILIRUBIN TOTAL: 0.5 mg/dL (ref 0.0–1.2)
BUN / CREAT RATIO: 47 — AB (ref 12–28)
BUN: 63 mg/dL — ABNORMAL HIGH (ref 8–27)
CO2: 31 mmol/L — AB (ref 18–29)
CREATININE: 1.35 mg/dL — AB (ref 0.57–1.00)
Calcium, Ser: 9.1 mg/dL (ref 8.7–10.3)
Chloride, Ser: 87 mmol/L — ABNORMAL LOW (ref 96–106)
GFR calc Af Amer: 42 mL/min/{1.73_m2} — ABNORMAL LOW (ref >59)
GFR calc non Af Amer: 36 mL/min/{1.73_m2} — ABNORMAL LOW (ref >59)
GLUCOSE: 693 mg/dL — AB (ref 65–99)
Globulin, Total: 3.2 g/dL (ref 1.5–4.5)
Potassium, Ser: 5.5 mmol/L — ABNORMAL HIGH (ref 3.5–5.2)
SODIUM: 132 mmol/L — AB (ref 134–144)
Total Protein: 6.6 g/dL (ref 6.0–8.5)

## 2017-01-18 ENCOUNTER — Observation Stay (HOSPITAL_COMMUNITY): Payer: Medicare Other

## 2017-01-18 ENCOUNTER — Encounter (HOSPITAL_COMMUNITY): Payer: Self-pay | Admitting: Emergency Medicine

## 2017-01-18 ENCOUNTER — Emergency Department (HOSPITAL_COMMUNITY): Payer: Medicare Other

## 2017-01-18 ENCOUNTER — Inpatient Hospital Stay (HOSPITAL_COMMUNITY)
Admission: EM | Admit: 2017-01-18 | Discharge: 2017-01-22 | DRG: 917 | Disposition: A | Discharge status: Skilled Nursing Facility | Payer: Medicare Other | Attending: Family Medicine | Admitting: Family Medicine

## 2017-01-18 DIAGNOSIS — D693 Immune thrombocytopenic purpura: Secondary | ICD-10-CM | POA: Diagnosis present

## 2017-01-18 DIAGNOSIS — R400 Somnolence: Secondary | ICD-10-CM | POA: Diagnosis not present

## 2017-01-18 DIAGNOSIS — N183 Chronic kidney disease, stage 3 unspecified: Secondary | ICD-10-CM | POA: Diagnosis present

## 2017-01-18 DIAGNOSIS — J9612 Chronic respiratory failure with hypercapnia: Secondary | ICD-10-CM

## 2017-01-18 DIAGNOSIS — I4891 Unspecified atrial fibrillation: Secondary | ICD-10-CM | POA: Diagnosis present

## 2017-01-18 DIAGNOSIS — I5033 Acute on chronic diastolic (congestive) heart failure: Secondary | ICD-10-CM | POA: Diagnosis present

## 2017-01-18 DIAGNOSIS — I872 Venous insufficiency (chronic) (peripheral): Secondary | ICD-10-CM | POA: Diagnosis not present

## 2017-01-18 DIAGNOSIS — J9621 Acute and chronic respiratory failure with hypoxia: Secondary | ICD-10-CM | POA: Diagnosis present

## 2017-01-18 DIAGNOSIS — I482 Chronic atrial fibrillation, unspecified: Secondary | ICD-10-CM | POA: Diagnosis present

## 2017-01-18 DIAGNOSIS — Z66 Do not resuscitate: Secondary | ICD-10-CM | POA: Diagnosis present

## 2017-01-18 DIAGNOSIS — I25810 Atherosclerosis of coronary artery bypass graft(s) without angina pectoris: Secondary | ICD-10-CM | POA: Diagnosis not present

## 2017-01-18 DIAGNOSIS — I10 Essential (primary) hypertension: Secondary | ICD-10-CM | POA: Diagnosis not present

## 2017-01-18 DIAGNOSIS — T402X1A Poisoning by other opioids, accidental (unintentional), initial encounter: Principal | ICD-10-CM | POA: Diagnosis present

## 2017-01-18 DIAGNOSIS — I5032 Chronic diastolic (congestive) heart failure: Secondary | ICD-10-CM | POA: Diagnosis not present

## 2017-01-18 DIAGNOSIS — E1122 Type 2 diabetes mellitus with diabetic chronic kidney disease: Secondary | ICD-10-CM | POA: Diagnosis present

## 2017-01-18 DIAGNOSIS — G8929 Other chronic pain: Secondary | ICD-10-CM | POA: Diagnosis present

## 2017-01-18 DIAGNOSIS — G92 Toxic encephalopathy: Secondary | ICD-10-CM | POA: Diagnosis present

## 2017-01-18 DIAGNOSIS — I130 Hypertensive heart and chronic kidney disease with heart failure and stage 1 through stage 4 chronic kidney disease, or unspecified chronic kidney disease: Secondary | ICD-10-CM | POA: Diagnosis present

## 2017-01-18 DIAGNOSIS — E785 Hyperlipidemia, unspecified: Secondary | ICD-10-CM | POA: Diagnosis present

## 2017-01-18 DIAGNOSIS — I83009 Varicose veins of unspecified lower extremity with ulcer of unspecified site: Secondary | ICD-10-CM | POA: Diagnosis present

## 2017-01-18 DIAGNOSIS — Z79899 Other long term (current) drug therapy: Secondary | ICD-10-CM

## 2017-01-18 DIAGNOSIS — R7989 Other specified abnormal findings of blood chemistry: Secondary | ICD-10-CM

## 2017-01-18 DIAGNOSIS — R4182 Altered mental status, unspecified: Secondary | ICD-10-CM

## 2017-01-18 DIAGNOSIS — R198 Other specified symptoms and signs involving the digestive system and abdomen: Secondary | ICD-10-CM

## 2017-01-18 DIAGNOSIS — Y92129 Unspecified place in nursing home as the place of occurrence of the external cause: Secondary | ICD-10-CM

## 2017-01-18 DIAGNOSIS — R109 Unspecified abdominal pain: Secondary | ICD-10-CM

## 2017-01-18 DIAGNOSIS — R0902 Hypoxemia: Secondary | ICD-10-CM

## 2017-01-18 DIAGNOSIS — R410 Disorientation, unspecified: Secondary | ICD-10-CM

## 2017-01-18 DIAGNOSIS — Z794 Long term (current) use of insulin: Secondary | ICD-10-CM

## 2017-01-18 DIAGNOSIS — J9611 Chronic respiratory failure with hypoxia: Secondary | ICD-10-CM

## 2017-01-18 DIAGNOSIS — G9340 Encephalopathy, unspecified: Secondary | ICD-10-CM | POA: Diagnosis present

## 2017-01-18 DIAGNOSIS — F1120 Opioid dependence, uncomplicated: Secondary | ICD-10-CM | POA: Diagnosis not present

## 2017-01-18 DIAGNOSIS — I2510 Atherosclerotic heart disease of native coronary artery without angina pectoris: Secondary | ICD-10-CM | POA: Diagnosis present

## 2017-01-18 DIAGNOSIS — Z9981 Dependence on supplemental oxygen: Secondary | ICD-10-CM

## 2017-01-18 LAB — I-STAT CG4 LACTIC ACID, ED: LACTIC ACID, VENOUS: 1.43 mmol/L (ref 0.5–1.9)

## 2017-01-18 LAB — URINALYSIS, ROUTINE W REFLEX MICROSCOPIC
Bacteria, UA: NONE SEEN
Bilirubin Urine: NEGATIVE
GLUCOSE, UA: NEGATIVE mg/dL
Ketones, ur: NEGATIVE mg/dL
Leukocytes, UA: NEGATIVE
NITRITE: NEGATIVE
PH: 6 (ref 5.0–8.0)
PROTEIN: NEGATIVE mg/dL
SPECIFIC GRAVITY, URINE: 1.01 (ref 1.005–1.030)

## 2017-01-18 LAB — COMPREHENSIVE METABOLIC PANEL
ALBUMIN: 3.2 g/dL — AB (ref 3.5–5.0)
ALK PHOS: 75 U/L (ref 38–126)
ALT: 15 U/L (ref 14–54)
ANION GAP: 11 (ref 5–15)
AST: 27 U/L (ref 15–41)
BILIRUBIN TOTAL: 1.6 mg/dL — AB (ref 0.3–1.2)
BUN: 43 mg/dL — ABNORMAL HIGH (ref 6–20)
CALCIUM: 8.7 mg/dL — AB (ref 8.9–10.3)
CO2: 31 mmol/L (ref 22–32)
Chloride: 97 mmol/L — ABNORMAL LOW (ref 101–111)
Creatinine, Ser: 1.29 mg/dL — ABNORMAL HIGH (ref 0.44–1.00)
GFR calc Af Amer: 43 mL/min — ABNORMAL LOW (ref >60)
GFR, EST NON AFRICAN AMERICAN: 37 mL/min — AB (ref >60)
GLUCOSE: 146 mg/dL — AB (ref 65–99)
Potassium: 4.2 mmol/L (ref 3.5–5.1)
Sodium: 139 mmol/L (ref 135–145)
TOTAL PROTEIN: 6.8 g/dL (ref 6.5–8.1)

## 2017-01-18 LAB — CBC
HCT: 48.3 % — ABNORMAL HIGH (ref 36.0–46.0)
HEMOGLOBIN: 14.3 g/dL (ref 12.0–15.0)
MCH: 25.7 pg — ABNORMAL LOW (ref 26.0–34.0)
MCHC: 29.6 g/dL — AB (ref 30.0–36.0)
MCV: 86.7 fL (ref 78.0–100.0)
Platelets: 95 10*3/uL — ABNORMAL LOW (ref 150–400)
RBC: 5.57 MIL/uL — ABNORMAL HIGH (ref 3.87–5.11)
RDW: 21.8 % — AB (ref 11.5–15.5)
WBC: 15.1 10*3/uL — AB (ref 4.0–10.5)

## 2017-01-18 LAB — ETHANOL

## 2017-01-18 LAB — BLOOD GAS, ARTERIAL
ACID-BASE EXCESS: 6.8 mmol/L — AB (ref 0.0–2.0)
Bicarbonate: 31.1 mmol/L — ABNORMAL HIGH (ref 20.0–28.0)
Drawn by: 270211
O2 SAT: 89.7 %
PCO2 ART: 44.1 mmHg (ref 32.0–48.0)
PH ART: 7.462 — AB (ref 7.350–7.450)
PO2 ART: 57.6 mmHg — AB (ref 83.0–108.0)
Patient temperature: 98.6

## 2017-01-18 LAB — MRSA PCR SCREENING: MRSA BY PCR: POSITIVE — AB

## 2017-01-18 LAB — CBG MONITORING, ED: GLUCOSE-CAPILLARY: 133 mg/dL — AB (ref 65–99)

## 2017-01-18 LAB — BRAIN NATRIURETIC PEPTIDE: B Natriuretic Peptide: 428.7 pg/mL — ABNORMAL HIGH (ref 0.0–100.0)

## 2017-01-18 LAB — AMMONIA: AMMONIA: 28 umol/L (ref 9–35)

## 2017-01-18 LAB — RAPID URINE DRUG SCREEN, HOSP PERFORMED
AMPHETAMINES: NOT DETECTED
Barbiturates: NOT DETECTED
Benzodiazepines: NOT DETECTED
COCAINE: NOT DETECTED
OPIATES: POSITIVE — AB
TETRAHYDROCANNABINOL: NOT DETECTED

## 2017-01-18 LAB — LIPASE, BLOOD: Lipase: 21 U/L (ref 11–51)

## 2017-01-18 LAB — SALICYLATE LEVEL: Salicylate Lvl: <7 mg/dL (ref 2.8–30.0)

## 2017-01-18 LAB — I-STAT TROPONIN, ED: TROPONIN I, POC: 0.01 ng/mL (ref 0.00–0.08)

## 2017-01-18 LAB — ACETAMINOPHEN LEVEL

## 2017-01-18 LAB — DIGOXIN LEVEL: DIGOXIN LVL: 0.7 ng/mL — AB (ref 0.8–2.0)

## 2017-01-18 LAB — PROTIME-INR
INR: 1.2
Prothrombin Time: 15.3 seconds — ABNORMAL HIGH (ref 11.4–15.2)

## 2017-01-18 MED ORDER — LINAGLIPTIN 5 MG PO TABS
5.0000 mg | ORAL_TABLET | Freq: Every day | ORAL | Status: DC
  Administered 2017-01-19 – 2017-01-22 (×4): 5 mg via ORAL
  Filled 2017-01-18 (×4): qty 1

## 2017-01-18 MED ORDER — METOPROLOL TARTRATE 50 MG PO TABS: 100.0000 mg | ORAL_TABLET | Freq: Two times a day (BID) | ORAL | Status: DC

## 2017-01-18 MED ORDER — ACETAMINOPHEN 325 MG PO TABS
650.0000 mg | ORAL_TABLET | Freq: Four times a day (QID) | ORAL | Status: DC | PRN
  Administered 2017-01-19: 650 mg via ORAL
  Filled 2017-01-18 (×2): qty 2

## 2017-01-18 MED ORDER — DIGOXIN 0.0625 MG HALF TABLET
0.0625 mg | ORAL_TABLET | Freq: Every day | ORAL | Status: DC
  Administered 2017-01-20 – 2017-01-22 (×3): 0.0625 mg via ORAL
  Filled 2017-01-18 (×2): qty 1
  Filled 2017-01-18: qty 0.5
  Filled 2017-01-18 (×2): qty 1

## 2017-01-18 MED ORDER — DILTIAZEM HCL ER COATED BEADS 240 MG PO CP24
240.0000 mg | ORAL_CAPSULE | Freq: Every evening | ORAL | Status: DC
  Administered 2017-01-19 – 2017-01-21 (×3): 240 mg via ORAL
  Filled 2017-01-18 (×3): qty 1

## 2017-01-18 MED ORDER — VALACYCLOVIR HCL 500 MG PO TABS
500.0000 mg | ORAL_TABLET | Freq: Every day | ORAL | Status: DC
  Administered 2017-01-19 – 2017-01-22 (×4): 500 mg via ORAL
  Filled 2017-01-18 (×4): qty 1

## 2017-01-18 MED ORDER — METOPROLOL TARTRATE 5 MG/5ML IV SOLN
2.5000 mg | INTRAVENOUS | Status: DC | PRN
  Administered 2017-01-19: 2.5 mg via INTRAVENOUS
  Filled 2017-01-18: qty 5

## 2017-01-18 MED ORDER — PANTOPRAZOLE SODIUM 40 MG PO TBEC
40.0000 mg | DELAYED_RELEASE_TABLET | Freq: Every day | ORAL | Status: DC
  Administered 2017-01-19 – 2017-01-22 (×4): 40 mg via ORAL
  Filled 2017-01-18 (×4): qty 1

## 2017-01-18 MED ORDER — POLYVINYL ALCOHOL 1.4 % OP SOLN
2.0000 [drp] | Freq: Two times a day (BID) | OPHTHALMIC | Status: DC
  Administered 2017-01-18 – 2017-01-22 (×8): 2 [drp] via OPHTHALMIC
  Filled 2017-01-18: qty 15

## 2017-01-18 MED ORDER — PANTOPRAZOLE SODIUM 40 MG IV SOLR
40.0000 mg | Freq: Once | INTRAVENOUS | Status: AC
  Administered 2017-01-18: 40 mg via INTRAVENOUS
  Filled 2017-01-18: qty 40

## 2017-01-18 MED ORDER — SODIUM CHLORIDE 0.9 % IV SOLN
Freq: Once | INTRAVENOUS | Status: AC
  Administered 2017-01-18: 16:00:00 via INTRAVENOUS

## 2017-01-18 MED ORDER — METOPROLOL TARTRATE 5 MG/5ML IV SOLN
5.0000 mg | Freq: Four times a day (QID) | INTRAVENOUS | Status: AC
  Administered 2017-01-18 – 2017-01-19 (×2): 5 mg via INTRAVENOUS
  Filled 2017-01-18 (×2): qty 5

## 2017-01-18 MED ORDER — TORSEMIDE 20 MG PO TABS
80.0000 mg | ORAL_TABLET | Freq: Every day | ORAL | Status: DC
  Filled 2017-01-18: qty 4

## 2017-01-18 MED ORDER — DILTIAZEM HCL ER COATED BEADS 240 MG PO CP24
240.0000 mg | ORAL_CAPSULE | Freq: Every evening | ORAL | Status: DC
  Filled 2017-01-18: qty 1

## 2017-01-18 MED ORDER — CALCIUM CARBONATE-VITAMIN D 500-200 MG-UNIT PO TABS
2.0000 | ORAL_TABLET | Freq: Every day | ORAL | Status: DC
  Administered 2017-01-19 – 2017-01-22 (×4): 2 via ORAL
  Filled 2017-01-18: qty 1
  Filled 2017-01-18 (×2): qty 2
  Filled 2017-01-18: qty 1

## 2017-01-18 MED ORDER — SODIUM CHLORIDE 0.9% FLUSH
3.0000 mL | Freq: Two times a day (BID) | INTRAVENOUS | Status: DC
  Administered 2017-01-18 – 2017-01-22 (×8): 3 mL via INTRAVENOUS

## 2017-01-18 MED ORDER — ACETAMINOPHEN 650 MG RE SUPP: 650.0000 mg | Freq: Four times a day (QID) | RECTAL | Status: DC | PRN

## 2017-01-18 MED ORDER — POTASSIUM CHLORIDE CRYS ER 20 MEQ PO TBCR
20.0000 meq | EXTENDED_RELEASE_TABLET | Freq: Two times a day (BID) | ORAL | Status: DC
  Administered 2017-01-19 – 2017-01-22 (×7): 20 meq via ORAL
  Filled 2017-01-18 (×7): qty 1

## 2017-01-18 MED ORDER — ALBUTEROL SULFATE (2.5 MG/3ML) 0.083% IN NEBU
2.5000 mg | INHALATION_SOLUTION | Freq: Four times a day (QID) | RESPIRATORY_TRACT | Status: DC | PRN
  Administered 2017-01-19: 2.5 mg via RESPIRATORY_TRACT
  Filled 2017-01-18: qty 3

## 2017-01-18 MED ORDER — PREDNISONE 20 MG PO TABS
40.0000 mg | ORAL_TABLET | ORAL | Status: DC
  Administered 2017-01-19 – 2017-01-21 (×2): 40 mg via ORAL
  Filled 2017-01-18 (×3): qty 2

## 2017-01-18 MED ORDER — PREDNISONE 20 MG PO TABS
60.0000 mg | ORAL_TABLET | ORAL | Status: DC
  Administered 2017-01-20 – 2017-01-22 (×2): 60 mg via ORAL
  Filled 2017-01-18 (×2): qty 3

## 2017-01-18 NOTE — Progress Notes (Signed)
Pt admitted to 1412 from the ER. Pt alert to name called unable to provide history and no family is available at this time. Skin assessed by 2 RNs bilateral lower extremities  From the knees down discolored and dry patchy areas noted on both legs. Old scars noted to both legs. Right second toe red but blanchable. Bilateral heels red but blanchable. Pt's bottom and between legs red Pt was incontinent wearing diaper on admission removed and Pt cleaned.

## 2017-01-18 NOTE — H&P (Signed)
History and Physical    [name] [name] TFT:[id number] DOB: [birth year] DOA: 01/18/2017  PCP: Lajean Manes, MD Patient coming from: SNF  Chief Complaint: Altered mental status  HPI: [name] [name] is a 83 y.o. female with medical history significant of atrial fibrillation, diastolic heart failure, chronic ITP, hypertension, CKD stage III. Patient unable to provide a history secondary to confusion. Per chart review and ED physician report, patient presented from her skilled nursing facility. There is a concern that she may have overdosed on pain medication, which is administered by the skilled nursing's facility staff. Patient was transported to the emergency department via EMS where she received Narcan without improvement of her mental status. She was placed on supplemental oxygen, for which she is on chronically at baseline. Upon arriving to the ED, she was continued on oxygen and mental status improved slowly  But still significantly altered.   ED Course: Vitals: Afebrile. Pulse and low 100s. Respirations and low 20s. A pressure elevated to 140s. Down to 88% on room air, placed on nasal cannula Labs: Creatinine 1.29, total bilirubin 1.6, platelets 95, white blood cell count 15.1, INR 1.20, negative Tylenol and salicylate level, Subtherapeutic digoxin level of 0.7 Imaging:  Chest x-ray significant for chronic mild interstitial edema Medications/Course:  Narcan en route to the ED, Protonix  Review of Systems: Review of Systems  Unable to perform ROS: Mental status change  Gastrointestinal: Positive for abdominal pain.    Past Medical History:  Diagnosis Date  . Atrial fibrillation ([place])   . CAD (coronary artery disease)    a. NSTEMI 2/14 => LHC 10/20/12:  dLM 20%, pLAD 99%, mLAD 60% followed by 99%, oD1 99%, and pD2 30%, mild plaque disease in the CFX and RCA. PCI: Xience Xpedition DES to the proximal and mid LAD  . Chronic diastolic CHF (congestive heart failure) ([place])    Echo  (08/10/13): Mild LVH, EF 60-65%.  . DJD (degenerative joint disease)   . Dyslipidemia   . HTN (hypertension)   . Osteopenia   . Thrombocytopenia ([place])   . Venous ulcer ([place])     Past Surgical History:  Procedure Laterality Date  . BACK SURGERY     Lumbar disc  . EYE SURGERY     Blepharoplasty  . HIP SURGERY     R THR  . INSERTION / PLACEMENT / REVISION NEUROSTIMULATOR    . JOINT REPLACEMENT    . LEFT HEART CATHETERIZATION WITH CORONARY ANGIOGRAM N/A 10/20/2012   Procedure: LEFT HEART CATHETERIZATION WITH CORONARY ANGIOGRAM;  Surgeon: Burnell Blanks, MD;  Location: [facility] C[facility] LAB;  Service: Cardiovascular;  Laterality: N/A;     reports that she has never smoked. She has never used smokeless tobacco. She reports that she does not drink alcohol or use drugs.  Allergies  Allergen Reactions  . Keflex [Cephalexin] Other (See Comments)    Reaction:  Unknown     Family History  Problem Relation Age of Onset  . CAD Father 61    Vague history     Prior to Admission medications   Medication Sig Start Date End Date Taking? Authorizing Provider  albuterol (PROVENTIL) (2.5 MG/3ML) 0.083% nebulizer solution Take 2.5 mg by nebulization every 6 (six) hours as needed for wheezing or shortness of breath.   Yes [provider]  Calcium Carbonate-Vitamin D (CALCIUM 600+D) 600-200 MG-UNIT TABS Take 2 tablets by mouth daily.   Yes [provider]  digoxin (LANOXIN) 0.125 MG tablet Take 0.0625 mg by mouth daily.  Yes [provider]  diltiazem (CARDIZEM CD) 240 MG 24 hr capsule Take 1 capsule (240 mg total) by mouth daily. Patient taking differently: Take 240 mg by mouth every evening.  08/18/13  Yes Velvet Bathe, MD  famciclovir (FAMVIR) 250 MG tablet Take 1 tablet (250 mg total) by mouth daily. 08/03/16  Yes [place], Holli Humbles, NP  gabapentin (NEURONTIN) 600 MG tablet Take 1 tablet (600 mg total) by mouth 2 (two) times daily. 09/20/16  Yes Dessa Phi  Chahn-Yang, DO  insulin lispro (HUMALOG) 100 UNIT/ML injection Inject 0-15 Units into the skin 3 (three) times daily as needed for high blood sugar. Pt uses as needed per sliding scale:  70-120:  0 units, 121-150:  2 units, 151-200:  3 units, 201-250:  5 units, 251-300:  8 units, 301-350:  11 units, 351-400:  15 units, Greater than 400:  Call MD   Yes [provider]  Lactobacillus Acid-Pectin (ACIDOPHILUS/CITRUS PECTIN) TABS Take 1 tablet by mouth every evening.    Yes [provider]  metoprolol (LOPRESSOR) 100 MG tablet Take 1 tablet (100 mg total) by mouth 2 (two) times daily. 08/18/13  Yes Velvet Bathe, MD  morphine (KADIAN) 10 MG 24 hr capsule Take 10 mg by mouth 2 (two) times daily.   Yes [provider]  Oxycodone HCl 10 MG TABS Take 10 mg by mouth 4 (four) times daily.   Yes [provider]  pantoprazole (PROTONIX) 40 MG tablet Take 1 tablet (40 mg total) by mouth daily. 08/03/16  Yes [place], Holli Humbles, NP  Polyethyl Glycol-Propyl Glycol (SYSTANE) 0.4-0.3 % SOLN Place 2 drops into both eyes 2 (two) times daily.   Yes [provider]  potassium chloride SA (K-DUR,KLOR-CON) 20 MEQ tablet Take 1 tablet (20 mEq total) by mouth 2 (two) times daily. 08/28/13  Yes Richardson Dopp T, PA-C  predniSONE (DELTASONE) 20 MG tablet Please alternate 60 mg PO daily with 40 mg PO daily (example: Monday 60 mg, Tuesday 40 mg, Wednesday 60 mg, etc.) 01/09/17  Yes [place], Holli Humbles, NP  sitaGLIPtin (JANUVIA) 50 MG tablet Take 50 mg by mouth daily.   Yes [provider]  sodium chloride (OCEAN) 0.65 % SOLN nasal spray Place 1 spray into both nostrils 3 (three) times daily.    Yes [provider]  torsemide (DEMADEX) 20 MG tablet Take 4 tablets (80 mg total) by mouth 2 (two) times daily. Patient taking differently: Take 80 mg by mouth daily.  11/23/16  Yes Rama, Venetia Maxon, MD    Physical Exam: Vitals:   01/18/17 1354 01/18/17 1400 01/18/17 1500  01/18/17 1530  BP: (!) 171/78  (!) 160/74 (!) 151/72  Pulse: 98  (!) 105 (!) 105  Resp: 14  17 (!) 23  Temp: 98.3 F (36.8 C)     TempSrc: Oral     SpO2: 91%  93% 93%  Weight:  89.4 kg (197 lb)       Constitutional: NAD, calm, comfortable Eyes: PERRL, lids and conjunctivae normal ENMT: Mucous membranes are moist. Posterior pharynx clear of any exudate or lesions.  Neck: normal, supple, no masses, no thyromegaly Respiratory: clear to auscultation bilaterally, no wheezing, no crackles. Normal respiratory effort. No accessory muscle use.  Cardiovascular: increased rate and irregular rhythm, no murmurs. Trace lower extremity edema. Could not feel pedal pulses. Abdomen: upper abdomen tenderness, no masses palpated. No hepatosplenomegaly. Bowel sounds positive. Mild guarding. Musculoskeletal: no clubbing / cyanosis. No joint deformity upper and lower extremities. Good  ROM, no contractures. Normal muscle tone.  Skin: Chronic venous stasis changes on bilateral legs Neurologic: CN 2-12 could not be assessed. Sensation intact, DTR normal. Strength 4/5 in all 4.  Psychiatric: Impaired judgment and insight. Somnolent and oriented to person only. Normal mood.    Labs on Admission: I have personally reviewed following labs and imaging studies  CBC:  Recent Labs Lab 01/18/17 1435  WBC 15.1*  HGB 14.3  HCT 48.3*  MCV 86.7  PLT 95*   Basic Metabolic Panel:  Recent Labs Lab 01/18/17 1435  NA 139  K 4.2  CL 97*  CO2 31  GLUCOSE 146*  BUN 43*  CREATININE 1.29*  CALCIUM 8.7*   GFR: Estimated Creatinine Clearance: 35.8 mL/min (A) (by C-G formula based on SCr of 1.29 mg/dL (H)). Liver Function Tests:  Recent Labs Lab 01/18/17 1435  AST 27  ALT 15  ALKPHOS 75  BILITOT 1.6*  PROT 6.8  ALBUMIN 3.2*    Recent Labs Lab 01/18/17 1518  LIPASE 21    Recent Labs Lab 01/18/17 1524  AMMONIA 28   Coagulation Profile:  Recent Labs Lab 01/18/17 1518  INR 1.20    CBG:  Recent Labs Lab 01/18/17 1415  GLUCAP 133*   Urine analysis:    Component Value Date/Time   COLORURINE YELLOW 09/12/2016 0438   APPEARANCEUR CLEAR 09/12/2016 0438   LABSPEC 1.008 09/12/2016 0438   PHURINE 6.0 09/12/2016 0438   GLUCOSEU NEGATIVE 09/12/2016 0438   HGBUR NEGATIVE 09/12/2016 0438   BILIRUBINUR NEGATIVE 09/12/2016 0438   KETONESUR NEGATIVE 09/12/2016 0438   PROTEINUR NEGATIVE 09/12/2016 0438   UROBILINOGEN 0.2 08/10/2013 0037   NITRITE NEGATIVE 09/12/2016 0438   LEUKOCYTESUR TRACE (A) 09/12/2016 0438    Radiological Exams on Admission: Dg Chest Port 1 View  Result Date: 01/18/2017 CLINICAL DATA:  Hypoxia EXAM: PORTABLE CHEST 1 VIEW COMPARISON:  11/17/2016 FINDINGS: Low lung volumes with chronic interstitial prominence. Cardiomegaly with mild aortic atherosclerosis. No pneumonic consolidation, effusion or pneumothorax. Glenohumeral joint osteoarthritis bilaterally with remodeled appearance of the glenoid fossa and both humeral heads. Question chronic humeral neck impacted fractures bilaterally. Evidence prior spinal fusion along the visualized lower thoracic and upper lumbar spine. Neural stimulator lead projects over the mid thoracic spine. IMPRESSION: Stable cardiomegaly with chronic interstitial lung markings some of which may represent chronic mild interstitial edema. Electronically Signed   By: Ashley Royalty M.D.   On: 01/18/2017 15:13    EKG: I personally reviewed the EKG and these are my findings. Atrial fibrillation with heart rate of 110.  Assessment/Plan Active Problems:   HTN (hypertension)   CAD (coronary artery disease) of artery bypass graft   Atrial fibrillation (HCC)   Chronic ITP (idiopathic thrombocytopenia) (HCC)   Stage III chronic kidney disease   Venous stasis dermatitis of both lower extremities   Altered mental status   Chronic diastolic heart failure (HCC)   Chronic respiratory failure with hypoxia (HCC)   Altered mental  status Possibly secondary to medications. Ammonia negative. Head CT negative. BUN stable. Will watch overnight for improvement of mental status but appears to be improving. -hold morphine and oxycodone -watch mental status overnight -frequent neuro checks -CT head without contrast  Abdominal pain Difficult to get a good history. Patient guarding. Vitals stable. Afebrile. -CT abdomen pelvis without contrast  Atrial fibrillation Borderline. Blood pressure stable. -continue home Cardizem and metoprolol -telemetry since HR on higher side  Chronic respiratory failure Uses 4 L at baseline. Currently at 2 L with  adequate oxygen saturations. -Continue oxygen therapy  Essential hypertension -continue diltiazem and metoprolol   ITP Platelets stable. -continue prednisone  Chronic diastolic heart failure Last EF of 65-70% without mention of diastolic dysfunction on echo from 11/18/2016. Echo in 10/18/2012 significant for grade 1 diastolic dysfunction. Euvolemic. -daily weights -strict in/out -Continue metoprolol -Continue digoxin -Continue torsemide and potassium   Diabetes mellitus Continue Januvia  Stage III CKD Stable.  Venous stasis ulcers No evidence of infection  Chronic pain Hold narcotics secondary to altered mental status   DVT prophylaxis: SCDs secondary to thrombocytopenia Code Status: DNR per Gold form Family Communication: None at bedside Disposition Plan: Discharge to SNF when medically stable Consults called: None Admission status: Observation, telemetry   Cordelia Poche, MD [facility] Pager [phone]  If 7PM-7AM, please contact night-coverage [email or web address] Password [email or web address]  01/18/2017, 5:05 PM

## 2017-01-18 NOTE — ED Notes (Signed)
Main lab called for lab draw

## 2017-01-18 NOTE — ED Triage Notes (Signed)
Per EMS pt from [facility]; nursing staff "believe pt overdosed on pain medication." Pt does not have access to medication; has only been given scheduled medications per staff. Pt alert per normal but "less alert."

## 2017-01-18 NOTE — ED Notes (Signed)
Lab draw attempted x 2 unsuccessful

## 2017-01-18 NOTE — ED Notes (Signed)
Bed: RESB Expected date:  Expected time:  Means of arrival:  Comments: EMS-AMS

## 2017-01-18 NOTE — ED Notes (Signed)
This writer was unable to draw labs with IV start.

## 2017-01-18 NOTE — ED Provider Notes (Signed)
[facility] DEPT Provider Note   CSN: [id number] Arrival date & time: 01/18/17  1341     History   Chief Complaint Chief Complaint  Patient presents with  . Drug Overdose    HPI [name] [name] is a 83 y.o. female.  HPI For nursing home patient is less alert than usual. There is very limited additional detail. The patient reports she doesn't feel well but doesn't specify particular symptoms. Nursing home staff thought the patient may have overdosed on pain medication. They do however report they provide the patient her medications and she does not self medicate. EMS administered Narcan with no change in the patient's baseline mental status. The patient does not recognize this as a problem she is aware of. She does have severe chronic medical problems including pulmonary hypertension and chronic hypoxia with O2 requirement. Past Medical History:  Diagnosis Date  . Atrial fibrillation ([place])   . CAD (coronary artery disease)    a. NSTEMI 2/14 => LHC 10/20/12:  dLM 20%, pLAD 99%, mLAD 60% followed by 99%, oD1 99%, and pD2 30%, mild plaque disease in the CFX and RCA. PCI: Xience Xpedition DES to the proximal and mid LAD  . Chronic diastolic CHF (congestive heart failure) ([place])    Echo (08/10/13): Mild LVH, EF 60-65%.  . DJD (degenerative joint disease)   . Dyslipidemia   . HTN (hypertension)   . Osteopenia   . Thrombocytopenia ([place])   . Venous ulcer [facility])     Patient Active Problem List   Diagnosis Date Noted  . Stage III chronic kidney disease 11/21/2016  . Obesity (BMI 30.0-34.9) 11/21/2016  . Venous stasis ulcer ([place]) 11/21/2016  . Venous stasis dermatitis of both lower extremities 11/21/2016  . MRSA carrier 11/21/2016  . Acute on chronic diastolic CHF (congestive heart failure) ([place]) 11/20/2016  . Goals of care, counseling/discussion   . Palliative care by specialist   . Chronic ITP (idiopathic thrombocytopenia) ([place]) 10/01/2016  . Gangrene ([place])   . Acute on chronic  respiratory failure with hypoxia ([place])   . Coronary atherosclerosis of native coronary artery 10/09/2013  . Atrial fibrillation ([place]) 10/09/2013  . Leukocytosis 08/11/2013  . Chronic narcotic dependence ([place]) 07/16/2013  . Varicose veins of lower extremities with other complications 52/84/1324  . Edema 05/14/2013  . Hypokalemia 12/20/2012  . CAD (coronary artery disease) of artery bypass graft 12/18/2012  . Unstable angina ([place]) 10/18/2012  . Thrombocytopenia ([place]) 10/18/2012  . HTN (hypertension) 10/17/2012  . Chronic back pain 10/17/2012  . Precordial pain 10/17/2012    Past Surgical History:  Procedure Laterality Date  . BACK SURGERY     Lumbar disc  . EYE SURGERY     Blepharoplasty  . HIP SURGERY     R THR  . INSERTION / PLACEMENT / REVISION NEUROSTIMULATOR    . JOINT REPLACEMENT    . LEFT HEART CATHETERIZATION WITH CORONARY ANGIOGRAM N/A 10/20/2012   Procedure: LEFT HEART CATHETERIZATION WITH CORONARY ANGIOGRAM;  Surgeon: Burnell Blanks, MD;  Location[facility] C[facility] LAB;  Service: Cardiovascular;  Laterality: N/A;    OB History    No data available       Home Medications    Prior to Admission medications   Medication Sig Start Date End Date Taking? Authorizing Provider  albuterol (PROVENTIL) (2.5 MG/3ML) 0.083% nebulizer solution Take 2.5 mg by nebulization every 6 (six) hours as needed for wheezing or shortness of breath.   Yes Historical Provider, MD  Calcium Carbonate-Vitamin D (CALCIUM 600+D)  600-200 MG-UNIT TABS Take 2 tablets by mouth daily.   Yes Historical Provider, MD  digoxin (LANOXIN) 0.125 MG tablet Take 0.0625 mg by mouth daily.   Yes Historical Provider, MD  diltiazem (CARDIZEM CD) 240 MG 24 hr capsule Take 1 capsule (240 mg total) by mouth daily. Patient taking differently: Take 240 mg by mouth every evening.  08/18/13  Yes Velvet Bathe, MD  famciclovir (FAMVIR) 250 MG tablet Take 1 tablet (250 mg total) by mouth daily. 08/03/16  Yes Eliezer Bottom,  NP  gabapentin (NEURONTIN) 600 MG tablet Take 1 tablet (600 mg total) by mouth 2 (two) times daily. 09/20/16  Yes Jennifer Chahn-Yang Choi, DO  insulin lispro (HUMALOG) 100 UNIT/ML injection Inject 0-15 Units into the skin 3 (three) times daily as needed for high blood sugar. Pt uses as needed per sliding scale:  70-120:  0 units, 121-150:  2 units, 151-200:  3 units, 201-250:  5 units, 251-300:  8 units, 301-350:  11 units, 351-400:  15 units, Greater than 400:  Call MD   Yes Historical Provider, MD  Lactobacillus Acid-Pectin (ACIDOPHILUS/CITRUS PECTIN) TABS Take 1 tablet by mouth every evening.    Yes Historical Provider, MD  metoprolol (LOPRESSOR) 100 MG tablet Take 1 tablet (100 mg total) by mouth 2 (two) times daily. 08/18/13  Yes Velvet Bathe, MD  morphine (KADIAN) 10 MG 24 hr capsule Take 10 mg by mouth 2 (two) times daily.   Yes Historical Provider, MD  Oxycodone HCl 10 MG TABS Take 10 mg by mouth 4 (four) times daily.   Yes Historical Provider, MD  pantoprazole (PROTONIX) 40 MG tablet Take 1 tablet (40 mg total) by mouth daily. 08/03/16  Yes Eliezer Bottom, NP  Polyethyl Glycol-Propyl Glycol (SYSTANE) 0.4-0.3 % SOLN Place 2 drops into both eyes 2 (two) times daily.   Yes Historical Provider, MD  potassium chloride SA (K-DUR,KLOR-CON) 20 MEQ tablet Take 1 tablet (20 mEq total) by mouth 2 (two) times daily. 08/28/13  Yes Liliane Shi, PA-C  predniSONE (DELTASONE) 20 MG tablet Please alternate 60 mg PO daily with 40 mg PO daily (example: Monday 60 mg, Tuesday 40 mg, Wednesday 60 mg, etc.) 01/09/17  Yes [place], NP  sitaGLIPtin (JANUVIA) 50 MG tablet Take 50 mg by mouth daily.   Yes Historical Provider, MD  sodium chloride (OCEAN) 0.65 % SOLN nasal spray Place 1 spray into both nostrils 3 (three) times daily.    Yes Historical Provider, MD  torsemide (DEMADEX) 20 MG tablet Take 4 tablets (80 mg total) by mouth 2 (two) times daily. Patient taking differently: Take 80 mg by mouth daily.   11/23/16  Yes Venetia Maxon Rama, MD    Family History Family History  Problem Relation Age of Onset  . CAD Father 94    Vague history     Social History Social History  Substance Use Topics  . Smoking status: Never Smoker  . Smokeless tobacco: Never Used  . Alcohol use No     Allergies   Keflex [cephalexin]   Review of Systems Review of Systems Cannot obtain due to patient condition a lot level V caveat confusion  Physical Exam Updated Vital Signs BP (!) 151/72   Pulse (!) 105   Temp 98.3 F (36.8 C) (Oral)   Resp (!) 23   Wt 197 lb (89.4 kg)   SpO2 93%   BMI 33.81 kg/m   Physical Exam  Constitutional:  Patient is very deconditioned in appearance.  She has mild to moderate increased work of breathing at rest. He is awake but slow to answer questions.  HENT:  Head: Normocephalic and atraumatic.  Mucous membranes dry.  Eyes: EOM are normal. Pupils are equal, round, and reactive to light.  Neck: Neck supple.  Cardiovascular:  Tachycardia irregularly irregular. 2\6 systolic ejection murmur  Pulmonary/Chest:  Mild to moderate work of breathing at rest. Very soft breath sounds at the bases. No gross rail or wheeze  Abdominal: Soft. Bowel sounds are normal. She exhibits no distension. There is no tenderness. There is no guarding.  Musculoskeletal:  Bilateral lower extremities hyperpigmentation skin changes consistent with chronic venous stasis. 1+ edema. Older wounds that are healed and dry. Patient did arrive with her compression socks on and with these removed skin condition is in fair condition without appearance of active venous ulcers or cellulitis.  Neurological:  On arrival patient is somewhat somnolent but awakens she follows commands for handgrip and moving legs.  Skin: Skin is warm and dry.  Psychiatric:  Somnolent, mildly confused.     ED Treatments / Results  Labs (all labs ordered are listed, but only abnormal results are displayed) Labs Reviewed    COMPREHENSIVE METABOLIC PANEL - Abnormal; Notable for the following:       Result Value   Chloride 97 (*)    Glucose, Bld 146 (*)    BUN 43 (*)    Creatinine, Ser 1.29 (*)    Calcium 8.7 (*)    Albumin 3.2 (*)    Total Bilirubin 1.6 (*)    GFR calc non Af Amer 37 (*)    GFR calc Af Amer 43 (*)    All other components within normal limits  CBC - Abnormal; Notable for the following:    WBC 15.1 (*)    RBC 5.57 (*)    HCT 48.3 (*)    MCH 25.7 (*)    MCHC 29.6 (*)    RDW 21.8 (*)    Platelets 95 (*)    All other components within normal limits  ACETAMINOPHEN LEVEL - Abnormal; Notable for the following:    Acetaminophen (Tylenol), Serum <10 (*)    All other components within normal limits  BRAIN NATRIURETIC PEPTIDE - Abnormal; Notable for the following:    B Natriuretic Peptide 428.7 (*)    All other components within normal limits  PROTIME-INR - Abnormal; Notable for the following:    Prothrombin Time 15.3 (*)    All other components within normal limits  BLOOD GAS, ARTERIAL - Abnormal; Notable for the following:    pH, Arterial 7.462 (*)    pO2, Arterial 57.6 (*)    Bicarbonate 31.1 (*)    Acid-Base Excess 6.8 (*)    All other components within normal limits  DIGOXIN LEVEL - Abnormal; Notable for the following:    Digoxin Level 0.7 (*)    All other components within normal limits  CBG MONITORING, ED - Abnormal; Notable for the following:    Glucose-Capillary 133 (*)    All other components within normal limits  URINE CULTURE  CULTURE, BLOOD (ROUTINE X 2)  CULTURE, BLOOD (ROUTINE X 2)  ETHANOL  LIPASE, BLOOD  SALICYLATE LEVEL  AMMONIA  URINALYSIS, ROUTINE W REFLEX MICROSCOPIC  RAPID URINE DRUG SCREEN, HOSP PERFORMED  I-STAT CG4 LACTIC ACID, ED  Randolm Idol, ED    EKG  EKG Interpretation  Date/Time:  Friday Jan 18 2017 13:59:33 EDT Ventricular Rate:  110 PR Interval:  QRS Duration: 91 QT Interval:  320 QTC Calculation: 447 R Axis:   -123 Text  Interpretation:  Atrial fibrillation Anteroseptal infarct, age indeterminate Lateral leads are also involved agree. no sig change from previous Confirmed by Johnney Killian, MD, [place] [phone]) on 01/18/2017 2:13:17 PM       Radiology Dg Chest Port 1 View  Result Date: 01/18/2017 CLINICAL DATA:  Hypoxia EXAM: PORTABLE CHEST 1 VIEW COMPARISON:  11/17/2016 FINDINGS: Low lung volumes with chronic interstitial prominence. Cardiomegaly with mild aortic atherosclerosis. No pneumonic consolidation, effusion or pneumothorax. Glenohumeral joint osteoarthritis bilaterally with remodeled appearance of the glenoid fossa and both humeral heads. Question chronic humeral neck impacted fractures bilaterally. Evidence prior spinal fusion along the visualized lower thoracic and upper lumbar spine. Neural stimulator lead projects over the mid thoracic spine. IMPRESSION: Stable cardiomegaly with chronic interstitial lung markings some of which may represent chronic mild interstitial edema. Electronically Signed   By: Ashley Royalty M.D.   On: 01/18/2017 15:13    Procedures Procedures (including critical care time)  Medications Ordered in ED Medications  0.9 %  sodium chloride infusion ( Intravenous New Bag/Given 01/18/17 1532)  pantoprazole (PROTONIX) injection 40 mg (40 mg Intravenous Given 01/18/17 1532)     Initial Impression / Assessment and Plan / ED Course  I have reviewed the triage vital signs and the nursing notes.  Pertinent labs & imaging results that were available during my care of the patient were reviewed by me and considered in my medical decision making (see chart for details).      Consult: [facility] for admission. Final Clinical Impressions(s) / ED Diagnoses   Final diagnoses:  Somnolence  Chronic respiratory failure with hypoxia and hypercapnia ([place])   Patient has shown signs of improvement since her arrival to the emergency department. He has multiple severe chronic medical problems.  Considerations for possible overmedication which is now resolving versus exacerbation of chronic respiratory failure with hypoxia. Plan will be for observation to determine the patient will return to normal mental status and baseline function. New Prescriptions New Prescriptions   No medications on file     Charlesetta Shanks, MD 01/18/17 [phone]

## 2017-01-19 ENCOUNTER — Observation Stay (HOSPITAL_COMMUNITY): Payer: Medicare Other

## 2017-01-19 DIAGNOSIS — D693 Immune thrombocytopenic purpura: Secondary | ICD-10-CM | POA: Diagnosis not present

## 2017-01-19 DIAGNOSIS — R400 Somnolence: Secondary | ICD-10-CM | POA: Diagnosis present

## 2017-01-19 DIAGNOSIS — I5032 Chronic diastolic (congestive) heart failure: Secondary | ICD-10-CM | POA: Diagnosis not present

## 2017-01-19 DIAGNOSIS — I25810 Atherosclerosis of coronary artery bypass graft(s) without angina pectoris: Secondary | ICD-10-CM | POA: Diagnosis present

## 2017-01-19 DIAGNOSIS — J9612 Chronic respiratory failure with hypercapnia: Secondary | ICD-10-CM | POA: Diagnosis not present

## 2017-01-19 DIAGNOSIS — Z794 Long term (current) use of insulin: Secondary | ICD-10-CM | POA: Diagnosis not present

## 2017-01-19 DIAGNOSIS — G8929 Other chronic pain: Secondary | ICD-10-CM | POA: Diagnosis present

## 2017-01-19 DIAGNOSIS — J9611 Chronic respiratory failure with hypoxia: Secondary | ICD-10-CM | POA: Diagnosis not present

## 2017-01-19 DIAGNOSIS — G92 Toxic encephalopathy: Secondary | ICD-10-CM | POA: Diagnosis present

## 2017-01-19 DIAGNOSIS — N183 Chronic kidney disease, stage 3 (moderate): Secondary | ICD-10-CM | POA: Diagnosis present

## 2017-01-19 DIAGNOSIS — I130 Hypertensive heart and chronic kidney disease with heart failure and stage 1 through stage 4 chronic kidney disease, or unspecified chronic kidney disease: Secondary | ICD-10-CM | POA: Diagnosis present

## 2017-01-19 DIAGNOSIS — E1122 Type 2 diabetes mellitus with diabetic chronic kidney disease: Secondary | ICD-10-CM | POA: Diagnosis present

## 2017-01-19 DIAGNOSIS — I83009 Varicose veins of unspecified lower extremity with ulcer of unspecified site: Secondary | ICD-10-CM | POA: Diagnosis present

## 2017-01-19 DIAGNOSIS — J9621 Acute and chronic respiratory failure with hypoxia: Secondary | ICD-10-CM | POA: Diagnosis not present

## 2017-01-19 DIAGNOSIS — Z66 Do not resuscitate: Secondary | ICD-10-CM | POA: Diagnosis present

## 2017-01-19 DIAGNOSIS — R7989 Other specified abnormal findings of blood chemistry: Secondary | ICD-10-CM | POA: Diagnosis not present

## 2017-01-19 DIAGNOSIS — R0902 Hypoxemia: Secondary | ICD-10-CM

## 2017-01-19 DIAGNOSIS — T402X1A Poisoning by other opioids, accidental (unintentional), initial encounter: Secondary | ICD-10-CM | POA: Diagnosis present

## 2017-01-19 DIAGNOSIS — F1120 Opioid dependence, uncomplicated: Secondary | ICD-10-CM | POA: Diagnosis present

## 2017-01-19 DIAGNOSIS — I872 Venous insufficiency (chronic) (peripheral): Secondary | ICD-10-CM | POA: Diagnosis not present

## 2017-01-19 DIAGNOSIS — I2510 Atherosclerotic heart disease of native coronary artery without angina pectoris: Secondary | ICD-10-CM | POA: Diagnosis present

## 2017-01-19 DIAGNOSIS — E785 Hyperlipidemia, unspecified: Secondary | ICD-10-CM | POA: Diagnosis present

## 2017-01-19 DIAGNOSIS — I10 Essential (primary) hypertension: Secondary | ICD-10-CM | POA: Diagnosis not present

## 2017-01-19 DIAGNOSIS — I4891 Unspecified atrial fibrillation: Secondary | ICD-10-CM | POA: Diagnosis present

## 2017-01-19 DIAGNOSIS — Z9981 Dependence on supplemental oxygen: Secondary | ICD-10-CM | POA: Diagnosis not present

## 2017-01-19 DIAGNOSIS — Z79899 Other long term (current) drug therapy: Secondary | ICD-10-CM | POA: Diagnosis not present

## 2017-01-19 DIAGNOSIS — I482 Chronic atrial fibrillation: Secondary | ICD-10-CM | POA: Diagnosis not present

## 2017-01-19 DIAGNOSIS — R198 Other specified symptoms and signs involving the digestive system and abdomen: Secondary | ICD-10-CM | POA: Diagnosis not present

## 2017-01-19 DIAGNOSIS — R410 Disorientation, unspecified: Secondary | ICD-10-CM | POA: Diagnosis not present

## 2017-01-19 DIAGNOSIS — I5033 Acute on chronic diastolic (congestive) heart failure: Secondary | ICD-10-CM | POA: Diagnosis present

## 2017-01-19 DIAGNOSIS — Y92129 Unspecified place in nursing home as the place of occurrence of the external cause: Secondary | ICD-10-CM | POA: Diagnosis not present

## 2017-01-19 LAB — BASIC METABOLIC PANEL
ANION GAP: 10 (ref 5–15)
BUN: 24 mg/dL — ABNORMAL HIGH (ref 6–20)
CALCIUM: 8.3 mg/dL — AB (ref 8.9–10.3)
CO2: 31 mmol/L (ref 22–32)
CREATININE: 1.06 mg/dL — AB (ref 0.44–1.00)
Chloride: 103 mmol/L (ref 101–111)
GFR, EST AFRICAN AMERICAN: 55 mL/min — AB (ref >60)
GFR, EST NON AFRICAN AMERICAN: 47 mL/min — AB (ref >60)
Glucose, Bld: 225 mg/dL — ABNORMAL HIGH (ref 65–99)
Potassium: 3.4 mmol/L — ABNORMAL LOW (ref 3.5–5.1)
SODIUM: 144 mmol/L (ref 135–145)

## 2017-01-19 LAB — D-DIMER, QUANTITATIVE (NOT AT ARMC): D DIMER QUANT: 0.75 ug{FEU}/mL — AB (ref 0.00–0.50)

## 2017-01-19 LAB — GLUCOSE, CAPILLARY
GLUCOSE-CAPILLARY: 333 mg/dL — AB (ref 65–99)
GLUCOSE-CAPILLARY: 291 mg/dL — AB (ref 65–99)

## 2017-01-19 MED ORDER — FUROSEMIDE 10 MG/ML IJ SOLN
80.0000 mg | Freq: Two times a day (BID) | INTRAMUSCULAR | Status: DC
  Administered 2017-01-19 – 2017-01-20 (×4): 80 mg via INTRAVENOUS
  Filled 2017-01-19 (×4): qty 8

## 2017-01-19 MED ORDER — INSULIN ASPART 100 UNIT/ML ~~LOC~~ SOLN
0.0000 [IU] | Freq: Every day | SUBCUTANEOUS | Status: DC
  Administered 2017-01-19: 3 [IU] via SUBCUTANEOUS
  Administered 2017-01-21: 2 [IU] via SUBCUTANEOUS

## 2017-01-19 MED ORDER — IOPAMIDOL (ISOVUE-370) INJECTION 76%
INTRAVENOUS | Status: AC
  Administered 2017-01-19: 75 mL via INTRAVENOUS
  Filled 2017-01-19: qty 100

## 2017-01-19 MED ORDER — INSULIN ASPART 100 UNIT/ML ~~LOC~~ SOLN
0.0000 [IU] | Freq: Three times a day (TID) | SUBCUTANEOUS | Status: DC
  Administered 2017-01-19: 7 [IU] via SUBCUTANEOUS
  Administered 2017-01-20: 3 [IU] via SUBCUTANEOUS
  Administered 2017-01-20: 2 [IU] via SUBCUTANEOUS
  Administered 2017-01-20 – 2017-01-21 (×2): 5 [IU] via SUBCUTANEOUS
  Administered 2017-01-21: 2 [IU] via SUBCUTANEOUS
  Administered 2017-01-21: 5 [IU] via SUBCUTANEOUS
  Administered 2017-01-22: 3 [IU] via SUBCUTANEOUS
  Administered 2017-01-22: 2 [IU] via SUBCUTANEOUS

## 2017-01-19 NOTE — Progress Notes (Signed)
PROGRESS NOTE    [name] [name]  VZD:[id number] DOB: Dec 21, 1930 DOA: 01/18/2017 PCP: Lajean Manes, MD   Brief Narrative: [name] [name] is a 83 y.o. female with medical history significant of atrial fibrillation, diastolic heart failure, chronic ITP, hypertension, CKD stage III. He presented with altered mental status which is only improved slightly. She also has some acute on chronic wrist or failure. Currently, there are no obvious reasons for her deterioration other than possibly some mild fluid overload. CTA PE pending.   Assessment & Plan:   Active Problems:   HTN (hypertension)   CAD (coronary artery disease) of artery bypass graft   Chronic narcotic dependence (HCC)   Atrial fibrillation (HCC)   Chronic ITP (idiopathic thrombocytopenia) (HCC)   Stage III chronic kidney disease   Venous stasis dermatitis of both lower extremities   Altered mental status   Chronic diastolic heart failure (HCC)   Chronic respiratory failure with hypoxia (HCC)   Altered mental status Possibly secondary to medications. Ammonia negative. Head CT negative. BUN stable. Minimal improvement overnight. -continue to hold morphine and oxycodone -frequent neuro checks  Abdominal pain Appears to be resolved today. CT abdomen was unremarkable for etiology for pain.  Atrial fibrillation Slightly hypertensive. -continue home Cardizem and metoprolol -continue telemetry  Acute on chronic respiratory failure Uses 4 L at baseline. Patient has been increased to 10 L high flow nasal cannula. D-dimer elevated at 0.75. Wells PE score of 3. -Continue oxygen therapy -Aggressive diuresis -CTA PE  Essential hypertension -continue diltiazem and metoprolol   ITP Platelets stable. -continue prednisone  Chronic diastolic heart failure Last EF of 65-70% without mention of diastolic dysfunction on echo from 11/18/2016. Echo in 10/18/2012 significant for grade 1 diastolic dysfunction.  Euvolemic. -daily weights -strict in/out -Continue metoprolol -Continue digoxin -Continue lasix and potassium   Diabetes mellitus -Continue Januvia -SSI  Stage III CKD Stable.  Venous stasis ulcers No evidence of infection  Chronic pain Hold narcotics secondary to altered mental status   DVT prophylaxis: SCDs Code Status: DNR Family Communication: Discussed with son over the phone Disposition Plan: Pending continued medical workup   Consultants:   None  Procedures:   None  Antimicrobials:  None    Subjective: Patient reports no chest pain, dyspnea, abdominal pain. She is not answering most questions appropriately.  Objective: Vitals:   01/19/17 0234 01/19/17 0308 01/19/17 0319 01/19/17 0500  BP: (!) 153/80     Pulse: (!) 112     Resp: (!) 22     Temp:      TempSrc:      SpO2: 90% (!) 87% 92%   Weight:    92.6 kg (204 lb 2.3 oz)  Height:       No intake or output data in the 24 hours ending 01/19/17 0724 Filed Weights   01/18/17 1400 01/18/17 1900 01/19/17 0500  Weight: 89.4 kg (197 lb) 90.5 kg (199 lb 8.3 oz) 92.6 kg (204 lb 2.3 oz)    Examination:  General exam: Appears calm and comfortable Respiratory system: Diminished bilaterally. Respiratory effort normal. Cardiovascular system: S1 & S2 heard, RRR. No murmurs. Gastrointestinal system: Abdomen is nondistended, soft and nontender. Normal bowel sounds heard. Central nervous system: Arouses to voice commands. Oriented to person and place. No focal neurological deficits. Arms appear flaccid, however with noxious stimuli, patient is able to spontaneously move her arms. Extremities: No edema. No calf tenderness Skin: No cyanosis. Bilateral lower extremity edema secondary to venous stasis Psychiatry: Judgement and  insight appear impaired. Mood & affect flat and depressed.    Data Reviewed: I have personally reviewed following labs and imaging studies  CBC:  Recent Labs Lab 01/18/17 1435   WBC 15.1*  HGB 14.3  HCT 48.3*  MCV 86.7  PLT 95*   Basic Metabolic Panel:  Recent Labs Lab 01/18/17 1435  NA 139  K 4.2  CL 97*  CO2 31  GLUCOSE 146*  BUN 43*  CREATININE 1.29*  CALCIUM 8.7*   GFR: Estimated Creatinine Clearance: 35.7 mL/min (A) (by C-G formula based on SCr of 1.29 mg/dL (H)). Liver Function Tests:  Recent Labs Lab 01/18/17 1435  AST 27  ALT 15  ALKPHOS 75  BILITOT 1.6*  PROT 6.8  ALBUMIN 3.2*    Recent Labs Lab 01/18/17 1518  LIPASE 21    Recent Labs Lab 01/18/17 1524  AMMONIA 28   Coagulation Profile:  Recent Labs Lab 01/18/17 1518  INR 1.20   Cardiac Enzymes: No results for input(s): CKTOTAL, CKMB, CKMBINDEX, TROPONINI in the last 168 hours. BNP (last 3 results) No results for input(s): PROBNP in the last 8760 hours. HbA1C: No results for input(s): HGBA1C in the last 72 hours. CBG:  Recent Labs Lab 01/18/17 1415  GLUCAP 133*   Lipid Profile: No results for input(s): CHOL, HDL, LDLCALC, TRIG, CHOLHDL, LDLDIRECT in the last 72 hours. Thyroid Function Tests: No results for input(s): TSH, T4TOTAL, FREET4, T3FREE, THYROIDAB in the last 72 hours. Anemia Panel: No results for input(s): VITAMINB12, FOLATE, FERRITIN, TIBC, IRON, RETICCTPCT in the last 72 hours. Sepsis Labs:  Recent Labs Lab 01/18/17 1557  LATICACIDVEN 1.43    Recent Results (from the past 240 hour(s))  MRSA PCR Screening     Status: Abnormal   Collection Time: 01/18/17  7:26 PM  Result Value Ref Range Status   MRSA by PCR POSITIVE (A) NEGATIVE Final    Comment:        The GeneXpert MRSA Assay (FDA approved for NASAL specimens only), is one component of a comprehensive MRSA colonization surveillance program. It is not intended to diagnose MRSA infection nor to guide or monitor treatment for MRSA infections. RESULT CALLED TO, READ BACK BY AND VERIFIED WITH: M.BRYANT,RN 2259 01/18/17 W.SHEA          Radiology Studies: Ct Abdomen Pelvis  Wo Contrast  Result Date: 01/18/2017 CLINICAL DATA:  83 year old female with history of atrial fibrillation and diastolic heart failure presenting with possible drug overdose. EXAM: CT ABDOMEN AND PELVIS WITHOUT CONTRAST TECHNIQUE: Multidetector CT imaging of the abdomen and pelvis was performed following the standard protocol without IV contrast. COMPARISON:  CT the abdomen and pelvis 12/20/2012. FINDINGS: Lower chest: Spinal cord stimulator in the lower thoracic spinal column. Atherosclerotic calcifications in the left main, left anterior descending, left circumflex and right coronary arteries. Coronary artery stent in the left anterior descending coronary artery. Cardiomegaly with biatrial dilatation. Hepatobiliary: No definite cystic or solid hepatic lesions are identified on today's noncontrast CT examination. Multiple partially calcified gallstones lie dependently in the gallbladder. No findings to suggest an acute cholecystitis at this time. Pancreas: No definite pancreatic mass or peripancreatic inflammatory changes are noted on today's noncontrast CT examination. Spleen: Unremarkable. Adrenals/Urinary Tract: Unenhanced appearance of the kidneys and bilateral adrenal glands is unremarkable. No hydroureteronephrosis. Urinary bladder is moderately distended, but otherwise unremarkable in appearance. Stomach/Bowel: Unenhanced appearance of the stomach is normal. There is no pathologic dilatation of small bowel or colon. The appendix is not confidently identified and may  be surgically absent. Regardless, there are no inflammatory changes noted adjacent to the cecum to suggest the presence of an acute appendicitis at this time. Vascular/Lymphatic: Aortic atherosclerosis, without definite aneurysm in the abdominal or pelvic vasculature on today's noncontrast CT examination. No lymphadenopathy noted in the abdomen or pelvis. Reproductive: Uterus and ovaries are atrophic. Other: Small umbilical hernia containing  omental fat. No significant volume of ascites. No pneumoperitoneum. Musculoskeletal: Orthopedic fixation hardware throughout the lumbar spine. There are no aggressive appearing lytic or blastic lesions noted in the visualized portions of the skeleton. Status post right hip arthroplasty. IMPRESSION: 1. No acute findings are noted in the abdomen or pelvis. 2. Aortic atherosclerosis, in addition to left main and 3 vessel coronary artery disease. 3. Small umbilical hernia containing only omental fat. 4. Additional incidental findings, as above. Electronically Signed   By: Vinnie Langton M.D.   On: 01/18/2017 19:54   Ct Head Wo Contrast  Result Date: 01/18/2017 CLINICAL DATA:  Atrial fibrillation EXAM: CT HEAD WITHOUT CONTRAST TECHNIQUE: Contiguous axial images were obtained from the base of the skull through the vertex without intravenous contrast. COMPARISON:  07/26/2017 FINDINGS: Brain: There is prominence of the sulci and ventricles compatible with brain atrophy. Periventricular and subcortical low attenuation compatible with chronic small vessel ischemic change. Vascular: No hyperdense vessel or unexpected calcification. Skull: Normal. Negative for fracture or focal lesion. Sinuses/Orbits: No acute finding. Other: None. IMPRESSION: 1. No acute intracranial abnormalities. 2. Chronic small vessel ischemic change with brain atrophy Electronically Signed   By: Kerby Moors M.D.   On: 01/18/2017 20:56   Dg Chest Port 1 View  Result Date: 01/18/2017 CLINICAL DATA:  Hypoxia EXAM: PORTABLE CHEST 1 VIEW COMPARISON:  11/17/2016 FINDINGS: Low lung volumes with chronic interstitial prominence. Cardiomegaly with mild aortic atherosclerosis. No pneumonic consolidation, effusion or pneumothorax. Glenohumeral joint osteoarthritis bilaterally with remodeled appearance of the glenoid fossa and both humeral heads. Question chronic humeral neck impacted fractures bilaterally. Evidence prior spinal fusion along the visualized  lower thoracic and upper lumbar spine. Neural stimulator lead projects over the mid thoracic spine. IMPRESSION: Stable cardiomegaly with chronic interstitial lung markings some of which may represent chronic mild interstitial edema. Electronically Signed   By: Ashley Royalty M.D.   On: 01/18/2017 15:13        Scheduled Meds: . calcium-vitamin D  2 tablet Oral Daily  . digoxin  0.0625 mg Oral Daily  . diltiazem  240 mg Oral QPM  . furosemide  80 mg Intravenous BID  . linagliptin  5 mg Oral Daily  . pantoprazole  40 mg Oral Daily  . polyvinyl alcohol  2 drop Both Eyes BID  . potassium chloride SA  20 mEq Oral BID  . predniSONE  40 mg Oral Q48H  . [START ON 01/20/2017] predniSONE  60 mg Oral Q48H  . sodium chloride flush  3 mL Intravenous Q12H  . valACYclovir  500 mg Oral Daily   Continuous Infusions:   LOS: 0 days     Cordelia Poche, MD [facility] 01/19/2017, 7:24 AM Pager: [phone]  If 7PM-7AM, please contact night-coverage [email or web address] Password [email or web address] 01/19/2017, 7:24 AM

## 2017-01-19 NOTE — Care Management Note (Signed)
[place] NOTIFICATION   Patient Details  Name: [name] [name] MRN: [id number] Date of Birth: Feb 02, 1931   Medicare Observation Status Notification Given:   yes    Norina Buzzard, RN 01/19/2017, 12:56 PM

## 2017-01-20 LAB — GLUCOSE, CAPILLARY
GLUCOSE-CAPILLARY: 261 mg/dL — AB (ref 65–99)
GLUCOSE-CAPILLARY: 227 mg/dL — AB (ref 65–99)
GLUCOSE-CAPILLARY: 169 mg/dL — AB (ref 65–99)
Glucose-Capillary: 176 mg/dL — ABNORMAL HIGH (ref 65–99)

## 2017-01-20 LAB — BASIC METABOLIC PANEL
Anion gap: 9 (ref 5–15)
BUN: 21 mg/dL — ABNORMAL HIGH (ref 6–20)
CALCIUM: 8.6 mg/dL — AB (ref 8.9–10.3)
CHLORIDE: 103 mmol/L (ref 101–111)
CO2: 31 mmol/L (ref 22–32)
Creatinine, Ser: 0.95 mg/dL (ref 0.44–1.00)
GFR calc Af Amer: >60 mL/min (ref >60)
GFR calc non Af Amer: 54 mL/min — ABNORMAL LOW (ref >60)
GLUCOSE: 191 mg/dL — AB (ref 65–99)
Potassium: 3.7 mmol/L (ref 3.5–5.1)
Sodium: 143 mmol/L (ref 135–145)

## 2017-01-20 LAB — CBC
HEMATOCRIT: 43.8 % (ref 36.0–46.0)
HEMATOCRIT: 44.6 % (ref 36.0–46.0)
HEMOGLOBIN: 12.4 g/dL (ref 12.0–15.0)
Hemoglobin: 13.2 g/dL (ref 12.0–15.0)
MCH: 24.8 pg — AB (ref 26.0–34.0)
MCH: 25 pg — ABNORMAL LOW (ref 26.0–34.0)
MCHC: 28.3 g/dL — AB (ref 30.0–36.0)
MCHC: 29.6 g/dL — ABNORMAL LOW (ref 30.0–36.0)
MCV: 87.4 fL (ref 78.0–100.0)
MCV: 84.6 fL (ref 78.0–100.0)
Platelets: 28 10*3/uL — CL (ref 150–400)
Platelets: 28 10*3/uL — CL (ref 150–400)
RBC: 5.01 MIL/uL (ref 3.87–5.11)
RBC: 5.27 MIL/uL — AB (ref 3.87–5.11)
RDW: 22.1 % — ABNORMAL HIGH (ref 11.5–15.5)
RDW: 22.3 % — AB (ref 11.5–15.5)
WBC: 10.8 10*3/uL — ABNORMAL HIGH (ref 4.0–10.5)
WBC: 20.6 10*3/uL — AB (ref 4.0–10.5)

## 2017-01-20 LAB — URINE CULTURE

## 2017-01-20 MED ORDER — CIPROFLOXACIN HCL 250 MG PO TABS
250.0000 mg | ORAL_TABLET | Freq: Two times a day (BID) | ORAL | Status: DC
  Administered 2017-01-20 – 2017-01-22 (×5): 250 mg via ORAL
  Filled 2017-01-20 (×5): qty 1

## 2017-01-20 MED ORDER — OXYCODONE HCL 5 MG PO TABS
10.0000 mg | ORAL_TABLET | Freq: Four times a day (QID) | ORAL | Status: DC | PRN
  Administered 2017-01-20 – 2017-01-22 (×6): 10 mg via ORAL
  Filled 2017-01-20 (×6): qty 2

## 2017-01-20 NOTE — Progress Notes (Signed)
CRITICAL VALUE ALERT  Critical value received:  Platelet 28  Date of notification:  01/20/2017  Time of notification: 0635  Critical value read back:Yes.    Nurse who received alert:  Clementeen Hoof  MD notified (1st page):  J.Kim, MD  Time of first page:  [phone]  MD notified (2nd page):  Time of second page:  Responding MD:  Georges Mouse, MD  Time MD responded:  [phone]

## 2017-01-20 NOTE — Progress Notes (Signed)
PROGRESS NOTE    [name] [name]  YDX:[id number] DOB: [birth year] DOA: 01/18/2017 PCP: Lajean Manes, MD   Brief Narrative: [name] [name] is a 83 y.o. female with medical history significant of atrial fibrillation, diastolic heart failure, chronic ITP, hypertension, CKD stage III. He presented with altered mental status which is only improved slightly. She also has some acute on chronic wrist or failure. Currently, there are no obvious reasons for her deterioration other than possibly some mild fluid overload. CTA PE pending.   Assessment & Plan:   Active Problems:   HTN (hypertension)   CAD (coronary artery disease) of artery bypass graft   Chronic narcotic dependence (HCC)   Atrial fibrillation (HCC)   Chronic ITP (idiopathic thrombocytopenia) (HCC)   Stage III chronic kidney disease   Venous stasis dermatitis of both lower extremities   Altered mental status   Chronic diastolic heart failure (HCC)   Chronic respiratory failure with hypoxia (HCC)   Altered mental status Improved today. Per discussion with son yesterday, seems likely to be at baseline. -continue to hold morphine -restart oxycodone 42m as prn (previously scheduled) -frequent neuro checks  Dysuria New. Patient had urine culture on admission which was significant for multiple organisms. -repeat urine culture ciprofloxacin   Abdominal pain Resolved. Unknown etiology. CT unremarkable for etiology.  Atrial fibrillation Moderately rate controlled -continue home Cardizem and metoprolol -continue telemetry  Acute on chronic respiratory failure Uses 4 L at baseline. Patient has been increased to 10 L high flow nasal cannula. CTA negative. Vascular congestion present. -Continue oxygen therapy -Aggressive diuresis  Essential hypertension -continue diltiazem and metoprolol   ITP Platelets dropped. Possibly secondary to lasix. -recheck this afternoon -continue prednisone  Chronic diastolic heart  failure Last EF of 65-70% without mention of diastolic dysfunction on echo from 11/18/2016. Echo in 10/18/2012 significant for grade 1 diastolic dysfunction. Euvolemic. -daily weights -strict in/out -Continue metoprolol -Continue digoxin -Continue lasix and potassium   Diabetes mellitus Hemoglobin A1C of 5.8 in 2014. Blood sugar is adequate currently -Continue Januvia -SSI  Stage III CKD Stable. Creatinine improving with diuresis.  Venous stasis ulcers No evidence of infection  Chronic pain -hold morphine -restart oxycodone prn   DVT prophylaxis: SCDs Code Status: DNR Family Communication: None at bedside Disposition Plan: Pending continued medical workup   Consultants:   None  Procedures:   None  Antimicrobials:  None    Subjective: Patient reports significant dysuria today. No chest pain or dyspnea. Dysuria made worse with frequent urination from diuresis. Afebrile. No hematochezia, hematemesis, oozing.  Objective: Vitals:   01/19/17 1433 01/19/17 1849 01/19/17 2207 01/20/17 0300  BP: (!) 153/77 (!) 163/90 129/74 (!) 161/63  Pulse: (!) 106  99 (!) 108  Resp: 20     Temp: 97.7 F (36.5 C)  98.3 F (36.8 C) 97.9 F (36.6 C)  TempSrc: Oral  Oral Oral  SpO2: 93%  93% 99%  Weight:    90 kg (198 lb 6.6 oz)  Height:        Intake/Output Summary (Last 24 hours) at 01/20/17 0839 Last data filed at 01/20/17 0200  Gross per 24 hour  Intake              480 ml  Output                0 ml  Net              480 ml   Filed Weights   01/18/17 1900 01/19/17  0500 01/20/17 0300  Weight: 90.5 kg (199 lb 8.3 oz) 92.6 kg (204 lb 2.3 oz) 90 kg (198 lb 6.6 oz)    Examination:  General exam: Appears calm and comfortable Respiratory system: Diminished bilaterally. Crackles heard at bases. Respiratory effort normal. Cardiovascular system: S1 & S2 heard, RRR. No murmurs. Gastrointestinal system: Abdomen is nondistended, soft and nontender. Normal bowel sounds  heard. Central nervous system: Alert and oriented to person and place. Follows commands. Extremities: No edema. No calf tenderness Skin: No cyanosis. Bilateral lower extremity edema secondary to venous stasis. Pitting edema. Psychiatry: Judgement and insight appear impaired. Mood & affect flat and depressed.    Data Reviewed: I have personally reviewed following labs and imaging studies  CBC:  Recent Labs Lab 01/18/17 1435 01/20/17 0511  WBC 15.1* 10.8*  HGB 14.3 12.4  HCT 48.3* 43.8  MCV 86.7 87.4  PLT 95* 28*   Basic Metabolic Panel:  Recent Labs Lab 01/18/17 1435 01/19/17 1122 01/20/17 0511  NA 139 144 143  K 4.2 3.4* 3.7  CL 97* 103 103  CO2 _0 GLUCOSE 146* 225* 191*  BUN 43* 24* 21*  CREATININE 1.29* 1.06* 0.95  CALCIUM 8.7* 8.3* 8.6*   GFR: Estimated Creatinine Clearance: 47.7 mL/min (by C-G formula based on SCr of 0.95 mg/dL). Liver Function Tests:  Recent Labs Lab 01/18/17 1435  AST 27  ALT 15  ALKPHOS 75  BILITOT 1.6*  PROT 6.8  ALBUMIN 3.2*    Recent Labs Lab 01/18/17 1518  LIPASE 21    Recent Labs Lab 01/18/17 1524  AMMONIA 28   Coagulation Profile:  Recent Labs Lab 01/18/17 1518  INR 1.20   Cardiac Enzymes: No results for input(s): CKTOTAL, CKMB, CKMBINDEX, TROPONINI in the last 168 hours. BNP (last 3 results) No results for input(s): PROBNP in the last 8760 hours. HbA1C: No results for input(s): HGBA1C in the last 72 hours. CBG:  Recent Labs Lab 01/18/17 1415 01/19/17 1646 01/19/17 2132 01/20/17 0726  GLUCAP 133* 333* 291* 176*   Lipid Profile: No results for input(s): CHOL, HDL, LDLCALC, TRIG, CHOLHDL, LDLDIRECT in the last 72 hours. Thyroid Function Tests: No results for input(s): TSH, T4TOTAL, FREET4, T3FREE, THYROIDAB in the last 72 hours. Anemia Panel: No results for input(s): VITAMINB12, FOLATE, FERRITIN, TIBC, IRON, RETICCTPCT in the last 72 hours. Sepsis Labs:  Recent Labs Lab 01/18/17 1557    LATICACIDVEN 1.43    Recent Results (from the past 240 hour(s))  Culture, blood (routine x 2)     Status: None (Preliminary result)   Collection Time: 01/18/17  2:58 PM  Result Value Ref Range Status   Specimen Description BLOOD RIGHT FOREARM  Final   Special Requests   Final    BOTTLES DRAWN AEROBIC AND ANAEROBIC Blood Culture adequate volume   Culture   Final    NO GROWTH < 24 HOURS Performed at [facility], [place] [address], [place], [place] [address]    Report Status PENDING  Incomplete  Culture, blood (routine x 2)     Status: None (Preliminary result)   Collection Time: 01/18/17  3:24 PM  Result Value Ref Range Status   Specimen Description BLOOD LEFT ANTECUBITAL  Final   Special Requests   Final    BOTTLES DRAWN AEROBIC AND ANAEROBIC Blood Culture adequate volume   Culture   Final    NO GROWTH < 24 HOURS Performed at [facility], [place] [address] [place], [place] [address]  Report Status PENDING  Incomplete  MRSA PCR Screening     Status: Abnormal   Collection Time: 01/18/17  7:26 PM  Result Value Ref Range Status   MRSA by PCR POSITIVE (A) NEGATIVE Final    Comment:        The GeneXpert MRSA Assay (FDA approved for NASAL specimens only), is one component of a comprehensive MRSA colonization surveillance program. It is not intended to diagnose MRSA infection nor to guide or monitor treatment for MRSA infections. RESULT CALLED TO, READ BACK BY AND VERIFIED WITH: M.BRYANT,RN 2259 01/18/17 W.SHEA   Urine culture     Status: Abnormal   Collection Time: 01/18/17  8:58 PM  Result Value Ref Range Status   Specimen Description URINE, RANDOM  Final   Special Requests NONE  Final   Culture MULTIPLE SPECIES PRESENT, SUGGEST RECOLLECTION (A)  Final   Report Status 01/20/2017 FINAL  Final         Radiology Studies: Ct Abdomen Pelvis Wo Contrast  Result Date: 01/18/2017 CLINICAL DATA:  83 year old female with history of atrial fibrillation and diastolic  heart failure presenting with possible drug overdose. EXAM: CT ABDOMEN AND PELVIS WITHOUT CONTRAST TECHNIQUE: Multidetector CT imaging of the abdomen and pelvis was performed following the standard protocol without IV contrast. COMPARISON:  CT the abdomen and pelvis 12/20/2012. FINDINGS: Lower chest: Spinal cord stimulator in the lower thoracic spinal column. Atherosclerotic calcifications in the left main, left anterior descending, left circumflex and right coronary arteries. Coronary artery stent in the left anterior descending coronary artery. Cardiomegaly with biatrial dilatation. Hepatobiliary: No definite cystic or solid hepatic lesions are identified on today's noncontrast CT examination. Multiple partially calcified gallstones lie dependently in the gallbladder. No findings to suggest an acute cholecystitis at this time. Pancreas: No definite pancreatic mass or peripancreatic inflammatory changes are noted on today's noncontrast CT examination. Spleen: Unremarkable. Adrenals/Urinary Tract: Unenhanced appearance of the kidneys and bilateral adrenal glands is unremarkable. No hydroureteronephrosis. Urinary bladder is moderately distended, but otherwise unremarkable in appearance. Stomach/Bowel: Unenhanced appearance of the stomach is normal. There is no pathologic dilatation of small bowel or colon. The appendix is not confidently identified and may be surgically absent. Regardless, there are no inflammatory changes noted adjacent to the cecum to suggest the presence of an acute appendicitis at this time. Vascular/Lymphatic: Aortic atherosclerosis, without definite aneurysm in the abdominal or pelvic vasculature on today's noncontrast CT examination. No lymphadenopathy noted in the abdomen or pelvis. Reproductive: Uterus and ovaries are atrophic. Other: Small umbilical hernia containing omental fat. No significant volume of ascites. No pneumoperitoneum. Musculoskeletal: Orthopedic fixation hardware throughout  the lumbar spine. There are no aggressive appearing lytic or blastic lesions noted in the visualized portions of the skeleton. Status post right hip arthroplasty. IMPRESSION: 1. No acute findings are noted in the abdomen or pelvis. 2. Aortic atherosclerosis, in addition to left main and 3 vessel coronary artery disease. 3. Small umbilical hernia containing only omental fat. 4. Additional incidental findings, as above. Electronically Signed   By: Vinnie Langton M.D.   On: 01/18/2017 19:54   Ct Head Wo Contrast  Result Date: 01/18/2017 CLINICAL DATA:  Atrial fibrillation EXAM: CT HEAD WITHOUT CONTRAST TECHNIQUE: Contiguous axial images were obtained from the base of the skull through the vertex without intravenous contrast. COMPARISON:  07/26/2017 FINDINGS: Brain: There is prominence of the sulci and ventricles compatible with brain atrophy. Periventricular and subcortical low attenuation compatible with chronic small vessel ischemic change. Vascular: No hyperdense vessel or  unexpected calcification. Skull: Normal. Negative for fracture or focal lesion. Sinuses/Orbits: No acute finding. Other: None. IMPRESSION: 1. No acute intracranial abnormalities. 2. Chronic small vessel ischemic change with brain atrophy Electronically Signed   By: Kerby Moors M.D.   On: 01/18/2017 20:56   Ct Angio Chest Pe W Or Wo Contrast  Result Date: 01/19/2017 CLINICAL DATA:  Hypoxia and elevated D-dimer. EXAM: CT ANGIOGRAPHY CHEST WITH CONTRAST TECHNIQUE: Multidetector CT imaging of the chest was performed using the standard protocol during bolus administration of intravenous contrast. Multiplanar CT image reconstructions and MIPs were obtained to evaluate the vascular anatomy. CONTRAST:  75 mL Isovue 370 IV COMPARISON:  Chest x-ray earlier today. FINDINGS: Cardiovascular: Satisfactory opacification of the pulmonary arteries to the segmental level. No evidence of pulmonary embolism. The heart is mildly enlarged. There is a stent  visible in the LAD. No pericardial effusion. Mediastinum/Nodes: No enlarged mediastinal, hilar, or axillary lymph nodes. Thyroid gland, trachea, and esophagus demonstrate no significant findings. Lungs/Pleura: Lungs show diffuse interstitial thickening, pulmonary venous hypertension and areas of ground-glass airspace opacity. Findings are consistent with pulmonary interstitial edema and congestive heart failure. No significant pleural fluid identified. No focal airspace consolidation, pneumothorax or nodule identified. Upper Abdomen: No acute abnormality. Musculoskeletal: The thoracic spine demonstrates diffuse degenerative disc disease. A spinal stimulator is present at the mid thoracic level. Review of the MIP images confirms the above findings. IMPRESSION: No evidence of pulmonary embolism. There is evidence of pulmonary interstitial edema and CHF. Electronically Signed   By: Aletta Edouard M.D.   On: 01/19/2017 15:18   Dg Chest Port 1 View  Result Date: 01/19/2017 CLINICAL DATA:  Portable chest obtained for Hypoxia. Hx of CHF. HTN. EXAM: PORTABLE CHEST 1 VIEW COMPARISON:  01/18/2017 FINDINGS: Bilateral glenohumeral joint osteoarthritis. Patient rotated right. Midline trachea. Cardiomegaly accentuated by AP portable technique. Right paratracheal soft tissue fullness has been present on multiple prior exams and is likely due to AP portable technique and prominent great vessels. No pleural effusion or pneumothorax. Pulmonary interstitial prominence is similar, and accentuated by low lung volumes. No lobar consolidation. Dorsal spinal stimulator. Lumbar spine fixation. IMPRESSION: No significant change since the prior exam. Cardiomegaly with low lung volumes and pulmonary interstitial thickening. Suspicious for pulmonary venous congestion. Electronically Signed   By: Abigail Miyamoto M.D.   On: 01/19/2017 11:14   Dg Chest Port 1 View  Result Date: 01/18/2017 CLINICAL DATA:  Hypoxia EXAM: PORTABLE CHEST 1 VIEW  COMPARISON:  11/17/2016 FINDINGS: Low lung volumes with chronic interstitial prominence. Cardiomegaly with mild aortic atherosclerosis. No pneumonic consolidation, effusion or pneumothorax. Glenohumeral joint osteoarthritis bilaterally with remodeled appearance of the glenoid fossa and both humeral heads. Question chronic humeral neck impacted fractures bilaterally. Evidence prior spinal fusion along the visualized lower thoracic and upper lumbar spine. Neural stimulator lead projects over the mid thoracic spine. IMPRESSION: Stable cardiomegaly with chronic interstitial lung markings some of which may represent chronic mild interstitial edema. Electronically Signed   By: Ashley Royalty M.D.   On: 01/18/2017 15:13        Scheduled Meds: . calcium-vitamin D  2 tablet Oral Daily  . ciprofloxacin  250 mg Oral BID  . digoxin  0.0625 mg Oral Daily  . diltiazem  240 mg Oral QPM  . furosemide  80 mg Intravenous BID  . insulin aspart  0-5 Units Subcutaneous QHS  . insulin aspart  0-9 Units Subcutaneous TID WC  . linagliptin  5 mg Oral Daily  . pantoprazole  40 mg Oral Daily  . polyvinyl alcohol  2 drop Both Eyes BID  . potassium chloride SA  20 mEq Oral BID  . predniSONE  40 mg Oral Q48H  . predniSONE  60 mg Oral Q48H  . sodium chloride flush  3 mL Intravenous Q12H  . valACYclovir  500 mg Oral Daily   Continuous Infusions:   LOS: 1 day     Cordelia Poche, MD [facility] 01/20/2017, 8:39 AM Pager: [phone]  If 7PM-7AM, please contact night-coverage [email or web address] Password [email or web address] 01/20/2017, 8:39 AM

## 2017-01-21 ENCOUNTER — Inpatient Hospital Stay (HOSPITAL_COMMUNITY): Payer: Medicare Other

## 2017-01-21 DIAGNOSIS — R400 Somnolence: Secondary | ICD-10-CM

## 2017-01-21 DIAGNOSIS — R198 Other specified symptoms and signs involving the digestive system and abdomen: Secondary | ICD-10-CM

## 2017-01-21 DIAGNOSIS — J9612 Chronic respiratory failure with hypercapnia: Secondary | ICD-10-CM

## 2017-01-21 LAB — CBC
HCT: 40.7 % (ref 36.0–46.0)
HEMOGLOBIN: 12.1 g/dL (ref 12.0–15.0)
MCH: 25.2 pg — ABNORMAL LOW (ref 26.0–34.0)
MCHC: 29.7 g/dL — ABNORMAL LOW (ref 30.0–36.0)
MCV: 84.6 fL (ref 78.0–100.0)
PLATELETS: 27 10*3/uL — AB (ref 150–400)
RBC: 4.81 MIL/uL (ref 3.87–5.11)
RDW: 22.1 % — ABNORMAL HIGH (ref 11.5–15.5)
WBC: 16.3 10*3/uL — ABNORMAL HIGH (ref 4.0–10.5)

## 2017-01-21 LAB — GLUCOSE, CAPILLARY
GLUCOSE-CAPILLARY: 152 mg/dL — AB (ref 65–99)
GLUCOSE-CAPILLARY: 294 mg/dL — AB (ref 65–99)
GLUCOSE-CAPILLARY: 243 mg/dL — AB (ref 65–99)
Glucose-Capillary: 254 mg/dL — ABNORMAL HIGH (ref 65–99)

## 2017-01-21 LAB — BASIC METABOLIC PANEL
ANION GAP: 10 (ref 5–15)
BUN: 21 mg/dL — ABNORMAL HIGH (ref 6–20)
CALCIUM: 8.5 mg/dL — AB (ref 8.9–10.3)
CO2: 32 mmol/L (ref 22–32)
Chloride: 96 mmol/L — ABNORMAL LOW (ref 101–111)
Creatinine, Ser: 0.96 mg/dL (ref 0.44–1.00)
GFR, EST NON AFRICAN AMERICAN: 53 mL/min — AB (ref >60)
Glucose, Bld: 122 mg/dL — ABNORMAL HIGH (ref 65–99)
Potassium: 3.6 mmol/L (ref 3.5–5.1)
SODIUM: 138 mmol/L (ref 135–145)

## 2017-01-21 LAB — URINE CULTURE: CULTURE: NO GROWTH

## 2017-01-21 MED ORDER — TORSEMIDE 20 MG PO TABS
80.0000 mg | ORAL_TABLET | Freq: Every day | ORAL | Status: DC
  Administered 2017-01-21 – 2017-01-22 (×2): 80 mg via ORAL
  Filled 2017-01-21 (×2): qty 4

## 2017-01-21 MED ORDER — METOPROLOL TARTRATE 5 MG/5ML IV SOLN: 5.0000 mg | Freq: Once | INTRAVENOUS | Status: DC

## 2017-01-21 MED ORDER — DILTIAZEM HCL ER COATED BEADS 240 MG PO CP24: 240.0000 mg | ORAL_CAPSULE | Freq: Every day | ORAL | Status: DC

## 2017-01-21 MED ORDER — MUPIROCIN 2 % EX OINT
1.0000 "application " | TOPICAL_OINTMENT | Freq: Two times a day (BID) | CUTANEOUS | Status: DC
  Administered 2017-01-21 – 2017-01-22 (×2): 1 via NASAL
  Filled 2017-01-21: qty 22

## 2017-01-21 MED ORDER — CHLORHEXIDINE GLUCONATE CLOTH 2 % EX PADS
6.0000 | MEDICATED_PAD | Freq: Every day | CUTANEOUS | Status: DC
  Administered 2017-01-22: 6 via TOPICAL

## 2017-01-21 MED ORDER — METOPROLOL TARTRATE 50 MG PO TABS
100.0000 mg | ORAL_TABLET | Freq: Two times a day (BID) | ORAL | Status: DC
Start: 2017-01-21 — End: 2017-01-22
  Administered 2017-01-21 – 2017-01-22 (×3): 100 mg via ORAL
  Filled 2017-01-21 (×3): qty 2

## 2017-01-21 NOTE — Progress Notes (Signed)
PROGRESS NOTE    [name] [name]  GYK:[id number] DOB: [birth year] DOA: 01/18/2017 PCP: Lajean Manes, MD   Brief Narrative: [name] [name] is a 83 y.o. female with medical history significant of atrial fibrillation, diastolic heart failure, chronic ITP, hypertension, CKD stage III. He presented with altered mental status which is only improved slightly. She also has some acute on chronic wrist or failure. Currently, there are no obvious reasons for her deterioration other than possibly some mild fluid overload. CTA PE pending.   Assessment & Plan:   Active Problems:   HTN (hypertension)   CAD (coronary artery disease) of artery bypass graft   Chronic narcotic dependence (HCC)   Atrial fibrillation (HCC)   Chronic ITP (idiopathic thrombocytopenia) (HCC)   Stage III chronic kidney disease   Venous stasis dermatitis of both lower extremities   Altered mental status   Chronic diastolic heart failure (HCC)   Chronic respiratory failure with hypoxia (HCC)   Altered mental status Improved today. Per discussion with son yesterday, seems likely to be at baseline. -continue to hold morphine -continue oxycodone 22m as prn (previously scheduled) -frequent neuro checks  Dysuria New. Patient had urine culture on admission which was significant for multiple organisms. -repeat urine culture still pending -continue ciprofloxacin   Abdominal pain Resolved. Unknown etiology. CT unremarkable for etiology.  Atrial fibrillation Uncontrolled today rate controlled. Cardizem and metoprolol were not prescribed -restart home Cardizem and metoprolol -continue telemetry  Acute on chronic respiratory failure Uses 4 L at baseline. Patient has been increased to 10 L high flow nasal cannula. CTA negative. Vascular congestion present. -Continue oxygen therapy and wean as tolerated -transition to oral diuresis  Essential hypertension -continue diltiazem and metoprolol   ITP Platelets  stable currently. Possibly secondary to lasix. -discontinue lasix -continue prednisone  Chronic diastolic heart failure Last EF of 65-70% without mention of diastolic dysfunction on echo from 11/18/2016. Echo in 10/18/2012 significant for grade 1 diastolic dysfunction. Euvolemic. -daily weights -strict in/out -Continue metoprolol -Continue digoxin -Discontinue lasix and restart home toresmide   Diabetes mellitus Hemoglobin A1C of 5.8 in 2014. Blood sugar is adequate currently -Continue Januvia -SSI  Stage III CKD Stable. Creatinine improving with diuresis.  Venous stasis ulcers No evidence of infection  Chronic pain -hold morphine -continue oxycodone prn   DVT prophylaxis: SCDs Code Status: DNR Family Communication: None at bedside Disposition Plan: Pending continued medical management   Consultants:   None  Procedures:   None  Antimicrobials:  None    Subjective: No dyspnea or chest pain. No complaints of palpitations.  Objective: Vitals:   01/20/17 1418 01/20/17 2141 01/21/17 0445 01/21/17 0807  BP: (!) 149/73 (!) 156/84 (!) 155/76   Pulse: 70     Resp: (!) 22 (!) 22 (!) 22   Temp: 97.9 F (36.6 C) 98 F (36.7 C) 98.6 F (37 C)   TempSrc: Oral Oral Oral   SpO2: 98% 95% 97% 97%  Weight:   90.1 kg (198 lb 10.2 oz)   Height:        Intake/Output Summary (Last 24 hours) at 01/21/17 1232 Last data filed at 01/21/17 07939 Gross per 24 hour  Intake              480 ml  Output             2150 ml  Net            -1670 ml   Filed Weights   01/19/17 0500 01/20/17  0300 01/21/17 0445  Weight: 92.6 kg (204 lb 2.3 oz) 90 kg (198 lb 6.6 oz) 90.1 kg (198 lb 10.2 oz)    Examination:  General exam: Appears calm and comfortable Respiratory system: Diminished bilaterally. Lungs sound clearer today without crackles. Respiratory effort normal. Cardiovascular system: S1 & S2 heard, rapid rate and irregular rhythm. No murmurs. Gastrointestinal system:  Abdomen is nondistended, soft and nontender. Normal bowel sounds heard. Central nervous system: Alert and oriented to person and place. Follows commands. Not oriented to time. Extremities: No edema. No calf tenderness Skin: No cyanosis. Bilateral lower extremity edema secondary to venous stasis. Pitting edema. Psychiatry: Judgement and insight appear impaired. Mood & affect flat and depressed.    Data Reviewed: I have personally reviewed following labs and imaging studies  CBC:  Recent Labs Lab 01/18/17 1435 01/20/17 0511 01/20/17 1157 01/21/17 0533  WBC 15.1* 10.8* 20.6* 16.3*  HGB 14.3 12.4 13.2 12.1  HCT 48.3* 43.8 44.6 40.7  MCV 86.7 87.4 84.6 84.6  PLT 95* 28* 28* 27*   Basic Metabolic Panel:  Recent Labs Lab 01/18/17 1435 01/19/17 1122 01/20/17 0511 01/21/17 0533  NA 139 144 143 138  K 4.2 3.4* 3.7 3.6  CL 97* 103 103 96*  CO2 _0 32  GLUCOSE 146* 225* 191* 122*  BUN 43* 24* 21* 21*  CREATININE 1.29* 1.06* 0.95 0.96  CALCIUM 8.7* 8.3* 8.6* 8.5*   GFR: Estimated Creatinine Clearance: 47.3 mL/min (by C-G formula based on SCr of 0.96 mg/dL). Liver Function Tests:  Recent Labs Lab 01/18/17 1435  AST 27  ALT 15  ALKPHOS 75  BILITOT 1.6*  PROT 6.8  ALBUMIN 3.2*    Recent Labs Lab 01/18/17 1518  LIPASE 21    Recent Labs Lab 01/18/17 1524  AMMONIA 28   Coagulation Profile:  Recent Labs Lab 01/18/17 1518  INR 1.20   Cardiac Enzymes: No results for input(s): CKTOTAL, CKMB, CKMBINDEX, TROPONINI in the last 168 hours. BNP (last 3 results) No results for input(s): PROBNP in the last 8760 hours. HbA1C: No results for input(s): HGBA1C in the last 72 hours. CBG:  Recent Labs Lab 01/20/17 1147 01/20/17 1656 01/20/17 2136 01/21/17 0735 01/21/17 1138  GLUCAP 261* 227* 169* 152* 294*   Lipid Profile: No results for input(s): CHOL, HDL, LDLCALC, TRIG, CHOLHDL, LDLDIRECT in the last 72 hours. Thyroid Function Tests: No results for  input(s): TSH, T4TOTAL, FREET4, T3FREE, THYROIDAB in the last 72 hours. Anemia Panel: No results for input(s): VITAMINB12, FOLATE, FERRITIN, TIBC, IRON, RETICCTPCT in the last 72 hours. Sepsis Labs:  Recent Labs Lab 01/18/17 1557  LATICACIDVEN 1.43    Recent Results (from the past 240 hour(s))  Culture, blood (routine x 2)     Status: None (Preliminary result)   Collection Time: 01/18/17  2:58 PM  Result Value Ref Range Status   Specimen Description BLOOD RIGHT FOREARM  Final   Special Requests   Final    BOTTLES DRAWN AEROBIC AND ANAEROBIC Blood Culture adequate volume   Culture   Final    NO GROWTH 2 DAYS Performed at [facility], [place] [address], [place], [place] [address]    Report Status PENDING  Incomplete  Culture, blood (routine x 2)     Status: None (Preliminary result)   Collection Time: 01/18/17  3:24 PM  Result Value Ref Range Status   Specimen Description BLOOD LEFT ANTECUBITAL  Final   Special Requests   Final    BOTTLES DRAWN  AEROBIC AND ANAEROBIC Blood Culture adequate volume   Culture   Final    NO GROWTH 2 DAYS Performed at [facility], [place] [address], [place], [place] [address]    Report Status PENDING  Incomplete  MRSA PCR Screening     Status: Abnormal   Collection Time: 01/18/17  7:26 PM  Result Value Ref Range Status   MRSA by PCR POSITIVE (A) NEGATIVE Final    Comment:        The GeneXpert MRSA Assay (FDA approved for NASAL specimens only), is one component of a comprehensive MRSA colonization surveillance program. It is not intended to diagnose MRSA infection nor to guide or monitor treatment for MRSA infections. RESULT CALLED TO, READ BACK BY AND VERIFIED WITH: M.BRYANT,RN 2259 01/18/17 W.SHEA   Urine culture     Status: Abnormal   Collection Time: 01/18/17  8:58 PM  Result Value Ref Range Status   Specimen Description URINE, RANDOM  Final   Special Requests NONE  Final   Culture MULTIPLE SPECIES PRESENT, SUGGEST  RECOLLECTION (A)  Final   Report Status 01/20/2017 FINAL  Final         Radiology Studies: Ct Angio Chest Pe W Or Wo Contrast  Result Date: 01/19/2017 CLINICAL DATA:  Hypoxia and elevated D-dimer. EXAM: CT ANGIOGRAPHY CHEST WITH CONTRAST TECHNIQUE: Multidetector CT imaging of the chest was performed using the standard protocol during bolus administration of intravenous contrast. Multiplanar CT image reconstructions and MIPs were obtained to evaluate the vascular anatomy. CONTRAST:  75 mL Isovue 370 IV COMPARISON:  Chest x-ray earlier today. FINDINGS: Cardiovascular: Satisfactory opacification of the pulmonary arteries to the segmental level. No evidence of pulmonary embolism. The heart is mildly enlarged. There is a stent visible in the LAD. No pericardial effusion. Mediastinum/Nodes: No enlarged mediastinal, hilar, or axillary lymph nodes. Thyroid gland, trachea, and esophagus demonstrate no significant findings. Lungs/Pleura: Lungs show diffuse interstitial thickening, pulmonary venous hypertension and areas of ground-glass airspace opacity. Findings are consistent with pulmonary interstitial edema and congestive heart failure. No significant pleural fluid identified. No focal airspace consolidation, pneumothorax or nodule identified. Upper Abdomen: No acute abnormality. Musculoskeletal: The thoracic spine demonstrates diffuse degenerative disc disease. A spinal stimulator is present at the mid thoracic level. Review of the MIP images confirms the above findings. IMPRESSION: No evidence of pulmonary embolism. There is evidence of pulmonary interstitial edema and CHF. Electronically Signed   By: Aletta Edouard M.D.   On: 01/19/2017 15:18   Dg Chest Port 1 View  Result Date: 01/21/2017 CLINICAL DATA:  Hypoxia, history of CHF, atrial fibrillation, nonsmoker. EXAM: PORTABLE CHEST 1 VIEW COMPARISON:  Chest x-ray and chest CT scan of Jan 19, 2017 FINDINGS: The lungs are mildly hypoinflated. The interstitial  markings remain increased. The cardiac silhouette remains enlarged. The pulmonary vascularity is mildly engorged. No significant pleural effusion is observed. Nerve stimulator electrodes project over the mid thoracic spine. IMPRESSION: CHF with mild pulmonary interstitial edema not greatly changed from the previous study. Electronically Signed   By: David  Martinique M.D.   On: 01/21/2017 07:15        Scheduled Meds: . calcium-vitamin D  2 tablet Oral Daily  . ciprofloxacin  250 mg Oral BID  . digoxin  0.0625 mg Oral Daily  . diltiazem  240 mg Oral QPM  . insulin aspart  0-5 Units Subcutaneous QHS  . insulin aspart  0-9 Units Subcutaneous TID WC  . linagliptin  5 mg Oral Daily  .  metoprolol  100 mg Oral BID  . pantoprazole  40 mg Oral Daily  . polyvinyl alcohol  2 drop Both Eyes BID  . potassium chloride SA  20 mEq Oral BID  . predniSONE  40 mg Oral Q48H  . predniSONE  60 mg Oral Q48H  . sodium chloride flush  3 mL Intravenous Q12H  . torsemide  80 mg Oral Daily  . valACYclovir  500 mg Oral Daily   Continuous Infusions:   LOS: 2 days     Cordelia Poche, MD [facility] 01/21/2017, 12:32 PM Pager: [phone]  If 7PM-7AM, please contact night-coverage [email or web address] Password [facility] 01/21/2017, 12:32 PM

## 2017-01-21 NOTE — Progress Notes (Signed)
Inpatient Diabetes Program Recommendations  AACE/ADA: New Consensus Statement on Inpatient Glycemic Control (2015)  Target Ranges:  Prepandial:   less than 140 mg/dL      Peak postprandial:   less than 180 mg/dL (1-2 hours)      Critically ill patients:  140 - 180 mg/dL   Results for [name] [name], [name] [name] (MRN [id number]) as of 01/21/2017 11:53  Ref. Range 01/20/2017 07:26 01/20/2017 11:47 01/20/2017 16:56 01/20/2017 21:36 01/21/2017 07:35 01/21/2017 11:38  Glucose-Capillary Latest Ref Range: 65 - 99 mg/dL 176 (H) 261 (H) 227 (H) 169 (H) 152 (H) 294 (H)   Review of Glycemic Control  Current orders for Inpatient glycemic control: Novolog 0-9 units TID with meals, Novolog 0-5 units QHS, Tradjenta 5 mg daily  Inpatient Diabetes Program Recommendations: Insulin - Meal Coverage: If patient is eating at least 50% of meals, please consider ordering Novolog 3 units TID with meals for meal coverage.  NOTE: Patient is ordered steroids and post prandial glucose is consistently elevated.   Thanks, Barnie Alderman, RN, MSN, CDE Diabetes Coordinator Inpatient Diabetes Program [phone] (Team Pager from 8am to 5pm)

## 2017-01-22 ENCOUNTER — Ambulatory Visit: Payer: Medicare Other | Admitting: Family

## 2017-01-22 ENCOUNTER — Other Ambulatory Visit: Payer: Medicare Other

## 2017-01-22 LAB — CBC
HEMATOCRIT: 38.9 % (ref 36.0–46.0)
Hemoglobin: 11.9 g/dL — ABNORMAL LOW (ref 12.0–15.0)
MCH: 25.6 pg — ABNORMAL LOW (ref 26.0–34.0)
MCHC: 30.6 g/dL (ref 30.0–36.0)
MCV: 83.7 fL (ref 78.0–100.0)
PLATELETS: 26 10*3/uL — AB (ref 150–400)
RBC: 4.65 MIL/uL (ref 3.87–5.11)
RDW: 21.7 % — AB (ref 11.5–15.5)
WBC: 10.3 10*3/uL (ref 4.0–10.5)

## 2017-01-22 LAB — BASIC METABOLIC PANEL
Anion gap: 11 (ref 5–15)
BUN: 25 mg/dL — AB (ref 6–20)
CO2: 32 mmol/L (ref 22–32)
Calcium: 8.8 mg/dL — ABNORMAL LOW (ref 8.9–10.3)
Chloride: 95 mmol/L — ABNORMAL LOW (ref 101–111)
Creatinine, Ser: 1.07 mg/dL — ABNORMAL HIGH (ref 0.44–1.00)
GFR, EST AFRICAN AMERICAN: 54 mL/min — AB (ref >60)
GFR, EST NON AFRICAN AMERICAN: 47 mL/min — AB (ref >60)
Glucose, Bld: 145 mg/dL — ABNORMAL HIGH (ref 65–99)
Potassium: 4.1 mmol/L (ref 3.5–5.1)
SODIUM: 138 mmol/L (ref 135–145)

## 2017-01-22 LAB — GLUCOSE, CAPILLARY
Glucose-Capillary: 195 mg/dL — ABNORMAL HIGH (ref 65–99)
Glucose-Capillary: 226 mg/dL — ABNORMAL HIGH (ref 65–99)

## 2017-01-22 MED ORDER — OXYCODONE HCL 10 MG PO TABS: 10.0000 mg | ORAL_TABLET | Freq: Four times a day (QID) | ORAL | 0 refills | Status: DC | PRN

## 2017-01-22 MED ORDER — PREDNISONE 20 MG PO TABS: 60.0000 mg | ORAL_TABLET | Freq: Every day | ORAL | Status: DC

## 2017-01-22 MED ORDER — CIPROFLOXACIN HCL 250 MG PO TABS: 250.0000 mg | ORAL_TABLET | Freq: Two times a day (BID) | ORAL | 0 refills | Status: DC

## 2017-01-22 NOTE — Discharge Instructions (Signed)
[name] [name],  Your admitted for altered mental status. This was likely secondary to overdose of your eye contacts. These have been tapered down. You also had concern for urinary tract infection. Your started on antibiotics for this. While you're here, your platelets decreased a lot down to around 28,000. This is being treated as an outpatient and your prednisone regimen has been adjusted; you'll be taking prednisone 60 mg daily now.

## 2017-01-22 NOTE — Discharge Summary (Signed)
Physician Discharge Summary  [name] [name]:[id number] DOB: December 01, 1930 DOA: 01/18/2017  PCP: [name] Manes, MD  Admit date: 01/18/2017 Discharge date: 01/22/2017  Admitted From: SNF Disposition: SNF  Recommendations for Outpatient Follow-up:  1. Follow up with PCP in 1 week 2. Follow up with hematology/oncology in 1 week 3. Repeat CBC in 1-2 days to recheck platelet count 4. Please follow up on the following pending results: Blood cultures  Home Health: N/A Equipment/Devices: N/A  Discharge Condition: Stable CODE STATUS: DNR/DNI Diet recommendation: Heart healthy/carb modified   Brief/Interim Summary:   Chief Complaint: Altered mental status  HPI: [name] [name] is a 83 y.o. female with medical history significant of atrial fibrillation, diastolic heart failure, chronic ITP, hypertension, CKD stage III. Patient unable to provide a history secondary to confusion. Per chart review and ED physician report, patient presented from her skilled nursing facility. There is a concern that she may have overdosed on pain medication, which is administered by the skilled nursing's facility staff. Patient was transported to the emergency department via EMS where she received Narcan without improvement of her mental status. She was placed on supplemental oxygen, for which she is on chronically at baseline. Upon arriving to the ED, she was continued on oxygen and mental status improved slowly  But still significantly altered.   ED Course: Vitals: Afebrile. Pulse and low 100s. Respirations and low 20s. A pressure elevated to 140s. Down to 88% on room air, placed on nasal cannula Labs: Creatinine 1.29, total bilirubin 1.6, platelets 95, white blood cell count 15.1, INR 1.20, negative Tylenol and salicylate level, Subtherapeutic digoxin level of 0.7 Imaging:  Chest x-ray significant for chronic mild interstitial edema Medications/Course:  Narcan en route to the ED, [facility]  course:  Altered mental status This is likely secondary to medication effect as patient was taking scheduled MS Contin in addition to scheduled oxycodone. MS Contin was discontinued and oxycodone was started back as when necessary. Mental status improved to baseline.  Dysuria Patient reported dysuria which is new after admission. Initial urine culture was significant for multiple species and repeat urine culture was negative. Patient was started on ciprofloxacin for a three-day course. She has one more dose pending.  Abdominal pain Present on admission but resolved after admission. CT abdomen and pelvis was unremarkable for pain etiology.  Atrial fibrillation Unknown if chronic or permanent. Continued home Cardizem and metoprolol.  Acute on chronic respiratory failure Uses 4 L at baseline. Patient had increasing functional requirements likely secondary to mild acute heart failure exacerbation. She was given Lasix IV which improved symptoms. She was weaned down back to 4 L of oxygen. Nasal cannula. CTA PE was pursued which was negative for a pulmonary embolism. .  Essential hypertension Continue diltiazem and metoprolol.   ITP Platelets 95,000 on admission. Drop down to 28,000 and has remained stable with a platelet count 26,000 at discharge. Discussed with Dr. Marin Olp who recommended increasing patient back to 60 mg of prednisone daily. She will need follow-up as an outpatient with hematology/oncology. Possibly made worse with Lasix which has been discontinued.  Acute on chronic diastolic heart failure Last EF of 65-70% without mention of diastolic dysfunction on echo from 11/18/2016. Echo in 10/18/2012 significant for grade 1 diastolic dysfunction. Developing exacerbation while inpatient. Management above. We started torsemide at discharge.  Diabetes mellitus Hemoglobin A1C of 5.8 in 2014. Continued Januvia.  Stage III CKD Stable. Creatinine improving with diuresis.  Venous  stasis ulcers No evidence  of infection  Chronic pain Help morphine and oxycodone initially. We started oxycodone when necessary.  Discharge Diagnoses:  Principal Problem:   Altered mental status Active Problems:   HTN (hypertension)   CAD (coronary artery disease) of artery bypass graft   Chronic narcotic dependence (HCC)   Atrial fibrillation (HCC)   Chronic ITP (idiopathic thrombocytopenia) (HCC)   Stage III chronic kidney disease   Venous stasis dermatitis of both lower extremities   Chronic diastolic heart failure (HCC)   Chronic respiratory failure with hypoxia [facility])    Discharge Instructions  Discharge Instructions    Call MD for:  difficulty breathing, headache or visual disturbances    Complete by:  As directed    Call MD for:  extreme fatigue    Complete by:  As directed    Call MD for:  persistant nausea and vomiting    Complete by:  As directed    Call MD for:  temperature >100.4    Complete by:  As directed    Diet - low sodium heart healthy    Complete by:  As directed    Increase activity slowly    Complete by:  As directed      Allergies as of 01/22/2017      Reactions   Keflex [cephalexin] Other (See Comments)   Reaction:  Unknown       Medication List    STOP taking these medications   gabapentin 600 MG tablet Commonly known as:  NEURONTIN   morphine 10 MG 24 hr capsule Commonly known as:  KADIAN     TAKE these medications   ACIDOPHILUS/CITRUS PECTIN Tabs Take 1 tablet by mouth every evening.   albuterol (2.5 MG/3ML) 0.083% nebulizer solution Commonly known as:  PROVENTIL Take 2.5 mg by nebulization every 6 (six) hours as needed for wheezing or shortness of breath.   CALCIUM 600+D 600-200 MG-UNIT Tabs Generic drug:  Calcium Carbonate-Vitamin D Take 2 tablets by mouth daily.   ciprofloxacin 250 MG tablet Commonly known as:  CIPRO Take 1 tablet (250 mg total) by mouth 2 (two) times daily.   digoxin 0.125 MG tablet Commonly known  as:  LANOXIN Take 0.0625 mg by mouth daily.   diltiazem 240 MG 24 hr capsule Commonly known as:  CARDIZEM CD Take 1 capsule (240 mg total) by mouth daily. What changed:  when to take this   famciclovir 250 MG tablet Commonly known as:  FAMVIR Take 1 tablet (250 mg total) by mouth daily.   insulin lispro 100 UNIT/ML injection Commonly known as:  HUMALOG Inject 0-15 Units into the skin 3 (three) times daily as needed for high blood sugar. Pt uses as needed per sliding scale:  70-120:  0 units, 121-150:  2 units, 151-200:  3 units, 201-250:  5 units, 251-300:  8 units, 301-350:  11 units, 351-400:  15 units, Greater than 400:  Call MD   metoprolol 100 MG tablet Commonly known as:  LOPRESSOR Take 1 tablet (100 mg total) by mouth 2 (two) times daily.   Oxycodone HCl 10 MG Tabs Take 1 tablet (10 mg total) by mouth every 6 (six) hours as needed (pain). What changed:  when to take this  reasons to take this   pantoprazole 40 MG tablet Commonly known as:  PROTONIX Take 1 tablet (40 mg total) by mouth daily.   potassium chloride SA 20 MEQ tablet Commonly known as:  K-DUR,KLOR-CON Take 1 tablet (20 mEq total) by mouth 2 (two) times  daily.   predniSONE 20 MG tablet Commonly known as:  DELTASONE Take 3 tablets (60 mg total) by mouth daily with breakfast. Start taking on:  01/23/2017 What changed:  how much to take  how to take this  when to take this  additional instructions   sitaGLIPtin 50 MG tablet Commonly known as:  JANUVIA Take 50 mg by mouth daily.   sodium chloride 0.65 % Soln nasal spray Commonly known as:  OCEAN Place 1 spray into both nostrils 3 (three) times daily.   SYSTANE 0.4-0.3 % Soln Generic drug:  Polyethyl Glycol-Propyl Glycol Place 2 drops into both eyes 2 (two) times daily.   torsemide 20 MG tablet Commonly known as:  DEMADEX Take 4 tablets (80 mg total) by mouth 2 (two) times daily. What changed:  when to take this      Follow-up  Information    Stoneking, Hal, MD. Schedule an appointment as soon as possible for a visit in 1 week(s).   Specialty:  Internal Medicine Contact information: [address] [place] [address] [phone]        Eliezer Bottom, NP. Schedule an appointment as soon as possible for a visit in 1 week(s).   Specialty:  Nurse Practitioner Why:  Thrombocytopenia Contact information: [place] [address] [phone]          Allergies  Allergen Reactions  . Keflex [Cephalexin] Other (See Comments)    Reaction:  Unknown     Consultations:  None   Procedures/Studies: Ct Abdomen Pelvis Wo Contrast  Result Date: 01/18/2017 CLINICAL DATA:  83 year old female with history of atrial fibrillation and diastolic heart failure presenting with possible drug overdose. EXAM: CT ABDOMEN AND PELVIS WITHOUT CONTRAST TECHNIQUE: Multidetector CT imaging of the abdomen and pelvis was performed following the standard protocol without IV contrast. COMPARISON:  CT the abdomen and pelvis 12/20/2012. FINDINGS: Lower chest: Spinal cord stimulator in the lower thoracic spinal column. Atherosclerotic calcifications in the left main, left anterior descending, left circumflex and right coronary arteries. Coronary artery stent in the left anterior descending coronary artery. Cardiomegaly with biatrial dilatation. Hepatobiliary: No definite cystic or solid hepatic lesions are identified on today's noncontrast CT examination. Multiple partially calcified gallstones lie dependently in the gallbladder. No findings to suggest an acute cholecystitis at this time. Pancreas: No definite pancreatic mass or peripancreatic inflammatory changes are noted on today's noncontrast CT examination. Spleen: Unremarkable. Adrenals/Urinary Tract: Unenhanced appearance of the kidneys and bilateral adrenal glands is unremarkable. No hydroureteronephrosis. Urinary bladder is moderately  distended, but otherwise unremarkable in appearance. Stomach/Bowel: Unenhanced appearance of the stomach is normal. There is no pathologic dilatation of small bowel or colon. The appendix is not confidently identified and may be surgically absent. Regardless, there are no inflammatory changes noted adjacent to the cecum to suggest the presence of an acute appendicitis at this time. Vascular/Lymphatic: Aortic atherosclerosis, without definite aneurysm in the abdominal or pelvic vasculature on today's noncontrast CT examination. No lymphadenopathy noted in the abdomen or pelvis. Reproductive: Uterus and ovaries are atrophic. Other: Small umbilical hernia containing omental fat. No significant volume of ascites. No pneumoperitoneum. Musculoskeletal: Orthopedic fixation hardware throughout the lumbar spine. There are no aggressive appearing lytic or blastic lesions noted in the visualized portions of the skeleton. Status post right hip arthroplasty. IMPRESSION: 1. No acute findings are noted in the abdomen or pelvis. 2. Aortic atherosclerosis, in addition to left main and 3 vessel coronary artery disease.  3. Small umbilical hernia containing only omental fat. 4. Additional incidental findings, as above. Electronically Signed   By: Vinnie Langton M.D.   On: 01/18/2017 19:54   Ct Head Wo Contrast  Result Date: 01/18/2017 CLINICAL DATA:  Atrial fibrillation EXAM: CT HEAD WITHOUT CONTRAST TECHNIQUE: Contiguous axial images were obtained from the base of the skull through the vertex without intravenous contrast. COMPARISON:  07/26/2017 FINDINGS: Brain: There is prominence of the sulci and ventricles compatible with brain atrophy. Periventricular and subcortical low attenuation compatible with chronic small vessel ischemic change. Vascular: No hyperdense vessel or unexpected calcification. Skull: Normal. Negative for fracture or focal lesion. Sinuses/Orbits: No acute finding. Other: None. IMPRESSION: 1. No acute  intracranial abnormalities. 2. Chronic small vessel ischemic change with brain atrophy Electronically Signed   By: Kerby Moors M.D.   On: 01/18/2017 20:56   Ct Angio Chest Pe W Or Wo Contrast  Result Date: 01/19/2017 CLINICAL DATA:  Hypoxia and elevated D-dimer. EXAM: CT ANGIOGRAPHY CHEST WITH CONTRAST TECHNIQUE: Multidetector CT imaging of the chest was performed using the standard protocol during bolus administration of intravenous contrast. Multiplanar CT image reconstructions and MIPs were obtained to evaluate the vascular anatomy. CONTRAST:  75 mL Isovue 370 IV COMPARISON:  Chest x-ray earlier today. FINDINGS: Cardiovascular: Satisfactory opacification of the pulmonary arteries to the segmental level. No evidence of pulmonary embolism. The heart is mildly enlarged. There is a stent visible in the LAD. No pericardial effusion. Mediastinum/Nodes: No enlarged mediastinal, hilar, or axillary lymph nodes. Thyroid gland, trachea, and esophagus demonstrate no significant findings. Lungs/Pleura: Lungs show diffuse interstitial thickening, pulmonary venous hypertension and areas of ground-glass airspace opacity. Findings are consistent with pulmonary interstitial edema and congestive heart failure. No significant pleural fluid identified. No focal airspace consolidation, pneumothorax or nodule identified. Upper Abdomen: No acute abnormality. Musculoskeletal: The thoracic spine demonstrates diffuse degenerative disc disease. A spinal stimulator is present at the mid thoracic level. Review of the MIP images confirms the above findings. IMPRESSION: No evidence of pulmonary embolism. There is evidence of pulmonary interstitial edema and CHF. Electronically Signed   By: Aletta Edouard M.D.   On: 01/19/2017 15:18   Dg Chest Port 1 View  Result Date: 01/21/2017 CLINICAL DATA:  Hypoxia, history of CHF, atrial fibrillation, nonsmoker. EXAM: PORTABLE CHEST 1 VIEW COMPARISON:  Chest x-ray and chest CT scan of Jan 19, 2017  FINDINGS: The lungs are mildly hypoinflated. The interstitial markings remain increased. The cardiac silhouette remains enlarged. The pulmonary vascularity is mildly engorged. No significant pleural effusion is observed. Nerve stimulator electrodes project over the mid thoracic spine. IMPRESSION: CHF with mild pulmonary interstitial edema not greatly changed from the previous study. Electronically Signed   By: David  Martinique M.D.   On: 01/21/2017 07:15   Dg Chest Port 1 View  Result Date: 01/19/2017 CLINICAL DATA:  Portable chest obtained for Hypoxia. Hx of CHF. HTN. EXAM: PORTABLE CHEST 1 VIEW COMPARISON:  01/18/2017 FINDINGS: Bilateral glenohumeral joint osteoarthritis. Patient rotated right. Midline trachea. Cardiomegaly accentuated by AP portable technique. Right paratracheal soft tissue fullness has been present on multiple prior exams and is likely due to AP portable technique and prominent great vessels. No pleural effusion or pneumothorax. Pulmonary interstitial prominence is similar, and accentuated by low lung volumes. No lobar consolidation. Dorsal spinal stimulator. Lumbar spine fixation. IMPRESSION: No significant change since the prior exam. Cardiomegaly with low lung volumes and pulmonary interstitial thickening. Suspicious for pulmonary venous congestion. Electronically Signed   By: Abigail Miyamoto  M.D.   On: 01/19/2017 11:14   Dg Chest Port 1 View  Result Date: 01/18/2017 CLINICAL DATA:  Hypoxia EXAM: PORTABLE CHEST 1 VIEW COMPARISON:  11/17/2016 FINDINGS: Low lung volumes with chronic interstitial prominence. Cardiomegaly with mild aortic atherosclerosis. No pneumonic consolidation, effusion or pneumothorax. Glenohumeral joint osteoarthritis bilaterally with remodeled appearance of the glenoid fossa and both humeral heads. Question chronic humeral neck impacted fractures bilaterally. Evidence prior spinal fusion along the visualized lower thoracic and upper lumbar spine. Neural stimulator lead  projects over the mid thoracic spine. IMPRESSION: Stable cardiomegaly with chronic interstitial lung markings some of which may represent chronic mild interstitial edema. Electronically Signed   By: Ashley Royalty M.D.   On: 01/18/2017 15:13      Subjective: Patient reports no dyspnea or chest pain. No palpitations.  Discharge Exam: Vitals:   01/21/17 2203 01/22/17 0500  BP: (!) 156/74 (!) 155/71  Pulse: 72 93  Resp: 20 16  Temp: 98.2 F (36.8 C) 97.8 F (36.6 C)   Vitals:   01/21/17 1300 01/21/17 2203 01/22/17 0500 01/22/17 1200  BP:  (!) 156/74 (!) 155/71   Pulse:  72 93   Resp:  20 16   Temp:  98.2 F (36.8 C) 97.8 F (36.6 C)   TempSrc:  Oral Oral   SpO2: 93% 98% 96% 95%  Weight:   84.9 kg (187 lb 2.7 oz)   Height:        General exam: Appears calm and comfortable Respiratory system: Clear bilaterally. Respiratory effort normal. Cardiovascular system: S1 & S2 heard, normal rate and irregular rhythm. No murmurs. Gastrointestinal system: Abdomen is nondistended, soft and nontender. Normal bowel sounds heard. Central nervous system: Alert and oriented to person and place. Not oriented to time. Extremities: Pitting edema. No calf tenderness Skin: No cyanosis. Psychiatry: Judgement and insight appear impaired. Mood & affect flat and depressed.   The results of significant diagnostics from this hospitalization (including imaging, microbiology, ancillary and laboratory) are listed below for reference.     Microbiology: Recent Results (from the past 240 hour(s))  Culture, blood (routine x 2)     Status: None (Preliminary result)   Collection Time: 01/18/17  2:58 PM  Result Value Ref Range Status   Specimen Description BLOOD RIGHT FOREARM  Final   Special Requests   Final    BOTTLES DRAWN AEROBIC AND ANAEROBIC Blood Culture adequate volume   Culture   Final    NO GROWTH 3 DAYS Performed at [facility], [address] [place], [place] [address]    Report Status  PENDING  Incomplete  Culture, blood (routine x 2)     Status: None (Preliminary result)   Collection Time: 01/18/17  3:24 PM  Result Value Ref Range Status   Specimen Description BLOOD LEFT ANTECUBITAL  Final   Special Requests   Final    BOTTLES DRAWN AEROBIC AND ANAEROBIC Blood Culture adequate volume   Culture   Final    NO GROWTH 3 DAYS Performed at [facility], [place] [address], [place], [place] [address]    Report Status PENDING  Incomplete  MRSA PCR Screening     Status: Abnormal   Collection Time: 01/18/17  7:26 PM  Result Value Ref Range Status   MRSA by PCR POSITIVE (A) NEGATIVE Final    Comment:        The GeneXpert MRSA Assay (FDA approved for NASAL specimens only), is one component of a comprehensive MRSA colonization surveillance  program. It is not intended to diagnose MRSA infection nor to guide or monitor treatment for MRSA infections. RESULT CALLED TO, READ BACK BY AND VERIFIED WITH: M.BRYANT,RN 2259 01/18/17 W.SHEA   Urine culture     Status: Abnormal   Collection Time: 01/18/17  8:58 PM  Result Value Ref Range Status   Specimen Description URINE, RANDOM  Final   Special Requests NONE  Final   Culture MULTIPLE SPECIES PRESENT, SUGGEST RECOLLECTION (A)  Final   Report Status 01/20/2017 FINAL  Final  Culture, Urine     Status: None   Collection Time: 01/20/17  8:30 AM  Result Value Ref Range Status   Specimen Description URINE, RANDOM  Final   Special Requests NONE  Final   Culture   Final    NO GROWTH Performed at [facility], [place] [address] [place], [place] [address]    Report Status 01/21/2017 FINAL  Final     Labs: BNP (last 3 results)  Recent Labs  09/12/16 0417 11/17/16 1500 01/18/17 1524  BNP 1,218.0* 1,492.9* 841.2*   Basic Metabolic Panel:  Recent Labs Lab 01/18/17 1435 01/19/17 1122 01/20/17 0511 01/21/17 0533 01/22/17 0531  NA 139 144 143 138 138  K 4.2 3.4* 3.7 3.6 4.1  CL 97* 103 103 96* 95*  CO2 _0 32  32  GLUCOSE 146* 225* 191* 122* 145*  BUN 43* 24* 21* 21* 25*  CREATININE 1.29* 1.06* 0.95 0.96 1.07*  CALCIUM 8.7* 8.3* 8.6* 8.5* 8.8*   Liver Function Tests:  Recent Labs Lab 01/18/17 1435  AST 27  ALT 15  ALKPHOS 75  BILITOT 1.6*  PROT 6.8  ALBUMIN 3.2*    Recent Labs Lab 01/18/17 1518  LIPASE 21    Recent Labs Lab 01/18/17 1524  AMMONIA 28   CBC:  Recent Labs Lab 01/18/17 1435 01/20/17 0511 01/20/17 1157 01/21/17 0533 01/22/17 0531  WBC 15.1* 10.8* 20.6* 16.3* 10.3  HGB 14.3 12.4 13.2 12.1 11.9*  HCT 48.3* 43.8 44.6 40.7 38.9  MCV 86.7 87.4 84.6 84.6 83.7  PLT 95* 28* 28* 27* 26*   CBG:  Recent Labs Lab 01/21/17 1138 01/21/17 1634 01/21/17 2200 01/22/17 0821 01/22/17 1134  GLUCAP 294* 254* 243* 195* 226*   Urinalysis    Component Value Date/Time   COLORURINE YELLOW 01/18/2017 2058   APPEARANCEUR CLEAR 01/18/2017 2058   LABSPEC 1.010 01/18/2017 2058   PHURINE 6.0 01/18/2017 2058   GLUCOSEU NEGATIVE 01/18/2017 2058   HGBUR SMALL (A) 01/18/2017 2058   BILIRUBINUR NEGATIVE 01/18/2017 2058   KETONESUR NEGATIVE 01/18/2017 2058   PROTEINUR NEGATIVE 01/18/2017 2058   UROBILINOGEN 0.2 08/10/2013 0037   NITRITE NEGATIVE 01/18/2017 2058   LEUKOCYTESUR NEGATIVE 01/18/2017 2058   Microbiology Recent Results (from the past 240 hour(s))  Culture, blood (routine x 2)     Status: None (Preliminary result)   Collection Time: 01/18/17  2:58 PM  Result Value Ref Range Status   Specimen Description BLOOD RIGHT FOREARM  Final   Special Requests   Final    BOTTLES DRAWN AEROBIC AND ANAEROBIC Blood Culture adequate volume   Culture   Final    NO GROWTH 3 DAYS Performed at [facility], [place] [address] [place], [place] [address]    Report Status PENDING  Incomplete  Culture, blood (routine x 2)     Status: None (Preliminary result)   Collection Time: 01/18/17  3:24 PM  Result Value Ref Range Status   Specimen  Description BLOOD LEFT ANTECUBITAL   Final   Special Requests   Final    BOTTLES DRAWN AEROBIC AND ANAEROBIC Blood Culture adequate volume   Culture   Final    NO GROWTH 3 DAYS Performed at [facility], [address], [place], [place] [address]    Report Status PENDING  Incomplete  MRSA PCR Screening     Status: Abnormal   Collection Time: 01/18/17  7:26 PM  Result Value Ref Range Status   MRSA by PCR POSITIVE (A) NEGATIVE Final    Comment:        The GeneXpert MRSA Assay (FDA approved for NASAL specimens only), is one component of a comprehensive MRSA colonization surveillance program. It is not intended to diagnose MRSA infection nor to guide or monitor treatment for MRSA infections. RESULT CALLED TO, READ BACK BY AND VERIFIED WITH: M.BRYANT,RN 2259 01/18/17 W.SHEA   Urine culture     Status: Abnormal   Collection Time: 01/18/17  8:58 PM  Result Value Ref Range Status   Specimen Description URINE, RANDOM  Final   Special Requests NONE  Final   Culture MULTIPLE SPECIES PRESENT, SUGGEST RECOLLECTION (A)  Final   Report Status 01/20/2017 FINAL  Final  Culture, Urine     Status: None   Collection Time: 01/20/17  8:30 AM  Result Value Ref Range Status   Specimen Description URINE, RANDOM  Final   Special Requests NONE  Final   Culture   Final    NO GROWTH Performed at [facility], [place] [address] [place], [place] [address]    Report Status 01/21/2017 FINAL  Final     Time coordinating discharge: Over 30 minutes  SIGNED:   Cordelia Poche, MD [facility] 01/22/2017, 12:18 PM Pager [phone]  If 7PM-7AM, please contact night-coverage [email or web address] Password [email or web address]

## 2017-01-22 NOTE — Progress Notes (Signed)
Patient returning to [facility] SNF. PTAR contacted, patient's family friend [name] [name] [phone]) notified. Patient's friend agreed to notify patient's son about transport. CSW contacted patient's son [name] [name] [phone]), no answer no option to leave voicemail. Packet complete. CSW signing off, please consult if new needs arise.   Abundio Miu, [place] Social Worker [facility] Cell#: [phone]

## 2017-01-22 NOTE — Clinical Social Work Note (Signed)
Clinical Social Work Assessment  Patient Details  Name: [name] [name] MRN: [id number] Date of Birth: May 13, 1931  Date of referral:  01/22/17               Reason for consult:  Facility Placement                Permission sought to share information with:  Facility Sport and exercise psychologist, Family Supports Permission granted to share information::  Yes, Verbal Permission Granted  Name::     Chemical engineer   Agency::     Relationship::  Family Friend  Contact Information:  [phone]  Housing/Transportation Living arrangements for the past 2 months:  [place] of Information:  Patient Patient Interpreter Needed:  None Criminal Activity/Legal Involvement Pertinent to Current Situation/Hospitalization:  No - Comment as needed Significant Relationships:  Adult Children, Friend Lives with:  Facility Resident Do you feel safe going back to the place where you live?  Yes Need for family participation in patient care:  Yes (Comment)  Care giving concerns:  Patient [name] [name] term resident at [facility].   Social Worker assessment / plan:  CSW spoke with patient at bedside. Patient reports that she has been a resident at [facility] for 20 years. Patient reports that she likes [facility] and wants to return. Patient requested that [facility] contact her friend Venida Jarvis to discuss her return to [facility]. CSW informed patient's friend about her return to [facility], patient's friend reported that patient's SNF handles transportation and that she is not available to provide transportation. CSW contacted [facility] and confirmed patient's ability to return. [facility] reported that they do not provide transportation and that patient uses [facility]. Per patient request CSW contacted patient's pastor Fabio Neighbors [phone]) to inquire about ability to transport patient, patient's pastor reported that he does not have anyone available to transport patient. CSW attempted to reach patient's  son multiple times, no answer or option to leave voicemail. CSW completed FL2 and will send dc summary to SNF when complete.   Employment status:    Insurance information:  Medicare PT Recommendations:  Not assessed at this time Information / Referral to community resources:  [place]  Patient/Family's Response to care:  Patient agreeable to return to current SNF.   Patient/Family's Understanding of and Emotional Response to Diagnosis, Current Treatment, and Prognosis:  Patient not able to verbalize understanding of diagnosis, current treatment or prognosis. Patient became upset when talking about [facility] for transportation back to facility. CSW exhausted all patient's transportation options and was not successful. Patient will need [facility] for transportation back to facility, patient is on oxygen and has no family/friends that are able to transport.   Emotional Assessment Appearance:  Appears stated age Attitude/Demeanor/Rapport:  Other (Cooperative) Affect (typically observed):  Calm Orientation:  Oriented to Self, Oriented to Place, Oriented to Situation Alcohol / Substance use:  Not Applicable Psych involvement (Current and /or in the community):  No (Comment)  Discharge Needs  Concerns to be addressed:  No discharge needs identified Readmission within the last 30 days:  No Current discharge risk:  None Barriers to Discharge:  No Barriers Identified   Burnis Medin, LCSW 01/22/2017, 10:59 AM

## 2017-01-22 NOTE — [place] FL2 (Signed)
[place] MEDICAID FL2 LEVEL OF CARE SCREENING TOOL     IDENTIFICATION  Patient Name: [name] [name] Birthdate: 03-16-1931 Sex: female Admission Date (Current Location): 01/18/2017  [facility] and [place] Number:  Herbalist and Address:  [facility],  [place], [place]      Provider Number: [id number]  Attending Physician Name and Address:  Mariel Aloe, MD  Relative Name and Phone Number:       Current Level of Care: Hospital Recommended Level of Care: [place] Prior Approval Number:    Date Approved/Denied:   PASRR Number:    Discharge Plan: SNF    Current Diagnoses: Patient Active Problem List   Diagnosis Date Noted  . Altered mental status 01/18/2017  . Chronic diastolic heart failure ([place]) 01/18/2017  . Chronic respiratory failure with hypoxia ([place]) 01/18/2017  . Stage III chronic kidney disease 11/21/2016  . Obesity (BMI 30.0-34.9) 11/21/2016  . Venous stasis ulcer ([place]) 11/21/2016  . Venous stasis dermatitis of both lower extremities 11/21/2016  . MRSA carrier 11/21/2016  . Acute on chronic diastolic CHF (congestive heart failure) ([place]) 11/20/2016  . Goals of care, counseling/discussion   . Palliative care by specialist   . Chronic ITP (idiopathic thrombocytopenia) ([place]) 10/01/2016  . Gangrene ([place])   . Acute on chronic respiratory failure with hypoxia ([place])   . Coronary atherosclerosis of native coronary artery 10/09/2013  . Atrial fibrillation ([place]) 10/09/2013  . Leukocytosis 08/11/2013  . Chronic narcotic dependence ([place]) 07/16/2013  . Varicose veins of lower extremities with other complications 63/33/5456  . Edema 05/14/2013  . Hypokalemia 12/20/2012  . CAD (coronary artery disease) of artery bypass graft 12/18/2012  . Unstable angina ([place]) 10/18/2012  . Thrombocytopenia ([place]) 10/18/2012  . HTN (hypertension) 10/17/2012  . Chronic back pain 10/17/2012  . Precordial pain 10/17/2012     Orientation RESPIRATION BLADDER Height & Weight     Self, Situation, Place  O2 nasal cannula 4L/minute   Weight: 187 lb 2.7 oz (84.9 kg) Height:  _0  (160 cm)  BEHAVIORAL SYMPTOMS/MOOD NEUROLOGICAL BOWEL NUTRITION STATUS      Incontinent Diet (heart healthy/carb modified)  AMBULATORY STATUS COMMUNICATION OF NEEDS Skin     Verbally Normal                       Personal Care Assistance Level of Assistance              Functional Limitations Info             SPECIAL CARE FACTORS FREQUENCY                       Contractures Contractures Info: Not present    Additional Factors Info  Code Status, Allergies Code Status Info: DNR Allergies Info: Allergies:  Keflex Cephalexin           Current Medications (01/22/2017):  This is the current hospital active medication list Current Facility-Administered Medications  Medication Dose Route Frequency Provider Last Rate Last Dose  . acetaminophen (TYLENOL) tablet 650 mg  650 mg Oral Q6H PRN Mariel Aloe, MD   650 mg at 01/19/17 2563   Or  . acetaminophen (TYLENOL) suppository 650 mg  650 mg Rectal Q6H PRN Mariel Aloe, MD      . albuterol (PROVENTIL) (2.5 MG/3ML) 0.083% nebulizer solution 2.5 mg  2.5 mg Nebulization Q6H PRN Mariel Aloe, MD   2.5  mg at 01/19/17 0308  . calcium-vitamin D (OSCAL WITH D) 500-200 MG-UNIT per tablet 2 tablet  2 tablet Oral Daily Mariel Aloe, MD   2 tablet at 01/22/17 0845  . Chlorhexidine Gluconate Cloth 2 % PADS 6 each  6 each Topical Q0600 Mariel Aloe, MD   6 each at 01/22/17 [phone]  . ciprofloxacin (CIPRO) tablet 250 mg  250 mg Oral BID Mariel Aloe, MD   250 mg at 01/22/17 0845  . digoxin (LANOXIN) tablet 0.0625 mg  0.0625 mg Oral Daily Mariel Aloe, MD   0.0625 mg at 01/22/17 0845  . diltiazem (CARDIZEM CD) 24 hr capsule 240 mg  240 mg Oral QPM Opyd, Ilene Qua, MD   240 mg at 01/21/17 1726  . insulin aspart (novoLOG) injection 0-5 Units  0-5 Units Subcutaneous  QHS Mariel Aloe, MD   2 Units at 01/21/17 2300  . insulin aspart (novoLOG) injection 0-9 Units  0-9 Units Subcutaneous TID WC Mariel Aloe, MD   2 Units at 01/22/17 [phone]  . linagliptin (TRADJENTA) tablet 5 mg  5 mg Oral Daily Mariel Aloe, MD   5 mg at 01/22/17 0845  . metoprolol (LOPRESSOR) tablet 100 mg  100 mg Oral BID Mariel Aloe, MD   100 mg at 01/22/17 0845  . mupirocin ointment (BACTROBAN) 2 % 1 application  1 application Nasal BID Mariel Aloe, MD   1 application at 37/35/78 [phone]  . oxyCODONE (Oxy IR/ROXICODONE) immediate release tablet 10 mg  10 mg Oral Q6H PRN Mariel Aloe, MD   10 mg at 01/21/17 2353  . pantoprazole (PROTONIX) EC tablet 40 mg  40 mg Oral Daily Mariel Aloe, MD   40 mg at 01/22/17 0844  . polyvinyl alcohol (LIQUIFILM TEARS) 1.4 % ophthalmic solution 2 drop  2 drop Both Eyes BID Mariel Aloe, MD   2 drop at 01/22/17 0847  . potassium chloride SA (K-DUR,KLOR-CON) CR tablet 20 mEq  20 mEq Oral BID Mariel Aloe, MD   20 mEq at 01/22/17 0844  . predniSONE (DELTASONE) tablet 40 mg  40 mg Oral Q48H Mariel Aloe, MD   40 mg at 01/21/17 1140  . predniSONE (DELTASONE) tablet 60 mg  60 mg Oral Q48H Mariel Aloe, MD   60 mg at 01/22/17 0845  . sodium chloride flush (NS) 0.9 % injection 3 mL  3 mL Intravenous Q12H Mariel Aloe, MD   3 mL at 01/22/17 0847  . torsemide (DEMADEX) tablet 80 mg  80 mg Oral Daily Mariel Aloe, MD   80 mg at 01/22/17 0844  . valACYclovir (VALTREX) tablet 500 mg  500 mg Oral Daily Mariel Aloe, MD   500 mg at 01/22/17 7841     Discharge Medications: Please see discharge summary for a list of discharge medications.  Relevant Imaging Results:  Relevant Lab Results:   Additional Information SSN [id number]  Burnis Medin, LCSW

## 2017-01-22 NOTE — Progress Notes (Signed)
Report given to Lelon Frohlich at [place].  Pt voiding post foley removal.  At baseline on 4L O2.

## 2017-01-22 NOTE — Progress Notes (Signed)
PT Cancellation Note  Patient Details Name: [name] [name] MRN: [id number] DOB: 1931-07-29   Cancelled Treatment:    Reason Eval/Treat Not Completed: PT screened, no needs identified, will sign off. Attempted PT eval-pt declined. Pt is a long term care resident at [facility] and plans to return. Will sign off and defer PT eval to SNF if warranted. Thanks.    Weston Anna, MPT Pager: [phone]

## 2017-01-23 LAB — CULTURE, BLOOD (ROUTINE X 2)
Culture: NO GROWTH
Culture: NO GROWTH
Special Requests: ADEQUATE
Special Requests: ADEQUATE

## 2017-01-24 ENCOUNTER — Telehealth: Payer: Self-pay | Admitting: *Deleted

## 2017-01-24 NOTE — Telephone Encounter (Signed)
Received phone call from Teressa Lower at patient's care facility. She was reporting a critical platelet level of 39. Patient has an appointment here tomorrow.   Dr Marin Olp notified. Will assess patient tomorrow when in the office.

## 2017-01-25 ENCOUNTER — Ambulatory Visit: Payer: Medicare Other

## 2017-01-25 ENCOUNTER — Other Ambulatory Visit (HOSPITAL_BASED_OUTPATIENT_CLINIC_OR_DEPARTMENT_OTHER): Payer: Medicare Other

## 2017-01-25 ENCOUNTER — Ambulatory Visit (HOSPITAL_BASED_OUTPATIENT_CLINIC_OR_DEPARTMENT_OTHER): Payer: Medicare Other | Admitting: Family

## 2017-01-25 VITALS — BP 120/49 | HR 63 | Temp 98.1°F | Resp 18

## 2017-01-25 DIAGNOSIS — D696 Thrombocytopenia, unspecified: Secondary | ICD-10-CM

## 2017-01-25 DIAGNOSIS — D693 Immune thrombocytopenic purpura: Secondary | ICD-10-CM

## 2017-01-25 DIAGNOSIS — I872 Venous insufficiency (chronic) (peripheral): Secondary | ICD-10-CM

## 2017-01-25 LAB — COMPREHENSIVE METABOLIC PANEL
ALT: 11 U/L (ref 0–55)
ANION GAP: 14 meq/L — AB (ref 3–11)
AST: 9 U/L (ref 5–34)
Albumin: 2.9 g/dL — ABNORMAL LOW (ref 3.5–5.0)
Alkaline Phosphatase: 76 U/L (ref 40–150)
BUN: 35.1 mg/dL — AB (ref 7.0–26.0)
CALCIUM: 9.4 mg/dL (ref 8.4–10.4)
CHLORIDE: 94 meq/L — AB (ref 98–109)
CO2: 26 meq/L (ref 22–29)
Creatinine: 1.5 mg/dL — ABNORMAL HIGH (ref 0.6–1.1)
EGFR: 32 mL/min/{1.73_m2} — ABNORMAL LOW (ref >90)
Glucose: 238 mg/dl — ABNORMAL HIGH (ref 70–140)
POTASSIUM: 4.5 meq/L (ref 3.5–5.1)
Sodium: 135 mEq/L — ABNORMAL LOW (ref 136–145)
Total Bilirubin: 0.9 mg/dL (ref 0.20–1.20)
Total Protein: 6.7 g/dL (ref 6.4–8.3)

## 2017-01-25 LAB — CBC WITH DIFFERENTIAL (CANCER CENTER ONLY)
BASO#: 0 10*3/uL (ref 0.0–0.2)
BASO%: 0.2 % (ref 0.0–2.0)
EOS%: 0.4 % (ref 0.0–7.0)
Eosinophils Absolute: 0.1 10*3/uL (ref 0.0–0.5)
HEMATOCRIT: 44.4 % (ref 34.8–46.6)
HGB: 13.7 g/dL (ref 11.6–15.9)
LYMPH#: 1.5 10*3/uL (ref 0.9–3.3)
LYMPH%: 8.2 % — ABNORMAL LOW (ref 14.0–48.0)
MCH: 25.4 pg — ABNORMAL LOW (ref 26.0–34.0)
MCHC: 30.9 g/dL — ABNORMAL LOW (ref 32.0–36.0)
MCV: 82 fL (ref 81–101)
MONO#: 0.7 10*3/uL (ref 0.1–0.9)
MONO%: 4 % (ref 0.0–13.0)
NEUT%: 87.2 % — ABNORMAL HIGH (ref 39.6–80.0)
NEUTROS ABS: 16.1 10*3/uL — AB (ref 1.5–6.5)
Platelets: 74 10*3/uL — ABNORMAL LOW (ref 145–400)
RBC: 5.39 10*6/uL — AB (ref 3.70–5.32)
RDW: 22.7 % — ABNORMAL HIGH (ref 11.1–15.7)
WBC: 18.5 10*3/uL — AB (ref 3.9–10.0)

## 2017-01-25 NOTE — Progress Notes (Signed)
Hematology and Oncology Follow Up Visit  [name] [name] [id number] 1931-07-09 83 y.o. 01/25/2017   Principle Diagnosis:  ITP - relapsed  Current Therapy:   Prednisone 60 mg PO daily    Interim History:  [name] [name] is here today with her care giver for post hospital follow-up. She is a little tearful today speaking about her hospitalization. It sounds as though she had a change in LOC with over medication with MS contin. This was discontinued. She also had some fluid overload treated with Lasix. During this time her platelet count dropped to 26,000. Her Prednisone was increased back to 60 mg PO daily and count today is back up to 74,000. We will have her continue on this same regimen.  She has bruises of both arms that are healing nicely. No episodes of bleeding or petechiae.  No anemia and WBC count is mildly elevated with the steroid.  She has had no fever, chills, n/v, cough, rash, dizziness, chest pain, palpitations, abdominal pain or changes in bowel or bladder habits.  Her SOB with exertion is unchanged. She continues to do well on 4 L [place] supplemental O2 24 hours a day. No lymphadenopathy found on exam.  Her legs are open to air now and wounds have closed nicely. She has hyper pigmentation of the lower extremities due to venous insufficiency. No c/o numbness or tingling in her extremities at this time.   ECOG Performance Status: 2 - Symptomatic, <50% confined to bed  Medications:  Allergies as of 01/25/2017      Reactions   Keflex [cephalexin] Other (See Comments)   Reaction:  Unknown       Medication List       Accurate as of 01/25/17  9:23 AM. Always use your most recent med list.          ACIDOPHILUS/CITRUS PECTIN Tabs Take 1 tablet by mouth every evening.   albuterol (2.5 MG/3ML) 0.083% nebulizer solution Commonly known as:  PROVENTIL Take 2.5 mg by nebulization every 6 (six) hours as needed for wheezing or shortness of breath.   CALCIUM 600+D 600-200 MG-UNIT  Tabs Generic drug:  Calcium Carbonate-Vitamin D Take 2 tablets by mouth daily.   ciprofloxacin 250 MG tablet Commonly known as:  CIPRO Take 1 tablet (250 mg total) by mouth 2 (two) times daily.   digoxin 0.125 MG tablet Commonly known as:  LANOXIN Take 0.0625 mg by mouth daily.   diltiazem 240 MG 24 hr capsule Commonly known as:  CARDIZEM CD Take 1 capsule (240 mg total) by mouth daily.   famciclovir 250 MG tablet Commonly known as:  FAMVIR Take 1 tablet (250 mg total) by mouth daily.   insulin lispro 100 UNIT/ML injection Commonly known as:  HUMALOG Inject 0-15 Units into the skin 3 (three) times daily as needed for high blood sugar. Pt uses as needed per sliding scale:  70-120:  0 units, 121-150:  2 units, 151-200:  3 units, 201-250:  5 units, 251-300:  8 units, 301-350:  11 units, 351-400:  15 units, Greater than 400:  Call MD   metoprolol 100 MG tablet Commonly known as:  LOPRESSOR Take 1 tablet (100 mg total) by mouth 2 (two) times daily.   Oxycodone HCl 10 MG Tabs Take 1 tablet (10 mg total) by mouth every 6 (six) hours as needed (pain).   pantoprazole 40 MG tablet Commonly known as:  PROTONIX Take 1 tablet (40 mg total) by mouth daily.   potassium chloride SA 20 MEQ  tablet Commonly known as:  K-DUR,KLOR-CON Take 1 tablet (20 mEq total) by mouth 2 (two) times daily.   predniSONE 20 MG tablet Commonly known as:  DELTASONE Take 3 tablets (60 mg total) by mouth daily with breakfast.   sitaGLIPtin 50 MG tablet Commonly known as:  JANUVIA Take 50 mg by mouth daily.   sodium chloride 0.65 % Soln nasal spray Commonly known as:  OCEAN Place 1 spray into both nostrils 3 (three) times daily.   SYSTANE 0.4-0.3 % Soln Generic drug:  Polyethyl Glycol-Propyl Glycol Place 2 drops into both eyes 2 (two) times daily.   torsemide 20 MG tablet Commonly known as:  DEMADEX Take 4 tablets (80 mg total) by mouth 2 (two) times daily.       Allergies:  Allergies  Allergen  Reactions  . Keflex [Cephalexin] Other (See Comments)    Reaction:  Unknown     Past Medical History, Surgical history, Social history, and Family History were reviewed and updated.  Review of Systems: All other 10 point review of systems is negative.   Physical Exam:  vitals were not taken for this visit.  Wt Readings from Last 3 Encounters:  01/22/17 187 lb 2.7 oz (84.9 kg)  11/23/16 197 lb 3.2 oz (89.4 kg)  11/02/16 197 lb 12.8 oz (89.7 kg)    Ocular: Sclerae unicteric, pupils equal, round and reactive to light Ear-nose-throat: Oropharynx clear, dentition fair Lymphatic: No cervical, supraclavicular or axillary adenopathy Lungs no rales or rhonchi, good excursion bilaterally Heart regular rate and rhythm, no murmur appreciated Abd soft, nontender, positive bowel sounds, no liver or spleen tip palpated on exam, no fluid wave  MSK no focal spinal tenderness, no joint edema Neuro: non-focal, well-oriented, appropriate affect Breasts: Deferred  Lab Results  Component Value Date   WBC 10.3 01/22/2017   HGB 11.9 (L) 01/22/2017   HCT 38.9 01/22/2017   MCV 83.7 01/22/2017   PLT 26 (LL) 01/22/2017   No results found for: FERRITIN, IRON, TIBC, UIBC, IRONPCTSAT Lab Results  Component Value Date   RBC 4.65 01/22/2017   No results found for: KPAFRELGTCHN, LAMBDASER, KAPLAMBRATIO No results found for: IGGSERUM, IGA, IGMSERUM No results found for: Odetta Pink, SPEI   Chemistry      Component Value Date/Time   NA 138 01/22/2017 0531   NA 132 (L) 01/09/2017 1420   NA 138 12/18/2016 0910   K 4.1 01/22/2017 0531   K 5.5 (H) 01/09/2017 1420   K 4.1 12/18/2016 0910   CL 95 (L) 01/22/2017 0531   CL 87 (L) 01/09/2017 1420   CL 98 10/15/2016 1121   CO2 32 01/22/2017 0531   CO2 31 (H) 01/09/2017 1420   CO2 28 12/18/2016 0910   BUN 25 (H) 01/22/2017 0531   BUN 63 (H) 01/09/2017 1420   BUN 65.5 (H) 12/18/2016 0910    CREATININE 1.07 (H) 01/22/2017 0531   CREATININE 1.35 (H) 01/09/2017 1420   CREATININE 1.6 (H) 12/18/2016 0910      Component Value Date/Time   CALCIUM 8.8 (L) 01/22/2017 0531   CALCIUM 9.1 01/09/2017 1420   CALCIUM 9.8 12/18/2016 0910   ALKPHOS 75 01/18/2017 1435   ALKPHOS 77 01/09/2017 1420   ALKPHOS 63 12/18/2016 0910   AST 27 01/18/2017 1435   AST 11 01/09/2017 1420   AST 13 12/18/2016 0910   ALT 15 01/18/2017 1435   ALT 12 01/09/2017 1420   ALT 13 12/18/2016 0910   BILITOT  1.6 (H) 01/18/2017 1435   BILITOT 0.5 01/09/2017 1420   BILITOT 0.59 12/18/2016 0910      Impression and Plan: Ms. Klinke is a very pleasant 83 yo caucasian female with relapsed ITP. She was in the hospital last weak due to altered LOC with over use of pain medication and fluid overload.  The MS contin was stopped and she also received lasix. Her platelet count dropped during admission to 26 and Prednisone was increased back to 60 mg PO daily. She has responded nicely to this regimen and platelet count today is 74. No anemia at this time.  We will have her continue this same regimen. IVIG was discussed but there is concern of fluid overload with infusion. We will hold off on this for now.  We will plan to see her back for repeat lab work and follow-up.  Her assisted living facility is good to contact our office with any questions or concerns. We can certainly see her sooner if need be.   Eliezer Bottom, NP 5/11/20189:23 AM

## 2017-01-31 ENCOUNTER — Encounter: Payer: Self-pay | Admitting: Hematology & Oncology

## 2017-02-12 ENCOUNTER — Ambulatory Visit (HOSPITAL_BASED_OUTPATIENT_CLINIC_OR_DEPARTMENT_OTHER): Payer: Medicare Other | Admitting: Family

## 2017-02-12 ENCOUNTER — Other Ambulatory Visit (HOSPITAL_BASED_OUTPATIENT_CLINIC_OR_DEPARTMENT_OTHER): Payer: Medicare Other

## 2017-02-12 VITALS — BP 119/72 | HR 72 | Temp 97.7°F | Resp 19

## 2017-02-12 DIAGNOSIS — L218 Other seborrheic dermatitis: Secondary | ICD-10-CM | POA: Diagnosis not present

## 2017-02-12 DIAGNOSIS — D693 Immune thrombocytopenic purpura: Secondary | ICD-10-CM | POA: Diagnosis present

## 2017-02-12 DIAGNOSIS — D696 Thrombocytopenia, unspecified: Secondary | ICD-10-CM

## 2017-02-12 DIAGNOSIS — M7989 Other specified soft tissue disorders: Secondary | ICD-10-CM | POA: Diagnosis not present

## 2017-02-12 LAB — COMPREHENSIVE METABOLIC PANEL (CC13)
ALK PHOS: 63 IU/L (ref 39–117)
ALT: 12 IU/L (ref 0–32)
AST: 12 IU/L (ref 0–40)
Albumin, Serum: 3.5 g/dL (ref 3.5–4.7)
Albumin/Globulin Ratio: 1.5 (ref 1.2–2.2)
BILIRUBIN TOTAL: 0.5 mg/dL (ref 0.0–1.2)
BUN/Creatinine Ratio: 33 — ABNORMAL HIGH (ref 12–28)
BUN: 45 mg/dL — ABNORMAL HIGH (ref 8–27)
CHLORIDE: 99 mmol/L (ref 96–106)
Calcium, Ser: 8.9 mg/dL (ref 8.7–10.3)
Carbon Dioxide, Total: 27 mmol/L (ref 18–29)
Creatinine, Ser: 1.37 mg/dL — ABNORMAL HIGH (ref 0.57–1.00)
GFR calc Af Amer: 41 mL/min/{1.73_m2} — ABNORMAL LOW (ref >59)
GFR calc non Af Amer: 36 mL/min/{1.73_m2} — ABNORMAL LOW (ref >59)
Globulin, Total: 2.4 g/dL (ref 1.5–4.5)
Glucose: 284 mg/dL — ABNORMAL HIGH (ref 65–99)
Potassium, Ser: 4.8 mmol/L (ref 3.5–5.2)
Sodium: 134 mmol/L (ref 134–144)
Total Protein: 5.9 g/dL — ABNORMAL LOW (ref 6.0–8.5)

## 2017-02-12 LAB — CBC WITH DIFFERENTIAL (CANCER CENTER ONLY)
BASO#: 0 10*3/uL (ref 0.0–0.2)
BASO%: 0.2 % (ref 0.0–2.0)
EOS%: 0 % (ref 0.0–7.0)
Eosinophils Absolute: 0 10*3/uL (ref 0.0–0.5)
HCT: 48.6 % — ABNORMAL HIGH (ref 34.8–46.6)
HGB: 15.5 g/dL (ref 11.6–15.9)
LYMPH#: 0.5 10*3/uL — ABNORMAL LOW (ref 0.9–3.3)
LYMPH%: 2.1 % — AB (ref 14.0–48.0)
MCH: 27.4 pg (ref 26.0–34.0)
MCHC: 31.9 g/dL — AB (ref 32.0–36.0)
MCV: 86 fL (ref 81–101)
MONO#: 0.3 10*3/uL (ref 0.1–0.9)
MONO%: 1.2 % (ref 0.0–13.0)
NEUT#: 22.7 10*3/uL — ABNORMAL HIGH (ref 1.5–6.5)
NEUT%: 96.5 % — AB (ref 39.6–80.0)
PLATELETS: 55 10*3/uL — AB (ref 145–400)
RBC: 5.66 10*6/uL — ABNORMAL HIGH (ref 3.70–5.32)
RDW: 23.2 % — AB (ref 11.1–15.7)
WBC: 23.5 10*3/uL — ABNORMAL HIGH (ref 3.9–10.0)

## 2017-02-12 LAB — TECHNOLOGIST REVIEW CHCC SATELLITE

## 2017-02-12 NOTE — Progress Notes (Signed)
Hematology and Oncology Follow Up Visit  MATTISON GOLAY [id number] 1931-01-19 83 y.o. 02/12/2017   Principle Diagnosis:  ITP - relapsed  Current Therapy:   Prednisone 60 mg PO daily  Will start IVIG next week - June 2018   Interim History:  Ms. Weightman is here today with her care giver for follow-up. She is feeling a little fatigued and states that she "hurts all over." These issues are not new for her and are unchanged.  Platelet count is 55 today. She is taking her Prednisone 60 mg PO daily as prescribed. We will now look at trying her on IVIG.  She does bruise easily. No episodes of bleeding and no petechiae.  No lymphadenopathy found on exam.  No fever, chills, n/v, cough, rash, dizziness, chest pain, palpitations, abdominal pain or changes in bowel or bladder habits.  She is doing well on 4 L [place] supplemental O2. SOB with over exertion is unchanged.  She has chronic swelling and hyper pigmentation of her lower extremities. No numbness or tingling in her extremities at this time.  She has maintained a good appetite and is staying well hydrated. She would not stand for weight today.   ECOG Performance Status: 1 - Symptomatic but completely ambulatory  Medications:  Allergies as of 02/12/2017      Reactions   Keflex [cephalexin] Other (See Comments)   Reaction:  Unknown       Medication List       Accurate as of 02/12/17  3:35 PM. Always use your most recent med list.          ACIDOPHILUS/CITRUS PECTIN Tabs Take 1 tablet by mouth every evening.   albuterol (2.5 MG/3ML) 0.083% nebulizer solution Commonly known as:  PROVENTIL Take 2.5 mg by nebulization every 6 (six) hours as needed for wheezing or shortness of breath.   CALCIUM 600+D 600-200 MG-UNIT Tabs Generic drug:  Calcium Carbonate-Vitamin D Take 2 tablets by mouth daily.   ciprofloxacin 250 MG tablet Commonly known as:  CIPRO Take 1 tablet (250 mg total) by mouth 2 (two) times daily.   digoxin 0.125 MG  tablet Commonly known as:  LANOXIN Take 0.0625 mg by mouth daily.   diltiazem 240 MG 24 hr capsule Commonly known as:  CARDIZEM CD Take 1 capsule (240 mg total) by mouth daily.   famciclovir 250 MG tablet Commonly known as:  FAMVIR Take 1 tablet (250 mg total) by mouth daily.   insulin lispro 100 UNIT/ML injection Commonly known as:  HUMALOG Inject 0-15 Units into the skin 3 (three) times daily as needed for high blood sugar. Pt uses as needed per sliding scale:  70-120:  0 units, 121-150:  2 units, 151-200:  3 units, 201-250:  5 units, 251-300:  8 units, 301-350:  11 units, 351-400:  15 units, Greater than 400:  Call MD   metoprolol tartrate 100 MG tablet Commonly known as:  LOPRESSOR Take 1 tablet (100 mg total) by mouth 2 (two) times daily.   morphine 10 MG 24 hr capsule Commonly known as:  KADIAN   naloxone 0.4 MG/ML injection Commonly known as:  NARCAN   OXYCONTIN 10 mg 12 hr tablet Generic drug:  oxyCODONE   pantoprazole 40 MG tablet Commonly known as:  PROTONIX Take 1 tablet (40 mg total) by mouth daily.   potassium chloride SA 20 MEQ tablet Commonly known as:  K-DUR,KLOR-CON Take 1 tablet (20 mEq total) by mouth 2 (two) times daily.   predniSONE 20 MG tablet  Commonly known as:  DELTASONE Take 3 tablets (60 mg total) by mouth daily with breakfast.   sitaGLIPtin 50 MG tablet Commonly known as:  JANUVIA Take 50 mg by mouth daily.   sodium chloride 0.65 % Soln nasal spray Commonly known as:  OCEAN Place 1 spray into both nostrils 3 (three) times daily.   SYSTANE 0.4-0.3 % Soln Generic drug:  Polyethyl Glycol-Propyl Glycol Place 2 drops into both eyes 2 (two) times daily.   torsemide 20 MG tablet Commonly known as:  DEMADEX Take 4 tablets (80 mg total) by mouth 2 (two) times daily.       Allergies:  Allergies  Allergen Reactions  . Keflex [Cephalexin] Other (See Comments)    Reaction:  Unknown     Past Medical History, Surgical history, Social  history, and Family History were reviewed and updated.  Review of Systems: All other 10 point review of systems is negative.   Physical Exam:  oral temperature is 97.7 F (36.5 C). Her blood pressure is 119/72 and her pulse is 72. Her respiration is 19 and oxygen saturation is 100%.   Wt Readings from Last 3 Encounters:  01/22/17 187 lb 2.7 oz (84.9 kg)  11/23/16 197 lb 3.2 oz (89.4 kg)  11/02/16 197 lb 12.8 oz (89.7 kg)    Ocular: Sclerae unicteric, pupils equal, round and reactive to light Ear-nose-throat: Oropharynx clear, dentition fair Lymphatic: No cervical, supraclavicular or axillary adenopathy Lungs no rales or rhonchi, good excursion bilaterally Heart regular rate and rhythm, no murmur appreciated Abd soft, nontender, positive bowel sounds, no liver or spleen tip palpated on exam, no fluid wave MSK no focal spinal tenderness, no joint edema Neuro: non-focal, well-oriented, appropriate affect Breasts: Deferred  Lab Results  Component Value Date   WBC 23.5 (H) 02/12/2017   HGB 15.5 02/12/2017   HCT 48.6 (H) 02/12/2017   MCV 86 02/12/2017   PLT 55 (L) 02/12/2017   No results found for: FERRITIN, IRON, TIBC, UIBC, IRONPCTSAT Lab Results  Component Value Date   RBC 5.66 (H) 02/12/2017   No results found for: KPAFRELGTCHN, LAMBDASER, KAPLAMBRATIO No results found for: IGGSERUM, IGA, IGMSERUM No results found for: Kathrynn Ducking, MSPIKE, SPEI   Chemistry      Component Value Date/Time   NA 135 (L) 01/25/2017 0906   K 4.5 01/25/2017 0906   CL 95 (L) 01/22/2017 0531   CL 87 (L) 01/09/2017 1420   CL 98 10/15/2016 1121   CO2 26 01/25/2017 0906   BUN 35.1 (H) 01/25/2017 0906   CREATININE 1.5 (H) 01/25/2017 0906      Component Value Date/Time   CALCIUM 9.4 01/25/2017 0906   ALKPHOS 76 01/25/2017 0906   AST 9 01/25/2017 0906   ALT 11 01/25/2017 0906   BILITOT 0.90 01/25/2017 0906      Impression and Plan: Ms.  Rothman is a very pleasant 84 yo caucasian female with relapsed ITP. Platelet count is 55 on Prednisone 60 mg PO daily. We will now plan to switch her over to IVIG. She will continue on Prednisone for this week and she will start IVIG next week.  We will plan to see her back in 3 weeks for repeat lab work and follow-up.  Both she and her assisted living facility know to contact our office with any questions or concerns. We can certainly see her sooner if need be.   Eliezer Bottom, NP 5/29/20183:35 PM

## 2017-02-13 LAB — LACTATE DEHYDROGENASE: LDH: 132 U/L (ref 125–245)

## 2017-02-20 ENCOUNTER — Ambulatory Visit (HOSPITAL_BASED_OUTPATIENT_CLINIC_OR_DEPARTMENT_OTHER): Payer: Medicare Other

## 2017-02-20 VITALS — BP 119/57 | HR 62 | Temp 97.4°F | Resp 20

## 2017-02-20 DIAGNOSIS — D693 Immune thrombocytopenic purpura: Secondary | ICD-10-CM | POA: Diagnosis present

## 2017-02-20 MED ORDER — SODIUM CHLORIDE 0.9 % IV SOLN
Freq: Once | INTRAVENOUS | Status: AC
  Administered 2017-02-20: 10:00:00 via INTRAVENOUS

## 2017-02-20 MED ORDER — ACETAMINOPHEN 325 MG PO TABS
650.0000 mg | ORAL_TABLET | Freq: Once | ORAL | Status: AC
  Administered 2017-02-20: 650 mg via ORAL

## 2017-02-20 MED ORDER — DIPHENHYDRAMINE HCL 25 MG PO CAPS
ORAL_CAPSULE | ORAL | Status: AC
  Filled 2017-02-20: qty 1

## 2017-02-20 MED ORDER — ACETAMINOPHEN 325 MG PO TABS
ORAL_TABLET | ORAL | Status: AC
  Filled 2017-02-20: qty 2

## 2017-02-20 MED ORDER — IMMUNE GLOBULIN (HUMAN) 10 GM/100ML IV SOLN
80.0000 g | Freq: Once | INTRAVENOUS | Status: AC
  Administered 2017-02-20: 80 g via INTRAVENOUS
  Filled 2017-02-20: qty 800

## 2017-02-20 MED ORDER — DIPHENHYDRAMINE HCL 25 MG PO TABS
25.0000 mg | ORAL_TABLET | Freq: Once | ORAL | Status: AC
  Administered 2017-02-20: 25 mg via ORAL
  Filled 2017-02-20: qty 1

## 2017-02-20 NOTE — Patient Instructions (Signed)

## 2017-02-25 ENCOUNTER — Other Ambulatory Visit (HOSPITAL_BASED_OUTPATIENT_CLINIC_OR_DEPARTMENT_OTHER): Payer: Medicare Other

## 2017-02-25 ENCOUNTER — Ambulatory Visit (HOSPITAL_BASED_OUTPATIENT_CLINIC_OR_DEPARTMENT_OTHER): Payer: Medicare Other | Admitting: Family

## 2017-02-25 VITALS — BP 145/75 | HR 77 | Temp 97.8°F | Resp 17

## 2017-02-25 DIAGNOSIS — D696 Thrombocytopenia, unspecified: Secondary | ICD-10-CM

## 2017-02-25 DIAGNOSIS — M7989 Other specified soft tissue disorders: Secondary | ICD-10-CM | POA: Diagnosis not present

## 2017-02-25 DIAGNOSIS — D693 Immune thrombocytopenic purpura: Secondary | ICD-10-CM

## 2017-02-25 LAB — CBC WITH DIFFERENTIAL (CANCER CENTER ONLY)
HEMATOCRIT: 48 % — AB (ref 34.8–46.6)
HGB: 15.8 g/dL (ref 11.6–15.9)
MCH: 28.6 pg (ref 26.0–34.0)
MCHC: 32.9 g/dL (ref 32.0–36.0)
MCV: 87 fL (ref 81–101)
RBC: 5.52 10*6/uL — ABNORMAL HIGH (ref 3.70–5.32)
RDW: 22.1 % — AB (ref 11.1–15.7)
WBC: 17.5 10*3/uL — AB (ref 3.9–10.0)

## 2017-02-25 LAB — MANUAL DIFFERENTIAL (CHCC SATELLITE)
ALC: 0.9 10*3/uL (ref 0.6–2.2)
ANC (CHCC HP manual diff): 16.3 10*3/uL — ABNORMAL HIGH (ref 1.5–6.7)
BAND NEUTROPHILS: 2 % (ref 0–10)
EOS: 1 % (ref 0–7)
LYMPH: 5 % — AB (ref 14–48)
MONO: 1 % (ref 0–13)
PLT EST ~~LOC~~: DECREASED
SEG: 91 % — AB (ref 40–75)

## 2017-02-25 LAB — COMPREHENSIVE METABOLIC PANEL
ALT: 23 U/L (ref 0–55)
AST: 19 U/L (ref 5–34)
Albumin: 3.3 g/dL — ABNORMAL LOW (ref 3.5–5.0)
Alkaline Phosphatase: 91 U/L (ref 40–150)
Anion Gap: 15 mEq/L — ABNORMAL HIGH (ref 3–11)
BUN: 49.1 mg/dL — AB (ref 7.0–26.0)
CALCIUM: 9.3 mg/dL (ref 8.4–10.4)
CHLORIDE: 96 meq/L — AB (ref 98–109)
CO2: 25 meq/L (ref 22–29)
CREATININE: 1.6 mg/dL — AB (ref 0.6–1.1)
EGFR: 30 mL/min/{1.73_m2} — ABNORMAL LOW (ref >90)
GLUCOSE: 254 mg/dL — AB (ref 70–140)
POTASSIUM: 4.4 meq/L (ref 3.5–5.1)
SODIUM: 136 meq/L (ref 136–145)
Total Bilirubin: 0.83 mg/dL (ref 0.20–1.20)
Total Protein: 7.3 g/dL (ref 6.4–8.3)

## 2017-02-25 LAB — LACTATE DEHYDROGENASE: LDH: 164 U/L (ref 125–245)

## 2017-02-25 MED ORDER — FOSTAMATINIB DISODIUM 100 MG PO TABS: 100.0000 mg | ORAL_TABLET | Freq: Two times a day (BID) | ORAL | 2 refills | Status: DC

## 2017-02-25 NOTE — Progress Notes (Signed)
Hematology and Oncology Follow Up Visit  [name] [name] [id number] 03-27-1931 83 y.o. 02/25/2017   Principle Diagnosis:  Chronic ITP  Current Therapy:   IVIG infusion as indicated - received first dose on 02/20/2017   Interim History:  [name] [name] is here today with her care giver for follow-up. She received her first IVIG infusion last week and had a nice response. Her platelet count is 115.  I spoke with Dr. Marin Olp and a new TKI has been approved in the treatment of patient's with ITP. We will have her start Tavalisse 100 mg PO BID and see how she responds.  She denies having any episodes of bleeding. Her bruises have healed, no petechiae.  No lymphadenopathy found on exam.  No fever, chills, n/v, cough, rash, dizziness, SOB, chest pain, palpitations, abdominal pain or changes in bowel or bladder habits.  She has chronic swelling in both lower extremities. There is hyperpigmentation of both legs due to chronic vascular issues. +2 pitting edema present in feet. Pedal pulses are +1.  No c/o numbness and tingling her extremities.  She has maintained a good appetite and is staying well hydrated. She refused a weight today.   ECOG Performance Status: 1 - Symptomatic but completely ambulatory  Medications:  Allergies as of 02/25/2017      Reactions   Keflex [cephalexin] Other (See Comments)   Reaction:  Unknown       Medication List       Accurate as of 02/25/17  1:44 PM. Always use your most recent med list.          ACIDOPHILUS/CITRUS PECTIN Tabs Take 1 tablet by mouth every evening.   albuterol (2.5 MG/3ML) 0.083% nebulizer solution Commonly known as:  PROVENTIL Take 2.5 mg by nebulization every 6 (six) hours as needed for wheezing or shortness of breath.   CALCIUM 600+D 600-200 MG-UNIT Tabs Generic drug:  Calcium Carbonate-Vitamin D Take 2 tablets by mouth daily.   ciprofloxacin 250 MG tablet Commonly known as:  CIPRO Take 1 tablet (250 mg total) by mouth 2 (two)  times daily.   digoxin 0.125 MG tablet Commonly known as:  LANOXIN Take 0.0625 mg by mouth daily.   diltiazem 240 MG 24 hr capsule Commonly known as:  CARDIZEM CD Take 1 capsule (240 mg total) by mouth daily.   famciclovir 250 MG tablet Commonly known as:  FAMVIR Take 1 tablet (250 mg total) by mouth daily.   Fostamatinib Disodium 100 MG Tabs Commonly known as:  TAVALISSE Take 100 mg by mouth 2 (two) times daily.   insulin lispro 100 UNIT/ML injection Commonly known as:  HUMALOG Inject 0-15 Units into the skin 3 (three) times daily as needed for high blood sugar. Pt uses as needed per sliding scale:  70-120:  0 units, 121-150:  2 units, 151-200:  3 units, 201-250:  5 units, 251-300:  8 units, 301-350:  11 units, 351-400:  15 units, Greater than 400:  Call MD   metoprolol tartrate 100 MG tablet Commonly known as:  LOPRESSOR Take 1 tablet (100 mg total) by mouth 2 (two) times daily.   morphine 10 MG 24 hr capsule Commonly known as:  KADIAN   naloxone 0.4 MG/ML injection Commonly known as:  NARCAN   OXYCONTIN 10 mg 12 hr tablet Generic drug:  oxyCODONE   pantoprazole 40 MG tablet Commonly known as:  PROTONIX Take 1 tablet (40 mg total) by mouth daily.   potassium chloride SA 20 MEQ tablet Commonly known as:  K-DUR,KLOR-CON Take 1 tablet (20 mEq total) by mouth 2 (two) times daily.   predniSONE 20 MG tablet Commonly known as:  DELTASONE Take 3 tablets (60 mg total) by mouth daily with breakfast.   sitaGLIPtin 50 MG tablet Commonly known as:  JANUVIA Take 50 mg by mouth daily.   sodium chloride 0.65 % Soln nasal spray Commonly known as:  OCEAN Place 1 spray into both nostrils 3 (three) times daily.   SYSTANE 0.4-0.3 % Soln Generic drug:  Polyethyl Glycol-Propyl Glycol Place 2 drops into both eyes 2 (two) times daily.   torsemide 20 MG tablet Commonly known as:  DEMADEX Take 4 tablets (80 mg total) by mouth 2 (two) times daily.       Allergies:  Allergies    Allergen Reactions  . Keflex [Cephalexin] Other (See Comments)    Reaction:  Unknown     Past Medical History, Surgical history, Social history, and Family History were reviewed and updated.  Review of Systems: All other 10 point review of systems is negative.   Physical Exam:  oral temperature is 97.8 F (36.6 C). Her blood pressure is 145/75 (abnormal) and her pulse is 77. Her respiration is 17 and oxygen saturation is 96%.   Wt Readings from Last 3 Encounters:  01/22/17 187 lb 2.7 oz (84.9 kg)  11/23/16 197 lb 3.2 oz (89.4 kg)  11/02/16 197 lb 12.8 oz (89.7 kg)    Ocular: Sclerae unicteric, pupils equal, round and reactive to light Ear-nose-throat: Oropharynx clear, dentition fair Lymphatic: No cervical, supraclavicular or axillary adenopathy Lungs no rales or rhonchi, good excursion bilaterally Heart regular rate and rhythm, no murmur appreciated Abd soft, nontender, positive bowel sounds, no liver or spleen tip palpated on exam, no fluid wave MSK no focal spinal tenderness, no joint edema Neuro: non-focal, well-oriented, appropriate affect Breasts: Deferred   Lab Results  Component Value Date   WBC 17.5 (H) 02/25/2017   HGB 15.8 02/25/2017   HCT 48.0 (H) 02/25/2017   MCV 87 02/25/2017   PLT 115 Platelet count consistent in citrate (L) 02/25/2017   No results found for: FERRITIN, IRON, TIBC, UIBC, IRONPCTSAT Lab Results  Component Value Date   RBC 5.52 (H) 02/25/2017   No results found for: KPAFRELGTCHN, LAMBDASER, KAPLAMBRATIO No results found for: IGGSERUM, IGA, IGMSERUM No results found for: Odetta Pink, SPEI   Chemistry      Component Value Date/Time   NA 134 02/12/2017 1356   NA 135 (L) 01/25/2017 0906   K 4.8 02/12/2017 1356   K 4.5 01/25/2017 0906   CL 99 02/12/2017 1356   CL 98 10/15/2016 1121   CO2 27 02/12/2017 1356   CO2 26 01/25/2017 0906   BUN 45 (H) 02/12/2017 1356   BUN 35.1 (H)  01/25/2017 0906   CREATININE 1.37 (H) 02/12/2017 1356   CREATININE 1.5 (H) 01/25/2017 0906      Component Value Date/Time   CALCIUM 8.9 02/12/2017 1356   CALCIUM 9.4 01/25/2017 0906   ALKPHOS 63 02/12/2017 1356   ALKPHOS 76 01/25/2017 0906   AST 12 02/12/2017 1356   AST 9 01/25/2017 0906   ALT 12 02/12/2017 1356   ALT 11 01/25/2017 0906   BILITOT 0.5 02/12/2017 1356   BILITOT 0.90 01/25/2017 0906      Impression and Plan: Ms. Larmore is a very pleasant 83 yo caucasian female with ITP. She received IVIG for the first time last week and has had a nice  response. Her platelet count is 115 and Hgb is stable at 15.8.  We will start her on the new TKI Tavalisse for ITP. She will start at 100 mg PO BID. I filled out the paper work for her assisted living facility including her [facility] prescription and labs. I also gave detailed instructions on what side effects to watch for and for them to call our office with any questions or concerns. We can certainly see her sooner if need be.  We will plan to see her back in 2 weeks for repeat lab work and follow-up.   Eliezer Bottom, NP 6/11/20181:44 PM

## 2017-03-15 ENCOUNTER — Ambulatory Visit: Payer: Medicare Other

## 2017-03-15 ENCOUNTER — Other Ambulatory Visit (HOSPITAL_BASED_OUTPATIENT_CLINIC_OR_DEPARTMENT_OTHER): Payer: Medicare Other

## 2017-03-15 ENCOUNTER — Ambulatory Visit (HOSPITAL_BASED_OUTPATIENT_CLINIC_OR_DEPARTMENT_OTHER): Payer: Medicare Other | Admitting: Hematology & Oncology

## 2017-03-15 VITALS — BP 126/61 | HR 77 | Temp 97.8°F | Resp 18

## 2017-03-15 DIAGNOSIS — D696 Thrombocytopenia, unspecified: Secondary | ICD-10-CM

## 2017-03-15 DIAGNOSIS — D693 Immune thrombocytopenic purpura: Secondary | ICD-10-CM

## 2017-03-15 LAB — CBC WITH DIFFERENTIAL (CANCER CENTER ONLY)
HCT: 50.3 % — ABNORMAL HIGH (ref 34.8–46.6)
HEMOGLOBIN: 16.8 g/dL — AB (ref 11.6–15.9)
MCH: 30.4 pg (ref 26.0–34.0)
MCHC: 33.4 g/dL (ref 32.0–36.0)
MCV: 91 fL (ref 81–101)
Platelets: 105 10*3/uL — ABNORMAL LOW (ref 145–400)
RBC: 5.53 10*6/uL — ABNORMAL HIGH (ref 3.70–5.32)
RDW: 20.2 % — ABNORMAL HIGH (ref 11.1–15.7)
WBC: 26.6 10*3/uL — ABNORMAL HIGH (ref 3.9–10.0)

## 2017-03-15 LAB — MANUAL DIFFERENTIAL (CHCC SATELLITE)
ALC: 1.3 10*3/uL (ref 0.6–2.2)
ANC (CHCC MAN DIFF): 24.5 10*3/uL — AB (ref 1.5–6.7)
LYMPH: 5 % — AB (ref 14–48)
MONO: 3 % (ref 0–13)
PLT EST ~~LOC~~: DECREASED
SEG: 92 % — AB (ref 40–75)

## 2017-03-15 LAB — CMP (CANCER CENTER ONLY)
ALBUMIN: 3 g/dL — AB (ref 3.3–5.5)
ALT(SGPT): 22 U/L (ref 10–47)
AST: 18 U/L (ref 11–38)
Alkaline Phosphatase: 67 U/L (ref 26–84)
BUN, Bld: 39 mg/dL — ABNORMAL HIGH (ref 7–22)
CHLORIDE: 99 meq/L (ref 98–108)
CO2: 28 meq/L (ref 18–33)
CREATININE: 1.4 mg/dL — AB (ref 0.6–1.2)
Calcium: 9.3 mg/dL (ref 8.0–10.3)
Glucose, Bld: 285 mg/dL — ABNORMAL HIGH (ref 73–118)
POTASSIUM: 4.6 meq/L (ref 3.3–4.7)
SODIUM: 138 meq/L (ref 128–145)
Total Bilirubin: 1.3 mg/dl (ref 0.20–1.60)
Total Protein: 6.1 g/dL — ABNORMAL LOW (ref 6.4–8.1)

## 2017-03-15 LAB — LACTATE DEHYDROGENASE: LDH: 155 U/L (ref 125–245)

## 2017-03-15 NOTE — Progress Notes (Signed)
Hematology and Oncology Follow Up Visit  [name] [name] [id number] [birth year] 83 y.o. 03/15/2017   Principle Diagnosis:  Chronic ITP  Current Therapy:   Tavalisse 100 mg by mouth twice a day Prednisone 60 mg by mouth daily-to be tapered off over 3 weeks   Interim History:  [name] [name] is here today with her care giver for follow-up. We now have her on have on Tavalisse. She is doing okay with this.  She had some diarrhea a couple weeks ago. I'm not sure what caused this. This resolved.   Her blood sugars are quite high. This is from her prednisone. I would like to get her off prednisone. With her being onTavalisse, we will see what the platelet count does.   Overall, I think she's done pretty well.  She does have quite a few other medical problems.  ECOG Performance Status: 1 - Symptomatic but completely ambulatory  Medications:  Allergies as of 03/15/2017      Reactions   Keflex [cephalexin] Other (See Comments)   Reaction:  Unknown       Medication List       Accurate as of 03/15/17  8:39 AM. Always use your most recent med list.          ACIDOPHILUS/CITRUS PECTIN Tabs Take 1 tablet by mouth every evening.   albuterol (2.5 MG/3ML) 0.083% nebulizer solution Commonly known as:  PROVENTIL Take 2.5 mg by nebulization every 6 (six) hours as needed for wheezing or shortness of breath.   CALCIUM 600+D 600-200 MG-UNIT Tabs Generic drug:  Calcium Carbonate-Vitamin D Take 2 tablets by mouth daily.   ciprofloxacin 250 MG tablet Commonly known as:  CIPRO Take 1 tablet (250 mg total) by mouth 2 (two) times daily.   digoxin 0.125 MG tablet Commonly known as:  LANOXIN Take 0.0625 mg by mouth daily.   diltiazem 240 MG 24 hr capsule Commonly known as:  CARDIZEM CD Take 1 capsule (240 mg total) by mouth daily.   famciclovir 250 MG tablet Commonly known as:  FAMVIR Take 1 tablet (250 mg total) by mouth daily.   Fostamatinib Disodium 100 MG Tabs Commonly known as:   TAVALISSE Take 100 mg by mouth 2 (two) times daily.   insulin lispro 100 UNIT/ML injection Commonly known as:  HUMALOG Inject 0-15 Units into the skin 3 (three) times daily as needed for high blood sugar. Pt uses as needed per sliding scale:  70-120:  0 units, 121-150:  2 units, 151-200:  3 units, 201-250:  5 units, 251-300:  8 units, 301-350:  11 units, 351-400:  15 units, Greater than 400:  Call MD   metoprolol tartrate 100 MG tablet Commonly known as:  LOPRESSOR Take 1 tablet (100 mg total) by mouth 2 (two) times daily.   morphine 10 MG 24 hr capsule Commonly known as:  KADIAN   naloxone 0.4 MG/ML injection Commonly known as:  NARCAN   NOVOLOG 100 UNIT/ML injection Generic drug:  insulin aspart   OXYCONTIN 10 mg 12 hr tablet Generic drug:  oxyCODONE   pantoprazole 40 MG tablet Commonly known as:  PROTONIX Take 1 tablet (40 mg total) by mouth daily.   potassium chloride SA 20 MEQ tablet Commonly known as:  K-DUR,KLOR-CON Take 1 tablet (20 mEq total) by mouth 2 (two) times daily.   predniSONE 20 MG tablet Commonly known as:  DELTASONE Take 3 tablets (60 mg total) by mouth daily with breakfast.   sitaGLIPtin 50 MG tablet Commonly known as:  JANUVIA Take 50 mg by mouth daily.   sodium chloride 0.65 % Soln nasal spray Commonly known as:  OCEAN Place 1 spray into both nostrils 3 (three) times daily.   SYSTANE 0.4-0.3 % Soln Generic drug:  Polyethyl Glycol-Propyl Glycol Place 2 drops into both eyes 2 (two) times daily.   torsemide 10 MG tablet Commonly known as:  DEMADEX   traMADol 50 MG tablet Commonly known as:  ULTRAM       Allergies:  Allergies  Allergen Reactions  . Keflex [Cephalexin] Other (See Comments)    Reaction:  Unknown     Past Medical History, Surgical history, Social history, and Family History were reviewed and updated.  Review of Systems: All other 10 point review of systems is negative.   Physical Exam:  oral temperature is 97.8 F  (36.6 C). Her blood pressure is 126/61 and her pulse is 77. Her respiration is 18 and oxygen saturation is 98%.   Wt Readings from Last 3 Encounters:  01/22/17 187 lb 2.7 oz (84.9 kg)  11/23/16 197 lb 3.2 oz (89.4 kg)  11/02/16 197 lb 12.8 oz (89.7 kg)    Elderly white female. Hent exam shows no ocular or oral lesions. She has no adenopathy in the neck. She has no palpable thyroid. Lungs are clear bilaterally. Cardiac exam regular rate and rhythm with an occasional extra beat. She has no murmurs, rubs or bruits. Abdomen is soft. She has good bowel sounds. She is mildly obese. There is no fluid wave. There is no palpable liver or spleen tip. Back exam shows no tenderness over the spine, ribs or hips. Extremities shows no clubbing, cyanosis or edema. There may be some slight swelling in her legs. Skin exam shows no ecchymoses. She has no petechia. Neurological exam shows no focal neurological deficits.    Lab Results  Component Value Date   WBC 17.5 (H) 02/25/2017   HGB 15.8 02/25/2017   HCT 48.0 (H) 02/25/2017   MCV 87 02/25/2017   PLT 115 Platelet count consistent in citrate (L) 02/25/2017   No results found for: FERRITIN, IRON, TIBC, UIBC, IRONPCTSAT Lab Results  Component Value Date   RBC 5.52 (H) 02/25/2017   No results found for: KPAFRELGTCHN, LAMBDASER, KAPLAMBRATIO No results found for: IGGSERUM, IGA, IGMSERUM No results found for: Odetta Pink, SPEI   Chemistry      Component Value Date/Time   NA 138 03/15/2017 0806   NA 136 02/25/2017 1056   K 4.6 03/15/2017 0806   K 4.4 02/25/2017 1056   CL 99 03/15/2017 0806   CO2 28 03/15/2017 0806   CO2 25 02/25/2017 1056   BUN 39 (H) 03/15/2017 0806   BUN 49.1 (H) 02/25/2017 1056   CREATININE 1.4 (H) 03/15/2017 0806   CREATININE 1.6 (H) 02/25/2017 1056      Component Value Date/Time   CALCIUM 9.3 03/15/2017 0806   CALCIUM 9.3 02/25/2017 1056   ALKPHOS 67 03/15/2017 0806    ALKPHOS 91 02/25/2017 1056   AST 18 03/15/2017 0806   AST 19 02/25/2017 1056   ALT 22 03/15/2017 0806   ALT 23 02/25/2017 1056   BILITOT 1.30 03/15/2017 0806   BILITOT 0.83 02/25/2017 1056      Impression and Plan: [name] [name] is a very pleasant 83 yo caucasian female with ITP.   Heart this point, we will try to taper her down off the prednisone. Hopefully, the Tavalisse that she is on will continue to  keep her platelet count above 50,000. I think that as long as her platelet count is above 50,000, she will have very low risk of bleeding.  I don't see that we have to do Nplate on her.  I want to get her back in one month. By then, we will have tapered her prednisone dose down to 0.  I spent about 25 minutes with her today.   Volanda Napoleon, MD 6/29/20188:39 AM

## 2017-04-11 ENCOUNTER — Other Ambulatory Visit (HOSPITAL_BASED_OUTPATIENT_CLINIC_OR_DEPARTMENT_OTHER): Payer: Medicare Other

## 2017-04-11 ENCOUNTER — Ambulatory Visit (HOSPITAL_BASED_OUTPATIENT_CLINIC_OR_DEPARTMENT_OTHER): Payer: Medicare Other | Admitting: Family

## 2017-04-11 VITALS — BP 124/51 | HR 75 | Temp 99.1°F | Resp 18

## 2017-04-11 DIAGNOSIS — D693 Immune thrombocytopenic purpura: Secondary | ICD-10-CM | POA: Diagnosis present

## 2017-04-11 DIAGNOSIS — D696 Thrombocytopenia, unspecified: Secondary | ICD-10-CM

## 2017-04-11 DIAGNOSIS — I872 Venous insufficiency (chronic) (peripheral): Secondary | ICD-10-CM | POA: Diagnosis not present

## 2017-04-11 LAB — CMP (CANCER CENTER ONLY)
ALT(SGPT): 23 U/L (ref 10–47)
AST: 17 U/L (ref 11–38)
Albumin: 2.8 g/dL — ABNORMAL LOW (ref 3.3–5.5)
Alkaline Phosphatase: 68 U/L (ref 26–84)
BUN: 25 mg/dL — AB (ref 7–22)
CALCIUM: 9 mg/dL (ref 8.0–10.3)
CHLORIDE: 98 meq/L (ref 98–108)
CO2: 28 mEq/L (ref 18–33)
Creat: 1.4 mg/dl — ABNORMAL HIGH (ref 0.6–1.2)
GLUCOSE: 145 mg/dL — AB (ref 73–118)
POTASSIUM: 4.5 meq/L (ref 3.3–4.7)
Sodium: 136 mEq/L (ref 128–145)
TOTAL PROTEIN: 6.3 g/dL — AB (ref 6.4–8.1)
Total Bilirubin: 0.7 mg/dl (ref 0.20–1.60)

## 2017-04-11 LAB — MANUAL DIFFERENTIAL (CHCC SATELLITE)
ALC: 2.5 10*3/uL — AB (ref 0.6–2.2)
ANC (CHCC HP manual diff): 12.4 10*3/uL — ABNORMAL HIGH (ref 1.5–6.7)
BASO: 1 % (ref 0–2)
LYMPH: 16 % (ref 14–48)
MONO: 3 % (ref 0–13)
PLT EST ~~LOC~~: DECREASED
RBC COMMENTS: NORMAL
SEG: 80 % — AB (ref 40–75)

## 2017-04-11 LAB — CBC WITH DIFFERENTIAL (CANCER CENTER ONLY)
HEMATOCRIT: 46 % (ref 34.8–46.6)
HGB: 15.5 g/dL (ref 11.6–15.9)
MCH: 31.8 pg (ref 26.0–34.0)
MCHC: 33.7 g/dL (ref 32.0–36.0)
MCV: 94 fL (ref 81–101)
RBC: 4.88 10*6/uL (ref 3.70–5.32)
RDW: 16.6 % — AB (ref 11.1–15.7)
WBC: 15.5 10*3/uL — ABNORMAL HIGH (ref 3.9–10.0)

## 2017-04-11 LAB — CHCC SATELLITE - SMEAR

## 2017-04-11 NOTE — Progress Notes (Signed)
Hematology and Oncology Follow Up Visit  [name] [name] [id number] December 23, 1930 83 y.o. 04/11/2017   Principle Diagnosis:  Chronic ITP  Current Therapy:   Prednisone 60 mg by mouth daily - to be tapered off over 3 weeks   Interim History:  [name] [name] is here today for follow-up. Unfortunately, she was unable to get Tavalisse at her nursing home. The cost was going to be around $12,000.  She weaned off of Prednisone a few weeks ago and has done well. Her platelet count is stable at 62.  She denies any episodes of bleeding. She does bruise easily at times.  No fever, chills, n/v, cough, rash, dizziness, chest pain, palpitations, abdominal pain or changes in bowel or bladder habits.  She is doing well on supplemental O2 and only removes for meals while eating. She has chronic leg pain, swelling and hyperpigmentation due to venous insufficiency. No c/o numbness or tingling in her extremities at this time.  She has an abrasion on the heel of her right foot that her facility is keeping clean, dry and bandaged.  She states that she has maintained a good appetite and is staying well hydrated.   ECOG Performance Status: 1 - Symptomatic but completely ambulatory  Medications:  Allergies as of 04/11/2017      Reactions   Keflex [cephalexin] Other (See Comments)   Reaction:  Unknown       Medication List       Accurate as of 04/11/17  3:38 PM. Always use your most recent med list.          ACIDOPHILUS/CITRUS PECTIN Tabs Take 1 tablet by mouth every evening.   albuterol (2.5 MG/3ML) 0.083% nebulizer solution Commonly known as:  PROVENTIL Take 2.5 mg by nebulization every 6 (six) hours as needed for wheezing or shortness of breath.   CALCIUM 600+D 600-200 MG-UNIT Tabs Generic drug:  Calcium Carbonate-Vitamin D Take 2 tablets by mouth daily.   ciprofloxacin 250 MG tablet Commonly known as:  CIPRO Take 1 tablet (250 mg total) by mouth 2 (two) times daily.   digoxin 0.125 MG  tablet Commonly known as:  LANOXIN Take 0.0625 mg by mouth daily.   diltiazem 240 MG 24 hr capsule Commonly known as:  CARDIZEM CD Take 1 capsule (240 mg total) by mouth daily.   famciclovir 250 MG tablet Commonly known as:  FAMVIR Take 1 tablet (250 mg total) by mouth daily.   Fostamatinib Disodium 100 MG Tabs Commonly known as:  TAVALISSE Take 100 mg by mouth 2 (two) times daily.   insulin lispro 100 UNIT/ML injection Commonly known as:  HUMALOG Inject 0-15 Units into the skin 3 (three) times daily as needed for high blood sugar. Pt uses as needed per sliding scale:  70-120:  0 units, 121-150:  2 units, 151-200:  3 units, 201-250:  5 units, 251-300:  8 units, 301-350:  11 units, 351-400:  15 units, Greater than 400:  Call MD   metoprolol tartrate 100 MG tablet Commonly known as:  LOPRESSOR Take 1 tablet (100 mg total) by mouth 2 (two) times daily.   morphine 10 MG 24 hr capsule Commonly known as:  KADIAN   naloxone 0.4 MG/ML injection Commonly known as:  NARCAN   NOVOLOG 100 UNIT/ML injection Generic drug:  insulin aspart   OXYCONTIN 10 mg 12 hr tablet Generic drug:  oxyCODONE   pantoprazole 40 MG tablet Commonly known as:  PROTONIX Take 1 tablet (40 mg total) by mouth daily.  potassium chloride SA 20 MEQ tablet Commonly known as:  K-DUR,KLOR-CON Take 1 tablet (20 mEq total) by mouth 2 (two) times daily.   predniSONE 20 MG tablet Commonly known as:  DELTASONE Take 3 tablets (60 mg total) by mouth daily with breakfast.   sitaGLIPtin 50 MG tablet Commonly known as:  JANUVIA Take 50 mg by mouth daily.   sodium chloride 0.65 % Soln nasal spray Commonly known as:  OCEAN Place 1 spray into both nostrils 3 (three) times daily.   SYSTANE 0.4-0.3 % Soln Generic drug:  Polyethyl Glycol-Propyl Glycol Place 2 drops into both eyes 2 (two) times daily.   torsemide 10 MG tablet Commonly known as:  DEMADEX   traMADol 50 MG tablet Commonly known as:  ULTRAM        Allergies:  Allergies  Allergen Reactions  . Keflex [Cephalexin] Other (See Comments)    Reaction:  Unknown     Past Medical History, Surgical history, Social history, and Family History were reviewed and updated.  Review of Systems: All other 10 point review of systems is negative.   Physical Exam:  oral temperature is 99.1 F (37.3 C). Her blood pressure is 124/51 (abnormal) and her pulse is 75. Her respiration is 18 and oxygen saturation is 98%.   Wt Readings from Last 3 Encounters:  01/22/17 187 lb 2.7 oz (84.9 kg)  11/23/16 197 lb 3.2 oz (89.4 kg)  11/02/16 197 lb 12.8 oz (89.7 kg)    Ocular: Sclerae unicteric, pupils equal, round and reactive to light Ear-nose-throat: Oropharynx clear, dentition fair Lymphatic: No cervical, supraclavicular or axillary adenopathy Lungs no rales or rhonchi, good excursion bilaterally Heart regular rate and rhythm, no murmur appreciated Abd soft, nontender, positive bowel sounds, no liver or spleen tip palpated on exam, no fluid wave MSK no focal spinal tenderness, no joint edema Neuro: non-focal, well-oriented, appropriate affect Breasts: Deferred   Lab Results  Component Value Date   WBC 26.6 (H) 03/15/2017   HGB 16.8 (H) 03/15/2017   HCT 50.3 (H) 03/15/2017   MCV 91 03/15/2017   PLT 105 Platelet count consistent in citrate (L) 03/15/2017   No results found for: FERRITIN, IRON, TIBC, UIBC, IRONPCTSAT Lab Results  Component Value Date   RBC 5.53 (H) 03/15/2017   No results found for: KPAFRELGTCHN, LAMBDASER, KAPLAMBRATIO No results found for: IGGSERUM, IGA, IGMSERUM No results found for: Odetta Pink, SPEI   Chemistry      Component Value Date/Time   NA 138 03/15/2017 0806   NA 136 02/25/2017 1056   K 4.6 03/15/2017 0806   K 4.4 02/25/2017 1056   CL 99 03/15/2017 0806   CO2 28 03/15/2017 0806   CO2 25 02/25/2017 1056   BUN 39 (H) 03/15/2017 0806   BUN 49.1 (H)  02/25/2017 1056   CREATININE 1.4 (H) 03/15/2017 0806   CREATININE 1.6 (H) 02/25/2017 1056      Component Value Date/Time   CALCIUM 9.3 03/15/2017 0806   CALCIUM 9.3 02/25/2017 1056   ALKPHOS 67 03/15/2017 0806   ALKPHOS 91 02/25/2017 1056   AST 18 03/15/2017 0806   AST 19 02/25/2017 1056   ALT 22 03/15/2017 0806   ALT 23 02/25/2017 1056   BILITOT 1.30 03/15/2017 0806   BILITOT 0.83 02/25/2017 1056      Impression and Plan: [name] [name] is a very pleasant 83 yo caucasian female with ITP. She has now weaned off of Prednisone and her platelet count is  87. HSe denies having had any issue with bleeding. No excessive bruising.  I spoke with Dr. Marin Olp and we continue to follow along with her. No intervention needed at this time.  We will plan to see her back in another 3 weeks for repeat labs and follow-up.  She will contact our office with any questions or concerns. We can certainly see her sooner if need be.   Eliezer Bottom, NP 7/26/20183:38 PM

## 2017-05-03 ENCOUNTER — Ambulatory Visit (HOSPITAL_BASED_OUTPATIENT_CLINIC_OR_DEPARTMENT_OTHER): Payer: Medicare Other | Admitting: Family

## 2017-05-03 ENCOUNTER — Other Ambulatory Visit (HOSPITAL_BASED_OUTPATIENT_CLINIC_OR_DEPARTMENT_OTHER): Payer: Medicare Other

## 2017-05-03 VITALS — BP 105/57 | HR 77 | Temp 98.6°F | Resp 17

## 2017-05-03 DIAGNOSIS — D693 Immune thrombocytopenic purpura: Secondary | ICD-10-CM | POA: Diagnosis present

## 2017-05-03 DIAGNOSIS — D696 Thrombocytopenia, unspecified: Secondary | ICD-10-CM

## 2017-05-03 LAB — COMPREHENSIVE METABOLIC PANEL (CC13)
ALT: 11 IU/L (ref 0–32)
AST: 11 IU/L (ref 0–40)
Albumin, Serum: 3.5 g/dL (ref 3.5–4.7)
Albumin/Globulin Ratio: 1.3 (ref 1.2–2.2)
Alkaline Phosphatase, S: 83 IU/L (ref 39–117)
BUN/Creatinine Ratio: 22 (ref 12–28)
BUN: 28 mg/dL — AB (ref 8–27)
Bilirubin Total: 0.4 mg/dL (ref 0.0–1.2)
CALCIUM: 9.1 mg/dL (ref 8.7–10.3)
CO2: 22 mmol/L (ref 20–29)
CREATININE: 1.26 mg/dL — AB (ref 0.57–1.00)
Chloride, Ser: 101 mmol/L (ref 96–106)
GFR calc Af Amer: 45 mL/min/{1.73_m2} — ABNORMAL LOW (ref >59)
GFR, EST NON AFRICAN AMERICAN: 39 mL/min/{1.73_m2} — AB (ref >59)
GLUCOSE: 225 mg/dL — AB (ref 65–99)
Globulin, Total: 2.7 g/dL (ref 1.5–4.5)
Potassium, Ser: 4.4 mmol/L (ref 3.5–5.2)
Sodium: 134 mmol/L (ref 134–144)
Total Protein: 6.2 g/dL (ref 6.0–8.5)

## 2017-05-03 LAB — MANUAL DIFFERENTIAL (CHCC SATELLITE)
ALC: 2.5 10*3/uL — AB (ref 0.6–2.2)
ANC (CHCC MAN DIFF): 18.5 10*3/uL — AB (ref 1.5–6.7)
LYMPH: 11 % — ABNORMAL LOW (ref 14–48)
MONO: 7 % (ref 0–13)
PLT EST ~~LOC~~: DECREASED
RBC COMMENTS: NORMAL
SEG: 82 % — AB (ref 40–75)

## 2017-05-03 LAB — CBC WITH DIFFERENTIAL (CANCER CENTER ONLY)
HEMATOCRIT: 51.3 % — AB (ref 34.8–46.6)
HEMOGLOBIN: 16.9 g/dL — AB (ref 11.6–15.9)
MCH: 30.2 pg (ref 26.0–34.0)
MCHC: 32.9 g/dL (ref 32.0–36.0)
MCV: 92 fL (ref 81–101)
Platelets: 49 10*3/uL — ABNORMAL LOW (ref 145–400)
RBC: 5.59 10*6/uL — AB (ref 3.70–5.32)
RDW: 14.7 % (ref 11.1–15.7)
WBC: 22.5 10*3/uL — AB (ref 3.9–10.0)

## 2017-05-03 NOTE — Progress Notes (Signed)
Hematology and Oncology Follow Up Visit  SHONDA MANDARINO [id number] 06/14/1931 83 y.o. 05/03/2017   Principle Diagnosis:  Chronic ITP  Current Therapy:   Obsevation   Interim History:  Ms. Raney is here today with her care giver for follow-up. She is doing fairly well but still having a lot of back and leg pain due to arthritis.  She has had no new issues since we last saw her. She has been off of Prednisone for over a month. Her platelet count is 49. Hgb is 16.9 with an MCV of 92.  She denies having any episodes of bleeding. She does bruise easily at times but not in excess.  She has venous stasis dermatitis with swelling and hyperpigmentation of both lower extremities. Pedal pulses are +1. She is on daily Demadex.  No numbness or tingling in her extremities.  No fever, chills, n/v, cough, rash, dizziness, SOB, chest pain, palpitations, abdominal pain or changes in bowel or bladder habits. She has occasional constipation.  She is no longer using supplemental O2. O2 sat today is 97% on RA.  She has maintained a good appetite and is staying well hydrated. Her weight is stable.   ECOG Performance Status: 1 - Symptomatic but completely ambulatory  Medications:  Allergies as of 05/03/2017      Reactions   Keflex [cephalexin] Other (See Comments)   Reaction:  Unknown       Medication List       Accurate as of 05/03/17  4:20 PM. Always use your most recent med list.          ACIDOPHILUS/CITRUS PECTIN Tabs Take 1 tablet by mouth every evening.   albuterol (2.5 MG/3ML) 0.083% nebulizer solution Commonly known as:  PROVENTIL Take 2.5 mg by nebulization every 6 (six) hours as needed for wheezing or shortness of breath.   CALCIUM 600+D 600-200 MG-UNIT Tabs Generic drug:  Calcium Carbonate-Vitamin D Take 2 tablets by mouth daily.   ciprofloxacin 250 MG tablet Commonly known as:  CIPRO Take 1 tablet (250 mg total) by mouth 2 (two) times daily.   digoxin 0.125 MG  tablet Commonly known as:  LANOXIN Take 0.0625 mg by mouth daily.   diltiazem 240 MG 24 hr capsule Commonly known as:  CARDIZEM CD Take 1 capsule (240 mg total) by mouth daily.   famciclovir 250 MG tablet Commonly known as:  FAMVIR Take 1 tablet (250 mg total) by mouth daily.   Fostamatinib Disodium 100 MG Tabs Commonly known as:  TAVALISSE Take 100 mg by mouth 2 (two) times daily.   insulin lispro 100 UNIT/ML injection Commonly known as:  HUMALOG Inject 0-15 Units into the skin 3 (three) times daily as needed for high blood sugar. Pt uses as needed per sliding scale:  70-120:  0 units, 121-150:  2 units, 151-200:  3 units, 201-250:  5 units, 251-300:  8 units, 301-350:  11 units, 351-400:  15 units, Greater than 400:  Call MD   metoprolol tartrate 100 MG tablet Commonly known as:  LOPRESSOR Take 1 tablet (100 mg total) by mouth 2 (two) times daily.   morphine 10 MG 24 hr capsule Commonly known as:  KADIAN   naloxone 0.4 MG/ML injection Commonly known as:  NARCAN   NOVOLOG 100 UNIT/ML injection Generic drug:  insulin aspart   OXYCONTIN 10 mg 12 hr tablet Generic drug:  oxyCODONE   pantoprazole 40 MG tablet Commonly known as:  PROTONIX Take 1 tablet (40 mg total) by mouth daily.  potassium chloride SA 20 MEQ tablet Commonly known as:  K-DUR,KLOR-CON Take 1 tablet (20 mEq total) by mouth 2 (two) times daily.   predniSONE 20 MG tablet Commonly known as:  DELTASONE Take 3 tablets (60 mg total) by mouth daily with breakfast.   sitaGLIPtin 50 MG tablet Commonly known as:  JANUVIA Take 50 mg by mouth daily.   sodium chloride 0.65 % Soln nasal spray Commonly known as:  OCEAN Place 1 spray into both nostrils 3 (three) times daily.   SYSTANE 0.4-0.3 % Soln Generic drug:  Polyethyl Glycol-Propyl Glycol Place 2 drops into both eyes 2 (two) times daily.   torsemide 10 MG tablet Commonly known as:  DEMADEX   traMADol 50 MG tablet Commonly known as:  ULTRAM        Allergies:  Allergies  Allergen Reactions  . Keflex [Cephalexin] Other (See Comments)    Reaction:  Unknown     Past Medical History, Surgical history, Social history, and Family History were reviewed and updated.  Review of Systems: All other 10 point review of systems is negative.   Physical Exam:  oral temperature is 98.6 F (37 C). Her blood pressure is 105/57 (abnormal) and her pulse is 77. Her respiration is 17 and oxygen saturation is 97%.   Wt Readings from Last 3 Encounters:  01/22/17 187 lb 2.7 oz (84.9 kg)  11/23/16 197 lb 3.2 oz (89.4 kg)  11/02/16 197 lb 12.8 oz (89.7 kg)    Ocular: Sclerae unicteric, pupils equal, round and reactive to light Ear-nose-throat: Oropharynx clear, dentition fair Lymphatic: No cervical, supraclavicular or axillary adenopathy Lungs no rales or rhonchi, good excursion bilaterally Heart regular rate and rhythm, no murmur appreciated Abd soft, nontender, positive bowel sounds, no liver or spleen tip palpated on exam, no fluid wave  MSK no focal spinal tenderness, no joint edema Neuro: non-focal, well-oriented, appropriate affect Breasts: Deferred   Lab Results  Component Value Date   WBC 22.5 (H) 05/03/2017   HGB 16.9 (H) 05/03/2017   HCT 51.3 (H) 05/03/2017   MCV 92 05/03/2017   PLT 49 Platelet count consistent in citrate (L) 05/03/2017   No results found for: FERRITIN, IRON, TIBC, UIBC, IRONPCTSAT Lab Results  Component Value Date   RBC 5.59 (H) 05/03/2017   No results found for: KPAFRELGTCHN, LAMBDASER, KAPLAMBRATIO No results found for: IGGSERUM, IGA, IGMSERUM No results found for: Odetta Pink, SPEI   Chemistry      Component Value Date/Time   NA 136 04/11/2017 1511   NA 136 02/25/2017 1056   K 4.5 04/11/2017 1511   K 4.4 02/25/2017 1056   CL 98 04/11/2017 1511   CO2 28 04/11/2017 1511   CO2 25 02/25/2017 1056   BUN 25 (H) 04/11/2017 1511   BUN 49.1 (H)  02/25/2017 1056   CREATININE 1.4 (H) 04/11/2017 1511   CREATININE 1.6 (H) 02/25/2017 1056      Component Value Date/Time   CALCIUM 9.0 04/11/2017 1511   CALCIUM 9.3 02/25/2017 1056   ALKPHOS 68 04/11/2017 1511   ALKPHOS 91 02/25/2017 1056   AST 17 04/11/2017 1511   AST 19 02/25/2017 1056   ALT 23 04/11/2017 1511   ALT 23 02/25/2017 1056   BILITOT 0.70 04/11/2017 1511   BILITOT 0.83 02/25/2017 1056      Impression and Plan: Ms. Radloff is a very pleasant 83 yo caucasian female with ITP. She has been off of treatment for over a month and  platelet count is 49. Hgb is stable at 16.9 with an MCV of 92. Her WBC count is still elevated at 22.5. I spoke with Dr. Marin Olp and since she is asymptomatic we will continue to follow along with her. If her count continues to drop we will consider restarting the IVIG.  All questions were answered and she is in agreement with the plan.  We will plan to see her back again in another 3 weeks for repeat lab work and follow-up.  Her living facility is good to contact our office with any questions or concerns. We can certainly see her sooner if need be.   Eliezer Bottom, NP 8/17/20184:20 PM

## 2017-05-24 ENCOUNTER — Other Ambulatory Visit: Payer: Medicare Other

## 2017-05-24 ENCOUNTER — Ambulatory Visit: Payer: Medicare Other | Admitting: Family

## 2017-05-24 ENCOUNTER — Ambulatory Visit: Payer: Medicare Other | Admitting: Hematology & Oncology

## 2017-06-12 ENCOUNTER — Other Ambulatory Visit (HOSPITAL_BASED_OUTPATIENT_CLINIC_OR_DEPARTMENT_OTHER): Payer: Medicare Other

## 2017-06-12 ENCOUNTER — Ambulatory Visit (HOSPITAL_BASED_OUTPATIENT_CLINIC_OR_DEPARTMENT_OTHER): Payer: Medicare Other | Admitting: Hematology & Oncology

## 2017-06-12 VITALS — BP 117/64 | HR 71 | Temp 98.0°F | Resp 18

## 2017-06-12 DIAGNOSIS — D696 Thrombocytopenia, unspecified: Secondary | ICD-10-CM

## 2017-06-12 DIAGNOSIS — D693 Immune thrombocytopenic purpura: Secondary | ICD-10-CM

## 2017-06-12 LAB — CBC WITH DIFFERENTIAL (CANCER CENTER ONLY)
BASO#: 0.1 10*3/uL (ref 0.0–0.2)
BASO%: 0.5 % (ref 0.0–2.0)
EOS%: 2.9 % (ref 0.0–7.0)
Eosinophils Absolute: 0.3 10*3/uL (ref 0.0–0.5)
HEMATOCRIT: 43.9 % (ref 34.8–46.6)
HGB: 13.2 g/dL (ref 11.6–15.9)
LYMPH#: 1.5 10*3/uL (ref 0.9–3.3)
LYMPH%: 13.9 % — ABNORMAL LOW (ref 14.0–48.0)
MCH: 26.9 pg (ref 26.0–34.0)
MCHC: 30.1 g/dL — AB (ref 32.0–36.0)
MCV: 90 fL (ref 81–101)
MONO#: 0.6 10*3/uL (ref 0.1–0.9)
MONO%: 5.6 % (ref 0.0–13.0)
NEUT#: 8.4 10*3/uL — ABNORMAL HIGH (ref 1.5–6.5)
NEUT%: 77.1 % (ref 39.6–80.0)
Platelets: 40 10*3/uL — ABNORMAL LOW (ref 145–400)
RBC: 4.9 10*6/uL (ref 3.70–5.32)
RDW: 16 % — AB (ref 11.1–15.7)
WBC: 10.8 10*3/uL — ABNORMAL HIGH (ref 3.9–10.0)

## 2017-06-12 LAB — CMP (CANCER CENTER ONLY)
ALBUMIN: 2.6 g/dL — AB (ref 3.3–5.5)
ALT(SGPT): 11 U/L (ref 10–47)
AST: 18 U/L (ref 11–38)
Alkaline Phosphatase: 85 U/L — ABNORMAL HIGH (ref 26–84)
BUN, Bld: 16 mg/dL (ref 7–22)
CALCIUM: 9.2 mg/dL (ref 8.0–10.3)
CHLORIDE: 103 meq/L (ref 98–108)
CO2: 33 meq/L (ref 18–33)
Creat: 1.2 mg/dl (ref 0.6–1.2)
Glucose, Bld: 104 mg/dL (ref 73–118)
POTASSIUM: 4.1 meq/L (ref 3.3–4.7)
SODIUM: 139 meq/L (ref 128–145)
Total Bilirubin: 0.8 mg/dl (ref 0.20–1.60)
Total Protein: 6.4 g/dL (ref 6.4–8.1)

## 2017-06-12 LAB — CHCC SATELLITE - SMEAR

## 2017-06-12 NOTE — Progress Notes (Signed)
Hematology and Oncology Follow Up Visit  [name] [name] [id number] 25-Apr-1931 83 y.o. 06/12/2017   Principle Diagnosis:  Chronic ITP  Current Therapy:   Rituxan every week 4 weeks to start on October 10   Interim History:  [name] [name] is here today for follow-up. Her platelet count continues to drop. As such, I think we're going to have to intervene. We have done IVIG. We have done prednisone. She is not a good candidate for long-term prednisone. I'm not sure she can handle the volume load with IVIG.  We will go ahead and try her on right toxin. I think this would be reasonable.  She is at a nursing home. She does not do all that much. She is eating. She is having no bleeding. She does have some bruising.  There is no nausea or vomiting. She's had no headache. She's had no change in bowel or bladder habits.  There is been no change in her medications.  Overall, her performance status is ECOG 2.  Medications:  Allergies as of 06/12/2017      Reactions   Keflex [cephalexin] Other (See Comments)   Reaction:  Unknown       Medication List       Accurate as of 06/12/17  3:06 PM. Always use your most recent med list.          ACIDOPHILUS/CITRUS PECTIN Tabs Take 1 tablet by mouth every evening.   albuterol (2.5 MG/3ML) 0.083% nebulizer solution Commonly known as:  PROVENTIL Take 2.5 mg by nebulization every 6 (six) hours as needed for wheezing or shortness of breath.   CALCIUM 600+D 600-200 MG-UNIT Tabs Generic drug:  Calcium Carbonate-Vitamin D Take 2 tablets by mouth daily.   digoxin 0.125 MG tablet Commonly known as:  LANOXIN Take 0.0625 mg by mouth daily.   diltiazem 240 MG 24 hr capsule Commonly known as:  CARDIZEM CD Take 1 capsule (240 mg total) by mouth daily.   famciclovir 250 MG tablet Commonly known as:  FAMVIR Take 1 tablet (250 mg total) by mouth daily.   Fostamatinib Disodium 100 MG Tabs Commonly known as:  TAVALISSE Take 100 mg by mouth 2  (two) times daily.   gabapentin 300 MG capsule Commonly known as:  NEURONTIN   glipiZIDE 2.5 MG 24 hr tablet Commonly known as:  GLUCOTROL XL   insulin lispro 100 UNIT/ML injection Commonly known as:  HUMALOG Inject 0-15 Units into the skin 3 (three) times daily as needed for high blood sugar. Pt uses as needed per sliding scale:  70-120:  0 units, 121-150:  2 units, 151-200:  3 units, 201-250:  5 units, 251-300:  8 units, 301-350:  11 units, 351-400:  15 units, Greater than 400:  Call MD   LEVEMIR 100 UNIT/ML injection Generic drug:  insulin detemir   metoprolol tartrate 100 MG tablet Commonly known as:  LOPRESSOR Take 1 tablet (100 mg total) by mouth 2 (two) times daily.   morphine 10 MG 24 hr capsule Commonly known as:  KADIAN   naloxone 0.4 MG/ML injection Commonly known as:  NARCAN   NOVOLOG 100 UNIT/ML injection Generic drug:  insulin aspart   OXYCONTIN 10 mg 12 hr tablet Generic drug:  oxyCODONE   pantoprazole 40 MG tablet Commonly known as:  PROTONIX Take 1 tablet (40 mg total) by mouth daily.   potassium chloride SA 20 MEQ tablet Commonly known as:  K-DUR,KLOR-CON Take 1 tablet (20 mEq total) by mouth 2 (two) times daily.  predniSONE 5 MG tablet Commonly known as:  DELTASONE   sitaGLIPtin 50 MG tablet Commonly known as:  JANUVIA Take 50 mg by mouth daily.   sodium chloride 0.65 % Soln nasal spray Commonly known as:  OCEAN Place 1 spray into both nostrils 3 (three) times daily.   SYSTANE 0.4-0.3 % Soln Generic drug:  Polyethyl Glycol-Propyl Glycol Place 2 drops into both eyes 2 (two) times daily.   torsemide 20 MG tablet Commonly known as:  DEMADEX   traMADol 50 MG tablet Commonly known as:  ULTRAM       Allergies:  Allergies  Allergen Reactions  . Keflex [Cephalexin] Other (See Comments)    Reaction:  Unknown     Past Medical History, Surgical history, Social history, and Family History were reviewed and updated.  Review of  Systems: All other 10 point review of systems is negative.   Physical Exam:  oral temperature is 98 F (36.7 C). Her blood pressure is 117/64 and her pulse is 71. Her respiration is 18 and oxygen saturation is 90%.   Wt Readings from Last 3 Encounters:  01/22/17 187 lb 2.7 oz (84.9 kg)  11/23/16 197 lb 3.2 oz (89.4 kg)  11/02/16 197 lb 12.8 oz (89.7 kg)    Obese white female in a wheelchair. Head and neck exam shows no ocular or oral lesions. There are no palpable cervical or supraclavicular lymph nodes. She has no scleral icterus. Lungs are clear. There may be some slight decrease at the bases. Cardiac exam regular rate and rhythm with no murmurs, rubs or bruits. Abdomen is obese but soft. She has no fluid wave. There is no guarding or rebound tenderness. There is no palpable liver or spleen tip. Extremities shows chronic 2+ edema in her legs. Skin exam shows some ecchymoses that are scattered. Neurological exam is nonfocal.  Lab Results  Component Value Date   WBC 10.8 (H) 06/12/2017   HGB 13.2 06/12/2017   HCT 43.9 06/12/2017   MCV 90 06/12/2017   PLT 40 Platelet count consistent in citrate (L) 06/12/2017   No results found for: FERRITIN, IRON, TIBC, UIBC, IRONPCTSAT Lab Results  Component Value Date   RBC 4.90 06/12/2017   No results found for: KPAFRELGTCHN, LAMBDASER, KAPLAMBRATIO No results found for: IGGSERUM, IGA, IGMSERUM No results found for: Odetta Pink, SPEI   Chemistry      Component Value Date/Time   NA 139 06/12/2017 1400   NA 136 02/25/2017 1056   K 4.1 06/12/2017 1400   K 4.4 02/25/2017 1056   CL 103 06/12/2017 1400   CO2 33 06/12/2017 1400   CO2 25 02/25/2017 1056   BUN 16 06/12/2017 1400   BUN 49.1 (H) 02/25/2017 1056   CREATININE 1.2 06/12/2017 1400   CREATININE 1.6 (H) 02/25/2017 1056      Component Value Date/Time   CALCIUM 9.2 06/12/2017 1400   CALCIUM 9.3 02/25/2017 1056   ALKPHOS 85 (H)  06/12/2017 1400   ALKPHOS 91 02/25/2017 1056   AST 18 06/12/2017 1400   AST 19 02/25/2017 1056   ALT 11 06/12/2017 1400   ALT 23 02/25/2017 1056   BILITOT 0.80 06/12/2017 1400   BILITOT 0.83 02/25/2017 1056      Impression and Plan: [name] [name] is a very pleasant 83 yo caucasian female with ITP.  She has been treated with prednisone. She has been treated with IVIG. I really think we have to try something a little bit more  long lasting. As such, I am going to try her with Rituxan I talked to her at length about Rituxan. I explained to her what Rituxan is and how it works for immune thrombocytopenia. I told her that this is given weekly for 4 weeks.  She will need a PICC line. She has very poor IV access peripherally. I think a PICC line will be much more convenient.  She agrees to the right occiput. I explained to her the complications and the fact that after the first treatment, which will take 8 hours, there should be no problems.  I really believe that this will work.  We will start on October 10.  We will see her back on October 10 and weekly after that.  I spent about 45 minutes with her today. This is a complicated situation and I wanted to talk with her thoroughly about this.  Volanda Napoleon, MD 9/26/20183:06 PM

## 2017-06-25 ENCOUNTER — Ambulatory Visit (HOSPITAL_COMMUNITY)
Admission: RE | Admit: 2017-06-25 | Discharge: 2017-06-25 | Disposition: A | Discharge status: Home / Self Care | Payer: Medicare Other | Source: Ambulatory Visit | Attending: Hematology & Oncology | Admitting: Hematology & Oncology

## 2017-06-25 ENCOUNTER — Encounter (HOSPITAL_COMMUNITY): Payer: Self-pay | Admitting: Interventional Radiology

## 2017-06-25 ENCOUNTER — Other Ambulatory Visit: Payer: Self-pay | Admitting: Hematology & Oncology

## 2017-06-25 DIAGNOSIS — I878 Other specified disorders of veins: Secondary | ICD-10-CM | POA: Insufficient documentation

## 2017-06-25 DIAGNOSIS — D696 Thrombocytopenia, unspecified: Secondary | ICD-10-CM

## 2017-06-25 HISTORY — PX: IR FLUORO GUIDE CV LINE RIGHT: IMG2283

## 2017-06-25 HISTORY — PX: IR US GUIDE VASC ACCESS RIGHT: IMG2390

## 2017-06-25 MED ORDER — LIDOCAINE-EPINEPHRINE (PF) 2 %-1:200000 IJ SOLN
INTRAMUSCULAR | Status: DC | PRN
  Administered 2017-06-25: 5 mL via INTRADERMAL

## 2017-06-25 MED ORDER — HEPARIN SOD (PORK) LOCK FLUSH 100 UNIT/ML IV SOLN
INTRAVENOUS | Status: AC
  Filled 2017-06-25: qty 5

## 2017-06-25 MED ORDER — LIDOCAINE HCL 1 % IJ SOLN
INTRAMUSCULAR | Status: AC
Start: 2017-06-25 — End: 2017-06-25
  Filled 2017-06-25: qty 20

## 2017-06-26 ENCOUNTER — Ambulatory Visit: Payer: Medicare Other

## 2017-06-26 ENCOUNTER — Emergency Department (HOSPITAL_BASED_OUTPATIENT_CLINIC_OR_DEPARTMENT_OTHER): Payer: Medicare Other

## 2017-06-26 ENCOUNTER — Ambulatory Visit (HOSPITAL_BASED_OUTPATIENT_CLINIC_OR_DEPARTMENT_OTHER): Payer: Medicare Other | Admitting: Family

## 2017-06-26 ENCOUNTER — Inpatient Hospital Stay (HOSPITAL_BASED_OUTPATIENT_CLINIC_OR_DEPARTMENT_OTHER)
Admission: EM | Admit: 2017-06-26 | Discharge: 2017-07-04 | DRG: 291 | Disposition: A | Discharge status: Skilled Nursing Facility | Payer: Medicare Other | Attending: Family Medicine | Admitting: Family Medicine

## 2017-06-26 ENCOUNTER — Encounter (HOSPITAL_BASED_OUTPATIENT_CLINIC_OR_DEPARTMENT_OTHER): Payer: Self-pay | Admitting: Emergency Medicine

## 2017-06-26 ENCOUNTER — Other Ambulatory Visit: Payer: Self-pay

## 2017-06-26 ENCOUNTER — Telehealth: Payer: Self-pay

## 2017-06-26 ENCOUNTER — Other Ambulatory Visit (HOSPITAL_BASED_OUTPATIENT_CLINIC_OR_DEPARTMENT_OTHER): Payer: Medicare Other

## 2017-06-26 VITALS — BP 99/44 | HR 66 | Temp 97.8°F | Resp 19

## 2017-06-26 DIAGNOSIS — I5020 Unspecified systolic (congestive) heart failure: Secondary | ICD-10-CM | POA: Diagnosis not present

## 2017-06-26 DIAGNOSIS — M549 Dorsalgia, unspecified: Secondary | ICD-10-CM | POA: Diagnosis present

## 2017-06-26 DIAGNOSIS — J9611 Chronic respiratory failure with hypoxia: Secondary | ICD-10-CM | POA: Diagnosis present

## 2017-06-26 DIAGNOSIS — G9340 Encephalopathy, unspecified: Secondary | ICD-10-CM | POA: Diagnosis present

## 2017-06-26 DIAGNOSIS — I82621 Acute embolism and thrombosis of deep veins of right upper extremity: Secondary | ICD-10-CM | POA: Diagnosis present

## 2017-06-26 DIAGNOSIS — G9341 Metabolic encephalopathy: Secondary | ICD-10-CM | POA: Diagnosis present

## 2017-06-26 DIAGNOSIS — E876 Hypokalemia: Secondary | ICD-10-CM | POA: Diagnosis not present

## 2017-06-26 DIAGNOSIS — Z794 Long term (current) use of insulin: Secondary | ICD-10-CM

## 2017-06-26 DIAGNOSIS — D693 Immune thrombocytopenic purpura: Secondary | ICD-10-CM

## 2017-06-26 DIAGNOSIS — D696 Thrombocytopenia, unspecified: Secondary | ICD-10-CM | POA: Diagnosis present

## 2017-06-26 DIAGNOSIS — I5033 Acute on chronic diastolic (congestive) heart failure: Secondary | ICD-10-CM | POA: Diagnosis present

## 2017-06-26 DIAGNOSIS — N183 Chronic kidney disease, stage 3 unspecified: Secondary | ICD-10-CM | POA: Diagnosis present

## 2017-06-26 DIAGNOSIS — I252 Old myocardial infarction: Secondary | ICD-10-CM | POA: Diagnosis not present

## 2017-06-26 DIAGNOSIS — I25810 Atherosclerosis of coronary artery bypass graft(s) without angina pectoris: Secondary | ICD-10-CM | POA: Diagnosis present

## 2017-06-26 DIAGNOSIS — R531 Weakness: Secondary | ICD-10-CM | POA: Diagnosis not present

## 2017-06-26 DIAGNOSIS — E1122 Type 2 diabetes mellitus with diabetic chronic kidney disease: Secondary | ICD-10-CM | POA: Diagnosis present

## 2017-06-26 DIAGNOSIS — I130 Hypertensive heart and chronic kidney disease with heart failure and stage 1 through stage 4 chronic kidney disease, or unspecified chronic kidney disease: Secondary | ICD-10-CM | POA: Diagnosis present

## 2017-06-26 DIAGNOSIS — I5043 Acute on chronic combined systolic (congestive) and diastolic (congestive) heart failure: Secondary | ICD-10-CM | POA: Diagnosis present

## 2017-06-26 DIAGNOSIS — R233 Spontaneous ecchymoses: Secondary | ICD-10-CM | POA: Diagnosis not present

## 2017-06-26 DIAGNOSIS — I480 Paroxysmal atrial fibrillation: Secondary | ICD-10-CM | POA: Diagnosis present

## 2017-06-26 DIAGNOSIS — Z515 Encounter for palliative care: Secondary | ICD-10-CM | POA: Diagnosis not present

## 2017-06-26 DIAGNOSIS — F05 Delirium due to known physiological condition: Secondary | ICD-10-CM | POA: Diagnosis not present

## 2017-06-26 DIAGNOSIS — G8929 Other chronic pain: Secondary | ICD-10-CM | POA: Diagnosis present

## 2017-06-26 DIAGNOSIS — I10 Essential (primary) hypertension: Secondary | ICD-10-CM | POA: Diagnosis not present

## 2017-06-26 DIAGNOSIS — Z66 Do not resuscitate: Secondary | ICD-10-CM | POA: Diagnosis present

## 2017-06-26 DIAGNOSIS — T380X5A Adverse effect of glucocorticoids and synthetic analogues, initial encounter: Secondary | ICD-10-CM | POA: Diagnosis not present

## 2017-06-26 DIAGNOSIS — Z79899 Other long term (current) drug therapy: Secondary | ICD-10-CM

## 2017-06-26 DIAGNOSIS — F1120 Opioid dependence, uncomplicated: Secondary | ICD-10-CM | POA: Diagnosis present

## 2017-06-26 DIAGNOSIS — D6959 Other secondary thrombocytopenia: Secondary | ICD-10-CM | POA: Diagnosis present

## 2017-06-26 DIAGNOSIS — N390 Urinary tract infection, site not specified: Secondary | ICD-10-CM | POA: Diagnosis present

## 2017-06-26 DIAGNOSIS — R627 Adult failure to thrive: Secondary | ICD-10-CM | POA: Diagnosis present

## 2017-06-26 DIAGNOSIS — I2510 Atherosclerotic heart disease of native coronary artery without angina pectoris: Secondary | ICD-10-CM | POA: Diagnosis present

## 2017-06-26 DIAGNOSIS — M7989 Other specified soft tissue disorders: Secondary | ICD-10-CM

## 2017-06-26 DIAGNOSIS — R739 Hyperglycemia, unspecified: Secondary | ICD-10-CM

## 2017-06-26 DIAGNOSIS — I509 Heart failure, unspecified: Secondary | ICD-10-CM | POA: Diagnosis not present

## 2017-06-26 DIAGNOSIS — E785 Hyperlipidemia, unspecified: Secondary | ICD-10-CM | POA: Diagnosis present

## 2017-06-26 DIAGNOSIS — Z955 Presence of coronary angioplasty implant and graft: Secondary | ICD-10-CM | POA: Diagnosis not present

## 2017-06-26 DIAGNOSIS — J9621 Acute and chronic respiratory failure with hypoxia: Secondary | ICD-10-CM | POA: Diagnosis not present

## 2017-06-26 DIAGNOSIS — T40601A Poisoning by unspecified narcotics, accidental (unintentional), initial encounter: Secondary | ICD-10-CM

## 2017-06-26 DIAGNOSIS — E1165 Type 2 diabetes mellitus with hyperglycemia: Secondary | ICD-10-CM | POA: Diagnosis present

## 2017-06-26 DIAGNOSIS — R4182 Altered mental status, unspecified: Secondary | ICD-10-CM | POA: Diagnosis present

## 2017-06-26 DIAGNOSIS — R32 Unspecified urinary incontinence: Secondary | ICD-10-CM | POA: Diagnosis present

## 2017-06-26 DIAGNOSIS — Z993 Dependence on wheelchair: Secondary | ICD-10-CM

## 2017-06-26 DIAGNOSIS — T502X5A Adverse effect of carbonic-anhydrase inhibitors, benzothiadiazides and other diuretics, initial encounter: Secondary | ICD-10-CM | POA: Diagnosis not present

## 2017-06-26 DIAGNOSIS — Z881 Allergy status to other antibiotic agents status: Secondary | ICD-10-CM

## 2017-06-26 LAB — URINALYSIS, ROUTINE W REFLEX MICROSCOPIC
Bilirubin Urine: NEGATIVE
GLUCOSE, UA: NEGATIVE mg/dL
Ketones, ur: NEGATIVE mg/dL
NITRITE: POSITIVE — AB
PH: 6 (ref 5.0–8.0)
Protein, ur: NEGATIVE mg/dL
SPECIFIC GRAVITY, URINE: 1.01 (ref 1.005–1.030)

## 2017-06-26 LAB — CBC WITH DIFFERENTIAL (CANCER CENTER ONLY)
BASO#: 0 10*3/uL (ref 0.0–0.2)
BASO%: 0.3 % (ref 0.0–2.0)
EOS ABS: 0.2 10*3/uL (ref 0.0–0.5)
EOS%: 2.5 % (ref 0.0–7.0)
HCT: 40.2 % (ref 34.8–46.6)
HEMOGLOBIN: 12.2 g/dL (ref 11.6–15.9)
LYMPH#: 1.7 10*3/uL (ref 0.9–3.3)
LYMPH%: 17.9 % (ref 14.0–48.0)
MCH: 26.5 pg (ref 26.0–34.0)
MCHC: 30.3 g/dL — AB (ref 32.0–36.0)
MCV: 87 fL (ref 81–101)
MONO#: 0.5 10*3/uL (ref 0.1–0.9)
MONO%: 5.2 % (ref 0.0–13.0)
NEUT%: 74.1 % (ref 39.6–80.0)
NEUTROS ABS: 7 10*3/uL — AB (ref 1.5–6.5)
Platelets: 21 10*3/uL — ABNORMAL LOW (ref 145–400)
RBC: 4.61 10*6/uL (ref 3.70–5.32)
RDW: 16.6 % — AB (ref 11.1–15.7)
WBC: 9.4 10*3/uL (ref 3.9–10.0)

## 2017-06-26 LAB — RAPID URINE DRUG SCREEN, HOSP PERFORMED
AMPHETAMINES: NOT DETECTED
BENZODIAZEPINES: NOT DETECTED
Barbiturates: NOT DETECTED
COCAINE: NOT DETECTED
Opiates: NOT DETECTED
Tetrahydrocannabinol: NOT DETECTED

## 2017-06-26 LAB — I-STAT ARTERIAL BLOOD GAS, ED
Acid-Base Excess: 6 mmol/L — ABNORMAL HIGH (ref 0.0–2.0)
Bicarbonate: 29.8 mmol/L — ABNORMAL HIGH (ref 20.0–28.0)
O2 Saturation: 91 %
Patient temperature: 98.3
TCO2: 31 mmol/L (ref 22–32)
pCO2 arterial: 40.4 mmHg (ref 32.0–48.0)
pH, Arterial: 7.475 — ABNORMAL HIGH (ref 7.350–7.450)
pO2, Arterial: 57 mmHg — ABNORMAL LOW (ref 83.0–108.0)

## 2017-06-26 LAB — CMP (CANCER CENTER ONLY)
ALBUMIN: 2.4 g/dL — AB (ref 3.3–5.5)
ALT(SGPT): 13 U/L (ref 10–47)
AST: 14 U/L (ref 11–38)
Alkaline Phosphatase: 99 U/L — ABNORMAL HIGH (ref 26–84)
BUN, Bld: 29 mg/dL — ABNORMAL HIGH (ref 7–22)
CALCIUM: 9.2 mg/dL (ref 8.0–10.3)
CHLORIDE: 104 meq/L (ref 98–108)
CO2: 30 meq/L (ref 18–33)
CREATININE: 1.4 mg/dL — AB (ref 0.6–1.2)
GLUCOSE: 159 mg/dL — AB (ref 73–118)
Potassium: 3.7 mEq/L (ref 3.3–4.7)
SODIUM: 143 meq/L (ref 128–145)
Total Bilirubin: 0.7 mg/dl (ref 0.20–1.60)
Total Protein: 6.1 g/dL — ABNORMAL LOW (ref 6.4–8.1)

## 2017-06-26 LAB — D-DIMER, QUANTITATIVE: D-Dimer, Quant: 2.14 ug/mL-FEU — ABNORMAL HIGH (ref 0.00–0.50)

## 2017-06-26 LAB — TROPONIN I

## 2017-06-26 LAB — GLUCOSE, CAPILLARY: GLUCOSE-CAPILLARY: 129 mg/dL — AB (ref 65–99)

## 2017-06-26 LAB — URINALYSIS, MICROSCOPIC (REFLEX)

## 2017-06-26 LAB — BRAIN NATRIURETIC PEPTIDE: B NATRIURETIC PEPTIDE 5: 704.4 pg/mL — AB (ref 0.0–100.0)

## 2017-06-26 LAB — TECHNOLOGIST REVIEW CHCC SATELLITE: Tech Review: 1

## 2017-06-26 LAB — ETHANOL: Alcohol, Ethyl (B): <10 mg/dL (ref <10)

## 2017-06-26 MED ORDER — ACIDOPHILUS/CITRUS PECTIN PO TABS: 1.0000 | ORAL_TABLET | Freq: Every evening | ORAL | Status: DC

## 2017-06-26 MED ORDER — DIGOXIN 0.0625 MG HALF TABLET
0.0625 mg | ORAL_TABLET | Freq: Every day | ORAL | Status: DC
  Administered 2017-06-27 – 2017-07-04 (×8): 0.0625 mg via ORAL
  Filled 2017-06-26: qty 1
  Filled 2017-06-26: qty 0.5
  Filled 2017-06-26 (×6): qty 1

## 2017-06-26 MED ORDER — TORSEMIDE 20 MG PO TABS
20.0000 mg | ORAL_TABLET | Freq: Every day | ORAL | Status: DC
  Administered 2017-06-27 – 2017-07-04 (×8): 20 mg via ORAL
  Filled 2017-06-26 (×8): qty 1

## 2017-06-26 MED ORDER — CEFEPIME HCL 2 G IJ SOLR
INTRAMUSCULAR | Status: AC
  Filled 2017-06-26: qty 2

## 2017-06-26 MED ORDER — ACETAMINOPHEN 650 MG RE SUPP: 650.0000 mg | Freq: Four times a day (QID) | RECTAL | Status: DC | PRN

## 2017-06-26 MED ORDER — HEPARIN SOD (PORK) LOCK FLUSH 100 UNIT/ML IV SOLN
250.0000 [IU] | Freq: Once | INTRAVENOUS | Status: DC | PRN
  Filled 2017-06-26: qty 5

## 2017-06-26 MED ORDER — TRAMADOL HCL 50 MG PO TABS
50.0000 mg | ORAL_TABLET | Freq: Four times a day (QID) | ORAL | Status: DC | PRN
  Administered 2017-06-27 – 2017-07-04 (×4): 50 mg via ORAL
  Filled 2017-06-26 (×4): qty 1

## 2017-06-26 MED ORDER — GABAPENTIN 300 MG PO CAPS
300.0000 mg | ORAL_CAPSULE | Freq: Two times a day (BID) | ORAL | Status: DC
  Administered 2017-06-26 – 2017-07-04 (×16): 300 mg via ORAL
  Filled 2017-06-26 (×16): qty 1

## 2017-06-26 MED ORDER — ONDANSETRON HCL 4 MG PO TABS: 4.0000 mg | ORAL_TABLET | Freq: Four times a day (QID) | ORAL | Status: DC | PRN

## 2017-06-26 MED ORDER — POLYVINYL ALCOHOL 1.4 % OP SOLN
2.0000 [drp] | Freq: Two times a day (BID) | OPHTHALMIC | Status: DC
  Administered 2017-06-26 – 2017-07-04 (×16): 2 [drp] via OPHTHALMIC
  Filled 2017-06-26: qty 15

## 2017-06-26 MED ORDER — IOPAMIDOL (ISOVUE-370) INJECTION 76%
100.0000 mL | Freq: Once | INTRAVENOUS | Status: AC | PRN
  Administered 2017-06-26: 100 mL via INTRAVENOUS

## 2017-06-26 MED ORDER — SALINE SPRAY 0.65 % NA SOLN
1.0000 | Freq: Three times a day (TID) | NASAL | Status: DC
  Administered 2017-06-26 – 2017-07-04 (×23): 1 via NASAL
  Filled 2017-06-26: qty 44

## 2017-06-26 MED ORDER — DEXTROSE 5 % IV SOLN
2.0000 g | INTRAVENOUS | Status: DC
  Administered 2017-06-27 – 2017-07-01 (×5): 2 g via INTRAVENOUS
  Filled 2017-06-26 (×5): qty 2

## 2017-06-26 MED ORDER — CALCIUM CARBONATE-VITAMIN D 500-200 MG-UNIT PO TABS
2.0000 | ORAL_TABLET | Freq: Every day | ORAL | Status: DC
  Administered 2017-06-27 – 2017-07-04 (×8): 2 via ORAL
  Filled 2017-06-26 (×8): qty 2

## 2017-06-26 MED ORDER — DILTIAZEM HCL 60 MG PO TABS
60.0000 mg | ORAL_TABLET | Freq: Four times a day (QID) | ORAL | Status: DC
  Administered 2017-06-27 – 2017-07-04 (×30): 60 mg via ORAL
  Filled 2017-06-26 (×30): qty 1

## 2017-06-26 MED ORDER — INSULIN ASPART 100 UNIT/ML ~~LOC~~ SOLN
0.0000 [IU] | Freq: Three times a day (TID) | SUBCUTANEOUS | Status: DC
  Administered 2017-06-27: 2 [IU] via SUBCUTANEOUS
  Administered 2017-06-28 (×2): 1 [IU] via SUBCUTANEOUS
  Administered 2017-06-29 – 2017-06-30 (×2): 2 [IU] via SUBCUTANEOUS
  Administered 2017-06-30: 5 [IU] via SUBCUTANEOUS
  Administered 2017-06-30: 3 [IU] via SUBCUTANEOUS
  Administered 2017-07-01: 2 [IU] via SUBCUTANEOUS
  Administered 2017-07-01: 5 [IU] via SUBCUTANEOUS
  Administered 2017-07-01: 3 [IU] via SUBCUTANEOUS
  Administered 2017-07-02: 5 [IU] via SUBCUTANEOUS
  Administered 2017-07-02: 3 [IU] via SUBCUTANEOUS
  Administered 2017-07-02: 5 [IU] via SUBCUTANEOUS
  Administered 2017-07-03: 3 [IU] via SUBCUTANEOUS
  Administered 2017-07-03: 5 [IU] via SUBCUTANEOUS
  Administered 2017-07-03 – 2017-07-04 (×2): 2 [IU] via SUBCUTANEOUS
  Administered 2017-07-04: 3 [IU] via SUBCUTANEOUS

## 2017-06-26 MED ORDER — DEXTROSE 5 % IV SOLN
2.0000 g | Freq: Once | INTRAVENOUS | Status: AC
  Administered 2017-06-26: 2 g via INTRAVENOUS

## 2017-06-26 MED ORDER — SODIUM CHLORIDE 0.9% FLUSH
3.0000 mL | Freq: Two times a day (BID) | INTRAVENOUS | Status: DC
  Administered 2017-06-27: 3 mL via INTRAVENOUS

## 2017-06-26 MED ORDER — ALBUTEROL SULFATE (2.5 MG/3ML) 0.083% IN NEBU: 2.5000 mg | INHALATION_SOLUTION | Freq: Four times a day (QID) | RESPIRATORY_TRACT | Status: DC | PRN

## 2017-06-26 MED ORDER — SODIUM CHLORIDE 0.9% FLUSH
3.0000 mL | Freq: Two times a day (BID) | INTRAVENOUS | Status: DC
  Administered 2017-06-27 – 2017-07-04 (×10): 3 mL via INTRAVENOUS

## 2017-06-26 MED ORDER — ONDANSETRON HCL 4 MG/2ML IJ SOLN: 4.0000 mg | Freq: Four times a day (QID) | INTRAMUSCULAR | Status: DC | PRN

## 2017-06-26 MED ORDER — SODIUM CHLORIDE 0.9% FLUSH
3.0000 mL | INTRAVENOUS | Status: DC | PRN
  Filled 2017-06-26: qty 10

## 2017-06-26 MED ORDER — SENNOSIDES-DOCUSATE SODIUM 8.6-50 MG PO TABS: 1.0000 | ORAL_TABLET | Freq: Every evening | ORAL | Status: DC | PRN

## 2017-06-26 MED ORDER — POLYETHYL GLYCOL-PROPYL GLYCOL 0.4-0.3 % OP SOLN: 2.0000 [drp] | Freq: Two times a day (BID) | OPHTHALMIC | Status: DC

## 2017-06-26 MED ORDER — INSULIN ASPART 100 UNIT/ML ~~LOC~~ SOLN
0.0000 [IU] | Freq: Every day | SUBCUTANEOUS | Status: DC
  Administered 2017-06-29 – 2017-06-30 (×2): 3 [IU] via SUBCUTANEOUS
  Administered 2017-07-01: 5 [IU] via SUBCUTANEOUS
  Administered 2017-07-02: 2 [IU] via SUBCUTANEOUS
  Administered 2017-07-03: 3 [IU] via SUBCUTANEOUS

## 2017-06-26 MED ORDER — DIPHENHYDRAMINE HCL 25 MG PO CAPS: 50.0000 mg | ORAL_CAPSULE | Freq: Once | ORAL | Status: DC

## 2017-06-26 MED ORDER — ACETAMINOPHEN 325 MG PO TABS
650.0000 mg | ORAL_TABLET | Freq: Four times a day (QID) | ORAL | Status: DC | PRN
  Administered 2017-06-30 – 2017-07-02 (×2): 650 mg via ORAL
  Filled 2017-06-26 (×2): qty 2

## 2017-06-26 MED ORDER — POTASSIUM CHLORIDE CRYS ER 20 MEQ PO TBCR
20.0000 meq | EXTENDED_RELEASE_TABLET | Freq: Two times a day (BID) | ORAL | Status: DC
  Administered 2017-06-26 – 2017-07-02 (×13): 20 meq via ORAL
  Filled 2017-06-26 (×13): qty 1

## 2017-06-26 MED ORDER — SODIUM CHLORIDE 0.9% FLUSH: 3.0000 mL | INTRAVENOUS | Status: DC | PRN

## 2017-06-26 MED ORDER — ACETAMINOPHEN 325 MG PO TABS: 650.0000 mg | ORAL_TABLET | Freq: Once | ORAL | Status: DC

## 2017-06-26 MED ORDER — SODIUM CHLORIDE 0.9% FLUSH
10.0000 mL | INTRAVENOUS | Status: DC | PRN
  Administered 2017-06-27: 10 mL
  Administered 2017-06-29: 20 mL
  Filled 2017-06-26 (×2): qty 40

## 2017-06-26 MED ORDER — FOSTAMATINIB DISODIUM 100 MG PO TABS: 100.0000 mg | ORAL_TABLET | Freq: Two times a day (BID) | ORAL | Status: DC

## 2017-06-26 MED ORDER — RISAQUAD PO CAPS
1.0000 | ORAL_CAPSULE | Freq: Every day | ORAL | Status: DC
  Administered 2017-06-27 – 2017-07-03 (×7): 1 via ORAL
  Filled 2017-06-26 (×7): qty 1

## 2017-06-26 MED ORDER — NALOXONE HCL 2 MG/2ML IJ SOSY
1.0000 mg | PREFILLED_SYRINGE | Freq: Once | INTRAMUSCULAR | Status: AC
  Administered 2017-06-26: 1 mg via INTRAVENOUS
  Filled 2017-06-26: qty 2

## 2017-06-26 MED ORDER — PANTOPRAZOLE SODIUM 40 MG PO TBEC
40.0000 mg | DELAYED_RELEASE_TABLET | Freq: Every day | ORAL | Status: DC
  Administered 2017-06-27 – 2017-07-04 (×8): 40 mg via ORAL
  Filled 2017-06-26 (×8): qty 1

## 2017-06-26 MED ORDER — VALACYCLOVIR HCL 500 MG PO TABS
1000.0000 mg | ORAL_TABLET | Freq: Every day | ORAL | Status: DC
  Administered 2017-06-27 – 2017-07-04 (×8): 1000 mg via ORAL
  Filled 2017-06-26 (×8): qty 2

## 2017-06-26 MED ORDER — PREDNISONE 5 MG PO TABS: 5.0000 mg | ORAL_TABLET | Freq: Every day | ORAL | Status: DC

## 2017-06-26 MED ORDER — BISACODYL 5 MG PO TBEC: 5.0000 mg | DELAYED_RELEASE_TABLET | Freq: Every day | ORAL | Status: DC | PRN

## 2017-06-26 MED ORDER — SODIUM CHLORIDE 0.9 % IV SOLN: Freq: Once | INTRAVENOUS | Status: DC

## 2017-06-26 MED ORDER — SODIUM CHLORIDE 0.9 % IV SOLN: 250.0000 mL | INTRAVENOUS | Status: DC | PRN

## 2017-06-26 MED ORDER — SODIUM CHLORIDE 0.9 % IV SOLN: 375.0000 mg/m2 | Freq: Once | INTRAVENOUS | Status: DC

## 2017-06-26 NOTE — Telephone Encounter (Signed)
Orders faxed to Lattie Haw at [facility] for PICC flushes.   Per [place] protocol, single lumen PICC to be flushed every Monday, Wednesday & Friday with 10cc of Normal Saline followed by 250units/2.44m Heparin. This will continue as long as PICC remains in place.   These orders confirmed and signed by [place]na Peace [place] dph

## 2017-06-26 NOTE — ED Notes (Signed)
Patient is hesitate to sign  consent to transfer to [place], [facility] Supervisor, called and made aware that patient is hesitant to sign the consent.  Ms. Lattie Haw spoke with the patient.

## 2017-06-26 NOTE — ED Triage Notes (Signed)
Sent to ED from office upstairs for low 02sats  Sitter with pt states they forgot to bring her home 02 and sats were low.  Pt denies pain or sob. Monitor shows afib and 02 sats 96 to 100

## 2017-06-26 NOTE — Progress Notes (Signed)
Hematology and Oncology Follow Up Visit  [name] [name] [id number] 30-Mar-1931 83 y.o. 06/26/2017   Principle Diagnosis:  Chronic ITP  Current Therapy:   Rituxan every week  4 weeks - not started yet   Interim History:  Ms. [name] [name] is here today with her caregiver for follow-up and to start her first cycle of Rituxan. She is quite lethargic and unable to answer questions as to where she is and what day it is. I was unable to get much information from her on how she has been since her last visit.  Her O2 sat was 79% on RA when she arrived and we placed her on 2 L [place]. Sat came up to mid 90's. Despite the improvement of her sat she remained lethargic.  She has chronic swelling in her legs that is unchanged. Pedal pulses are +1. She has abrasion to the left shin.  She is afebrile. She states that she hurts in her shoulder. BP is low at 99/44 and HR 66.  Platelet count is 21. She is bruising easily and denies any episodes of bleeding.  No c/o nausea or vomiting.   ECOG Performance Status: 3 - Symptomatic, >50% confined to bed  Medications:  Allergies as of 06/26/2017      Reactions   Keflex [cephalexin] Other (See Comments)   Reaction:  Unknown       Medication List       Accurate as of 06/26/17 11:25 AM. Always use your most recent med list.          ACIDOPHILUS/CITRUS PECTIN Tabs Take 1 tablet by mouth every evening.   albuterol (2.5 MG/3ML) 0.083% nebulizer solution Commonly known as:  PROVENTIL Take 2.5 mg by nebulization every 6 (six) hours as needed for wheezing or shortness of breath.   CALCIUM 600+D 600-200 MG-UNIT Tabs Generic drug:  Calcium Carbonate-Vitamin D Take 2 tablets by mouth daily.   digoxin 0.125 MG tablet Commonly known as:  LANOXIN Take 0.0625 mg by mouth daily.   diltiazem 240 MG 24 hr capsule Commonly known as:  CARDIZEM CD Take 1 capsule (240 mg total) by mouth daily.   famciclovir 250 MG tablet Commonly known as:  FAMVIR Take 1 tablet  (250 mg total) by mouth daily.   Fostamatinib Disodium 100 MG Tabs Commonly known as:  TAVALISSE Take 100 mg by mouth 2 (two) times daily.   gabapentin 300 MG capsule Commonly known as:  NEURONTIN   glipiZIDE 2.5 MG 24 hr tablet Commonly known as:  GLUCOTROL XL   insulin lispro 100 UNIT/ML injection Commonly known as:  HUMALOG Inject 0-15 Units into the skin 3 (three) times daily as needed for high blood sugar. Pt uses as needed per sliding scale:  70-120:  0 units, 121-150:  2 units, 151-200:  3 units, 201-250:  5 units, 251-300:  8 units, 301-350:  11 units, 351-400:  15 units, Greater than 400:  Call MD   LEVEMIR 100 UNIT/ML injection Generic drug:  insulin detemir   metoprolol tartrate 100 MG tablet Commonly known as:  LOPRESSOR Take 1 tablet (100 mg total) by mouth 2 (two) times daily.   morphine 10 MG 24 hr capsule Commonly known as:  KADIAN   naloxone 0.4 MG/ML injection Commonly known as:  NARCAN   NOVOLOG 100 UNIT/ML injection Generic drug:  insulin aspart   OXYCONTIN 10 mg 12 hr tablet Generic drug:  oxyCODONE   pantoprazole 40 MG tablet Commonly known as:  PROTONIX Take 1 tablet (40  mg total) by mouth daily.   potassium chloride SA 20 MEQ tablet Commonly known as:  K-DUR,KLOR-CON Take 1 tablet (20 mEq total) by mouth 2 (two) times daily.   predniSONE 5 MG tablet Commonly known as:  DELTASONE   sitaGLIPtin 50 MG tablet Commonly known as:  JANUVIA Take 50 mg by mouth daily.   sodium chloride 0.65 % Soln nasal spray Commonly known as:  OCEAN Place 1 spray into both nostrils 3 (three) times daily.   SYSTANE 0.4-0.3 % Soln Generic drug:  Polyethyl Glycol-Propyl Glycol Place 2 drops into both eyes 2 (two) times daily.   torsemide 20 MG tablet Commonly known as:  DEMADEX   traMADol 50 MG tablet Commonly known as:  ULTRAM       Allergies:  Allergies  Allergen Reactions  . Keflex [Cephalexin] Other (See Comments)    Reaction:  Unknown      Past Medical History, Surgical history, Social history, and Family History were reviewed and updated.  Review of Systems: All other 10 point review of systems is negative.   Physical Exam:  oral temperature is 97.8 F (36.6 C). Her blood pressure is 99/44 (abnormal) and her pulse is 66. Her respiration is 19.   Wt Readings from Last 3 Encounters:  06/26/17 197 lb (89.4 kg)  01/22/17 187 lb 2.7 oz (84.9 kg)  11/23/16 197 lb 3.2 oz (89.4 kg)    Ocular: Sclerae unicteric, pupils equal, round and reactive to light Ear-nose-throat: Oropharynx clear, dentition fair Lymphatic: No cervical, supraclavicular or axillary adenopathy Lungs no rales or rhonchi, good excursion bilaterally Heart regular rate and rhythm, no murmur appreciated Abd soft, nontender, positive bowel sounds, no liver or spleen tip palpated on exam, no fluid wave MSK no focal spinal tenderness, no joint edema Neuro: non-focal, well-oriented, appropriate affect Breasts: Deferred   Lab Results  Component Value Date   WBC 9.4 06/26/2017   HGB 12.2 06/26/2017   HCT 40.2 06/26/2017   MCV 87 06/26/2017   PLT 21 Platelet count consistent in citrate (L) 06/26/2017   No results found for: FERRITIN, IRON, TIBC, UIBC, IRONPCTSAT Lab Results  Component Value Date   RBC 4.61 06/26/2017   No results found for: KPAFRELGTCHN, LAMBDASER, KAPLAMBRATIO No results found for: IGGSERUM, IGA, IGMSERUM No results found for: Odetta Pink, SPEI   Chemistry      Component Value Date/Time   NA 143 06/26/2017 1005   NA 136 02/25/2017 1056   K 3.7 06/26/2017 1005   K 4.4 02/25/2017 1056   CL 104 06/26/2017 1005   CO2 30 06/26/2017 1005   CO2 25 02/25/2017 1056   BUN 29 (H) 06/26/2017 1005   BUN 49.1 (H) 02/25/2017 1056   CREATININE 1.4 (H) 06/26/2017 1005   CREATININE 1.6 (H) 02/25/2017 1056      Component Value Date/Time   CALCIUM 9.2 06/26/2017 1005   CALCIUM 9.3  02/25/2017 1056   ALKPHOS 99 (H) 06/26/2017 1005   ALKPHOS 91 02/25/2017 1056   AST 14 06/26/2017 1005   AST 19 02/25/2017 1056   ALT 13 06/26/2017 1005   ALT 23 02/25/2017 1056   BILITOT 0.70 06/26/2017 1005   BILITOT 0.83 02/25/2017 1056      Impression and Plan: Ms. [name] [name] is a pleasant 83 yo female with chronic ITP. She is here today to start her first cycle of Rituxan but was found to be quite lethargic and unable to answer questions during her exam. Her  sat has 79% on arrival and then improved on 2L [place]. Her mentation had not improved with the sat. I spoke with Dr. Marin Olp and we will hold treatment and send her down to the ED for further work up.  I have notified the on call ER doctor as well as the nursing supervisor of her nursing home.  We will follow-up in the hospital if she is admitted. If not, we will plan to see her back in 1 week.  Her living facility will contact our office with any questions or concerns.  Eliezer Bottom, NP 10/10/20184:34 PM

## 2017-06-26 NOTE — ED Notes (Signed)
Pt's wheel chair was taken back to the [facility]  By the staff

## 2017-06-26 NOTE — Progress Notes (Signed)
Patient will not be getting chemo today per Judson Roch NP - [facility] and Arbie Cookey in [place] aware and chemo not made.

## 2017-06-26 NOTE — H&P (Signed)
History and Physical    [name] [name] INO:[id number] DOB: [birth year] DOA: 06/26/2017  PCP: Lajean Manes, MD   Patient coming from: [facility]  Chief Complaint: Altered mental status  HPI: [name] [name] is a 83 y.o. female with medical history significant for paroxysmal atrial fibrillation not on anticoagulation, coronary artery disease with history of stents, chronic diastolic CHF, hypertension,and chronic ITP, no presenting to the emergency department for evaluation of altered mental status. She had presented to the [place] for first cycle of Rituxan, but was found to be very fatigued and confused. She was hypoxic with saturation of 79% on room air, with caregiver reporting that they forgot to bring her oxygen tank. She was treated with supplemental oxygen with improvement in her saturations, but she continued to be altered and lethargic and was directed to the ED for further evaluation. She had not been reporting any specific complaints, there is no recent fall or trauma, and no new medications.   [facility] ED Course: Upon arrival to the [facility] ED, patient is found to be febrile, saturating well on her usual 2 L/m supplemental oxygen,blood pressure of 78/52, and vitals otherwise stable. EKG features a sinus rhythm with PVCs and low voltage QRS. Chest x-ray is notable for low volumes and increased pulmonary interstitial opacity concerning for edema. Chemistry panel reveals a creatinine of 1.4, slightly up from apparent baseline. CBC is notable for thrombocytopenia with platelets 21,000, down from 40,000 couple weeks ago. Troponin is undetectable, BNP is elevated to 704, and d-dimer is elevated to 2.14. Urinalysis is suggestive of infection and urine was sent for culture. Non-contrast head CT is negative for acute intracranial abnormality. CT chest is negative for PE, but demonstrates marked cardiomegaly and extensive ground-glass opacities concerning for interstitial edema.  Patient was treated with Narcan in the ED and became more alert with this. She continued to be confused however. She was treated with empiric cefepime and has been transferred to [facility] for admission to the telemetry unit.  Review of Systems:  Unable to complete ROS secondary to patient's clinical condition with AMS.  Past Medical History:  Diagnosis Date  . Atrial fibrillation ([place])   . CAD (coronary artery disease)    a. NSTEMI 2/14 => LHC 10/20/12:  dLM 20%, pLAD 99%, mLAD 60% followed by 99%, oD1 99%, and pD2 30%, mild plaque disease in the CFX and RCA. PCI: Xience Xpedition DES to the proximal and mid LAD  . Chronic diastolic CHF (congestive heart failure) ([place])    Echo (08/10/13): Mild LVH, EF 60-65%.  . DJD (degenerative joint disease)   . Dyslipidemia   . HTN (hypertension)   . Osteopenia   . Thrombocytopenia ([place])   . Venous ulcer ([place])     Past Surgical History:  Procedure Laterality Date  . BACK SURGERY     Lumbar disc  . EYE SURGERY     Blepharoplasty  . HIP SURGERY     R THR  . INSERTION / PLACEMENT / REVISION NEUROSTIMULATOR    . IR FLUORO GUIDE CV LINE RIGHT  06/25/2017  . IR US GUIDE VASC ACCESS RIGHT  06/25/2017  . JOINT REPLACEMENT    . LEFT HEART CATHETERIZATION WITH CORONARY ANGIOGRAM N/A 10/20/2012   Procedure: LEFT HEART CATHETERIZATION WITH CORONARY ANGIOGRAM;  Surgeon: Burnell Blanks, MD;  Location: [facility] CATH LAB;  Service: Cardiovascular;  Laterality: N/A;     reports that she has never smoked. She has never used smokeless tobacco.  She reports that she does not drink alcohol or use drugs.  Allergies  Allergen Reactions  . Keflex [Cephalexin] Other (See Comments)    Reaction:  Unknown     Family History  Problem Relation Age of Onset  . CAD Father 63       Vague history      Prior to Admission medications   Medication Sig Start Date End Date Taking? Authorizing Provider  albuterol (PROVENTIL) (2.5 MG/3ML) 0.083% nebulizer  solution Take 2.5 mg by nebulization every 6 (six) hours as needed for wheezing or shortness of breath.    [provider]  Calcium Carbonate-Vitamin D (CALCIUM 600+D) 600-200 MG-UNIT TABS Take 2 tablets by mouth daily.    [provider]  digoxin (LANOXIN) 0.125 MG tablet Take 0.0625 mg by mouth daily.    [provider]  diltiazem (CARDIZEM CD) 240 MG 24 hr capsule Take 1 capsule (240 mg total) by mouth daily. Patient taking differently: Take 240 mg by mouth every evening.  08/18/13   Velvet Bathe, MD  famciclovir (FAMVIR) 250 MG tablet Take 1 tablet (250 mg total) by mouth daily. 08/03/16   [place], Holli Humbles, NP  Fostamatinib Disodium (TAVALISSE) 100 MG TABS Take 100 mg by mouth 2 (two) times daily. 02/25/17   [place], Holli Humbles, NP  gabapentin (NEURONTIN) 300 MG capsule  05/29/17   [provider]  glipiZIDE (GLUCOTROL XL) 2.5 MG 24 hr tablet  05/31/17   [provider]  insulin lispro (HUMALOG) 100 UNIT/ML injection Inject 0-15 Units into the skin 3 (three) times daily as needed for high blood sugar. Pt uses as needed per sliding scale:  70-120:  0 units, 121-150:  2 units, 151-200:  3 units, 201-250:  5 units, 251-300:  8 units, 301-350:  11 units, 351-400:  15 units, Greater than 400:  Call MD    [provider]  Lactobacillus Acid-Pectin (ACIDOPHILUS/CITRUS PECTIN) TABS Take 1 tablet by mouth every evening.     [provider]  LEVEMIR 100 UNIT/ML injection  05/17/17   [provider]  metoprolol (LOPRESSOR) 100 MG tablet Take 1 tablet (100 mg total) by mouth 2 (two) times daily. 08/18/13   Velvet Bathe, MD  morphine (KADIAN) 10 MG 24 hr capsule  01/15/17   [provider]  naloxone Karma Greaser) 0.4 MG/ML injection  01/18/17   [provider]  NOVOLOG 100 UNIT/ML injection  02/25/17   [provider]  OXYCONTIN 10 MG 12 hr tablet  02/03/17   [provider]  pantoprazole (PROTONIX) 40 MG tablet  Take 1 tablet (40 mg total) by mouth daily. 08/03/16   [place], Holli Humbles, NP  Polyethyl Glycol-Propyl Glycol (SYSTANE) 0.4-0.3 % SOLN Place 2 drops into both eyes 2 (two) times daily.    [provider]  potassium chloride SA (K-DUR,KLOR-CON) 20 MEQ tablet Take 1 tablet (20 mEq total) by mouth 2 (two) times daily. 08/28/13   Richardson Dopp T, PA-C  predniSONE (DELTASONE) 5 MG tablet  05/01/17   [provider]  sitaGLIPtin (JANUVIA) 50 MG tablet Take 50 mg by mouth daily.    [provider]  sodium chloride (OCEAN) 0.65 % SOLN nasal spray Place 1 spray into both nostrils 3 (three) times daily.     [provider]  torsemide (DEMADEX) 20 MG tablet  05/29/17   [provider]  traMADol Veatrice Bourbon) 50 MG tablet  03/04/17   [provider]    Physical Exam: Vitals:  06/26/17 1745 06/26/17 1749 06/26/17 1753 06/26/17 1901  BP: (!) 85/43 (!) 100/43  (!) 108/46  Pulse: 78 77  67  Resp: 18 20  (!) 22  Temp:   (!) 97.5 F (36.4 C) 98.2 F (36.8 C)  TempSrc:   Oral Oral  SpO2: 93% 90%  97%  Weight:          Constitutional: NAD, calm, comfortable Eyes: PERTLA, lids and conjunctivae normal ENMT: Mucous membranes are moist. Posterior pharynx clear of any exudate or lesions.   Neck: normal, supple, no masses, no thyromegaly Respiratory: Fine rales bilaterally, no wheezing. Normal respiratory effort. No accessory muscle use.  Cardiovascular: S1 & S2 heard, regular rate and rhythm. Pretibial pitting edema bilaterally. No significant JVD. Abdomen: No distension, no tenderness, no masses palpated. Bowel sounds normal.  Musculoskeletal: no clubbing / cyanosis. No joint deformity upper and lower extremities.   Skin: scattered skin tears and ecchymoses, hematoma at medial lower RLE, ulcer at left shin. Warm, dry, well-perfused. Neurologic: CN 2-12 grossly intact. Sensation intact. Strength 5/5 in all 4 limbs. Somnolent, easily roused Psychiatric:  Somnolent, easily roused, oriented to person and place only. Pleasant, cooperative.     Labs on Admission: I have personally reviewed following labs and imaging studies  CBC:  Recent Labs Lab 06/26/17 1005  WBC 9.4  NEUTROABS 7.0*  HGB 12.2  HCT 40.2  MCV 87  PLT 21 Platelet count consistent in citrate*   Basic Metabolic Panel:  Recent Labs Lab 06/26/17 1005  NA 143  K 3.7  CL 104  CO2 30  GLUCOSE 159*  BUN 29*  CREATININE 1.4*  CALCIUM 9.2   GFR: Estimated Creatinine Clearance: 31.7 mL/min (A) (by C-G formula based on SCr of 1.4 mg/dL (H)). Liver Function Tests:  Recent Labs Lab 06/26/17 1005  AST 14  ALT 13  ALKPHOS 99*  BILITOT 0.70  PROT 6.1*  ALBUMIN 2.4*   No results for input(s): LIPASE, AMYLASE in the last 168 hours. No results for input(s): AMMONIA in the last 168 hours. Coagulation Profile: No results for input(s): INR, PROTIME in the last 168 hours. Cardiac Enzymes:  Recent Labs Lab 06/26/17 1308  TROPONINI <0.03   BNP (last 3 results) No results for input(s): PROBNP in the last 8760 hours. HbA1C: No results for input(s): HGBA1C in the last 72 hours. CBG: No results for input(s): GLUCAP in the last 168 hours. Lipid Profile: No results for input(s): CHOL, HDL, LDLCALC, TRIG, CHOLHDL, LDLDIRECT in the last 72 hours. Thyroid Function Tests: No results for input(s): TSH, T4TOTAL, FREET4, T3FREE, THYROIDAB in the last 72 hours. Anemia Panel: No results for input(s): VITAMINB12, FOLATE, FERRITIN, TIBC, IRON, RETICCTPCT in the last 72 hours. Urine analysis:    Component Value Date/Time   COLORURINE YELLOW 06/26/2017 1400   APPEARANCEUR CLOUDY (A) 06/26/2017 1400   LABSPEC 1.010 06/26/2017 1400   PHURINE 6.0 06/26/2017 1400   GLUCOSEU NEGATIVE 06/26/2017 1400   HGBUR LARGE (A) 06/26/2017 1400   BILIRUBINUR NEGATIVE 06/26/2017 1400   KETONESUR NEGATIVE 06/26/2017 1400   PROTEINUR NEGATIVE 06/26/2017 1400   UROBILINOGEN 0.2 08/10/2013  0037   NITRITE POSITIVE (A) 06/26/2017 1400   LEUKOCYTESUR LARGE (A) 06/26/2017 1400   Sepsis Labs: _0 (procalcitonin:4,lacticidven:4) )No results found for this or any previous visit (from the past 240 hour(s)).   Radiological Exams on Admission: Ct Head Wo Contrast  Result Date: 06/26/2017 CLINICAL DATA:  Altered mental status EXAM: CT HEAD WITHOUT CONTRAST TECHNIQUE: Contiguous axial images were obtained  from the base of the skull through the vertex without intravenous contrast. COMPARISON:  Head CT 01/18/2017 FINDINGS: Brain: No mass lesion, intraparenchymal hemorrhage or extra-axial collection. No evidence of acute cortical infarct. There is periventricular hypoattenuation compatible with chronic microvascular disease. Vascular: No hyperdense vessel or unexpected calcification. Skull: Normal visualized skull base, calvarium and extracranial soft tissues. Sinuses/Orbits: No sinus fluid levels or advanced mucosal thickening. No mastoid effusion. Normal orbits. IMPRESSION: Chronic microvascular ischemia without acute intracranial abnormality. Electronically Signed   By: Ulyses Jarred M.D.   On: 06/26/2017 15:06   Ct Angio Chest Pe W And/or Wo Contrast  Result Date: 06/26/2017 CLINICAL DATA:  Altered level of consciousness and decreased oxygen saturation today. EXAM: CT ANGIOGRAPHY CHEST WITH CONTRAST TECHNIQUE: Multidetector CT imaging of the chest was performed using the standard protocol during bolus administration of intravenous contrast. Multiplanar CT image reconstructions and MIPs were obtained to evaluate the vascular anatomy. CONTRAST:  100 cc Isovue 370. COMPARISON:  CT chest 01/19/2017. Single-view of the chest earlier today. FINDINGS: Cardiovascular: No pulmonary embolus is identified. There is calcific aortic and coronary atherosclerosis. Cardiomegaly is seen. No pericardial effusion. Contrast refluxes into the inferior vena cava and hepatic veins compatible with right heart  insufficiency. Mediastinum/Nodes: No enlarged mediastinal, hilar, or axillary lymph nodes. Thyroid gland, trachea, and esophagus demonstrate no significant findings. Lungs/Pleura: No pleural effusion. As on the prior CT, the lungs demonstrate extensive ground-glass attenuation with an upper lobe predominance. No nodule or mass is identified. Upper Abdomen: No acute abnormality. Musculoskeletal: Spinal stimulator is in place. There is exaggeration of the normal thoracic kyphosis. Multilevel degenerative disc disease is identified. No fracture. No lytic or sclerotic lesion. Review of the MIP images confirms the above findings. IMPRESSION: Negative for pulmonary embolus. Extensive ground-glass attenuation in the lungs is most worrisome for congestive heart failure given marked cardiomegaly. Aortic Atherosclerosis (ICD10-I70.0). Electronically Signed   By: Inge Rise M.D.   On: 06/26/2017 15:13   Ir Fluoro Guide Cv Line Right  Result Date: 06/25/2017 INDICATION: Poor venous access. In need of intravenous access for short-term chemotherapy administration. EXAM: ULTRASOUND AND FLUOROSCOPIC GUIDED PICC LINE INSERTION MEDICATIONS: None. CONTRAST:  None FLUOROSCOPY TIME:  12 seconds (8 mGy) COMPLICATIONS: None immediate. TECHNIQUE: The procedure, risks, benefits, and alternatives were explained to the patient and informed written consent was obtained. A timeout was performed prior to the initiation of the procedure. The right upper extremity was prepped with chlorhexidine in a sterile fashion, and a sterile drape was applied covering the operative field. Maximum barrier sterile technique with sterile gowns and gloves were used for the procedure. A timeout was performed prior to the initiation of the procedure. Local anesthesia was provided with 1% lidocaine. Under direct ultrasound guidance, the basilic vein was accessed with a micropuncture kit after the overlying soft tissues were anesthetized with 1% lidocaine.  After the overlying soft tissues were anesthetized, a small venotomy incision was created and a micropuncture kit was utilized to access the right basilic vein. Real-time ultrasound guidance was utilized for vascular access including the acquisition of a permanent ultrasound image documenting patency of the accessed vessel. A guidewire was advanced to the level of the superior caval-atrial junction for measurement purposes and the PICC line was cut to length. A peel-away sheath was placed and a 34 cm, 5 [place], single lumen was inserted to level of the superior caval-atrial junction. A post procedure spot fluoroscopic was obtained. The catheter easily aspirated and flushed and was sutured in place.  A dressing was placed. The patient tolerated the procedure well without immediate post procedural complication. FINDINGS: After catheter placement, the tip lies within the superior cavoatrial junction. The catheter aspirates and flushes normally and is ready for immediate use. IMPRESSION: Successful ultrasound and fluoroscopic guided placement of a right basilic vein approach, 34 cm, 5 [place], single lumen PICC with tip at the superior caval-atrial junction. The PICC line is ready for immediate use. Electronically Signed   By: Sandi Mariscal M.D.   On: 06/25/2017 12:26   Ir US Guide Vasc Access Right  Result Date: 06/25/2017 INDICATION: Poor venous access. In need of intravenous access for short-term chemotherapy administration. EXAM: ULTRASOUND AND FLUOROSCOPIC GUIDED PICC LINE INSERTION MEDICATIONS: None. CONTRAST:  None FLUOROSCOPY TIME:  12 seconds (8 mGy) COMPLICATIONS: None immediate. TECHNIQUE: The procedure, risks, benefits, and alternatives were explained to the patient and informed written consent was obtained. A timeout was performed prior to the initiation of the procedure. The right upper extremity was prepped with chlorhexidine in a sterile fashion, and a sterile drape was applied covering the operative  field. Maximum barrier sterile technique with sterile gowns and gloves were used for the procedure. A timeout was performed prior to the initiation of the procedure. Local anesthesia was provided with 1% lidocaine. Under direct ultrasound guidance, the basilic vein was accessed with a micropuncture kit after the overlying soft tissues were anesthetized with 1% lidocaine. After the overlying soft tissues were anesthetized, a small venotomy incision was created and a micropuncture kit was utilized to access the right basilic vein. Real-time ultrasound guidance was utilized for vascular access including the acquisition of a permanent ultrasound image documenting patency of the accessed vessel. A guidewire was advanced to the level of the superior caval-atrial junction for measurement purposes and the PICC line was cut to length. A peel-away sheath was placed and a 34 cm, 5 [place], single lumen was inserted to level of the superior caval-atrial junction. A post procedure spot fluoroscopic was obtained. The catheter easily aspirated and flushed and was sutured in place. A dressing was placed. The patient tolerated the procedure well without immediate post procedural complication. FINDINGS: After catheter placement, the tip lies within the superior cavoatrial junction. The catheter aspirates and flushes normally and is ready for immediate use. IMPRESSION: Successful ultrasound and fluoroscopic guided placement of a right basilic vein approach, 34 cm, 5 French, single lumen PICC with tip at the superior caval-atrial junction. The PICC line is ready for immediate use. Electronically Signed   By: Sandi Mariscal M.D.   On: 06/25/2017 12:26   Dg Chest Port 1 View  Result Date: 06/26/2017 CLINICAL DATA:  83 year old female with hypoxia. EXAM: PORTABLE CHEST 1 VIEW COMPARISON:  01/21/2017 and earlier. FINDINGS: Portable AP semi upright view at 1143 hours. Lower lung volumes. Stable cardiac size and mediastinal contours. Right  side PICC line in place today. Chronic thoracic spinal stimulator device and lumbar fusion. Increased pulmonary vascularity and/or interstitial markings are similar to May, when chest CT demonstrated abnormal ground-glass opacity and septal thickening. No pneumothorax, pleural effusion, or consolidation. Advanced degenerative changes at both shoulders. Negative visible bowel gas pattern. IMPRESSION: Lower lung volumes. Increased pulmonary interstitial opacity similar to May, suspect acute interstitial edema. Viral/atypical respiratory infection fell less likely. Electronically Signed   By: Genevie Ann M.D.   On: 06/26/2017 12:27    EKG: Independently reviewed. Sinus rhythm, PVC's, low-voltage.   Assessment/Plan  1. Acute encephalopathy  - Pt presents with lethargy and confusion  -  Workup revealed UTI; she became more alert with Narcan, but remains confused  - Anticipate improvement with treatment of UTI and by minimizing narcotics as below    2. UTI  - Pt presents with AMS, urine suggestive of infection and sent for culture - Empiric cefepime started in ED  - Prior cultures with Klebsiella resistant to ampicillin and nitrofurantoin, and E coli resistant to FQ's  - Plan to continue cefepime pending culture data  3. Acute on chronic diastolic CHF  - Pt noted to have pulmonary edema on imaging, crackles on exam, but no increased WOB and saturating well on her usual FiO2  - BP has been low, and as she is not having any respiratory sxs, will avoid aggressive diuresis  - Plan to SLIV, fluid-restrict diet, follow daily wts and I/O's, continue torsemide and digoxin, hold beta-blocker until BP allows   4. ITP - Followed by oncology and worsening  - Platelets 21k on admission, down from 40k two weeks earlier  - Was to start Rituxan on 10/10, but was sent to ED instead d/t AMS  - Oncologist added to treatment team    5. CKD stage III  - SCr is 1.4 on admission, slightly up from her apparent baseline,  possibly d/t congestion/acute CHF  - Follow fluid-status as above, repeat chem panel in am    6. Hypertension  - BP running low in ED  - Hold Lopressor until BP allows    7. Paroxysmal atrial fibrillation  - In a sinus rhythm on admission  - CHADS-VASc is 23 (age x2, gender, DM, HTN, CHF)  - Not anticoagulated; conitnue digoxin and diltiazem as tolerated    8. Insulin-dependent DM  - A1c was 5.8% remotely  - Managed at home with insulin, doses unknown, will check CBG with meals and qHS and start a low-intensity SSI pending med-rec    9. Chronic pain - Concern for narcotics contributing to presentation with AMS  - Hold her long-acting opiate and continue low-dose Ultram prn     DVT prophylaxis: SCD's Code Status: DNR Family Communication: Discussed with patient Disposition Plan: Admit to telemetry Consults called: None Admission status: Inpatient    Vianne Bulls, MD [facility] Pager [phone]  If 7PM-7AM, please contact night-coverage [email or web address] Password [email or web address]  06/26/2017, 7:29 PM

## 2017-06-26 NOTE — ED Provider Notes (Signed)
[facility] DEPT [facility] Provider Note   CSN: [id number] Arrival date & time: 06/26/17  1122     History   Chief Complaint Chief Complaint  Patient presents with  . Fatigue    HPI [name] [name] is a 83 y.o. female.  HPI Patient is on 2 L of home O2. She was seen in her doctor's office but did not bring her supplemental oxygen. Noted To be hypoxic with saturations into the 80s. Patient was also quite drowsy. Referred to the emergency department for evaluation. Patient denies any pain, difficulty breathing denies taking any new medication. Past Medical History:  Diagnosis Date  . Atrial fibrillation ([place])   . CAD (coronary artery disease)    a. NSTEMI 2/14 => LHC 10/20/12:  dLM 20%, pLAD 99%, mLAD 60% followed by 99%, oD1 99%, and pD2 30%, mild plaque disease in the CFX and RCA. PCI: Xience Xpedition DES to the proximal and mid LAD  . Chronic diastolic CHF (congestive heart failure) ([place])    Echo (08/10/13): Mild LVH, EF 60-65%.  . DJD (degenerative joint disease)   . Dyslipidemia   . HTN (hypertension)   . Osteopenia   . Thrombocytopenia ([place])   . Venous ulcer [facility])     Patient Active Problem List   Diagnosis Date Noted  . Altered mental status 01/18/2017  . Chronic diastolic heart failure ([place]) 01/18/2017  . Chronic respiratory failure with hypoxia ([place]) 01/18/2017  . Stage III chronic kidney disease ([place]) 11/21/2016  . Obesity (BMI 30.0-34.9) 11/21/2016  . Venous stasis ulcer ([place]) 11/21/2016  . Venous stasis dermatitis of both lower extremities 11/21/2016  . MRSA carrier 11/21/2016  . Acute on chronic diastolic CHF (congestive heart failure) ([place]) 11/20/2016  . Goals of care, counseling/discussion   . Palliative care by specialist   . Chronic ITP (idiopathic thrombocytopenia) ([place]) 10/01/2016  . Gangrene ([place])   . Acute on chronic respiratory failure with hypoxia ([place])   . Coronary atherosclerosis of native coronary artery 10/09/2013  . Atrial fibrillation  ([place]) 10/09/2013  . Leukocytosis 08/11/2013  . Chronic narcotic dependence ([place]) 07/16/2013  . Varicose veins of lower extremities with other complications 34/28/7681  . Edema 05/14/2013  . Hypokalemia 12/20/2012  . CAD (coronary artery disease) of artery bypass graft 12/18/2012  . Unstable angina ([place]) 10/18/2012  . Thrombocytopenia ([place]) 10/18/2012  . HTN (hypertension) 10/17/2012  . Chronic back pain 10/17/2012  . Precordial pain 10/17/2012    Past Surgical History:  Procedure Laterality Date  . BACK SURGERY     Lumbar disc  . EYE SURGERY     Blepharoplasty  . HIP SURGERY     R THR  . INSERTION / PLACEMENT / REVISION NEUROSTIMULATOR    . IR FLUORO GUIDE CV LINE RIGHT  06/25/2017  . IR US GUIDE VASC ACCESS RIGHT  06/25/2017  . JOINT REPLACEMENT    . LEFT HEART CATHETERIZATION WITH CORONARY ANGIOGRAM N/A 10/20/2012   Procedure: LEFT HEART CATHETERIZATION WITH CORONARY ANGIOGRAM;  Surgeon: Burnell Blanks, MD;  Location: [facility] CATH LAB;  Service: Cardiovascular;  Laterality: N/A;    OB History    No data available       Home Medications    Prior to Admission medications   Medication Sig Start Date End Date Taking? Authorizing Provider  albuterol (PROVENTIL) (2.5 MG/3ML) 0.083% nebulizer solution Take 2.5 mg by nebulization every 6 (six) hours as needed for wheezing or shortness of breath.    [provider]  Calcium Carbonate-Vitamin D (  CALCIUM 600+D) 600-200 MG-UNIT TABS Take 2 tablets by mouth daily.    [provider]  digoxin (LANOXIN) 0.125 MG tablet Take 0.0625 mg by mouth daily.    [provider]  diltiazem (CARDIZEM CD) 240 MG 24 hr capsule Take 1 capsule (240 mg total) by mouth daily. Patient taking differently: Take 240 mg by mouth every evening.  08/18/13   Velvet Bathe, MD  famciclovir (FAMVIR) 250 MG tablet Take 1 tablet (250 mg total) by mouth daily. 08/03/16   [place], Holli Humbles, NP  Fostamatinib Disodium (TAVALISSE) 100 MG  TABS Take 100 mg by mouth 2 (two) times daily. 02/25/17   [place], Holli Humbles, NP  gabapentin (NEURONTIN) 300 MG capsule  05/29/17   [provider]  glipiZIDE (GLUCOTROL XL) 2.5 MG 24 hr tablet  05/31/17   [provider]  insulin lispro (HUMALOG) 100 UNIT/ML injection Inject 0-15 Units into the skin 3 (three) times daily as needed for high blood sugar. Pt uses as needed per sliding scale:  70-120:  0 units, 121-150:  2 units, 151-200:  3 units, 201-250:  5 units, 251-300:  8 units, 301-350:  11 units, 351-400:  15 units, Greater than 400:  Call MD    [provider]  Lactobacillus Acid-Pectin (ACIDOPHILUS/CITRUS PECTIN) TABS Take 1 tablet by mouth every evening.     [provider]  LEVEMIR 100 UNIT/ML injection  05/17/17   [provider]  metoprolol (LOPRESSOR) 100 MG tablet Take 1 tablet (100 mg total) by mouth 2 (two) times daily. 08/18/13   Velvet Bathe, MD  morphine (KADIAN) 10 MG 24 hr capsule  01/15/17   [provider]  naloxone Karma Greaser) 0.4 MG/ML injection  01/18/17   [provider]  NOVOLOG 100 UNIT/ML injection  02/25/17   [provider]  OXYCONTIN 10 MG 12 hr tablet  02/03/17   [provider]  pantoprazole (PROTONIX) 40 MG tablet Take 1 tablet (40 mg total) by mouth daily. 08/03/16   [place], Holli Humbles, NP  Polyethyl Glycol-Propyl Glycol (SYSTANE) 0.4-0.3 % SOLN Place 2 drops into both eyes 2 (two) times daily.    [provider]  potassium chloride SA (K-DUR,KLOR-CON) 20 MEQ tablet Take 1 tablet (20 mEq total) by mouth 2 (two) times daily. 08/28/13   Richardson Dopp T, PA-C  predniSONE (DELTASONE) 5 MG tablet  05/01/17   [provider]  sitaGLIPtin (JANUVIA) 50 MG tablet Take 50 mg by mouth daily.    [provider]  sodium chloride (OCEAN) 0.65 % SOLN nasal spray Place 1 spray into both nostrils 3 (three) times daily.     [provider]  torsemide (DEMADEX) 20 MG tablet   05/29/17   [provider]  traMADol Veatrice Bourbon) 50 MG tablet  03/04/17   [provider]    Family History Family History  Problem Relation Age of Onset  . CAD Father 22       Vague history     Social History Social History  Substance Use Topics  . Smoking status: Never Smoker  . Smokeless tobacco: Never Used  . Alcohol use No     Allergies   Keflex [cephalexin]   Review of Systems Review of Systems  Constitutional: Positive for fatigue. Negative for chills and fever.  Respiratory: Negative for cough and shortness of breath.   Cardiovascular: Negative for chest pain.  Gastrointestinal: Negative for abdominal pain, constipation, diarrhea, nausea and vomiting.  Genitourinary: Negative for dysuria and flank pain.  Musculoskeletal: Negative for back pain, neck pain and neck stiffness.  Skin: Negative for rash and wound.  Neurological: Negative for weakness and numbness.  All other systems reviewed and are negative.    Physical Exam Updated Vital Signs BP (!) 108/58 (BP Location: Left Arm)   Pulse 62   Temp 98 F (36.7 C) (Oral)   Resp 19   Wt 89.4 kg (197 lb)   SpO2 100%   BMI 34.90 kg/m   Physical Exam  Constitutional: She is oriented to person, place, and time. She appears well-developed and well-nourished.  Sleeping but arousable  HENT:  Head: Normocephalic and atraumatic.  Mouth/Throat: Oropharynx is clear and moist. No oropharyngeal exudate.  Eyes: Pupils are equal, round, and reactive to light. EOM are normal.  Neck: Normal range of motion. Neck supple. No JVD present.  Cardiovascular: Normal rate and regular rhythm.  Exam reveals no gallop and no friction rub.   No murmur heard. Pulmonary/Chest: Effort normal. She has rales.  Rales in bilateral bases.  Abdominal: Soft. Bowel sounds are normal. There is no tenderness. There is no rebound and no guarding.  Musculoskeletal: Normal range of motion. She exhibits edema. She exhibits no  tenderness.  1+ bilateral lower extremity edema with hyperpigmentation  Neurological: She is oriented to person, place, and time.  Moves all extremities without deficit. Sensation intact.  Skin: Skin is warm and dry. No rash noted. No erythema.  Psychiatric: She has a normal mood and affect. Her behavior is normal.  Nursing note and vitals reviewed.    ED Treatments / Results  Labs (all labs ordered are listed, but only abnormal results are displayed) Labs Reviewed  BRAIN NATRIURETIC PEPTIDE - Abnormal; Notable for the following:       Result Value   B Natriuretic Peptide 704.4 (*)    All other components within normal limits  D-DIMER, QUANTITATIVE (NOT AT [facility]) - Abnormal; Notable for the following:    D-Dimer, Quant 2.14 (*)    All other components within normal limits  URINALYSIS, ROUTINE W REFLEX MICROSCOPIC - Abnormal; Notable for the following:    APPearance CLOUDY (*)    Hgb urine dipstick LARGE (*)    Nitrite POSITIVE (*)    Leukocytes, UA LARGE (*)    All other components within normal limits  URINALYSIS, MICROSCOPIC (REFLEX) - Abnormal; Notable for the following:    Bacteria, UA FEW (*)    Squamous Epithelial / LPF 0-5 (*)    All other components within normal limits  I-STAT ARTERIAL BLOOD GAS, ED - Abnormal; Notable for the following:    pH, Arterial 7.475 (*)    pO2, Arterial 57.0 (*)    Bicarbonate 29.8 (*)    Acid-Base Excess 6.0 (*)    All other components within normal limits  URINE CULTURE  TROPONIN I  RAPID URINE DRUG SCREEN, HOSP PERFORMED  ETHANOL  CBC WITH DIFFERENTIAL/PLATELET  BLOOD GAS, ARTERIAL    EKG  EKG Interpretation  Date/Time:  Wednesday June 26 2017 11:35:55 EDT Ventricular Rate:  69 PR Interval:    QRS Duration: 104 QT Interval:  407 QTC Calculation: 436 R Axis:   -71 Text Interpretation:  Sinus rhythm Multiple premature complexes, vent & supraven LAD, consider left anterior fascicular block Low voltage, precordial leads  Anteroseptal infarct, old Minimal ST depression, diffuse leads Confirmed by Julianne Rice [phone]) on 06/26/2017 4:14:40 PM       Radiology Ct Head Wo Contrast  Result Date: 06/26/2017 CLINICAL DATA:  Altered  mental status EXAM: CT HEAD WITHOUT CONTRAST TECHNIQUE: Contiguous axial images were obtained from the base of the skull through the vertex without intravenous contrast. COMPARISON:  Head CT 01/18/2017 FINDINGS: Brain: No mass lesion, intraparenchymal hemorrhage or extra-axial collection. No evidence of acute cortical infarct. There is periventricular hypoattenuation compatible with chronic microvascular disease. Vascular: No hyperdense vessel or unexpected calcification. Skull: Normal visualized skull base, calvarium and extracranial soft tissues. Sinuses/Orbits: No sinus fluid levels or advanced mucosal thickening. No mastoid effusion. Normal orbits. IMPRESSION: Chronic microvascular ischemia without acute intracranial abnormality. Electronically Signed   By: Ulyses Jarred M.D.   On: 06/26/2017 15:06   Ct Angio Chest Pe W And/or Wo Contrast  Result Date: 06/26/2017 CLINICAL DATA:  Altered level of consciousness and decreased oxygen saturation today. EXAM: CT ANGIOGRAPHY CHEST WITH CONTRAST TECHNIQUE: Multidetector CT imaging of the chest was performed using the standard protocol during bolus administration of intravenous contrast. Multiplanar CT image reconstructions and MIPs were obtained to evaluate the vascular anatomy. CONTRAST:  100 cc Isovue 370. COMPARISON:  CT chest 01/19/2017. Single-view of the chest earlier today. FINDINGS: Cardiovascular: No pulmonary embolus is identified. There is calcific aortic and coronary atherosclerosis. Cardiomegaly is seen. No pericardial effusion. Contrast refluxes into the inferior vena cava and hepatic veins compatible with right heart insufficiency. Mediastinum/Nodes: No enlarged mediastinal, hilar, or axillary lymph nodes. Thyroid gland, trachea, and  esophagus demonstrate no significant findings. Lungs/Pleura: No pleural effusion. As on the prior CT, the lungs demonstrate extensive ground-glass attenuation with an upper lobe predominance. No nodule or mass is identified. Upper Abdomen: No acute abnormality. Musculoskeletal: Spinal stimulator is in place. There is exaggeration of the normal thoracic kyphosis. Multilevel degenerative disc disease is identified. No fracture. No lytic or sclerotic lesion. Review of the MIP images confirms the above findings. IMPRESSION: Negative for pulmonary embolus. Extensive ground-glass attenuation in the lungs is most worrisome for congestive heart failure given marked cardiomegaly. Aortic Atherosclerosis (ICD10-I70.0). Electronically Signed   By: Inge Rise M.D.   On: 06/26/2017 15:13   Ir Fluoro Guide Cv Line Right  Result Date: 06/25/2017 INDICATION: Poor venous access. In need of intravenous access for short-term chemotherapy administration. EXAM: ULTRASOUND AND FLUOROSCOPIC GUIDED PICC LINE INSERTION MEDICATIONS: None. CONTRAST:  None FLUOROSCOPY TIME:  12 seconds (8 mGy) COMPLICATIONS: None immediate. TECHNIQUE: The procedure, risks, benefits, and alternatives were explained to the patient and informed written consent was obtained. A timeout was performed prior to the initiation of the procedure. The right upper extremity was prepped with chlorhexidine in a sterile fashion, and a sterile drape was applied covering the operative field. Maximum barrier sterile technique with sterile gowns and gloves were used for the procedure. A timeout was performed prior to the initiation of the procedure. Local anesthesia was provided with 1% lidocaine. Under direct ultrasound guidance, the basilic vein was accessed with a micropuncture kit after the overlying soft tissues were anesthetized with 1% lidocaine. After the overlying soft tissues were anesthetized, a small venotomy incision was created and a micropuncture kit was  utilized to access the right basilic vein. Real-time ultrasound guidance was utilized for vascular access including the acquisition of a permanent ultrasound image documenting patency of the accessed vessel. A guidewire was advanced to the level of the superior caval-atrial junction for measurement purposes and the PICC line was cut to length. A peel-away sheath was placed and a 34 cm, 5 [place], single lumen was inserted to level of the superior caval-atrial junction. A post procedure spot fluoroscopic  was obtained. The catheter easily aspirated and flushed and was sutured in place. A dressing was placed. The patient tolerated the procedure well without immediate post procedural complication. FINDINGS: After catheter placement, the tip lies within the superior cavoatrial junction. The catheter aspirates and flushes normally and is ready for immediate use. IMPRESSION: Successful ultrasound and fluoroscopic guided placement of a right basilic vein approach, 34 cm, 5 French, single lumen PICC with tip at the superior caval-atrial junction. The PICC line is ready for immediate use. Electronically Signed   By: Sandi Mariscal M.D.   On: 06/25/2017 12:26   Ir US Guide Vasc Access Right  Result Date: 06/25/2017 INDICATION: Poor venous access. In need of intravenous access for short-term chemotherapy administration. EXAM: ULTRASOUND AND FLUOROSCOPIC GUIDED PICC LINE INSERTION MEDICATIONS: None. CONTRAST:  None FLUOROSCOPY TIME:  12 seconds (8 mGy) COMPLICATIONS: None immediate. TECHNIQUE: The procedure, risks, benefits, and alternatives were explained to the patient and informed written consent was obtained. A timeout was performed prior to the initiation of the procedure. The right upper extremity was prepped with chlorhexidine in a sterile fashion, and a sterile drape was applied covering the operative field. Maximum barrier sterile technique with sterile gowns and gloves were used for the procedure. A timeout was  performed prior to the initiation of the procedure. Local anesthesia was provided with 1% lidocaine. Under direct ultrasound guidance, the basilic vein was accessed with a micropuncture kit after the overlying soft tissues were anesthetized with 1% lidocaine. After the overlying soft tissues were anesthetized, a small venotomy incision was created and a micropuncture kit was utilized to access the right basilic vein. Real-time ultrasound guidance was utilized for vascular access including the acquisition of a permanent ultrasound image documenting patency of the accessed vessel. A guidewire was advanced to the level of the superior caval-atrial junction for measurement purposes and the PICC line was cut to length. A peel-away sheath was placed and a 34 cm, 5 [place], single lumen was inserted to level of the superior caval-atrial junction. A post procedure spot fluoroscopic was obtained. The catheter easily aspirated and flushed and was sutured in place. A dressing was placed. The patient tolerated the procedure well without immediate post procedural complication. FINDINGS: After catheter placement, the tip lies within the superior cavoatrial junction. The catheter aspirates and flushes normally and is ready for immediate use. IMPRESSION: Successful ultrasound and fluoroscopic guided placement of a right basilic vein approach, 34 cm, 5 French, single lumen PICC with tip at the superior caval-atrial junction. The PICC line is ready for immediate use. Electronically Signed   By: Sandi Mariscal M.D.   On: 06/25/2017 12:26   Dg Chest Port 1 View  Result Date: 06/26/2017 CLINICAL DATA:  83 year old female with hypoxia. EXAM: PORTABLE CHEST 1 VIEW COMPARISON:  01/21/2017 and earlier. FINDINGS: Portable AP semi upright view at 1143 hours. Lower lung volumes. Stable cardiac size and mediastinal contours. Right side PICC line in place today. Chronic thoracic spinal stimulator device and lumbar fusion. Increased pulmonary  vascularity and/or interstitial markings are similar to May, when chest CT demonstrated abnormal ground-glass opacity and septal thickening. No pneumothorax, pleural effusion, or consolidation. Advanced degenerative changes at both shoulders. Negative visible bowel gas pattern. IMPRESSION: Lower lung volumes. Increased pulmonary interstitial opacity similar to May, suspect acute interstitial edema. Viral/atypical respiratory infection fell less likely. Electronically Signed   By: Genevie Ann M.D.   On: 06/26/2017 12:27    Procedures Procedures (including critical care time)  Medications Ordered  in ED Medications  ceFEPIme (MAXIPIME) 2 g in dextrose 5 % 50 mL IVPB (not administered)  iopamidol (ISOVUE-370) 76 % injection 100 mL (100 mLs Intravenous Contrast Given 06/26/17 1434)  naloxone (NARCAN) injection 1 mg (1 mg Intravenous Given 06/26/17 1557)     Initial Impression / Assessment and Plan / ED Course  I have reviewed the triage vital signs and the nursing notes.  Pertinent labs & imaging results that were available during my care of the patient were reviewed by me and considered in my medical decision making (see chart for details).     Workup essentially negative for cause for the patient's altered mental status. She does have evidence of pulmonary edema does not appear to be in any respiratory distress and is maintaining saturations on her normal supplement oxygen. Patient's been seen in the past for similar presentation. Thought to be due to polypharmacy. Will give a dose of Narcan to check for response.  Patient with improved heart rate and blood pressure after Narcan. Making nonsensical statements. Urine is cloudy. Sent for culture. Suspect UTI. Though UDS screen is negative patient is on OxyContin which is routinely not detected on UDS. This combined with her urinary tract infection likely the cause of her altered mental status. Will discuss with hospitalist and transfer for  admission.  Dr. Maudie Mercury will accept in transfer to [facility] long telemetry bed. Final Clinical Impressions(s) / ED Diagnoses   Final diagnoses:  Opiate overdose, accidental or unintentional, initial encounter ([place])  Acute lower UTI  Systolic congestive heart failure, unspecified HF chronicity ([place])    New Prescriptions New Prescriptions   No medications on file     Julianne Rice, MD 06/26/17 1628

## 2017-06-26 NOTE — ED Notes (Signed)
ED Provider at bedside. 

## 2017-06-26 NOTE — Progress Notes (Signed)
Pharmacy Antibiotic Note  [name] [name] is a 83 y.o. female with chronic ITP presented to [facility] on 06/26/17 for her first treatment of rituxan, but was found to be lethargic.  Patient was then sent to the ED for further workup.  To start cefepime for suspected UTI.  - of note, patient has keflex listed in allergy section with unknown rxn, but she received cefepime dose at 1645 with no rxns noted (per her RN).  - 10/10 UA: large leuk, nitrite pos - scr 1.4 (crcl~31)    Plan: - cefepime 2gm IV q24h - f/u renal function and culture - informed RN to monitor pt closely for any allergic rxns  _______________________________________  Weight: 197 lb (89.4 kg)  Temp (24hrs), Avg:97.9 F (36.6 C), Min:97.5 F (36.4 C), Max:98.2 F (36.8 C)   Recent Labs Lab 06/26/17 1005  WBC 9.4  CREATININE 1.4*    Estimated Creatinine Clearance: 31.7 mL/min (A) (by C-G formula based on SCr of 1.4 mg/dL (H)).    Allergies  Allergen Reactions  . Keflex [Cephalexin] Other (See Comments)    Reaction:  Unknown     Thank you for allowing pharmacy to be a part of this patient's care.  Lynelle Doctor 06/26/2017 7:30 PM

## 2017-06-26 NOTE — ED Notes (Signed)
L radial Artery x 1 attempt for ABG. Pt is on 2lpm [place] in which patient wears at home.

## 2017-06-26 NOTE — Progress Notes (Addendum)
Patient arrived from [facility], placed on telemetry and vitals done.  Pt appears stable at this time.  [facility] Admissions informed of patient's arrival. Report given to night RN.

## 2017-06-27 DIAGNOSIS — I5020 Unspecified systolic (congestive) heart failure: Secondary | ICD-10-CM

## 2017-06-27 LAB — HEPATITIS PANEL, ACUTE
HBsAg Screen: NEGATIVE
HEP A IGM: NEGATIVE
HEP B C IGM: NEGATIVE
Hep C Virus Ab: <0.1 s/co ratio (ref 0.0–0.9)

## 2017-06-27 LAB — BASIC METABOLIC PANEL
Anion gap: 12 (ref 5–15)
BUN: 28 mg/dL — AB (ref 6–20)
CO2: 28 mmol/L (ref 22–32)
Calcium: 8.3 mg/dL — ABNORMAL LOW (ref 8.9–10.3)
Chloride: 103 mmol/L (ref 101–111)
Creatinine, Ser: 1.28 mg/dL — ABNORMAL HIGH (ref 0.44–1.00)
GFR calc Af Amer: 43 mL/min — ABNORMAL LOW (ref >60)
GFR, EST NON AFRICAN AMERICAN: 37 mL/min — AB (ref >60)
GLUCOSE: 81 mg/dL (ref 65–99)
POTASSIUM: 3.5 mmol/L (ref 3.5–5.1)
Sodium: 143 mmol/L (ref 135–145)

## 2017-06-27 LAB — CBC
HCT: 37.6 % (ref 36.0–46.0)
HEMATOCRIT: 35.8 % — AB (ref 36.0–46.0)
Hemoglobin: 10.9 g/dL — ABNORMAL LOW (ref 12.0–15.0)
Hemoglobin: 11.3 g/dL — ABNORMAL LOW (ref 12.0–15.0)
MCH: 26.3 pg (ref 26.0–34.0)
MCH: 26 pg (ref 26.0–34.0)
MCHC: 30.4 g/dL (ref 30.0–36.0)
MCHC: 30.1 g/dL (ref 30.0–36.0)
MCV: 86.3 fL (ref 78.0–100.0)
MCV: 86.6 fL (ref 78.0–100.0)
PLATELETS: 29 10*3/uL — AB (ref 150–400)
Platelets: 15 10*3/uL — CL (ref 150–400)
RBC: 4.15 MIL/uL (ref 3.87–5.11)
RBC: 4.34 MIL/uL (ref 3.87–5.11)
RDW: 16.7 % — AB (ref 11.5–15.5)
RDW: 16.8 % — AB (ref 11.5–15.5)
WBC: 7.8 10*3/uL (ref 4.0–10.5)
WBC: 8.9 10*3/uL (ref 4.0–10.5)

## 2017-06-27 LAB — GLUCOSE, CAPILLARY
GLUCOSE-CAPILLARY: 145 mg/dL — AB (ref 65–99)
GLUCOSE-CAPILLARY: 117 mg/dL — AB (ref 65–99)
GLUCOSE-CAPILLARY: 112 mg/dL — AB (ref 65–99)
Glucose-Capillary: 74 mg/dL (ref 65–99)

## 2017-06-27 LAB — URINE CULTURE

## 2017-06-27 LAB — MRSA PCR SCREENING: MRSA BY PCR: POSITIVE — AB

## 2017-06-27 MED ORDER — CHLORHEXIDINE GLUCONATE CLOTH 2 % EX PADS
6.0000 | MEDICATED_PAD | Freq: Every day | CUTANEOUS | Status: AC
  Administered 2017-06-27 – 2017-07-01 (×4): 6 via TOPICAL

## 2017-06-27 MED ORDER — ORAL CARE MOUTH RINSE
15.0000 mL | Freq: Two times a day (BID) | OROMUCOSAL | Status: DC
  Administered 2017-06-27 – 2017-07-04 (×13): 15 mL via OROMUCOSAL

## 2017-06-27 MED ORDER — MUPIROCIN 2 % EX OINT
1.0000 "application " | TOPICAL_OINTMENT | Freq: Two times a day (BID) | CUTANEOUS | Status: AC
  Administered 2017-06-27 – 2017-07-01 (×10): 1 via NASAL
  Filled 2017-06-27 (×3): qty 22

## 2017-06-27 NOTE — Progress Notes (Signed)
CRITICAL VALUE ALERT  Critical Value:  Platelets 15  Date & Time Notied:  When labs resulted  Provider Notified: Tylene Fantasia NP  Orders Received/Actions taken:

## 2017-06-27 NOTE — Progress Notes (Signed)
PROGRESS NOTE    ODETTA [name]  UYQ:[id number] DOB: 01/20/1931 DOA: 06/26/2017 PCP: [name] Manes, MD   Chief Complaint  Patient presents with  . Fatigue    Brief Narrative:  [name] [name] is a 83 y.o. female with medical history significant for paroxysmal atrial fibrillation not on anticoagulation, coronary artery disease with history of stents, chronic diastolic CHF, hypertension,and chronic ITP, no presenting to the emergency department for evaluation of altered mental status. She had presented to the [facility] for first cycle of Rituxan, but was found to be very fatigued and confused. She was hypoxic with saturation of 79% on room air, with caregiver reporting that they forgot to bring her oxygen tank. She was treated with supplemental oxygen with improvement in her saturations, but she continued to be altered and lethargic and was directed to the ED for further evaluation. She had not been reporting any specific complaints, there is no recent fall or trauma, and no new medications.   Admitted for urinary tract infection/acute encephalopathy. Assessment & Plan   Acute encephalopathy, metabolic -patient presented with lethargy and confusion -CT head showed chronic microvascular ischemia without acute renal normality -CTA chest negative for PE -Patient was given some Narcan and became more alert on admission. -Workup revealed UTI -Mental status currently improving  Urinary tract infection -UA showed few bacteria, TNTC WBC, large leukocytes, positive nitrites -urine culture pending -continue cefepime (patient with Keflex allergy in the past)  Acute on chronic diastolic CHF -Patient noted to have pulmonary edema on imaging and mild crackles on exam but no increased work of breathing -CTA chest showed extensive groundglass attenuation of the lungs, worrisome for CHF -echocardiogram 11/18/2016 shows EF of 65-70%. Wall motion was normal, no regional wall motion abnormalities.  Study technically not sufficient to allow evaluation of LV diastolic dysfunction. -Continue torsemide -monitor intake and output, daily weights  ITP -patient being followed by oncology -Was start Rituxan on 06/26/2017 however was sent to the emergency department -Oncology's been added to patient's treatment team -continue monitor CBC  Chronic kidney disease, stage III -creatinine appears to be stable, continue to monitor BMP  Essential hypertension -Patient with mildly low blood pressure, metoprolol held, continue to monitor closely  Proximal atrial fibrillation -appears to be in sinus rhythm, patient currently not on anticoagulation due to thrombocytopenia from ITP -Currently rate controlled, continue digoxin and diltiazem -CHASVASC 6  Diabetes mellitus, type II -continue insulin sliding scale and CBG monitoring  Chronic pain -Continue Ultram for pain control  DVT Prophylaxis  SCDs  Code Status: DNR  Family Communication: None at bedside  Disposition Plan: Admitted   Consultants None  Procedures  None  Antibiotics   Anti-infectives    Start     Dose/Rate Route Frequency Ordered Stop   06/27/17 1700  ceFEPIme (MAXIPIME) 2 g in dextrose 5 % 50 mL IVPB     2 g 100 mL/hr over 30 Minutes Intravenous Every 24 hours 06/26/17 1944     06/27/17 1000  valACYclovir (VALTREX) tablet 1,000 mg     1,000 mg Oral Daily 06/26/17 1928     06/26/17 1641  ceFEPIme (MAXIPIME) 2 g injection    Comments:  Ilda Basset   : cabinet override      06/26/17 1641 06/26/17 1722   06/26/17 1630  ceFEPIme (MAXIPIME) 2 g in dextrose 5 % 50 mL IVPB     2 g 100 mL/hr over 30 Minutes Intravenous  Once 06/26/17 1621 06/26/17 1705  Subjective:   [name] [name] seen and examined today.  Patient denies any shortness of breath, chest pain, abdominal pain, nausea, vomiting, headache, dizziness.   Objective:   Vitals:   06/26/17 1753 06/26/17 1901 06/26/17 2122 06/27/17 0501  BP:  (!)  108/46 (!) 113/50 (!) 116/48  Pulse:  67 78 91  Resp:  (!) _0 Temp: (!) 97.5 F (36.4 C) 98.2 F (36.8 C) 97.9 F (36.6 C) 98.2 F (36.8 C)  TempSrc: Oral Oral Oral Oral  SpO2:  97% 98% 94%  Weight:    88.6 kg (195 lb 5.2 oz)  Height:   _1  (1.626 m)     Intake/Output Summary (Last 24 hours) at 06/27/17 1241 Last data filed at 06/27/17 0913  Gross per 24 hour  Intake             1100 ml  Output              250 ml  Net              850 ml   Filed Weights   06/26/17 1137 06/27/17 0501  Weight: 89.4 kg (197 lb) 88.6 kg (195 lb 5.2 oz)    Exam  General: Well developed, well nourished, NAD, appears stated age  HEENT: NCAT,  mucous membranes moist.   Cardiovascular: S1 S2 auscultated, RRR, no murmurs  Respiratory: Clear to auscultation bilaterally with equal chest rise  Abdomen: Soft, nontender, nondistended, + bowel sounds  Extremities: warm dry without cyanosis clubbing or edema  Neuro: AAOx3, nonfocal   Psych: Normal affect and demeanor, pleasant    Data Reviewed: I have personally reviewed following labs and imaging studies  CBC:  Recent Labs Lab 06/26/17 1005 06/27/17 0406  WBC 9.4 7.8  NEUTROABS 7.0*  --   HGB 12.2 10.9*  HCT 40.2 35.8*  MCV 87 86.3  PLT 21 Platelet count consistent in citrate* 15*   Basic Metabolic Panel:  Recent Labs Lab 06/26/17 1005 06/27/17 0406  NA 143 143  K 3.7 3.5  CL 104 103  CO2 30 28  GLUCOSE 159* 81  BUN 29* 28*  CREATININE 1.4* 1.28*  CALCIUM 9.2 8.3*   GFR: Estimated Creatinine Clearance: 35.3 mL/min (A) (by C-G formula based on SCr of 1.28 mg/dL (H)). Liver Function Tests:  Recent Labs Lab 06/26/17 1005  AST 14  ALT 13  ALKPHOS 99*  BILITOT 0.70  PROT 6.1*  ALBUMIN 2.4*   No results for input(s): LIPASE, AMYLASE in the last 168 hours. No results for input(s): AMMONIA in the last 168 hours. Coagulation Profile: No results for input(s): INR, PROTIME in the last 168 hours. Cardiac  Enzymes:  Recent Labs Lab 06/26/17 1308  TROPONINI <0.03   BNP (last 3 results) No results for input(s): PROBNP in the last 8760 hours. HbA1C: No results for input(s): HGBA1C in the last 72 hours. CBG:  Recent Labs Lab 06/26/17 2226 06/27/17 0749  GLUCAP 129* 74   Lipid Profile: No results for input(s): CHOL, HDL, LDLCALC, TRIG, CHOLHDL, LDLDIRECT in the last 72 hours. Thyroid Function Tests: No results for input(s): TSH, T4TOTAL, FREET4, T3FREE, THYROIDAB in the last 72 hours. Anemia Panel: No results for input(s): VITAMINB12, FOLATE, FERRITIN, TIBC, IRON, RETICCTPCT in the last 72 hours. Urine analysis:    Component Value Date/Time   COLORURINE YELLOW 06/26/2017 1400   APPEARANCEUR CLOUDY (A) 06/26/2017 1400   LABSPEC 1.010 06/26/2017 1400   PHURINE 6.0 06/26/2017 1400   GLUCOSEU  NEGATIVE 06/26/2017 1400   HGBUR LARGE (A) 06/26/2017 1400   BILIRUBINUR NEGATIVE 06/26/2017 1400   KETONESUR NEGATIVE 06/26/2017 1400   PROTEINUR NEGATIVE 06/26/2017 1400   UROBILINOGEN 0.2 08/10/2013 0037   NITRITE POSITIVE (A) 06/26/2017 1400   LEUKOCYTESUR LARGE (A) 06/26/2017 1400   Sepsis Labs: _0 (procalcitonin:4,lacticidven:4)  ) Recent Results (from the past 240 hour(s))  MRSA PCR Screening     Status: Abnormal   Collection Time: 06/26/17  9:14 PM  Result Value Ref Range Status   MRSA by PCR POSITIVE (A) NEGATIVE Final    Comment:        The GeneXpert MRSA Assay (FDA approved for NASAL specimens only), is one component of a comprehensive MRSA colonization surveillance program. It is not intended to diagnose MRSA infection nor to guide or monitor treatment for MRSA infections. RESULT CALLED TO, READ BACK BY AND VERIFIED WITH: Levada Schilling [id number] @ [place]       Radiology Studies: Ct Head Wo Contrast  Result Date: 06/26/2017 CLINICAL DATA:  Altered mental status EXAM: CT HEAD WITHOUT CONTRAST TECHNIQUE: Contiguous axial images were  obtained from the base of the skull through the vertex without intravenous contrast. COMPARISON:  Head CT 01/18/2017 FINDINGS: Brain: No mass lesion, intraparenchymal hemorrhage or extra-axial collection. No evidence of acute cortical infarct. There is periventricular hypoattenuation compatible with chronic microvascular disease. Vascular: No hyperdense vessel or unexpected calcification. Skull: Normal visualized skull base, calvarium and extracranial soft tissues. Sinuses/Orbits: No sinus fluid levels or advanced mucosal thickening. No mastoid effusion. Normal orbits. IMPRESSION: Chronic microvascular ischemia without acute intracranial abnormality. Electronically Signed   By: Ulyses Jarred M.D.   On: 06/26/2017 15:06   Ct Angio Chest Pe W And/or Wo Contrast  Result Date: 06/26/2017 CLINICAL DATA:  Altered level of consciousness and decreased oxygen saturation today. EXAM: CT ANGIOGRAPHY CHEST WITH CONTRAST TECHNIQUE: Multidetector CT imaging of the chest was performed using the standard protocol during bolus administration of intravenous contrast. Multiplanar CT image reconstructions and MIPs were obtained to evaluate the vascular anatomy. CONTRAST:  100 cc Isovue 370. COMPARISON:  CT chest 01/19/2017. Single-view of the chest earlier today. FINDINGS: Cardiovascular: No pulmonary embolus is identified. There is calcific aortic and coronary atherosclerosis. Cardiomegaly is seen. No pericardial effusion. Contrast refluxes into the inferior vena cava and hepatic veins compatible with right heart insufficiency. Mediastinum/Nodes: No enlarged mediastinal, hilar, or axillary lymph nodes. Thyroid gland, trachea, and esophagus demonstrate no significant findings. Lungs/Pleura: No pleural effusion. As on the prior CT, the lungs demonstrate extensive ground-glass attenuation with an upper lobe predominance. No nodule or mass is identified. Upper Abdomen: No acute abnormality. Musculoskeletal: Spinal stimulator is in  place. There is exaggeration of the normal thoracic kyphosis. Multilevel degenerative disc disease is identified. No fracture. No lytic or sclerotic lesion. Review of the MIP images confirms the above findings. IMPRESSION: Negative for pulmonary embolus. Extensive ground-glass attenuation in the lungs is most worrisome for congestive heart failure given marked cardiomegaly. Aortic Atherosclerosis (ICD10-I70.0). Electronically Signed   By: Inge Rise M.D.   On: 06/26/2017 15:13   Dg Chest Port 1 View  Result Date: 06/26/2017 CLINICAL DATA:  83 year old female with hypoxia. EXAM: PORTABLE CHEST 1 VIEW COMPARISON:  01/21/2017 and earlier. FINDINGS: Portable AP semi upright view at 1143 hours. Lower lung volumes. Stable cardiac size and mediastinal contours. Right side PICC line in place today. Chronic thoracic spinal stimulator device and lumbar fusion. Increased pulmonary vascularity and/or interstitial markings are similar to May,  when chest CT demonstrated abnormal ground-glass opacity and septal thickening. No pneumothorax, pleural effusion, or consolidation. Advanced degenerative changes at both shoulders. Negative visible bowel gas pattern. IMPRESSION: Lower lung volumes. Increased pulmonary interstitial opacity similar to May, suspect acute interstitial edema. Viral/atypical respiratory infection fell less likely. Electronically Signed   By: Genevie Ann M.D.   On: 06/26/2017 12:27     Scheduled Meds: . acidophilus  1 capsule Oral q1800  . calcium-vitamin D  2 tablet Oral Daily  . Chlorhexidine Gluconate Cloth  6 each Topical Q0600  . digoxin  0.0625 mg Oral Daily  . diltiazem  60 mg Oral Q6H  . gabapentin  300 mg Oral BID  . insulin aspart  0-5 Units Subcutaneous QHS  . insulin aspart  0-9 Units Subcutaneous TID WC  . mouth rinse  15 mL Mouth Rinse BID  . mupirocin ointment  1 application Nasal BID  . pantoprazole  40 mg Oral Daily  . polyvinyl alcohol  2 drop Both Eyes BID  . potassium  chloride SA  20 mEq Oral BID  . sodium chloride  1 spray Each Nare TID  . sodium chloride flush  3 mL Intravenous Q12H  . sodium chloride flush  3 mL Intravenous Q12H  . torsemide  20 mg Oral Daily  . valACYclovir  1,000 mg Oral Daily   Continuous Infusions: . sodium chloride    . ceFEPime (MAXIPIME) IV       LOS: 1 day   Time Spent in minutes   30 minutes  Josephus Harriger D.O. on 06/27/2017 at 12:41 PM  Between 7am to 7pm - Pager - [phone]  After 7pm go to [email or web address] - password [email or web address]  And look for the night coverage person covering for me after hours  [facility] Office  [phone]

## 2017-06-27 NOTE — Consult Note (Addendum)
[facility] Nurse wound consult note Reason for Consult: Consult requested for BLE.  Pt has worn Una boots in the past, according to the EMR, but she states these were discontinued "awhile ago." Wound type: Several patchy areas of partial thickness wounds;  Measurement: Right anterior calf 9X3X.1cm, pink dry healing partial thickness wound, no odor or drainage. Left anterior calf 1X1X.1cm, 2X1X.1cm,pink dry healing partial thickness wound, no odor or drainage. There is a full thickness puncture wound; .2X.2X.2cm in an "L" shape, red dry woundbed, no odor or drainage.  Dressing procedure/placement/frequency: Foam dressings to protect and promote healing.  Float heels to reduce pressure. Please re-consult if further assistance is needed.  Thank-you,  Julien Girt MSN, [place], [place], [place], [place]

## 2017-06-28 DIAGNOSIS — D693 Immune thrombocytopenic purpura: Secondary | ICD-10-CM

## 2017-06-28 DIAGNOSIS — Z66 Do not resuscitate: Secondary | ICD-10-CM

## 2017-06-28 LAB — BASIC METABOLIC PANEL
ANION GAP: 12 (ref 5–15)
BUN: 24 mg/dL — AB (ref 6–20)
CALCIUM: 8.5 mg/dL — AB (ref 8.9–10.3)
CO2: 28 mmol/L (ref 22–32)
Chloride: 102 mmol/L (ref 101–111)
Creatinine, Ser: 1.23 mg/dL — ABNORMAL HIGH (ref 0.44–1.00)
GFR calc Af Amer: 45 mL/min — ABNORMAL LOW (ref >60)
GFR, EST NON AFRICAN AMERICAN: 39 mL/min — AB (ref >60)
GLUCOSE: 92 mg/dL (ref 65–99)
POTASSIUM: 3.3 mmol/L — AB (ref 3.5–5.1)
SODIUM: 142 mmol/L (ref 135–145)

## 2017-06-28 LAB — GLUCOSE, CAPILLARY
GLUCOSE-CAPILLARY: 82 mg/dL (ref 65–99)
GLUCOSE-CAPILLARY: 124 mg/dL — AB (ref 65–99)
GLUCOSE-CAPILLARY: 132 mg/dL — AB (ref 65–99)
GLUCOSE-CAPILLARY: 125 mg/dL — AB (ref 65–99)

## 2017-06-28 NOTE — Progress Notes (Signed)
PROGRESS NOTE    [name] [name]  LAG:[id number] DOB: July 29, 1931 DOA: 06/26/2017 PCP: Lajean Manes, MD   Brief Narrative:83 y.o.femalewith medical history significant for paroxysmal atrial fibrillation not on anticoagulation, coronary artery disease with history of stents, chronic diastolic CHF, hypertension,and chronic ITP, no presenting to the emergency department for evaluation of altered mental status. She had presented to the [facility] for first cycle of Rituxan, but was found to be very fatigued and confused. She was hypoxic with saturation of 79% on room air, with caregiver reporting that they forgot to bring her oxygen tank. She was treated with supplemental oxygen with improvement in her saturations, but she continued to be altered and lethargic and was directed to ed.   Assessment & Plan:   Principal Problem:   Acute encephalopathy Active Problems:   HTN (hypertension)   Thrombocytopenia (HCC)   CAD (coronary artery disease) of artery bypass graft   Chronic narcotic dependence (HCC)   Chronic ITP (idiopathic thrombocytopenia) (HCC)   Acute on chronic diastolic CHF (congestive heart failure) (HCC)   CKD (chronic kidney disease), stage III (HCC)   Chronic respiratory failure with hypoxia (HCC)   Acute lower UTI  Acute encephalopathy, metabolic -patient presented with lethargy and confusion -CT head showed chronic microvascular ischemia without acute renal normality -CTA chest negative for PE -Patient was given some Narcan and became more alert on admission. -Workup revealed UTI -Mental status currently improving  Urinary tract infection -UA showed few bacteria, TNTC WBC, large leukocytes, positive nitrites -urine culture pending -continue cefepime (patient with Keflex allergy in the past)  Acute on chronic diastolic CHF -Patient noted to have pulmonary edema on imaging and mild crackles on exam but no increased work of breathing -CTA chest showed extensive  groundglass attenuation of the lungs, worrisome for CHF -echocardiogram 11/18/2016 shows EF of 65-70%. Wall motion was normal, no regional wall motion abnormalities. Study technically not sufficient to allow evaluation of LV diastolic dysfunction. -Continue torsemide -monitor intake and output, daily weights  ITP -Appreciate oncology input. Trial of steroids to be started today. -Was start Rituxan on 06/26/2017 however was sent to the emergency department -continue monitor CBC  Chronic kidney disease, stage III -creatinine appears to be stable, continue to monitor BMP  Essential hypertension -Patient with mildly low blood pressure, metoprolol held, continue to monitor closely   Proximal atrial fibrillation -appears to be in sinus rhythm, patient currently not on anticoagulation due to thrombocytopenia from ITP -Currently rate controlled, continue digoxin and diltiazem   Diabetes mellitus, type II -continue insulin sliding scale and CBG monitoring  Chronic pain -Continue Ultram for pain control  DVT Prophylaxis  SCD Code Status: DO NOT RESUSCITATE Family Communication: None Disposition Plan: To be determined  Consultants:  Oncology  Procedures: None  Antimicrobials:  Cefepime  Subjective: Resting in bed oxygen on did not respond appropriately to my questions. She appears very tired   Objective: Vitals:   06/27/17 1242 06/28/17 0525 06/28/17 0557 06/28/17 1328  BP: (!) 141/88 138/65  108/73  Pulse: 80 (!) 108  (!) 112  Resp: _0 Temp: 98.7 F (37.1 C) 98.5 F (36.9 C)  99 F (37.2 C)  TempSrc: Oral Oral  Oral  SpO2: 97% 93%  90%  Weight:   87.9 kg (193 lb 12.6 oz)   Height:        Intake/Output Summary (Last 24 hours) at 06/28/17 1432 Last data filed at 06/28/17 0914  Gross per 24 hour  Intake  120 ml  Output             1300 ml  Net            -1180 ml   Filed Weights   06/26/17 1137 06/27/17 0501 06/28/17 0557  Weight:  89.4 kg (197 lb) 88.6 kg (195 lb 5.2 oz) 87.9 kg (193 lb 12.6 oz)    Examination:  General exam: Appears calm and comfortable  Respiratory system: Decreased breath sounds at the bases. Respiratory effort normal. Cardiovascular system: Decreased sounds bilateral bases.one plus pedal edema. Gastrointestinal system: Abdomen is nondistended, soft and nontender. No organomegaly or masses felt. Normal bowel sounds heard. Central nervous system: Alert and oriented. No focal neurological deficits. Extremities: Symmetric 5 x 5 power. Skin: No rashes, lesions or ulcers Psychiatry: Judgement and insight appear normal. Mood & affect appropriate.     Data Reviewed: I have personally reviewed following labs and imaging studies  CBC:  Recent Labs Lab 06/26/17 1005 06/27/17 0406 06/27/17 1447  WBC 9.4 7.8 8.9  NEUTROABS 7.0*  --   --   HGB 12.2 10.9* 11.3*  HCT 40.2 35.8* 37.6  MCV 87 86.3 86.6  PLT 21 Platelet count consistent in citrate* 15* 29*   Basic Metabolic Panel:  Recent Labs Lab 06/26/17 1005 06/27/17 0406 06/28/17 0329  NA 143 143 142  K 3.7 3.5 3.3*  CL 104 103 102  CO2 _0 GLUCOSE 159* 81 92  BUN 29* 28* 24*  CREATININE 1.4* 1.28* 1.23*  CALCIUM 9.2 8.3* 8.5*   GFR: Estimated Creatinine Clearance: 36.5 mL/min (A) (by C-G formula based on SCr of 1.23 mg/dL (H)). Liver Function Tests:  Recent Labs Lab 06/26/17 1005  AST 14  ALT 13  ALKPHOS 99*  BILITOT 0.70  PROT 6.1*  ALBUMIN 2.4*   No results for input(s): LIPASE, AMYLASE in the last 168 hours. No results for input(s): AMMONIA in the last 168 hours. Coagulation Profile: No results for input(s): INR, PROTIME in the last 168 hours. Cardiac Enzymes:  Recent Labs Lab 06/26/17 1308  TROPONINI <0.03   BNP (last 3 results) No results for input(s): PROBNP in the last 8760 hours. HbA1C: No results for input(s): HGBA1C in the last 72 hours. CBG:  Recent Labs Lab 06/27/17 1242 06/27/17 1800  06/27/17 2306 06/28/17 0730 06/28/17 1133  GLUCAP 145* 117* 112* 82 124*   Lipid Profile: No results for input(s): CHOL, HDL, LDLCALC, TRIG, CHOLHDL, LDLDIRECT in the last 72 hours. Thyroid Function Tests: No results for input(s): TSH, T4TOTAL, FREET4, T3FREE, THYROIDAB in the last 72 hours. Anemia Panel: No results for input(s): VITAMINB12, FOLATE, FERRITIN, TIBC, IRON, RETICCTPCT in the last 72 hours. Sepsis Labs: No results for input(s): PROCALCITON, LATICACIDVEN in the last 168 hours.  Recent Results (from the past 240 hour(s))  Urine culture     Status: Abnormal   Collection Time: 06/26/17  2:00 PM  Result Value Ref Range Status   Specimen Description URINE, RANDOM  Final   Special Requests NONE  Final   Culture MULTIPLE SPECIES PRESENT, SUGGEST RECOLLECTION (A)  Final   Report Status 06/27/2017 FINAL  Final  MRSA PCR Screening     Status: Abnormal   Collection Time: 06/26/17  9:14 PM  Result Value Ref Range Status   MRSA by PCR POSITIVE (A) NEGATIVE Final    Comment:        The GeneXpert MRSA Assay (FDA approved for NASAL specimens only), is one component of a comprehensive  MRSA colonization surveillance program. It is not intended to diagnose MRSA infection nor to guide or monitor treatment for MRSA infections. RESULT CALLED TO, READ BACK BY AND VERIFIED WITH: Levada Schilling [id number] @ [place]          Radiology Studies: Ct Head Wo Contrast  Result Date: 06/26/2017 CLINICAL DATA:  Altered mental status EXAM: CT HEAD WITHOUT CONTRAST TECHNIQUE: Contiguous axial images were obtained from the base of the skull through the vertex without intravenous contrast. COMPARISON:  Head CT 01/18/2017 FINDINGS: Brain: No mass lesion, intraparenchymal hemorrhage or extra-axial collection. No evidence of acute cortical infarct. There is periventricular hypoattenuation compatible with chronic microvascular disease. Vascular: No hyperdense vessel or unexpected  calcification. Skull: Normal visualized skull base, calvarium and extracranial soft tissues. Sinuses/Orbits: No sinus fluid levels or advanced mucosal thickening. No mastoid effusion. Normal orbits. IMPRESSION: Chronic microvascular ischemia without acute intracranial abnormality. Electronically Signed   By: Ulyses Jarred M.D.   On: 06/26/2017 15:06   Ct Angio Chest Pe W And/or Wo Contrast  Result Date: 06/26/2017 CLINICAL DATA:  Altered level of consciousness and decreased oxygen saturation today. EXAM: CT ANGIOGRAPHY CHEST WITH CONTRAST TECHNIQUE: Multidetector CT imaging of the chest was performed using the standard protocol during bolus administration of intravenous contrast. Multiplanar CT image reconstructions and MIPs were obtained to evaluate the vascular anatomy. CONTRAST:  100 cc Isovue 370. COMPARISON:  CT chest 01/19/2017. Single-view of the chest earlier today. FINDINGS: Cardiovascular: No pulmonary embolus is identified. There is calcific aortic and coronary atherosclerosis. Cardiomegaly is seen. No pericardial effusion. Contrast refluxes into the inferior vena cava and hepatic veins compatible with right heart insufficiency. Mediastinum/Nodes: No enlarged mediastinal, hilar, or axillary lymph nodes. Thyroid gland, trachea, and esophagus demonstrate no significant findings. Lungs/Pleura: No pleural effusion. As on the prior CT, the lungs demonstrate extensive ground-glass attenuation with an upper lobe predominance. No nodule or mass is identified. Upper Abdomen: No acute abnormality. Musculoskeletal: Spinal stimulator is in place. There is exaggeration of the normal thoracic kyphosis. Multilevel degenerative disc disease is identified. No fracture. No lytic or sclerotic lesion. Review of the MIP images confirms the above findings. IMPRESSION: Negative for pulmonary embolus. Extensive ground-glass attenuation in the lungs is most worrisome for congestive heart failure given marked cardiomegaly.  Aortic Atherosclerosis (ICD10-I70.0). Electronically Signed   By: Inge Rise M.D.   On: 06/26/2017 15:13        Scheduled Meds: . acidophilus  1 capsule Oral q1800  . calcium-vitamin D  2 tablet Oral Daily  . Chlorhexidine Gluconate Cloth  6 each Topical Q0600  . digoxin  0.0625 mg Oral Daily  . diltiazem  60 mg Oral Q6H  . gabapentin  300 mg Oral BID  . insulin aspart  0-5 Units Subcutaneous QHS  . insulin aspart  0-9 Units Subcutaneous TID WC  . mouth rinse  15 mL Mouth Rinse BID  . mupirocin ointment  1 application Nasal BID  . pantoprazole  40 mg Oral Daily  . polyvinyl alcohol  2 drop Both Eyes BID  . potassium chloride SA  20 mEq Oral BID  . sodium chloride  1 spray Each Nare TID  . sodium chloride flush  3 mL Intravenous Q12H  . sodium chloride flush  3 mL Intravenous Q12H  . torsemide  20 mg Oral Daily  . valACYclovir  1,000 mg Oral Daily   Continuous Infusions: . sodium chloride    . ceFEPime (MAXIPIME) IV Stopped (06/27/17 1730)  LOS: 2 days     Georgette Shell, MD [facility]  If 7PM-7AM, please contact night-coverage [email or web address] Password [email or web address] 06/28/2017, 2:32 PM

## 2017-06-28 NOTE — Clinical Social Work Note (Addendum)
Clinical Social Work Assessment  Patient Details  Name: [name] [name] MRN: [id number] Date of Birth: 04-08-1931  Date of referral:  06/28/17               Reason for consult:   (pt admitted from facility)                Permission sought to share information with:  Family Supports, Chartered certified accountant granted to share information::  Yes, Verbal Permission Granted  Name::     son IT consultant::  [employer]   Relationship::     Contact Information:     Housing/Transportation Living arrangements for the past 2 months:  [place] of Information:  Patient, Facility Patient Interpreter Needed:  None Criminal Activity/Legal Involvement Pertinent to Current Situation/Hospitalization:  No - Comment as needed Significant Relationships:  Adult Children, Warehouse manager Lives with:  Facility Resident Do you feel safe going back to the place where you live?  Yes Need for family participation in patient care:  No (Coment)  Care giving concerns:  Pt from [facility] where she resides as LTC resident. No caregiving concerns reported.    Social Worker assessment / plan:  CSW consulted as pt admitted from facility- [facility]. Is [place] resident. Met with pt at bedside. She was alert/oriented. Eating meal. Answered CSW questions appropriately but required some prompting as she was slow to respond, seemingly processing inquiries.  Confirms she lives at [facility], unable to say how long she has been there. "It's been quite some time." Chat review indicates pt has been resident there 20+ years. States, "I don't think I can walk anymore at all, I haven't tried in some time. I mostly sit in my chair there and read, they help me to my chair." Reports plan to return at [place]. Advises CSW can communicate with her son- attempted to but no voicemail option at number on file. Pt states, "He's not around much though." Chart review indicates pt's main  contact in the past has been a friend, pt states she does not need anyone else contacted this encounter. Made contact with facility and advised of pt's plan to return.  Updated FL2.  Plan: Return to [facility] SN[facility] at [place].   Employment status:  Retired Health visitor, Medicaid In [place] PT Recommendations:  Not assessed at this time [place] / Referral to community resources:     Patient/Family's Response to care:  Does not remark on care other than "ready to go home"  Patient/Family's Understanding of and Emotional Response to Diagnosis, Current Treatment, and Prognosis:  Pt demonstrates some understanding of her treatment and plan but has delayed response and sometimes inappropriate responses to questioning per staff. Emotionally fairly flat but pleasant  Emotional Assessment Appearance:  Appears stated age Attitude/Demeanor/Rapport:   (pleasant, cooperative) Affect (typically observed):  Calm (slow to respond) Orientation:  Oriented to Self, Oriented to Place, Oriented to  Time, Oriented to Situation Alcohol / Substance use:  Not Applicable Psych involvement (Current and /or in the community):  No (Comment)  Discharge Needs  Concerns to be addressed:  Discharge Planning Concerns Readmission within the last 30 days:  No Current discharge risk:  None Barriers to Discharge:  Continued Medical Work up   [employer], LCSW 06/28/2017, 3:25 PM  [phone]

## 2017-06-28 NOTE — [place] FL2 (Signed)
[place] MEDICAID FL2 LEVEL OF CARE SCREENING TOOL     IDENTIFICATION  Patient Name: LETY [name] Birthdate: 11-Dec-1930 Sex: female Admission Date (Current Location): 06/26/2017  [facility] and [place] Number:  Herbalist and Address:  [facility],  [address] [place], [place]      Provider Number: [id number]  Attending Physician Name and Address:  Georgette Shell, MD  Relative Name and Phone Number:       Current Level of Care: Hospital Recommended Level of Care: [place] Prior Approval Number:    Date Approved/Denied:   PASRR Number: [id number]  Discharge Plan: SNF    Current Diagnoses: Patient Active Problem List   Diagnosis Date Noted  . Acute lower UTI   . Acute encephalopathy 01/18/2017  . Chronic diastolic heart failure ([place]) 01/18/2017  . Chronic respiratory failure with hypoxia ([place]) 01/18/2017  . CKD (chronic kidney disease), stage III ([place]) 11/21/2016  . Obesity (BMI 30.0-34.9) 11/21/2016  . Venous stasis ulcer ([place]) 11/21/2016  . Venous stasis dermatitis of both lower extremities 11/21/2016  . MRSA carrier 11/21/2016  . Acute on chronic diastolic CHF (congestive heart failure) ([place]) 11/20/2016  . Goals of care, counseling/discussion   . Palliative care by specialist   . Chronic ITP (idiopathic thrombocytopenia) ([place]) 10/01/2016  . Gangrene ([place])   . Acute on chronic respiratory failure with hypoxia ([place])   . Coronary atherosclerosis of native coronary artery 10/09/2013  . Atrial fibrillation ([place]) 10/09/2013  . Leukocytosis 08/11/2013  . Chronic narcotic dependence ([place]) 07/16/2013  . Varicose veins of lower extremities with other complications 29/93/7169  . Edema 05/14/2013  . Hypokalemia 12/20/2012  . CAD (coronary artery disease) of artery bypass graft 12/18/2012  . Unstable angina ([place]) 10/18/2012  . Thrombocytopenia ([place]) 10/18/2012  . HTN (hypertension) 10/17/2012  . Chronic back pain  10/17/2012  . Precordial pain 10/17/2012    Orientation RESPIRATION BLADDER Height & Weight     Self, Time, Situation, Place  O2 (2L) Incontinent, External catheter Weight: 193 lb 12.6 oz (87.9 kg) Height:  _0  (162.6 cm)  BEHAVIORAL SYMPTOMS/MOOD NEUROLOGICAL BOWEL NUTRITION STATUS      Incontinent Diet (heart healthy)  AMBULATORY STATUS COMMUNICATION OF NEEDS Skin   Extensive Assist Verbally Other (Comment)   Wound type: Several patchy areas of partial thickness wounds;  Measurement: Right anterior calf 9X3X.1cm, pink dry healing partial thickness wound, no odor or drainage. Left anterior calf 1X1X.1cm, 2X1X.1cm,pink dry healing partial thickness wound, no odor or drainage. There is a full thickness puncture wound; .2X.2X.2cm in an "L" shape, red dry woundbed, no odor or drainage.  Dressing procedure/placement/frequency: Foam dressings to protect and promote healing.  Float heels to reduce pressure.                     Personal Care Assistance Level of Assistance  Bathing, Feeding, Dressing Bathing Assistance: Maximum assistance Feeding assistance: Independent Dressing Assistance: Limited assistance     Functional Limitations Info  Sight, Hearing, Speech Sight Info: Adequate Hearing Info: Adequate      SPECIAL CARE FACTORS FREQUENCY                       Contractures Contractures Info: Not present    Additional Factors Info  Isolation Precautions, Allergies, Code Status Code Status Info: full code Allergies Info: Keflex     Isolation Precautions Info: contact precautions- MRSA     Current Medications (06/28/2017):  This is the current hospital active medication list Current Facility-Administered Medications  Medication Dose Route Frequency Provider Last Rate Last Dose  . 0.9 %  sodium chloride infusion  250 mL Intravenous PRN Opyd, Ilene Qua, MD      . acetaminophen (TYLENOL) tablet 650 mg  650 mg Oral Q6H PRN Opyd, Ilene Qua, MD       Or  .  acetaminophen (TYLENOL) suppository 650 mg  650 mg Rectal Q6H PRN Opyd, Ilene Qua, MD      . acidophilus (RISAQUAD) capsule 1 capsule  1 capsule Oral q1800 Opyd, Ilene Qua, MD   1 capsule at 06/27/17 1800  . albuterol (PROVENTIL) (2.5 MG/3ML) 0.083% nebulizer solution 2.5 mg  2.5 mg Nebulization Q6H PRN Opyd, Ilene Qua, MD      . bisacodyl (DULCOLAX) EC tablet 5 mg  5 mg Oral Daily PRN Opyd, Ilene Qua, MD      . calcium-vitamin D (OSCAL WITH D) 500-200 MG-UNIT per tablet 2 tablet  2 tablet Oral Daily Opyd, Ilene Qua, MD   2 tablet at 06/28/17 1015  . ceFEPIme (MAXIPIME) 2 g in dextrose 5 % 50 mL IVPB  2 g Intravenous Q24H Lynelle Doctor, RPH   Stopped at 06/27/17 1730  . Chlorhexidine Gluconate Cloth 2 % PADS 6 each  6 each Topical Q0600 Opyd, Ilene Qua, MD   6 each at 06/27/17 0527  . digoxin (LANOXIN) tablet 0.0625 mg  0.0625 mg Oral Daily Opyd, Ilene Qua, MD   0.0625 mg at 06/28/17 1125  . diltiazem (CARDIZEM) tablet 60 mg  60 mg Oral Q6H Opyd, Ilene Qua, MD   60 mg at 06/28/17 1125  . gabapentin (NEURONTIN) capsule 300 mg  300 mg Oral BID Opyd, Ilene Qua, MD   300 mg at 06/28/17 1015  . insulin aspart (novoLOG) injection 0-5 Units  0-5 Units Subcutaneous QHS Opyd, Timothy S, MD      . insulin aspart (novoLOG) injection 0-9 Units  0-9 Units Subcutaneous TID WC Opyd, Ilene Qua, MD   1 Units at 06/28/17 1251  . MEDLINE mouth rinse  15 mL Mouth Rinse BID Opyd, Ilene Qua, MD   15 mL at 06/28/17 1016  . mupirocin ointment (BACTROBAN) 2 % 1 application  1 application Nasal BID Opyd, Ilene Qua, MD   1 application at 02/54/27 1015  . ondansetron (ZOFRAN) tablet 4 mg  4 mg Oral Q6H PRN Opyd, Ilene Qua, MD       Or  . ondansetron (ZOFRAN) injection 4 mg  4 mg Intravenous Q6H PRN Opyd, Ilene Qua, MD      . pantoprazole (PROTONIX) EC tablet 40 mg  40 mg Oral Daily Opyd, Ilene Qua, MD   40 mg at 06/28/17 1015  . polyvinyl alcohol (LIQUIFILM TEARS) 1.4 % ophthalmic solution 2 drop  2 drop Both Eyes BID Opyd,  Ilene Qua, MD   2 drop at 06/28/17 1015  . potassium chloride SA (K-DUR,KLOR-CON) CR tablet 20 mEq  20 mEq Oral BID Opyd, Ilene Qua, MD   20 mEq at 06/28/17 1015  . senna-docusate (Senokot-S) tablet 1 tablet  1 tablet Oral QHS PRN Opyd, Ilene Qua, MD      . sodium chloride (OCEAN) 0.65 % nasal spray 1 spray  1 spray Each Nare TID Opyd, Ilene Qua, MD   1 spray at 06/28/17 1015  . sodium chloride flush (NS) 0.9 % injection 10-40 mL  10-40 mL Intracatheter PRN Opyd, Ilene Qua, MD   10  mL at 06/27/17 0409  . sodium chloride flush (NS) 0.9 % injection 3 mL  3 mL Intravenous Q12H Opyd, Ilene Qua, MD   3 mL at 06/27/17 1000  . sodium chloride flush (NS) 0.9 % injection 3 mL  3 mL Intravenous Q12H Opyd, Ilene Qua, MD   3 mL at 06/27/17 1000  . sodium chloride flush (NS) 0.9 % injection 3 mL  3 mL Intravenous PRN Opyd, Ilene Qua, MD      . torsemide (DEMADEX) tablet 20 mg  20 mg Oral Daily Opyd, Ilene Qua, MD   20 mg at 06/28/17 1015  . traMADol (ULTRAM) tablet 50 mg  50 mg Oral Q6H PRN Opyd, Ilene Qua, MD   50 mg at 06/27/17 5025  . valACYclovir (VALTREX) tablet 1,000 mg  1,000 mg Oral Daily Opyd, Ilene Qua, MD   1,000 mg at 06/28/17 1015     Discharge Medications: Please see discharge summary for a list of discharge medications.  Relevant Imaging Results:  Relevant Lab Results:   Additional Information SSN [id number]  Nila Nephew, LCSW

## 2017-06-28 NOTE — Consult Note (Signed)
Referral MD  Reason for Referral: Chronic immune thrombocytopenia   Chief Complaint  Patient presents with  . Fatigue  : Patient really cannot give much history   HPI: [name] [name] is well-known to me. She is very charming 83 year old white female. I by have known her for 10 years. She has chronic immune thrombocytopenia. She had a bone marrow test done back in March.  We have been trying to get her platelet count up. She was supposed and started right toxin earlier this week. However, when she got to the office, she was very fatigued. She was weak. She just was not herself.  She was subsequently admitted. She was admitted on the 10th.  So far, cultures have been negative.  She had a CT angiogram of her chest. There is no pulmonary embolus. However, she had evidence of congestive heart failure. This is was been her major health issue.  She has had no obvious bleeding.  She does have some wounds that are being tended to by the wound nurse.  Her chemical studies looked fairly decent.   I'm not sure she is eating all that much. She has no obvious nausea or vomiting.  Her platelet count moist saw her in the office was 21,000. Yesterday, the plate count was 52,841.  She typically responds well to steroids.  Overall, her performance status is ECOG 3.    Past Medical History:  Diagnosis Date  . Atrial fibrillation ([place])   . CAD (coronary artery disease)    a. NSTEMI 2/14 => LHC 10/20/12:  dLM 20%, pLAD 99%, mLAD 60% followed by 99%, oD1 99%, and pD2 30%, mild plaque disease in the CFX and RCA. PCI: Xience Xpedition DES to the proximal and mid LAD  . Chronic diastolic CHF (congestive heart failure) ([place])    Echo (08/10/13): Mild LVH, EF 60-65%.  . DJD (degenerative joint disease)   . Dyslipidemia   . HTN (hypertension)   . Osteopenia   . Thrombocytopenia ([place])   . Venous ulcer ([place])   :  Past Surgical History:  Procedure Laterality Date  . BACK SURGERY     Lumbar disc  .  EYE SURGERY     Blepharoplasty  . HIP SURGERY     R THR  . INSERTION / PLACEMENT / REVISION NEUROSTIMULATOR    . IR FLUORO GUIDE CV LINE RIGHT  06/25/2017  . IR US GUIDE VASC ACCESS RIGHT  06/25/2017  . JOINT REPLACEMENT    . LEFT HEART CATHETERIZATION WITH CORONARY ANGIOGRAM N/A 10/20/2012   Procedure: LEFT HEART CATHETERIZATION WITH CORONARY ANGIOGRAM;  Surgeon: Burnell Blanks, MD;  Location: [facility] CATH LAB;  Service: Cardiovascular;  Laterality: N/A;  :   Current Facility-Administered Medications:  .  0.9 %  sodium chloride infusion, 250 mL, Intravenous, PRN, Opyd, Ilene Qua, MD .  acetaminophen (TYLENOL) tablet 650 mg, 650 mg, Oral, Q6H PRN **OR** acetaminophen (TYLENOL) suppository 650 mg, 650 mg, Rectal, Q6H PRN, Opyd, Timothy S, MD .  acidophilus (RISAQUAD) capsule 1 capsule, 1 capsule, Oral, q1800, Opyd, Ilene Qua, MD, 1 capsule at 06/27/17 1800 .  albuterol (PROVENTIL) (2.5 MG/3ML) 0.083% nebulizer solution 2.5 mg, 2.5 mg, Nebulization, Q6H PRN, Opyd, Timothy S, MD .  bisacodyl (DULCOLAX) EC tablet 5 mg, 5 mg, Oral, Daily PRN, Opyd, Ilene Qua, MD .  calcium-vitamin D (OSCAL WITH D) 500-200 MG-UNIT per tablet 2 tablet, 2 tablet, Oral, Daily, Opyd, Ilene Qua, MD, 2 tablet at 06/27/17 1100 .  ceFEPIme (MAXIPIME) 2 g in  dextrose 5 % 50 mL IVPB, 2 g, Intravenous, Q24H, Pham, Anh P, RPH, Stopped at 06/27/17 1730 .  Chlorhexidine Gluconate Cloth 2 % PADS 6 each, 6 each, Topical, Q0600, Opyd, Ilene Qua, MD, 6 each at 06/27/17 0527 .  digoxin (LANOXIN) tablet 0.0625 mg, 0.0625 mg, Oral, Daily, Opyd, Timothy S, MD, 0.0625 mg at 06/27/17 1000 .  diltiazem (CARDIZEM) tablet 60 mg, 60 mg, Oral, Q6H, Opyd, Ilene Qua, MD, 60 mg at 06/28/17 0617 .  gabapentin (NEURONTIN) capsule 300 mg, 300 mg, Oral, BID, Opyd, Ilene Qua, MD, 300 mg at 06/27/17 2239 .  insulin aspart (novoLOG) injection 0-5 Units, 0-5 Units, Subcutaneous, QHS, Opyd, Timothy S, MD .  insulin aspart (novoLOG) injection 0-9 Units,  0-9 Units, Subcutaneous, TID WC, Opyd, Ilene Qua, MD, 2 Units at 06/27/17 1252 .  MEDLINE mouth rinse, 15 mL, Mouth Rinse, BID, Opyd, Ilene Qua, MD, 15 mL at 06/27/17 2239 .  mupirocin ointment (BACTROBAN) 2 % 1 application, 1 application, Nasal, BID, Opyd, Ilene Qua, MD, 1 application at 74/25/95 2239 .  ondansetron (ZOFRAN) tablet 4 mg, 4 mg, Oral, Q6H PRN **OR** ondansetron (ZOFRAN) injection 4 mg, 4 mg, Intravenous, Q6H PRN, Opyd, Timothy S, MD .  pantoprazole (PROTONIX) EC tablet 40 mg, 40 mg, Oral, Daily, Opyd, Ilene Qua, MD, 40 mg at 06/27/17 1000 .  polyvinyl alcohol (LIQUIFILM TEARS) 1.4 % ophthalmic solution 2 drop, 2 drop, Both Eyes, BID, Opyd, Ilene Qua, MD, 2 drop at 06/27/17 2239 .  potassium chloride SA (K-DUR,KLOR-CON) CR tablet 20 mEq, 20 mEq, Oral, BID, Opyd, Ilene Qua, MD, 20 mEq at 06/27/17 2238 .  senna-docusate (Senokot-S) tablet 1 tablet, 1 tablet, Oral, QHS PRN, Opyd, Timothy S, MD .  sodium chloride (OCEAN) 0.65 % nasal spray 1 spray, 1 spray, Each Nare, TID, Opyd, Ilene Qua, MD, 1 spray at 06/27/17 2241 .  sodium chloride flush (NS) 0.9 % injection 10-40 mL, 10-40 mL, Intracatheter, PRN, Opyd, Ilene Qua, MD, 10 mL at 06/27/17 0409 .  sodium chloride flush (NS) 0.9 % injection 3 mL, 3 mL, Intravenous, Q12H, Opyd, Timothy S, MD, 3 mL at 06/27/17 1000 .  sodium chloride flush (NS) 0.9 % injection 3 mL, 3 mL, Intravenous, Q12H, Opyd, Timothy S, MD, 3 mL at 06/27/17 1000 .  sodium chloride flush (NS) 0.9 % injection 3 mL, 3 mL, Intravenous, PRN, Opyd, Timothy S, MD .  torsemide (DEMADEX) tablet 20 mg, 20 mg, Oral, Daily, Opyd, Ilene Qua, MD, 20 mg at 06/27/17 1105 .  traMADol (ULTRAM) tablet 50 mg, 50 mg, Oral, Q6H PRN, Opyd, Ilene Qua, MD, 50 mg at 06/27/17 0527 .  valACYclovir (VALTREX) tablet 1,000 mg, 1,000 mg, Oral, Daily, Opyd, Timothy S, MD, 1,000 mg at 06/27/17 1059:  . acidophilus  1 capsule Oral q1800  . calcium-vitamin D  2 tablet Oral Daily  . Chlorhexidine  Gluconate Cloth  6 each Topical Q0600  . digoxin  0.0625 mg Oral Daily  . diltiazem  60 mg Oral Q6H  . gabapentin  300 mg Oral BID  . insulin aspart  0-5 Units Subcutaneous QHS  . insulin aspart  0-9 Units Subcutaneous TID WC  . mouth rinse  15 mL Mouth Rinse BID  . mupirocin ointment  1 application Nasal BID  . pantoprazole  40 mg Oral Daily  . polyvinyl alcohol  2 drop Both Eyes BID  . potassium chloride SA  20 mEq Oral BID  . sodium chloride  1 spray Each Nare TID  .  sodium chloride flush  3 mL Intravenous Q12H  . sodium chloride flush  3 mL Intravenous Q12H  . torsemide  20 mg Oral Daily  . valACYclovir  1,000 mg Oral Daily  :  Allergies  Allergen Reactions  . Keflex [Cephalexin] Other (See Comments)    Reaction:  Unknown   :  Family History  Problem Relation Age of Onset  . CAD Father 2       Vague history   :  Social History   Social History  . Marital status: Divorced    Spouse name: N/A  . Number of children: 2  . Years of education: N/A   Occupational History  .  Retired   Social History Main Topics  . Smoking status: Never Smoker  . Smokeless tobacco: Never Used  . Alcohol use No  . Drug use: No  . Sexual activity: Not on file   Other Topics Concern  . Not on file   Social History Narrative   Lives at [facility].  Two adopted children. Divorced.    :  Pertinent items are noted in HPI.  Exam: Patient Vitals for the past 24 hrs:  BP Temp Temp src Pulse Resp SpO2 Weight  06/28/17 0557 - - - - - - 193 lb 12.6 oz (87.9 kg)  06/28/17 0525 138/65 98.5 F (36.9 C) Oral (!) 108 20 93 % -  06/27/17 1242 (!) 141/88 98.7 F (37.1 C) Oral 80 18 97 % -    As above    Recent Labs  06/27/17 0406 06/27/17 1447  WBC 7.8 8.9  HGB 10.9* 11.3*  HCT 35.8* 37.6  PLT 15* 29*    Recent Labs  06/27/17 0406 06/28/17 0329  NA 143 142  K 3.5 3.3*  CL 103 102  CO2 28 28  GLUCOSE 81 92  BUN 28* 24*  CREATININE 1.28* 1.23*  CALCIUM 8.3* 8.5*     Blood smear review:  In our office, her red cells were slightly microcytic. She has some anisocytosis. She had some nucleated red blood cells. I saw no blasts. Her white cell showed no hypersegmented polys. Her platelets were decreased in number. She had several large platelets that were well granulated.  Pathology: None     Assessment and Plan:  [name] [name] is 83 year old white female. She has chronic immune thrombocytopenia.  Her performance status is not great by any means. She had has a markedly elevated beta natruretic peptide.  I thought that right toxin could help get her platelet count little bit higher.  Thankfully, no obvious infection has been found.  We could give her a dose of steroids in the hospital. This usually has worked for her in the past. Because this has worked, rituximab should work well.  I agree with the DO NOT RESUSCITATE status. We really want to make sure her quality of life is adequate.  I appreciate all the great help that she is getting from all the staff up on [address].  Lattie Haw, MD

## 2017-06-29 ENCOUNTER — Inpatient Hospital Stay (HOSPITAL_COMMUNITY): Payer: Medicare Other

## 2017-06-29 ENCOUNTER — Encounter (HOSPITAL_COMMUNITY): Payer: Self-pay

## 2017-06-29 LAB — CBC
HCT: 36.5 % (ref 36.0–46.0)
Hemoglobin: 11 g/dL — ABNORMAL LOW (ref 12.0–15.0)
MCH: 25.8 pg — AB (ref 26.0–34.0)
MCHC: 30.1 g/dL (ref 30.0–36.0)
MCV: 85.5 fL (ref 78.0–100.0)
PLATELETS: 15 10*3/uL — AB (ref 150–400)
RBC: 4.27 MIL/uL (ref 3.87–5.11)
RDW: 16.5 % — AB (ref 11.5–15.5)
WBC: 7.4 10*3/uL (ref 4.0–10.5)

## 2017-06-29 LAB — BASIC METABOLIC PANEL
ANION GAP: 9 (ref 5–15)
BUN: 19 mg/dL (ref 6–20)
CHLORIDE: 105 mmol/L (ref 101–111)
CO2: 28 mmol/L (ref 22–32)
Calcium: 8.5 mg/dL — ABNORMAL LOW (ref 8.9–10.3)
Creatinine, Ser: 1 mg/dL (ref 0.44–1.00)
GFR calc Af Amer: 58 mL/min — ABNORMAL LOW (ref >60)
GFR calc non Af Amer: 50 mL/min — ABNORMAL LOW (ref >60)
GLUCOSE: 88 mg/dL (ref 65–99)
POTASSIUM: 3.3 mmol/L — AB (ref 3.5–5.1)
SODIUM: 142 mmol/L (ref 135–145)

## 2017-06-29 LAB — GLUCOSE, CAPILLARY
GLUCOSE-CAPILLARY: 108 mg/dL — AB (ref 65–99)
GLUCOSE-CAPILLARY: 152 mg/dL — AB (ref 65–99)
Glucose-Capillary: 81 mg/dL (ref 65–99)
Glucose-Capillary: 289 mg/dL — ABNORMAL HIGH (ref 65–99)

## 2017-06-29 MED ORDER — PREDNISONE 20 MG PO TABS: 20.0000 mg | ORAL_TABLET | Freq: Every day | ORAL | Status: DC

## 2017-06-29 MED ORDER — METHYLPREDNISOLONE SODIUM SUCC 40 MG IJ SOLR
40.0000 mg | Freq: Every day | INTRAMUSCULAR | Status: DC
  Administered 2017-06-29 – 2017-06-30 (×2): 40 mg via INTRAVENOUS
  Filled 2017-06-29 (×2): qty 1

## 2017-06-29 MED ORDER — VITAMINS A & D EX OINT
TOPICAL_OINTMENT | CUTANEOUS | Status: AC
  Administered 2017-06-29: 12:00:00
  Filled 2017-06-29: qty 5

## 2017-06-29 NOTE — Progress Notes (Signed)
Patient ID: [name] [name], female   DOB: 04-Mar-1931, 83 y.o.   MRN: [id number]  PROGRESS NOTE    [name] [name]  JQB:[id number] DOB: [birth year] DOA: 06/26/2017  PCP: Lajean Manes, MD   Brief Narrative:  83 year old female with paroxysmal a fib not on AC, CAD with stents, chronic diastolic CHF, hypertension, chronic ITP who presented to ED with altered mental status and hypoxia. Even with supplemental oxygen she remained confused.   Assessment & Plan:   Principal Problem:   Acute metabolic encephalopathy - Likely due to UTI - CT scan showed chronic microvascular ischemia  - CT angio negative for PE - She improved a little with narcan  Active Problems:   UTI - UA showed TNTC WBC - Urine cx with multiple species none predominant - Continue cefepime     Acute on chronic diastolic CHF (congestive heart failure) (HCC) / Acute respiratory failure with hypoxia - Pt noted to have pulmonary edema on imagining - CTA showed extensive ground glass attenuation worrisome for CHF - Continue torsemide - Continue digoxin and Cardizem    Paroxysmal Afib - CHA2DS2-VASc Score is 4 - Not on AC - She is on digoxin and Cardizem for rate control     Hypokalemia - Due to diuretic - Continue to supplement    Chronic ITP (idiopathic thrombocytopenia) ([place]) - Oncology wanted to start trial of steroids - Will start solumedrol 40 mg IV daily    CKD (chronic kidney disease), stage III (HCC) - Stable Cr and now WNL  DVT prophylaxis: SCD's Code Status: DNR/DNI Family Communication: no family at the bedside Disposition Plan: home once stable, once platelets stable and preferably above 30   Consultants:   Oncology, Dr. Marin Olp   Procedures:   None   Antimicrobials:   Cefepime 10/10 -->   Subjective: No overnight events.  Objective: Vitals:   06/28/17 1328 06/28/17 2205 06/29/17 0053 06/29/17 0627  BP: 108/73 127/64 (!) 154/71 139/76  Pulse: (!) 112 83 95 (!) 110  Resp:  _0 Temp: 99 F (37.2 C) 98.4 F (36.9 C)  97.8 F (36.6 C)  TempSrc: Oral Oral  Oral  SpO2: 90% 92%  94%  Weight:    87.9 kg (193 lb 12.6 oz)  Height:        Intake/Output Summary (Last 24 hours) at 06/29/17 0911 Last data filed at 06/29/17 0630  Gross per 24 hour  Intake              120 ml  Output             1550 ml  Net            -1430 ml   Filed Weights   06/27/17 0501 06/28/17 0557 06/29/17 0627  Weight: 88.6 kg (195 lb 5.2 oz) 87.9 kg (193 lb 12.6 oz) 87.9 kg (193 lb 12.6 oz)    Examination:  General exam: Appears calm and comfortable  Respiratory system: Clear to auscultation. Respiratory effort normal. Cardiovascular system: S1 & S2 heard, Rate controlled  Gastrointestinal system: Abdomen is nondistended, soft and nontender. No organomegaly or masses felt. Normal bowel sounds heard. Central nervous system: Confused, no focal deficits  Extremities: +2 LE edema, no tenderness  Skin: No rashes, lesions or ulcers Psychiatry: Judgement and insight appear normal. Mood & affect appropriate.   Data Reviewed: I have personally reviewed following labs and imaging studies  CBC:  Recent Labs Lab 06/26/17 1005 06/27/17 0406 06/27/17 1447  06/29/17 0324  WBC 9.4 7.8 8.9 7.4  NEUTROABS 7.0*  --   --   --   HGB 12.2 10.9* 11.3* 11.0*  HCT 40.2 35.8* 37.6 36.5  MCV 87 86.3 86.6 85.5  PLT 21 Platelet count consistent in citrate* 15* 29* 15*   Basic Metabolic Panel:  Recent Labs Lab 06/26/17 1005 06/27/17 0406 06/28/17 0329 06/29/17 0324  NA 143 143 142 142  K 3.7 3.5 3.3* 3.3*  CL 104 103 102 105  CO2 _0 GLUCOSE 159* 81 92 88  BUN 29* 28* 24* 19  CREATININE 1.4* 1.28* 1.23* 1.00  CALCIUM 9.2 8.3* 8.5* 8.5*   GFR: Estimated Creatinine Clearance: 45 mL/min (by C-G formula based on SCr of 1 mg/dL). Liver Function Tests:  Recent Labs Lab 06/26/17 1005  AST 14  ALT 13  ALKPHOS 99*  BILITOT 0.70  PROT 6.1*  ALBUMIN 2.4*   No results  for input(s): LIPASE, AMYLASE in the last 168 hours. No results for input(s): AMMONIA in the last 168 hours. Coagulation Profile: No results for input(s): INR, PROTIME in the last 168 hours. Cardiac Enzymes:  Recent Labs Lab 06/26/17 1308  TROPONINI <0.03   BNP (last 3 results) No results for input(s): PROBNP in the last 8760 hours. HbA1C: No results for input(s): HGBA1C in the last 72 hours. CBG:  Recent Labs Lab 06/28/17 0730 06/28/17 1133 06/28/17 1649 06/28/17 2202 06/29/17 0748  GLUCAP 82 124* 132* 125* 81   Lipid Profile: No results for input(s): CHOL, HDL, LDLCALC, TRIG, CHOLHDL, LDLDIRECT in the last 72 hours. Thyroid Function Tests: No results for input(s): TSH, T4TOTAL, FREET4, T3FREE, THYROIDAB in the last 72 hours. Anemia Panel: No results for input(s): VITAMINB12, FOLATE, FERRITIN, TIBC, IRON, RETICCTPCT in the last 72 hours. Urine analysis:    Component Value Date/Time   COLORURINE YELLOW 06/26/2017 1400   APPEARANCEUR CLOUDY (A) 06/26/2017 1400   LABSPEC 1.010 06/26/2017 1400   PHURINE 6.0 06/26/2017 1400   GLUCOSEU NEGATIVE 06/26/2017 1400   HGBUR LARGE (A) 06/26/2017 1400   BILIRUBINUR NEGATIVE 06/26/2017 1400   KETONESUR NEGATIVE 06/26/2017 1400   PROTEINUR NEGATIVE 06/26/2017 1400   UROBILINOGEN 0.2 08/10/2013 0037   NITRITE POSITIVE (A) 06/26/2017 1400   LEUKOCYTESUR LARGE (A) 06/26/2017 1400   Sepsis Labs: _1 (procalcitonin:4,lacticidven:4)   ) Recent Results (from the past 240 hour(s))  Urine culture     Status: Abnormal   Collection Time: 06/26/17  2:00 PM  Result Value Ref Range Status   Specimen Description URINE, RANDOM  Final   Special Requests NONE  Final   Culture MULTIPLE SPECIES PRESENT, SUGGEST RECOLLECTION (A)  Final   Report Status 06/27/2017 FINAL  Final  MRSA PCR Screening     Status: Abnormal   Collection Time: 06/26/17  9:14 PM  Result Value Ref Range Status   MRSA by PCR POSITIVE (A) NEGATIVE Final       Radiology Studies: Ct Head Wo Contrast  Result Date: 06/26/2017 CLINICAL DATA:  Altered mental status EXAM: CT HEAD WITHOUT CONTRAST TECHNIQUE: Contiguous axial images were obtained from the base of the skull through the vertex without intravenous contrast. COMPARISON:  Head CT 01/18/2017 FINDINGS: Brain: No mass lesion, intraparenchymal hemorrhage or extra-axial collection. No evidence of acute cortical infarct. There is periventricular hypoattenuation compatible with chronic microvascular disease. Vascular: No hyperdense vessel or unexpected calcification. Skull: Normal visualized skull base, calvarium and extracranial soft tissues. Sinuses/Orbits: No sinus fluid levels or advanced mucosal thickening. No mastoid  effusion. Normal orbits. IMPRESSION: Chronic microvascular ischemia without acute intracranial abnormality. Electronically Signed   By: Ulyses Jarred M.D.   On: 06/26/2017 15:06   Ct Angio Chest Pe W And/or Wo Contrast  Result Date: 06/26/2017 CLINICAL DATA:  Altered level of consciousness and decreased oxygen saturation today. EXAM: CT ANGIOGRAPHY CHEST WITH CONTRAST TECHNIQUE: Multidetector CT imaging of the chest was performed using the standard protocol during bolus administration of intravenous contrast. Multiplanar CT image reconstructions and MIPs were obtained to evaluate the vascular anatomy. CONTRAST:  100 cc Isovue 370. COMPARISON:  CT chest 01/19/2017. Single-view of the chest earlier today. FINDINGS: Cardiovascular: No pulmonary embolus is identified. There is calcific aortic and coronary atherosclerosis. Cardiomegaly is seen. No pericardial effusion. Contrast refluxes into the inferior vena cava and hepatic veins compatible with right heart insufficiency. Mediastinum/Nodes: No enlarged mediastinal, hilar, or axillary lymph nodes. Thyroid gland, trachea, and esophagus demonstrate no significant findings. Lungs/Pleura: No pleural effusion. As on the prior CT, the lungs  demonstrate extensive ground-glass attenuation with an upper lobe predominance. No nodule or mass is identified. Upper Abdomen: No acute abnormality. Musculoskeletal: Spinal stimulator is in place. There is exaggeration of the normal thoracic kyphosis. Multilevel degenerative disc disease is identified. No fracture. No lytic or sclerotic lesion. Review of the MIP images confirms the above findings. IMPRESSION: Negative for pulmonary embolus. Extensive ground-glass attenuation in the lungs is most worrisome for congestive heart failure given marked cardiomegaly. Aortic Atherosclerosis (ICD10-I70.0). Electronically Signed   By: Inge Rise M.D.   On: 06/26/2017 15:13   Ir Fluoro Guide Cv Line Right  Result Date: 06/25/2017 INDICATION: Poor venous access. In need of intravenous access for short-term chemotherapy administration. EXAM: ULTRASOUND AND FLUOROSCOPIC GUIDED PICC LINE INSERTION MEDICATIONS: None. CONTRAST:  None FLUOROSCOPY TIME:  12 seconds (8 mGy) COMPLICATIONS: None immediate. TECHNIQUE: The procedure, risks, benefits, and alternatives were explained to the patient and informed written consent was obtained. A timeout was performed prior to the initiation of the procedure. The right upper extremity was prepped with chlorhexidine in a sterile fashion, and a sterile drape was applied covering the operative field. Maximum barrier sterile technique with sterile gowns and gloves were used for the procedure. A timeout was performed prior to the initiation of the procedure. Local anesthesia was provided with 1% lidocaine. Under direct ultrasound guidance, the basilic vein was accessed with a micropuncture kit after the overlying soft tissues were anesthetized with 1% lidocaine. After the overlying soft tissues were anesthetized, a small venotomy incision was created and a micropuncture kit was utilized to access the right basilic vein. Real-time ultrasound guidance was utilized for vascular access  including the acquisition of a permanent ultrasound image documenting patency of the accessed vessel. A guidewire was advanced to the level of the superior caval-atrial junction for measurement purposes and the PICC line was cut to length. A peel-away sheath was placed and a 34 cm, 5 [place], single lumen was inserted to level of the superior caval-atrial junction. A post procedure spot fluoroscopic was obtained. The catheter easily aspirated and flushed and was sutured in place. A dressing was placed. The patient tolerated the procedure well without immediate post procedural complication. FINDINGS: After catheter placement, the tip lies within the superior cavoatrial junction. The catheter aspirates and flushes normally and is ready for immediate use. IMPRESSION: Successful ultrasound and fluoroscopic guided placement of a right basilic vein approach, 34 cm, 5 French, single lumen PICC with tip at the superior caval-atrial junction. The PICC line is  ready for immediate use. Electronically Signed   By: Sandi Mariscal M.D.   On: 06/25/2017 12:26   Ir US Guide Vasc Access Right  Result Date: 06/25/2017 INDICATION: Poor venous access. In need of intravenous access for short-term chemotherapy administration. EXAM: ULTRASOUND AND FLUOROSCOPIC GUIDED PICC LINE INSERTION MEDICATIONS: None. CONTRAST:  None FLUOROSCOPY TIME:  12 seconds (8 mGy) COMPLICATIONS: None immediate. TECHNIQUE: The procedure, risks, benefits, and alternatives were explained to the patient and informed written consent was obtained. A timeout was performed prior to the initiation of the procedure. The right upper extremity was prepped with chlorhexidine in a sterile fashion, and a sterile drape was applied covering the operative field. Maximum barrier sterile technique with sterile gowns and gloves were used for the procedure. A timeout was performed prior to the initiation of the procedure. Local anesthesia was provided with 1% lidocaine. Under direct  ultrasound guidance, the basilic vein was accessed with a micropuncture kit after the overlying soft tissues were anesthetized with 1% lidocaine. After the overlying soft tissues were anesthetized, a small venotomy incision was created and a micropuncture kit was utilized to access the right basilic vein. Real-time ultrasound guidance was utilized for vascular access including the acquisition of a permanent ultrasound image documenting patency of the accessed vessel. A guidewire was advanced to the level of the superior caval-atrial junction for measurement purposes and the PICC line was cut to length. A peel-away sheath was placed and a 34 cm, 5 [place], single lumen was inserted to level of the superior caval-atrial junction. A post procedure spot fluoroscopic was obtained. The catheter easily aspirated and flushed and was sutured in place. A dressing was placed. The patient tolerated the procedure well without immediate post procedural complication. FINDINGS: After catheter placement, the tip lies within the superior cavoatrial junction. The catheter aspirates and flushes normally and is ready for immediate use. IMPRESSION: Successful ultrasound and fluoroscopic guided placement of a right basilic vein approach, 34 cm, 5 French, single lumen PICC with tip at the superior caval-atrial junction. The PICC line is ready for immediate use. Electronically Signed   By: Sandi Mariscal M.D.   On: 06/25/2017 12:26   Dg Chest Port 1 View  Result Date: 06/26/2017 CLINICAL DATA:  83 year old female with hypoxia. EXAM: PORTABLE CHEST 1 VIEW COMPARISON:  01/21/2017 and earlier. FINDINGS: Portable AP semi upright view at 1143 hours. Lower lung volumes. Stable cardiac size and mediastinal contours. Right side PICC line in place today. Chronic thoracic spinal stimulator device and lumbar fusion. Increased pulmonary vascularity and/or interstitial markings are similar to May, when chest CT demonstrated abnormal ground-glass  opacity and septal thickening. No pneumothorax, pleural effusion, or consolidation. Advanced degenerative changes at both shoulders. Negative visible bowel gas pattern. IMPRESSION: Lower lung volumes. Increased pulmonary interstitial opacity similar to May, suspect acute interstitial edema. Viral/atypical respiratory infection fell less likely. Electronically Signed   By: Genevie Ann M.D.   On: 06/26/2017 12:27        Scheduled Meds: . acidophilus  1 capsule Oral q1800  . calcium-vitamin D  2 tablet Oral Daily  . digoxin  0.0625 mg Oral Daily  . diltiazem  60 mg Oral Q6H  . gabapentin  300 mg Oral BID  . insulin aspart  0-5 Units Subcutaneous QHS  . insulin aspart  0-9 Units Subcutaneous TID WC  . pantoprazole  40 mg Oral Daily  . potassium chloride   20 mEq Oral BID  . torsemide  20 mg Oral Daily  .  valACYclovir  1,000 mg Oral Daily   Continuous Infusions: . sodium chloride    . ceFEPime (MAXIPIME) IV Stopped (06/28/17 1608)     LOS: 3 days    Time spent:25 minutes  Greater than 50% of the time spent on counseling and coordinating the care.   Leisa Lenz, MD [facility] Pager [phone]  If 7PM-7AM, please contact night-coverage [email or web address] Password [email or web address] 06/29/2017, 9:11 AM

## 2017-06-29 NOTE — Progress Notes (Signed)
Care was resumed for this patient at [facility] from previous RN. Agree with previous assessment.

## 2017-06-29 NOTE — Progress Notes (Signed)
Pharmacy Antibiotic Note  [name] [name] is a 83 y.o. female with chronic ITP presented to [facility] on 06/26/17 for her first treatment of rituxan, but was found to be lethargic.  Patient was then sent to the ED for further workup.  To start cefepime for suspected UTI.  Day #4 Cefepime. SCr improved to baseline.  Cefepime 2g q24h dose still appropriate for UTI indication.  Plan: Continue Cefepime 2gm IV q24h. Do not anticipate further dose adjustments. Pharmacy will sign off at this time. Please reconsult if a change in clinical status warrants re-evaluation of dosage.  _______________________________________  Height: _0  (162.6 cm) Weight: 193 lb 12.6 oz (87.9 kg) IBW/kg (Calculated) : 54.7  Temp (24hrs), Avg:98.4 F (36.9 C), Min:97.8 F (36.6 C), Max:99 F (37.2 C)   Recent Labs Lab 06/26/17 1005 06/27/17 0406 06/27/17 1447 06/28/17 0329 06/29/17 0324  WBC 9.4 7.8 8.9  --  7.4  CREATININE 1.4* 1.28*  --  1.23* 1.00    Estimated Creatinine Clearance: 45 mL/min (by C-G formula based on SCr of 1 mg/dL).    Allergies  Allergen Reactions  . Keflex [Cephalexin] Other (See Comments)    Reaction:  Unknown    Antimicrobials this admission:  10/10 cefepime>>  Microbiology results:  10/10 UCx: multiple species, suggest recollection   Thank you for allowing pharmacy to be a part of this patient's care.  Hershal Coria 06/29/2017 9:39 AM

## 2017-06-30 ENCOUNTER — Inpatient Hospital Stay (HOSPITAL_COMMUNITY): Payer: Medicare Other

## 2017-06-30 ENCOUNTER — Encounter (HOSPITAL_COMMUNITY): Payer: Self-pay

## 2017-06-30 DIAGNOSIS — M7989 Other specified soft tissue disorders: Secondary | ICD-10-CM

## 2017-06-30 LAB — BASIC METABOLIC PANEL
Anion gap: 6 (ref 5–15)
BUN: 20 mg/dL (ref 6–20)
CO2: 29 mmol/L (ref 22–32)
Calcium: 8.6 mg/dL — ABNORMAL LOW (ref 8.9–10.3)
Chloride: 105 mmol/L (ref 101–111)
Creatinine, Ser: 1.07 mg/dL — ABNORMAL HIGH (ref 0.44–1.00)
GFR calc Af Amer: 54 mL/min — ABNORMAL LOW (ref >60)
GFR, EST NON AFRICAN AMERICAN: 46 mL/min — AB (ref >60)
GLUCOSE: 253 mg/dL — AB (ref 65–99)
POTASSIUM: 4.1 mmol/L (ref 3.5–5.1)
Sodium: 140 mmol/L (ref 135–145)

## 2017-06-30 LAB — GLUCOSE, CAPILLARY
GLUCOSE-CAPILLARY: 186 mg/dL — AB (ref 65–99)
GLUCOSE-CAPILLARY: 279 mg/dL — AB (ref 65–99)
Glucose-Capillary: 224 mg/dL — ABNORMAL HIGH (ref 65–99)
Glucose-Capillary: 265 mg/dL — ABNORMAL HIGH (ref 65–99)

## 2017-06-30 LAB — CBC
HCT: 38.4 % (ref 36.0–46.0)
Hemoglobin: 11.9 g/dL — ABNORMAL LOW (ref 12.0–15.0)
MCH: 26.2 pg (ref 26.0–34.0)
MCHC: 31 g/dL (ref 30.0–36.0)
MCV: 84.6 fL (ref 78.0–100.0)
PLATELETS: 13 10*3/uL — AB (ref 150–400)
RBC: 4.54 MIL/uL (ref 3.87–5.11)
RDW: 16.4 % — AB (ref 11.5–15.5)
WBC: 2.9 10*3/uL — ABNORMAL LOW (ref 4.0–10.5)

## 2017-06-30 NOTE — Progress Notes (Signed)
Doppler showed subacute non-occlusive DVT in RUE. Not on AC due to ITP.  Leisa Lenz [facility] [phone]

## 2017-06-30 NOTE — Progress Notes (Signed)
MD updated, remove PICC line, IVT consulted. SRP, RN

## 2017-06-30 NOTE — Progress Notes (Signed)
*  PRELIMINARY RESULTS* Vascular Ultrasound Right upper extremity venous duplex has been completed.  Preliminary findings: Findings suggestive of a small segment of nonocclusive, subacute deep vein thrombosis around the PICC line involving the proximal subclavian vein of the right upper extremity. Other visualized veins appear negative for deep and superficial thrombus.  Incidental findings: hypoechoic area with hyperechoic focus seen anterior right axilla, etiology unknown.  Attempted to page Dr. Charlies Silvers with preliminary results @ 11:10.  Myrtie Cruise Ngai Parcell 06/30/2017, 10:06 AM

## 2017-06-30 NOTE — Progress Notes (Signed)
Patient ID: [name] [name], female   DOB: 16-Feb-1931, 83 y.o.   MRN: [id number]  PROGRESS NOTE    [name] [name]  VOH:[id number] DOB: 19-Mar-1931 DOA: 06/26/2017  PCP: Lajean Manes, MD   Brief Narrative:  83 year old female with paroxysmal a fib not on AC, CAD with stents, chronic diastolic CHF, hypertension, chronic ITP who presented to ED with altered mental status and hypoxia. Even with supplemental oxygen she remained confused.   Assessment & Plan:   Principal Problem:   Acute metabolic encephalopathy - Perhaps due to UTI - CT scan showed chronic microvascular ischemia - CT angio negative for PE - No significant changes ine mental status   Active Problems:   UTI - UA showed TNTC WBC - Urine cx with multiple species, none predominant  - Continue cefepime    Acute on chronic diastolic CHF (congestive heart failure) (HCC) / Acute respiratory failure with hypoxia - Pt noted to have pulmonary edema on imagining - CTA showed extensive ground glass attenuation worrisome for CHF - Continue torsemide, digoxin and Cardizem     Paroxysmal Afib - CHA2DS2-VASc Score is 4 - Continue Cardizem and digoxin - Not on AC    Hypokalemia - Due to torsemide - Supplemented - Follow up BMP in am    Chronic ITP (idiopathic thrombocytopenia) ([place]) - Oncology wanted to start trial of steroids - Started solumedrol daily 10/13 - Platelets 13 this am    CKD (chronic kidney disease), stage III (HCC) - Cr stable, within baseline range   DVT prophylaxis: SCD's due to thrombocytopenia  Code Status: DNR/DNI Family Communication: no family at the bedside  Disposition Plan: home once platelets are at least 30   Consultants:   Oncology, Dr. Marin Olp   Procedures:   None  Antimicrobials:   Cefepime 10/10 -->   Subjective: No overnight events.  Objective: Vitals:   06/29/17 1400 06/29/17 2031 06/30/17 0022 06/30/17 0538  BP: (!) 153/80 (!) 157/68 (!) 146/87 (!) 150/90    Pulse: (!) 115 (!) 102  86  Resp: 20 20  (!) 22  Temp: 97.8 F (36.6 C) 97.7 F (36.5 C)  97.6 F (36.4 C)  TempSrc: Oral Axillary  Oral  SpO2: 95% 94%  95%  Weight:    88.2 kg (194 lb 7.1 oz)  Height:        Intake/Output Summary (Last 24 hours) at 06/30/17 0959 Last data filed at 06/30/17 0600  Gross per 24 hour  Intake              630 ml  Output              700 ml  Net              -70 ml   Filed Weights   06/28/17 0557 06/29/17 0627 06/30/17 0538  Weight: 87.9 kg (193 lb 12.6 oz) 87.9 kg (193 lb 12.6 oz) 88.2 kg (194 lb 7.1 oz)    Physical Exam  Constitutional: Appears well-developed and well-nourished. No distress.  CVS: Rate controlled, S1/S2 + Pulmonary: normal breath sounds, no wheezing  Abdominal: Soft. BS +,  no distension, tenderness, rebound or guarding.  Musculoskeletal: +2 LE pitting edema, no tenderness  Lymphadenopathy: No lymphadenopathy noted, cervical, inguinal. Neuro: Alert. Disoriented  Skin: Skin is warm and dry.  Psychiatric: Normal mood and affect.     Data Reviewed: I have personally reviewed following labs and imaging studies  CBC:  Recent Labs Lab 06/26/17 1005 06/27/17 0406  06/27/17 1447 06/29/17 0324 06/30/17 0315  WBC 9.4 7.8 8.9 7.4 2.9*  NEUTROABS 7.0*  --   --   --   --   HGB 12.2 10.9* 11.3* 11.0* 11.9*  HCT 40.2 35.8* 37.6 36.5 38.4  MCV 87 86.3 86.6 85.5 84.6  PLT 21 Platelet count consistent in citrate* 15* 29* 15* 13*   Basic Metabolic Panel:  Recent Labs Lab 06/26/17 1005 06/27/17 0406 06/28/17 0329 06/29/17 0324 06/30/17 0315  NA 143 143 142 142 140  K 3.7 3.5 3.3* 3.3* 4.1  CL 104 103 102 105 105  CO2 _0 GLUCOSE 159* 81 92 88 253*  BUN 29* 28* 24* 19 20  CREATININE 1.4* 1.28* 1.23* 1.00 1.07*  CALCIUM 9.2 8.3* 8.5* 8.5* 8.6*   GFR: Estimated Creatinine Clearance: 42.1 mL/min (A) (by C-G formula based on SCr of 1.07 mg/dL (H)). Liver Function Tests:  Recent Labs Lab 06/26/17 1005   AST 14  ALT 13  ALKPHOS 99*  BILITOT 0.70  PROT 6.1*  ALBUMIN 2.4*   No results for input(s): LIPASE, AMYLASE in the last 168 hours. No results for input(s): AMMONIA in the last 168 hours. Coagulation Profile: No results for input(s): INR, PROTIME in the last 168 hours. Cardiac Enzymes:  Recent Labs Lab 06/26/17 1308  TROPONINI <0.03   BNP (last 3 results) No results for input(s): PROBNP in the last 8760 hours. HbA1C: No results for input(s): HGBA1C in the last 72 hours. CBG:  Recent Labs Lab 06/29/17 0748 06/29/17 1130 06/29/17 1647 06/29/17 2034 06/30/17 0827  GLUCAP 81 108* 152* 289* 186*   Lipid Profile: No results for input(s): CHOL, HDL, LDLCALC, TRIG, CHOLHDL, LDLDIRECT in the last 72 hours. Thyroid Function Tests: No results for input(s): TSH, T4TOTAL, FREET4, T3FREE, THYROIDAB in the last 72 hours. Anemia Panel: No results for input(s): VITAMINB12, FOLATE, FERRITIN, TIBC, IRON, RETICCTPCT in the last 72 hours. Urine analysis:    Component Value Date/Time   COLORURINE YELLOW 06/26/2017 1400   APPEARANCEUR CLOUDY (A) 06/26/2017 1400   LABSPEC 1.010 06/26/2017 1400   PHURINE 6.0 06/26/2017 1400   GLUCOSEU NEGATIVE 06/26/2017 1400   HGBUR LARGE (A) 06/26/2017 1400   BILIRUBINUR NEGATIVE 06/26/2017 1400   KETONESUR NEGATIVE 06/26/2017 1400   PROTEINUR NEGATIVE 06/26/2017 1400   UROBILINOGEN 0.2 08/10/2013 0037   NITRITE POSITIVE (A) 06/26/2017 1400   LEUKOCYTESUR LARGE (A) 06/26/2017 1400   Sepsis Labs: _1 (procalcitonin:4,lacticidven:4)   ) Recent Results (from the past 240 hour(s))  Urine culture     Status: Abnormal   Collection Time: 06/26/17  2:00 PM  Result Value Ref Range Status   Specimen Description URINE, RANDOM  Final   Special Requests NONE  Final   Culture MULTIPLE SPECIES PRESENT, SUGGEST RECOLLECTION (A)  Final   Report Status 06/27/2017 FINAL  Final  MRSA PCR Screening     Status: Abnormal   Collection Time: 06/26/17   9:14 PM  Result Value Ref Range Status   MRSA by PCR POSITIVE (A) NEGATIVE Final      Radiology Studies: Ct Head Wo Contrast  Result Date: 06/26/2017 CLINICAL DATA:  Altered mental status EXAM: CT HEAD WITHOUT CONTRAST TECHNIQUE: Contiguous axial images were obtained from the base of the skull through the vertex without intravenous contrast. COMPARISON:  Head CT 01/18/2017 FINDINGS: Brain: No mass lesion, intraparenchymal hemorrhage or extra-axial collection. No evidence of acute cortical infarct. There is periventricular hypoattenuation compatible with chronic microvascular disease. Vascular: No hyperdense vessel  or unexpected calcification. Skull: Normal visualized skull base, calvarium and extracranial soft tissues. Sinuses/Orbits: No sinus fluid levels or advanced mucosal thickening. No mastoid effusion. Normal orbits. IMPRESSION: Chronic microvascular ischemia without acute intracranial abnormality. Electronically Signed   By: Ulyses Jarred M.D.   On: 06/26/2017 15:06   Ct Angio Chest Pe W And/or Wo Contrast  Result Date: 06/26/2017 CLINICAL DATA:  Altered level of consciousness and decreased oxygen saturation today. EXAM: CT ANGIOGRAPHY CHEST WITH CONTRAST TECHNIQUE: Multidetector CT imaging of the chest was performed using the standard protocol during bolus administration of intravenous contrast. Multiplanar CT image reconstructions and MIPs were obtained to evaluate the vascular anatomy. CONTRAST:  100 cc Isovue 370. COMPARISON:  CT chest 01/19/2017. Single-view of the chest earlier today. FINDINGS: Cardiovascular: No pulmonary embolus is identified. There is calcific aortic and coronary atherosclerosis. Cardiomegaly is seen. No pericardial effusion. Contrast refluxes into the inferior vena cava and hepatic veins compatible with right heart insufficiency. Mediastinum/Nodes: No enlarged mediastinal, hilar, or axillary lymph nodes. Thyroid gland, trachea, and esophagus demonstrate no significant  findings. Lungs/Pleura: No pleural effusion. As on the prior CT, the lungs demonstrate extensive ground-glass attenuation with an upper lobe predominance. No nodule or mass is identified. Upper Abdomen: No acute abnormality. Musculoskeletal: Spinal stimulator is in place. There is exaggeration of the normal thoracic kyphosis. Multilevel degenerative disc disease is identified. No fracture. No lytic or sclerotic lesion. Review of the MIP images confirms the above findings. IMPRESSION: Negative for pulmonary embolus. Extensive ground-glass attenuation in the lungs is most worrisome for congestive heart failure given marked cardiomegaly. Aortic Atherosclerosis (ICD10-I70.0). Electronically Signed   By: Inge Rise M.D.   On: 06/26/2017 15:13   Dg Chest Port 1 View Result Date: 06/26/2017 Lower lung volumes. Increased pulmonary interstitial opacity similar to May, suspect acute interstitial edema. Viral/atypical respiratory infection fell less likely. Electronically Signed   By: Genevie Ann M.D.   On: 06/26/2017 12:27      Scheduled Meds: . acidophilus  1 capsule Oral q1800  . calcium-vitamin D  2 tablet Oral Daily  . digoxin  0.0625 mg Oral Daily  . diltiazem  60 mg Oral Q6H  . gabapentin  300 mg Oral BID  . insulin aspart  0-5 Units Subcutaneous QHS  . insulin aspart  0-9 Units Subcutaneous TID WC  . pantoprazole  40 mg Oral Daily  . potassium chloride   20 mEq Oral BID  . torsemide  20 mg Oral Daily  . valACYclovir  1,000 mg Oral Daily   Continuous Infusions: . ceFEPime (MAXIPIME) IV Stopped (06/29/17 1729)     LOS: 4 days    Time spent:25 minutes  Greater than 50% of the time spent on counseling and coordinating the care.   Leisa Lenz, MD [facility] Pager [phone]  If 7PM-7AM, please contact night-coverage [email or web address] Password [email or web address] 06/30/2017, 9:59 AM

## 2017-07-01 DIAGNOSIS — J9621 Acute and chronic respiratory failure with hypoxia: Secondary | ICD-10-CM

## 2017-07-01 DIAGNOSIS — Z515 Encounter for palliative care: Secondary | ICD-10-CM

## 2017-07-01 DIAGNOSIS — R627 Adult failure to thrive: Secondary | ICD-10-CM

## 2017-07-01 LAB — BASIC METABOLIC PANEL
ANION GAP: 9 (ref 5–15)
BUN: 28 mg/dL — ABNORMAL HIGH (ref 6–20)
CALCIUM: 8.5 mg/dL — AB (ref 8.9–10.3)
CO2: 24 mmol/L (ref 22–32)
CREATININE: 1.46 mg/dL — AB (ref 0.44–1.00)
Chloride: 104 mmol/L (ref 101–111)
GFR, EST AFRICAN AMERICAN: 37 mL/min — AB (ref >60)
GFR, EST NON AFRICAN AMERICAN: 32 mL/min — AB (ref >60)
Glucose, Bld: 193 mg/dL — ABNORMAL HIGH (ref 65–99)
Potassium: 4.7 mmol/L (ref 3.5–5.1)
SODIUM: 137 mmol/L (ref 135–145)

## 2017-07-01 LAB — CBC
HEMATOCRIT: 36.9 % (ref 36.0–46.0)
Hemoglobin: 11.2 g/dL — ABNORMAL LOW (ref 12.0–15.0)
MCH: 25.6 pg — ABNORMAL LOW (ref 26.0–34.0)
MCHC: 30.4 g/dL (ref 30.0–36.0)
MCV: 84.4 fL (ref 78.0–100.0)
PLATELETS: 44 10*3/uL — AB (ref 150–400)
RBC: 4.37 MIL/uL (ref 3.87–5.11)
RDW: 16.3 % — AB (ref 11.5–15.5)
WBC: 10.2 10*3/uL (ref 4.0–10.5)

## 2017-07-01 LAB — GLUCOSE, CAPILLARY
GLUCOSE-CAPILLARY: 156 mg/dL — AB (ref 65–99)
GLUCOSE-CAPILLARY: 243 mg/dL — AB (ref 65–99)
GLUCOSE-CAPILLARY: 277 mg/dL — AB (ref 65–99)
Glucose-Capillary: 368 mg/dL — ABNORMAL HIGH (ref 65–99)

## 2017-07-01 MED ORDER — DEXAMETHASONE SODIUM PHOSPHATE 4 MG/ML IJ SOLN: 40.0000 mg | INTRAMUSCULAR | Status: DC

## 2017-07-01 MED ORDER — DEXAMETHASONE SODIUM PHOSPHATE 10 MG/ML IJ SOLN
40.0000 mg | Freq: Every day | INTRAMUSCULAR | Status: AC
  Administered 2017-07-01 – 2017-07-03 (×3): 40 mg via INTRAVENOUS
  Filled 2017-07-01 (×3): qty 4

## 2017-07-01 NOTE — Progress Notes (Signed)
Inpatient Diabetes Program Recommendations  AACE/ADA: New Consensus Statement on Inpatient Glycemic Control (2015)  Target Ranges:  Prepandial:   less than 140 mg/dL      Peak postprandial:   less than 180 mg/dL (1-2 hours)      Critically ill patients:  140 - 180 mg/dL   Lab Results  Component Value Date   GLUCAP 243 (H) 07/01/2017   HGBA1C 5.8 (H) 12/21/2012    Review of Glycemic Control  Post-prandial blood sugars elevated.  Inpatient Diabetes Program Recommendations:   Add Novolog 3 units tidwc for meal coverage insulin.  Will continue to follow.  Thank you. Lorenda Peck, RD, LDN, CDE Inpatient Diabetes Coordinator [phone]

## 2017-07-01 NOTE — Consult Note (Signed)
Consultation Note Date: 07/01/2017   Patient Name: [name] [name]  DOB: 1931-01-25  MRN: [id number]  Age / Sex: 83 y.o., female  PCP: [name] Manes, MD Referring Physician: Robbie Lis, MD  Reason for Consultation: Establishing goals of care and Psychosocial/spiritual support  HPI/Patient Profile: 83 y.o. female  admitted on 10/10/2018past  medical history significant for paroxysmal atrial fibrillation not on anticoagulation, coronary artery disease with history of stents, chronic diastolic CHF, hypertension,and chronic ITP, presented  to the emergency department for evaluation of altered mental status.   She is a resident of [facility]  She had presented to the [facility] for first cycle of Rituxan, but was found to be very fatigued and confused. She was hypoxic with saturation of 79% on room air, with caregiver reporting that they forgot to bring her oxygen tank.   She was treated with supplemental oxygen with improvement in her saturations, but she continued to be altered and lethargic and was directed to the ED for further evaluation.   Per the patient she has had slow physical and functional decline and lives in the skilled area of her facility, she is wheelchair bound.  She faces advanced directive decisions and anticipatory care needs.  Her only son lives in the [place] part of the state, and most of their communication is by phone.  ( I was unable to contact him or leave a message today)  I was able to get in touch with her friend [name] [name] , and she will try to contact son.  It will be beneficial to have a meeting to include patient and her son.   Clinical Assessment and Goals of Care:  This NP [name] [name] reviewed medical records, received report from team, assessed the patient and then meet at the patient's to discuss diagnosis, prognosis, GOC, EOL wishes disposition and  options.  A discussion was had today regarding advanced directives.    The difference between a aggressive medical intervention path  and a palliative comfort care path for this patient at this time was had.  Values and goals of care important to patient  were attempted to be elicited.  Concept of  Palliative Care was discussed  Questions and concerns addressed.   Patient encouraged to call with questions or concerns.  PMT will continue to support holistically.   NEXT OF KIN is primary decision maker but there is no documented HPOA    SUMMARY OF RECOMMENDATIONS     Code Status/Advance Care Planning:  DNR   Palliative Prophylaxis:   Aspiration, Bowel Regimen, Delirium Protocol, Frequent Pain Assessment and Oral Care  Additional Recommendations (Limitations, Scope, Preferences):  Full Scope Treatment  Psycho-social/Spiritual:   Desire for further Chaplaincy support:no  Prognosis:   Unable to determine  Discharge Planning: [facility] for rehab with Palliative care service follow-up      Primary Diagnoses: Present on Admission: . Acute encephalopathy . Acute on chronic diastolic CHF (congestive heart failure) ([place]) . CAD (coronary artery disease) of artery bypass graft .  Chronic ITP (idiopathic thrombocytopenia) (HCC) . Chronic narcotic dependence ([place]) . Chronic respiratory failure with hypoxia ([place]) . HTN (hypertension) . Thrombocytopenia ([place]) . CKD (chronic kidney disease), stage III ([place])   I have reviewed the medical record, interviewed the patient and family, and examined the patient. The following aspects are pertinent.  Past Medical History:  Diagnosis Date  . Atrial fibrillation ([place])   . CAD (coronary artery disease)    a. NSTEMI 2/14 => LHC 10/20/12:  dLM 20%, pLAD 99%, mLAD 60% followed by 99%, oD1 99%, and pD2 30%, mild plaque disease in the CFX and RCA. PCI: Xience Xpedition DES to the proximal and mid LAD  . Chronic diastolic CHF  (congestive heart failure) ([place])    Echo (08/10/13): Mild LVH, EF 60-65%.  . DJD (degenerative joint disease)   . Dyslipidemia   . HTN (hypertension)   . Osteopenia   . Thrombocytopenia ([place])   . Venous ulcer ([place])    Social History   Social History  . Marital status: Divorced    Spouse name: N/A  . Number of children: 2  . Years of education: N/A   Occupational History  .  Retired   Social History Main Topics  . Smoking status: Never Smoker  . Smokeless tobacco: Never Used  . Alcohol use No  . Drug use: No  . Sexual activity: Not Asked   Other Topics Concern  . None   Social History Narrative   Lives at [facility].  Two adopted children. Divorced.     Family History  Problem Relation Age of Onset  . CAD Father 39       Vague history    Scheduled Meds: . acidophilus  1 capsule Oral q1800  . calcium-vitamin D  2 tablet Oral Daily  . Chlorhexidine Gluconate Cloth  6 each Topical Q0600  . digoxin  0.0625 mg Oral Daily  . diltiazem  60 mg Oral Q6H  . gabapentin  300 mg Oral BID  . insulin aspart  0-5 Units Subcutaneous QHS  . insulin aspart  0-9 Units Subcutaneous TID WC  . mouth rinse  15 mL Mouth Rinse BID  . mupirocin ointment  1 application Nasal BID  . pantoprazole  40 mg Oral Daily  . polyvinyl alcohol  2 drop Both Eyes BID  . potassium chloride SA  20 mEq Oral BID  . sodium chloride  1 spray Each Nare TID  . sodium chloride flush  3 mL Intravenous Q12H  . torsemide  20 mg Oral Daily  . valACYclovir  1,000 mg Oral Daily   Continuous Infusions: . ceFEPime (MAXIPIME) IV Stopped (06/30/17 1904)  . dexamethasone (DECADRON) IVPB [facility]     PRN Meds:.acetaminophen **OR** acetaminophen, albuterol, bisacodyl, ondansetron **OR** ondansetron (ZOFRAN) IV, senna-docusate, traMADol Medications Prior to Admission:  Prior to Admission medications   Medication Sig Start Date End Date Taking? Authorizing Provider  albuterol (PROVENTIL) (2.5 MG/3ML) 0.083% nebulizer  solution Take 2.5 mg by nebulization every 6 (six) hours as needed for wheezing or shortness of breath.   Yes [provider]  Calcium Carbonate-Vitamin D (CALCIUM 600+D) 600-200 MG-UNIT TABS Take 2 tablets by mouth daily.   Yes [provider]  digoxin (LANOXIN) 0.125 MG tablet Take 0.0625 mg by mouth daily.   Yes [provider]  diltiazem (CARDIZEM CD) 240 MG 24 hr capsule Take 1 capsule (240 mg total) by mouth daily. 08/18/13  Yes Velvet Bathe, MD  famciclovir (FAMVIR) 250 MG tablet Take 1 tablet (  250 mg total) by mouth daily. 08/03/16  Yes [place], Holli Humbles, NP  famotidine (PEPCID) 20 MG tablet Take 20 mg by mouth 2 (two) times daily.  06/24/17  Yes [provider]  gabapentin (NEURONTIN) 300 MG capsule Take 300 mg by mouth 2 (two) times daily.  05/29/17  Yes [provider]  glipiZIDE (GLUCOTROL XL) 2.5 MG 24 hr tablet Take 2.5 mg by mouth daily with breakfast.  05/31/17  Yes [provider]  insulin lispro (HUMALOG) 100 UNIT/ML injection Inject 0-15 Units into the skin 3 (three) times daily as needed for high blood sugar. Pt uses as needed per sliding scale:  70-120:  0 units, 121-150:  2 units, 151-200:  3 units, 201-250:  5 units, 251-300:  8 units, 301-350:  11 units, 351-400:  15 units, Greater than 400:  Call MD   Yes [provider]  LEVEMIR 100 UNIT/ML injection Inject 6 Units into the skin at bedtime.  05/17/17  Yes [provider]  metoprolol (LOPRESSOR) 100 MG tablet Take 1 tablet (100 mg total) by mouth 2 (two) times daily. 08/18/13  Yes Velvet Bathe, MD  Oxycodone HCl 10 MG TABS Take 10 mg by mouth every 6 (six) hours as needed (pain).   Yes [provider]  pantoprazole (PROTONIX) 40 MG tablet Take 1 tablet (40 mg total) by mouth daily. 08/03/16  Yes [place], Holli Humbles, NP  Polyethyl Glycol-Propyl Glycol (SYSTANE) 0.4-0.3 % SOLN Place 2 drops into both eyes 2 (two) times daily.   Yes [provider]  potassium chloride SA (K-DUR,KLOR-CON) 20 MEQ tablet Take 1 tablet (20 mEq total) by mouth 2 (two) times daily. 08/28/13  Yes [place], Scott T, PA-C  Probiotic Product (RISA-BID PROBIOTIC PO) Take 1 tablet by mouth daily.   Yes [provider]  sitaGLIPtin (JANUVIA) 50 MG tablet Take 50 mg by mouth daily.   Yes [provider]  sodium chloride (OCEAN) 0.65 % SOLN nasal spray Place 1 spray into both nostrils 3 (three) times daily.    Yes [provider]  torsemide (DEMADEX) 20 MG tablet Take 40 mg by mouth daily.  05/29/17  Yes [provider]  traMADol (ULTRAM) 50 MG tablet Take 50 mg by mouth 3 (three) times daily.  03/04/17  Yes [provider]   Allergies  Allergen Reactions  . Keflex [Cephalexin] Other (See Comments)    Reaction:  Unknown    Review of Systems  Constitutional: Positive for fatigue.  Neurological: Positive for weakness.    Physical Exam  Constitutional: She is oriented to person, place, and time. She appears well-developed. She appears ill. Nasal cannula in place.  Cardiovascular: Normal rate, regular rhythm and normal heart sounds.   Pulmonary/Chest: She has decreased breath sounds.  Musculoskeletal:  Generalized weakness   Neurological: She is alert and oriented to person, place, and time.  Skin: Skin is warm and dry.    Vital Signs: BP (!) 159/79 (BP Location: Left Arm)   Pulse 92   Temp (!) 97.5 F (36.4 C) (Oral)   Resp 18   Ht _0  (1.626 m)   Wt 88.9 kg (195 lb 15.8 oz)   SpO2 95%   BMI 33.64 kg/m  Pain Assessment: 0-10   Pain Score: 6    SpO2: SpO2: 95 % O2 Device:SpO2: 95 % O2 Flow Rate: .O2 Flow Rate (L/min): 2 L/min  IO: Intake/output summary:  Intake/Output Summary (Last 24 hours) at 07/01/17 0944 Last data filed at  07/01/17 0850  Gross per 24 hour  Intake              750 ml  Output                0 ml  Net              750 ml    LBM: Last BM Date: 06/30/17 Baseline Weight: Weight:  89.4 kg (197 lb) Most recent weight: Weight: 88.9 kg (195 lb 15.8 oz)      Palliative Assessment/Data: 30%    Discussed with Dr Charlies Silvers  Time In: 0800 Time Out: 0915 Time Total: 75 min Greater than 50%  of this time was spent counseling and coordinating care related to the above assessment and plan.  Signed by: [name] Lessen, NP   Please contact Palliative Medicine Team phone at [phone] for questions and concerns.  For individual provider: See Shea Evans

## 2017-07-01 NOTE — Progress Notes (Signed)
Patient ID: [name] [name], female   DOB: Apr 15, 1931, 83 y.o.   MRN: [id number]  PROGRESS NOTE    [name] [name]  WUJ:[id number] DOB: Feb 03, 1931 DOA: 06/26/2017  PCP: Lajean Manes, MD   Brief Narrative:   83 year old female with paroxysmal a fib not on AC, CAD with stents, chronic diastolic CHF, hypertension, chronic ITP who presented to ED with altered mental status and hypoxia. Even with supplemental oxygen she remained confused.   Assessment & Plan:   Principal Problem:   Acute metabolic encephalopathy - Perhaps due to UTI - CT scan showed chronic microvascular ischemia - CT angio negative for PE - No significant changes ine mental status   Active Problems:   UTI - Urinalysis showed too numerous to count white blood cells - Urine culture with multiple species none predominant - Patient is on cefepime    Acute on chronic diastolic CHF (congestive heart failure) ([place]) / Acute respiratory failure with hypoxia - Pt noted to have pulmonary edema on imagining - CTA showed extensive ground glass attenuation worrisome for CHF - continue torsemide, Cardizem and digoxin    Right upper extremity nonocclusive DVT - Seen on Doppler 06/30/2017 - Not on anticoagulation due to thrombocytopenia    Paroxysmal Afib - CHA2DS2-VASc Score is 4 - Continue Cardizem and digoxin - Patient is not on anticoagulation due to thrombocytopenia    Hypokalemia - Due to torsemide - Supplemented - Follow up BMP in am    Chronic ITP (idiopathic thrombocytopenia) (HCC) - Oncology changed to Solu-Medrol to Decadron 40 mg IV daily - Platelet count 44 this morning    CKD (chronic kidney disease), stage III (HCC) - Creatinine within baseline range   DVT prophylaxis: SCD's Code Status:DNR/DNI  Family Communication: no family at the bedside Disposition Plan: home once platelets are stable   Consultants:   Oncology   Procedures:   Right upper extremity Doppler 06/30/2017 -  nonocclusive DVT in right upper extremity  Antimicrobials:   Cefepime 06/26/2017 -->   Subjective: No overnight events.  Objective: Vitals:   06/30/17 2153 06/30/17 2359 07/01/17 0500 07/01/17 0621  BP: (!) 125/59 (!) 158/71  (!) 159/79  Pulse: 82   92  Resp: 20   18  Temp: 97.8 F (36.6 C)   (!) 97.5 F (36.4 C)  TempSrc: Oral   Oral  SpO2: 98%   95%  Weight:   88.9 kg (195 lb 15.8 oz)   Height:        Intake/Output Summary (Last 24 hours) at 07/01/17 0828 Last data filed at 07/01/17 0600  Gross per 24 hour  Intake              870 ml  Output                0 ml  Net              870 ml   Filed Weights   06/29/17 0627 06/30/17 0538 07/01/17 0500  Weight: 87.9 kg (193 lb 12.6 oz) 88.2 kg (194 lb 7.1 oz) 88.9 kg (195 lb 15.8 oz)    Examination:  General exam: Appears calm and comfortable  Respiratory system: Clear to auscultation. Respiratory effort normal. Cardiovascular system: S1 & S2 heard, rate controlled Gastrointestinal system: Abdomen is nondistended, soft and nontender. No organomegaly or masses felt. Normal bowel sounds heard. Central nervous system: Alert but disoriented Extremities: +2 lower extremity pitting edema, pulses palpable Skin: warm, dry Psychiatry: normal mood and behavior  Data Reviewed: I have personally reviewed following labs and imaging studies  CBC:  Recent Labs Lab 06/26/17 1005 06/27/17 0406 06/27/17 1447 06/29/17 0324 06/30/17 0315 07/01/17 0511  WBC 9.4 7.8 8.9 7.4 2.9* 10.2  NEUTROABS 7.0*  --   --   --   --   --   HGB 12.2 10.9* 11.3* 11.0* 11.9* 11.2*  HCT 40.2 35.8* 37.6 36.5 38.4 36.9  MCV 87 86.3 86.6 85.5 84.6 84.4  PLT 21 Platelet count consistent in citrate* 15* 29* 15* 13* 44*   Basic Metabolic Panel:  Recent Labs Lab 06/27/17 0406 06/28/17 0329 06/29/17 0324 06/30/17 0315 07/01/17 0511  NA 143 142 142 140 137  K 3.5 3.3* 3.3* 4.1 4.7  CL 103 102 105 105 104  CO2 _0 GLUCOSE 81 92 88  253* 193*  BUN 28* 24* 19 20 28*  CREATININE 1.28* 1.23* 1.00 1.07* 1.46*  CALCIUM 8.3* 8.5* 8.5* 8.6* 8.5*   GFR: Estimated Creatinine Clearance: 31 mL/min (A) (by C-G formula based on SCr of 1.46 mg/dL (H)). Liver Function Tests:  Recent Labs Lab 06/26/17 1005  AST 14  ALT 13  ALKPHOS 99*  BILITOT 0.70  PROT 6.1*  ALBUMIN 2.4*   No results for input(s): LIPASE, AMYLASE in the last 168 hours. No results for input(s): AMMONIA in the last 168 hours. Coagulation Profile: No results for input(s): INR, PROTIME in the last 168 hours. Cardiac Enzymes:  Recent Labs Lab 06/26/17 1308  TROPONINI <0.03   BNP (last 3 results) No results for input(s): PROBNP in the last 8760 hours. HbA1C: No results for input(s): HGBA1C in the last 72 hours. CBG:  Recent Labs Lab 06/30/17 0827 06/30/17 1146 06/30/17 1647 06/30/17 2159 07/01/17 0750  GLUCAP 186* 279* 224* 265* 156*   Lipid Profile: No results for input(s): CHOL, HDL, LDLCALC, TRIG, CHOLHDL, LDLDIRECT in the last 72 hours. Thyroid Function Tests: No results for input(s): TSH, T4TOTAL, FREET4, T3FREE, THYROIDAB in the last 72 hours. Anemia Panel: No results for input(s): VITAMINB12, FOLATE, FERRITIN, TIBC, IRON, RETICCTPCT in the last 72 hours. Urine analysis:    Component Value Date/Time   COLORURINE YELLOW 06/26/2017 1400   APPEARANCEUR CLOUDY (A) 06/26/2017 1400   LABSPEC 1.010 06/26/2017 1400   PHURINE 6.0 06/26/2017 1400   GLUCOSEU NEGATIVE 06/26/2017 1400   HGBUR LARGE (A) 06/26/2017 1400   BILIRUBINUR NEGATIVE 06/26/2017 1400   KETONESUR NEGATIVE 06/26/2017 1400   PROTEINUR NEGATIVE 06/26/2017 1400   UROBILINOGEN 0.2 08/10/2013 0037   NITRITE POSITIVE (A) 06/26/2017 1400   LEUKOCYTESUR LARGE (A) 06/26/2017 1400   Sepsis Labs: _1 (procalcitonin:4,lacticidven:4)   ) Recent Results (from the past 240 hour(s))  Urine culture     Status: Abnormal   Collection Time: 06/26/17  2:00 PM  Result Value  Ref Range Status   Specimen Description URINE, RANDOM  Final   Special Requests NONE  Final   Culture MULTIPLE SPECIES PRESENT, SUGGEST RECOLLECTION (A)  Final   Report Status 06/27/2017 FINAL  Final  MRSA PCR Screening     Status: Abnormal   Collection Time: 06/26/17  9:14 PM  Result Value Ref Range Status   MRSA by PCR POSITIVE (A) NEGATIVE Final    Comment:        The GeneXpert MRSA Assay (FDA approved for NASAL specimens only), is one component of a comprehensive MRSA colonization surveillance program. It is not intended to diagnose MRSA infection nor to guide or monitor treatment for MRSA  infections. RESULT CALLED TO, READ BACK BY AND VERIFIED WITH: Levada Schilling [id number] @ [place]       Radiology Studies: No results found.      Scheduled Meds: . acidophilus  1 capsule Oral q1800  . calcium-vitamin D  2 tablet Oral Daily  . Chlorhexidine Gluconate Cloth  6 each Topical Q0600  . digoxin  0.0625 mg Oral Daily  . diltiazem  60 mg Oral Q6H  . gabapentin  300 mg Oral BID  . insulin aspart  0-5 Units Subcutaneous QHS  . insulin aspart  0-9 Units Subcutaneous TID WC  . mouth rinse  15 mL Mouth Rinse BID  . mupirocin ointment  1 application Nasal BID  . pantoprazole  40 mg Oral Daily  . polyvinyl alcohol  2 drop Both Eyes BID  . potassium chloride SA  20 mEq Oral BID  . sodium chloride  1 spray Each Nare TID  . sodium chloride flush  3 mL Intravenous Q12H  . torsemide  20 mg Oral Daily  . valACYclovir  1,000 mg Oral Daily   Continuous Infusions: . ceFEPime (MAXIPIME) IV Stopped (06/30/17 1904)  . dexamethasone (DECADRON) IVPB [facility]       LOS: 5 days    Time spent: 25 minutes  Greater than 50% of the time spent on counseling and coordinating the care.   Leisa Lenz, MD [facility] Pager [phone]  If 7PM-7AM, please contact night-coverage [email or web address] Password [email or web address] 07/01/2017, 8:28 AM

## 2017-07-01 NOTE — Evaluation (Signed)
Physical Therapy Evaluation Patient Details Name: [name] [name] MRN: [id number] DOB: 08/04/1931 Today's Date: 07/01/2017   History of Present Illness  83 year old female with PMHx of paroxysmal a fib not on AC, CAD with stents, chronic diastolic CHF, hypertension, chronic ITP and admitted with altered mental status and hypoxia  Clinical Impression  Pt admitted with above diagnosis. Pt currently with functional limitations due to the deficits listed below (see PT Problem List).  Pt will benefit from skilled PT to increase their independence and safety with mobility to allow discharge to the venue listed below.   Pt is a resident of [facility] and unable to state how much staff assist her with bed to w/c transfers.  Pt is possibly close to her baseline.  Recommend staff use lift if assisting pt OOB.  Will defer further PT needs to SNF.  Will check back on pt if she remains in acute care.     Follow Up Recommendations SNF    Equipment Recommendations  None recommended by PT    Recommendations for Other Services       Precautions / Restrictions Precautions Precautions: Fall      Mobility  Bed Mobility Overal bed mobility: Needs Assistance Bed Mobility: Supine to Sit;Sit to Supine     Supine to sit: Total assist;HOB elevated Sit to supine: Total assist   General bed mobility comments: pt requiring assist for upper and lower body, assisted with slowly inching LEs over EOB, utilized bed pad for positioning  Transfers                 General transfer comment: NT - pt requiring total assist for bed mobility  Ambulation/Gait                Stairs            Wheelchair Mobility    Modified Rankin (Stroke Patients Only)       Balance Overall balance assessment: Needs assistance Sitting-balance support: Bilateral upper extremity supported;Feet supported Sitting balance-Leahy Scale: Poor Sitting balance - Comments: required UE support however no  external assist provided, able to sit EOB for approx 5 minutes                                     Pertinent Vitals/Pain Pain Assessment: Faces Faces Pain Scale: Hurts a little bit Pain Location: reports lower extremities tender to touch Pain Intervention(s): Limited activity within patient's tolerance;Monitored during session    Home Living Family/patient expects to be discharged to:: Skilled nursing facility                      Prior Function Level of Independence: Needs assistance   Gait / Transfers Assistance Needed: bed and w/c bound at baseline. Pt resides at [facility]. Staff assist with stand-pivot transfers to toilet and w/c.           Hand Dominance        Extremity/Trunk Assessment        Lower Extremity Assessment Lower Extremity Assessment: Generalized weakness       Communication   Communication: No difficulties  Cognition Arousal/Alertness: Awake/alert Behavior During Therapy: WFL for tasks assessed/performed Overall Cognitive Status: No family/caregiver present to determine baseline cognitive functioning  General Comments: RN reports pt slow to respond at times, pt reading a book upon entering room, does not appear confused at this time, however unable to state previous level of staff at [facility]     Assessment/Plan    PT Assessment Patient needs continued PT services  PT Problem List Decreased strength;Decreased mobility;Decreased activity tolerance;Decreased balance       PT Treatment Interventions DME instruction;Therapeutic activities;Functional mobility training;Therapeutic exercise;Patient/family education;Wheelchair mobility training    PT Goals (Current goals can be found in the Care Plan section)  Acute Rehab PT Goals PT Goal Formulation: With patient Time For Goal Achievement: 07/15/17 Potential to Achieve  Goals: Fair    Frequency Min 1X/week   Barriers to discharge        Co-evaluation               AM-PAC PT "6 Clicks" Daily Activity  Outcome Measure Difficulty turning over in bed (including adjusting bedclothes, sheets and blankets)?: Unable Difficulty moving from lying on back to sitting on the side of the bed? : Unable Difficulty sitting down on and standing up from a chair with arms (e.g., wheelchair, bedside commode, etc,.)?: Unable Help needed moving to and from a bed to chair (including a wheelchair)?: Total Help needed walking in hospital room?: Total Help needed climbing 3-5 steps with a railing? : Total 6 Click Score: 6    End of Session   Activity Tolerance: Patient tolerated treatment well Patient left: in bed;with bed alarm set;with call bell/phone within reach Nurse Communication: Mobility status PT Visit Diagnosis: Muscle weakness (generalized) (M62.81)    Time: 1828-8337 PT Time Calculation (min) (ACUTE ONLY): 16 min   Charges:   PT Evaluation $PT Eval Low Complexity: 1 Low     PT G CodesCarmelia Bake, PT, DPT 07/01/2017 Pager: [phone]  York Ram E 07/01/2017, 1:16 PM

## 2017-07-02 DIAGNOSIS — R627 Adult failure to thrive: Secondary | ICD-10-CM

## 2017-07-02 DIAGNOSIS — Z66 Do not resuscitate: Secondary | ICD-10-CM

## 2017-07-02 LAB — CBC
HCT: 38.7 % (ref 36.0–46.0)
Hemoglobin: 11.6 g/dL — ABNORMAL LOW (ref 12.0–15.0)
MCH: 25.7 pg — ABNORMAL LOW (ref 26.0–34.0)
MCHC: 30 g/dL (ref 30.0–36.0)
MCV: 85.6 fL (ref 78.0–100.0)
Platelets: 82 10*3/uL — ABNORMAL LOW (ref 150–400)
RBC: 4.52 MIL/uL (ref 3.87–5.11)
RDW: 16.2 % — ABNORMAL HIGH (ref 11.5–15.5)
WBC: 8.8 10*3/uL (ref 4.0–10.5)

## 2017-07-02 LAB — BASIC METABOLIC PANEL
Anion gap: 9 (ref 5–15)
BUN: 38 mg/dL — ABNORMAL HIGH (ref 6–20)
CO2: 24 mmol/L (ref 22–32)
Calcium: 8.7 mg/dL — ABNORMAL LOW (ref 8.9–10.3)
Chloride: 106 mmol/L (ref 101–111)
Creatinine, Ser: 1.21 mg/dL — ABNORMAL HIGH (ref 0.44–1.00)
GFR calc Af Amer: 46 mL/min — ABNORMAL LOW (ref >60)
GFR calc non Af Amer: 40 mL/min — ABNORMAL LOW (ref >60)
Glucose, Bld: 222 mg/dL — ABNORMAL HIGH (ref 65–99)
Potassium: 4.6 mmol/L (ref 3.5–5.1)
Sodium: 139 mmol/L (ref 135–145)

## 2017-07-02 LAB — GLUCOSE, CAPILLARY
GLUCOSE-CAPILLARY: 254 mg/dL — AB (ref 65–99)
Glucose-Capillary: 212 mg/dL — ABNORMAL HIGH (ref 65–99)
Glucose-Capillary: 283 mg/dL — ABNORMAL HIGH (ref 65–99)
Glucose-Capillary: 220 mg/dL — ABNORMAL HIGH (ref 65–99)

## 2017-07-02 MED ORDER — INSULIN ASPART 100 UNIT/ML ~~LOC~~ SOLN
3.0000 [IU] | Freq: Three times a day (TID) | SUBCUTANEOUS | Status: DC
  Administered 2017-07-02 – 2017-07-04 (×7): 3 [IU] via SUBCUTANEOUS

## 2017-07-02 NOTE — Progress Notes (Signed)
Patient ID: Scheryl Marten, female   DOB: June 29, 1931, 83 y.o.   MRN: [id number]  This NP visited patient at the bedside as a follow up to  yesterday's [place].   Patient is alert and oriented, we discussed noted skin breakdown per nursing and I requested a wound care consult.  Patient and I continues to conversation regarding the concept of failure to thrive and the significance of in mobility and overall health and wellness.    Received call back from son/Allen Bidinger, I discussed his mother's medical condition and the importance of continued conversation between family and their medical providers regarding overall plan of care and treatment options,  ensuring decisions are within the context of the patients values and GOCs.  Encourage documentation of healthcare power of attorney and advanced directives. I educated both patient and her son is spiritual care department could help with that documentation while she is here in the hospital.  Questions and concerns addressed  Time in  1150         Time out    1225   Total time spent on the unit was 35 minutes  Greater than 50% of the time was spent in counseling and coordination of care  Wadie Lessen NP  Palliative Medicine Team Team Phone # [phone] Team phone  [phone]

## 2017-07-02 NOTE — Consult Note (Signed)
[facility] Nurse wound consult note Reason for Consult: Partial thickness area of tissue loss secondary to incontinence. I am assisted in my assessment today by WTA P. Armstrong and her input is appreciated. Area of moisture associated skin damage (IAD) noted at left lower buttock. Wound type: moisture Pressure Injury POA: NA Measurement: 1cm x 0.8cm x 0.2cm  Wound NZV:JKQASU slough consistent with inflammation, scattered red with basement membrane evident Drainage (amount, consistency, odor) none Periwound: erythematous, edematous, macerated with mild odor of urine Dressing procedure/placement/frequency:Patient with UI and incontinence of bowel. Comorbid conditions such as arterial insufficiency and low platelets and history of falls require that she be in a low bed for safety. Mattress replacement with low air loss feature may be helpful, but cannot be placed on a low bed. Instead, we will turn and reposition from side to side, provide heel pressure redistribution boots and limit the supine position for meals only. Timely incontinence care will be provided using our house skin cleansers (pH balanced, no rinse) and be followed by application of our moisture barrier ointment, Critic Aid Clear. It should be noted that while in house, patient will be provided with a powered external female urinary collection device (PurWick) to reduce but not eliminate) the assaults of urinary incontinence. [facility] nursing team will not follow, but will remain available to this patient, the nursing and medical teams.  Please re-consult if needed. Thanks, Maudie Flakes, MSN, RN, [place], Arther Abbott  Pager# [phone]

## 2017-07-02 NOTE — Progress Notes (Signed)
The patient is a long term care resident at [facility] updated facility. CSW will continue to follow for patient discharge needs back to SNF.   Kathrin Greathouse, Latanya Presser, MSW Clinical Social Worker  [phone] 07/02/2017  9:38 AM

## 2017-07-02 NOTE — Progress Notes (Signed)
Patient was transferred to 1532. Report was given to Vira Agar, Therapist, sports. Patient was cooperative and all belongings were gathered, including eyeglasses, pictures, book, and other belongings. All questions were answered and patient was stable at time of transfer.

## 2017-07-02 NOTE — Care Management Important Message (Addendum)
Important Message  Patient Details IM Letter given to Suzanne/Case Manager to present to Patient Name: [name] [name] MRN: [id number] Date of Birth: 14-Mar-1931   Medicare Important Message Given:  Yes    Kerin Salen 07/02/2017, 10:08 AMImportant Message  Patient Details  Name: [name] [name] MRN: [id number] Date of Birth: [birth year]   Medicare Important Message Given:  Yes    Kerin Salen 07/02/2017, 10:08 AM

## 2017-07-02 NOTE — Progress Notes (Signed)
Patient ID: [name] [name], female   DOB: 09/16/1931, 83 y.o.   MRN: [id number]  PROGRESS NOTE    [name] [name]  OVF:[id number] DOB: December 31, 1930 DOA: 06/26/2017  PCP: Lajean Manes, MD   Brief Narrative:   83 year old female with paroxysmal a fib not on AC, CAD with stents, chronic diastolic CHF, hypertension, chronic ITP who presented to ED with altered mental status and hypoxia. Even with supplemental oxygen she remained confused.   Assessment & Plan:   Principal Problem:   Acute metabolic encephalopathy - likely due to UTI - CT scan showed chronic microvascular ischemia - CT angiogram negative for pulmonary embolism - Better mental status this morning,Oriented to place and person  Active Problems:   UTI - Urinalysis showed too numerous to count white blood cells - Urine culture with multiple species none predominant - Patient has completed total of 7 days of cefepime, will stop it today    Acute on chronic diastolic CHF (congestive heart failure) ([place]) / Acute respiratory failure with hypoxia - Pt noted to have pulmonary edema on imagining - CTA showed extensive ground glass attenuation worrisome for CHF - continue torsemide, Cardizem and digoxin    Right upper extremity nonocclusive DVT - Seen on Doppler 06/30/2017 - not on anticoagulation due to thrombocytopenia    Paroxysmal Afib - CHA2DS2-VASc Score is 4 - continue Cardizem and digoxin - not on anticoagulation due to thrombocytopenia    Hypokalemia - Due to torsemide - supplemented and within normal limits    Chronic ITP (idiopathic thrombocytopenia) ([place]) - started Decadron 07/01/2017 and patient will complete a total of 3 doses which should and 07/03/2017 - Her platelet count is already improving and it is 82 today    CKD (chronic kidney disease), stage III (HCC) - creatinine is within baseline range   DVT prophylaxis: SCDs Code Status:DNR/DNI  Family Communication: no family at the  bedside Disposition Plan: discharge after she completes 3 doses of Decadron 07/03/2017   Consultants:   Oncology   Procedures:   Right upper extremity Doppler 06/30/2017 - nonocclusive DVT in right upper extremity  Antimicrobials:   Cefepime 06/26/2017 --> 07/02/2017   Subjective: No overnight events.  Objective: Vitals:   07/01/17 2003 07/02/17 0032 07/02/17 0500 07/02/17 0525  BP: (!) 154/84 (!) 154/69  (!) 148/61  Pulse: 91 88  81  Resp: 20 20    Temp: 97.6 F (36.4 C) 97.6 F (36.4 C)  (!) 97.5 F (36.4 C)  TempSrc: Oral Oral  Axillary  SpO2: 96% 99%  99%  Weight:  91.2 kg (201 lb 1 oz) 95.2 kg (209 lb 14.1 oz)   Height:  _0  (1.626 m)      Intake/Output Summary (Last 24 hours) at 07/02/17 0852 Last data filed at 07/02/17 0528  Gross per 24 hour  Intake             1064 ml  Output             1200 ml  Net             -136 ml   Filed Weights   07/01/17 0500 07/02/17 0032 07/02/17 0500  Weight: 88.9 kg (195 lb 15.8 oz) 91.2 kg (201 lb 1 oz) 95.2 kg (209 lb 14.1 oz)    Physical Exam  Constitutional: Appears well-developed and well-nourished. No distress.  CVS: Rate controlled, S1/S2 + Pulmonary: Effort and breath sounds normal, no stridor, rhonchi, wheezes, rales.  Abdominal: Soft. BS +,  no distension, tenderness, rebound or guarding.  Musculoskeletal: Palpable pulses, (+) 1-2 LE edema  Lymphadenopathy: No lymphadenopathy noted, cervical, inguinal. Neuro: Alert. No cranial nerve deficit. Skin: Skin is warm and dry. Psychiatric: Normal mood and affect.     Data Reviewed: I have personally reviewed following labs and imaging studies  CBC:  Recent Labs Lab 06/26/17 1005  06/27/17 1447 06/29/17 0324 06/30/17 0315 07/01/17 0511 07/02/17 0529  WBC 9.4  < > 8.9 7.4 2.9* 10.2 8.8  NEUTROABS 7.0*  --   --   --   --   --   --   HGB 12.2  < > 11.3* 11.0* 11.9* 11.2* 11.6*  HCT 40.2  < > 37.6 36.5 38.4 36.9 38.7  MCV 87  < > 86.6 85.5 84.6 84.4  85.6  PLT 21 Platelet count consistent in citrate*  < > 29* 15* 13* 44* 82*  < > = values in this interval not displayed. Basic Metabolic Panel:  Recent Labs Lab 06/28/17 0329 06/29/17 0324 06/30/17 0315 07/01/17 0511 07/02/17 0529  NA 142 142 140 137 139  K 3.3* 3.3* 4.1 4.7 4.6  CL 102 105 105 104 106  CO2 _0 GLUCOSE 92 88 253* 193* 222*  BUN 24* 19 20 28* 38*  CREATININE 1.23* 1.00 1.07* 1.46* 1.21*  CALCIUM 8.5* 8.5* 8.6* 8.5* 8.7*   GFR: Estimated Creatinine Clearance: 38.7 mL/min (A) (by C-G formula based on SCr of 1.21 mg/dL (H)). Liver Function Tests:  Recent Labs Lab 06/26/17 1005  AST 14  ALT 13  ALKPHOS 99*  BILITOT 0.70  PROT 6.1*  ALBUMIN 2.4*   No results for input(s): LIPASE, AMYLASE in the last 168 hours. No results for input(s): AMMONIA in the last 168 hours. Coagulation Profile: No results for input(s): INR, PROTIME in the last 168 hours. Cardiac Enzymes:  Recent Labs Lab 06/26/17 1308  TROPONINI <0.03   BNP (last 3 results) No results for input(s): PROBNP in the last 8760 hours. HbA1C: No results for input(s): HGBA1C in the last 72 hours. CBG:  Recent Labs Lab 07/01/17 0750 07/01/17 1212 07/01/17 1654 07/01/17 2001 07/02/17 0825  GLUCAP 156* 243* 277* 368* 212*   Lipid Profile: No results for input(s): CHOL, HDL, LDLCALC, TRIG, CHOLHDL, LDLDIRECT in the last 72 hours. Thyroid Function Tests: No results for input(s): TSH, T4TOTAL, FREET4, T3FREE, THYROIDAB in the last 72 hours. Anemia Panel: No results for input(s): VITAMINB12, FOLATE, FERRITIN, TIBC, IRON, RETICCTPCT in the last 72 hours. Urine analysis:    Component Value Date/Time   COLORURINE YELLOW 06/26/2017 1400   APPEARANCEUR CLOUDY (A) 06/26/2017 1400   LABSPEC 1.010 06/26/2017 1400   PHURINE 6.0 06/26/2017 1400   GLUCOSEU NEGATIVE 06/26/2017 1400   HGBUR LARGE (A) 06/26/2017 1400   BILIRUBINUR NEGATIVE 06/26/2017 1400   KETONESUR NEGATIVE 06/26/2017  1400   PROTEINUR NEGATIVE 06/26/2017 1400   UROBILINOGEN 0.2 08/10/2013 0037   NITRITE POSITIVE (A) 06/26/2017 1400   LEUKOCYTESUR LARGE (A) 06/26/2017 1400   Sepsis Labs: _1 (procalcitonin:4,lacticidven:4)   ) Recent Results (from the past 240 hour(s))  Urine culture     Status: Abnormal   Collection Time: 06/26/17  2:00 PM  Result Value Ref Range Status   Specimen Description URINE, RANDOM  Final   Special Requests NONE  Final   Culture MULTIPLE SPECIES PRESENT, SUGGEST RECOLLECTION (A)  Final   Report Status 06/27/2017 FINAL  Final  MRSA PCR Screening     Status: Abnormal  Collection Time: 06/26/17  9:14 PM  Result Value Ref Range Status   MRSA by PCR POSITIVE (A) NEGATIVE Final    Comment:        The GeneXpert MRSA Assay (FDA approved for NASAL specimens only), is one component of a comprehensive MRSA colonization surveillance program. It is not intended to diagnose MRSA infection nor to guide or monitor treatment for MRSA infections. RESULT CALLED TO, READ BACK BY AND VERIFIED WITH: Levada Schilling [id number] @ [place]       Radiology Studies: No results found.      Scheduled Meds: . acidophilus  1 capsule Oral q1800  . calcium-vitamin D  2 tablet Oral Daily  . digoxin  0.0625 mg Oral Daily  . diltiazem  60 mg Oral Q6H  . gabapentin  300 mg Oral BID  . insulin aspart  0-5 Units Subcutaneous QHS  . insulin aspart  0-9 Units Subcutaneous TID WC  . insulin aspart  3 Units Subcutaneous TID WC  . mouth rinse  15 mL Mouth Rinse BID  . pantoprazole  40 mg Oral Daily  . polyvinyl alcohol  2 drop Both Eyes BID  . potassium chloride SA  20 mEq Oral BID  . sodium chloride  1 spray Each Nare TID  . sodium chloride flush  3 mL Intravenous Q12H  . torsemide  20 mg Oral Daily  . valACYclovir  1,000 mg Oral Daily   Continuous Infusions: . dexamethasone (DECADRON) IVPB [facility] Stopped (07/01/17 1007)     LOS: 6 days    Time spent: 25 minutes   Greater than 50% of the time spent on counseling and coordinating the care.   Leisa Lenz, MD [facility] Pager [phone]  If 7PM-7AM, please contact night-coverage [email or web address] Password [email or web address] 07/02/2017, 8:52 AM

## 2017-07-03 DIAGNOSIS — R531 Weakness: Secondary | ICD-10-CM

## 2017-07-03 DIAGNOSIS — I509 Heart failure, unspecified: Secondary | ICD-10-CM

## 2017-07-03 LAB — BASIC METABOLIC PANEL
ANION GAP: 9 (ref 5–15)
BUN: 43 mg/dL — ABNORMAL HIGH (ref 6–20)
CALCIUM: 8.6 mg/dL — AB (ref 8.9–10.3)
CO2: 24 mmol/L (ref 22–32)
CREATININE: 1.11 mg/dL — AB (ref 0.44–1.00)
Chloride: 104 mmol/L (ref 101–111)
GFR, EST AFRICAN AMERICAN: 51 mL/min — AB (ref >60)
GFR, EST NON AFRICAN AMERICAN: 44 mL/min — AB (ref >60)
Glucose, Bld: 190 mg/dL — ABNORMAL HIGH (ref 65–99)
Potassium: 5.1 mmol/L (ref 3.5–5.1)
Sodium: 137 mmol/L (ref 135–145)

## 2017-07-03 LAB — GLUCOSE, CAPILLARY
GLUCOSE-CAPILLARY: 292 mg/dL — AB (ref 65–99)
GLUCOSE-CAPILLARY: 232 mg/dL — AB (ref 65–99)
Glucose-Capillary: 170 mg/dL — ABNORMAL HIGH (ref 65–99)
Glucose-Capillary: 262 mg/dL — ABNORMAL HIGH (ref 65–99)

## 2017-07-03 LAB — CBC
HCT: 41.3 % (ref 36.0–46.0)
Hemoglobin: 12.7 g/dL (ref 12.0–15.0)
MCH: 26 pg (ref 26.0–34.0)
MCHC: 30.8 g/dL (ref 30.0–36.0)
MCV: 84.5 fL (ref 78.0–100.0)
PLATELETS: 112 10*3/uL — AB (ref 150–400)
RBC: 4.89 MIL/uL (ref 3.87–5.11)
RDW: 16.5 % — AB (ref 11.5–15.5)
WBC: 9.8 10*3/uL (ref 4.0–10.5)

## 2017-07-03 NOTE — Progress Notes (Signed)
Mrs. Ruttan seems be doing better. She certainly looks better. Her platelet count has come up very nicely, as expected with steroids.  Her cultures have all been negative. However her urinalysis showed a bunch of white blood cells.  She is much more alert. She is still quite weak. I'm unsure how much physical therapy she is able to do.  She's had no bleeding. She's had no obvious fever. There's been no diarrhea.  Looks like from her recent CT angiogram that she had congestive heart failure.she's been diuresed.   Her blood pressure is quite high. It was 182/92 today. I'm sure this is a major contributor to the heart failure.  As she is an inpatient, it may not be about idea to try some IVIG on her. We cannot give her a large  Volume as this might exacerbate heart failure.  Steroids certainly are helpful. However, it definitely runs up her blood sugars.  I'm just glad to see that she is doing better.  Lattie Haw, MD  [place] 14:14

## 2017-07-03 NOTE — Progress Notes (Addendum)
Patient ID: [name] [name], female   DOB: 1931-03-05, 83 y.o.   MRN: [id number]  PROGRESS NOTE    [name] [name]  PNT:[id number] DOB: July 21, 1931 DOA: 06/26/2017  PCP: [name] Manes, MD   Brief Narrative:  83 year old female with paroxysmal a fib not on AC, CAD with stents, chronic diastolic CHF, hypertension, chronic ITP who presented to ED with altered mental status and hypoxia. Even with supplemental oxygen she remained confused. She was found to have non -occlusive DVT in RUE but was not started on [facility] due to severe thrombocytopenia. Due to low platelet count, Dr. Marin [name] started IV decadron which has improved her platelets and now he is thinking pt may also benefit from IVIG.    Assessment & Plan:   Principal Problem: Acute metabolic encephalopathy - Secondary to UTI and /or ITP - CT head showed chronic microvascular ischemia but no acute intracanal findings - CT angio chest negative for PE - Mental status has improved over past few days with improving platelets  Active Problems: UTI - Urinalysis showed too numerous to count white blood cells - Urine cx with multiple species none predominant - Cefepime stopped 10/16, has completed 7 days of cefepime     Moisture associated skin damage at the left lower buttock - Due to urinary incontinence - Appreciate WOC assessment  Acute on chronic diastolic CHF (congestive heart failure) (HCC) / Acute respiratory failure with hypoxia - Pt noted to have pulmonary edema on imagining - CTA showed extensive ground glass attenuation worrisome for CHF - Her resp status is table - We will continue torsemide, Cardizem and Digoxin    Right upper extremity nonocclusive DVT - Seen on Doppler 06/30/2017 - Pt not started on AC due to thrombocytopenia - Now that platelet count is better will defer to heme / onc if pt needs to be on [facility]  Paroxysmal Afib - CHA2DS2-VASc Score is 4 - Continue Cardizem and Digoxin for rate control -  As noted above, AC not initiated due to thrombocytopenia   Hypokalemia - Secondary to torsemide  - Supplemented   Chronic ITP (idiopathic thrombocytopenia) (HCC) - Completed decadron for 3 days, (10/15 - 10/17) - Dr. Marin [name] may start IVIG  CKD (chronic kidney disease), stage III (HCC) - Cr is within baseline range    DVT prophylaxis: SCD's Code Status: DNR/DNI Family Communication: no family at the bedside  Disposition Plan: Dr. Marin [name] would like to try IVIG while pt in hospital    Consultants:   Oncology/ hematology. Dr. Marin [name]  WOC  Procedures:   Right upper extremity Doppler 06/30/2017 - nonocclusive DVT in right upper extremity  Antimicrobials:   Cefepime 10/10 --> 10/16    Subjective: More alert, no overnight events.  Objective: Vitals:   07/02/17 0944 07/02/17 1535 07/02/17 2120 07/03/17 0630  BP: (!) 156/78 (!) 154/72 (!) 162/80 (!) 182/92  Pulse: 88 90 95 88  Resp: _0 Temp: 97.8 F (36.6 C) 98 F (36.7 C) 97.8 F (36.6 C) 97.6 F (36.4 C)  TempSrc: Oral Oral Oral Oral  SpO2: 99% 98% 98% 98%  Weight:   95.1 kg (209 lb 10.5 oz) 95 kg (209 lb 7 oz)  Height:        Intake/Output Summary (Last 24 hours) at 07/03/17 0856 Last data filed at 07/03/17 0639  Gross per 24 hour  Intake             1620 ml  Output  2800 ml  Net            -1180 ml   Filed Weights   07/02/17 0500 07/02/17 2120 07/03/17 0630  Weight: 95.2 kg (209 lb 14.1 oz) 95.1 kg (209 lb 10.5 oz) 95 kg (209 lb 7 oz)    Examination:  General exam: Appears calm and comfortable  Respiratory system: Clear to auscultation. Respiratory effort normal. Cardiovascular system: S1 & S2 heard, Rate controlled  Gastrointestinal system: Abdomen is nondistended, soft and nontender. No organomegaly or masses felt. Normal bowel sounds heard. Central nervous system: Alert, no focal deficits Extremities: has LE edema Skin: warm, dry Psychiatry: Normal mood and  behavior   Data Reviewed: I have personally reviewed following labs and imaging studies  CBC:  Recent Labs Lab 06/26/17 1005  06/29/17 0324 06/30/17 0315 07/01/17 0511 07/02/17 0529 07/03/17 0552  WBC 9.4  < > 7.4 2.9* 10.2 8.8 9.8  NEUTROABS 7.0*  --   --   --   --   --   --   HGB 12.2  < > 11.0* 11.9* 11.2* 11.6* 12.7  HCT 40.2  < > 36.5 38.4 36.9 38.7 41.3  MCV 87  < > 85.5 84.6 84.4 85.6 84.5  PLT 21 Platelet count consistent in citrate*  < > 15* 13* 44* 82* 112*  < > = values in this interval not displayed. Basic Metabolic Panel:  Recent Labs Lab 06/29/17 0324 06/30/17 0315 07/01/17 0511 07/02/17 0529 07/03/17 0552  NA 142 140 137 139 137  K 3.3* 4.1 4.7 4.6 5.1  CL 105 105 104 106 104  CO2 _0 GLUCOSE 88 253* 193* 222* 190*  BUN 19 20 28* 38* 43*  CREATININE 1.00 1.07* 1.46* 1.21* 1.11*  CALCIUM 8.5* 8.6* 8.5* 8.7* 8.6*   GFR: Estimated Creatinine Clearance: 42.2 mL/min (A) (by C-G formula based on SCr of 1.11 mg/dL (H)). Liver Function Tests:  Recent Labs Lab 06/26/17 1005  AST 14  ALT 13  ALKPHOS 99*  BILITOT 0.70  PROT 6.1*  ALBUMIN 2.4*   No results for input(s): LIPASE, AMYLASE in the last 168 hours. No results for input(s): AMMONIA in the last 168 hours. Coagulation Profile: No results for input(s): INR, PROTIME in the last 168 hours. Cardiac Enzymes:  Recent Labs Lab 06/26/17 1308  TROPONINI <0.03   BNP (last 3 results) No results for input(s): PROBNP in the last 8760 hours. HbA1C: No results for input(s): HGBA1C in the last 72 hours. CBG:  Recent Labs Lab 07/02/17 0825 07/02/17 1202 07/02/17 1633 07/02/17 2204 07/03/17 0757  GLUCAP 212* 254* 283* 220* 170*   Lipid Profile: No results for input(s): CHOL, HDL, LDLCALC, TRIG, CHOLHDL, LDLDIRECT in the last 72 hours. Thyroid Function Tests: No results for input(s): TSH, T4TOTAL, FREET4, T3FREE, THYROIDAB in the last 72 hours. Anemia Panel: No results for  input(s): VITAMINB12, FOLATE, FERRITIN, TIBC, IRON, RETICCTPCT in the last 72 hours. Urine analysis:    Component Value Date/Time   COLORURINE YELLOW 06/26/2017 1400   APPEARANCEUR CLOUDY (A) 06/26/2017 1400   LABSPEC 1.010 06/26/2017 1400   PHURINE 6.0 06/26/2017 1400   GLUCOSEU NEGATIVE 06/26/2017 1400   HGBUR LARGE (A) 06/26/2017 1400   BILIRUBINUR NEGATIVE 06/26/2017 1400   KETONESUR NEGATIVE 06/26/2017 1400   PROTEINUR NEGATIVE 06/26/2017 1400   UROBILINOGEN 0.2 08/10/2013 0037   NITRITE POSITIVE (A) 06/26/2017 1400   LEUKOCYTESUR LARGE (A) 06/26/2017 1400   Sepsis Labs: _1 (procalcitonin:4,lacticidven:4)    Urine  culture     Status: Abnormal   Collection Time: 06/26/17  2:00 PM  Result Value Ref Range Status   Specimen Description URINE, RANDOM  Final   Special Requests NONE  Final   Culture MULTIPLE SPECIES PRESENT, SUGGEST RECOLLECTION (A)  Final   Report Status 06/27/2017 FINAL  Final  MRSA PCR Screening     Status: Abnormal   Collection Time: 06/26/17  9:14 PM  Result Value Ref Range Status   MRSA by PCR POSITIVE (A) NEGATIVE Final      Radiology Studies: Ct Head Wo Contrast Result Date: 06/26/2017 Chronic microvascular ischemia without acute intracranial abnormality.   Ct Angio Chest Pe W And/or Wo Contrast Result Date: 06/26/2017 Negative for pulmonary embolus. Extensive ground-glass attenuation in the lungs is most worrisome for congestive heart failure given marked cardiomegaly.   Ir Fluoro Guide Cv Line Right Result Date: 06/25/2017 Successful ultrasound and fluoroscopic guided placement of a right basilic vein approach, 34 cm, 5 French, single lumen PICC with tip at the superior caval-atrial junction. The PICC line is ready for immediate use.   Dg Chest Port 1 View Result Date: 06/26/2017 Lower lung volumes. Increased pulmonary interstitial opacity similar to [place], suspect acute interstitial edema. Viral/atypical respiratory infection fell less  likely.   Scheduled Meds: . acidophilus  1 capsule Oral q1800  . calcium-vitamin D  2 tablet Oral Daily  . digoxin  0.0625 mg Oral Daily  . diltiazem  60 mg Oral Q6H  . gabapentin  300 mg Oral BID  . insulin aspart  0-5 Units Subcutaneous QHS  . insulin aspart  0-9 Units Subcutaneous TID WC  . insulin aspart  3 Units Subcutaneous TID WC  . pantoprazole  40 mg Oral Daily  . potassium chloride SA  20 mEq Oral BID  . torsemide  20 mg Oral Daily  . valACYclovir  1,000 mg Oral Daily   Continuous Infusions: . dexamethasone (DECADRON) IVPB [facility] Stopped (07/02/17 1200)     LOS: 7 days    Time spent: 25 minutes  Greater than 50% of the time spent on counseling and coordinating the care.   Leisa Lenz, MD [facility] Pager [phone]  If 7PM-7AM, please contact night-coverage [email or web address] Password [email or web address] 07/03/2017, 8:56 AM

## 2017-07-04 DIAGNOSIS — I25810 Atherosclerosis of coronary artery bypass graft(s) without angina pectoris: Secondary | ICD-10-CM

## 2017-07-04 DIAGNOSIS — F1120 Opioid dependence, uncomplicated: Secondary | ICD-10-CM

## 2017-07-04 DIAGNOSIS — G9340 Encephalopathy, unspecified: Secondary | ICD-10-CM

## 2017-07-04 DIAGNOSIS — T380X5A Adverse effect of glucocorticoids and synthetic analogues, initial encounter: Secondary | ICD-10-CM

## 2017-07-04 DIAGNOSIS — N183 Chronic kidney disease, stage 3 (moderate): Secondary | ICD-10-CM

## 2017-07-04 DIAGNOSIS — D696 Thrombocytopenia, unspecified: Secondary | ICD-10-CM

## 2017-07-04 DIAGNOSIS — T40601A Poisoning by unspecified narcotics, accidental (unintentional), initial encounter: Secondary | ICD-10-CM

## 2017-07-04 DIAGNOSIS — J9611 Chronic respiratory failure with hypoxia: Secondary | ICD-10-CM

## 2017-07-04 DIAGNOSIS — N390 Urinary tract infection, site not specified: Secondary | ICD-10-CM

## 2017-07-04 DIAGNOSIS — R739 Hyperglycemia, unspecified: Secondary | ICD-10-CM

## 2017-07-04 DIAGNOSIS — I5033 Acute on chronic diastolic (congestive) heart failure: Secondary | ICD-10-CM

## 2017-07-04 LAB — CBC
HEMATOCRIT: 41.9 % (ref 36.0–46.0)
HEMOGLOBIN: 12.8 g/dL (ref 12.0–15.0)
MCH: 25.8 pg — AB (ref 26.0–34.0)
MCHC: 30.5 g/dL (ref 30.0–36.0)
MCV: 84.3 fL (ref 78.0–100.0)
Platelets: 204 10*3/uL (ref 150–400)
RBC: 4.97 MIL/uL (ref 3.87–5.11)
RDW: 16.7 % — ABNORMAL HIGH (ref 11.5–15.5)
WBC: 8.8 10*3/uL (ref 4.0–10.5)

## 2017-07-04 LAB — BASIC METABOLIC PANEL
Anion gap: 11 (ref 5–15)
BUN: 53 mg/dL — AB (ref 6–20)
CHLORIDE: 104 mmol/L (ref 101–111)
CO2: 24 mmol/L (ref 22–32)
Calcium: 8.5 mg/dL — ABNORMAL LOW (ref 8.9–10.3)
Creatinine, Ser: 1.15 mg/dL — ABNORMAL HIGH (ref 0.44–1.00)
GFR calc Af Amer: 49 mL/min — ABNORMAL LOW (ref >60)
GFR calc non Af Amer: 42 mL/min — ABNORMAL LOW (ref >60)
Glucose, Bld: 202 mg/dL — ABNORMAL HIGH (ref 65–99)
POTASSIUM: 4.1 mmol/L (ref 3.5–5.1)
Sodium: 139 mmol/L (ref 135–145)

## 2017-07-04 LAB — GLUCOSE, CAPILLARY
GLUCOSE-CAPILLARY: 198 mg/dL — AB (ref 65–99)
Glucose-Capillary: 266 mg/dL — ABNORMAL HIGH (ref 65–99)

## 2017-07-04 MED ORDER — ENOXAPARIN SODIUM 100 MG/ML ~~LOC~~ SOLN: 90.0000 mg | Freq: Two times a day (BID) | SUBCUTANEOUS | Status: DC

## 2017-07-04 MED ORDER — TRAMADOL HCL 50 MG PO TABS: 50.0000 mg | ORAL_TABLET | Freq: Three times a day (TID) | ORAL | 0 refills | Status: DC

## 2017-07-04 MED ORDER — OXYCODONE HCL 10 MG PO TABS: 10.0000 mg | ORAL_TABLET | Freq: Four times a day (QID) | ORAL | 0 refills | Status: DC | PRN

## 2017-07-04 MED ORDER — ENOXAPARIN SODIUM 100 MG/ML ~~LOC~~ SOLN
90.0000 mg | Freq: Two times a day (BID) | SUBCUTANEOUS | Status: DC
  Administered 2017-07-04: 90 mg via SUBCUTANEOUS
  Filled 2017-07-04: qty 1

## 2017-07-04 NOTE — Progress Notes (Signed)
Inpatient Diabetes Program Recommendations  AACE/ADA: New Consensus Statement on Inpatient Glycemic Control (2015)  Target Ranges:  Prepandial:   less than 140 mg/dL      Peak postprandial:   less than 180 mg/dL (1-2 hours)      Critically ill patients:  140 - 180 mg/dL   Lab Results  Component Value Date   GLUCAP 198 (H) 07/04/2017   HGBA1C 5.8 (H) 12/21/2012    Review of Glycemic Control  Post-prandial blood sugars still elevated, likely d/t steroids. May benefit from increasing meal coverage insulin.  Inpatient Diabetes Program Recommendations:    Increase Novolog to 5 units tidwc for meal coverage insulin. If FBS continues > 180 mg/dL, add small amount (Lantus 10 units QHS) basal insulin.  Continue to follow.   Thank you. Lorenda Peck, RD, LDN, CDE Inpatient Diabetes Coordinator [phone]

## 2017-07-04 NOTE — Discharge Summary (Signed)
Physician Discharge Summary  [name]:[id number] DOB: Jun 11, 1931 DOA: 06/26/2017  PCP: Lajean Manes, MD  Admit date: 06/26/2017 Discharge date: 07/04/2017  Admitted From: [name] Disposition: [facility]  Recommendations for Outpatient Follow-up:  1. Follow up with heme/onc, Dr. Marin Olp in 1-2 weeks, considering rituximab infusion.  2. Continue lovenox 78m BID for RUE VTE treatment.  3. Monitor BMP and CBC in 1 week.   Home Health: N/A Equipment/Devices: None new Discharge Condition: Stable CODE STATUS: DNR Diet recommendation: Heart healthy, carb-modified  Brief/Interim Summary: [name] H RBartleyis a 83y.o. female with a history of PAF, CAD s/p stents, chronic HFpEF, HTN, chronic ITP who presented with AMS and hypoxia found to be thrombocytopenic. Also had nonocclusive DVT in RUE, significant pyuria and volume overload and hypoxia. She was diuresed and treated with 7 days of antibiotics, though cultures have remained negative. Dr. EMarin Olpwas consulted, recommending steroid therapy which has caused hyperglycemia but also a robust response with normalization of platelets to 204. With this improvement, it is recommended that she start lovenox for the DVT. Her mental status has improved with these treatments and she is stable for discharge.   Discharge Diagnoses:  Principal Problem:   Acute encephalopathy Active Problems:   HTN (hypertension)   Thrombocytopenia (HCC)   CAD (coronary artery disease) of artery bypass graft   Chronic narcotic dependence (HCC)   Chronic ITP (idiopathic thrombocytopenia) (HCC)   Acute on chronic diastolic CHF (congestive heart failure) (HCC)   CKD (chronic kidney disease), stage III (HCC)   Chronic respiratory failure with hypoxia ([facility]   Acute lower UTI   Adult failure to thrive   DNR (do not resuscitate)  ITP: Platelets responded robustly to decadron 10/15-10/17. 204 on day of discharge. No clinical bleeding. No anemia. Renal  function at baseline.  - Follow up with Dr. EMarin Olpin ~10 days to consider rituximab therapy.   Steroid-induced hyperglycemia: And possibly hypertension.  - Treated with sliding scale insulin while inpatient. Last HbA1c not diagnostic of diabetes and CBGs prior to steroids were at inpatient goal. - Continue home januvia, levemir and SSI, so will discontinue glipizide to avoid therapeutic duplication.   Acute metabolic encephalopathy: Attributed to ITP and/or UTI both treated and AMS resolved, may have some residual sundowning possibly due to delirium.   UTI: Nonclonal urine culture, s/p 7 days of cefepime.   Moisture associated skin damage at the left lower buttock - Keep area dry as able.   Acute on chronic diastolic CHF / Acute respiratory failure with hypoxia - Continue torsemide, diltiazem, digoxin. Appears euvolemic at discharge.   Right upper extremity nonocclusive DVT: Negative CTA chest for PE.  - Start on lovenox per hematology, Dr. EMarin Olp   Paroxysmal Afib: CHA2DS2-VASc Score is 4 - Continue metoprolol, cardizem and digoxin for rate control - On lovenox for VTE Tx as above. Defer ongoing anticoagulation to outpatient providers.   Hypokalemia: Resolved. Continue supplement with torsemide  CKD stage III: Cr is within baseline range  - Renally adjust lovenox as indicated.   Chronic back pain:  - Continue home regimen. Monitor for constipation.   Discharge Instructions Discharge Instructions    Diet - low sodium heart healthy    Complete by:  As directed    Diet Carb Modified    Complete by:  As directed    Increase activity slowly    Complete by:  As directed      Allergies as of 07/04/2017      Reactions  Keflex [cephalexin] Other (See Comments)   Reaction:  Unknown       Medication List    STOP taking these medications   glipiZIDE 2.5 MG 24 hr tablet Commonly known as:  GLUCOTROL XL     TAKE these medications   albuterol (2.5 MG/3ML) 0.083%  nebulizer solution Commonly known as:  PROVENTIL Take 2.5 mg by nebulization every 6 (six) hours as needed for wheezing or shortness of breath.   CALCIUM 600+D 600-200 MG-UNIT Tabs Generic drug:  Calcium Carbonate-Vitamin D Take 2 tablets by mouth daily.   digoxin 0.125 MG tablet Commonly known as:  LANOXIN Take 0.0625 mg by mouth daily.   diltiazem 240 MG 24 hr capsule Commonly known as:  CARDIZEM CD Take 1 capsule (240 mg total) by mouth daily.   enoxaparin 100 MG/ML injection Commonly known as:  LOVENOX Inject 0.9 mLs (90 mg total) into the skin 2 (two) times daily.   famciclovir 250 MG tablet Commonly known as:  FAMVIR Take 1 tablet (250 mg total) by mouth daily.   famotidine 20 MG tablet Commonly known as:  PEPCID Take 20 mg by mouth 2 (two) times daily.   gabapentin 300 MG capsule Commonly known as:  NEURONTIN Take 300 mg by mouth 2 (two) times daily.   insulin lispro 100 UNIT/ML injection Commonly known as:  HUMALOG Inject 0-15 Units into the skin 3 (three) times daily as needed for high blood sugar. Pt uses as needed per sliding scale:  70-120:  0 units, 121-150:  2 units, 151-200:  3 units, 201-250:  5 units, 251-300:  8 units, 301-350:  11 units, 351-400:  15 units, Greater than 400:  Call MD   LEVEMIR 100 UNIT/ML injection Generic drug:  insulin detemir Inject 6 Units into the skin at bedtime.   metoprolol tartrate 100 MG tablet Commonly known as:  LOPRESSOR Take 1 tablet (100 mg total) by mouth 2 (two) times daily.   Oxycodone HCl 10 MG Tabs Take 1 tablet (10 mg total) by mouth every 6 (six) hours as needed (pain).   pantoprazole 40 MG tablet Commonly known as:  PROTONIX Take 1 tablet (40 mg total) by mouth daily.   potassium chloride SA 20 MEQ tablet Commonly known as:  K-DUR,KLOR-CON Take 1 tablet (20 mEq total) by mouth 2 (two) times daily.   RISA-BID PROBIOTIC PO Take 1 tablet by mouth daily.   sitaGLIPtin 50 MG tablet Commonly known as:   JANUVIA Take 50 mg by mouth daily.   sodium chloride 0.65 % Soln nasal spray Commonly known as:  OCEAN Place 1 spray into both nostrils 3 (three) times daily.   SYSTANE 0.4-0.3 % Soln Generic drug:  Polyethyl Glycol-Propyl Glycol Place 2 drops into both eyes 2 (two) times daily.   torsemide 20 MG tablet Commonly known as:  DEMADEX Take 40 mg by mouth daily.   traMADol 50 MG tablet Commonly known as:  ULTRAM Take 1 tablet (50 mg total) by mouth 3 (three) times daily.      Follow-up Information    Stoneking, Hal, MD. Schedule an appointment as soon as possible for a visit in 2 week(s).   Specialty:  Internal Medicine Contact information: [address] [place] [address] [phone]        Volanda Napoleon, MD. Schedule an appointment as soon as possible for a visit in 10 day(s).   Specialty:  Oncology Contact information: [address] [place] [address]  [phone]          Allergies  Allergen Reactions  . Keflex [Cephalexin] Other (See Comments)    Reaction:  Unknown     Consultations:  Heme/onc, Dr. Marin Olp  Procedures/Studies: Ct Head Wo Contrast  Result Date: 06/26/2017 CLINICAL DATA:  Altered mental status EXAM: CT HEAD WITHOUT CONTRAST TECHNIQUE: Contiguous axial images were obtained from the base of the skull through the vertex without intravenous contrast. COMPARISON:  Head CT 01/18/2017 FINDINGS: Brain: No mass lesion, intraparenchymal hemorrhage or extra-axial collection. No evidence of acute cortical infarct. There is periventricular hypoattenuation compatible with chronic microvascular disease. Vascular: No hyperdense vessel or unexpected calcification. Skull: Normal visualized skull base, calvarium and extracranial soft tissues. Sinuses/Orbits: No sinus fluid levels or advanced mucosal thickening. No mastoid effusion. Normal orbits. IMPRESSION: Chronic microvascular ischemia without acute intracranial  abnormality. Electronically Signed   By: Ulyses Jarred M.D.   On: 06/26/2017 15:06   Ct Angio Chest Pe W And/or Wo Contrast  Result Date: 06/26/2017 CLINICAL DATA:  Altered level of consciousness and decreased oxygen saturation today. EXAM: CT ANGIOGRAPHY CHEST WITH CONTRAST TECHNIQUE: Multidetector CT imaging of the chest was performed using the standard protocol during bolus administration of intravenous contrast. Multiplanar CT image reconstructions and MIPs were obtained to evaluate the vascular anatomy. CONTRAST:  100 cc Isovue 370. COMPARISON:  CT chest 01/19/2017. Single-view of the chest earlier today. FINDINGS: Cardiovascular: No pulmonary embolus is identified. There is calcific aortic and coronary atherosclerosis. Cardiomegaly is seen. No pericardial effusion. Contrast refluxes into the inferior vena cava and hepatic veins compatible with right heart insufficiency. Mediastinum/Nodes: No enlarged mediastinal, hilar, or axillary lymph nodes. Thyroid gland, trachea, and esophagus demonstrate no significant findings. Lungs/Pleura: No pleural effusion. As on the prior CT, the lungs demonstrate extensive ground-glass attenuation with an upper lobe predominance. No nodule or mass is identified. Upper Abdomen: No acute abnormality. Musculoskeletal: Spinal stimulator is in place. There is exaggeration of the normal thoracic kyphosis. Multilevel degenerative disc disease is identified. No fracture. No lytic or sclerotic lesion. Review of the MIP images confirms the above findings. IMPRESSION: Negative for pulmonary embolus. Extensive ground-glass attenuation in the lungs is most worrisome for congestive heart failure given marked cardiomegaly. Aortic Atherosclerosis (ICD10-I70.0). Electronically Signed   By: Inge Rise M.D.   On: 06/26/2017 15:13   Ir Fluoro Guide Cv Line Right  Result Date: 06/25/2017 INDICATION: Poor venous access. In need of intravenous access for short-term chemotherapy  administration. EXAM: ULTRASOUND AND FLUOROSCOPIC GUIDED PICC LINE INSERTION MEDICATIONS: None. CONTRAST:  None FLUOROSCOPY TIME:  12 seconds (8 mGy) COMPLICATIONS: None immediate. TECHNIQUE: The procedure, risks, benefits, and alternatives were explained to the patient and informed written consent was obtained. A timeout was performed prior to the initiation of the procedure. The right upper extremity was prepped with chlorhexidine in a sterile fashion, and a sterile drape was applied covering the operative field. Maximum barrier sterile technique with sterile gowns and gloves were used for the procedure. A timeout was performed prior to the initiation of the procedure. Local anesthesia was provided with 1% lidocaine. Under direct ultrasound guidance, the basilic vein was accessed with a micropuncture kit after the overlying soft tissues were anesthetized with 1% lidocaine. After the overlying soft tissues were anesthetized, a small venotomy incision was created and a micropuncture kit was utilized to access the right basilic vein. Real-time ultrasound guidance was utilized for vascular access including the acquisition of a permanent ultrasound image documenting patency of the accessed vessel.  A guidewire was advanced to the level of the superior caval-atrial junction for measurement purposes and the PICC line was cut to length. A peel-away sheath was placed and a 34 cm, 5 [place], single lumen was inserted to level of the superior caval-atrial junction. A post procedure spot fluoroscopic was obtained. The catheter easily aspirated and flushed and was sutured in place. A dressing was placed. The patient tolerated the procedure well without immediate post procedural complication. FINDINGS: After catheter placement, the tip lies within the superior cavoatrial junction. The catheter aspirates and flushes normally and is ready for immediate use. IMPRESSION: Successful ultrasound and fluoroscopic guided placement of a  right basilic vein approach, 34 cm, 5 French, single lumen PICC with tip at the superior caval-atrial junction. The PICC line is ready for immediate use. Electronically Signed   By: Sandi Mariscal M.D.   On: 06/25/2017 12:26   Ir US Guide Vasc Access Right  Result Date: 06/25/2017 INDICATION: Poor venous access. In need of intravenous access for short-term chemotherapy administration. EXAM: ULTRASOUND AND FLUOROSCOPIC GUIDED PICC LINE INSERTION MEDICATIONS: None. CONTRAST:  None FLUOROSCOPY TIME:  12 seconds (8 mGy) COMPLICATIONS: None immediate. TECHNIQUE: The procedure, risks, benefits, and alternatives were explained to the patient and informed written consent was obtained. A timeout was performed prior to the initiation of the procedure. The right upper extremity was prepped with chlorhexidine in a sterile fashion, and a sterile drape was applied covering the operative field. Maximum barrier sterile technique with sterile gowns and gloves were used for the procedure. A timeout was performed prior to the initiation of the procedure. Local anesthesia was provided with 1% lidocaine. Under direct ultrasound guidance, the basilic vein was accessed with a micropuncture kit after the overlying soft tissues were anesthetized with 1% lidocaine. After the overlying soft tissues were anesthetized, a small venotomy incision was created and a micropuncture kit was utilized to access the right basilic vein. Real-time ultrasound guidance was utilized for vascular access including the acquisition of a permanent ultrasound image documenting patency of the accessed vessel. A guidewire was advanced to the level of the superior caval-atrial junction for measurement purposes and the PICC line was cut to length. A peel-away sheath was placed and a 34 cm, 5 [place], single lumen was inserted to level of the superior caval-atrial junction. A post procedure spot fluoroscopic was obtained. The catheter easily aspirated and flushed and  was sutured in place. A dressing was placed. The patient tolerated the procedure well without immediate post procedural complication. FINDINGS: After catheter placement, the tip lies within the superior cavoatrial junction. The catheter aspirates and flushes normally and is ready for immediate use. IMPRESSION: Successful ultrasound and fluoroscopic guided placement of a right basilic vein approach, 34 cm, 5 French, single lumen PICC with tip at the superior caval-atrial junction. The PICC line is ready for immediate use. Electronically Signed   By: Sandi Mariscal M.D.   On: 06/25/2017 12:26   Dg Chest Port 1 View  Result Date: 06/26/2017 CLINICAL DATA:  83 year old female with hypoxia. EXAM: PORTABLE CHEST 1 VIEW COMPARISON:  01/21/2017 and earlier. FINDINGS: Portable AP semi upright view at 1143 hours. Lower lung volumes. Stable cardiac size and mediastinal contours. Right side PICC line in place today. Chronic thoracic spinal stimulator device and lumbar fusion. Increased pulmonary vascularity and/or interstitial markings are similar to May, when chest CT demonstrated abnormal ground-glass opacity and septal thickening. No pneumothorax, pleural effusion, or consolidation. Advanced degenerative changes at both shoulders. Negative visible  bowel gas pattern. IMPRESSION: Lower lung volumes. Increased pulmonary interstitial opacity similar to May, suspect acute interstitial edema. Viral/atypical respiratory infection fell less likely. Electronically Signed   By: Genevie Ann M.D.   On: 06/26/2017 12:27    Right upper extremity Doppler 06/30/2017 - nonocclusive DVT in right upper extremity  Subjective: Feels better. Denies confusion. No bleeding. Bruising is stable, none new. Breathing at baseline, doesn't think she needs oxygen.   Discharge Exam: Vitals:   07/04/17 0626 07/04/17 0627  BP: (!) 159/87   Pulse:  (!) 108  Resp:  18  Temp:  98.3 F (36.8 C)  SpO2:  100%   General: Pt is alert, awake, not in  acute distress Cardiovascular: Rate controlled, no murmur or JVD.  Respiratory: Clear and nonlabored Abdominal: Soft, NT, ND, bowel sounds + Extremities: RUE edematous diffusely. LE's with trace edema.  Neuro/psych: Alert, oriented, no focal deficits.   Labs: BNP (last 3 results)  Recent Labs  11/17/16 1500 01/18/17 1524 06/26/17 1308  BNP 1,492.9* 428.7* 102.7*   Basic Metabolic Panel:  Recent Labs Lab 06/30/17 0315 07/01/17 0511 07/02/17 0529 07/03/17 0552 07/04/17 0630  NA 140 137 139 137 139  K 4.1 4.7 4.6 5.1 4.1  CL 105 104 106 104 104  CO2 _0 GLUCOSE 253* 193* 222* 190* 202*  BUN 20 28* 38* 43* 53*  CREATININE 1.07* 1.46* 1.21* 1.11* 1.15*  CALCIUM 8.6* 8.5* 8.7* 8.6* 8.5*   Liver Function Tests: No results for input(s): AST, ALT, ALKPHOS, BILITOT, PROT, ALBUMIN in the last 168 hours. No results for input(s): LIPASE, AMYLASE in the last 168 hours. No results for input(s): AMMONIA in the last 168 hours. CBC:  Recent Labs Lab 06/30/17 0315 07/01/17 0511 07/02/17 0529 07/03/17 0552 07/04/17 0630  WBC 2.9* 10.2 8.8 9.8 8.8  HGB 11.9* 11.2* 11.6* 12.7 12.8  HCT 38.4 36.9 38.7 41.3 41.9  MCV 84.6 84.4 85.6 84.5 84.3  PLT 13* 44* 82* 112* 204   Cardiac Enzymes: No results for input(s): CKTOTAL, CKMB, CKMBINDEX, TROPONINI in the last 168 hours. BNP: Invalid input(s): POCBNP CBG:  Recent Labs Lab 07/03/17 1159 07/03/17 1654 07/03/17 2243 07/04/17 0737 07/04/17 1147  GLUCAP 292* 232* 262* 198* 266*   D-Dimer No results for input(s): DDIMER in the last 72 hours. Hgb A1c No results for input(s): HGBA1C in the last 72 hours. Lipid Profile No results for input(s): CHOL, HDL, LDLCALC, TRIG, CHOLHDL, LDLDIRECT in the last 72 hours. Thyroid function studies No results for input(s): TSH, T4TOTAL, T3FREE, THYROIDAB in the last 72 hours.  Invalid input(s): FREET3 Anemia work up No results for input(s): VITAMINB12, FOLATE, FERRITIN, TIBC,  IRON, RETICCTPCT in the last 72 hours. Urinalysis    Component Value Date/Time   COLORURINE YELLOW 06/26/2017 1400   APPEARANCEUR CLOUDY (A) 06/26/2017 1400   LABSPEC 1.010 06/26/2017 1400   PHURINE 6.0 06/26/2017 1400   GLUCOSEU NEGATIVE 06/26/2017 1400   HGBUR LARGE (A) 06/26/2017 1400   BILIRUBINUR NEGATIVE 06/26/2017 1400   KETONESUR NEGATIVE 06/26/2017 1400   PROTEINUR NEGATIVE 06/26/2017 1400   UROBILINOGEN 0.2 08/10/2013 0037   NITRITE POSITIVE (A) 06/26/2017 1400   LEUKOCYTESUR LARGE (A) 06/26/2017 1400    Microbiology Recent Results (from the past 240 hour(s))  Urine culture     Status: Abnormal   Collection Time: 06/26/17  2:00 PM  Result Value Ref Range Status   Specimen Description URINE, RANDOM  Final   Special Requests NONE  Final  Culture MULTIPLE SPECIES PRESENT, SUGGEST RECOLLECTION (A)  Final   Report Status 06/27/2017 FINAL  Final  MRSA PCR Screening     Status: Abnormal   Collection Time: 06/26/17  9:14 PM  Result Value Ref Range Status   MRSA by PCR POSITIVE (A) NEGATIVE Final    Comment:        The GeneXpert MRSA Assay (FDA approved for NASAL specimens only), is one component of a comprehensive MRSA colonization surveillance program. It is not intended to diagnose MRSA infection nor to guide or monitor treatment for MRSA infections. RESULT CALLED TO, READ BACK BY AND VERIFIED WITH: Levada Schilling [id number] @ [place]     Time coordinating discharge: Approximately 40 minutes  Vance Gather, MD  [facility] 07/04/2017, 11:59 AM Pager [phone]

## 2017-07-04 NOTE — Progress Notes (Signed)
[facility] for Lovenox Indication: DVT of RUE, anti-coag held on admit for thrombocytopenia  Allergies  Allergen Reactions  . Keflex [Cephalexin] Other (See Comments)    Reaction:  Unknown    Patient Measurements: Height: _0  (162.6 cm) Weight: 205 lb (93 kg) IBW/kg (Calculated) : 54.7   Vital Signs: Temp: 98.3 F (36.8 C) (10/18 0627) Temp Source: Oral (10/18 0627) BP: 159/87 (10/18 0626) Pulse Rate: 108 (10/18 0627)  Labs:  Recent Labs  07/02/17 0529 07/03/17 0552 07/04/17 0630  HGB 11.6* 12.7 12.8  HCT 38.7 41.3 41.9  PLT 82* 112* 204  CREATININE 1.21* 1.11* 1.15*   Estimated Creatinine Clearance: 40.2 mL/min (A) (by C-G formula based on SCr of 1.15 mg/dL (H)).  Medications:  Scheduled:  . acidophilus  1 capsule Oral q1800  . calcium-vitamin D  2 tablet Oral Daily  . digoxin  0.0625 mg Oral Daily  . diltiazem  60 mg Oral Q6H  . gabapentin  300 mg Oral BID  . insulin aspart  0-5 Units Subcutaneous QHS  . insulin aspart  0-9 Units Subcutaneous TID WC  . insulin aspart  3 Units Subcutaneous TID WC  . mouth rinse  15 mL Mouth Rinse BID  . pantoprazole  40 mg Oral Daily  . polyvinyl alcohol  2 drop Both Eyes BID  . sodium chloride  1 spray Each Nare TID  . sodium chloride flush  3 mL Intravenous Q12H  . torsemide  20 mg Oral Daily  . valACYclovir  1,000 mg Oral Daily   Assessment:  83 y.o. female with chronic ITP to [facility] on 06/26/17 for her first treatment of rituxan, too lethargicto receive.  Patient was then sent to the ED for further workup.  To start cefepime for suspected UTI  Significant events: 10/14 Doppler: RUE DVT, held anti-coag for low Plt  Today, 07/04/2017  Platelets at 204K today, begin Lovenox for RUE DVT SCr elevated, Cl ~ 40 ml/min  Goal of Therapy:  Monitor platelets by anticoagulation protocol: Yes   Plan:   Lovenox 46m SQ q12 (165mkg q12)  Consider 13056maily (1.5mg57m q24) once tolerating  q12 dosing   Monitor CBC, s/s bleed  GreeMinda DittormD Pager [phone]/2018, 11:55 AM

## 2017-07-04 NOTE — Progress Notes (Addendum)
D/C Summary faxed to [facility]. Patient is returning. CSW left voicemail for patient son-Allen/ returned call. Wrong number listed for son.  CSW spoke with admission coordinator at facility, she reports son appointed SNF as the patient discharge planner when at the hospital.  Facility will accept patient after 1:30, [facility] scheduled for transport.  Nurse given number for report.   Kathrin Greathouse, Latanya Presser, MSW Clinical Social Worker  [phone] 07/04/2017  12:32 PM

## 2017-07-16 ENCOUNTER — Ambulatory Visit (HOSPITAL_BASED_OUTPATIENT_CLINIC_OR_DEPARTMENT_OTHER): Payer: Medicare Other

## 2017-07-16 ENCOUNTER — Ambulatory Visit (HOSPITAL_BASED_OUTPATIENT_CLINIC_OR_DEPARTMENT_OTHER): Payer: Medicare Other | Admitting: Family

## 2017-07-16 ENCOUNTER — Other Ambulatory Visit: Payer: Medicare Other

## 2017-07-16 ENCOUNTER — Other Ambulatory Visit (HOSPITAL_BASED_OUTPATIENT_CLINIC_OR_DEPARTMENT_OTHER): Payer: Medicare Other

## 2017-07-16 VITALS — BP 124/60 | HR 90 | Temp 97.5°F | Resp 18

## 2017-07-16 DIAGNOSIS — D696 Thrombocytopenia, unspecified: Secondary | ICD-10-CM

## 2017-07-16 DIAGNOSIS — D693 Immune thrombocytopenic purpura: Secondary | ICD-10-CM

## 2017-07-16 DIAGNOSIS — R5383 Other fatigue: Secondary | ICD-10-CM

## 2017-07-16 LAB — CBC WITH DIFFERENTIAL (CANCER CENTER ONLY)
BASO#: 0.1 10*3/uL (ref 0.0–0.2)
BASO%: 0.9 % (ref 0.0–2.0)
EOS ABS: 0.2 10*3/uL (ref 0.0–0.5)
EOS%: 1.8 % (ref 0.0–7.0)
HEMATOCRIT: 38.7 % (ref 34.8–46.6)
HEMOGLOBIN: 11.8 g/dL (ref 11.6–15.9)
LYMPH#: 1.2 10*3/uL (ref 0.9–3.3)
LYMPH%: 12.6 % — ABNORMAL LOW (ref 14.0–48.0)
MCH: 26.9 pg (ref 26.0–34.0)
MCHC: 30.5 g/dL — AB (ref 32.0–36.0)
MCV: 88 fL (ref 81–101)
MONO#: 0.5 10*3/uL (ref 0.1–0.9)
MONO%: 5.6 % (ref 0.0–13.0)
NEUT%: 79.1 % (ref 39.6–80.0)
NEUTROS ABS: 7.2 10*3/uL — AB (ref 1.5–6.5)
Platelets: 33 10*3/uL — ABNORMAL LOW (ref 145–400)
RBC: 4.39 10*6/uL (ref 3.70–5.32)
RDW: 20.4 % — ABNORMAL HIGH (ref 11.1–15.7)
WBC: 9.1 10*3/uL (ref 3.9–10.0)

## 2017-07-16 LAB — CMP (CANCER CENTER ONLY)
ALBUMIN: 2.8 g/dL — AB (ref 3.3–5.5)
ALK PHOS: 93 U/L — AB (ref 26–84)
ALT(SGPT): 17 U/L (ref 10–47)
AST: 13 U/L (ref 11–38)
BILIRUBIN TOTAL: 0.9 mg/dL (ref 0.20–1.60)
BUN, Bld: 19 mg/dL (ref 7–22)
CO2: 30 meq/L (ref 18–33)
Calcium: 9.1 mg/dL (ref 8.0–10.3)
Chloride: 104 mEq/L (ref 98–108)
Creat: 1.1 mg/dl (ref 0.6–1.2)
Glucose, Bld: 138 mg/dL — ABNORMAL HIGH (ref 73–118)
Potassium: 3.8 mEq/L (ref 3.3–4.7)
Sodium: 142 mEq/L (ref 128–145)
Total Protein: 6.5 g/dL (ref 6.4–8.1)

## 2017-07-16 LAB — TECHNOLOGIST REVIEW CHCC SATELLITE

## 2017-07-16 MED ORDER — SODIUM CHLORIDE 0.9 % IV SOLN
Freq: Once | INTRAVENOUS | Status: AC
  Administered 2017-07-16: 10:00:00 via INTRAVENOUS

## 2017-07-16 MED ORDER — FAMOTIDINE IN NACL 20-0.9 MG/50ML-% IV SOLN
20.0000 mg | Freq: Once | INTRAVENOUS | Status: AC
  Administered 2017-07-16: 20 mg via INTRAVENOUS

## 2017-07-16 MED ORDER — DEXAMETHASONE 2 MG PO TABS: 20.0000 mg | ORAL_TABLET | Freq: Every day | ORAL | 0 refills | Status: DC

## 2017-07-16 MED ORDER — FAMOTIDINE IN NACL 20-0.9 MG/50ML-% IV SOLN
INTRAVENOUS | Status: AC
  Filled 2017-07-16: qty 50

## 2017-07-16 MED ORDER — SODIUM CHLORIDE 0.9 % IV SOLN
20.0000 mg | Freq: Once | INTRAVENOUS | Status: AC
  Administered 2017-07-16: 20 mg via INTRAVENOUS
  Filled 2017-07-16: qty 2

## 2017-07-16 NOTE — Patient Instructions (Signed)
Dexamethasone injection What is this medicine? DEXAMETHASONE (dex a METH a sone) is a corticosteroid. It is used to treat inflammation of the skin, joints, lungs, and other organs. Common conditions treated include asthma, allergies, and arthritis. It is also used for other conditions, like blood disorders and diseases of the adrenal glands. This medicine may be used for other purposes; ask your health care provider or pharmacist if you have questions. COMMON BRAND NAME(S): Decadron, DoubleDex, Simplist Dexamethasone, Solurex What should I tell my health care provider before I take this medicine? They need to know if you have any of these conditions: -blood clotting problems -Cushing's syndrome -diabetes -glaucoma -heart problems or disease -high blood pressure -infection like herpes, measles, tuberculosis, or chickenpox -kidney disease -liver disease -mental problems -myasthenia gravis -osteoporosis -previous heart attack -seizures -stomach, ulcer or intestine disease including colitis and diverticulitis -thyroid problem -an unusual or allergic reaction to dexamethasone, corticosteroids, other medicines, lactose, foods, dyes, or preservatives -pregnant or trying to get pregnant -breast-feeding How should I use this medicine? This medicine is for injection into a muscle, joint, lesion, soft tissue, or vein. It is given by a health care professional in a hospital or clinic setting. Talk to your pediatrician regarding the use of this medicine in children. Special care may be needed. Overdosage: If you think you have taken too much of this medicine contact a poison control center or emergency room at once. NOTE: This medicine is only for you. Do not share this medicine with others. What if I miss a dose? This may not apply. If you are having a series of injections over a prolonged period, try not to miss an appointment. Call your doctor or health care professional to reschedule if you  are unable to keep an appointment. What may interact with this medicine? Do not take this medicine with any of the following medications: -mifepristone, RU-486 -vaccines This medicine may also interact with the following medications: -amphotericin B -antibiotics like clarithromycin, erythromycin, and troleandomycin -aspirin and aspirin-like drugs -barbiturates like phenobarbital -carbamazepine -cholestyramine -cholinesterase inhibitors like donepezil, galantamine, rivastigmine, and tacrine -cyclosporine -digoxin -diuretics -ephedrine -female hormones, like estrogens or progestins and birth control pills -indinavir -isoniazid -ketoconazole -medicines for diabetes -medicines that improve muscle tone or strength for conditions like myasthenia gravis -NSAIDs, medicines for pain and inflammation, like ibuprofen or naproxen -phenytoin -rifampin -thalidomide -warfarin This list may not describe all possible interactions. Give your health care provider a list of all the medicines, herbs, non-prescription drugs, or dietary supplements you use. Also tell them if you smoke, drink alcohol, or use illegal drugs. Some items may interact with your medicine. What should I watch for while using this medicine? Your condition will be monitored carefully while you are receiving this medicine. If you are taking this medicine for a long time, carry an identification card with your name and address, the type and dose of your medicine, and your doctor's name and address. This medicine may increase your risk of getting an infection. Stay away from people who are sick. Tell your doctor or health care professional if you are around anyone with measles or chickenpox. Talk to your health care provider before you get any vaccines that you take this medicine. If you are going to have surgery, tell your doctor or health care professional that you have taken this medicine within the last twelve months. Ask your  doctor or health care professional about your diet. You may need to lower the amount of salt you eat. The  medicine can increase your blood sugar. If you are a diabetic check with your doctor if you need help adjusting the dose of your diabetic medicine. What side effects may I notice from receiving this medicine? Side effects that you should report to your doctor or health care professional as soon as possible: -allergic reactions like skin rash, itching or hives, swelling of the face, lips, or tongue -black or tarry stools -change in the amount of urine -changes in vision -confusion, excitement, restlessness, a false sense of well-being -fever, sore throat, sneezing, cough, or other signs of infection, wounds that will not heal -hallucinations -increased thirst -mental depression, mood swings, mistaken feelings of self importance or of being mistreated -pain in hips, back, ribs, arms, shoulders, or legs -pain, redness, or irritation at the injection site -redness, blistering, peeling or loosening of the skin, including inside the mouth -rounding out of face -swelling of feet or lower legs -unusual bleeding or bruising -unusual tired or weak -wounds that do not heal Side effects that usually do not require medical attention (report to your doctor or health care professional if they continue or are bothersome): -diarrhea or constipation -change in taste -headache -nausea, vomiting -skin problems, acne, thin and shiny skin -touble sleeping -unusual growth of hair on the face or body -weight gain This list may not describe all possible side effects. Call your doctor for medical advice about side effects. You may report side effects to FDA at 1-800-FDA-1088. Where should I keep my medicine? This drug is given in a hospital or clinic and will not be stored at home. NOTE: This sheet is a summary. It may not cover all possible information. If you have questions about this medicine, talk to  your doctor, pharmacist, or health care provider.  2018 Elsevier/Gold Standard (2007-12-25 14:04:12)

## 2017-07-16 NOTE — Progress Notes (Signed)
Hematology and Oncology Follow Up Visit  [name] [name] [id number] 1931-06-12 83 y.o. 07/16/2017   Principle Diagnosis:  Chronic ITP  Current Therapy:   Decadron 40 mg PO daily for 4 days - started 07/16/17    Interim History:  [name] [name] is here today for follow-up and Rituxan. She is now home from the hospital and doing a bit better. She is still quite fatigued and admits that she is not eating or drinking well.  Platelet count while in the hospital was up in the 200's while on decadron. Count today has dropped back down to 33. Hgb is stable at 11.8.  She denies any episodes of bleeding. She has extensive bruising in various stages of healing all over her body. She also has noticeable petechiae on her arms.  She denies SOB on 2 L [place] 24 hours a day.  No fever, chills, n/v, cough, rash, dizziness, SOB, chest pain, palpitations, abdominal pain or changes in bowel or bladder habits.  Chronic swelling in her lower extremities unchanged. No numbness or tingling in her hands or feet.   ECOG Performance Status: 1 - Symptomatic but completely ambulatory  Medications:  Allergies as of 07/16/2017      Reactions   Keflex [cephalexin] Other (See Comments)   Reaction:  Unknown       Medication List       Accurate as of 07/16/17  8:49 AM. Always use your most recent med list.          albuterol (2.5 MG/3ML) 0.083% nebulizer solution Commonly known as:  PROVENTIL Take 2.5 mg by nebulization every 6 (six) hours as needed for wheezing or shortness of breath.   CALCIUM 600+D 600-200 MG-UNIT Tabs Generic drug:  Calcium Carbonate-Vitamin D Take 2 tablets by mouth daily.   digoxin 0.125 MG tablet Commonly known as:  LANOXIN Take 0.0625 mg by mouth daily.   diltiazem 240 MG 24 hr capsule Commonly known as:  CARDIZEM CD Take 1 capsule (240 mg total) by mouth daily.   enoxaparin 100 MG/ML injection Commonly known as:  LOVENOX Inject 0.9 mLs (90 mg total) into the skin 2 (two)  times daily.   famciclovir 250 MG tablet Commonly known as:  FAMVIR Take 1 tablet (250 mg total) by mouth daily.   famotidine 20 MG tablet Commonly known as:  PEPCID Take 20 mg by mouth 2 (two) times daily.   gabapentin 300 MG capsule Commonly known as:  NEURONTIN Take 300 mg by mouth 2 (two) times daily.   insulin lispro 100 UNIT/ML injection Commonly known as:  HUMALOG Inject 0-15 Units into the skin 3 (three) times daily as needed for high blood sugar. Pt uses as needed per sliding scale:  70-120:  0 units, 121-150:  2 units, 151-200:  3 units, 201-250:  5 units, 251-300:  8 units, 301-350:  11 units, 351-400:  15 units, Greater than 400:  Call MD   LEVEMIR 100 UNIT/ML injection Generic drug:  insulin detemir Inject 6 Units into the skin at bedtime.   metoprolol tartrate 100 MG tablet Commonly known as:  LOPRESSOR Take 1 tablet (100 mg total) by mouth 2 (two) times daily.   Oxycodone HCl 10 MG Tabs Take 1 tablet (10 mg total) by mouth every 6 (six) hours as needed (pain).   pantoprazole 40 MG tablet Commonly known as:  PROTONIX Take 1 tablet (40 mg total) by mouth daily.   potassium chloride SA 20 MEQ tablet Commonly known as:  K-DUR,KLOR-CON Take  1 tablet (20 mEq total) by mouth 2 (two) times daily.   RISA-BID PROBIOTIC PO Take 1 tablet by mouth daily.   sitaGLIPtin 50 MG tablet Commonly known as:  JANUVIA Take 50 mg by mouth daily.   sodium chloride 0.65 % Soln nasal spray Commonly known as:  OCEAN Place 1 spray into both nostrils 3 (three) times daily.   SYSTANE 0.4-0.3 % Soln Generic drug:  Polyethyl Glycol-Propyl Glycol Place 2 drops into both eyes 2 (two) times daily.   torsemide 20 MG tablet Commonly known as:  DEMADEX Take 40 mg by mouth daily.   traMADol 50 MG tablet Commonly known as:  ULTRAM Take 1 tablet (50 mg total) by mouth 3 (three) times daily.       Allergies:  Allergies  Allergen Reactions  . Keflex [Cephalexin] Other (See  Comments)    Reaction:  Unknown     Past Medical History, Surgical history, Social history, and Family History were reviewed and updated.  Review of Systems: All other 10 point review of systems is negative.   Physical Exam:  vitals were not taken for this visit.  Wt Readings from Last 3 Encounters:  07/04/17 205 lb (93 kg)  01/22/17 187 lb 2.7 oz (84.9 kg)  11/23/16 197 lb 3.2 oz (89.4 kg)    Ocular: Sclerae unicteric, pupils equal, round and reactive to light Ear-nose-throat: Oropharynx clear, dentition fair Lymphatic: No cervical, supraclavicular or axillary adenopathy Lungs no rales or rhonchi, good excursion bilaterally Heart regular rate and rhythm, no murmur appreciated Abd soft, nontender, positive bowel sounds, no liver or spleen tip palpated on exam, no fluid wave  MSK age related kyphosis, no focal spinal tenderness, arthritic swelling in joints of hands   Neuro: non-focal, oriented to person, appropriate affect Breasts: Deferred   Lab Results  Component Value Date   WBC 8.8 07/04/2017   HGB 12.8 07/04/2017   HCT 41.9 07/04/2017   MCV 84.3 07/04/2017   PLT 204 07/04/2017   No results found for: FERRITIN, IRON, TIBC, UIBC, IRONPCTSAT Lab Results  Component Value Date   RBC 4.97 07/04/2017   No results found for: KPAFRELGTCHN, LAMBDASER, KAPLAMBRATIO No results found for: IGGSERUM, IGA, IGMSERUM No results found for: Odetta Pink, SPEI   Chemistry      Component Value Date/Time   NA 139 07/04/2017 0630   NA 143 06/26/2017 1005   NA 136 02/25/2017 1056   K 4.1 07/04/2017 0630   K 3.7 06/26/2017 1005   K 4.4 02/25/2017 1056   CL 104 07/04/2017 0630   CL 104 06/26/2017 1005   CO2 24 07/04/2017 0630   CO2 30 06/26/2017 1005   CO2 25 02/25/2017 1056   BUN 53 (H) 07/04/2017 0630   BUN 29 (H) 06/26/2017 1005   BUN 49.1 (H) 02/25/2017 1056   CREATININE 1.15 (H) 07/04/2017 0630   CREATININE 1.4 (H)  06/26/2017 1005   CREATININE 1.6 (H) 02/25/2017 1056      Component Value Date/Time   CALCIUM 8.5 (L) 07/04/2017 0630   CALCIUM 9.2 06/26/2017 1005   CALCIUM 9.3 02/25/2017 1056   ALKPHOS 99 (H) 06/26/2017 1005   ALKPHOS 91 02/25/2017 1056   AST 14 06/26/2017 1005   AST 19 02/25/2017 1056   ALT 13 06/26/2017 1005   ALT 23 02/25/2017 1056   BILITOT 0.70 06/26/2017 1005   BILITOT 0.83 02/25/2017 1056      Impression and Plan: [name] [name] is a very  pleasant 83 yo caucasian female with chronic ITP. She is home from the hospital but still feeling fatigued and not eating or drinking well.  Her platelet count is 33 with a Hgb of 11.8. She has quite a bit of bruising and some petechiae.  I spoke with Dr. Marin Olp and we will cancel the Rituxan and give Decadron 20 mg IV and Pepcid 20 mg IV today instead.  She will then go home on 20 mg Decadron PO daily for 4 days and we will plan to see her back again for follow-up in 10 days.  [facility] at [facility] are good to contact our office with any questions or concerns. We can certainly see her sooner if need be.   Eliezer Bottom, NP 10/30/20188:49 AM

## 2017-07-20 ENCOUNTER — Encounter (HOSPITAL_COMMUNITY): Payer: Self-pay | Admitting: Emergency Medicine

## 2017-07-20 ENCOUNTER — Other Ambulatory Visit: Payer: Self-pay

## 2017-07-20 ENCOUNTER — Emergency Department (HOSPITAL_COMMUNITY): Payer: Medicare Other

## 2017-07-20 ENCOUNTER — Inpatient Hospital Stay (HOSPITAL_COMMUNITY)
Admission: EM | Admit: 2017-07-20 | Discharge: 2017-07-29 | DRG: 871 | Disposition: A | Discharge status: Skilled Nursing Facility | Payer: Medicare Other | Attending: Internal Medicine | Admitting: Internal Medicine

## 2017-07-20 DIAGNOSIS — T402X5A Adverse effect of other opioids, initial encounter: Secondary | ICD-10-CM | POA: Diagnosis present

## 2017-07-20 DIAGNOSIS — X58XXXA Exposure to other specified factors, initial encounter: Secondary | ICD-10-CM | POA: Diagnosis present

## 2017-07-20 DIAGNOSIS — I5032 Chronic diastolic (congestive) heart failure: Secondary | ICD-10-CM | POA: Diagnosis not present

## 2017-07-20 DIAGNOSIS — E119 Type 2 diabetes mellitus without complications: Secondary | ICD-10-CM

## 2017-07-20 DIAGNOSIS — M8580 Other specified disorders of bone density and structure, unspecified site: Secondary | ICD-10-CM | POA: Diagnosis present

## 2017-07-20 DIAGNOSIS — Z79899 Other long term (current) drug therapy: Secondary | ICD-10-CM

## 2017-07-20 DIAGNOSIS — Z794 Long term (current) use of insulin: Secondary | ICD-10-CM

## 2017-07-20 DIAGNOSIS — E1151 Type 2 diabetes mellitus with diabetic peripheral angiopathy without gangrene: Secondary | ICD-10-CM | POA: Diagnosis present

## 2017-07-20 DIAGNOSIS — Z8614 Personal history of Methicillin resistant Staphylococcus aureus infection: Secondary | ICD-10-CM

## 2017-07-20 DIAGNOSIS — N179 Acute kidney failure, unspecified: Secondary | ICD-10-CM | POA: Diagnosis not present

## 2017-07-20 DIAGNOSIS — Z862 Personal history of diseases of the blood and blood-forming organs and certain disorders involving the immune mechanism: Secondary | ICD-10-CM

## 2017-07-20 DIAGNOSIS — G92 Toxic encephalopathy: Secondary | ICD-10-CM | POA: Diagnosis present

## 2017-07-20 DIAGNOSIS — I5033 Acute on chronic diastolic (congestive) heart failure: Secondary | ICD-10-CM | POA: Diagnosis present

## 2017-07-20 DIAGNOSIS — Z6832 Body mass index (BMI) 32.0-32.9, adult: Secondary | ICD-10-CM

## 2017-07-20 DIAGNOSIS — I480 Paroxysmal atrial fibrillation: Secondary | ICD-10-CM | POA: Diagnosis present

## 2017-07-20 DIAGNOSIS — I482 Chronic atrial fibrillation, unspecified: Secondary | ICD-10-CM

## 2017-07-20 DIAGNOSIS — J189 Pneumonia, unspecified organism: Secondary | ICD-10-CM | POA: Diagnosis not present

## 2017-07-20 DIAGNOSIS — R238 Other skin changes: Secondary | ICD-10-CM | POA: Diagnosis present

## 2017-07-20 DIAGNOSIS — A419 Sepsis, unspecified organism: Secondary | ICD-10-CM | POA: Diagnosis not present

## 2017-07-20 DIAGNOSIS — S8012XA Contusion of left lower leg, initial encounter: Secondary | ICD-10-CM | POA: Diagnosis present

## 2017-07-20 DIAGNOSIS — D689 Coagulation defect, unspecified: Secondary | ICD-10-CM | POA: Diagnosis not present

## 2017-07-20 DIAGNOSIS — Z66 Do not resuscitate: Secondary | ICD-10-CM | POA: Diagnosis not present

## 2017-07-20 DIAGNOSIS — D696 Thrombocytopenia, unspecified: Secondary | ICD-10-CM

## 2017-07-20 DIAGNOSIS — I4891 Unspecified atrial fibrillation: Secondary | ICD-10-CM | POA: Diagnosis not present

## 2017-07-20 DIAGNOSIS — E1122 Type 2 diabetes mellitus with diabetic chronic kidney disease: Secondary | ICD-10-CM | POA: Diagnosis present

## 2017-07-20 DIAGNOSIS — R0600 Dyspnea, unspecified: Secondary | ICD-10-CM

## 2017-07-20 DIAGNOSIS — I878 Other specified disorders of veins: Secondary | ICD-10-CM | POA: Diagnosis present

## 2017-07-20 DIAGNOSIS — Z7901 Long term (current) use of anticoagulants: Secondary | ICD-10-CM

## 2017-07-20 DIAGNOSIS — D62 Acute posthemorrhagic anemia: Secondary | ICD-10-CM | POA: Diagnosis not present

## 2017-07-20 DIAGNOSIS — Z8249 Family history of ischemic heart disease and other diseases of the circulatory system: Secondary | ICD-10-CM

## 2017-07-20 DIAGNOSIS — R000 Tachycardia, unspecified: Secondary | ICD-10-CM | POA: Diagnosis present

## 2017-07-20 DIAGNOSIS — T148XXA Other injury of unspecified body region, initial encounter: Secondary | ICD-10-CM | POA: Diagnosis not present

## 2017-07-20 DIAGNOSIS — I2510 Atherosclerotic heart disease of native coronary artery without angina pectoris: Secondary | ICD-10-CM | POA: Diagnosis present

## 2017-07-20 DIAGNOSIS — M79605 Pain in left leg: Secondary | ICD-10-CM | POA: Diagnosis present

## 2017-07-20 DIAGNOSIS — E785 Hyperlipidemia, unspecified: Secondary | ICD-10-CM | POA: Diagnosis present

## 2017-07-20 DIAGNOSIS — E876 Hypokalemia: Secondary | ICD-10-CM | POA: Diagnosis present

## 2017-07-20 DIAGNOSIS — T45515A Adverse effect of anticoagulants, initial encounter: Secondary | ICD-10-CM | POA: Diagnosis present

## 2017-07-20 DIAGNOSIS — D693 Immune thrombocytopenic purpura: Secondary | ICD-10-CM | POA: Diagnosis not present

## 2017-07-20 DIAGNOSIS — S80822A Blister (nonthermal), left lower leg, initial encounter: Secondary | ICD-10-CM | POA: Diagnosis present

## 2017-07-20 DIAGNOSIS — Z86718 Personal history of other venous thrombosis and embolism: Secondary | ICD-10-CM

## 2017-07-20 DIAGNOSIS — J9600 Acute respiratory failure, unspecified whether with hypoxia or hypercapnia: Secondary | ICD-10-CM | POA: Diagnosis present

## 2017-07-20 DIAGNOSIS — Z881 Allergy status to other antibiotic agents status: Secondary | ICD-10-CM

## 2017-07-20 DIAGNOSIS — I130 Hypertensive heart and chronic kidney disease with heart failure and stage 1 through stage 4 chronic kidney disease, or unspecified chronic kidney disease: Secondary | ICD-10-CM | POA: Diagnosis present

## 2017-07-20 DIAGNOSIS — I872 Venous insufficiency (chronic) (peripheral): Secondary | ICD-10-CM | POA: Diagnosis present

## 2017-07-20 DIAGNOSIS — N183 Chronic kidney disease, stage 3 (moderate): Secondary | ICD-10-CM | POA: Diagnosis present

## 2017-07-20 DIAGNOSIS — Z7189 Other specified counseling: Secondary | ICD-10-CM | POA: Diagnosis not present

## 2017-07-20 DIAGNOSIS — E669 Obesity, unspecified: Secondary | ICD-10-CM | POA: Diagnosis present

## 2017-07-20 DIAGNOSIS — I252 Old myocardial infarction: Secondary | ICD-10-CM

## 2017-07-20 DIAGNOSIS — Z515 Encounter for palliative care: Secondary | ICD-10-CM | POA: Diagnosis not present

## 2017-07-20 LAB — CBC WITH DIFFERENTIAL/PLATELET
BASOS PCT: 0 %
Basophils Absolute: 0 10*3/uL (ref 0.0–0.1)
EOS ABS: 0 10*3/uL (ref 0.0–0.7)
Eosinophils Relative: 0 %
HCT: 35.1 % — ABNORMAL LOW (ref 36.0–46.0)
HEMOGLOBIN: 11 g/dL — AB (ref 12.0–15.0)
Lymphocytes Relative: 3 %
Lymphs Abs: 0.3 10*3/uL — ABNORMAL LOW (ref 0.7–4.0)
MCH: 27 pg (ref 26.0–34.0)
MCHC: 31.3 g/dL (ref 30.0–36.0)
MCV: 86 fL (ref 78.0–100.0)
MONOS PCT: 5 %
Monocytes Absolute: 0.6 10*3/uL (ref 0.1–1.0)
NEUTROS ABS: 12 10*3/uL — AB (ref 1.7–7.7)
Neutrophils Relative %: 93 %
Platelets: 160 10*3/uL (ref 150–400)
RBC: 4.08 MIL/uL (ref 3.87–5.11)
RDW: 19.6 % — ABNORMAL HIGH (ref 11.5–15.5)
WBC: 13 10*3/uL — ABNORMAL HIGH (ref 4.0–10.5)

## 2017-07-20 LAB — COMPREHENSIVE METABOLIC PANEL
ALBUMIN: 3.1 g/dL — AB (ref 3.5–5.0)
ALT: 18 U/L (ref 14–54)
ANION GAP: 9 (ref 5–15)
AST: 17 U/L (ref 15–41)
Alkaline Phosphatase: 69 U/L (ref 38–126)
BILIRUBIN TOTAL: 0.9 mg/dL (ref 0.3–1.2)
BUN: 45 mg/dL — AB (ref 6–20)
CHLORIDE: 103 mmol/L (ref 101–111)
CO2: 24 mmol/L (ref 22–32)
Calcium: 8.6 mg/dL — ABNORMAL LOW (ref 8.9–10.3)
Creatinine, Ser: 1.16 mg/dL — ABNORMAL HIGH (ref 0.44–1.00)
GFR calc Af Amer: 49 mL/min — ABNORMAL LOW (ref >60)
GFR calc non Af Amer: 42 mL/min — ABNORMAL LOW (ref >60)
GLUCOSE: 249 mg/dL — AB (ref 65–99)
POTASSIUM: 4.3 mmol/L (ref 3.5–5.1)
SODIUM: 136 mmol/L (ref 135–145)
Total Protein: 6.1 g/dL — ABNORMAL LOW (ref 6.5–8.1)

## 2017-07-20 LAB — I-STAT CHEM 8, ED
BUN: 42 mg/dL — AB (ref 6–20)
CALCIUM ION: 1.15 mmol/L (ref 1.15–1.40)
CHLORIDE: 102 mmol/L (ref 101–111)
CREATININE: 1.3 mg/dL — AB (ref 0.44–1.00)
GLUCOSE: 247 mg/dL — AB (ref 65–99)
HCT: 36 % (ref 36.0–46.0)
Hemoglobin: 12.2 g/dL (ref 12.0–15.0)
Potassium: 4.2 mmol/L (ref 3.5–5.1)
Sodium: 139 mmol/L (ref 135–145)
TCO2: 26 mmol/L (ref 22–32)

## 2017-07-20 LAB — I-STAT CG4 LACTIC ACID, ED
Lactic Acid, Venous: 2.17 mmol/L (ref 0.5–1.9)
Lactic Acid, Venous: 2.31 mmol/L (ref 0.5–1.9)

## 2017-07-20 LAB — APTT: aPTT: 46 seconds — ABNORMAL HIGH (ref 24–36)

## 2017-07-20 LAB — PREPARE RBC (CROSSMATCH)

## 2017-07-20 LAB — PROTIME-INR
INR: 1.27
PROTHROMBIN TIME: 15.8 s — AB (ref 11.4–15.2)

## 2017-07-20 LAB — MRSA PCR SCREENING: MRSA by PCR: POSITIVE — AB

## 2017-07-20 MED ORDER — POLYVINYL ALCOHOL 1.4 % OP SOLN
2.0000 [drp] | Freq: Two times a day (BID) | OPHTHALMIC | Status: DC
  Administered 2017-07-20 – 2017-07-29 (×17): 2 [drp] via OPHTHALMIC
  Filled 2017-07-20: qty 15

## 2017-07-20 MED ORDER — VANCOMYCIN HCL 10 G IV SOLR
1250.0000 mg | INTRAVENOUS | Status: DC
  Filled 2017-07-20: qty 1250

## 2017-07-20 MED ORDER — SODIUM CHLORIDE 0.9 % IV SOLN: Freq: Once | INTRAVENOUS | Status: DC

## 2017-07-20 MED ORDER — FENTANYL CITRATE (PF) 100 MCG/2ML IJ SOLN
INTRAMUSCULAR | Status: AC
  Administered 2017-07-20: 50 ug via INTRAVENOUS
  Filled 2017-07-20: qty 2

## 2017-07-20 MED ORDER — TRAMADOL HCL 50 MG PO TABS
50.0000 mg | ORAL_TABLET | Freq: Three times a day (TID) | ORAL | Status: DC
  Administered 2017-07-20 – 2017-07-29 (×26): 50 mg via ORAL
  Filled 2017-07-20 (×27): qty 1

## 2017-07-20 MED ORDER — DEXAMETHASONE 2 MG PO TABS: 20.0000 mg | ORAL_TABLET | Freq: Every day | ORAL | Status: DC

## 2017-07-20 MED ORDER — DIGOXIN 0.0625 MG HALF TABLET
0.0625 mg | ORAL_TABLET | Freq: Every day | ORAL | Status: DC
  Administered 2017-07-21 – 2017-07-29 (×9): 0.0625 mg via ORAL
  Filled 2017-07-20 (×9): qty 1

## 2017-07-20 MED ORDER — OXYCODONE HCL 5 MG PO TABS
10.0000 mg | ORAL_TABLET | Freq: Four times a day (QID) | ORAL | Status: DC | PRN
  Administered 2017-07-21 – 2017-07-29 (×13): 10 mg via ORAL
  Filled 2017-07-20 (×14): qty 2

## 2017-07-20 MED ORDER — SODIUM CHLORIDE 0.9 % IV SOLN
Freq: Once | INTRAVENOUS | Status: AC
  Administered 2017-07-20: 22:00:00 via INTRAVENOUS

## 2017-07-20 MED ORDER — VANCOMYCIN HCL 10 G IV SOLR
1750.0000 mg | INTRAVENOUS | Status: AC
  Administered 2017-07-20: 1750 mg via INTRAVENOUS
  Filled 2017-07-20: qty 1750

## 2017-07-20 MED ORDER — FUROSEMIDE 10 MG/ML IJ SOLN
40.0000 mg | Freq: Every day | INTRAMUSCULAR | Status: AC
  Filled 2017-07-20: qty 4

## 2017-07-20 MED ORDER — PIPERACILLIN-TAZOBACTAM 3.375 G IVPB 30 MIN
3.3750 g | INTRAVENOUS | Status: AC
  Administered 2017-07-20: 3.375 g via INTRAVENOUS
  Filled 2017-07-20: qty 50

## 2017-07-20 MED ORDER — FENTANYL CITRATE (PF) 100 MCG/2ML IJ SOLN
INTRAMUSCULAR | Status: AC
  Filled 2017-07-20: qty 2

## 2017-07-20 MED ORDER — FENTANYL CITRATE (PF) 100 MCG/2ML IJ SOLN
50.0000 ug | Freq: Once | INTRAMUSCULAR | Status: AC
  Administered 2017-07-20: 50 ug via INTRAVENOUS

## 2017-07-20 MED ORDER — SALINE SPRAY 0.65 % NA SOLN
1.0000 | Freq: Three times a day (TID) | NASAL | Status: DC
  Administered 2017-07-20 – 2017-07-29 (×24): 1 via NASAL
  Filled 2017-07-20: qty 44

## 2017-07-20 MED ORDER — PIPERACILLIN-TAZOBACTAM 3.375 G IVPB
3.3750 g | Freq: Three times a day (TID) | INTRAVENOUS | Status: DC
  Administered 2017-07-21 – 2017-07-25 (×14): 3.375 g via INTRAVENOUS
  Filled 2017-07-20 (×14): qty 50

## 2017-07-20 NOTE — ED Notes (Signed)
Dr. Zella Richer has just performed an extended exam on pt. Which involved him digitally exploring the very extensive wound at post. Left calf area. He was assisted by Dr. Ralene Bathe. She continues to have what appears to be moderate venous bleeding from this site. We are able to doppler bilat. D.p. Pulses; although all her bilat. Toes are dusky. Dr. Zella Richer applies a bulky sterile dressing to her left lower leg. Pt. Tolerates this well. We will transfuse asap.

## 2017-07-20 NOTE — ED Provider Notes (Signed)
[facility] DEPT Provider Note   CSN: [id number] Arrival date & time: 07/20/17  1154     History   Chief Complaint Chief Complaint  Patient presents with  . Leg Pain    HPI [name] H Mccleave is a 83 y.o. female.  The history is provided by the patient and the EMS personnel. No language interpreter was used.  Leg Pain     [name] [name] is a 83 y.o. female who presents to the Emergency Department complaining of leg pain.  Level 5 caveat due to altered mental status.  She presents from [facility] facility for evaluation of bleeding and bruising in the left lower extremity.  Her left calf has tripled in size this morning with severe pain.  She received 100 mcg of fentanyl prior to ED arrival.  Patient reports that she has pain all over and is unable to express additional symptoms.  No reports of trauma. Past Medical History:  Diagnosis Date  . Atrial fibrillation ([place])   . CAD (coronary artery disease)    a. NSTEMI 2/14 => LHC 10/20/12:  dLM 20%, pLAD 99%, mLAD 60% followed by 99%, oD1 99%, and pD2 30%, mild plaque disease in the CFX and RCA. PCI: Xience Xpedition DES to the proximal and mid LAD  . Chronic diastolic CHF (congestive heart failure) ([place])    Echo (08/10/13): Mild LVH, EF 60-65%.  . DJD (degenerative joint disease)   . Dyslipidemia   . HTN (hypertension)   . Osteopenia   . Thrombocytopenia ([place])   . Venous ulcer [facility])     Patient Active Problem List   Diagnosis Date Noted  . Adult failure to thrive   . DNR (do not resuscitate)   . Acute lower UTI   . Acute encephalopathy 01/18/2017  . Chronic diastolic heart failure ([place]) 01/18/2017  . Chronic respiratory failure with hypoxia ([place]) 01/18/2017  . CKD (chronic kidney disease), stage III ([place]) 11/21/2016  . Obesity (BMI 30.0-34.9) 11/21/2016  . Venous stasis ulcer ([place]) 11/21/2016  . Venous stasis dermatitis of both lower extremities 11/21/2016  . MRSA carrier 11/21/2016  .  Acute on chronic diastolic CHF (congestive heart failure) ([place]) 11/20/2016  . Goals of care, counseling/discussion   . Palliative care by specialist   . Chronic ITP (idiopathic thrombocytopenia) ([place]) 10/01/2016  . Gangrene ([place])   . Acute on chronic respiratory failure with hypoxia ([place])   . Coronary atherosclerosis of native coronary artery 10/09/2013  . Atrial fibrillation ([place]) 10/09/2013  . Leukocytosis 08/11/2013  . Chronic narcotic dependence ([place]) 07/16/2013  . Varicose veins of lower extremities with other complications 60/73/7106  . Edema 05/14/2013  . Hypokalemia 12/20/2012  . CAD (coronary artery disease) of artery bypass graft 12/18/2012  . Unstable angina ([place]) 10/18/2012  . Thrombocytopenia ([place]) 10/18/2012  . HTN (hypertension) 10/17/2012  . Chronic back pain 10/17/2012  . Precordial pain 10/17/2012    Past Surgical History:  Procedure Laterality Date  . BACK SURGERY     Lumbar disc  . EYE SURGERY     Blepharoplasty  . HIP SURGERY     R THR  . INSERTION / PLACEMENT / REVISION NEUROSTIMULATOR    . IR FLUORO GUIDE CV LINE RIGHT  06/25/2017  . IR US GUIDE VASC ACCESS RIGHT  06/25/2017  . JOINT REPLACEMENT    . LEFT HEART CATHETERIZATION WITH CORONARY ANGIOGRAM N/A 10/20/2012   Procedure: LEFT HEART CATHETERIZATION WITH CORONARY ANGIOGRAM;  Surgeon: Burnell Blanks, MD;  Location:  [facility] CATH LAB;  Service: Cardiovascular;  Laterality: N/A;    OB History    No data available       Home Medications    Prior to Admission medications   Medication Sig Start Date End Date Taking? Authorizing Provider  Calcium Carbonate-Vitamin D (CALCIUM 600+D) 600-200 MG-UNIT TABS Take 2 tablets by mouth daily.   Yes [provider]  dexamethasone (DECADRON) 2 MG tablet Take 10 tablets (20 mg total) by mouth daily. For 4 days. 07/16/17  Yes [place], Holli Humbles, NP  digoxin (LANOXIN) 0.125 MG tablet Take 0.0625 mg by mouth daily.   Yes [provider]    diltiazem (CARDIZEM CD) 240 MG 24 hr capsule Take 1 capsule (240 mg total) by mouth daily. 08/18/13  Yes Velvet Bathe, MD  enoxaparin (LOVENOX) 100 MG/ML injection Inject 0.9 mLs (90 mg total) into the skin 2 (two) times daily. 07/04/17  Yes Patrecia Pour, MD  famciclovir (FAMVIR) 250 MG tablet Take 1 tablet (250 mg total) by mouth daily. 08/03/16  Yes [place], Holli Humbles, NP  famotidine (PEPCID) 20 MG tablet Take 20 mg by mouth 2 (two) times daily.  06/24/17  Yes [provider]  gabapentin (NEURONTIN) 300 MG capsule Take 300 mg by mouth 2 (two) times daily.  05/29/17  Yes [provider]  insulin aspart (NOVOLOG FLEXPEN) 100 UNIT/ML FlexPen Inject into the skin 3 (three) times daily with meals. SSI: inject as needed for glucose control: BS <120: 0 units BS 121-150: 2 units BS 151-200: 3 units BS 201-250: 5 units BS 251-300: 8 units BS 301-350: 11 units BS 351-400: 15 units   Yes [provider]  LEVEMIR 100 UNIT/ML injection Inject 6 Units into the skin at bedtime.  05/17/17  Yes [provider]  metoprolol (LOPRESSOR) 100 MG tablet Take 1 tablet (100 mg total) by mouth 2 (two) times daily. 08/18/13  Yes Velvet Bathe, MD  Oxycodone HCl 10 MG TABS Take 1 tablet (10 mg total) by mouth every 6 (six) hours as needed (pain). 07/04/17  Yes Patrecia Pour, MD  Polyethyl Glycol-Propyl Glycol (SYSTANE) 0.4-0.3 % SOLN Place 2 drops into both eyes 2 (two) times daily.   Yes [provider]  potassium chloride SA (K-DUR,KLOR-CON) 20 MEQ tablet Take 1 tablet (20 mEq total) by mouth 2 (two) times daily. 08/28/13  Yes [place], Scott T, PA-C  Probiotic Product (RISA-BID PROBIOTIC PO) Take 1 tablet by mouth daily.   Yes [provider]  sitaGLIPtin (JANUVIA) 50 MG tablet Take 50 mg by mouth daily.   Yes [provider]  sodium chloride (OCEAN) 0.65 % SOLN nasal spray Place 1 spray into both nostrils 3 (three) times daily.    Yes [provider]  torsemide (DEMADEX) 20 MG tablet Take 40 mg by mouth daily.  05/29/17  Yes [provider]  traMADol (ULTRAM) 50 MG tablet Take 1 tablet (50 mg total) by mouth 3 (three) times daily. 07/04/17  Yes Patrecia Pour, MD    Family History Family History  Problem Relation Age of Onset  . CAD Father 71       Vague history     Social History Social History  Substance Use Topics  . Smoking status: Never Smoker  . Smokeless tobacco: Never Used  . Alcohol use No     Allergies   Keflex [cephalexin]   Review of Systems Review of Systems  All other systems reviewed and are negative.  Physical Exam Updated Vital Signs BP 131/72   Pulse 99   Temp 97.6 F (36.4 C) (Axillary)   Resp 14   SpO2 100%   Physical Exam  Constitutional: She appears well-developed and well-nourished. She appears distressed.  HENT:  Head: Normocephalic.  Cardiovascular:  Tachycardic and irregular  Pulmonary/Chest: Effort normal and breath sounds normal. No respiratory distress.  Abdominal: Soft. There is no tenderness.  Musculoskeletal:  Faint DP pulses bilaterally.  There is extensive ecchymosis to the left lower extremity with large tense hemorrhagic bullae to the posterior calf.  There is severe tenderness to palpation throughout the calf.  There is pain with passive range of motion in the left foot.  Neurological: She is alert.  Disoriented to place and time.  Moves all extremities.  Skin: Skin is warm and dry. Capillary refill takes less than 2 seconds.  Multiple areas of ecchymosis with hemorrhagic bullae  Psychiatric:  Unable to assess  Nursing note and vitals reviewed.    ED Treatments / Results  Labs (all labs ordered are listed, but only abnormal results are displayed) Labs Reviewed  COMPREHENSIVE METABOLIC PANEL - Abnormal; Notable for the following:       Result Value   Glucose, Bld 249 (*)    BUN 45 (*)    Creatinine, Ser 1.16 (*)    Calcium 8.6 (*)    Total  Protein 6.1 (*)    Albumin 3.1 (*)    GFR calc non Af Amer 42 (*)    GFR calc Af Amer 49 (*)    All other components within normal limits  CBC WITH DIFFERENTIAL/PLATELET - Abnormal; Notable for the following:    WBC 13.0 (*)    Hemoglobin 11.0 (*)    HCT 35.1 (*)    RDW 19.6 (*)    Neutro Abs 12.0 (*)    Lymphs Abs 0.3 (*)    All other components within normal limits  I-STAT CHEM 8, ED - Abnormal; Notable for the following:    BUN 42 (*)    Creatinine, Ser 1.30 (*)    Glucose, Bld 247 (*)    All other components within normal limits  I-STAT CG4 LACTIC ACID, ED - Abnormal; Notable for the following:    Lactic Acid, Venous 2.17 (*)    All other components within normal limits  APTT  PROTIME-INR  CBG MONITORING, ED  I-STAT CG4 LACTIC ACID, ED  TYPE AND SCREEN    EKG  EKG Interpretation  Date/Time:  Saturday July 20 2017 12:23:45 EDT Ventricular Rate:  100 PR Interval:    QRS Duration: 106 QT Interval:  328 QTC Calculation: 423 R Axis:   136 Text Interpretation:  Atrial fibrillation Right axis deviation Anteroseptal infarct, old Artifact in lead(s) I II aVR aVL Confirmed by Quintella Reichert [phone]) on 07/20/2017 12:27:41 PM       Radiology Dg Tibia/fibula Left  Result Date: 07/20/2017 CLINICAL DATA:  New left lower extremity wound since this morning. The wound has tripled in size since this morning. EXAM: LEFT TIBIA AND FIBULA - 2 VIEW COMPARISON:  08/10/2013. FINDINGS: Large proximal left lower leg soft tissue ulceration with surrounding bandage material. Multiple loculations of associated gas. No periosteal reaction or bone destruction. Diffuse soft tissue swelling. Atheromatous arterial calcifications. Extensive left knee degenerative changes. IMPRESSION: 1. Large proximal left lower leg soft tissue ulceration with associated gas, suspicious for a gas-forming organism. 2. No evidence of underlying osteomyelitis. 3. Marked left knee degenerative changes. Electronically  Signed  By: Claudie Revering M.D.   On: 07/20/2017 13:39   Ct Head Wo Contrast  Result Date: 07/20/2017 CLINICAL DATA:  Altered mental status today. EXAM: CT HEAD WITHOUT CONTRAST TECHNIQUE: Contiguous axial images were obtained from the base of the skull through the vertex without intravenous contrast. COMPARISON:  06/26/2017. FINDINGS: Brain: Diffusely enlarged ventricles and subarachnoid spaces. Patchy white matter low density in both cerebral hemispheres. No intracranial hemorrhage, mass lesion or CT evidence of acute infarction. Vascular: No hyperdense vessel or unexpected calcification. Skull: Normal. Negative for fracture or focal lesion. Sinuses/Orbits: Status post bilateral cataract extraction. Unremarkable paranasal sinuses. Other: None. IMPRESSION: 1. No acute abnormality. 2. Stable diffuse cerebral and cerebellar atrophy and chronic small vessel white matter ischemic changes in both cerebral hemispheres. Electronically Signed   By: Claudie Revering M.D.   On: 07/20/2017 14:53   Dg Hip Unilat W Or Wo Pelvis 2-3 Views Left  Result Date: 07/20/2017 CLINICAL DATA:  Large left leg wound that has tripled in size since this morning. EXAM: DG HIP (WITH OR WITHOUT PELVIS) 2-3V LEFT COMPARISON:  Abdomen and pelvis CT dated 12/20/2012. FINDINGS: A right hip bipolar prosthesis is again demonstrated as well as lower lumbar spine laminectomy defects and fixation hardware. Left hip degenerative changes with marked superior joint space narrowing and some subarticular sclerosis and cyst formation, with mild progression. Interval ossification in the region of the iliopsoas tendon attachment. No fracture or dislocation. Atheromatous arterial calcifications. IMPRESSION: 1. Moderate to marked left hip degenerative changes with mild progression. 2. Interval calcification in the region of the distal iliopsoas tendon. 3. No acute abnormality. Electronically Signed   By: Claudie Revering M.D.   On: 07/20/2017 13:43     Procedures Procedures (including critical care time) CRITICAL CARE Performed by: Quintella Reichert   Total critical care time: 60 minutes  Critical care time was exclusive of separately billable procedures and treating other patients.  Critical care was necessary to treat or prevent imminent or life-threatening deterioration.  Critical care was time spent personally by me on the following activities: development of treatment plan with patient and/or surrogate as well as nursing, discussions with consultants, evaluation of patient's response to treatment, examination of patient, obtaining history from patient or surrogate, ordering and performing treatments and interventions, ordering and review of laboratory studies, ordering and review of radiographic studies, pulse oximetry and re-evaluation of patient's condition.  Medications Ordered in ED Medications - No data to display   Initial Impression / Assessment and Plan / ED Course  I have reviewed the triage vital signs and the nursing notes.  Pertinent labs & imaging results that were available during my care of the patient were reviewed by me and considered in my medical decision making (see chart for details).     Patient with history of ITP here with rapidly expanding hematoma to the left calf.  On initial assessment patient with severe pain concern for compartment syndrome.  Bulla on posterior calf spontaneously ruptured during ED stay and her pain resolved.  She did remain persistently confused but there is for no further evidence of compartment syndrome based on her examination.  X-ray does show ulceration with gas present, current clinical picture is not consistent with necrotizing infection given significant improvement in patient's symptoms and purely bloody drainage from her bulla.  Hospitalist consulted for admission for observation given her change in mental status-question secondary to medications administered by EMS.  Also  requests observation given large amount of hemorrhage that occurred in  ED, now stable.  Called to bedside, patient with recurrent bleeding from posterior calf wound.  Attempted to clear clots and visualize source of bleeding but unable to visualize.  Tourniquet was applied to the thigh for pressure control and general surgery was consulted for assistance and hemorrhage control.  She was evaluated by Dr. Zella Richer with general surgery and the wound was packed with compressive dressing applied.  He recommends medicine admission and antibiotics to cover skin infection given contaminated wound.    Patient lost multiple units of blood in the emergency department, will transfuse 2 units and reassess.    Attempted to contact her nursing facility with no answer.  Attempted to contact her son Zenia Resides with no answer.  Final Clinical Impressions(s) / ED Diagnoses   Final diagnoses:  None    New Prescriptions New Prescriptions   No medications on file     Quintella Reichert, MD 07/20/17 [id number]

## 2017-07-20 NOTE — ED Notes (Signed)
Upon attempting to obtain second lactic acid, a large amount of blood was noted on patient's wound dressing. MD called to bedside.

## 2017-07-20 NOTE — Progress Notes (Signed)
Spoke with oncology who recommended FFP x2 units for bleeding Hold Lovenox

## 2017-07-20 NOTE — Progress Notes (Signed)
By nursing staff and ICU. Dressing is saturated and they are reinforcing. I spoke with Dr. Maudie Mercury about giving platelets and factor.  Procedure-dressing change at bedside  Previous left lower extremity dressing was removed. It was saturated. Packing was removed. Once again a diffuse venous ooze was coming from the wound. There is no specific arterial site of bleeding or no specific blood vessel identified that was bleeding. Combat gauzes and packed tightly into the wound. This is followed by Kerlix, ABD pads, and an Ace wrap.  Once again, this is a medical bleed from therapeutic Lovenox. We will need to support with blood products and wait for Lovenox to wear off.

## 2017-07-20 NOTE — ED Triage Notes (Addendum)
Per EMS, patient from [facility], c/o new wound to LLE since this morning. Staff reports wound has tripled in size since this morning. Hx DVT. Non ambulatory. A&O.  20 R Hand 132mg Fentanyl with EMS  BP 188/98 HR 100 Hx a fib O2 98% 2L

## 2017-07-20 NOTE — Progress Notes (Signed)
Pharmacy Antibiotic Note  [name] [name] is a 83 y.o. female admitted on 07/20/2017 with wound infection.  Pharmacy has been consulted for Vancomycin and Zosyn dosing. Noted allergy to cephalexin, but patient has tolerated Zosyn on previous admission.  Plan: Vancomycin 1787m IV x 1, then 12565mIV q48h. Plan for Vancomycin levels at steady state, as indicated. Zosyn 3.375g IV x 1 over 30 minutes, then Zosyn 3.375g IV q8h (infuse over 4 hours). Monitor renal function (daily SCr), cultures, clinical course.   Height: _0  (162.6 cm) Weight: 185 lb 13.6 oz (84.3 kg) IBW/kg (Calculated) : 54.7  Temp (24hrs), Avg:97.4 F (36.3 C), Min:96.3 F (35.7 C), Max:97.9 F (36.6 C)   Recent Labs Lab 07/16/17 0840 07/20/17 1221 07/20/17 1247 07/20/17 1707  WBC 9.1 13.0*  --   --   CREATININE 1.1 1.16* 1.30*  --   LATICACIDVEN  --   --  2.17* 2.31*    Estimated Creatinine Clearance: 33.8 mL/min (A) (by C-G formula based on SCr of 1.3 mg/dL (H)).    Allergies  Allergen Reactions  . Keflex [Cephalexin] Other (See Comments)    Reaction:  Unknown     Antimicrobials this admission: 11/3 >> Vancomycin >> 11/3 >> Zosyn >>  Microbiology results: 11/3 BCx: sent 11/3 MRSA PCR: sent  Thank you for allowing pharmacy to be a part of this patient's care.   JiLindell SparPharmD, BCPS Pager: [phone]/11/2016 8:18 PM

## 2017-07-20 NOTE — Consult Note (Signed)
Reason for Consult: Uncontrollable bleeding from left lower leg wound Referring Physician: Dr. Jacquelynn Cree is an 83 y.o. female.  HPI: This is an 83 year old female who presented to the emergency department with a one-day history of leg pain and swelling. She is from [facility] rehabilitation facility. By report, the left hip area significantly increased in size and she had severe pain. On arrival to the emergency department, extensive bruising to the left lower leg was noted with a large tense hemorrhagic bulla. This bulla ruptured and bleeding ensued. The bleeding was not able to be controlled and I was asked to see her emergently. She is on chronic Lovenox for recent upper extremity DVT and atrial fibrillation. She also has ITP as been treated with high-dose steroids which she has responded to. Platelet count is normal today. She denies any trauma to the area. She has received intravenous fentanyl so the history is from Dr. Ralene Bathe.  Past Medical History:  Diagnosis Date  . Atrial fibrillation ([place])   . CAD (coronary artery disease)    a. NSTEMI 2/14 => LHC 10/20/12:  dLM 20%, pLAD 99%, mLAD 60% followed by 99%, oD1 99%, and pD2 30%, mild plaque disease in the CFX and RCA. PCI: Xience Xpedition DES to the proximal and mid LAD  . Chronic diastolic CHF (congestive heart failure) ([place])    Echo (08/10/13): Mild LVH, EF 60-65%.  . DJD (degenerative joint disease)   . Dyslipidemia   . HTN (hypertension)   . Osteopenia   . Thrombocytopenia ([place])   . Venous ulcer ([place])     Past Surgical History:  Procedure Laterality Date  . BACK SURGERY     Lumbar disc  . EYE SURGERY     Blepharoplasty  . HIP SURGERY     R THR  . INSERTION / PLACEMENT / REVISION NEUROSTIMULATOR    . IR FLUORO GUIDE CV LINE RIGHT  06/25/2017  . IR US GUIDE VASC ACCESS RIGHT  06/25/2017  . JOINT REPLACEMENT    . LEFT HEART CATHETERIZATION WITH CORONARY ANGIOGRAM N/A 10/20/2012   Procedure: LEFT HEART CATHETERIZATION  WITH CORONARY ANGIOGRAM;  Surgeon: Burnell Blanks, MD;  Location: [facility] CATH LAB;  Service: Cardiovascular;  Laterality: N/A;    Family History  Problem Relation Age of Onset  . CAD Father 65       Vague history     Social History:  reports that she has never smoked. She has never used smokeless tobacco. She reports that she does not drink alcohol or use drugs.  Allergies:  Allergies  Allergen Reactions  . Keflex [Cephalexin] Other (See Comments)    Reaction:  Unknown     Prior to Admission medications   Medication Sig Start Date End Date Taking? Authorizing Provider  Calcium Carbonate-Vitamin D (CALCIUM 600+D) 600-200 MG-UNIT TABS Take 2 tablets by mouth daily.   Yes [provider]  dexamethasone (DECADRON) 2 MG tablet Take 10 tablets (20 mg total) by mouth daily. For 4 days. 07/16/17  Yes [place], Holli Humbles, NP  digoxin (LANOXIN) 0.125 MG tablet Take 0.0625 mg by mouth daily.   Yes [provider]  diltiazem (CARDIZEM CD) 240 MG 24 hr capsule Take 1 capsule (240 mg total) by mouth daily. 08/18/13  Yes Velvet Bathe, MD  enoxaparin (LOVENOX) 100 MG/ML injection Inject 0.9 mLs (90 mg total) into the skin 2 (two) times daily. 07/04/17  Yes Patrecia Pour, MD  famciclovir (FAMVIR) 250 MG tablet Take 1 tablet (250  mg total) by mouth daily. 08/03/16  Yes [place], Holli Humbles, NP  famotidine (PEPCID) 20 MG tablet Take 20 mg by mouth 2 (two) times daily.  06/24/17  Yes [provider]  gabapentin (NEURONTIN) 300 MG capsule Take 300 mg by mouth 2 (two) times daily.  05/29/17  Yes [provider]  insulin aspart (NOVOLOG FLEXPEN) 100 UNIT/ML FlexPen Inject into the skin 3 (three) times daily with meals. SSI: inject as needed for glucose control: BS <120: 0 units BS 121-150: 2 units BS 151-200: 3 units BS 201-250: 5 units BS 251-300: 8 units BS 301-350: 11 units BS 351-400: 15 units   Yes [provider]  LEVEMIR 100 UNIT/ML injection Inject 6  Units into the skin at bedtime.  05/17/17  Yes [provider]  metoprolol (LOPRESSOR) 100 MG tablet Take 1 tablet (100 mg total) by mouth 2 (two) times daily. 08/18/13  Yes Velvet Bathe, MD  Oxycodone HCl 10 MG TABS Take 1 tablet (10 mg total) by mouth every 6 (six) hours as needed (pain). 07/04/17  Yes Patrecia Pour, MD  Polyethyl Glycol-Propyl Glycol (SYSTANE) 0.4-0.3 % SOLN Place 2 drops into both eyes 2 (two) times daily.   Yes [provider]  potassium chloride SA (K-DUR,KLOR-CON) 20 MEQ tablet Take 1 tablet (20 mEq total) by mouth 2 (two) times daily. 08/28/13  Yes [place], Scott T, PA-C  Probiotic Product (RISA-BID PROBIOTIC PO) Take 1 tablet by mouth daily.   Yes [provider]  sitaGLIPtin (JANUVIA) 50 MG tablet Take 50 mg by mouth daily.   Yes [provider]  sodium chloride (OCEAN) 0.65 % SOLN nasal spray Place 1 spray into both nostrils 3 (three) times daily.    Yes [provider]  torsemide (DEMADEX) 20 MG tablet Take 40 mg by mouth daily.  05/29/17  Yes [provider]  traMADol (ULTRAM) 50 MG tablet Take 1 tablet (50 mg total) by mouth 3 (three) times daily. 07/04/17  Yes Patrecia Pour, MD     Results for orders placed or performed during the hospital encounter of 07/20/17 (from the past 48 hour(s))  Comprehensive metabolic panel     Status: Abnormal   Collection Time: 07/20/17 12:21 PM  Result Value Ref Range   Sodium 136 135 - 145 mmol/L   Potassium 4.3 3.5 - 5.1 mmol/L   Chloride 103 101 - 111 mmol/L   CO2 24 22 - 32 mmol/L   Glucose, Bld 249 (H) 65 - 99 mg/dL   BUN 45 (H) 6 - 20 mg/dL   Creatinine, Ser 1.16 (H) 0.44 - 1.00 mg/dL   Calcium 8.6 (L) 8.9 - 10.3 mg/dL   Total Protein 6.1 (L) 6.5 - 8.1 g/dL   Albumin 3.1 (L) 3.5 - 5.0 g/dL   AST 17 15 - 41 U/L   ALT 18 14 - 54 U/L   Alkaline Phosphatase 69 38 - 126 U/L   Total Bilirubin 0.9 0.3 - 1.2 mg/dL   GFR calc non Af Amer 42 (L) >60 mL/min   GFR calc Af Amer  49 (L) >60 mL/min    Comment: (NOTE) The eGFR has been calculated using the CKD EPI equation. This calculation has not been validated in all clinical situations. eGFR's persistently <60 mL/min signify possible Chronic Kidney Disease.    Anion gap 9 5 - 15  CBC with Differential     Status: Abnormal   Collection Time: 07/20/17 12:21 PM  Result Value Ref Range  WBC 13.0 (H) 4.0 - 10.5 K/uL   RBC 4.08 3.87 - 5.11 MIL/uL   Hemoglobin 11.0 (L) 12.0 - 15.0 g/dL   HCT 35.1 (L) 36.0 - 46.0 %   MCV 86.0 78.0 - 100.0 fL   MCH 27.0 26.0 - 34.0 pg   MCHC 31.3 30.0 - 36.0 g/dL   RDW 19.6 (H) 11.5 - 15.5 %   Platelets 160 150 - 400 K/uL   Neutrophils Relative % 93 %   Neutro Abs 12.0 (H) 1.7 - 7.7 K/uL   Lymphocytes Relative 3 %   Lymphs Abs 0.3 (L) 0.7 - 4.0 K/uL   Monocytes Relative 5 %   Monocytes Absolute 0.6 0.1 - 1.0 K/uL   Eosinophils Relative 0 %   Eosinophils Absolute 0.0 0.0 - 0.7 K/uL   Basophils Relative 0 %   Basophils Absolute 0.0 0.0 - 0.1 K/uL  APTT     Status: Abnormal   Collection Time: 07/20/17 12:21 PM  Result Value Ref Range   aPTT 46 (H) 24 - 36 seconds    Comment:        IF BASELINE aPTT IS ELEVATED, SUGGEST PATIENT RISK ASSESSMENT BE USED TO DETERMINE APPROPRIATE ANTICOAGULANT THERAPY.   Protime-INR     Status: Abnormal   Collection Time: 07/20/17 12:21 PM  Result Value Ref Range   Prothrombin Time 15.8 (H) 11.4 - 15.2 seconds   INR 1.27   Type and screen [place]     Status: None (Preliminary result)   Collection Time: 07/20/17 12:31 PM  Result Value Ref Range   ABO/RH(D) A POS    Antibody Screen NEG    Sample Expiration 07/23/2017    Unit Number [id number]    Blood Component Type RED CELLS,LR    Unit division 00    Status of Unit ALLOCATED    Transfusion Status OK TO TRANSFUSE    Crossmatch Result Compatible    Unit Number [id number]    Blood Component Type RED CELLS,LR    Unit division 00    Status of Unit ALLOCATED     Transfusion Status OK TO TRANSFUSE    Crossmatch Result Compatible    Unit Number [id number]    Blood Component Type RED CELLS,LR    Unit division 00    Status of Unit ALLOCATED    Transfusion Status OK TO TRANSFUSE    Crossmatch Result Compatible    Unit Number [id number]    Blood Component Type RED CELLS,LR    Unit division 00    Status of Unit ISSUED    Transfusion Status OK TO TRANSFUSE    Crossmatch Result Compatible   I-stat Chem 8, ED     Status: Abnormal   Collection Time: 07/20/17 12:47 PM  Result Value Ref Range   Sodium 139 135 - 145 mmol/L   Potassium 4.2 3.5 - 5.1 mmol/L   Chloride 102 101 - 111 mmol/L   BUN 42 (H) 6 - 20 mg/dL   Creatinine, Ser 1.30 (H) 0.44 - 1.00 mg/dL   Glucose, Bld 247 (H) 65 - 99 mg/dL   Calcium, Ion 1.15 1.15 - 1.40 mmol/L   TCO2 26 22 - 32 mmol/L   Hemoglobin 12.2 12.0 - 15.0 g/dL   HCT 36.0 36.0 - 46.0 %  I-Stat CG4 Lactic Acid, ED     Status: Abnormal   Collection Time: 07/20/17 12:47 PM  Result Value Ref Range   Lactic Acid, Venous 2.17 (HH) 0.5 - 1.9 mmol/L  Comment NOTIFIED PHYSICIAN   Prepare RBC     Status: None   Collection Time: 07/20/17  4:00 PM  Result Value Ref Range   Order Confirmation ORDER PROCESSED BY BLOOD BANK     Dg Tibia/fibula Left  Result Date: 07/20/2017 CLINICAL DATA:  New left lower extremity wound since this morning. The wound has tripled in size since this morning. EXAM: LEFT TIBIA AND FIBULA - 2 VIEW COMPARISON:  08/10/2013. FINDINGS: Large proximal left lower leg soft tissue ulceration with surrounding bandage material. Multiple loculations of associated gas. No periosteal reaction or bone destruction. Diffuse soft tissue swelling. Atheromatous arterial calcifications. Extensive left knee degenerative changes. IMPRESSION: 1. Large proximal left lower leg soft tissue ulceration with associated gas, suspicious for a gas-forming organism. 2. No evidence of underlying osteomyelitis. 3. Marked left knee  degenerative changes. Electronically Signed   By: Claudie Revering M.D.   On: 07/20/2017 13:39   Ct Head Wo Contrast  Result Date: 07/20/2017 CLINICAL DATA:  Altered mental status today. EXAM: CT HEAD WITHOUT CONTRAST TECHNIQUE: Contiguous axial images were obtained from the base of the skull through the vertex without intravenous contrast. COMPARISON:  06/26/2017. FINDINGS: Brain: Diffusely enlarged ventricles and subarachnoid spaces. Patchy white matter low density in both cerebral hemispheres. No intracranial hemorrhage, mass lesion or CT evidence of acute infarction. Vascular: No hyperdense vessel or unexpected calcification. Skull: Normal. Negative for fracture or focal lesion. Sinuses/Orbits: Status post bilateral cataract extraction. Unremarkable paranasal sinuses. Other: None. IMPRESSION: 1. No acute abnormality. 2. Stable diffuse cerebral and cerebellar atrophy and chronic small vessel white matter ischemic changes in both cerebral hemispheres. Electronically Signed   By: Claudie Revering M.D.   On: 07/20/2017 14:53   Dg Hip Unilat W Or Wo Pelvis 2-3 Views Left  Result Date: 07/20/2017 CLINICAL DATA:  Large left leg wound that has tripled in size since this morning. EXAM: DG HIP (WITH OR WITHOUT PELVIS) 2-3V LEFT COMPARISON:  Abdomen and pelvis CT dated 12/20/2012. FINDINGS: A right hip bipolar prosthesis is again demonstrated as well as lower lumbar spine laminectomy defects and fixation hardware. Left hip degenerative changes with marked superior joint space narrowing and some subarticular sclerosis and cyst formation, with mild progression. Interval ossification in the region of the iliopsoas tendon attachment. No fracture or dislocation. Atheromatous arterial calcifications. IMPRESSION: 1. Moderate to marked left hip degenerative changes with mild progression. 2. Interval calcification in the region of the distal iliopsoas tendon. 3. No acute abnormality. Electronically Signed   By: Claudie Revering M.D.    On: 07/20/2017 13:43    Review of Systems  Unable to perform ROS: Acuity of condition  Sedated with Fentanyl Blood pressure (!) 129/59, pulse 89, temperature 97.6 F (36.4 C), temperature source Axillary, resp. rate 12, SpO2 100 %. Physical Exam  GENERAL APPEARANCE:  Obese elderly female that is sedated. She occasionally moans in discomfort.  MUSCULOSKELETAL Chronic lower extremity venous stasis changes are noted. Multiple bruises are noted on the right thigh. A large ecchymotic areas noted beginning in the proximal left posterior calf and extending inferiorly and superior of the posterior left thigh. There is approximately 15 cm open wound with degloving of skin. There is a continuous ooze of dark blood but no pulsatile bleeding. I subsequently packed this with a large piece of Gelfoam followed by Kerlix roll and a tight dressing. The leg was elevated. This controlled the bleeding.  LYMPHATIC: No palpable cervical, supraclavicular, axillary adenopathy.  SKIN Multiple bruises and abdominal wall, arms,  legs.  NEUROLOGIC speech:  Slurred, likely secondary to fentanyl   Assessment/Plan: Spontaneous bleeding left calf with large bulla that ruptured and left a skin flap. Diffuse ooze from the area. Most likely related to Lovenox treatment. Bleeding has been controlled with a large piece of Gelfoam and a tight pack dressing.  Recommendation: Hold Lovenox. Do not change dressing for 48 hours. Would cover with intravenous antibiotics given the acuity of the situation and the fact that she has a contaminated wound. We'll follow while she is in the hospital.  Kylia Grajales J 07/20/2017, 4:11 PM

## 2017-07-20 NOTE — ED Notes (Signed)
She remains in no distress; and I take her to ICU at this time.

## 2017-07-20 NOTE — ED Notes (Addendum)
Patient saturated in blood. MD called to bedside. Blood determined to be from wound on patient's left leg. Pressure dressing applied. Full linen change complete.

## 2017-07-20 NOTE — Progress Notes (Signed)
Pt arrived to 1223. Bed saturated with blood, through pressure dressing, pads, pillows, and sheets. Surgery notified. Orders given to keep dressing in place, do not remove, continue to reinforce with ABDs and kerlex and elevate the extremity. Dressing reinforced with 5 ABD pads and 1 kerlex and 1 Ace bandage. Extremity elevated. Pt alert and oriented. VS stable. Will continue to monitor.

## 2017-07-20 NOTE — ED Notes (Signed)
Patient transported to X-ray 

## 2017-07-20 NOTE — ED Notes (Signed)
General surgery at bedside.

## 2017-07-20 NOTE — H&P (Addendum)
History and Physical  [name] [name] DOB: 12-Sep-1931 DOA: 07/20/2017  Referring physician: EDP PCP: Lajean Manes, MD   Chief Complaint: Rapidly enlarging leg wound ,altered mental status  HPI: [name] [name] is a 83 y.o. female   With history of ITP, A. fib, DVT on anticoagulation, is sent from nursing home to emergency room due to rapid enlarging leg wound.  Patient was given an fentanyl for leg pain.  She is not able to provide history.  ED course: Initial presentation, does has an tachycardia no fever, Blood pressure stable.  Basic lab work was WBC 13, BUN 42, tension 1.3, lactic acid 2.17.  She is found to have a large hemorrhagic bulla to her left lower extremity which spontaneous ruptured in the ED. Left leg x ray: . Large proximal left lower leg soft tissue ulceration with associated gas, suspicious for a gas-forming organism. 2. No evidence of underlying osteomyelitis. Precept subsequently developed a large amount of bleed from her wound.  General surgery consulted, due to deep bleeding is difficult to control.  She is given 2 units PRBC in the ER, hospitalist called me the patient to stepdown unit.  Review of Systems:  Detail per HPI, Review of systems are otherwise negative  Past Medical History:  Diagnosis Date  . Atrial fibrillation ([place])   . CAD (coronary artery disease)    a. NSTEMI 2/14 => LHC 10/20/12:  dLM 20%, pLAD 99%, mLAD 60% followed by 99%, oD1 99%, and pD2 30%, mild plaque disease in the CFX and RCA. PCI: Xience Xpedition DES to the proximal and mid LAD  . Chronic diastolic CHF (congestive heart failure) ([place])    Echo (08/10/13): Mild LVH, EF 60-65%.  . DJD (degenerative joint disease)   . Dyslipidemia   . HTN (hypertension)   . Osteopenia   . Thrombocytopenia ([place])   . Venous ulcer ([place])    Past Surgical History:  Procedure Laterality Date  . BACK SURGERY     Lumbar disc  . EYE SURGERY     Blepharoplasty  . HIP SURGERY     R THR  .  INSERTION / PLACEMENT / REVISION NEUROSTIMULATOR    . IR FLUORO GUIDE CV LINE RIGHT  06/25/2017  . IR US GUIDE VASC ACCESS RIGHT  06/25/2017  . JOINT REPLACEMENT    . LEFT HEART CATHETERIZATION WITH CORONARY ANGIOGRAM N/A 10/20/2012   Procedure: LEFT HEART CATHETERIZATION WITH CORONARY ANGIOGRAM;  Surgeon: Burnell Blanks, MD;  Location: [facility] CATH LAB;  Service: Cardiovascular;  Laterality: N/A;   Social History:  reports that she has never smoked. She has never used smokeless tobacco. She reports that she does not drink alcohol or use drugs. Patient lives at [facility] & is dependent in ADL's  Allergies  Allergen Reactions  . Keflex [Cephalexin] Other (See Comments)    Reaction:  Unknown     Family History  Problem Relation Age of Onset  . CAD Father 97       Vague history       Prior to Admission medications   Medication Sig Start Date End Date Taking? Authorizing Provider  Calcium Carbonate-Vitamin D (CALCIUM 600+D) 600-200 MG-UNIT TABS Take 2 tablets by mouth daily.   Yes [provider]  dexamethasone (DECADRON) 2 MG tablet Take 10 tablets (20 mg total) by mouth daily. For 4 days. 07/16/17  Yes [place], Holli Humbles, NP  digoxin (LANOXIN) 0.125 MG tablet Take 0.0625 mg by mouth daily.   Yes [provider]  diltiazem (CARDIZEM CD) 240 MG 24 hr capsule Take 1 capsule (240 mg total) by mouth daily. 08/18/13  Yes Velvet Bathe, MD  enoxaparin (LOVENOX) 100 MG/ML injection Inject 0.9 mLs (90 mg total) into the skin 2 (two) times daily. 07/04/17  Yes Patrecia Pour, MD  famciclovir (FAMVIR) 250 MG tablet Take 1 tablet (250 mg total) by mouth daily. 08/03/16  Yes [place], Holli Humbles, NP  famotidine (PEPCID) 20 MG tablet Take 20 mg by mouth 2 (two) times daily.  06/24/17  Yes [provider]  gabapentin (NEURONTIN) 300 MG capsule Take 300 mg by mouth 2 (two) times daily.  05/29/17  Yes [provider]  insulin aspart (NOVOLOG FLEXPEN) 100 UNIT/ML FlexPen Inject  into the skin 3 (three) times daily with meals. SSI: inject as needed for glucose control: BS <120: 0 units BS 121-150: 2 units BS 151-200: 3 units BS 201-250: 5 units BS 251-300: 8 units BS 301-350: 11 units BS 351-400: 15 units   Yes [provider]  LEVEMIR 100 UNIT/ML injection Inject 6 Units into the skin at bedtime.  05/17/17  Yes [provider]  metoprolol (LOPRESSOR) 100 MG tablet Take 1 tablet (100 mg total) by mouth 2 (two) times daily. 08/18/13  Yes Velvet Bathe, MD  Oxycodone HCl 10 MG TABS Take 1 tablet (10 mg total) by mouth every 6 (six) hours as needed (pain). 07/04/17  Yes Patrecia Pour, MD  Polyethyl Glycol-Propyl Glycol (SYSTANE) 0.4-0.3 % SOLN Place 2 drops into both eyes 2 (two) times daily.   Yes [provider]  potassium chloride SA (K-DUR,KLOR-CON) 20 MEQ tablet Take 1 tablet (20 mEq total) by mouth 2 (two) times daily. 08/28/13  Yes [place], Scott T, PA-C  Probiotic Product (RISA-BID PROBIOTIC PO) Take 1 tablet by mouth daily.   Yes [provider]  sitaGLIPtin (JANUVIA) 50 MG tablet Take 50 mg by mouth daily.   Yes [provider]  sodium chloride (OCEAN) 0.65 % SOLN nasal spray Place 1 spray into both nostrils 3 (three) times daily.    Yes [provider]  torsemide (DEMADEX) 20 MG tablet Take 40 mg by mouth daily.  05/29/17  Yes [provider]  traMADol (ULTRAM) 50 MG tablet Take 1 tablet (50 mg total) by mouth 3 (three) times daily. 07/04/17  Yes Patrecia Pour, MD    Physical Exam: BP 111/64   Pulse 89   Temp (!) 97.5 F (36.4 C) (Axillary)   Resp 17   SpO2 99%   General:  Frail, lethargic, but oriented x3, not able to provided detailed history Eyes: PERRL ENT: unremarkable Neck: supple, no JVD Cardiovascular: IRRR Respiratory: CTABL Abdomen: soft/ND/ND, positive bowel sounds Skin: diffuse ecchymosis Musculoskeletal:  Pitting edema lower extremity, left lower leg wrapped with pressure  dressing Psychiatric: calm/cooperative Neurologic: no focal findings            Labs on Admission:  Basic Metabolic Panel:  Recent Labs Lab 07/16/17 0840 07/20/17 1221 07/20/17 1247  NA 142 136 139  K 3.8 4.3 4.2  CL 104 103 102  CO2 30 24  --   GLUCOSE 138* 249* 247*  BUN 19 45* 42*  CREATININE 1.1 1.16* 1.30*  CALCIUM 9.1 8.6*  --    Liver Function Tests:  Recent Labs Lab 07/16/17 0840 07/20/17 1221  AST 13 17  ALT 17 18  ALKPHOS 93* 69  BILITOT 0.90 0.9  PROT 6.5 6.1*  ALBUMIN 2.8* 3.1*   No results  for input(s): LIPASE, AMYLASE in the last 168 hours. No results for input(s): AMMONIA in the last 168 hours. CBC:  Recent Labs Lab 07/16/17 0840 07/20/17 1221 07/20/17 1247  WBC 9.1 13.0*  --   NEUTROABS 7.2* 12.0*  --   HGB 11.8 11.0* 12.2  HCT 38.7 35.1* 36.0  MCV 88 86.0  --   PLT 33 Platelet count consistent in citrate* 160  --    Cardiac Enzymes: No results for input(s): CKTOTAL, CKMB, CKMBINDEX, TROPONINI in the last 168 hours.  BNP (last 3 results)  Recent Labs  11/17/16 1500 01/18/17 1524 06/26/17 1308  BNP 1,492.9* 428.7* 704.4*    ProBNP (last 3 results) No results for input(s): PROBNP in the last 8760 hours.  CBG: No results for input(s): GLUCAP in the last 168 hours.  Radiological Exams on Admission: Dg Tibia/fibula Left  Result Date: 07/20/2017 CLINICAL DATA:  New left lower extremity wound since this morning. The wound has tripled in size since this morning. EXAM: LEFT TIBIA AND FIBULA - 2 VIEW COMPARISON:  08/10/2013. FINDINGS: Large proximal left lower leg soft tissue ulceration with surrounding bandage material. Multiple loculations of associated gas. No periosteal reaction or bone destruction. Diffuse soft tissue swelling. Atheromatous arterial calcifications. Extensive left knee degenerative changes. IMPRESSION: 1. Large proximal left lower leg soft tissue ulceration with associated gas, suspicious for a gas-forming organism.  2. No evidence of underlying osteomyelitis. 3. Marked left knee degenerative changes. Electronically Signed   By: Claudie Revering M.D.   On: 07/20/2017 13:39   Ct Head Wo Contrast  Result Date: 07/20/2017 CLINICAL DATA:  Altered mental status today. EXAM: CT HEAD WITHOUT CONTRAST TECHNIQUE: Contiguous axial images were obtained from the base of the skull through the vertex without intravenous contrast. COMPARISON:  06/26/2017. FINDINGS: Brain: Diffusely enlarged ventricles and subarachnoid spaces. Patchy white matter low density in both cerebral hemispheres. No intracranial hemorrhage, mass lesion or CT evidence of acute infarction. Vascular: No hyperdense vessel or unexpected calcification. Skull: Normal. Negative for fracture or focal lesion. Sinuses/Orbits: Status post bilateral cataract extraction. Unremarkable paranasal sinuses. Other: None. IMPRESSION: 1. No acute abnormality. 2. Stable diffuse cerebral and cerebellar atrophy and chronic small vessel white matter ischemic changes in both cerebral hemispheres. Electronically Signed   By: Claudie Revering M.D.   On: 07/20/2017 14:53   Dg Hip Unilat W Or Wo Pelvis 2-3 Views Left  Result Date: 07/20/2017 CLINICAL DATA:  Large left leg wound that has tripled in size since this morning. EXAM: DG HIP (WITH OR WITHOUT PELVIS) 2-3V LEFT COMPARISON:  Abdomen and pelvis CT dated 12/20/2012. FINDINGS: A right hip bipolar prosthesis is again demonstrated as well as lower lumbar spine laminectomy defects and fixation hardware. Left hip degenerative changes with marked superior joint space narrowing and some subarticular sclerosis and cyst formation, with mild progression. Interval ossification in the region of the iliopsoas tendon attachment. No fracture or dislocation. Atheromatous arterial calcifications. IMPRESSION: 1. Moderate to marked left hip degenerative changes with mild progression. 2. Interval calcification in the region of the distal iliopsoas tendon. 3. No  acute abnormality. Electronically Signed   By: Claudie Revering M.D.   On: 07/20/2017 13:43    EKG: Independently reviewed. *  Assessment/Plan Present on Admission: **None**   Large bleeding wound left lower extremity -Per EDP patient lost several units of blood from the left lower extremity  -Surgery consulted bleeding control, seems slow down after large piece of Gelf[place]am and a tight packing dressing placed -Is getting  2 units of PRBC transfusion, will get platelet transfusion -And empiric antibiotic coverage with vanc and zosyn for now, she does has history of MRSA colonization, has history of diabetes, she Has leukocytosis, lactic acidosis, borderline blood pressure and encephalopathy -hold lovenox   ITP: plt appears stable, continue Decadron   Right upper extremity nonocclusive DVT: Diagnosed from most recent hospitalization from October 10 - October 18 -hold Lovenox in the setting of bleeding   Paroxysmal Afib: CHA2DS2-VASc Score is 4 - hold home meds metoprolol, cardizem due to borderline bp,continue digoxin for rate control - hold  lovenox    chronic diastolic CHF  - will give iv lasix in the setting of getting prbc and plt transfusion. Hold torsemide,close monitor volume status.  AKI on CKD stage III:   - Creatinine slightly elevated from baseline  Insulin dependent diabetes Hold long-acting insulin , due to inconsistent oral intake in the setting of encephalopathy continue SSI  DVT prophylaxis: not able to use lovenox due to bleeding, not able to use scd due to lower extremity wound  Consultants: general surgery oncology  Code Status: DNR  Family Communication:  Patient   Disposition Plan: admit to stepdown  Total Critical Care time in examining the patient bedside, evaluating Lab work and other data, over half of the total time was spent in coordinating patient care on the floor or bedisde in talking to patient/family members, communicating with nursing Staff  on the floor and sub specialists EDP,  general surgery, hematology/oncology to coordinate patients medical care and needs is 70 mins Minutes.  The condition which has caused critical injury/acute impairment of vital organ system with a high probability of sudden clinically significant deterioration and can cause Potential Life threatening injury to this patient addressed today is large volume blood loss , large leg wound, possible infection, clotting disorders    Kiandre Spagnolo MD, PhD [facility] Pager [phone] If 7PM-7AM, please contact night-coverage at [email or web address], password [facility]

## 2017-07-20 NOTE — ED Notes (Signed)
ED Provider at bedside.

## 2017-07-20 NOTE — Progress Notes (Signed)
Time Out 2015-2024  Pt identity verified. Along with correct site and procedure as stated by attending surgeon. All staff involved in agreement.   Jackolyn Confer MD, OR/PACU Nursing

## 2017-07-20 NOTE — ED Notes (Signed)
She is drowsy and is comfortable in appearance. She has just spoken with Dr. Ralene Bathe again, and verbalizes understanding of the gravity of her situation.

## 2017-07-20 NOTE — ED Notes (Signed)
Bed: QA44 Expected date: 07/20/17 Expected time: 11:54 AM Means of arrival: Ambulance Comments: Leg wound ?DVT

## 2017-07-20 NOTE — ED Notes (Signed)
Patient transported to CT

## 2017-07-21 DIAGNOSIS — I4891 Unspecified atrial fibrillation: Secondary | ICD-10-CM

## 2017-07-21 DIAGNOSIS — N183 Chronic kidney disease, stage 3 (moderate): Secondary | ICD-10-CM

## 2017-07-21 DIAGNOSIS — D693 Immune thrombocytopenic purpura: Secondary | ICD-10-CM

## 2017-07-21 DIAGNOSIS — D696 Thrombocytopenia, unspecified: Secondary | ICD-10-CM

## 2017-07-21 DIAGNOSIS — Z7901 Long term (current) use of anticoagulants: Secondary | ICD-10-CM

## 2017-07-21 LAB — CBC WITH DIFFERENTIAL/PLATELET
BASOS ABS: 0 10*3/uL (ref 0.0–0.1)
Basophils Relative: 0 %
EOS ABS: 0 10*3/uL (ref 0.0–0.7)
Eosinophils Relative: 0 %
HCT: 25 % — ABNORMAL LOW (ref 36.0–46.0)
HEMOGLOBIN: 8 g/dL — AB (ref 12.0–15.0)
Lymphocytes Relative: 5 %
Lymphs Abs: 0.7 10*3/uL (ref 0.7–4.0)
MCH: 27.3 pg (ref 26.0–34.0)
MCHC: 32 g/dL (ref 30.0–36.0)
MCV: 85.3 fL (ref 78.0–100.0)
Monocytes Absolute: 1 10*3/uL (ref 0.1–1.0)
Monocytes Relative: 7 %
NEUTROS PCT: 88 %
Neutro Abs: 12.9 10*3/uL — ABNORMAL HIGH (ref 1.7–7.7)
Platelets: 82 10*3/uL — ABNORMAL LOW (ref 150–400)
RBC: 2.93 MIL/uL — AB (ref 3.87–5.11)
RDW: 17.4 % — ABNORMAL HIGH (ref 11.5–15.5)
WBC: 14.7 10*3/uL — AB (ref 4.0–10.5)

## 2017-07-21 LAB — URINALYSIS, ROUTINE W REFLEX MICROSCOPIC
Bilirubin Urine: NEGATIVE
GLUCOSE, UA: NEGATIVE mg/dL
Hgb urine dipstick: NEGATIVE
Ketones, ur: NEGATIVE mg/dL
NITRITE: NEGATIVE
PROTEIN: NEGATIVE mg/dL
Specific Gravity, Urine: 1.013 (ref 1.005–1.030)
pH: 5 (ref 5.0–8.0)

## 2017-07-21 LAB — BASIC METABOLIC PANEL
ANION GAP: 9 (ref 5–15)
BUN: 49 mg/dL — ABNORMAL HIGH (ref 6–20)
CHLORIDE: 107 mmol/L (ref 101–111)
CO2: 25 mmol/L (ref 22–32)
CREATININE: 1.2 mg/dL — AB (ref 0.44–1.00)
Calcium: 8 mg/dL — ABNORMAL LOW (ref 8.9–10.3)
GFR calc non Af Amer: 40 mL/min — ABNORMAL LOW (ref >60)
GFR, EST AFRICAN AMERICAN: 47 mL/min — AB (ref >60)
Glucose, Bld: 186 mg/dL — ABNORMAL HIGH (ref 65–99)
POTASSIUM: 3.9 mmol/L (ref 3.5–5.1)
SODIUM: 141 mmol/L (ref 135–145)

## 2017-07-21 LAB — GLUCOSE, CAPILLARY
GLUCOSE-CAPILLARY: 262 mg/dL — AB (ref 65–99)
Glucose-Capillary: 355 mg/dL — ABNORMAL HIGH (ref 65–99)

## 2017-07-21 LAB — LACTIC ACID, PLASMA: LACTIC ACID, VENOUS: 2.7 mmol/L — AB (ref 0.5–1.9)

## 2017-07-21 LAB — PREPARE RBC (CROSSMATCH)

## 2017-07-21 LAB — DIGOXIN LEVEL: DIGOXIN LVL: 0.8 ng/mL (ref 0.8–2.0)

## 2017-07-21 MED ORDER — FUROSEMIDE 10 MG/ML IJ SOLN
20.0000 mg | Freq: Once | INTRAMUSCULAR | Status: AC
  Administered 2017-07-21: 20 mg via INTRAVENOUS
  Filled 2017-07-21: qty 2

## 2017-07-21 MED ORDER — SODIUM CHLORIDE 0.9 % IV SOLN
40.0000 mg | INTRAVENOUS | Status: AC
  Administered 2017-07-21 – 2017-07-24 (×4): 40 mg via INTRAVENOUS
  Filled 2017-07-21 (×4): qty 4

## 2017-07-21 MED ORDER — CHLORHEXIDINE GLUCONATE CLOTH 2 % EX PADS
6.0000 | MEDICATED_PAD | Freq: Every day | CUTANEOUS | Status: AC
  Administered 2017-07-21 – 2017-07-25 (×5): 6 via TOPICAL

## 2017-07-21 MED ORDER — MUPIROCIN 2 % EX OINT
1.0000 "application " | TOPICAL_OINTMENT | Freq: Two times a day (BID) | CUTANEOUS | Status: AC
  Administered 2017-07-21 – 2017-07-25 (×10): 1 via NASAL
  Filled 2017-07-21 (×3): qty 22

## 2017-07-21 MED ORDER — SODIUM CHLORIDE 0.9 % IV SOLN
Freq: Once | INTRAVENOUS | Status: AC
  Administered 2017-07-21: 17:00:00 via INTRAVENOUS

## 2017-07-21 MED ORDER — INSULIN ASPART 100 UNIT/ML ~~LOC~~ SOLN
0.0000 [IU] | Freq: Three times a day (TID) | SUBCUTANEOUS | Status: DC
  Administered 2017-07-21: 9 [IU] via SUBCUTANEOUS
  Administered 2017-07-22: 7 [IU] via SUBCUTANEOUS
  Administered 2017-07-22: 3 [IU] via SUBCUTANEOUS
  Administered 2017-07-22: 5 [IU] via SUBCUTANEOUS
  Administered 2017-07-23: 1 [IU] via SUBCUTANEOUS
  Administered 2017-07-23: 2 [IU] via SUBCUTANEOUS
  Administered 2017-07-23 – 2017-07-24 (×2): 5 [IU] via SUBCUTANEOUS
  Administered 2017-07-24: 3 [IU] via SUBCUTANEOUS
  Administered 2017-07-24: 5 [IU] via SUBCUTANEOUS
  Administered 2017-07-25: 9 [IU] via SUBCUTANEOUS
  Administered 2017-07-25: 7 [IU] via SUBCUTANEOUS
  Administered 2017-07-25: 3 [IU] via SUBCUTANEOUS
  Administered 2017-07-26: 1 [IU] via SUBCUTANEOUS

## 2017-07-21 MED ORDER — DEXAMETHASONE SODIUM PHOSPHATE 4 MG/ML IJ SOLN: 40.0000 mg | INTRAMUSCULAR | Status: DC

## 2017-07-21 MED ORDER — METOPROLOL TARTRATE 25 MG PO TABS
50.0000 mg | ORAL_TABLET | Freq: Two times a day (BID) | ORAL | Status: DC
  Administered 2017-07-21 (×2): 50 mg via ORAL
  Filled 2017-07-21 (×2): qty 2

## 2017-07-21 MED ORDER — MORPHINE SULFATE (PF) 4 MG/ML IV SOLN
2.0000 mg | Freq: Four times a day (QID) | INTRAVENOUS | Status: DC | PRN
  Administered 2017-07-21 – 2017-07-26 (×9): 2 mg via INTRAVENOUS
  Filled 2017-07-21 (×9): qty 1

## 2017-07-21 MED ORDER — INSULIN ASPART 100 UNIT/ML ~~LOC~~ SOLN
0.0000 [IU] | Freq: Every day | SUBCUTANEOUS | Status: DC
  Administered 2017-07-21: 3 [IU] via SUBCUTANEOUS
  Administered 2017-07-22: 5 [IU] via SUBCUTANEOUS
  Administered 2017-07-23: 2 [IU] via SUBCUTANEOUS
  Administered 2017-07-24: 4 [IU] via SUBCUTANEOUS
  Administered 2017-07-25 – 2017-07-26 (×2): 2 [IU] via SUBCUTANEOUS

## 2017-07-21 NOTE — Consult Note (Signed)
Referral MD  Reason for Referral: Coagulopathy with bleeding into the left leg.  History of ITP.  On Lovenox.  Chief Complaint  Patient presents with  . Leg Pain  : I bled into my left leg.  HPI: [name] [name] is well-known to me.  She is an 83 year old white female.  I have seen her for many years.  We are following her for immune thrombocytopenia.  This was based on a bone marrow biopsy that she recently had.  She has been getting IV steroids.  She is been getting IVIG.  We just saw her in the office late last week.  Everything looked okay.  She got a dose of Decadron in the office.  Decadron always helps with her platelet count.  She apparently had a huge hemorrhagic bulla in the left leg.  This ruptured and bled.  She was on Lovenox.  This is for a upper extremity DVT and atrial fibrillation.  She had a platelet count of 160,000.  Her PT and PTT were minimally elevated.  Her hemoglobin has dropped.  She is gotten 2 units of blood, 2 units of FFP and platelets.  I am not sure why she got platelets.  Today, her platelet count is down to 82,000.  We will get her back on Decadron.  She has multiple other health problems.  Her overall performance status is quite poor at ECOG 3-4.  The goal of care for [name] [name] clearly is quality of life.  She has quite a few ecchymoses throughout her body.  She has had no bleeding elsewhere.  She is trying to develop a small bulla on the right lower leg.     Past Medical History:  Diagnosis Date  . Atrial fibrillation ([place])   . CAD (coronary artery disease)    a. NSTEMI 2/14 => LHC 10/20/12:  dLM 20%, pLAD 99%, mLAD 60% followed by 99%, oD1 99%, and pD2 30%, mild plaque disease in the CFX and RCA. PCI: Xience Xpedition DES to the proximal and mid LAD  . Chronic diastolic CHF (congestive heart failure) ([place])    Echo (08/10/13): Mild LVH, EF 60-65%.  . DJD (degenerative joint disease)   . Dyslipidemia   . HTN (hypertension)   . Osteopenia   .  Thrombocytopenia ([place])   . Venous ulcer ([place])   :  Past Surgical History:  Procedure Laterality Date  . BACK SURGERY     Lumbar disc  . EYE SURGERY     Blepharoplasty  . HIP SURGERY     R THR  . INSERTION / PLACEMENT / REVISION NEUROSTIMULATOR    . IR FLUORO GUIDE CV LINE RIGHT  06/25/2017  . IR US GUIDE VASC ACCESS RIGHT  06/25/2017  . JOINT REPLACEMENT    :   Current Facility-Administered Medications:  .  0.9 %  sodium chloride infusion, , Intravenous, Once, Quintella Reichert, MD .  0.9 %  sodium chloride infusion, , Intravenous, Once, Florencia Reasons, MD .  Chlorhexidine Gluconate Cloth 2 % PADS 6 each, 6 each, Topical, Q0600, Barton Dubois, MD, 6 each at 07/21/17 0551 .  digoxin (LANOXIN) tablet 0.0625 mg, 0.0625 mg, Oral, Daily, Florencia Reasons, MD .  furosemide (LASIX) injection 40 mg, 40 mg, Intravenous, Daily, Florencia Reasons, MD, Stopped at 07/20/17 2000 .  mupirocin ointment (BACTROBAN) 2 % 1 application, 1 application, Nasal, BID, Barton Dubois, MD .  oxyCODONE (Oxy IR/ROXICODONE) immediate release tablet 10 mg, 10 mg, Oral, Q6H PRN, Florencia Reasons, MD, 10 mg  at 07/21/17 0149 .  piperacillin-tazobactam (ZOSYN) IVPB 3.375 g, 3.375 g, Intravenous, Q8H, Luiz Ochoa, RPH, Last Rate: 12.5 mL/hr at 07/21/17 0454, 3.375 g at 07/21/17 0454 .  polyvinyl alcohol (LIQUIFILM TEARS) 1.4 % ophthalmic solution 2 drop, 2 drop, Both Eyes, BID, Florencia Reasons, MD, 2 drop at 07/20/17 2138 .  sodium chloride (OCEAN) 0.65 % nasal spray 1 spray, 1 spray, Each Nare, TID, Florencia Reasons, MD, 1 spray at 07/20/17 2138 .  traMADol (ULTRAM) tablet 50 mg, 50 mg, Oral, TID, Florencia Reasons, MD, 50 mg at 07/20/17 2243 .  [START ON 07/22/2017] vancomycin (VANCOCIN) 1,250 mg in sodium chloride 0.9 % 250 mL IVPB, 1,250 mg, Intravenous, Q48H, Luiz Ochoa, RPH:  . Chlorhexidine Gluconate Cloth  6 each Topical Q0600  . digoxin  0.0625 mg Oral Daily  . furosemide  40 mg Intravenous Daily  . mupirocin ointment  1 application Nasal BID  . polyvinyl  alcohol  2 drop Both Eyes BID  . sodium chloride  1 spray Each Nare TID  . traMADol  50 mg Oral TID  :  Allergies  Allergen Reactions  . Keflex [Cephalexin] Other (See Comments)    Reaction:  Unknown   :  Family History  Problem Relation Age of Onset  . CAD Father 81       Vague history   :  Social History   Socioeconomic History  . Marital status: Divorced    Spouse name: Not on file  . Number of children: 2  . Years of education: Not on file  . Highest education level: Not on file  Social Needs  . Financial resource strain: Not on file  . Food insecurity - worry: Not on file  . Food insecurity - inability: Not on file  . Transportation needs - medical: Not on file  . Transportation needs - non-medical: Not on file  Occupational History    Employer: RETIRED  Tobacco Use  . Smoking status: Never Smoker  . Smokeless tobacco: Never Used  Substance and Sexual Activity  . Alcohol use: No  . Drug use: No  . Sexual activity: Not on file  Other Topics Concern  . Not on file  Social History Narrative   Lives at [place].  Two adopted children. Divorced.    :  Pertinent items are noted in HPI.  Exam: Patient Vitals for the past 24 hrs:  BP Temp Temp src Pulse Resp SpO2 Height Weight  07/21/17 0700 (!) 132/59 (!) 97.3 F (36.3 C) Oral 100 16 96 % - -  07/21/17 0600 (!) 121/35 - - 87 15 97 % - -  07/21/17 0430 (!) 123/50 (!) 97.4 F (36.3 C) Oral 90 19 95 % - -  07/21/17 0351 - 98.2 F (36.8 C) Axillary - - - - -  07/21/17 0216 126/87 97.6 F (36.4 C) Oral 75 18 97 % - -  07/21/17 0130 (!) 110/43 (!) 97.4 F (36.3 C) Oral 93 17 99 % - -  07/21/17 0116 (!) 109/45 (!) 97.4 F (36.3 C) Oral 87 12 97 % - -  07/21/17 0000 (!) 107/52 - - 78 (!) 21 98 % - -  07/20/17 2342 - (!) 97.1 F (36.2 C) Axillary - - - - -  07/20/17 2320 115/73 97.6 F (36.4 C) Oral 88 18 94 % - -  07/20/17 2145 (!) 120/52 (!) 97.4 F (36.3 C) Oral 94 20 95 % - -  07/20/17 2115 (!) 104/39  Marland Kitchen)  97.5 F (36.4 C) Oral 98 10 94 % - -  07/20/17 2031 - (!) 97 F (36.1 C) Axillary - - - - -  07/20/17 2000 - - - - - - _0  (1.626 m) 185 lb 13.6 oz (84.3 kg)  07/20/17 1900 (!) 111/39 - - 98 18 92 % - -  07/20/17 1844 - (!) 96.3 F (35.7 C) Axillary - - - _1  (1.626 m) 185 lb 13.6 oz (84.3 kg)  07/20/17 1804 113/61 97.6 F (36.4 C) Axillary - - - - -  07/20/17 1700 111/64 - - 89 17 99 % - -  07/20/17 1644 111/64 (!) 97.5 F (36.4 C) Axillary - - - - -  07/20/17 1628 - 97.9 F (36.6 C) Axillary - - - - -  07/20/17 1619 (!) 115/56 97.6 F (36.4 C) Axillary 87 12 100 % - -  07/20/17 1619 - 97.9 F (36.6 C) Axillary - - - - -  07/20/17 1536 (!) 129/59 - - 89 12 100 % - -  07/20/17 1430 131/72 - - 99 14 100 % - -  07/20/17 1400 (!) 141/67 - - 78 13 100 % - -  07/20/17 1336 127/80 - - 72 12 100 % - -  07/20/17 1300 (!) 156/86 - - 75 16 98 % - -  07/20/17 1219 (!) 152/117 97.6 F (36.4 C) Axillary (!) 102 - 97 % - -     Recent Labs    07/20/17 1221 07/20/17 1247 07/21/17 0331  WBC 13.0*  --  14.7*  HGB 11.0* 12.2 8.0*  HCT 35.1* 36.0 25.0*  PLT 160  --  82*   Recent Labs    07/20/17 1221 07/20/17 1247 07/21/17 0331  NA 136 139 141  K 4.3 4.2 3.9  CL 103 102 107  CO2 24  --  25  GLUCOSE 249* 247* 186*  BUN 45* 42* 49*  CREATININE 1.16* 1.30* 1.20*  CALCIUM 8.6*  --  8.0*    Blood smear review: None  Pathology: None    Assessment and Plan: [name] [name] is a 83 year old white female.  My aspect of care for her is the immune thrombocytopenia.  She recently had a bone marrow test done.  This did not show any evidence of a hematologic malignancy.  It was consistent with her immune thrombocytopenia.  I clearly will get her off Lovenox.  I cannot imagine that she has developed a coagulopathy now.  Her platelet count was fine when she came in.  I will get her back on Decadron which works very well for her.  I am not sure why she would have such a reaction and  have this bleeding with Lovenox.  I realize that she has atrial fibrillation.  However, anticoagulation for her is contraindicated at this point.  I am sure it will take quite a while for this leg wound to heal up.  Being on steroids will not help this.  However, not much really helps with her thrombocytopenia.  She is always so nice.  She is very thankful for the wonderful care that she is getting by all the staff in the ICU.  Lattie Haw, MD  Romans 5:3-5

## 2017-07-21 NOTE — Progress Notes (Signed)
CRITICAL VALUE ALERT  Critical Value: Hgb 6.7  Date & Time Notied:  07/21/2017 1528  Provider Notified: Dyann Kief, MD  Orders Received/Actions taken:

## 2017-07-21 NOTE — Progress Notes (Signed)
Notified other emergency contact to obtain son's phone number. Son's number is now updated in chart. Phone number did ring but no answer.

## 2017-07-21 NOTE — Progress Notes (Signed)
Patient ID: [name] [name], female   DOB: 1931/06/09, 83 y.o.   MRN: [id number]     Subjective: Tearful.  Dressing stayed dry overnight and was just reinforced about an hour ago.  Currently dry.  Objective: Vital signs in last 24 hours: Temp:  [96.3 F (35.7 C)-98.2 F (36.8 C)] 97.4 F (36.3 C) (11/04 0430) Pulse Rate:  [72-102] 87 (11/04 0600) Resp:  [10-21] 15 (11/04 0600) BP: (104-156)/(35-117) 121/35 (11/04 0600) SpO2:  [92 %-100 %] 97 % (11/04 0600) Weight:  [84.3 kg (185 lb 13.6 oz)] 84.3 kg (185 lb 13.6 oz) (11/03 2000)    Intake/Output from previous day: 11/03 0701 - 11/04 0700 In: 2195 [I.V.:500; Blood:1195; IV Piggyback:500] Out: -  Intake/Output this shift: No intake/output data recorded.  General appearance: alert, mild distress and Tearful  Left lower extremity Ace wrap in place without bleeding through the dressing at present  Lab Results:  Recent Labs    07/20/17 1221 07/20/17 1247 07/21/17 0331  WBC 13.0*  --  14.7*  HGB 11.0* 12.2 8.0*  HCT 35.1* 36.0 25.0*  PLT 160  --  82*   BMET Recent Labs    07/20/17 1221 07/20/17 1247 07/21/17 0331  NA 136 139 141  K 4.3 4.2 3.9  CL 103 102 107  CO2 24  --  25  GLUCOSE 249* 247* 186*  BUN 45* 42* 49*  CREATININE 1.16* 1.30* 1.20*  CALCIUM 8.6*  --  8.0*     Studies/Results: Dg Tibia/fibula Left  Result Date: 07/20/2017 CLINICAL DATA:  New left lower extremity wound since this morning. The wound has tripled in size since this morning. EXAM: LEFT TIBIA AND FIBULA - 2 VIEW COMPARISON:  08/10/2013. FINDINGS: Large proximal left lower leg soft tissue ulceration with surrounding bandage material. Multiple loculations of associated gas. No periosteal reaction or bone destruction. Diffuse soft tissue swelling. Atheromatous arterial calcifications. Extensive left knee degenerative changes. IMPRESSION: 1. Large proximal left lower leg soft tissue ulceration with associated gas, suspicious for a gas-forming  organism. 2. No evidence of underlying osteomyelitis. 3. Marked left knee degenerative changes. Electronically Signed   By: Claudie Revering M.D.   On: 07/20/2017 13:39   Ct Head Wo Contrast  Result Date: 07/20/2017 CLINICAL DATA:  Altered mental status today. EXAM: CT HEAD WITHOUT CONTRAST TECHNIQUE: Contiguous axial images were obtained from the base of the skull through the vertex without intravenous contrast. COMPARISON:  06/26/2017. FINDINGS: Brain: Diffusely enlarged ventricles and subarachnoid spaces. Patchy white matter low density in both cerebral hemispheres. No intracranial hemorrhage, mass lesion or CT evidence of acute infarction. Vascular: No hyperdense vessel or unexpected calcification. Skull: Normal. Negative for fracture or focal lesion. Sinuses/Orbits: Status post bilateral cataract extraction. Unremarkable paranasal sinuses. Other: None. IMPRESSION: 1. No acute abnormality. 2. Stable diffuse cerebral and cerebellar atrophy and chronic small vessel white matter ischemic changes in both cerebral hemispheres. Electronically Signed   By: Claudie Revering M.D.   On: 07/20/2017 14:53   Dg Hip Unilat W Or Wo Pelvis 2-3 Views Left  Result Date: 07/20/2017 CLINICAL DATA:  Large left leg wound that has tripled in size since this morning. EXAM: DG HIP (WITH OR WITHOUT PELVIS) 2-3V LEFT COMPARISON:  Abdomen and pelvis CT dated 12/20/2012. FINDINGS: A right hip bipolar prosthesis is again demonstrated as well as lower lumbar spine laminectomy defects and fixation hardware. Left hip degenerative changes with marked superior joint space narrowing and some subarticular sclerosis and cyst formation, with mild progression. Interval  ossification in the region of the iliopsoas tendon attachment. No fracture or dislocation. Atheromatous arterial calcifications. IMPRESSION: 1. Moderate to marked left hip degenerative changes with mild progression. 2. Interval calcification in the region of the distal iliopsoas tendon.  3. No acute abnormality. Electronically Signed   By: Claudie Revering M.D.   On: 07/20/2017 13:43    Anti-infectives: Anti-infectives (From admission, onward)   Start     Dose/Rate Route Frequency Ordered Stop   07/22/17 2000  vancomycin (VANCOCIN) 1,250 mg in sodium chloride 0.9 % 250 mL IVPB     1,250 mg 166.7 mL/hr over 90 Minutes Intravenous Every 48 hours 07/20/17 2132     07/21/17 0400  piperacillin-tazobactam (ZOSYN) IVPB 3.375 g     3.375 g 12.5 mL/hr over 240 Minutes Intravenous Every 8 hours 07/20/17 2114     07/20/17 2000  piperacillin-tazobactam (ZOSYN) IVPB 3.375 g     3.375 g 100 mL/hr over 30 Minutes Intravenous STAT 07/20/17 1937 07/20/17 2137   07/20/17 2000  vancomycin (VANCOCIN) 1,750 mg in sodium chloride 0.9 % 500 mL IVPB     1,750 mg 250 mL/hr over 120 Minutes Intravenous STAT 07/20/17 1939 07/20/17 2307      Assessment/Plan: Patient with multiple medical problems and anticoagulated on Lovenox for upper extremity DVT, history of ITP, large superficial degloving wound of left upper extremity with diffuse oozing and no evidence of surgical bleeding.  Seems reasonably controlled currently with pressure dressing.  She has received PRBCs and plasma and platelet transfusion pending.  Continue current management for now.  X-ray above describes gas of concern for gas-forming organism but exam of the wound last night did not indicate any active cellulitis or infection.  I did not take down the dressing this morning to avoid causing further bleeding.    LOS: 1 day    [name] [name] 07/21/2017

## 2017-07-21 NOTE — Progress Notes (Signed)
[facility] PROGRESS NOTE  [name] [name] GNO:[id number] DOB: [birth year] DOA: 07/20/2017 PCP: Lajean Manes, MD  Interim summary and HPI 83 y.o. female With history of ITP, A. fib, DVT on anticoagulation, is sent from nursing home to emergency room due to rapid enlarging leg wound. Patient with active bleeding and rapid hemodynamically compromising.  Assessment/Plan: 1-LLE with large bleeding wound -with concerns for reaction from lovenox -still bleeding from LLE wound -will continue prophylactic abx; continue transfusion as needed  -per oncology service will start decadron -follow CBC  2-ITP -continue decadron -follow rec's from oncology -platelet transfusion and FFP as needed   3-right upper extremity non-occlusive DVT and hx of A. Fib -CHADsVASC 3 -was on chronic lovenox -stopped now with active bleeding   4-chronic paroxysmal A. Fib -will continue digoxin and resume lopressor -follow on telemetry -no anticoagulation in setting of active bleeding   5-chronic diastolic HF -compensated -will follow daily weight and strict intake and output -continue lasix (as BP tolerates) given ongoing transfusion and IVF's given with abx's  6-AKI on CKD: stage 3 at baseline -just mild worsening on her Cr -will monitor trend -minimizing nephrotoxic agents  7-insulin dependent diabetes with renal dysfunction -will continue SSI  Code Status: DNR Family Communication: no family at bedside  Disposition Plan: remains in stepdown; continue supportive care, blood transfusion, FFP and platelets transfusion as needed. Start decadron and follow response.   Consultants:  [facility]  Oncology   Procedures:  See below for x-ray reports   Antibiotics:  vanc and zosyn   HPI/Subjective: Afebrile, no CP or SOB> no nausea or vomiting. Still with bleeding out of her LLE wound. AAOX3. Reports significant pain in her leg and lower back.  Objective: Vitals:   07/21/17 1130 07/21/17  1200  BP: 121/60 (!) 122/48  Pulse: (!) 112 (!) 118  Resp: 17 12  Temp:  97.9 F (36.6 C)  SpO2: 96% 97%    Intake/Output Summary (Last 24 hours) at 07/21/2017 1402 Last data filed at 07/21/2017 1158 Gross per 24 hour  Intake 2768 ml  Output 375 ml  Net 2393 ml   Filed Weights   07/20/17 1844 07/20/17 2000  Weight: 84.3 kg (185 lb 13.6 oz) 84.3 kg (185 lb 13.6 oz)    Exam:   General:  Afebrile, no CP, no SOB. Patient complaining of significant pain in her LLE and also in lower back. No nausea, no vomiting   Cardiovascular: irregular, no rubs, no gallops, no JVD  Respiratory: good air movement, no wheezing, no crackles  Abdomen: soft, NT, ND, positive BS  Musculoskeletal: LLE with compressive dressing in place; no cyanosis, no clubbing; RLE with some bruises and small non rupture bullae.  Skin: multiple bruises, redness and petechiae affecting lower back, upper and lower extremities. LLE with biggest wound, currently with active ongoing bleeding and compressive dressing in place.  Data Reviewed: Basic Metabolic Panel: Recent Labs  Lab 07/16/17 0840 07/20/17 1221 07/20/17 1247 07/21/17 0331  NA 142 136 139 141  K 3.8 4.3 4.2 3.9  CL 104 103 102 107  CO2 30 24  --  25  GLUCOSE 138* 249* 247* 186*  BUN 19 45* 42* 49*  CREATININE 1.1 1.16* 1.30* 1.20*  CALCIUM 9.1 8.6*  --  8.0*   Liver Function Tests: Recent Labs  Lab 07/16/17 0840 07/20/17 1221  AST 13 17  ALT 17 18  ALKPHOS 93* 69  BILITOT 0.90 0.9  PROT 6.5 6.1*  ALBUMIN 2.8* 3.1*  CBC: Recent Labs  Lab 07/16/17 0840 07/20/17 1221 07/20/17 1247 07/21/17 0331  WBC 9.1 13.0*  --  14.7*  NEUTROABS 7.2* 12.0*  --  12.9*  HGB 11.8 11.0* 12.2 8.0*  HCT 38.7 35.1* 36.0 25.0*  MCV 88 86.0  --  85.3  PLT 33 Platelet count consistent in citrate* 160  --  82*   BNP (last 3 results) Recent Labs    11/17/16 1500 01/18/17 1524 06/26/17 1308  BNP 1,492.9* 428.7* 704.4*    CBG: No results for  input(s): GLUCAP in the last 168 hours.  Recent Results (from the past 240 hour(s))  MRSA PCR Screening     Status: Abnormal   Collection Time: 07/20/17  8:00 PM  Result Value Ref Range Status   MRSA by PCR POSITIVE (A) NEGATIVE Final    Comment:        The GeneXpert MRSA Assay (FDA approved for NASAL specimens only), is one component of a comprehensive MRSA colonization surveillance program. It is not intended to diagnose MRSA infection nor to guide or monitor treatment for MRSA infections. RESULT CALLED TO, READ BACK BY AND VERIFIED WITH: R MACINTOSH,RN _0  07/20/17 MKELLY      Studies: Dg Tibia/fibula Left  Result Date: 07/20/2017 CLINICAL DATA:  New left lower extremity wound since this morning. The wound has tripled in size since this morning. EXAM: LEFT TIBIA AND FIBULA - 2 VIEW COMPARISON:  08/10/2013. FINDINGS: Large proximal left lower leg soft tissue ulceration with surrounding bandage material. Multiple loculations of associated gas. No periosteal reaction or bone destruction. Diffuse soft tissue swelling. Atheromatous arterial calcifications. Extensive left knee degenerative changes. IMPRESSION: 1. Large proximal left lower leg soft tissue ulceration with associated gas, suspicious for a gas-forming organism. 2. No evidence of underlying osteomyelitis. 3. Marked left knee degenerative changes. Electronically Signed   By: Claudie Revering M.D.   On: 07/20/2017 13:39   Ct Head Wo Contrast  Result Date: 07/20/2017 CLINICAL DATA:  Altered mental status today. EXAM: CT HEAD WITHOUT CONTRAST TECHNIQUE: Contiguous axial images were obtained from the base of the skull through the vertex without intravenous contrast. COMPARISON:  06/26/2017. FINDINGS: Brain: Diffusely enlarged ventricles and subarachnoid spaces. Patchy white matter low density in both cerebral hemispheres. No intracranial hemorrhage, mass lesion or CT evidence of acute infarction. Vascular: No hyperdense vessel or  unexpected calcification. Skull: Normal. Negative for fracture or focal lesion. Sinuses/Orbits: Status post bilateral cataract extraction. Unremarkable paranasal sinuses. Other: None. IMPRESSION: 1. No acute abnormality. 2. Stable diffuse cerebral and cerebellar atrophy and chronic small vessel white matter ischemic changes in both cerebral hemispheres. Electronically Signed   By: Claudie Revering M.D.   On: 07/20/2017 14:53   Dg Hip Unilat W Or Wo Pelvis 2-3 Views Left  Result Date: 07/20/2017 CLINICAL DATA:  Large left leg wound that has tripled in size since this morning. EXAM: DG HIP (WITH OR WITHOUT PELVIS) 2-3V LEFT COMPARISON:  Abdomen and pelvis CT dated 12/20/2012. FINDINGS: A right hip bipolar prosthesis is again demonstrated as well as lower lumbar spine laminectomy defects and fixation hardware. Left hip degenerative changes with marked superior joint space narrowing and some subarticular sclerosis and cyst formation, with mild progression. Interval ossification in the region of the iliopsoas tendon attachment. No fracture or dislocation. Atheromatous arterial calcifications. IMPRESSION: 1. Moderate to marked left hip degenerative changes with mild progression. 2. Interval calcification in the region of the distal iliopsoas tendon. 3. No acute abnormality. Electronically Signed   By: Remo Lipps  Joneen Caraway M.D.   On: 07/20/2017 13:43    Scheduled Meds: . Chlorhexidine Gluconate Cloth  6 each Topical Q0600  . digoxin  0.0625 mg Oral Daily  . furosemide  40 mg Intravenous Daily  . metoprolol tartrate  50 mg Oral BID  . mupirocin ointment  1 application Nasal BID  . polyvinyl alcohol  2 drop Both Eyes BID  . sodium chloride  1 spray Each Nare TID  . traMADol  50 mg Oral TID   Continuous Infusions: . sodium chloride    . sodium chloride    . dexamethasone (DECADRON) IVPB [facility] Stopped (07/21/17 0934)  . piperacillin-tazobactam (ZOSYN)  IV 3.375 g (07/21/17 1158)  . [START ON 07/22/2017] vancomycin       Active Problems:   Sepsis ([place])   Bleeding from wound    Time spent: 35 minutes    Barton Dubois  [facility] Pager [phone]. If 7PM-7AM, please contact night-coverage at [email or web address], password [facility] 07/21/2017, 2:02 PM  LOS: 1 day

## 2017-07-21 NOTE — Progress Notes (Signed)
[place] Lattie Haw) called in regards to patient. Asked for family contact. She gave a son's Margrett Rud) number [phone] but it is not in service. Nursing Supervisor phone number is [phone].

## 2017-07-22 ENCOUNTER — Other Ambulatory Visit: Payer: Self-pay

## 2017-07-22 DIAGNOSIS — D62 Acute posthemorrhagic anemia: Secondary | ICD-10-CM

## 2017-07-22 DIAGNOSIS — I482 Chronic atrial fibrillation: Secondary | ICD-10-CM

## 2017-07-22 LAB — CBC WITH DIFFERENTIAL/PLATELET
BASOS ABS: 0 10*3/uL (ref 0.0–0.1)
BASOS ABS: 0 10*3/uL (ref 0.0–0.1)
BASOS PCT: 0 %
Basophils Relative: 0 %
EOS PCT: 0 %
Eosinophils Absolute: 0 10*3/uL (ref 0.0–0.7)
Eosinophils Absolute: 0 10*3/uL (ref 0.0–0.7)
Eosinophils Relative: 0 %
HEMATOCRIT: 20.3 % — AB (ref 36.0–46.0)
HEMATOCRIT: 27.5 % — AB (ref 36.0–46.0)
HEMOGLOBIN: 6.7 g/dL — AB (ref 12.0–15.0)
Hemoglobin: 9.3 g/dL — ABNORMAL LOW (ref 12.0–15.0)
LYMPHS ABS: 0.6 10*3/uL — AB (ref 0.7–4.0)
LYMPHS PCT: 7 %
Lymphocytes Relative: 2 %
Lymphs Abs: 0.3 10*3/uL — ABNORMAL LOW (ref 0.7–4.0)
MCH: 28.6 pg (ref 26.0–34.0)
MCH: 29.5 pg (ref 26.0–34.0)
MCHC: 33 g/dL (ref 30.0–36.0)
MCHC: 33.8 g/dL (ref 30.0–36.0)
MCV: 86.8 fL (ref 78.0–100.0)
MCV: 87.3 fL (ref 78.0–100.0)
MONO ABS: 0.4 10*3/uL (ref 0.1–1.0)
MONOS PCT: 1 %
Monocytes Absolute: 0.2 10*3/uL (ref 0.1–1.0)
Monocytes Relative: 5 %
NEUTROS ABS: 20.6 10*3/uL — AB (ref 1.7–7.7)
NEUTROS ABS: 7.7 10*3/uL (ref 1.7–7.7)
NEUTROS PCT: 98 %
Neutrophils Relative %: 88 %
PLATELETS: 164 10*3/uL (ref 150–400)
Platelets: 195 10*3/uL (ref 150–400)
RBC: 2.34 MIL/uL — ABNORMAL LOW (ref 3.87–5.11)
RBC: 3.15 MIL/uL — ABNORMAL LOW (ref 3.87–5.11)
RDW: 18.2 % — ABNORMAL HIGH (ref 11.5–15.5)
RDW: 16.5 % — AB (ref 11.5–15.5)
WBC: 21.1 10*3/uL — ABNORMAL HIGH (ref 4.0–10.5)
WBC: 8.8 10*3/uL (ref 4.0–10.5)

## 2017-07-22 LAB — GLUCOSE, CAPILLARY
GLUCOSE-CAPILLARY: 364 mg/dL — AB (ref 65–99)
Glucose-Capillary: 201 mg/dL — ABNORMAL HIGH (ref 65–99)
Glucose-Capillary: 262 mg/dL — ABNORMAL HIGH (ref 65–99)
Glucose-Capillary: 348 mg/dL — ABNORMAL HIGH (ref 65–99)

## 2017-07-22 LAB — BPAM FFP
Blood Product Expiration Date: 201811092359
Blood Product Expiration Date: 201811092359
ISSUE DATE / TIME: 201811040053
ISSUE DATE / TIME: 201811040414
UNIT TYPE AND RH: 6200
Unit Type and Rh: 6200

## 2017-07-22 LAB — BASIC METABOLIC PANEL
ANION GAP: 11 (ref 5–15)
BUN: 54 mg/dL — AB (ref 6–20)
CO2: 25 mmol/L (ref 22–32)
Calcium: 8.2 mg/dL — ABNORMAL LOW (ref 8.9–10.3)
Chloride: 105 mmol/L (ref 101–111)
Creatinine, Ser: 1.54 mg/dL — ABNORMAL HIGH (ref 0.44–1.00)
GFR calc Af Amer: 35 mL/min — ABNORMAL LOW (ref >60)
GFR calc non Af Amer: 30 mL/min — ABNORMAL LOW (ref >60)
GLUCOSE: 212 mg/dL — AB (ref 65–99)
POTASSIUM: 4 mmol/L (ref 3.5–5.1)
Sodium: 141 mmol/L (ref 135–145)

## 2017-07-22 LAB — PREPARE FRESH FROZEN PLASMA
UNIT DIVISION: 0
Unit division: 0

## 2017-07-22 LAB — PREPARE PLATELET PHERESIS: Unit division: 0

## 2017-07-22 LAB — BPAM PLATELET PHERESIS
BLOOD PRODUCT EXPIRATION DATE: 201811052359
ISSUE DATE / TIME: 201811040812
UNIT TYPE AND RH: 5100

## 2017-07-22 LAB — PROTIME-INR
INR: 1.27
PROTHROMBIN TIME: 15.8 s — AB (ref 11.4–15.2)

## 2017-07-22 LAB — APTT: aPTT: 30 seconds (ref 24–36)

## 2017-07-22 MED ORDER — TORSEMIDE 20 MG PO TABS
20.0000 mg | ORAL_TABLET | Freq: Every day | ORAL | Status: DC
  Administered 2017-07-23 – 2017-07-27 (×5): 20 mg via ORAL
  Filled 2017-07-22 (×5): qty 1

## 2017-07-22 MED ORDER — DILTIAZEM HCL ER COATED BEADS 240 MG PO CP24
240.0000 mg | ORAL_CAPSULE | Freq: Every day | ORAL | Status: DC
  Administered 2017-07-23 – 2017-07-29 (×7): 240 mg via ORAL
  Filled 2017-07-22 (×7): qty 1

## 2017-07-22 MED ORDER — FAMOTIDINE 20 MG PO TABS
20.0000 mg | ORAL_TABLET | Freq: Two times a day (BID) | ORAL | Status: DC
  Administered 2017-07-22 – 2017-07-29 (×14): 20 mg via ORAL
  Filled 2017-07-22 (×14): qty 1

## 2017-07-22 MED ORDER — IMMUNE GLOBULIN (HUMAN) 5 GM/50ML IV SOLN
400.0000 mg/kg | INTRAVENOUS | Status: DC
  Administered 2017-07-22 – 2017-07-25 (×4): 35 g via INTRAVENOUS
  Filled 2017-07-22 (×4): qty 50

## 2017-07-22 MED ORDER — MORPHINE SULFATE (PF) 4 MG/ML IV SOLN
3.0000 mg | Freq: Once | INTRAVENOUS | Status: AC
  Administered 2017-07-22: 3 mg via INTRAVENOUS
  Filled 2017-07-22: qty 1

## 2017-07-22 MED ORDER — VANCOMYCIN HCL IN DEXTROSE 1-5 GM/200ML-% IV SOLN
1000.0000 mg | INTRAVENOUS | Status: DC
  Administered 2017-07-22: 1000 mg via INTRAVENOUS
  Filled 2017-07-22: qty 200

## 2017-07-22 MED ORDER — GABAPENTIN 300 MG PO CAPS
300.0000 mg | ORAL_CAPSULE | Freq: Two times a day (BID) | ORAL | Status: DC
  Administered 2017-07-22 – 2017-07-29 (×14): 300 mg via ORAL
  Filled 2017-07-22 (×14): qty 1

## 2017-07-22 MED ORDER — METOPROLOL TARTRATE 50 MG PO TABS
100.0000 mg | ORAL_TABLET | Freq: Two times a day (BID) | ORAL | Status: DC
  Administered 2017-07-22 – 2017-07-29 (×15): 100 mg via ORAL
  Filled 2017-07-22 (×7): qty 2
  Filled 2017-07-22: qty 4
  Filled 2017-07-22 (×7): qty 2

## 2017-07-22 MED ORDER — INSULIN DETEMIR 100 UNIT/ML ~~LOC~~ SOLN
6.0000 [IU] | Freq: Every day | SUBCUTANEOUS | Status: DC
  Administered 2017-07-22 – 2017-07-28 (×7): 6 [IU] via SUBCUTANEOUS
  Filled 2017-07-22 (×7): qty 0.06

## 2017-07-22 MED ORDER — IMMUNE GLOBULIN (HUMAN) 10 GM/100ML IV SOLN: 400.0000 mg/kg | INTRAVENOUS | Status: DC

## 2017-07-22 NOTE — Progress Notes (Signed)
[facility] Progress Note     Subjective: CC:  LLE pain. At bedside with WOC to remove original dressing placed by Dr. Zella Richer 11/3. Pt pre-medicated with Morphine.   tachycardic to 120's; afebrile, normotensive during my exam. Objective: Vital signs in last 24 hours: Temp:  [97.1 F (36.2 C)-98.5 F (36.9 C)] 97.8 F (36.6 C) (11/05 0800) Pulse Rate:  [93-129] 121 (11/05 1035) Resp:  [12-24] 21 (11/05 0800) BP: (110-167)/(41-129) 140/60 (11/05 1033) SpO2:  [96 %-100 %] 100 % (11/05 0800) Last BM Date: 07/20/17  Intake/Output from previous day: 11/04 0701 - 11/05 0700 In: 1165.8 [Blood:965.8; IV Piggyback:200] Out: 835 [Urine:835] Intake/Output this shift: Total I/O In: 240 [P.O.:240] Out: -   PE: Gen:  Alert, pleasant and cooperative, in obvious pain  HEENT: pupils equal and round, EOMd in tact Card:  Irregularly irregular, pedal pulses 2+ BL Pulm:  Normal effort, clear to auscultation bilaterally Abd: Soft, non-tender, non-distended, bowel sounds present  LLE WOUND: see [facility] nurse note for wound measurements, roughly 20 x 10 cm calf wound with exposed beefy red tissue. Ruptured bullae leaving left calf with appearance of degloving injury. Removing of previous packing at inferior aspect of wound caused venous bleeding, which resolved with pressure. Moderate amount of hematoma evacuated from posterior and lateral leg. There are no signs of infection. No areas of fluctuance. Wound re-dressed with 2 quick clot pads, saline gauze, 2 kerlix, 1 ace wrap.      Psych: A&Ox3   Lab Results:  Recent Labs    07/21/17 1443 07/22/17 0358  WBC 21.1* 8.8  HGB 6.7* 9.3*  HCT 20.3* 27.5*  PLT 195 164   BMET Recent Labs    07/21/17 0331 07/22/17 0358  NA 141 141  K 3.9 4.0  CL 107 105  CO2 25 25  GLUCOSE 186* 212*  BUN 49* 54*  CREATININE 1.20* 1.54*  CALCIUM 8.0* 8.2*   PT/INR Recent Labs    07/20/17 1221 07/22/17 0358  LABPROT 15.8* 15.8*  INR  1.27 1.27   CMP     Component Value Date/Time   NA 141 07/22/2017 0358   NA 142 07/16/2017 0840   NA 136 02/25/2017 1056   K 4.0 07/22/2017 0358   K 3.8 07/16/2017 0840   K 4.4 02/25/2017 1056   CL 105 07/22/2017 0358   CL 104 07/16/2017 0840   CO2 25 07/22/2017 0358   CO2 30 07/16/2017 0840   CO2 25 02/25/2017 1056   GLUCOSE 212 (H) 07/22/2017 0358   GLUCOSE 138 (H) 07/16/2017 0840   BUN 54 (H) 07/22/2017 0358   BUN 19 07/16/2017 0840   BUN 49.1 (H) 02/25/2017 1056   CREATININE 1.54 (H) 07/22/2017 0358   CREATININE 1.1 07/16/2017 0840   CREATININE 1.6 (H) 02/25/2017 1056   CALCIUM 8.2 (L) 07/22/2017 0358   CALCIUM 9.1 07/16/2017 0840   CALCIUM 9.3 02/25/2017 1056   PROT 6.1 (L) 07/20/2017 1221   PROT 6.5 07/16/2017 0840   PROT 7.3 02/25/2017 1056   ALBUMIN 3.1 (L) 07/20/2017 1221   ALBUMIN 2.8 (L) 07/16/2017 0840   ALBUMIN 3.5 05/03/2017 1345   ALBUMIN 3.3 (L) 02/25/2017 1056   AST 17 07/20/2017 1221   AST 13 07/16/2017 0840   AST 19 02/25/2017 1056   ALT 18 07/20/2017 1221   ALT 17 07/16/2017 0840   ALT 23 02/25/2017 1056   ALKPHOS 69 07/20/2017 1221   ALKPHOS 93 (H) 07/16/2017 0840   ALKPHOS 91 02/25/2017 1056  BILITOT 0.9 07/20/2017 1221   BILITOT 0.90 07/16/2017 0840   BILITOT 0.83 02/25/2017 1056   GFRNONAA 30 (L) 07/22/2017 0358   GFRAA 35 (L) 07/22/2017 0358   Lipase     Component Value Date/Time   LIPASE 21 01/18/2017 1518       Studies/Results: Dg Tibia/fibula Left  Result Date: 07/20/2017 CLINICAL DATA:  New left lower extremity wound since this morning. The wound has tripled in size since this morning. EXAM: LEFT TIBIA AND FIBULA - 2 VIEW COMPARISON:  08/10/2013. FINDINGS: Large proximal left lower leg soft tissue ulceration with surrounding bandage material. Multiple loculations of associated gas. No periosteal reaction or bone destruction. Diffuse soft tissue swelling. Atheromatous arterial calcifications. Extensive left knee degenerative  changes. IMPRESSION: 1. Large proximal left lower leg soft tissue ulceration with associated gas, suspicious for a gas-forming organism. 2. No evidence of underlying osteomyelitis. 3. Marked left knee degenerative changes. Electronically Signed   By: Claudie Revering M.D.   On: 07/20/2017 13:39   Ct Head Wo Contrast  Result Date: 07/20/2017 CLINICAL DATA:  Altered mental status today. EXAM: CT HEAD WITHOUT CONTRAST TECHNIQUE: Contiguous axial images were obtained from the base of the skull through the vertex without intravenous contrast. COMPARISON:  06/26/2017. FINDINGS: Brain: Diffusely enlarged ventricles and subarachnoid spaces. Patchy white matter low density in both cerebral hemispheres. No intracranial hemorrhage, mass lesion or CT evidence of acute infarction. Vascular: No hyperdense vessel or unexpected calcification. Skull: Normal. Negative for fracture or focal lesion. Sinuses/Orbits: Status post bilateral cataract extraction. Unremarkable paranasal sinuses. Other: None. IMPRESSION: 1. No acute abnormality. 2. Stable diffuse cerebral and cerebellar atrophy and chronic small vessel white matter ischemic changes in both cerebral hemispheres. Electronically Signed   By: Claudie Revering M.D.   On: 07/20/2017 14:53   Dg Hip Unilat W Or Wo Pelvis 2-3 Views Left  Result Date: 07/20/2017 CLINICAL DATA:  Large left leg wound that has tripled in size since this morning. EXAM: DG HIP (WITH OR WITHOUT PELVIS) 2-3V LEFT COMPARISON:  Abdomen and pelvis CT dated 12/20/2012. FINDINGS: A right hip bipolar prosthesis is again demonstrated as well as lower lumbar spine laminectomy defects and fixation hardware. Left hip degenerative changes with marked superior joint space narrowing and some subarticular sclerosis and cyst formation, with mild progression. Interval ossification in the region of the iliopsoas tendon attachment. No fracture or dislocation. Atheromatous arterial calcifications. IMPRESSION: 1. Moderate to  marked left hip degenerative changes with mild progression. 2. Interval calcification in the region of the distal iliopsoas tendon. 3. No acute abnormality. Electronically Signed   By: Claudie Revering M.D.   On: 07/20/2017 13:43    Anti-infectives: Anti-infectives (From admission, onward)   Start     Dose/Rate Route Frequency Ordered Stop   07/22/17 2000  vancomycin (VANCOCIN) 1,250 mg in sodium chloride 0.9 % 250 mL IVPB     1,250 mg 166.7 mL/hr over 90 Minutes Intravenous Every 48 hours 07/20/17 2132     07/21/17 0400  piperacillin-tazobactam (ZOSYN) IVPB 3.375 g     3.375 g 12.5 mL/hr over 240 Minutes Intravenous Every 8 hours 07/20/17 2114     07/20/17 2000  piperacillin-tazobactam (ZOSYN) IVPB 3.375 g     3.375 g 100 mL/hr over 30 Minutes Intravenous STAT 07/20/17 1937 07/20/17 2137   07/20/17 2000  vancomycin (VANCOCIN) 1,750 mg in sodium chloride 0.9 % 500 mL IVPB     1,750 mg 250 mL/hr over 120 Minutes Intravenous STAT 07/20/17 1939 07/20/17 2307  Assessment/Plan LLE wound 2/2 ruptured hemorrhagic bullae  - afebrile, transferring to floor today on telemetry  - continue prophylactic abx today  - hgb 9.3, hct 27.5 from 6.7/20.3 s/p 2 units pRBCs/FFP/platelets - No role for surgical debridement at this time. Surgery will plan to change dressing again tomorrow with WOC. I will try to arrange for a surgeon to evaluate the wound at this time.     LOS: 2 days    Jill Alexanders , [facility] 07/22/2017, 12:57 PM Pager: [phone] Consults: [phone] Mon-Fri 7:00 am-4:30 pm Sat-Sun 7:00 am-11:30 am

## 2017-07-22 NOTE — Progress Notes (Signed)
Date:  July 22, 2017 Chart reviewed for concurrent status and case management needs.  Will continue to follow patient progress.  Discharge Planning: following for needs  Expected discharge date: July 25, 2017  Velva Harman, BSN, [place], [place]

## 2017-07-22 NOTE — Consult Note (Signed)
[facility] Nurse wound consult note Reason for Consult: LLE avulsion with hematoma sustained on 11/3.  First surgical dressing change.  Assisted in assessment and dressing change by both [facility] PAs, J. Focht and L. Simaann Wound type: Trauma Pressure Injury POA: N/A Measurement: 20cm x 13cm with 3cm depth.  Wound extends 4cm distally where there is an adherent blood clot and a 4cm x 2cm with 2cm elevation blood clot at 12 o'clock. The skin flap of the avulsed tissue measures 15cm x 10cm Wound bed: red, with bleeding and serosanguinous exudate. Drainage (amount, consistency, odor) Moderate amount of bleeding when adherent dressing material removed.  Periwound: Dressing procedure/placement/frequency: Old dressings (imitial surgical, placed 48 hours ago) gently removed. Nursing had been reinforcing since application. Bleeding ensued. Homeostasis achieved with several pieces of Quick Clot and firm hand pressure for 5 minutes. Wound photo taken by L. Samaann for medical record. Wound care performed once  Bleeding and wound are stabilized; two pieces of Quick Clot (one folded and inserted deep into defect, the second folded and placed more superficially). Avulsed tissue lifted and placed over Quick Clot hemostatic dressing and a moist saline dressing (x2) placed over the tissue that is not covered by the biologic dressing (avulsed tissue). Dry gauze 4x4s used to cover, topped with ABD pads and secured with 2 rolls of Kerlix roll gauze. ACE bandage applied with light compression and LE elevated.  [facility] to see in am (either by surgeon or PA); WOC RN is happy to assist. [place] nursing team will follow along with [facility], and will remain available to this patient, the nursing and medical teams.   Thanks, Maudie Flakes, MSN, RN, [place], [facility]  Pager# [phone]

## 2017-07-22 NOTE — Progress Notes (Signed)
[facility] PROGRESS NOTE  [name] [name] NOM:[id number] DOB: Jun 06, 1931 DOA: 07/20/2017 PCP: Lajean Manes, MD  Interim summary and HPI 83 y.o. female With history of ITP, A. fib, DVT on anticoagulation, is sent from nursing home to emergency room due to rapid enlarging leg wound. Patient with active bleeding and rapid hemodynamically compromising.  Assessment/Plan: 1-LLE with large bleeding wound -with concerns for reaction from lovenox -bleeding has slow down significantly -continue wound care -continue prophylactic antibiotics.  -continue decadron -follow hgb trend   2-ITP -continue decadron and now started on IVIG -follow rec's from oncology -continue platelet transfusion and FFP as needed; platelet count 164 today.   3-right upper extremity non-occlusive DVT and hx of A. Fib -CHADsVASC 3 -was on chronic lovenox -stopped now with active bleeding   4-chronic paroxysmal A. Fib -will continue digoxin and lopressor -continue to follow on telemetry -no anticoagulation in setting of active bleeding  -will resume cardizem  5-chronic diastolic HF -compensated -will follow daily weight and strict intake and output -continue lasix (as BP tolerates) given ongoing transfusion and IVF's given with abx's -will adjust diuretics if volume required it  6-AKI on CKD: stage 3 at baseline -just mild worsening on her Cr -will continue to monitor trend -continue minimizing nephrotoxic agents  7-insulin dependent diabetes with renal dysfunction -will continue SSI  8-acute blood loss anemia -from blood loss out of LLE wound -improved with transfusion -so far has received 4 units of PRBC, 2 units of FFP and 1 unit of platelets -Hgb 9.3 today  Code Status: DNR Family Communication: no family at bedside  Disposition Plan: will transfer to telemetry floor, continue supportive care, follow cell count and follow wound care/oncology service recommendations.    Consultants:  CCS  Oncology   Procedures:  See below for x-ray reports   Antibiotics:  vanc and zosyn   HPI/Subjective: No fever, denies CP. reports feeling slightly better, even still with intermittent pain in her left leg. No abd pain, no nausea, no vomiting.   Objective: Vitals:   07/22/17 0600 07/22/17 0800  BP: (!) 147/91   Pulse: 94   Resp: 16   Temp:  97.8 F (36.6 C)  SpO2: 97%     Intake/Output Summary (Last 24 hours) at 07/22/2017 0829 Last data filed at 07/22/2017 0458 Gross per 24 hour  Intake 1165.83 ml  Output 535 ml  Net 630.83 ml   Filed Weights   07/20/17 1844 07/20/17 2000  Weight: 84.3 kg (185 lb 13.6 oz) 84.3 kg (185 lb 13.6 oz)    Exam:   General:  Afebrile, no CP, no SOB. No nausea, no vomiting, continue having pain in her LLE.  Cardiovascular: irregular, no rubs, no gallops, no JVD.   Respiratory: no wheezing, no crackles, good air movement, normal resp effort.  Abdomen: soft, NT, ND, positive BS  Musculoskeletal: LLE with compressive dressing in place, no cyanosis, no clubbing in her feet, patient reporting pain with mild palpation and movement.   Skin: multiple bruises, redness and petechiae affecting lower back, upper and lower extremities. LLE with biggest wound, with compressive dressing in place; appears to have slow down/stopped active bleeding as seen yesterday.  Data Reviewed: Basic Metabolic Panel: Recent Labs  Lab 07/16/17 0840 07/20/17 1221 07/20/17 1247 07/21/17 0331 07/22/17 0358  NA 142 136 139 141 141  K 3.8 4.3 4.2 3.9 4.0  CL 104 103 102 107 105  CO2 30 24  --  25 25  GLUCOSE 138* 249* 247* 186*  212*  BUN 19 45* 42* 49* 54*  CREATININE 1.1 1.16* 1.30* 1.20* 1.54*  CALCIUM 9.1 8.6*  --  8.0* 8.2*   Liver Function Tests: Recent Labs  Lab 07/16/17 0840 07/20/17 1221  AST 13 17  ALT 17 18  ALKPHOS 93* 69  BILITOT 0.90 0.9  PROT 6.5 6.1*  ALBUMIN 2.8* 3.1*   CBC: Recent Labs  Lab  07/16/17 0840 07/20/17 1221 07/20/17 1247 07/21/17 0331 07/21/17 1443 07/22/17 0358  WBC 9.1 13.0*  --  14.7* 21.1* 8.8  NEUTROABS 7.2* 12.0*  --  12.9* 20.6* 7.7  HGB 11.8 11.0* 12.2 8.0* 6.7* 9.3*  HCT 38.7 35.1* 36.0 25.0* 20.3* 27.5*  MCV 88 86.0  --  85.3 86.8 87.3  PLT 33 Platelet count consistent in citrate* 160  --  82* 195 164   BNP (last 3 results) Recent Labs    11/17/16 1500 01/18/17 1524 06/26/17 1308  BNP 1,492.9* 428.7* 704.4*    CBG: Recent Labs  Lab 07/21/17 1634 07/21/17 2202 07/22/17 0738  GLUCAP 355* 262* 201*    Recent Results (from the past 240 hour(s))  MRSA PCR Screening     Status: Abnormal   Collection Time: 07/20/17  8:00 PM  Result Value Ref Range Status   MRSA by PCR POSITIVE (A) NEGATIVE Final    Comment:        The GeneXpert MRSA Assay (FDA approved for NASAL specimens only), is one component of a comprehensive MRSA colonization surveillance program. It is not intended to diagnose MRSA infection nor to guide or monitor treatment for MRSA infections. RESULT CALLED TO, READ BACK BY AND VERIFIED WITH: R MACINTOSH,RN _0  07/20/17 MKELLY      Studies: Dg Tibia/fibula Left  Result Date: 07/20/2017 CLINICAL DATA:  New left lower extremity wound since this morning. The wound has tripled in size since this morning. EXAM: LEFT TIBIA AND FIBULA - 2 VIEW COMPARISON:  08/10/2013. FINDINGS: Large proximal left lower leg soft tissue ulceration with surrounding bandage material. Multiple loculations of associated gas. No periosteal reaction or bone destruction. Diffuse soft tissue swelling. Atheromatous arterial calcifications. Extensive left knee degenerative changes. IMPRESSION: 1. Large proximal left lower leg soft tissue ulceration with associated gas, suspicious for a gas-forming organism. 2. No evidence of underlying osteomyelitis. 3. Marked left knee degenerative changes. Electronically Signed   By: Claudie Revering M.D.   On: 07/20/2017  13:39   Ct Head Wo Contrast  Result Date: 07/20/2017 CLINICAL DATA:  Altered mental status today. EXAM: CT HEAD WITHOUT CONTRAST TECHNIQUE: Contiguous axial images were obtained from the base of the skull through the vertex without intravenous contrast. COMPARISON:  06/26/2017. FINDINGS: Brain: Diffusely enlarged ventricles and subarachnoid spaces. Patchy white matter low density in both cerebral hemispheres. No intracranial hemorrhage, mass lesion or CT evidence of acute infarction. Vascular: No hyperdense vessel or unexpected calcification. Skull: Normal. Negative for fracture or focal lesion. Sinuses/Orbits: Status post bilateral cataract extraction. Unremarkable paranasal sinuses. Other: None. IMPRESSION: 1. No acute abnormality. 2. Stable diffuse cerebral and cerebellar atrophy and chronic small vessel white matter ischemic changes in both cerebral hemispheres. Electronically Signed   By: Claudie Revering M.D.   On: 07/20/2017 14:53   Dg Hip Unilat W Or Wo Pelvis 2-3 Views Left  Result Date: 07/20/2017 CLINICAL DATA:  Large left leg wound that has tripled in size since this morning. EXAM: DG HIP (WITH OR WITHOUT PELVIS) 2-3V LEFT COMPARISON:  Abdomen and pelvis CT dated 12/20/2012. FINDINGS: A right hip  bipolar prosthesis is again demonstrated as well as lower lumbar spine laminectomy defects and fixation hardware. Left hip degenerative changes with marked superior joint space narrowing and some subarticular sclerosis and cyst formation, with mild progression. Interval ossification in the region of the iliopsoas tendon attachment. No fracture or dislocation. Atheromatous arterial calcifications. IMPRESSION: 1. Moderate to marked left hip degenerative changes with mild progression. 2. Interval calcification in the region of the distal iliopsoas tendon. 3. No acute abnormality. Electronically Signed   By: Claudie Revering M.D.   On: 07/20/2017 13:43    Scheduled Meds: . Chlorhexidine Gluconate Cloth  6 each  Topical Q0600  . digoxin  0.0625 mg Oral Daily  . furosemide  40 mg Intravenous Daily  . Immune Globulin 10%  400 mg/kg Intravenous Q24 Hr x 5  . insulin aspart  0-5 Units Subcutaneous QHS  . insulin aspart  0-9 Units Subcutaneous TID WC  . insulin detemir  6 Units Subcutaneous QHS  . metoprolol tartrate  100 mg Oral BID  . mupirocin ointment  1 application Nasal BID  . polyvinyl alcohol  2 drop Both Eyes BID  . sodium chloride  1 spray Each Nare TID  . traMADol  50 mg Oral TID   Continuous Infusions: . sodium chloride    . sodium chloride    . dexamethasone (DECADRON) IVPB [facility] Stopped (07/21/17 0934)  . piperacillin-tazobactam (ZOSYN)  IV 3.375 g (07/22/17 0458)  . vancomycin      Active Problems:   Sepsis ([place])   Bleeding from wound    Time spent: 35 minutes    Barton Dubois  [facility] Pager [phone]. If 7PM-7AM, please contact night-coverage at [email or web address], password [facility] 07/22/2017, 8:29 AM  LOS: 2 days

## 2017-07-22 NOTE — Progress Notes (Signed)
[name] [name] has responded very well to Decadron.  Her platelet count is now 164,000.  I am going to try her on low-dose IVIG.  I will try her on a 5-day protocol.  I do not think she can handle anything higher than this because of the fluid load.  Hopefully, she stopped bleeding.  Her hemoglobin was 6.7 yesterday.  I think she got transfused a couple units.  Her hemoglobin today is 8.9.3.  Her white cell count is 8.8.  She has wrappings about her left lower leg.  The hematoma on the right lower leg seems to be a little flatter.  She still has some scattered ecchymoses.  She shows atrial fibrillation.  She is off Lovenox.  She is at a high risk for a cerebrovascular event.  However, I think she is showing [place] that her risk of bleeding clearly is a huge problem.  Her INR and PTT are okay.  Her physical exam shows a temperature of 98.  Pulse 101.  Blood pressure 128/61.  There really is no change in her overall physical exam.  The left lower leg is wrapped.  She does have a dressing on the right lower leg.  She does have some scattered ecchymoses.  They do not look any worse.  Again, I think that this frankly had the bleeding from her Lovenox and not from ITP.  Rarely, do patients with ITP bleed, even with a platelet count that is less than 10,000.  I will try her on low-dose IVIG.  She is accepting of this.  She really loves the nursing staff in the ICU.  She is grateful for their compassion.  Lattie Haw, MD  Oswaldo Milian [phone]

## 2017-07-22 NOTE — Progress Notes (Signed)
Assume care of patient from step down. Agree with previous RNs assessment. Will continue to monitor closely.

## 2017-07-22 NOTE — Progress Notes (Signed)
Inpatient Diabetes Program Recommendations  AACE/ADA: New Consensus Statement on Inpatient Glycemic Control (2015)  Target Ranges:  Prepandial:   less than 140 mg/dL      Peak postprandial:   less than 180 mg/dL (1-2 hours)      Critically ill patients:  140 - 180 mg/dL   Lab Results  Component Value Date   GLUCAP 201 (H) 07/22/2017   HGBA1C 5.8 (H) 12/21/2012    Review of Glycemic Control  Diabetes history: DM2 Outpatient Diabetes medications: Levemir 6 units QHS, Januvia 50 mg QAM, Novolog 0-15 units tidwc Current orders for Inpatient glycemic control: Levemir 6 units QHS (to start tonight), Novolog 0-9 units tidwc    Inpatient Diabetes Program Recommendations:   Add CHO mod med to heart healthy diet. Increase Novolog to 0-15 units tidwc and hs while on steroids. Need updated HgbA1C. Last one - 12/21/2012  Will continue to follow.  Thank you. Lorenda Peck, RD, LDN, CDE Inpatient Diabetes Coordinator [phone]

## 2017-07-22 NOTE — Progress Notes (Signed)
Pharmacy Antibiotic Note  [name] [name] is a 83 y.o. female admitted on 07/20/2017 with wound infection.  Pharmacy has been consulted for Vancomycin and Zosyn dosing. Noted allergy to cephalexin, but patient has tolerated Zosyn on previous admission.  Today, 07/22/2017:  SCr continues to rise  Plan:  Reduce vancomycin empirically to 1000 mg IV q48 hr - AUC already borderline high and using for prophylaxis rather than treatment of actual infxn.  Plan for Vancomycin levels at steady state, as indicated.  Zosyn 3.375g IV x 1 over 30 minutes, then Zosyn 3.375g IV q8h (infuse over 4 hours).  Monitor renal function (daily SCr), cultures, clinical course.   Height: 5' 4" (162.6 cm) Weight: (did not gt wt due to pts leg needing to be cont. elevated.) IBW/kg (Calculated) : 54.7  Temp (24hrs), Avg:97.7 F (36.5 C), Min:97.1 F (36.2 C), Max:98.5 F (36.9 C)  Recent Labs  Lab 07/16/17 0840 07/20/17 1221 07/20/17 1247 07/20/17 1707 07/21/17 0331 07/21/17 1443 07/22/17 0358  WBC 9.1 13.0*  --   --  14.7* 21.1* 8.8  CREATININE 1.1 1.16* 1.30*  --  1.20*  --  1.54*  LATICACIDVEN  --   --  2.17* 2.31* 2.7*  --   --     Estimated Creatinine Clearance: 28.5 mL/min (A) (by C-G formula based on SCr of 1.54 mg/dL (H)).    Allergies  Allergen Reactions  . Keflex [Cephalexin] Other (See Comments)    Reaction:  Unknown     Antimicrobials this admission: 11/3 >> Vancomycin >> 11/3 >> Zosyn >>  Dose adjustments this admission: 11/5: decrease vanc to 1g q48 with worsening SCr   Microbiology results: 11/3 BCx: ng1d 11/3 MRSA PCR: positive  Thank you for allowing pharmacy to be a part of this patient's care.   Lindell Spar, PharmD, BCPS Pager: [phone] 07/20/2017 8:18 PM

## 2017-07-23 ENCOUNTER — Other Ambulatory Visit: Payer: Medicare Other

## 2017-07-23 ENCOUNTER — Ambulatory Visit: Payer: Medicare Other | Admitting: Family

## 2017-07-23 ENCOUNTER — Ambulatory Visit: Payer: Medicare Other

## 2017-07-23 LAB — CBC WITH DIFFERENTIAL/PLATELET
BASOS ABS: 0 10*3/uL (ref 0.0–0.1)
BASOS PCT: 0 %
Eosinophils Absolute: 0 10*3/uL (ref 0.0–0.7)
Eosinophils Relative: 0 %
HCT: 24.1 % — ABNORMAL LOW (ref 36.0–46.0)
Hemoglobin: 8.1 g/dL — ABNORMAL LOW (ref 12.0–15.0)
LYMPHS PCT: 8 %
Lymphs Abs: 0.8 10*3/uL (ref 0.7–4.0)
MCH: 29.9 pg (ref 26.0–34.0)
MCHC: 33.6 g/dL (ref 30.0–36.0)
MCV: 88.9 fL (ref 78.0–100.0)
MONO ABS: 0.7 10*3/uL (ref 0.1–1.0)
MONOS PCT: 7 %
NEUTROS PCT: 85 %
NRBC: 2 /100{WBCs} — AB
Neutro Abs: 8.1 10*3/uL — ABNORMAL HIGH (ref 1.7–7.7)
PLATELETS: 254 10*3/uL (ref 150–400)
RBC: 2.71 MIL/uL — ABNORMAL LOW (ref 3.87–5.11)
RDW: 17.2 % — AB (ref 11.5–15.5)
WBC: 9.6 10*3/uL (ref 4.0–10.5)

## 2017-07-23 LAB — BASIC METABOLIC PANEL
Anion gap: 8 (ref 5–15)
BUN: 46 mg/dL — AB (ref 6–20)
CHLORIDE: 105 mmol/L (ref 101–111)
CO2: 25 mmol/L (ref 22–32)
Calcium: 8.2 mg/dL — ABNORMAL LOW (ref 8.9–10.3)
Creatinine, Ser: 1.27 mg/dL — ABNORMAL HIGH (ref 0.44–1.00)
GFR calc Af Amer: 44 mL/min — ABNORMAL LOW (ref >60)
GFR calc non Af Amer: 38 mL/min — ABNORMAL LOW (ref >60)
GLUCOSE: 165 mg/dL — AB (ref 65–99)
POTASSIUM: 3.8 mmol/L (ref 3.5–5.1)
Sodium: 138 mmol/L (ref 135–145)

## 2017-07-23 LAB — GLUCOSE, CAPILLARY
GLUCOSE-CAPILLARY: 278 mg/dL — AB (ref 65–99)
Glucose-Capillary: 132 mg/dL — ABNORMAL HIGH (ref 65–99)
Glucose-Capillary: 197 mg/dL — ABNORMAL HIGH (ref 65–99)
Glucose-Capillary: 234 mg/dL — ABNORMAL HIGH (ref 65–99)

## 2017-07-23 LAB — PREPARE RBC (CROSSMATCH)

## 2017-07-23 LAB — IRON AND TIBC
Iron: 77 ug/dL (ref 28–170)
SATURATION RATIOS: 25 % (ref 10.4–31.8)
TIBC: 309 ug/dL (ref 250–450)
UIBC: 232 ug/dL

## 2017-07-23 LAB — PROTIME-INR
INR: 1.31
Prothrombin Time: 16.2 seconds — ABNORMAL HIGH (ref 11.4–15.2)

## 2017-07-23 LAB — FERRITIN: FERRITIN: 63 ng/mL (ref 11–307)

## 2017-07-23 LAB — APTT: aPTT: 29 seconds (ref 24–36)

## 2017-07-23 MED ORDER — SODIUM CHLORIDE 0.9 % IV SOLN: Freq: Once | INTRAVENOUS | Status: DC

## 2017-07-23 MED ORDER — FUROSEMIDE 10 MG/ML IJ SOLN
40.0000 mg | Freq: Once | INTRAMUSCULAR | Status: DC
  Filled 2017-07-23: qty 4

## 2017-07-23 MED ORDER — FAMCICLOVIR 500 MG PO TABS
250.0000 mg | ORAL_TABLET | Freq: Every day | ORAL | Status: DC
  Administered 2017-07-23 – 2017-07-29 (×7): 250 mg via ORAL
  Filled 2017-07-23 (×7): qty 0.5

## 2017-07-23 NOTE — Progress Notes (Signed)
[facility] PROGRESS NOTE  [name] [name] WCB:[id number] DOB: Feb 12, 1931 DOA: 07/20/2017 PCP: Lajean Manes, MD  Interim summary and HPI 83 y.o. female With history of ITP, A. fib, DVT on anticoagulation, is sent from nursing home to emergency room due to rapid enlarging leg wound. Patient with active bleeding and rapid hemodynamically compromising.  Assessment/Plan: 1-LLE with large bleeding wound -with concerns for reaction from lovenox -bleeding has slow down significantly; and the pain has also improved vastly -Continue as needed analgesics. -continue wound care -continue prophylactic antibiotics.  -continue decadron -follow hgb trend and transfuse further as needed (see below).  2-ITP -continue decadron and IVIG as instructed by oncology service -continue platelet transfusion and FFP as needed -platelet count 254 today.   3-right upper extremity non-occlusive DVT and hx of A. Fib -CHADsVASC 3 -was on chronic lovenox -stopped now with active bleeding and concerns for reaction to Lovenox as precipitin of patient bleeding.  4-chronic paroxysmal A. Fib -will continue digoxin, Cardizem and lopressor -Rate controlled  -continue to follow on telemetry -no anticoagulation in setting of active bleeding   5-chronic diastolic HF -compensated -Continue daily weight and strict intake and output -continue lasix (as BP tolerates) given ongoing transfusion and IVF's given with abx's -will adjust diuretics if volume required it -Patient denies shortness of breath.  6-AKI on CKD: stage 3 at baseline -Will follow creatinine trend -continue minimizing nephrotoxic agents -Which has improved with IV fluids and better perfusion. -Creatinine is 1.27 today; essentially back to baseline.  7-insulin dependent diabetes with renal dysfunction -will continue SSI  8-acute blood loss anemia -from blood loss out of LLE wound -improved with transfusion -so far has received 4 units of  PRBC, 2 units of FFP and 1 unit of platelets -Hgb 8.1 today -Per oncology service will transfuse 2 more units of red blood cells.  Code Status: DNR Family Communication: no family at bedside  Disposition Plan: Continue supportive care, follow cell count and recommendations by the hematology oncology service for further transfusion needs.  Continue Decadron, continue IVIG.  Close follow-up to patient's volume status.  Continue wound care  Consultants:  CCS  Oncology   Procedures:  See below for x-ray reports   Antibiotics:  vanc and zosyn   HPI/Subjective: No fever, no chest pain, no shortness of breath.  Patient denies nausea, vomiting and dysuria.  Still reporting some left lower extremity pain and some mild discomfort in her back.  Objective: Vitals:   07/23/17 1736 07/23/17 1817  BP: (!) 137/57 (!) 152/67  Pulse: 70 78  Resp: 16 18  Temp: 98.5 F (36.9 C) 99.5 F (37.5 C)  SpO2: 92% 94%    Intake/Output Summary (Last 24 hours) at 07/23/2017 1834 Last data filed at 07/23/2017 1500 Gross per 24 hour  Intake 748 ml  Output 1150 ml  Net -402 ml   Filed Weights   07/20/17 1844 07/20/17 2000  Weight: 84.3 kg (185 lb 13.6 oz) 84.3 kg (185 lb 13.6 oz)    Exam:   General: No fever, no chest pain, no shortness of breath.  No nausea, no vomiting.  Patient continued to have some pain in her left lower extremity but is improved in comparison to what it was before.  She continued to have some bleeding during dressing changes but that is also something that had significantly improved.    Cardiovascular: Irregular rhythm, no rubs, no gallops, no JVD.    Respiratory: Good air movement bilaterally, no wheezing, decreased breath sounds at  the bases but no frank crackles auscultated, good oxygen saturation on room air. Normal respiratory effort.  Abdomen: Soft, nontender, nondistended, positive bowel sounds.   Musculoskeletal: Left lower extremity with clean dressings in  place, no cyanosis appreciated no erythema or further extension of the patient's wound.  Patient was able to move and allow palpation of her left lower extremity during today's exam.  Skin: Skin examination essentially unchanged from findings on 11/5; demonstrating: Multiple bruises, redness and petechiae affecting lower back, upper and lower extremities. LLE with biggest wound, with compressive dressing in place; appears to have slow down/stopped active bleeding as seen yesterday.  Data Reviewed: Basic Metabolic Panel: Recent Labs  Lab 07/20/17 1221 07/20/17 1247 07/21/17 0331 07/22/17 0358 07/23/17 0503  NA 136 139 141 141 138  K 4.3 4.2 3.9 4.0 3.8  CL 103 102 107 105 105  CO2 24  --  _0 GLUCOSE 249* 247* 186* 212* 165*  BUN 45* 42* 49* 54* 46*  CREATININE 1.16* 1.30* 1.20* 1.54* 1.27*  CALCIUM 8.6*  --  8.0* 8.2* 8.2*   Liver Function Tests: Recent Labs  Lab 07/20/17 1221  AST 17  ALT 18  ALKPHOS 69  BILITOT 0.9  PROT 6.1*  ALBUMIN 3.1*   CBC: Recent Labs  Lab 07/20/17 1221 07/20/17 1247 07/21/17 0331 07/21/17 1443 07/22/17 0358 07/23/17 0503  WBC 13.0*  --  14.7* 21.1* 8.8 9.6  NEUTROABS 12.0*  --  12.9* 20.6* 7.7 8.1*  HGB 11.0* 12.2 8.0* 6.7* 9.3* 8.1*  HCT 35.1* 36.0 25.0* 20.3* 27.5* 24.1*  MCV 86.0  --  85.3 86.8 87.3 88.9  PLT 160  --  82* 195 164 254   BNP (last 3 results) Recent Labs    11/17/16 1500 01/18/17 1524 06/26/17 1308  BNP 1,492.9* 428.7* 704.4*    CBG: Recent Labs  Lab 07/22/17 1707 07/22/17 2127 07/23/17 0739 07/23/17 1245 07/23/17 1720  GLUCAP 348* 364* 132* 197* 278*    Recent Results (from the past 240 hour(s))  Culture, blood (routine x 2)     Status: None (Preliminary result)   Collection Time: 07/20/17  7:55 PM  Result Value Ref Range Status   Specimen Description BLOOD BLOOD LEFT FOREARM  Final   Special Requests   Final    BOTTLES DRAWN AEROBIC ONLY Blood Culture adequate volume   Culture   Final     NO GROWTH 2 DAYS Performed at [facility], [place] [address] [place], [place] [address]    Report Status PENDING  Incomplete  Culture, blood (routine x 2)     Status: None (Preliminary result)   Collection Time: 07/20/17  7:55 PM  Result Value Ref Range Status   Specimen Description BLOOD LEFT ANTECUBITAL  Final   Special Requests   Final    BOTTLES DRAWN AEROBIC ONLY Blood Culture adequate volume   Culture   Final    NO GROWTH 2 DAYS Performed at [facility], [place] [address], [place], [place] [address]    Report Status PENDING  Incomplete  MRSA PCR Screening     Status: Abnormal   Collection Time: 07/20/17  8:00 PM  Result Value Ref Range Status   MRSA by PCR POSITIVE (A) NEGATIVE Final    Comment:        The GeneXpert MRSA Assay (FDA approved for NASAL specimens only), is one component of a comprehensive MRSA colonization surveillance program. It is not intended to diagnose MRSA infection  nor to guide or monitor treatment for MRSA infections. RESULT CALLED TO, READ BACK BY AND VERIFIED WITH: R MACINTOSH,RN _0  07/20/17 MKELLY      Studies: No results found.  Scheduled Meds: . Chlorhexidine Gluconate Cloth  6 each Topical Q0600  . digoxin  0.0625 mg Oral Daily  . diltiazem  240 mg Oral Daily  . famciclovir  250 mg Oral Daily  . famotidine  20 mg Oral BID  . furosemide  40 mg Intravenous Once  . gabapentin  300 mg Oral BID  . Immune Globulin 10%  400 mg/kg Intravenous Q24 Hr x 5  . insulin aspart  0-5 Units Subcutaneous QHS  . insulin aspart  0-9 Units Subcutaneous TID WC  . insulin detemir  6 Units Subcutaneous QHS  . metoprolol tartrate  100 mg Oral BID  . mupirocin ointment  1 application Nasal BID  . polyvinyl alcohol  2 drop Both Eyes BID  . sodium chloride  1 spray Each Nare TID  . torsemide  20 mg Oral Daily  . traMADol  50 mg Oral TID   Continuous Infusions: . sodium chloride    . sodium chloride    . sodium chloride    . dexamethasone  (DECADRON) IVPB [facility] Stopped (07/23/17 1245)  . piperacillin-tazobactam (ZOSYN)  IV Stopped (07/23/17 1654)  . vancomycin Stopped (07/22/17 2306)    Active Problems:   Atrial fibrillation, chronic ([facility])   Chronic diastolic HF (heart failure) ([facility])   Sepsis ([place])   Bleeding from wound   Acute blood loss anemia    Time spent: 30 minutes    Barton Dubois  [facility] Pager [phone]. If 7PM-7AM, please contact night-coverage at [email or web address], password [facility] 07/23/2017, 6:34 PM  LOS: 3 days

## 2017-07-23 NOTE — Progress Notes (Signed)
[name] [name] is about the same.  The hemorrhagic bulla on the left lower leg is still oozing.  Her platelet count is 254K.  Her hemoglobin is down to 8.1.  I suspect she probably will need to be transfused.  She has been getting the IVIG.  She had her first dose yesterday.  This can also cause some "dilution" for her hemoglobin.  Her PT and PTT are normal.  There is no platelet dysfunction from my point of view.  I think the bleeding is just an anatomic issue.  She has had a decreased appetite.  She has had no nausea or vomiting.  There is no increased cough.  There is no increased shortness of breath.  She is now off anticoagulation.  She still has the atrial fibrillation.  On her physical exam, her vital signs all look pretty stable.  Her pulses 95.  Blood pressure 139/80.  Temperature 98.  The left lower leg is still wrapped.  She still has some ecchymoses.  Her lungs sound clear.  Cardiac exam shows a atrial fibrillation that seems to have a little bit of better rate control.  [name] [name] had the large bleed into the left lower leg.  I believe this was Lovenox induced.  I do not think it had anything to do with her ITP as her platelet count was not low when this happened.  She is off Lovenox now.  We will go ahead and transfuse her.  I think this will help.  I think getting her hemoglobin a little bit higher will be somewhat better for her bleeding.  I know that the nursing staff on [address] are doing a great job with her.  [name] Haw, MD  [place] 17:20

## 2017-07-23 NOTE — Care Management Important Message (Signed)
Important Message  Patient Details Important Message  Patient Details IM Letter given to Cookie/Case Manager to present to Patient Name: DEZI BRAUNER MRN: [id number] Date of Birth: Aug 08, 1931   Medicare Important Message Given:  Yes    Kerin Salen 07/23/2017, 11:15 AM Name: LAGUANA DESAUTEL MRN: [id number] Date of Birth: 1931-04-20   Medicare Important Message Given:  Yes    Kerin Salen 07/23/2017, 11:15 AM

## 2017-07-23 NOTE — Progress Notes (Signed)
[facility] Progress Note     Subjective: CC: LLE wound Patient having some pain in LLE with manipulation. Resting comfortably in bed now.  UOP good. VSS.   Objective: Vital signs in last 24 hours: Temp:  [97.5 F (36.4 C)-98.1 F (36.7 C)] 98 F (36.7 C) (11/06 0528) Pulse Rate:  [94-110] 95 (11/06 0528) Resp:  [18-20] 20 (11/06 0528) BP: (139-155)/(70-80) 139/80 (11/06 0528) SpO2:  [89 %-98 %] 98 % (11/06 0528) Last BM Date: 07/20/17  Intake/Output from previous day: 11/05 0701 - 11/06 0700 In: 644 [P.O.:240; IV Piggyback:404] Out: 1150 [Urine:1150] Intake/Output this shift: Total I/O In: 240 [P.O.:240] Out: -   PE: Gen:  Alert, NAD, pleasant Card:  Regular rate and rhythm, pedal pulses 2+ BL Pulm:  Normal effort, clear to auscultation bilaterally Skin: LLE wound clean and dry, gauze removed and no active bleeding noted Psych: A&Ox3   Lab Results:  Recent Labs    07/22/17 0358 07/23/17 0503  WBC 8.8 9.6  HGB 9.3* 8.1*  HCT 27.5* 24.1*  PLT 164 254   BMET Recent Labs    07/22/17 0358 07/23/17 0503  NA 141 138  K 4.0 3.8  CL 105 105  CO2 25 25  GLUCOSE 212* 165*  BUN 54* 46*  CREATININE 1.54* 1.27*  CALCIUM 8.2* 8.2*   PT/INR Recent Labs    07/22/17 0358 07/23/17 0503  LABPROT 15.8* 16.2*  INR 1.27 1.31   CMP     Component Value Date/Time   NA 138 07/23/2017 0503   NA 142 07/16/2017 0840   NA 136 02/25/2017 1056   K 3.8 07/23/2017 0503   K 3.8 07/16/2017 0840   K 4.4 02/25/2017 1056   CL 105 07/23/2017 0503   CL 104 07/16/2017 0840   CO2 25 07/23/2017 0503   CO2 30 07/16/2017 0840   CO2 25 02/25/2017 1056   GLUCOSE 165 (H) 07/23/2017 0503   GLUCOSE 138 (H) 07/16/2017 0840   BUN 46 (H) 07/23/2017 0503   BUN 19 07/16/2017 0840   BUN 49.1 (H) 02/25/2017 1056   CREATININE 1.27 (H) 07/23/2017 0503   CREATININE 1.1 07/16/2017 0840   CREATININE 1.6 (H) 02/25/2017 1056   CALCIUM 8.2 (L) 07/23/2017 0503   CALCIUM 9.1  07/16/2017 0840   CALCIUM 9.3 02/25/2017 1056   PROT 6.1 (L) 07/20/2017 1221   PROT 6.5 07/16/2017 0840   PROT 7.3 02/25/2017 1056   ALBUMIN 3.1 (L) 07/20/2017 1221   ALBUMIN 2.8 (L) 07/16/2017 0840   ALBUMIN 3.5 05/03/2017 1345   ALBUMIN 3.3 (L) 02/25/2017 1056   AST 17 07/20/2017 1221   AST 13 07/16/2017 0840   AST 19 02/25/2017 1056   ALT 18 07/20/2017 1221   ALT 17 07/16/2017 0840   ALT 23 02/25/2017 1056   ALKPHOS 69 07/20/2017 1221   ALKPHOS 93 (H) 07/16/2017 0840   ALKPHOS 91 02/25/2017 1056   BILITOT 0.9 07/20/2017 1221   BILITOT 0.90 07/16/2017 0840   BILITOT 0.83 02/25/2017 1056   GFRNONAA 38 (L) 07/23/2017 0503   GFRAA 44 (L) 07/23/2017 0503   Lipase     Component Value Date/Time   LIPASE 21 01/18/2017 1518       Studies/Results: No results found.  Anti-infectives: Anti-infectives (From admission, onward)   Start     Dose/Rate Route Frequency Ordered Stop   07/22/17 2000  vancomycin (VANCOCIN) 1,250 mg in sodium chloride 0.9 % 250 mL IVPB  Status:  Discontinued  1,250 mg 166.7 mL/hr over 90 Minutes Intravenous Every 48 hours 07/20/17 2132 07/22/17 1346   07/22/17 2000  vancomycin (VANCOCIN) IVPB 1000 mg/200 mL premix     1,000 mg 200 mL/hr over 60 Minutes Intravenous Every 48 hours 07/22/17 1346     07/21/17 0400  piperacillin-tazobactam (ZOSYN) IVPB 3.375 g     3.375 g 12.5 mL/hr over 240 Minutes Intravenous Every 8 hours 07/20/17 2114     07/20/17 2000  piperacillin-tazobactam (ZOSYN) IVPB 3.375 g     3.375 g 100 mL/hr over 30 Minutes Intravenous STAT 07/20/17 1937 07/20/17 2137   07/20/17 2000  vancomycin (VANCOCIN) 1,750 mg in sodium chloride 0.9 % 500 mL IVPB     1,750 mg 250 mL/hr over 120 Minutes Intravenous STAT 07/20/17 1939 07/20/17 2307       Assessment/Plan LLE wound 2/2 ruptured hemorrhagic bullae  - afebrile, transferring to floor today on telemetry  - WBC slightly up to 9.6 - continue prophylactic abx today  - hgb 8.1 from 9.3  yesterday, s/p 2 units pRBCs/FFP/platelets - No role for surgical debridement at this time.   Continue to monitor Hgb. No active bleeding with this dressing change. Change dressing daily, use xeroform gauze. .   LOS: 3 days    Brigid Re , [facility] Surgery 07/23/2017, 10:36 AM Pager: [phone] Consults: [phone] Mon-Fri 7:00 am-4:30 pm Sat-Sun 7:00 am-11:30 am

## 2017-07-23 NOTE — Progress Notes (Signed)
Patient R cheek is red and swollen.  Pt has no c/o pain in this area.  Patient temp up from 98.5 pre blood to 99.5 post 15 minutes start of blood.  Patient appears comfortable, no s/s of distress, other vitals are stable at this time.  Dr. Dyann Kief aware.  Will continue close monitoring of patient.

## 2017-07-23 NOTE — Consult Note (Addendum)
[facility] Nurse wound follow up Wound type: traumatic Measurement:20 x 13 wound with defect now measuring 1.0cm at 12 and 6 o'clock.  Blood clot still present in both of these locations. Middle of wound is covered with epidermal/dermal avulsion. Wound bed: red, moist exposed area with visible blood clot.  No active bleeding Drainage (amount, consistency, odor) scant serous from exposed areas Periwound: scattered ecchymosis Dressing procedure/placement/frequency: Today the Quick Clot is not needed.  We will approximate the skin over the majority of the full thickness wound and apply xeroform to the exposed areas as it is nonadherent, an astringent and antimicrobial. Leg is to be wrapped with an ABD to pad/cover, secured with kerlix roll gauze and mild compression (ACE) as the topper layer. Daily wound care and elevation is indicated. [facility] nursing team will not follow, but will remain available to this patient, the nursing and medical teams.  Please re-consult if needed. Thanks, Maudie Flakes, MSN, RN, [place], Arther Abbott  Pager# [phone]

## 2017-07-24 DIAGNOSIS — D62 Acute posthemorrhagic anemia: Secondary | ICD-10-CM

## 2017-07-24 LAB — TYPE AND SCREEN
ABO/RH(D): A POS
Antibody Screen: NEGATIVE
UNIT DIVISION: 0
UNIT DIVISION: 0
UNIT DIVISION: 0
Unit division: 0
Unit division: 0
Unit division: 0

## 2017-07-24 LAB — BPAM RBC
BLOOD PRODUCT EXPIRATION DATE: 201811202359
BLOOD PRODUCT EXPIRATION DATE: 201811202359
Blood Product Expiration Date: 201811162359
Blood Product Expiration Date: 201811202359
Blood Product Expiration Date: 201811202359
Blood Product Expiration Date: 201811202359
ISSUE DATE / TIME: 201811031605
ISSUE DATE / TIME: 201811032111
ISSUE DATE / TIME: 201811041605
ISSUE DATE / TIME: 201811061739
ISSUE DATE / TIME: 201811062218
ISSUE DATE / TIME: 201811041850
UNIT TYPE AND RH: 6200
UNIT TYPE AND RH: 6200
UNIT TYPE AND RH: 6200
UNIT TYPE AND RH: 6200
UNIT TYPE AND RH: 6200
Unit Type and Rh: 6200

## 2017-07-24 LAB — PROTIME-INR
INR: 1.2
PROTHROMBIN TIME: 15.1 s (ref 11.4–15.2)

## 2017-07-24 LAB — CBC WITH DIFFERENTIAL/PLATELET
BASOS ABS: 0 10*3/uL (ref 0.0–0.1)
BASOS PCT: 0 %
EOS ABS: 0 10*3/uL (ref 0.0–0.7)
Eosinophils Relative: 0 %
HEMATOCRIT: 30.5 % — AB (ref 36.0–46.0)
HEMOGLOBIN: 10.1 g/dL — AB (ref 12.0–15.0)
Lymphocytes Relative: 7 %
Lymphs Abs: 0.6 10*3/uL — ABNORMAL LOW (ref 0.7–4.0)
MCH: 29.1 pg (ref 26.0–34.0)
MCHC: 33.1 g/dL (ref 30.0–36.0)
MCV: 87.9 fL (ref 78.0–100.0)
Monocytes Absolute: 0.3 10*3/uL (ref 0.1–1.0)
Monocytes Relative: 4 %
NEUTROS ABS: 6.9 10*3/uL (ref 1.7–7.7)
NEUTROS PCT: 89 %
Platelets: 282 10*3/uL (ref 150–400)
RBC: 3.47 MIL/uL — ABNORMAL LOW (ref 3.87–5.11)
RDW: 15.9 % — ABNORMAL HIGH (ref 11.5–15.5)
WBC: 7.8 10*3/uL (ref 4.0–10.5)

## 2017-07-24 LAB — GLUCOSE, CAPILLARY
GLUCOSE-CAPILLARY: 258 mg/dL — AB (ref 65–99)
GLUCOSE-CAPILLARY: 239 mg/dL — AB (ref 65–99)
GLUCOSE-CAPILLARY: 340 mg/dL — AB (ref 65–99)
Glucose-Capillary: 217 mg/dL — ABNORMAL HIGH (ref 65–99)

## 2017-07-24 LAB — CREATININE, SERUM
Creatinine, Ser: 1.14 mg/dL — ABNORMAL HIGH (ref 0.44–1.00)
GFR, EST AFRICAN AMERICAN: 50 mL/min — AB (ref >60)
GFR, EST NON AFRICAN AMERICAN: 43 mL/min — AB (ref >60)

## 2017-07-24 LAB — APTT: APTT: 27 s (ref 24–36)

## 2017-07-24 NOTE — Progress Notes (Signed)
[facility] PROGRESS NOTE  BRUNA DILLS PJS:[id number] DOB: Mar 27, 1931 DOA: 07/20/2017   PCP: Lajean Manes, MD  Interim summary and HPI 83 y.o. female With history of ITP, A. fib, DVT on anticoagulation, is sent from nursing home to emergency room due to rapid enlarging leg wound. Patient with active bleeding and rapid hemodynamically compromising.  Assessment/Plan: 1-LLE with large bleeding wound - with concerns for reaction from lovenox - overall clinically improving - continue wound care - continue prophylactic abx, decadron  2-ITP - continue decadron and IVIG as instructed by oncology service - continue platelet transfusion and FFP as needed - CBC in AM  3-right upper extremity non-occlusive DVT and hx of A. Fib - CHADsVASC 3 - was on chronic lovenox - stopped due to active bleeding   4-chronic paroxysmal A. Fib - cont digoxin, cardizem and lopressor  - rate controlled  - no AC due to active bleeding   5-chronic diastolic HF - compensated - monitor daily weights, strict I/O - no dyspnea this AM   6-AKI on CKD: stage 3 at baseline - Cr improving - BMP in AM  7-insulin dependent diabetes with renal dysfunction - cont SSI   8-acute blood loss anemia - from blood loss out of LLE wound - improved with transfusion - so far has received 6 units of PRBC, 2 units of FFP and 1 unit of platelets - Hg overall stable  - CBC in AM  Code Status: DNR Family Communication: no family at bedside  Disposition Plan: to be determined   Consultants:  CCS  Oncology   Procedures:  See below for x-ray reports   Antibiotics:  zosyn   HPI/Subjective: Pt reports feeling better.   Objective: Vitals:   07/24/17 0707 07/24/17 0812  BP: (!) 186/63 (!) 168/71  Pulse: 63   Resp: 20   Temp: 98.2 F (36.8 C)   SpO2: 95%     Intake/Output Summary (Last 24 hours) at 07/24/2017 0858 Last data filed at 07/24/2017 0600 Gross per 24 hour  Intake 1286 ml  Output  900 ml  Net 386 ml   Filed Weights   07/20/17 1844 07/20/17 2000  Weight: 84.3 kg (185 lb 13.6 oz) 84.3 kg (185 lb 13.6 oz)   Physical Exam  Constitutional: Appears well-developed and well-nourished. No distress.  CVS: RRR, S1/S2 +, no murmurs, no gallops, no carotid bruit.  Pulmonary: Effort and breath sounds normal, no stridor, rhonchi, wheezes, rales.  Abdominal: Soft. BS +,  no distension, tenderness, rebound or guarding.  Skin: Multiple bruises, redness and petechiae in lower back area, upper and lower extremities, LLE wound with compressive dressing    Data Reviewed: Basic Metabolic Panel: Recent Labs  Lab 07/20/17 1221 07/20/17 1247 07/21/17 0331 07/22/17 0358 07/23/17 0503 07/24/17 0448  NA 136 139 141 141 138  --   K 4.3 4.2 3.9 4.0 3.8  --   CL 103 102 107 105 105  --   CO2 24  --  _0 --   GLUCOSE 249* 247* 186* 212* 165*  --   BUN 45* 42* 49* 54* 46*  --   CREATININE 1.16* 1.30* 1.20* 1.54* 1.27* 1.14*  CALCIUM 8.6*  --  8.0* 8.2* 8.2*  --    Liver Function Tests: Recent Labs  Lab 07/20/17 1221  AST 17  ALT 18  ALKPHOS 69  BILITOT 0.9  PROT 6.1*  ALBUMIN 3.1*   CBC: Recent Labs  Lab 07/21/17 0331 07/21/17 1443 07/22/17 0358  07/23/17 0503 07/24/17 0448  WBC 14.7* 21.1* 8.8 9.6 7.8  NEUTROABS 12.9* 20.6* 7.7 8.1* 6.9  HGB 8.0* 6.7* 9.3* 8.1* 10.1*  HCT 25.0* 20.3* 27.5* 24.1* 30.5*  MCV 85.3 86.8 87.3 88.9 87.9  PLT 82* 195 164 254 282   BNP (last 3 results) Recent Labs    11/17/16 1500 01/18/17 1524 06/26/17 1308  BNP 1,492.9* 428.7* 704.4*    CBG: Recent Labs  Lab 07/22/17 2127 07/23/17 0739 07/23/17 1245 07/23/17 1720 07/23/17 2217  GLUCAP 364* 132* 197* 278* 234*    Recent Results (from the past 240 hour(s))  Culture, blood (routine x 2)     Status: None (Preliminary result)   Collection Time: 07/20/17  7:55 PM  Result Value Ref Range Status   Specimen Description BLOOD BLOOD LEFT FOREARM  Final   Special  Requests   Final    BOTTLES DRAWN AEROBIC ONLY Blood Culture adequate volume   Culture   Final    NO GROWTH 2 DAYS Performed at [facility], [place] [address] [place], [place] [address]    Report Status PENDING  Incomplete  Culture, blood (routine x 2)     Status: None (Preliminary result)   Collection Time: 07/20/17  7:55 PM  Result Value Ref Range Status   Specimen Description BLOOD LEFT ANTECUBITAL  Final   Special Requests   Final    BOTTLES DRAWN AEROBIC ONLY Blood Culture adequate volume   Culture   Final    NO GROWTH 2 DAYS Performed at [facility], [place] [address] [place], [place] [address]    Report Status PENDING  Incomplete  MRSA PCR Screening     Status: Abnormal   Collection Time: 07/20/17  8:00 PM  Result Value Ref Range Status   MRSA by PCR POSITIVE (A) NEGATIVE Final    Comment:        The GeneXpert MRSA Assay (FDA approved for NASAL specimens only), is one component of a comprehensive MRSA colonization surveillance program. It is not intended to diagnose MRSA infection nor to guide or monitor treatment for MRSA infections. RESULT CALLED TO, READ BACK BY AND VERIFIED WITH: R MACINTOSH,RN _0  07/20/17 MKELLY      Studies: No results found.  Scheduled Meds: . Chlorhexidine Gluconate Cloth  6 each Topical Q0600  . digoxin  0.0625 mg Oral Daily  . diltiazem  240 mg Oral Daily  . famciclovir  250 mg Oral Daily  . famotidine  20 mg Oral BID  . furosemide  40 mg Intravenous Once  . gabapentin  300 mg Oral BID  . Immune Globulin 10%  400 mg/kg Intravenous Q24 Hr x 5  . insulin aspart  0-5 Units Subcutaneous QHS  . insulin aspart  0-9 Units Subcutaneous TID WC  . insulin detemir  6 Units Subcutaneous QHS  . metoprolol tartrate  100 mg Oral BID  . mupirocin ointment  1 application Nasal BID  . polyvinyl alcohol  2 drop Both Eyes BID  . sodium chloride  1 spray Each Nare TID  . torsemide  20 mg Oral Daily  . traMADol  50 mg Oral TID    Continuous Infusions: . sodium chloride    . sodium chloride    . sodium chloride    . dexamethasone (DECADRON) IVPB [facility] Stopped (07/23/17 1245)  . piperacillin-tazobactam (ZOSYN)  IV Stopped (07/24/17 0837)  . vancomycin Stopped (07/22/17 2306)    Active Problems:   Atrial fibrillation, chronic (HCC)  Chronic diastolic HF (heart failure) (HCC)   Sepsis ([place])   Bleeding from wound   Acute blood loss anemia  Time spent: 30 minutes  [facility] Pager [phone]. If 7PM-7AM, please contact night-coverage at [email or web address], password [facility] 07/24/2017, 8:58 AM  LOS: 4 days

## 2017-07-25 ENCOUNTER — Inpatient Hospital Stay (HOSPITAL_COMMUNITY): Payer: Medicare Other

## 2017-07-25 DIAGNOSIS — S8012XA Contusion of left lower leg, initial encounter: Secondary | ICD-10-CM

## 2017-07-25 LAB — GLUCOSE, CAPILLARY
GLUCOSE-CAPILLARY: 217 mg/dL — AB (ref 65–99)
GLUCOSE-CAPILLARY: 373 mg/dL — AB (ref 65–99)
GLUCOSE-CAPILLARY: 311 mg/dL — AB (ref 65–99)
GLUCOSE-CAPILLARY: 250 mg/dL — AB (ref 65–99)

## 2017-07-25 LAB — BASIC METABOLIC PANEL
Anion gap: 8 (ref 5–15)
BUN: 45 mg/dL — AB (ref 6–20)
CHLORIDE: 102 mmol/L (ref 101–111)
CO2: 24 mmol/L (ref 22–32)
CREATININE: 1.19 mg/dL — AB (ref 0.44–1.00)
Calcium: 8.4 mg/dL — ABNORMAL LOW (ref 8.9–10.3)
GFR calc non Af Amer: 41 mL/min — ABNORMAL LOW (ref >60)
GFR, EST AFRICAN AMERICAN: 47 mL/min — AB (ref >60)
GLUCOSE: 280 mg/dL — AB (ref 65–99)
Potassium: 3.8 mmol/L (ref 3.5–5.1)
Sodium: 134 mmol/L — ABNORMAL LOW (ref 135–145)

## 2017-07-25 LAB — CBC WITH DIFFERENTIAL/PLATELET
Basophils Absolute: 0 10*3/uL (ref 0.0–0.1)
Basophils Relative: 0 %
EOS ABS: 0 10*3/uL (ref 0.0–0.7)
EOS PCT: 0 %
HCT: 30.6 % — ABNORMAL LOW (ref 36.0–46.0)
Hemoglobin: 10.3 g/dL — ABNORMAL LOW (ref 12.0–15.0)
LYMPHS ABS: 0.3 10*3/uL — AB (ref 0.7–4.0)
Lymphocytes Relative: 4 %
MCH: 29.8 pg (ref 26.0–34.0)
MCHC: 33.7 g/dL (ref 30.0–36.0)
MCV: 88.4 fL (ref 78.0–100.0)
MONO ABS: 0.4 10*3/uL (ref 0.1–1.0)
MONOS PCT: 5 %
Neutro Abs: 8.6 10*3/uL — ABNORMAL HIGH (ref 1.7–7.7)
Neutrophils Relative %: 91 %
PLATELETS: 417 10*3/uL — AB (ref 150–400)
RBC: 3.46 MIL/uL — ABNORMAL LOW (ref 3.87–5.11)
RDW: 16.4 % — AB (ref 11.5–15.5)
WBC: 9.4 10*3/uL (ref 4.0–10.5)

## 2017-07-25 LAB — APTT: APTT: 27 s (ref 24–36)

## 2017-07-25 LAB — PROTIME-INR
INR: 1.21
Prothrombin Time: 15.2 seconds (ref 11.4–15.2)

## 2017-07-25 MED ORDER — FUROSEMIDE 10 MG/ML IJ SOLN
40.0000 mg | Freq: Once | INTRAMUSCULAR | Status: AC
  Administered 2017-07-25: 40 mg via INTRAVENOUS
  Filled 2017-07-25: qty 4

## 2017-07-25 MED ORDER — AMOXICILLIN-POT CLAVULANATE 875-125 MG PO TABS
1.0000 | ORAL_TABLET | Freq: Two times a day (BID) | ORAL | Status: DC
  Administered 2017-07-25 – 2017-07-29 (×8): 1 via ORAL
  Filled 2017-07-25 (×8): qty 1

## 2017-07-25 NOTE — Progress Notes (Signed)
Patient's B/P was 166/63.  PCP on call was notified

## 2017-07-25 NOTE — Progress Notes (Signed)
Patient wanted to phone her son, [name] [name], but that call is long distance. RN phoned the patient's son,Allen, but there was no answer and the voicemail had not been set up for RN to leave a message. RN will inform the patient.

## 2017-07-25 NOTE — Care Management Note (Signed)
Case Management Note  Patient Details  Name: CALEIGH RABELO MRN: [id number] Date of Birth: 01/31/1931  Subjective/Objective:    Pt with hx of ITP, A.Fib, DVT on anticoagulate, when sent from [facility] for rapid enlarging leg wound. Pt was treated with Decadon and IVIG/Oncology serve MD.                Action/Plan: Plan to discharge back to SNF in AM   Expected Discharge Date:                  Expected Discharge Plan:  [place]  In-House Referral:  Clinical Social Work  Discharge planning Services  CM Consult  Post Acute Care Choice:    Choice offered to:     DME Arranged:    DME Agency:     HH Arranged:    [place] Agency:     Status of Service:  Completed, signed off  If discussed at [employer], dates discussed:    Additional CommentsPurcell Mouton, RN 07/25/2017, 2:23 PM

## 2017-07-25 NOTE — Progress Notes (Signed)
[name] [name] is improving slowly.  Her platelet count is fantastic.  Her platelet count today is 417,000.  She is still taking the low-dose IVIG.  She has been on Decadron.  She completed this yesterday.  Her left leg is still wrapped.  Looks like some the areas of ecchymoses on her arms and body are improving.  Her hemoglobin was 10.3.  I think she was transfused.  The transfusion I think also helps to some degree with coagulopathy.  She seems to be eating okay.  She has had no nausea or vomiting.  There is no cough or shortness of breath.  She has had no problems with congestive heart failure.  Seems like pain might be doing a little bit better.  Her physical exam shows vital signs a temperature of 98.2.  Pulse 66.  Blood pressure 166/63.  Her lungs sound relatively clear.  Cardiac exam regular rate and rhythm consistent with atrial fibrillation.  Abdomen is soft.  She is somewhat obese.  Extremities shows the wrapping in the left lower leg.  Right leg does have some stasis dermatitis changes.  Neurological exam shows no focal neurological deficits.  Ms. Burgener is off Lovenox.  She needs to be off blood thinners.  Again, she realizes that on blood thinners, she can have more issues with bleeding.  She realizes that off blood thinners, she is at risk for cerebrovascular issues.  Hopefully, she will continue to improve.  I will think that it may take a while for the site of bleeding to really heal up.  Lattie Haw, MD

## 2017-07-25 NOTE — Progress Notes (Addendum)
[facility] PROGRESS NOTE  HASEL JANISH YYT:[id number] DOB: 1931/09/12 DOA: 07/20/2017   PCP: Lajean Manes, MD  Interim summary and HPI 83 y.o. female With history of ITP, A. fib, DVT on anticoagulation, is sent from nursing home to emergency room due to rapid enlarging leg wound. Patient with active bleeding and rapid hemodynamically compromising.  Assessment/Plan: 1-LLE with large bleeding wound - with concerns for reaction from lovenox - overall improving  - cont wound care  - continue prophylactic abx, decadron - stop Zosyn and change to Augmentin   2-ITP - continue decadron and IVIG as instructed by oncology service - no need for transfusion at this time - ok to stop IVIG per Dr. Jonette Eva  - CBC in AM  3-right upper extremity non-occlusive DVT and hx of A. Fib - CHADsVASC 3 - was on chronic lovenox - stopped AC due to bleeding   4-chronic paroxysmal A. Fib - cont digoxin, cardizem and lopressor  - rate controlled  - no AC due to active bleeding   5-chronic diastolic HF - compensated  - monitor weights Filed Weights   07/20/17 1844 07/20/17 2000  Weight: 84.3 kg (185 lb 13.6 oz) 84.3 kg (185 lb 13.6 oz)  - no dyspnea this AM   6-AKI on CKD: stage 3 at baseline - Cr slightly up this AM  - will repeat BMP in AM  7-insulin dependent diabetes with renal dysfunction - cont SSI   8-acute blood loss anemia - from blood loss out of LLE wound - improved with transfusion - so far has received 6 units of PRBC, 2 units of FFP and 1 unit of platelets - check Hg in AM and if stable, can plan d/c in AM  Code Status: DNR Family Communication: no family at bedside  Disposition Plan: SNF in AM if Hg stable  Consultants:  CCS  Oncology   Procedures:  See below for x-ray reports   Antibiotics:  Zosyn, change to Augmentin 11/08 -->  HPI/Subjective: Pt denies chest pain or dyspnea.   Objective: Vitals:   07/24/17 2143 07/25/17 0532  BP: (!) 169/71  (!) 166/63  Pulse: 67 66  Resp: 20 20  Temp: 98.7 F (37.1 C) 98.2 F (36.8 C)  SpO2: 93% 94%    Intake/Output Summary (Last 24 hours) at 07/25/2017 0849 Last data filed at 07/25/2017 0525 Gross per 24 hour  Intake 680 ml  Output 2050 ml  Net -1370 ml   Filed Weights   07/20/17 1844 07/20/17 2000  Weight: 84.3 kg (185 lb 13.6 oz) 84.3 kg (185 lb 13.6 oz)   Physical Exam  Constitutional: Appears calm, NAD CVS: RRR, S1/S2 +, no murmurs, no gallops, no carotid bruit.  Pulmonary: Effort and breath sounds normal, no stridor, rhonchi, wheezes, rales.  Abdominal: Soft. BS +,  no distension, tenderness, rebound or guarding.  Neuro: Alert. Normal reflexes, muscle tone coordination. No cranial nerve deficit. Skin: Multiple ecchymosis throughout upper and lower extremities but overall improving,left lower extremity wound with compressive dressing   Psychiatric: Normal mood and affect. Behavior, judgment, thought content normal.   Data Reviewed: Basic Metabolic Panel: Recent Labs  Lab 07/20/17 1221 07/20/17 1247 07/21/17 0331 07/22/17 0358 07/23/17 0503 07/24/17 0448 07/25/17 0444  NA 136 139 141 141 138  --  134*  K 4.3 4.2 3.9 4.0 3.8  --  3.8  CL 103 102 107 105 105  --  102  CO2 24  --  _0 --  24  GLUCOSE 249* 247* 186* 212* 165*  --  280*  BUN 45* 42* 49* 54* 46*  --  45*  CREATININE 1.16* 1.30* 1.20* 1.54* 1.27* 1.14* 1.19*  CALCIUM 8.6*  --  8.0* 8.2* 8.2*  --  8.4*   Liver Function Tests: Recent Labs  Lab 07/20/17 1221  AST 17  ALT 18  ALKPHOS 69  BILITOT 0.9  PROT 6.1*  ALBUMIN 3.1*   CBC: Recent Labs  Lab 07/21/17 1443 07/22/17 0358 07/23/17 0503 07/24/17 0448 07/25/17 0444  WBC 21.1* 8.8 9.6 7.8 9.4  NEUTROABS 20.6* 7.7 8.1* 6.9 8.6*  HGB 6.7* 9.3* 8.1* 10.1* 10.3*  HCT 20.3* 27.5* 24.1* 30.5* 30.6*  MCV 86.8 87.3 88.9 87.9 88.4  PLT 195 164 254 282 417*   BNP (last 3 results) Recent Labs    11/17/16 1500 01/18/17 1524 06/26/17 1308   BNP 1,492.9* 428.7* 704.4*    CBG: Recent Labs  Lab 07/24/17 0809 07/24/17 1159 07/24/17 1635 07/24/17 2143 07/25/17 0749  GLUCAP 217* 258* 239* 340* 217*    Recent Results (from the past 240 hour(s))  Culture, blood (routine x 2)     Status: None (Preliminary result)   Collection Time: 07/20/17  7:55 PM  Result Value Ref Range Status   Specimen Description BLOOD BLOOD LEFT FOREARM  Final   Special Requests   Final    BOTTLES DRAWN AEROBIC ONLY Blood Culture adequate volume   Culture   Final    NO GROWTH 3 DAYS Performed at [facility], [address] [place], [place] [address]    Report Status PENDING  Incomplete  Culture, blood (routine x 2)     Status: None (Preliminary result)   Collection Time: 07/20/17  7:55 PM  Result Value Ref Range Status   Specimen Description BLOOD LEFT ANTECUBITAL  Final   Special Requests   Final    BOTTLES DRAWN AEROBIC ONLY Blood Culture adequate volume   Culture   Final    NO GROWTH 3 DAYS Performed at [facility], [place] [address], [place], [place] [address]    Report Status PENDING  Incomplete  MRSA PCR Screening     Status: Abnormal   Collection Time: 07/20/17  8:00 PM  Result Value Ref Range Status   MRSA by PCR POSITIVE (A) NEGATIVE Final    Comment:        The GeneXpert MRSA Assay (FDA approved for NASAL specimens only), is one component of a comprehensive MRSA colonization surveillance program. It is not intended to diagnose MRSA infection nor to guide or monitor treatment for MRSA infections. RESULT CALLED TO, READ BACK BY AND VERIFIED WITH: R MACINTOSH,RN _0  07/20/17 MKELLY      Studies: No results found.  Scheduled Meds: . digoxin  0.0625 mg Oral Daily  . diltiazem  240 mg Oral Daily  . famciclovir  250 mg Oral Daily  . famotidine  20 mg Oral BID  . gabapentin  300 mg Oral BID  . Immune Globulin 10%  400 mg/kg Intravenous Q24 Hr x 5  . insulin aspart  0-5 Units Subcutaneous QHS  . insulin  aspart  0-9 Units Subcutaneous TID WC  . insulin detemir  6 Units Subcutaneous QHS  . metoprolol tartrate  100 mg Oral BID  . mupirocin ointment  1 application Nasal BID  . polyvinyl alcohol  2 drop Both Eyes BID  . sodium chloride  1 spray Each Nare TID  . torsemide  20 mg  Oral Daily  . traMADol  50 mg Oral TID   Continuous Infusions: . sodium chloride    . sodium chloride    . sodium chloride    . piperacillin-tazobactam (ZOSYN)  IV 3.375 g (07/25/17 0450)   Time spent: 25 minutes  [facility] Pager [phone]. If 7PM-7AM, please contact night-coverage at [email or web address], password [facility] 07/25/2017, 8:49 AM  LOS: 5 days

## 2017-07-26 ENCOUNTER — Ambulatory Visit: Payer: Medicare Other

## 2017-07-26 ENCOUNTER — Ambulatory Visit: Payer: Medicare Other | Admitting: Family

## 2017-07-26 ENCOUNTER — Other Ambulatory Visit: Payer: Medicare Other

## 2017-07-26 LAB — GLUCOSE, CAPILLARY
GLUCOSE-CAPILLARY: 129 mg/dL — AB (ref 65–99)
Glucose-Capillary: 98 mg/dL (ref 65–99)
Glucose-Capillary: 211 mg/dL — ABNORMAL HIGH (ref 65–99)

## 2017-07-26 LAB — BASIC METABOLIC PANEL
Anion gap: 8 (ref 5–15)
BUN: 40 mg/dL — AB (ref 6–20)
CHLORIDE: 100 mmol/L — AB (ref 101–111)
CO2: 28 mmol/L (ref 22–32)
CREATININE: 1.01 mg/dL — AB (ref 0.44–1.00)
Calcium: 8.1 mg/dL — ABNORMAL LOW (ref 8.9–10.3)
GFR calc Af Amer: 58 mL/min — ABNORMAL LOW (ref >60)
GFR, EST NON AFRICAN AMERICAN: 50 mL/min — AB (ref >60)
Glucose, Bld: 142 mg/dL — ABNORMAL HIGH (ref 65–99)
Potassium: 3.1 mmol/L — ABNORMAL LOW (ref 3.5–5.1)
SODIUM: 136 mmol/L (ref 135–145)

## 2017-07-26 LAB — CULTURE, BLOOD (ROUTINE X 2)
CULTURE: NO GROWTH
Culture: NO GROWTH
SPECIAL REQUESTS: ADEQUATE
SPECIAL REQUESTS: ADEQUATE

## 2017-07-26 LAB — APTT: APTT: 27 s (ref 24–36)

## 2017-07-26 LAB — CBC WITH DIFFERENTIAL/PLATELET
BASOS ABS: 0 10*3/uL (ref 0.0–0.1)
Basophils Relative: 0 %
EOS ABS: 0 10*3/uL (ref 0.0–0.7)
EOS PCT: 0 %
HCT: 31.1 % — ABNORMAL LOW (ref 36.0–46.0)
Hemoglobin: 10.4 g/dL — ABNORMAL LOW (ref 12.0–15.0)
Lymphocytes Relative: 7 %
Lymphs Abs: 0.9 10*3/uL (ref 0.7–4.0)
MCH: 29.9 pg (ref 26.0–34.0)
MCHC: 33.4 g/dL (ref 30.0–36.0)
MCV: 89.4 fL (ref 78.0–100.0)
Monocytes Absolute: 1.2 10*3/uL — ABNORMAL HIGH (ref 0.1–1.0)
Monocytes Relative: 9 %
Neutro Abs: 10.4 10*3/uL — ABNORMAL HIGH (ref 1.7–7.7)
Neutrophils Relative %: 84 %
PLATELETS: 452 10*3/uL — AB (ref 150–400)
RBC: 3.48 MIL/uL — AB (ref 3.87–5.11)
RDW: 16.6 % — ABNORMAL HIGH (ref 11.5–15.5)
WBC: 12.5 10*3/uL — AB (ref 4.0–10.5)

## 2017-07-26 LAB — PROTIME-INR
INR: 1.26
Prothrombin Time: 15.7 seconds — ABNORMAL HIGH (ref 11.4–15.2)

## 2017-07-26 MED ORDER — DOXYCYCLINE HYCLATE 100 MG PO TABS
100.0000 mg | ORAL_TABLET | Freq: Two times a day (BID) | ORAL | Status: DC
  Administered 2017-07-26 – 2017-07-29 (×7): 100 mg via ORAL
  Filled 2017-07-26 (×7): qty 1

## 2017-07-26 MED ORDER — POTASSIUM CHLORIDE CRYS ER 20 MEQ PO TBCR
40.0000 meq | EXTENDED_RELEASE_TABLET | Freq: Once | ORAL | Status: AC
  Administered 2017-07-26: 40 meq via ORAL
  Filled 2017-07-26: qty 2

## 2017-07-26 MED ORDER — INSULIN ASPART 100 UNIT/ML ~~LOC~~ SOLN
0.0000 [IU] | Freq: Three times a day (TID) | SUBCUTANEOUS | Status: DC
  Administered 2017-07-26: 5 [IU] via SUBCUTANEOUS
  Administered 2017-07-27: 2 [IU] via SUBCUTANEOUS
  Administered 2017-07-27: 3 [IU] via SUBCUTANEOUS
  Administered 2017-07-28: 2 [IU] via SUBCUTANEOUS
  Administered 2017-07-28: 5 [IU] via SUBCUTANEOUS

## 2017-07-26 MED ORDER — FUROSEMIDE 10 MG/ML IJ SOLN
40.0000 mg | Freq: Every day | INTRAMUSCULAR | Status: DC
  Administered 2017-07-26 – 2017-07-29 (×4): 40 mg via INTRAVENOUS
  Filled 2017-07-26 (×4): qty 4

## 2017-07-26 NOTE — Progress Notes (Signed)
[facility] PROGRESS NOTE  [name] [name] XHB:[id number] DOB: Dec 30, 1930 DOA: 07/20/2017   PCP: Lajean Manes, MD  Interim summary and HPI 83 y.o. female With history of ITP, A. fib, DVT on anticoagulation, is sent from nursing home to emergency room due to rapid enlarging leg wound. Patient with active bleeding and rapid hemodynamically compromising.  Assessment/Plan: 1-LLE with large bleeding wound - with concerns for reaction from lovenox - overall improving  - cont wound care  - continue prophylactic abx, decadron - stop Zosyn and change to Augmentin on 11/08  2-ITP - continue decadron and IVIG as instructed by oncology service - no need for transfusion at this time - IVIG stopped 11/08  - CBC in AM  3-right upper extremity non-occlusive DVT and hx of A. Fib - CHADsVASC 3 - was on chronic lovenox - no AC due to bleeding   4-chronic paroxysmal A. Fib - cont digoxin, cardizem and lopressor  - rate controlled  - no AC due to active bleeding   5-acute resp failure due to acute on chronic diastolic CHF - worse resp status yesterday evening, started on lasix IV - will monitor daily weights, I/O - CT chest with likely pulmonary vasc congestion but atypical PNA can not be ruled out  - added doxycycline to Augmentin for atypical coverage  Filed Weights   07/20/17 1844 07/20/17 2000 07/26/17 0413  Weight: 84.3 kg (185 lb 13.6 oz) 84.3 kg (185 lb 13.6 oz) 85.9 kg (189 lb 6 oz)    6-AKI on CKD: stage 3 at baseline, hypokalemia  - Cr trending down - supplement K - BMP in AM  7-insulin dependent diabetes with renal dysfunction - cont SSI   8-acute blood loss anemia - from blood loss out of LLE wound - improved with transfusion - so far has received 6 units of PRBC, 2 units of FFP and 1 unit of platelets - CBC in AM  Code Status: DNR Family Communication: no family at bedside  Disposition Plan: SNF in AM if Hg stable  Consultants:  CCS  Oncology    Procedures:  See below for x-ray reports   Antibiotics:  Zosyn, change to Augmentin 11/08 -->  HPI/Subjective: Pt reports feeling tired this AM.  Objective: Vitals:   07/25/17 2058 07/26/17 0413  BP: (!) 154/52 (!) 156/56  Pulse: 60 62  Resp: 20 20  Temp: 97.7 F (36.5 C) 97.7 F (36.5 C)  SpO2: 97% 97%    Intake/Output Summary (Last 24 hours) at 07/26/2017 0851 Last data filed at 07/26/2017 0430 Gross per 24 hour  Intake 2954.5 ml  Output 5600 ml  Net -2645.5 ml   Filed Weights   07/20/17 1844 07/20/17 2000 07/26/17 0413  Weight: 84.3 kg (185 lb 13.6 oz) 84.3 kg (185 lb 13.6 oz) 85.9 kg (189 lb 6 oz)   Physical Exam  Constitutional: Appears calm, tired and chronically ill, NAD CVS: RRR, S1/S2 +, no murmurs, no gallops, no carotid bruit.  Pulmonary: bilateral rhonchi and diminished sounds at bases  Abdominal: Soft. BS +,  no distension, tenderness, rebound or guarding.  Neuro: Alert. Normal reflexes, muscle tone coordination. No cranial nerve deficit.  Data Reviewed: Basic Metabolic Panel: Recent Labs  Lab 07/21/17 0331 07/22/17 0358 07/23/17 0503 07/24/17 0448 07/25/17 0444 07/26/17 0525  NA 141 141 138  --  134* 136  K 3.9 4.0 3.8  --  3.8 3.1*  CL 107 105 105  --  102 100*  CO2 _0 --  24 28  GLUCOSE 186* 212* 165*  --  280* 142*  BUN 49* 54* 46*  --  45* 40*  CREATININE 1.20* 1.54* 1.27* 1.14* 1.19* 1.01*  CALCIUM 8.0* 8.2* 8.2*  --  8.4* 8.1*   Liver Function Tests: Recent Labs  Lab 07/20/17 1221  AST 17  ALT 18  ALKPHOS 69  BILITOT 0.9  PROT 6.1*  ALBUMIN 3.1*   CBC: Recent Labs  Lab 07/22/17 0358 07/23/17 0503 07/24/17 0448 07/25/17 0444 07/26/17 0525  WBC 8.8 9.6 7.8 9.4 12.5*  NEUTROABS 7.7 8.1* 6.9 8.6* 10.4*  HGB 9.3* 8.1* 10.1* 10.3* 10.4*  HCT 27.5* 24.1* 30.5* 30.6* 31.1*  MCV 87.3 88.9 87.9 88.4 89.4  PLT 164 254 282 417* 452*   BNP (last 3 results) Recent Labs    11/17/16 1500 01/18/17 1524  06/26/17 1308  BNP 1,492.9* 428.7* 704.4*    CBG: Recent Labs  Lab 07/25/17 0749 07/25/17 1117 07/25/17 1629 07/25/17 2057 07/26/17 0814  GLUCAP 217* 373* 311* 250* 129*    Recent Results (from the past 240 hour(s))  Culture, blood (routine x 2)     Status: None (Preliminary result)   Collection Time: 07/20/17  7:55 PM  Result Value Ref Range Status   Specimen Description BLOOD BLOOD LEFT FOREARM  Final   Special Requests   Final    BOTTLES DRAWN AEROBIC ONLY Blood Culture adequate volume   Culture   Final    NO GROWTH 4 DAYS Performed at [facility], [place] [address], [place], [place] [address]    Report Status PENDING  Incomplete  Culture, blood (routine x 2)     Status: None (Preliminary result)   Collection Time: 07/20/17  7:55 PM  Result Value Ref Range Status   Specimen Description BLOOD LEFT ANTECUBITAL  Final   Special Requests   Final    BOTTLES DRAWN AEROBIC ONLY Blood Culture adequate volume   Culture   Final    NO GROWTH 4 DAYS Performed at [facility], [place] [address] [place],  [address]    Report Status PENDING  Incomplete  MRSA PCR Screening     Status: Abnormal   Collection Time: 07/20/17  8:00 PM  Result Value Ref Range Status   MRSA by PCR POSITIVE (A) NEGATIVE Final    Comment:        The GeneXpert MRSA Assay (FDA approved for NASAL specimens only), is one component of a comprehensive MRSA colonization surveillance program. It is not intended to diagnose MRSA infection nor to guide or monitor treatment for MRSA infections. RESULT CALLED TO, READ BACK BY AND VERIFIED WITH: R MACINTOSH,RN _0  07/20/17 MKELLY      Studies: Ct Chest Wo Contrast  Result Date: 07/25/2017 CLINICAL DATA:  Chronic dyspnea and rales. EXAM: CT CHEST WITHOUT CONTRAST TECHNIQUE: Multidetector CT imaging of the chest was performed following the standard protocol without IV contrast. COMPARISON:  06/26/2017 CT FINDINGS: Cardiovascular: Stable  cardiomegaly without pericardial effusion. There is mild aortic atherosclerosis without aneurysm. Three-vessel coronary arteriosclerosis is noted. Mediastinum/Nodes: No enlarged mediastinal or axillary lymph nodes. Thyroid gland, trachea, and esophagus demonstrate no significant findings. Lungs/Pleura: No pleural effusion. Extensive ground-glass attenuation upper lobe predominant with new subsegmental areas of patchy pulmonary consolidation left greater than right. Upper Abdomen: No acute abnormality. Musculoskeletal: Spinal stimulator is again noted. Marked glenohumeral joint osteoarthritis with remodeled appearance of the glenoids. Thoracic spondylosis. IMPRESSION: 1. Stable cardiomegaly with aortic atherosclerosis and coronary arteriosclerosis. 2. Upper  lobe predominant ground-glass opacities, nonspecific but can be seen with alveolitis/pneumonitis, atypical pneumonia, stigmata of pulmonary edema and/or hypoventilatory change. Given cardiomegaly, suspect stigmata of pulmonary edema. 3. New patchy subsegmental pulmonary consolidations, suspect atelectasis. Aortic Atherosclerosis (ICD10-I70.0). Electronically Signed   By: Ashley Royalty M.D.   On: 07/25/2017 23:12   Dg Chest Port 1 View  Result Date: 07/25/2017 CLINICAL DATA:  Dyspnea, cough, and chest congestion. EXAM: PORTABLE CHEST 1 VIEW COMPARISON:  Chest x-ray and chest CT scan of June 26, 2017 FINDINGS: The lungs are mildly hypoinflated. The interstitial markings are increased but much improved over the previous study. The cardiac silhouette remains enlarged. The pulmonary vascularity is not engorged. There is tortuosity of the descending thoracic aorta. A nerve stimulator electrode projects over the midthoracic spine. There degenerative changes of both shoulders. IMPRESSION: Low-grade compensated CHF.  No alveolar pneumonia. Electronically Signed   By: David  Martinique M.D.   On: 07/25/2017 16:23    Scheduled Meds: . amoxicillin-clavulanate  1 tablet  Oral Q12H  . digoxin  0.0625 mg Oral Daily  . diltiazem  240 mg Oral Daily  . famciclovir  250 mg Oral Daily  . famotidine  20 mg Oral BID  . furosemide  40 mg Intravenous Daily  . gabapentin  300 mg Oral BID  . insulin aspart  0-5 Units Subcutaneous QHS  . insulin aspart  0-9 Units Subcutaneous TID WC  . insulin detemir  6 Units Subcutaneous QHS  . metoprolol tartrate  100 mg Oral BID  . polyvinyl alcohol  2 drop Both Eyes BID  . sodium chloride  1 spray Each Nare TID  . torsemide  20 mg Oral Daily  . traMADol  50 mg Oral TID   Continuous Infusions: . sodium chloride    . sodium chloride    . sodium chloride     Time spent: 25 minutes  [facility] Pager [phone]. If 7PM-7AM, please contact night-coverage at [email or web address], password [facility] 07/26/2017, 8:51 AM  LOS: 6 days

## 2017-07-26 NOTE — Progress Notes (Signed)
[name] [name] seems to be doing pretty well.  She has no specific complaints.  Her hemoglobin is 10.4.  Platelet count 452,000.  Her potassium is a little bit low.  She is not complaining of pain.  I am unsure how much activity she is really doing.  She is eating okay.  She has had no nausea or vomiting.  There is no bleeding.  Her left lower leg is still wrapped.  Her vital signs are all pretty stable.  Her blood pressure is 156/56.  Pulse is 62.  Head neck exam shows no ocular or oral lesions.  There is no adenopathy.  Lungs are clear.  Cardiac exam irregular rate and rhythm consistent with atrial fibrillation.  The rate is well controlled.  Abdomen is soft.  Extremities shows the wrapping in the left lower leg.  She does have ecchymosis that are scattered.  They seem to be resolving.  Hopefully, Ms. Andreas will be able to go back to the skilled nursing facility.  Her platelet count has come up quite nicely.  I am sure will drop back down.  Hopefully, the IVIG that she has been given will keep her platelet count up a little bit.  I appreciate all the great care that she is getting from the staff on [address].  Lattie Haw, MD  Oswaldo Milian 11:2

## 2017-07-26 NOTE — Progress Notes (Signed)
OT Cancellation Note  Patient Details Name: [name] [name] MRN: [id number] DOB: October 05, 1930   Cancelled Treatment:    Reason Eval/Treat Not Completed: OT screened, no needs identified, will sign off  screened, no needs identified, will sign off;   Pt long term resident of [facility] SNF--total care at baseline, will defer any further needs to SNF setting     Belva, Thereasa Parkin 07/26/2017, 11:47 AM

## 2017-07-26 NOTE — Final Consult Note (Signed)
Consultant Final Sign-Off Note    Assessment/Final recommendations  [name] [name] is a 83 y.o. female followed by me for LLE wound 2/2 ruptured hemorrhagic bullae   Wound care (if applicable): change dressing daily and place xeroform gauze on the wound, then abd pad, wrap with kerlex and ace.   Diet at discharge: per primary team   Activity at discharge: per primary team   Follow-up appointment:  Wound care clinic is recommended   Pending results:  Unresulted Labs (From admission, onward)   Start     Ordered   07/22/17 0500  CBC with Differential/Platelet  Daily,   R    Question:  Specimen collection method  Answer:  Lab=Lab collect   07/21/17 0830   07/22/17 0500  APTT  Daily,   R    Question:  Specimen collection method  Answer:  Lab=Lab collect   07/21/17 0830       Medication recommendations:   Other recommendations:    Thank you for allowing [place] to participate in the care of your patient!  Please consult [place] again if you have further needs for your patient.  Kalman Drape 07/26/2017 3:51 PM    Subjective   CC; mild leg pain  No new complaints. Resting comfortably in bed reading a book. Tolerated dressing change well.  Objective  Vital signs in last 24 hours: Temp:  [97.7 F (36.5 C)-98 F (36.7 C)] 98 F (36.7 C) (11/09 1407) Pulse Rate:  [60-95] 95 (11/09 1407) Resp:  [20] 20 (11/09 1407) BP: (133-156)/(52-68) 133/68 (11/09 1407) SpO2:  [97 %] 97 % (11/09 1407) Weight:  [189 lb 6 oz (85.9 kg)] 189 lb 6 oz (85.9 kg) (11/09 0413)  PE:  General: alert, NAD, pleasant, cooperative Lungs: rate and effort normal Extremities: wound to LLE with large flap of skin, no active bleeding, no purulent drainage. See photos below.         Pertinent labs and Studies: Recent Labs    07/24/17 0448 07/25/17 0444 07/26/17 0525  WBC 7.8 9.4 12.5*  HGB 10.1* 10.3* 10.4*  HCT 30.5* 30.6* 31.1*   BMET Recent Labs    07/25/17 0444 07/26/17 0525  NA  134* 136  K 3.8 3.1*  CL 102 100*  CO2 24 28  GLUCOSE 280* 142*  BUN 45* 40*  CREATININE 1.19* 1.01*  CALCIUM 8.4* 8.1*   No results for input(s): LABURIN in the last 72 hours. Results for orders placed or performed during the hospital encounter of 07/20/17  Culture, blood (routine x 2)     Status: None   Collection Time: 07/20/17  7:55 PM  Result Value Ref Range Status   Specimen Description BLOOD BLOOD LEFT FOREARM  Final   Special Requests   Final    BOTTLES DRAWN AEROBIC ONLY Blood Culture adequate volume   Culture   Final    NO GROWTH 5 DAYS Performed at [facility], [place] [address] [place], [place] [address]    Report Status 07/26/2017 FINAL  Final  Culture, blood (routine x 2)     Status: None   Collection Time: 07/20/17  7:55 PM  Result Value Ref Range Status   Specimen Description BLOOD LEFT ANTECUBITAL  Final   Special Requests   Final    BOTTLES DRAWN AEROBIC ONLY Blood Culture adequate volume   Culture   Final    NO GROWTH 5 DAYS Performed at [facility], [place] [address], [place], [place] [address]  Report Status 07/26/2017 FINAL  Final  MRSA PCR Screening     Status: Abnormal   Collection Time: 07/20/17  8:00 PM  Result Value Ref Range Status   MRSA by PCR POSITIVE (A) NEGATIVE Final    Comment:        The GeneXpert MRSA Assay (FDA approved for NASAL specimens only), is one component of a comprehensive MRSA colonization surveillance program. It is not intended to diagnose MRSA infection nor to guide or monitor treatment for MRSA infections. RESULT CALLED TO, READ BACK BY AND VERIFIED WITH: R MACINTOSH,RN _0  07/20/17 MKELLY     Imaging: Ct Chest Wo Contrast  Result Date: 07/25/2017 CLINICAL DATA:  Chronic dyspnea and rales. EXAM: CT CHEST WITHOUT CONTRAST TECHNIQUE: Multidetector CT imaging of the chest was performed following the standard protocol without IV contrast. COMPARISON:  06/26/2017 CT FINDINGS: Cardiovascular: Stable  cardiomegaly without pericardial effusion. There is mild aortic atherosclerosis without aneurysm. Three-vessel coronary arteriosclerosis is noted. Mediastinum/Nodes: No enlarged mediastinal or axillary lymph nodes. Thyroid gland, trachea, and esophagus demonstrate no significant findings. Lungs/Pleura: No pleural effusion. Extensive ground-glass attenuation upper lobe predominant with new subsegmental areas of patchy pulmonary consolidation left greater than right. Upper Abdomen: No acute abnormality. Musculoskeletal: Spinal stimulator is again noted. Marked glenohumeral joint osteoarthritis with remodeled appearance of the glenoids. Thoracic spondylosis. IMPRESSION: 1. Stable cardiomegaly with aortic atherosclerosis and coronary arteriosclerosis. 2. Upper lobe predominant ground-glass opacities, nonspecific but can be seen with alveolitis/pneumonitis, atypical pneumonia, stigmata of pulmonary edema and/or hypoventilatory change. Given cardiomegaly, suspect stigmata of pulmonary edema. 3. New patchy subsegmental pulmonary consolidations, suspect atelectasis. Aortic Atherosclerosis (ICD10-I70.0). Electronically Signed   By: Ashley Royalty M.D.   On: 07/25/2017 23:12   Dg Chest Port 1 View  Result Date: 07/25/2017 CLINICAL DATA:  Dyspnea, cough, and chest congestion. EXAM: PORTABLE CHEST 1 VIEW COMPARISON:  Chest x-ray and chest CT scan of June 26, 2017 FINDINGS: The lungs are mildly hypoinflated. The interstitial markings are increased but much improved over the previous study. The cardiac silhouette remains enlarged. The pulmonary vascularity is not engorged. There is tortuosity of the descending thoracic aorta. A nerve stimulator electrode projects over the midthoracic spine. There degenerative changes of both shoulders. IMPRESSION: Low-grade compensated CHF.  No alveolar pneumonia. Electronically Signed   By: David  Martinique M.D.   On: 07/25/2017 16:23     Jackson Latino, [facility] Pager  [phone]

## 2017-07-26 NOTE — Progress Notes (Signed)
PT Cancellation Note  Patient Details Name: [name] [name] MRN: [id number] DOB: 02/26/1931   Cancelled Treatment:    Reason Eval/Treat Not Completed: PT screened, no needs identified, will sign off;   Pt long term resident of [facility] SNF--total care at baseline, will defer any further needs to SNF setting   [facility] 07/26/2017, 8:20 AM

## 2017-07-27 LAB — BASIC METABOLIC PANEL
Anion gap: 7 (ref 5–15)
BUN: 36 mg/dL — AB (ref 6–20)
CALCIUM: 8.1 mg/dL — AB (ref 8.9–10.3)
CHLORIDE: 98 mmol/L — AB (ref 101–111)
CO2: 32 mmol/L (ref 22–32)
CREATININE: 1.08 mg/dL — AB (ref 0.44–1.00)
GFR calc non Af Amer: 46 mL/min — ABNORMAL LOW (ref >60)
GFR, EST AFRICAN AMERICAN: 53 mL/min — AB (ref >60)
GLUCOSE: 134 mg/dL — AB (ref 65–99)
Potassium: 3.6 mmol/L (ref 3.5–5.1)
Sodium: 137 mmol/L (ref 135–145)

## 2017-07-27 LAB — GLUCOSE, CAPILLARY
GLUCOSE-CAPILLARY: 121 mg/dL — AB (ref 65–99)
GLUCOSE-CAPILLARY: 190 mg/dL — AB (ref 65–99)
GLUCOSE-CAPILLARY: 149 mg/dL — AB (ref 65–99)
Glucose-Capillary: 245 mg/dL — ABNORMAL HIGH (ref 65–99)
Glucose-Capillary: 109 mg/dL — ABNORMAL HIGH (ref 65–99)

## 2017-07-27 LAB — CBC WITH DIFFERENTIAL/PLATELET
BASOS ABS: 0 10*3/uL (ref 0.0–0.1)
Basophils Relative: 0 %
Eosinophils Absolute: 0.2 10*3/uL (ref 0.0–0.7)
Eosinophils Relative: 1 %
HEMATOCRIT: 34.7 % — AB (ref 36.0–46.0)
Hemoglobin: 11.1 g/dL — ABNORMAL LOW (ref 12.0–15.0)
LYMPHS PCT: 8 %
Lymphs Abs: 1.5 10*3/uL (ref 0.7–4.0)
MCH: 29.4 pg (ref 26.0–34.0)
MCHC: 32 g/dL (ref 30.0–36.0)
MCV: 92 fL (ref 78.0–100.0)
MONO ABS: 1.4 10*3/uL — AB (ref 0.1–1.0)
Monocytes Relative: 8 %
Neutro Abs: 14.5 10*3/uL — ABNORMAL HIGH (ref 1.7–7.7)
Neutrophils Relative %: 83 %
Platelets: 472 10*3/uL — ABNORMAL HIGH (ref 150–400)
RBC: 3.77 MIL/uL — ABNORMAL LOW (ref 3.87–5.11)
RDW: 17.2 % — AB (ref 11.5–15.5)
WBC: 17.6 10*3/uL — AB (ref 4.0–10.5)

## 2017-07-27 LAB — APTT: aPTT: 29 seconds (ref 24–36)

## 2017-07-27 NOTE — Progress Notes (Signed)
[facility] PROGRESS NOTE  [name] [name] WER:[id number] DOB: March 05, 1931 DOA: 07/20/2017   PCP: Lajean Manes, MD  Interim summary and HPI 83 y.o. female With history of ITP, A. fib, DVT on anticoagulation, is sent from nursing home to emergency room due to rapid enlarging leg wound. Patient with active bleeding and rapid hemodynamically compromising.  Assessment/Plan: 1-LLE with large bleeding wound - with concerns for reaction from lovenox - improving but pt still somewhat lethargic and physically not as well  - cont wound care  - continue prophylactic abx, decadron - WBC still elevated, will repeat CBC in AM - ABX have been changed to Augmentin and doxycycline   2-ITP - no need for transfusion at this time - IVIG stopped 11/08  - CBC in AM  3-right upper extremity non-occlusive DVT and hx of A. Fib - CHADsVASC 3 - was on chronic lovenox - no AC due to bleeding   4-chronic paroxysmal A. Fib - cont digoxin, cardizem and lopressor  - rate controlled  - no AC due to active bleeding   5-acute resp failure due to acute on chronic diastolic CHF - worse resp status yesterday evening, continue IV Lasix  - will monitor daily weights, I/O - CT chest with likely pulmonary vasc congestion but atypical PNA can not be ruled out  - continue doxy and Augmentin  Filed Weights   07/20/17 1844 07/20/17 2000 07/26/17 0413  Weight: 84.3 kg (185 lb 13.6 oz) 84.3 kg (185 lb 13.6 oz) 85.9 kg (189 lb 6 oz)    6-AKI on CKD: stage 3 at baseline, hypokalemia  - Cr trending down - K is WNL - BMP in AM   7-insulin dependent diabetes with renal dysfunction - cont SSI  - added insulin detemir 6 U  8-acute blood loss anemia - from blood loss out of LLE wound - improved with transfusion - so far has received 6 units of PRBC, 2 units of FFP and 1 unit of platelets - hg overall stable - CBC in AM  9-deconditioning  - still rather somnolent but easy to awake, slow to recover  physically   Code Status: DNR Family Communication: no family at bedside  Disposition Plan: SNF by Monday   Consultants:  CCS  Oncology   Procedures:  See below for x-ray reports   Antibiotics:  Zosyn, change to Augmentin 11/08 -->  HPI/Subjective: Pt reports feeling week.   Objective: Vitals:   07/27/17 0424 07/27/17 1437  BP: 135/65 140/72  Pulse: 78 82  Resp: 18 18  Temp: 98.6 F (37 C) 97.9 F (36.6 C)  SpO2: 95% 99%    Intake/Output Summary (Last 24 hours) at 07/27/2017 1739 Last data filed at 07/27/2017 1437 Gross per 24 hour  Intake 840 ml  Output 2850 ml  Net -2010 ml   Filed Weights   07/20/17 1844 07/20/17 2000 07/26/17 0413  Weight: 84.3 kg (185 lb 13.6 oz) 84.3 kg (185 lb 13.6 oz) 85.9 kg (189 lb 6 oz)   Physical Exam  Constitutional: Appears calm, NAD CVS: RRR, S1/S2 +, no gallops, no carotid bruit.  Pulmonary: Effort and breath sounds normal, mild rhonchi at bases  Abdominal: Soft. BS +,  no distension, tenderness, rebound or guarding.  Musculoskeletal: No edema and no tenderness.   Data Reviewed: Basic Metabolic Panel: Recent Labs  Lab 07/22/17 0358 07/23/17 0503 07/24/17 0448 07/25/17 0444 07/26/17 0525 07/27/17 0527  NA 141 138  --  134* 136 137  K 4.0 3.8  --  3.8 3.1* 3.6  CL 105 105  --  102 100* 98*  CO2 25 25  --  24 28 32  GLUCOSE 212* 165*  --  280* 142* 134*  BUN 54* 46*  --  45* 40* 36*  CREATININE 1.54* 1.27* 1.14* 1.19* 1.01* 1.08*  CALCIUM 8.2* 8.2*  --  8.4* 8.1* 8.1*   CBC: Recent Labs  Lab 07/23/17 0503 07/24/17 0448 07/25/17 0444 07/26/17 0525 07/27/17 0527  WBC 9.6 7.8 9.4 12.5* 17.6*  NEUTROABS 8.1* 6.9 8.6* 10.4* 14.5*  HGB 8.1* 10.1* 10.3* 10.4* 11.1*  HCT 24.1* 30.5* 30.6* 31.1* 34.7*  MCV 88.9 87.9 88.4 89.4 92.0  PLT 254 282 417* 452* 472*   BNP (last 3 results) Recent Labs    11/17/16 1500 01/18/17 1524 06/26/17 1308  BNP 1,492.9* 428.7* 704.4*    CBG: Recent Labs  Lab  07/26/17 1649 07/26/17 2303 07/27/17 0731 07/27/17 1118 07/27/17 1644  GLUCAP 211* 245* 109* 121* 190*    Recent Results (from the past 240 hour(s))  Culture, blood (routine x 2)     Status: None   Collection Time: 07/20/17  7:55 PM  Result Value Ref Range Status   Specimen Description BLOOD BLOOD LEFT FOREARM  Final   Special Requests   Final    BOTTLES DRAWN AEROBIC ONLY Blood Culture adequate volume   Culture   Final    NO GROWTH 5 DAYS Performed at [facility], [place] [address], [place], [place] [address]    Report Status 07/26/2017 FINAL  Final  Culture, blood (routine x 2)     Status: None   Collection Time: 07/20/17  7:55 PM  Result Value Ref Range Status   Specimen Description BLOOD LEFT ANTECUBITAL  Final   Special Requests   Final    BOTTLES DRAWN AEROBIC ONLY Blood Culture adequate volume   Culture   Final    NO GROWTH 5 DAYS Performed at [facility], [place] [address] [place],  [address]    Report Status 07/26/2017 FINAL  Final  MRSA PCR Screening     Status: Abnormal   Collection Time: 07/20/17  8:00 PM  Result Value Ref Range Status   MRSA by PCR POSITIVE (A) NEGATIVE Final    Comment:        The GeneXpert MRSA Assay (FDA approved for NASAL specimens only), is one component of a comprehensive MRSA colonization surveillance program. It is not intended to diagnose MRSA infection nor to guide or monitor treatment for MRSA infections. RESULT CALLED TO, READ BACK BY AND VERIFIED WITH: R MACINTOSH,RN _0  07/20/17 MKELLY      Studies: Ct Chest Wo Contrast  Result Date: 07/25/2017 CLINICAL DATA:  Chronic dyspnea and rales. EXAM: CT CHEST WITHOUT CONTRAST TECHNIQUE: Multidetector CT imaging of the chest was performed following the standard protocol without IV contrast. COMPARISON:  06/26/2017 CT FINDINGS: Cardiovascular: Stable cardiomegaly without pericardial effusion. There is mild aortic atherosclerosis without aneurysm. Three-vessel  coronary arteriosclerosis is noted. Mediastinum/Nodes: No enlarged mediastinal or axillary lymph nodes. Thyroid gland, trachea, and esophagus demonstrate no significant findings. Lungs/Pleura: No pleural effusion. Extensive ground-glass attenuation upper lobe predominant with new subsegmental areas of patchy pulmonary consolidation left greater than right. Upper Abdomen: No acute abnormality. Musculoskeletal: Spinal stimulator is again noted. Marked glenohumeral joint osteoarthritis with remodeled appearance of the glenoids. Thoracic spondylosis. IMPRESSION: 1. Stable cardiomegaly with aortic atherosclerosis and coronary arteriosclerosis. 2. Upper lobe predominant ground-glass opacities, nonspecific but can be seen with  alveolitis/pneumonitis, atypical pneumonia, stigmata of pulmonary edema and/or hypoventilatory change. Given cardiomegaly, suspect stigmata of pulmonary edema. 3. New patchy subsegmental pulmonary consolidations, suspect atelectasis. Aortic Atherosclerosis (ICD10-I70.0). Electronically Signed   By: Ashley Royalty M.D.   On: 07/25/2017 23:12    Scheduled Meds: . amoxicillin-clavulanate  1 tablet Oral Q12H  . digoxin  0.0625 mg Oral Daily  . diltiazem  240 mg Oral Daily  . doxycycline  100 mg Oral Q12H  . famciclovir  250 mg Oral Daily  . famotidine  20 mg Oral BID  . furosemide  40 mg Intravenous Daily  . gabapentin  300 mg Oral BID  . insulin aspart  0-15 Units Subcutaneous TID WC  . insulin aspart  0-5 Units Subcutaneous QHS  . insulin detemir  6 Units Subcutaneous QHS  . metoprolol tartrate  100 mg Oral BID  . polyvinyl alcohol  2 drop Both Eyes BID  . sodium chloride  1 spray Each Nare TID  . torsemide  20 mg Oral Daily  . traMADol  50 mg Oral TID   Continuous Infusions: . sodium chloride    . sodium chloride    . sodium chloride     Time spent: 25 minutes  [facility] Pager [phone]. If 7PM-7AM, please contact night-coverage at [email or web address],  password [facility] 07/27/2017, 5:39 PM  LOS: 7 days

## 2017-07-28 DIAGNOSIS — Z515 Encounter for palliative care: Secondary | ICD-10-CM

## 2017-07-28 DIAGNOSIS — Z7189 Other specified counseling: Secondary | ICD-10-CM

## 2017-07-28 LAB — GLUCOSE, CAPILLARY
GLUCOSE-CAPILLARY: 194 mg/dL — AB (ref 65–99)
Glucose-Capillary: 129 mg/dL — ABNORMAL HIGH (ref 65–99)
Glucose-Capillary: 212 mg/dL — ABNORMAL HIGH (ref 65–99)
Glucose-Capillary: 88 mg/dL (ref 65–99)

## 2017-07-28 LAB — BASIC METABOLIC PANEL
ANION GAP: 10 (ref 5–15)
BUN: 33 mg/dL — ABNORMAL HIGH (ref 6–20)
CO2: 30 mmol/L (ref 22–32)
Calcium: 8.2 mg/dL — ABNORMAL LOW (ref 8.9–10.3)
Chloride: 98 mmol/L — ABNORMAL LOW (ref 101–111)
Creatinine, Ser: 1.06 mg/dL — ABNORMAL HIGH (ref 0.44–1.00)
GFR calc Af Amer: 54 mL/min — ABNORMAL LOW (ref >60)
GFR, EST NON AFRICAN AMERICAN: 47 mL/min — AB (ref >60)
GLUCOSE: 129 mg/dL — AB (ref 65–99)
POTASSIUM: 3.6 mmol/L (ref 3.5–5.1)
Sodium: 138 mmol/L (ref 135–145)

## 2017-07-28 LAB — CBC
HCT: 36.6 % (ref 36.0–46.0)
Hemoglobin: 11.6 g/dL — ABNORMAL LOW (ref 12.0–15.0)
MCH: 29.4 pg (ref 26.0–34.0)
MCHC: 31.7 g/dL (ref 30.0–36.0)
MCV: 92.9 fL (ref 78.0–100.0)
PLATELETS: 340 10*3/uL (ref 150–400)
RBC: 3.94 MIL/uL (ref 3.87–5.11)
RDW: 17.2 % — ABNORMAL HIGH (ref 11.5–15.5)
WBC: 16.2 10*3/uL — ABNORMAL HIGH (ref 4.0–10.5)

## 2017-07-28 LAB — BRAIN NATRIURETIC PEPTIDE: B Natriuretic Peptide: 203.7 pg/mL — ABNORMAL HIGH (ref 0.0–100.0)

## 2017-07-28 MED ORDER — POLYETHYLENE GLYCOL 3350 17 G PO PACK
17.0000 g | PACK | Freq: Every day | ORAL | Status: DC
  Administered 2017-07-28 – 2017-07-29 (×2): 17 g via ORAL
  Filled 2017-07-28 (×2): qty 1

## 2017-07-28 NOTE — NC FL2 (Signed)
[place] MEDICAID FL2 LEVEL OF CARE SCREENING TOOL     IDENTIFICATION  Patient Name: [name] [name] Birthdate: 06-22-1931 Sex: female Admission Date (Current Location): 07/20/2017  [facility] and [place] Number:  Herbalist and Address:  [facility],  [facility], [place]      Provider Number: [id number]  Attending Physician Name and Address:  Theodis Blaze, MD  Relative Name and Phone Number:  Alexandria Shiflett, son, [phone]    Current Level of Care: Hospital Recommended Level of Care: [place] Prior Approval Number:    Date Approved/Denied:   PASRR Number: [id number]  Discharge Plan: SNF    Current Diagnoses: Patient Active Problem List   Diagnosis Date Noted  . Acute blood loss anemia   . Sepsis ([place]) 07/20/2017  . Bleeding from wound 07/20/2017  . Adult failure to thrive   . DNR (do not resuscitate)   . Acute lower UTI   . Acute encephalopathy 01/18/2017  . Chronic diastolic HF (heart failure) ([place]) 01/18/2017  . Chronic respiratory failure with hypoxia ([place]) 01/18/2017  . CKD (chronic kidney disease), stage III ([place]) 11/21/2016  . Obesity (BMI 30.0-34.9) 11/21/2016  . Venous stasis ulcer ([place]) 11/21/2016  . Venous stasis dermatitis of both lower extremities 11/21/2016  . MRSA carrier 11/21/2016  . Acute on chronic diastolic CHF (congestive heart failure) ([place]) 11/20/2016  . Goals of care, counseling/discussion   . Palliative care by specialist   . Chronic ITP (idiopathic thrombocytopenia) ([place]) 10/01/2016  . Gangrene ([place])   . Acute on chronic respiratory failure with hypoxia ([place])   . Coronary atherosclerosis of native coronary artery 10/09/2013  . Atrial fibrillation, chronic ([place]) 10/09/2013  . Leukocytosis 08/11/2013  . Chronic narcotic dependence ([place]) 07/16/2013  . Varicose veins of lower extremities with other complications 27/78/2423  . Edema 05/14/2013  . Hypokalemia 12/20/2012  . CAD  (coronary artery disease) of artery bypass graft 12/18/2012  . Unstable angina ([place]) 10/18/2012  . Thrombocytopenia ([place]) 10/18/2012  . HTN (hypertension) 10/17/2012  . Chronic back pain 10/17/2012  . Precordial pain 10/17/2012    Orientation RESPIRATION BLADDER Height & Weight     Self, Time, Situation, Place  O2(nasal cannula 2L) Incontinent, External catheter Weight: 189 lb 6 oz (85.9 kg) Height:  5' 4" (162.6 cm)  BEHAVIORAL SYMPTOMS/MOOD NEUROLOGICAL BOWEL NUTRITION STATUS      Incontinent Diet(please see DC summary)  AMBULATORY STATUS COMMUNICATION OF NEEDS Skin   Extensive Assist Verbally PU Stage and Appropriate Care(wound L lower leg, ABD/compression wrap; wound R buttocks, foam dressing)                       Personal Care Assistance Level of Assistance  Bathing, Feeding, Dressing Bathing Assistance: Maximum assistance Feeding assistance: Independent Dressing Assistance: Maximum assistance     Functional Limitations Info  Sight, Hearing, Speech Sight Info: Adequate Hearing Info: Adequate Speech Info: Adequate    SPECIAL CARE FACTORS FREQUENCY                       Contractures Contractures Info: Not present    Additional Factors Info  Isolation Precautions Code Status Info: DNR Allergies Info: Keflex     Isolation Precautions Info: contact precautions - MRSA     Current Medications (07/28/2017):  This is the current hospital active medication list Current Facility-Administered Medications  Medication Dose Route Frequency Provider Last Rate Last Dose  . 0.9 %  sodium chloride infusion   Intravenous Once Quintella Reichert, MD      . 0.9 %  sodium chloride infusion   Intravenous Once Florencia Reasons, MD      . 0.9 %  sodium chloride infusion   Intravenous Once Volanda Napoleon, MD      . amoxicillin-clavulanate (AUGMENTIN) 875-125 MG per tablet 1 tablet  1 tablet Oral Q12H Theodis Blaze, MD   1 tablet at 07/28/17 [phone]  . digoxin (LANOXIN) tablet 0.0625  mg  0.0625 mg Oral Daily Florencia Reasons, MD   0.0625 mg at 07/28/17 1030  . diltiazem (CARDIZEM CD) 24 hr capsule 240 mg  240 mg Oral Daily Barton Dubois, MD   240 mg at 07/28/17 0902  . doxycycline (VIBRA-TABS) tablet 100 mg  100 mg Oral Q12H Theodis Blaze, MD   100 mg at 07/28/17 4628  . famciclovir [facility]) tablet 250 mg  250 mg Oral Daily Barton Dubois, MD   250 mg at 07/28/17 1030  . famotidine (PEPCID) tablet 20 mg  20 mg Oral BID Barton Dubois, MD   20 mg at 07/28/17 0902  . furosemide (LASIX) injection 40 mg  40 mg Intravenous Daily Theodis Blaze, MD   40 mg at 07/28/17 0902  . gabapentin (NEURONTIN) capsule 300 mg  300 mg Oral BID Barton Dubois, MD   300 mg at 07/28/17 0902  . insulin aspart (novoLOG) injection 0-15 Units  0-15 Units Subcutaneous TID WC Theodis Blaze, MD   5 Units at 07/28/17 1240  . insulin aspart (novoLOG) injection 0-5 Units  0-5 Units Subcutaneous QHS Barton Dubois, MD   2 Units at 07/26/17 2336  . insulin detemir (LEVEMIR) injection 6 Units  6 Units Subcutaneous QHS Barton Dubois, MD   6 Units at 07/27/17 2323  . metoprolol tartrate (LOPRESSOR) tablet 100 mg  100 mg Oral BID Barton Dubois, MD   100 mg at 07/28/17 0902  . morphine 4 MG/ML injection 2 mg  2 mg Intravenous Q6H PRN Barton Dubois, MD   2 mg at 07/26/17 2348  . oxyCODONE (Oxy IR/ROXICODONE) immediate release tablet 10 mg  10 mg Oral Q6H PRN Florencia Reasons, MD   10 mg at 07/28/17 0902  . polyethylene glycol (MIRALAX / GLYCOLAX) packet 17 g  17 g Oral Daily Theodis Blaze, MD   17 g at 07/28/17 1251  . polyvinyl alcohol (LIQUIFILM TEARS) 1.4 % ophthalmic solution 2 drop  2 drop Both Eyes BID Florencia Reasons, MD   2 drop at 07/28/17 0903  . sodium chloride (OCEAN) 0.65 % nasal spray 1 spray  1 spray Each Nare TID Florencia Reasons, MD   1 spray at 07/28/17 [phone]  . traMADol (ULTRAM) tablet 50 mg  50 mg Oral TID Florencia Reasons, MD   50 mg at 07/28/17 7711     Discharge Medications: Please see discharge summary for a list of  discharge medications.  Relevant Imaging Results:  Relevant Lab Results:   Additional Information SSN: [id number]  Estanislado Emms, LCSW

## 2017-07-28 NOTE — Progress Notes (Signed)
[facility] PROGRESS NOTE  NABIHA PLANCK RKY:[id number] DOB: 1930/12/05 DOA: 07/20/2017   PCP: Lajean Manes, MD  Interim summary and HPI 83 y.o. female With history of ITP, A. fib, DVT on anticoagulation, is sent from nursing home to emergency room due to rapid enlarging leg wound. Patient with active bleeding and rapid hemodynamically compromising.  Assessment/Plan: 1-LLE with large bleeding wound - with concerns for reaction from lovenox - clinically better this AM  - cont wound care  - continue prophylactic abx - ABX have been changed to Augmentin and doxycycline  - WBC is still elevated but overall trending down - CBC in AM  2-ITP - no need for transfusion at this time - IVIG stopped 11/08  - Plt stable and WNL  3-right upper extremity non-occlusive DVT and hx of A. Fib - CHADsVASC 3 - was on chronic lovenox - no AC due to bleeding   4-chronic paroxysmal A. Fib - cont digoxin, cardizem and lopressor  - rate controlled  - no AC due to active bleeding   5-acute resp failure due to acute on chronic diastolic CHF - respiratory status improving  - CT chest with likely pulmonary vasc congestion but atypical PNA can not be ruled out  - continue doxy and Augmentin  For ? PNA - continue Lasix 40 mg IV QD Filed Weights   07/20/17 2000 07/26/17 0413 07/28/17 0548  Weight: 84.3 kg (185 lb 13.6 oz) 85.9 kg (189 lb 6 oz) 85.9 kg (189 lb 6 oz)  - monitor daily weights, strict I/O   6-AKI on CKD: stage 3 at baseline, hypokalemia  - Cr overall stable, K is stable - BMP in AM  7-insulin dependent diabetes with renal dysfunction - cont SSI  - continue insulin detemir 6 U  8-acute blood loss anemia - from blood loss out of LLE wound - improved with transfusion - so far has received 6 units of PRBC, 2 units of FFP and 1 unit of platelets - Hg overall stable, CBC in AM  9-deconditioning  - somewhat more alert this am  Code Status: DNR Family Communication: no  family at bedside  Disposition Plan: SNF by Monday   Consultants:  CCS  Oncology   PCCM  Procedures:  See below for x-ray reports   Antibiotics:  Zosyn, change to Augmentin 11/08 -->  HPI/Subjective: Pt reports feeling bit better this AM.   Objective: Vitals:   07/27/17 2209 07/28/17 0548  BP: 140/84 127/73  Pulse: 87 66  Resp: 20 16  Temp: 99.3 F (37.4 C) 98 F (36.7 C)  SpO2: 99% 99%    Intake/Output Summary (Last 24 hours) at 07/28/2017 1212 Last data filed at 07/28/2017 1000 Gross per 24 hour  Intake 960 ml  Output 1150 ml  Net -190 ml   Filed Weights   07/20/17 2000 07/26/17 0413 07/28/17 0548  Weight: 84.3 kg (185 lb 13.6 oz) 85.9 kg (189 lb 6 oz) 85.9 kg (189 lb 6 oz)   Physical Exam  Constitutional: Appears calm, chronically ill, NAD CVS: RRR, S1/S2 +, no murmurs, no gallops, no carotid bruit.  Pulmonary: Effort and breath sounds normal, mild crackles at bases  Abdominal: Soft. BS +,  no distension, tenderness, rebound or guarding.  Musculoskeletal: Normal range of motion.   Data Reviewed: Basic Metabolic Panel: Recent Labs  Lab 07/23/17 0503 07/24/17 0448 07/25/17 0444 07/26/17 0525 07/27/17 0527 07/28/17 0535  NA 138  --  134* 136 137 138  K 3.8  --  3.8 3.1* 3.6 3.6  CL 105  --  102 100* 98* 98*  CO2 25  --  24 28 32 30  GLUCOSE 165*  --  280* 142* 134* 129*  BUN 46*  --  45* 40* 36* 33*  CREATININE 1.27* 1.14* 1.19* 1.01* 1.08* 1.06*  CALCIUM 8.2*  --  8.4* 8.1* 8.1* 8.2*   CBC: Recent Labs  Lab 07/23/17 0503 07/24/17 0448 07/25/17 0444 07/26/17 0525 07/27/17 0527 07/28/17 0535  WBC 9.6 7.8 9.4 12.5* 17.6* 16.2*  NEUTROABS 8.1* 6.9 8.6* 10.4* 14.5*  --   HGB 8.1* 10.1* 10.3* 10.4* 11.1* 11.6*  HCT 24.1* 30.5* 30.6* 31.1* 34.7* 36.6  MCV 88.9 87.9 88.4 89.4 92.0 92.9  PLT 254 282 417* 452* 472* 340   BNP (last 3 results) Recent Labs    01/18/17 1524 06/26/17 1308 07/28/17 0535  BNP 428.7* 704.4* 203.7*     CBG: Recent Labs  Lab 07/27/17 1118 07/27/17 1644 07/27/17 2229 07/28/17 0730 07/28/17 1117  GLUCAP 121* 190* 149* 129* 212*    Recent Results (from the past 240 hour(s))  Culture, blood (routine x 2)     Status: None   Collection Time: 07/20/17  7:55 PM  Result Value Ref Range Status   Specimen Description BLOOD BLOOD LEFT FOREARM  Final   Special Requests   Final    BOTTLES DRAWN AEROBIC ONLY Blood Culture adequate volume   Culture   Final    NO GROWTH 5 DAYS Performed at [facility], [place] [address], [place], [place] [address]    Report Status 07/26/2017 FINAL  Final  Culture, blood (routine x 2)     Status: None   Collection Time: 07/20/17  7:55 PM  Result Value Ref Range Status   Specimen Description BLOOD LEFT ANTECUBITAL  Final   Special Requests   Final    BOTTLES DRAWN AEROBIC ONLY Blood Culture adequate volume   Culture   Final    NO GROWTH 5 DAYS Performed at [facility], [place] [address], [place],  [address]    Report Status 07/26/2017 FINAL  Final  MRSA PCR Screening     Status: Abnormal   Collection Time: 07/20/17  8:00 PM  Result Value Ref Range Status   MRSA by PCR POSITIVE (A) NEGATIVE Final    Comment:        The GeneXpert MRSA Assay (FDA approved for NASAL specimens only), is one component of a comprehensive MRSA colonization surveillance program. It is not intended to diagnose MRSA infection nor to guide or monitor treatment for MRSA infections. RESULT CALLED TO, READ BACK BY AND VERIFIED WITH: R MACINTOSH,RN _0  07/20/17 MKELLY      Studies: No results found.  Scheduled Meds: . amoxicillin-clavulanate  1 tablet Oral Q12H  . digoxin  0.0625 mg Oral Daily  . diltiazem  240 mg Oral Daily  . doxycycline  100 mg Oral Q12H  . famciclovir  250 mg Oral Daily  . famotidine  20 mg Oral BID  . furosemide  40 mg Intravenous Daily  . gabapentin  300 mg Oral BID  . insulin aspart  0-15 Units Subcutaneous TID WC  .  insulin aspart  0-5 Units Subcutaneous QHS  . insulin detemir  6 Units Subcutaneous QHS  . metoprolol tartrate  100 mg Oral BID  . polyvinyl alcohol  2 drop Both Eyes BID  . sodium chloride  1 spray Each Nare TID  . traMADol  50 mg  Oral TID   Continuous Infusions: . sodium chloride    . sodium chloride    . sodium chloride     Time spent: 25 minutes  [place] [facility] Pager [phone]. If 7PM-7AM, please contact night-coverage at [email or web address], password [facility] 07/28/2017, 12:12 PM  LOS: 8 days

## 2017-07-28 NOTE — Plan of Care (Signed)
  Pain Managment: General experience of comfort will improve 07/28/2017 2348 - Progressing by Mickie Kay, RN

## 2017-07-28 NOTE — Progress Notes (Signed)
[facility] PROGRESS NOTE  [name] [name] GNF:[id number] DOB: 07-Jan-1931 DOA: 07/20/2017   PCP: [name] Manes, MD  Interim summary and HPI 83 y.o. female With history of ITP, A. fib, DVT on anticoagulation, is sent from nursing home to emergency room due to rapid enlarging leg wound. Patient with active bleeding and rapid hemodynamically compromising.  Assessment/Plan: 1-LLE with large bleeding wound - with concerns for reaction from lovenox - clinically better this AM  - cont wound care  - continue prophylactic abx - ABX have been changed to Augmentin and doxycycline  - WBC is still elevated but overall trending down - CBC in AM  2-ITP - no need for transfusion at this time - IVIG stopped 11/08  - Plt stable and WNL  3-right upper extremity non-occlusive DVT and hx of A. Fib - CHADsVASC 3 - was on chronic lovenox - no AC due to bleeding   4-chronic paroxysmal A. Fib - cont digoxin, cardizem and lopressor  - rate controlled  - no AC due to active bleeding   5-acute resp failure due to acute on chronic diastolic CHF - respiratory status improving  - CT chest with likely pulmonary vasc congestion but atypical PNA can not be ruled out  - continue doxy and Augmentin  For ? PNA - continue Lasix 40 mg IV QD Filed Weights   07/20/17 2000 07/26/17 0413 07/28/17 0548  Weight: 84.3 kg (185 lb 13.6 oz) 85.9 kg (189 lb 6 oz) 85.9 kg (189 lb 6 oz)  - monitor daily weights, strict I/O - will d/c cardiac monitor as pt with no chest pain and wants monitor off    6-AKI on CKD: stage 3 at baseline, hypokalemia  - Cr overall stable, K is stable - BMP in AM  7-insulin dependent diabetes with renal dysfunction - cont SSI  - continue insulin detemir 6 U  8-acute blood loss anemia - from blood loss out of LLE wound - improved with transfusion - so far has received 6 units of PRBC, 2 units of FFP and 1 unit of platelets - Hg overall stable, CBC in AM  9-deconditioning  -  somewhat more alert this am  Code Status: DNR Family Communication: no family at bedside  Disposition Plan: SNF by Monday   Consultants:  CCS  Oncology   PCCM  Procedures:  See below for x-ray reports   Antibiotics:  Zosyn, change to Augmentin 11/08 -->  HPI/Subjective: Pt reports feeling bit better this AM.   Objective: Vitals:   07/27/17 2209 07/28/17 0548  BP: 140/84 127/73  Pulse: 87 66  Resp: 20 16  Temp: 99.3 F (37.4 C) 98 F (36.7 C)  SpO2: 99% 99%    Intake/Output Summary (Last 24 hours) at 07/28/2017 1218 Last data filed at 07/28/2017 1000 Gross per 24 hour  Intake 960 ml  Output 1150 ml  Net -190 ml   Filed Weights   07/20/17 2000 07/26/17 0413 07/28/17 0548  Weight: 84.3 kg (185 lb 13.6 oz) 85.9 kg (189 lb 6 oz) 85.9 kg (189 lb 6 oz)   Physical Exam  Constitutional: Appears calm, chronically ill, NAD CVS: RRR, S1/S2 +, no murmurs, no gallops, no carotid bruit.  Pulmonary: Effort and breath sounds normal, mild crackles at bases  Abdominal: Soft. BS +,  no distension, tenderness, rebound or guarding.  Musculoskeletal: Normal range of motion.   Data Reviewed: Basic Metabolic Panel: Recent Labs  Lab 07/23/17 0503 07/24/17 0448 07/25/17 8469 07/26/17 0525 07/27/17 0527 07/28/17  0535  NA 138  --  134* 136 137 138  K 3.8  --  3.8 3.1* 3.6 3.6  CL 105  --  102 100* 98* 98*  CO2 25  --  24 28 32 30  GLUCOSE 165*  --  280* 142* 134* 129*  BUN 46*  --  45* 40* 36* 33*  CREATININE 1.27* 1.14* 1.19* 1.01* 1.08* 1.06*  CALCIUM 8.2*  --  8.4* 8.1* 8.1* 8.2*   CBC: Recent Labs  Lab 07/23/17 0503 07/24/17 0448 07/25/17 0444 07/26/17 0525 07/27/17 0527 07/28/17 0535  WBC 9.6 7.8 9.4 12.5* 17.6* 16.2*  NEUTROABS 8.1* 6.9 8.6* 10.4* 14.5*  --   HGB 8.1* 10.1* 10.3* 10.4* 11.1* 11.6*  HCT 24.1* 30.5* 30.6* 31.1* 34.7* 36.6  MCV 88.9 87.9 88.4 89.4 92.0 92.9  PLT 254 282 417* 452* 472* 340   BNP (last 3 results) Recent Labs     01/18/17 1524 06/26/17 1308 07/28/17 0535  BNP 428.7* 704.4* 203.7*    CBG: Recent Labs  Lab 07/27/17 1118 07/27/17 1644 07/27/17 2229 07/28/17 0730 07/28/17 1117  GLUCAP 121* 190* 149* 129* 212*    Recent Results (from the past 240 hour(s))  Culture, blood (routine x 2)     Status: None   Collection Time: 07/20/17  7:55 PM  Result Value Ref Range Status   Specimen Description BLOOD BLOOD LEFT FOREARM  Final   Special Requests   Final    BOTTLES DRAWN AEROBIC ONLY Blood Culture adequate volume   Culture   Final    NO GROWTH 5 DAYS Performed at [facility], [place] [address], [place], [place] [address]    Report Status 07/26/2017 FINAL  Final  Culture, blood (routine x 2)     Status: None   Collection Time: 07/20/17  7:55 PM  Result Value Ref Range Status   Specimen Description BLOOD LEFT ANTECUBITAL  Final   Special Requests   Final    BOTTLES DRAWN AEROBIC ONLY Blood Culture adequate volume   Culture   Final    NO GROWTH 5 DAYS Performed at [facility], [place] [address] [place], [place] [address]    Report Status 07/26/2017 FINAL  Final  MRSA PCR Screening     Status: Abnormal   Collection Time: 07/20/17  8:00 PM  Result Value Ref Range Status   MRSA by PCR POSITIVE (A) NEGATIVE Final    Comment:        The GeneXpert MRSA Assay (FDA approved for NASAL specimens only), is one component of a comprehensive MRSA colonization surveillance program. It is not intended to diagnose MRSA infection nor to guide or monitor treatment for MRSA infections. RESULT CALLED TO, READ BACK BY AND VERIFIED WITH: R MACINTOSH,RN _0  07/20/17 MKELLY      Studies: No results found.  Scheduled Meds: . amoxicillin-clavulanate  1 tablet Oral Q12H  . digoxin  0.0625 mg Oral Daily  . diltiazem  240 mg Oral Daily  . doxycycline  100 mg Oral Q12H  . famciclovir  250 mg Oral Daily  . famotidine  20 mg Oral BID  . furosemide  40 mg Intravenous Daily  . gabapentin  300  mg Oral BID  . insulin aspart  0-15 Units Subcutaneous TID WC  . insulin aspart  0-5 Units Subcutaneous QHS  . insulin detemir  6 Units Subcutaneous QHS  . metoprolol tartrate  100 mg Oral BID  . polyethylene glycol  17 g Oral Daily  .  polyvinyl alcohol  2 drop Both Eyes BID  . sodium chloride  1 spray Each Nare TID  . traMADol  50 mg Oral TID   Continuous Infusions: . sodium chloride    . sodium chloride    . sodium chloride     Time spent: 25 minutes  [facility] Pager [phone]. If 7PM-7AM, please contact night-coverage at [email or web address], password [facility] 07/28/2017, 12:18 PM  LOS: 8 days

## 2017-07-29 LAB — CBC
HCT: 34.4 % — ABNORMAL LOW (ref 36.0–46.0)
HEMOGLOBIN: 10.9 g/dL — AB (ref 12.0–15.0)
MCH: 29.4 pg (ref 26.0–34.0)
MCHC: 31.7 g/dL (ref 30.0–36.0)
MCV: 92.7 fL (ref 78.0–100.0)
Platelets: 229 10*3/uL (ref 150–400)
RBC: 3.71 MIL/uL — AB (ref 3.87–5.11)
RDW: 17.2 % — AB (ref 11.5–15.5)
WBC: 14 10*3/uL — AB (ref 4.0–10.5)

## 2017-07-29 LAB — BASIC METABOLIC PANEL
ANION GAP: 6 (ref 5–15)
BUN: 36 mg/dL — ABNORMAL HIGH (ref 6–20)
CALCIUM: 8.2 mg/dL — AB (ref 8.9–10.3)
CHLORIDE: 100 mmol/L — AB (ref 101–111)
CO2: 32 mmol/L (ref 22–32)
CREATININE: 0.95 mg/dL (ref 0.44–1.00)
GFR calc Af Amer: >60 mL/min (ref >60)
GFR calc non Af Amer: 53 mL/min — ABNORMAL LOW (ref >60)
Glucose, Bld: 104 mg/dL — ABNORMAL HIGH (ref 65–99)
Potassium: 3.7 mmol/L (ref 3.5–5.1)
SODIUM: 138 mmol/L (ref 135–145)

## 2017-07-29 LAB — GLUCOSE, CAPILLARY: Glucose-Capillary: 98 mg/dL (ref 65–99)

## 2017-07-29 MED ORDER — MUPIROCIN 2 % EX OINT
1.0000 "application " | TOPICAL_OINTMENT | Freq: Two times a day (BID) | CUTANEOUS | Status: DC
  Administered 2017-07-29: 1 via NASAL

## 2017-07-29 MED ORDER — POLYETHYLENE GLYCOL 3350 17 G PO PACK: 17.0000 g | PACK | Freq: Every day | ORAL | 0 refills | Status: AC

## 2017-07-29 MED ORDER — OXYCODONE HCL 10 MG PO TABS: 10.0000 mg | ORAL_TABLET | Freq: Four times a day (QID) | ORAL | 0 refills | Status: AC | PRN

## 2017-07-29 MED ORDER — DOXYCYCLINE HYCLATE 100 MG PO TABS: 100.0000 mg | ORAL_TABLET | Freq: Two times a day (BID) | ORAL | 0 refills | Status: AC

## 2017-07-29 MED ORDER — CHLORHEXIDINE GLUCONATE CLOTH 2 % EX PADS
6.0000 | MEDICATED_PAD | Freq: Every day | CUTANEOUS | Status: DC
  Administered 2017-07-29: 6 via TOPICAL

## 2017-07-29 MED ORDER — TRAMADOL HCL 50 MG PO TABS: 50.0000 mg | ORAL_TABLET | Freq: Two times a day (BID) | ORAL | 0 refills | Status: AC | PRN

## 2017-07-29 NOTE — Progress Notes (Signed)
07/29/17  1234  Report Given to [place] at [place].

## 2017-07-29 NOTE — Discharge Summary (Signed)
Physician Discharge Summary  [name] [name] WRU:[id number] DOB: 01-05-1931 DOA: 07/20/2017  PCP: Lajean Manes, MD  Admit date: 07/20/2017 Discharge date: 07/29/2017  Recommendations for Outpatient Follow-up:  1. Pt will need to follow up with PCP in 1 week post discharge 2. Please obtain BMP to evaluate electrolytes and kidney function 3. Please also check CBC to evaluate Hg and Hct levels 4. Patient needs to complete therapy with doxycycline for 3 more days post discharge 5. Please note that anticoagulation has been discontinued due to high risk of bleeding, patient made aware  Discharge Diagnoses:  Active Problems:   Atrial fibrillation, chronic (HCC)   Chronic diastolic HF (heart failure) (HCC)   Sepsis (HCC)   Bleeding from wound   Acute blood loss anemia  Discharge Condition: Stable  Diet recommendation: Heart healthy diet discussed in details   History of present illness:  83 y.o.femaleWith history of ITP, A. fib, DVT on anticoagulation, is sent from nursing home to emergency room due to rapid enlarging leg wound. Patient with active bleeding and rapid hemodynamically compromising.  Assessment/Plan: 1-LLE with large bleeding wound - with concerns for reaction from lovenox - Patient continues to clinically improve - Surgery team has seen patient and recommend only continuation of wound care, no surgical intervention indicated - Patient has been on Zosyn and changed to Augmentin, completed 10 days of therapy - White blood cell continues trending down  2-ITP - no need for transfusion at this time - IVIG stopped 11/08  - Platelet count has remained stable  3-right upper extremity non-occlusive DVT and hx of A. Fib - CHADsVASC 3 - was on chronic lovenox - no AC due to bleeding, recommended per oncologist  4-chronic paroxysmal A. Fib - cont digoxin, cardizem and lopressor  - rate controlled  - no AC due to active bleeding   5-acute resp failure due to  acute on chronic diastolic CHF - Respiratory status stable - CT chest with likely pulmonary vasc congestion but atypical PNA can not be ruled out  - Due to concern for atypical pneumonia doxycycline was started and patient to complete 3 more days post discharge - Regarding CHF, patient has been treated with Lasix and responded well - Weight trend in the past 72 hours: Filed Weights   07/26/17 0413 07/28/17 0548 07/29/17 0508  Weight: 85.9 kg (189 lb 6 oz) 85.9 kg (189 lb 6 oz) 84.8 kg (186 lb 14.4 oz)  - patient to resume torsemide per home medical regimen   6-AKI on CKD: stage 3 at baseline, hypokalemia  - Creatinine and potassium overall stable  7-insulin dependent diabetes with renal dysfunction - continue insulin detemir 6 U  8-acute blood loss anemia - from blood loss out of LLE wound - improved with transfusion - so far has received 6 units of PRBC, 2 units of FFP and 1 unit of platelets - Hemoglobin overall stable with no evidence of active bleeding  9-deconditioning  - Patient alert this morning, eating breakfast - Continue physical therapy at the facility  Code Status: DNR Family Communication: no family at bedside  Disposition Plan: SNF   Consultants:  CCS  Oncology   PCT  Procedures:  See below for x-ray reports   Antibiotics:  Zosyn, change to Augmentin 11/08 --> 11/12  Doxycycline complete for 3 more days post discharge for atypical pneumonia, please note the doxycycline was started 07/25/2017  Procedures/Studies: Dg Tibia/fibula Left  Result Date: 07/20/2017 CLINICAL DATA:  New left lower extremity wound since  this morning. The wound has tripled in size since this morning. EXAM: LEFT TIBIA AND FIBULA - 2 VIEW COMPARISON:  08/10/2013. FINDINGS: Large proximal left lower leg soft tissue ulceration with surrounding bandage material. Multiple loculations of associated gas. No periosteal reaction or bone destruction. Diffuse soft tissue swelling.  Atheromatous arterial calcifications. Extensive left knee degenerative changes. IMPRESSION: 1. Large proximal left lower leg soft tissue ulceration with associated gas, suspicious for a gas-forming organism. 2. No evidence of underlying osteomyelitis. 3. Marked left knee degenerative changes. Electronically Signed   By: Claudie Revering M.D.   On: 07/20/2017 13:39   Ct Head Wo Contrast  Result Date: 07/20/2017 CLINICAL DATA:  Altered mental status today. EXAM: CT HEAD WITHOUT CONTRAST TECHNIQUE: Contiguous axial images were obtained from the base of the skull through the vertex without intravenous contrast. COMPARISON:  06/26/2017. FINDINGS: Brain: Diffusely enlarged ventricles and subarachnoid spaces. Patchy white matter low density in both cerebral hemispheres. No intracranial hemorrhage, mass lesion or CT evidence of acute infarction. Vascular: No hyperdense vessel or unexpected calcification. Skull: Normal. Negative for fracture or focal lesion. Sinuses/Orbits: Status post bilateral cataract extraction. Unremarkable paranasal sinuses. Other: None. IMPRESSION: 1. No acute abnormality. 2. Stable diffuse cerebral and cerebellar atrophy and chronic small vessel white matter ischemic changes in both cerebral hemispheres. Electronically Signed   By: Claudie Revering M.D.   On: 07/20/2017 14:53   Ct Chest Wo Contrast  Result Date: 07/25/2017 CLINICAL DATA:  Chronic dyspnea and rales. EXAM: CT CHEST WITHOUT CONTRAST TECHNIQUE: Multidetector CT imaging of the chest was performed following the standard protocol without IV contrast. COMPARISON:  06/26/2017 CT FINDINGS: Cardiovascular: Stable cardiomegaly without pericardial effusion. There is mild aortic atherosclerosis without aneurysm. Three-vessel coronary arteriosclerosis is noted. Mediastinum/Nodes: No enlarged mediastinal or axillary lymph nodes. Thyroid gland, trachea, and esophagus demonstrate no significant findings. Lungs/Pleura: No pleural effusion. Extensive  ground-glass attenuation upper lobe predominant with new subsegmental areas of patchy pulmonary consolidation left greater than right. Upper Abdomen: No acute abnormality. Musculoskeletal: Spinal stimulator is again noted. Marked glenohumeral joint osteoarthritis with remodeled appearance of the glenoids. Thoracic spondylosis. IMPRESSION: 1. Stable cardiomegaly with aortic atherosclerosis and coronary arteriosclerosis. 2. Upper lobe predominant ground-glass opacities, nonspecific but can be seen with alveolitis/pneumonitis, atypical pneumonia, stigmata of pulmonary edema and/or hypoventilatory change. Given cardiomegaly, suspect stigmata of pulmonary edema. 3. New patchy subsegmental pulmonary consolidations, suspect atelectasis. Aortic Atherosclerosis (ICD10-I70.0). Electronically Signed   By: Ashley Royalty M.D.   On: 07/25/2017 23:12   Dg Chest Port 1 View  Result Date: 07/25/2017 CLINICAL DATA:  Dyspnea, cough, and chest congestion. EXAM: PORTABLE CHEST 1 VIEW COMPARISON:  Chest x-ray and chest CT scan of June 26, 2017 FINDINGS: The lungs are mildly hypoinflated. The interstitial markings are increased but much improved over the previous study. The cardiac silhouette remains enlarged. The pulmonary vascularity is not engorged. There is tortuosity of the descending thoracic aorta. A nerve stimulator electrode projects over the midthoracic spine. There degenerative changes of both shoulders. IMPRESSION: Low-grade compensated CHF.  No alveolar pneumonia. Electronically Signed   By: David  Martinique M.D.   On: 07/25/2017 16:23   Dg Hip Unilat W Or Wo Pelvis 2-3 Views Left  Result Date: 07/20/2017 CLINICAL DATA:  Large left leg wound that has tripled in size since this morning. EXAM: DG HIP (WITH OR WITHOUT PELVIS) 2-3V LEFT COMPARISON:  Abdomen and pelvis CT dated 12/20/2012. FINDINGS: A right hip bipolar prosthesis is again demonstrated as well as lower lumbar spine laminectomy  defects and fixation hardware.  Left hip degenerative changes with marked superior joint space narrowing and some subarticular sclerosis and cyst formation, with mild progression. Interval ossification in the region of the iliopsoas tendon attachment. No fracture or dislocation. Atheromatous arterial calcifications. IMPRESSION: 1. Moderate to marked left hip degenerative changes with mild progression. 2. Interval calcification in the region of the distal iliopsoas tendon. 3. No acute abnormality. Electronically Signed   By: Claudie Revering M.D.   On: 07/20/2017 13:43   Discharge Exam: Vitals:   07/29/17 0508 07/29/17 0930  BP: 127/67 138/63  Pulse: 82 76  Resp: 18 20  Temp: 98.2 F (36.8 C)   SpO2: 90% 99%   Vitals:   07/28/17 1401 07/28/17 2051 07/29/17 0508 07/29/17 0930  BP: (!) 137/47 (!) 139/50 127/67 138/63  Pulse: 80 65 82 76  Resp: _0 Temp: 97.8 F (36.6 C) 98.6 F (37 C) 98.2 F (36.8 C)   TempSrc: Axillary Axillary Axillary   SpO2: 97% 96% 90% 99%  Weight:   84.8 kg (186 lb 14.4 oz)   Height:        General: Pt is alert, stable, not in acute distress Cardiovascular: Regular rate and rhythm, S1/S2 +, no rubs, no gallops Respiratory: Clear to auscultation bilaterally, mild rhonchi at bases  Abdominal: Soft, non tender, non distended, bowel sounds +, no guarding Extremities: left lower leg is wrapped, still has some ecchymosis but overall better   Discharge Instructions  Discharge Instructions    Diet - low sodium heart healthy   Complete by:  As directed    Increase activity slowly   Complete by:  As directed      Allergies as of 07/29/2017      Reactions   Keflex [cephalexin] Other (See Comments)   Reaction:  Unknown       Medication List    STOP taking these medications   dexamethasone 2 MG tablet Commonly known as:  DECADRON   enoxaparin 100 MG/ML injection Commonly known as:  LOVENOX     TAKE these medications   CALCIUM 600+D 600-200 MG-UNIT Tabs Generic drug:  Calcium  Carbonate-Vitamin D Take 2 tablets by mouth daily.   digoxin 0.125 MG tablet Commonly known as:  LANOXIN Take 0.0625 mg by mouth daily.   diltiazem 240 MG 24 hr capsule Commonly known as:  CARDIZEM CD Take 1 capsule (240 mg total) by mouth daily.   doxycycline 100 MG tablet Commonly known as:  VIBRA-TABS Take 1 tablet (100 mg total) every 12 (twelve) hours by mouth.   famciclovir 250 MG tablet Commonly known as:  FAMVIR Take 1 tablet (250 mg total) by mouth daily.   famotidine 20 MG tablet Commonly known as:  PEPCID Take 20 mg by mouth 2 (two) times daily.   gabapentin 300 MG capsule Commonly known as:  NEURONTIN Take 300 mg by mouth 2 (two) times daily.   LEVEMIR 100 UNIT/ML injection Generic drug:  insulin detemir Inject 6 Units into the skin at bedtime.   metoprolol tartrate 100 MG tablet Commonly known as:  LOPRESSOR Take 1 tablet (100 mg total) by mouth 2 (two) times daily.   NOVOLOG FLEXPEN 100 UNIT/ML FlexPen Generic drug:  insulin aspart Inject into the skin 3 (three) times daily with meals. SSI: inject as needed for glucose control: BS <120: 0 units BS 121-150: 2 units BS 151-200: 3 units BS 201-250: 5 units BS 251-300: 8 units BS 301-350: 11 units BS 351-400: 15  units   Oxycodone HCl 10 MG Tabs Take 1 tablet (10 mg total) every 6 (six) hours as needed by mouth (pain).   polyethylene glycol packet Commonly known as:  MIRALAX / GLYCOLAX Take 17 g daily by mouth. Start taking on:  07/30/2017   potassium chloride SA 20 MEQ tablet Commonly known as:  K-DUR,KLOR-CON Take 1 tablet (20 mEq total) by mouth 2 (two) times daily.   RISA-BID PROBIOTIC PO Take 1 tablet by mouth daily.   sitaGLIPtin 50 MG tablet Commonly known as:  JANUVIA Take 50 mg by mouth daily.   sodium chloride 0.65 % Soln nasal spray Commonly known as:  OCEAN Place 1 spray into both nostrils 3 (three) times daily.   SYSTANE 0.4-0.3 % Soln Generic drug:  Polyethyl Glycol-Propyl  Glycol Place 2 drops into both eyes 2 (two) times daily.   torsemide 20 MG tablet Commonly known as:  DEMADEX Take 40 mg by mouth daily.   traMADol 50 MG tablet Commonly known as:  ULTRAM Take 1 tablet (50 mg total) every 12 (twelve) hours as needed by mouth. What changed:    when to take this  reasons to take this       Contact information for follow-up providers    Lajean Manes, MD Follow up.   Specialty:  Internal Medicine Contact information: [address] [place] [address] [phone]            Contact information for after-discharge care    Destination    [facility] Follow up.   Service:  Skilled Nursing Contact information: [address] [place] [phone]                  The results of significant diagnostics from this hospitalization (including imaging, microbiology, ancillary and laboratory) are listed below for reference.    Microbiology: Recent Results (from the past 240 hour(s))  Culture, blood (routine x 2)     Status: None   Collection Time: 07/20/17  7:55 PM  Result Value Ref Range Status   Specimen Description BLOOD BLOOD LEFT FOREARM  Final   Special Requests   Final    BOTTLES DRAWN AEROBIC ONLY Blood Culture adequate volume   Culture   Final    NO GROWTH 5 DAYS Performed at [facility], [address], [place], [place] [address]    Report Status 07/26/2017 FINAL  Final  Culture, blood (routine x 2)     Status: None   Collection Time: 07/20/17  7:55 PM  Result Value Ref Range Status   Specimen Description BLOOD LEFT ANTECUBITAL  Final   Special Requests   Final    BOTTLES DRAWN AEROBIC ONLY Blood Culture adequate volume   Culture   Final    NO GROWTH 5 DAYS Performed at [facility], [place] [address], [place], [place] [address]    Report Status 07/26/2017 FINAL  Final  MRSA PCR Screening     Status: Abnormal   Collection Time: 07/20/17  8:00 PM   Result Value Ref Range Status   MRSA by PCR POSITIVE (A) NEGATIVE Final    Comment:        The GeneXpert MRSA Assay (FDA approved for NASAL specimens only), is one component of a comprehensive MRSA colonization surveillance program. It is not intended to diagnose MRSA infection nor to guide or monitor treatment for MRSA infections. RESULT CALLED TO, READ BACK BY AND VERIFIED WITH: R MACINTOSH,RN @  2255 07/20/17 MKELLY      Labs: Basic Metabolic Panel: Recent Labs  Lab 07/25/17 0444 07/26/17 0525 07/27/17 0527 07/28/17 0535 07/29/17 0437  NA 134* 136 137 138 138  K 3.8 3.1* 3.6 3.6 3.7  CL 102 100* 98* 98* 100*  CO2 24 28 32 30 32  GLUCOSE 280* 142* 134* 129* 104*  BUN 45* 40* 36* 33* 36*  CREATININE 1.19* 1.01* 1.08* 1.06* 0.95  CALCIUM 8.4* 8.1* 8.1* 8.2* 8.2*   CBC: Recent Labs  Lab 07/23/17 0503 07/24/17 0448 07/25/17 0444 07/26/17 0525 07/27/17 0527 07/28/17 0535 07/29/17 0437  WBC 9.6 7.8 9.4 12.5* 17.6* 16.2* 14.0*  NEUTROABS 8.1* 6.9 8.6* 10.4* 14.5*  --   --   HGB 8.1* 10.1* 10.3* 10.4* 11.1* 11.6* 10.9*  HCT 24.1* 30.5* 30.6* 31.1* 34.7* 36.6 34.4*  MCV 88.9 87.9 88.4 89.4 92.0 92.9 92.7  PLT 254 282 417* 452* 472* 340 229   BNP (last 3 results) Recent Labs    01/18/17 1524 06/26/17 1308 07/28/17 0535  BNP 428.7* 704.4* 203.7*   CBG: Recent Labs  Lab 07/28/17 0730 07/28/17 1117 07/28/17 1642 07/28/17 2122 07/29/17 0734  GLUCAP 129* 212* 88 194* 98   SIGNED: Time coordinating discharge: 60 minutes  Dorsey Charette Magick-Kimetha Trulson, MD  [facility] 07/29/2017, 10:19 AM Pager [phone]  If 7PM-7AM, please contact night-coverage [email or web address] Password [email or web address]

## 2017-07-29 NOTE — Plan of Care (Signed)
  Fluid Volume: Ability to maintain a balanced intake and output will improve 07/29/2017 0850 - Progressing by Dorene Sorrow, RN   Nutrition: Adequate nutrition will be maintained 07/29/2017 0850 - Progressing by Dorene Sorrow, RN   Pain Managment: General experience of comfort will improve 07/29/2017 0850 - Progressing by Dorene Sorrow, RN

## 2017-07-29 NOTE — Consult Note (Signed)
Consultation Note Date: 07/29/2017   Patient Name: [name] [name]  DOB: 1931-08-27  MRN: [id number]  Age / Sex: 83 y.o., female  PCP: Lajean Manes, MD Referring Physician: Theodis Blaze, MD  Reason for Consultation: Establishing goals of care  HPI/Patient Profile: 83 y.o. female  with past medical history of S of heart failure, A. fib, CAD, hypertension, ITP, PVD with venous insufficiency, CKD stage III, chronic hypoxemia, DJD status post lumbar disc surgery admitted on 07/20/2017 with lower extremity wound related to ITP and venous stasis.   Clinical Assessment and Goals of Care: I met today with [name] [name] in her room.  I introduced palliative care as specialized medical care for people living with serious illness. It focuses on providing relief from the symptoms and stress of a serious illness. The goal is to improve quality of life for both the patient and the family.  During my encounter, [name] [name] was not very engaged in our conversation.  She was reading a book and did not stop reading throughout our encounter.  She reports that most of her day is spent lying in bed or chair reading or watching television.  She is able to relay that she has multiple medical problems but does not really know the specifics of her "heart trouble"or "bad blood counts."  She lives at [facility] and reports she "exists" there, but also she reports repeated visits to the hospital are still allowing her to maintain "OK" quality of life outside the hospital.  I discussed with her prior meetings with Charlynn Court as well as Wadie Lessen from our team and she reports she "sees a lot of doctors all the time." Prior recommendation has been made for palliative care to follow her at skilled facility but she is not clear if this has occurred either.  She reports having a son who helps her with decision making and I reviewed again  prior recommendation to complete healthcare power of attorney.  She states that she thinks she has this at home and is not interested in filling out any other paperwork today.  SUMMARY OF RECOMMENDATIONS   -DNR/DNI -Would like to continue with current mode of care.  I recommended that when she discharged she continued to be followed by outpatient palliative care.  She reports being agreeable to this. -I again discussed with her recommendation to ensure that she has [facility] POA paperwork completed.  She states that she has this is an outpatient and is not interested in a meeting with anyone to discuss it further this admission.  Code Status/Advance Care Planning:  DNR  Psycho-social/Spiritual:   Desire for further Chaplaincy support:no  Additional Recommendations: Caregiving  Support/Resources  Prognosis:   Unable to determine  Discharge Planning: Back to [facility] with palliative care following.  If you agree with recommendation for palliative care to follow, please ensure " palliative care to follow at skilled facility" is included in the discharge summary      Primary Diagnoses: Present on Admission: . Sepsis ([place]) . Bleeding from  wound   I have reviewed the medical record, interviewed the patient and family, and examined the patient. The following aspects are pertinent.  Past Medical History:  Diagnosis Date  . Atrial fibrillation ([place])   . CAD (coronary artery disease)    a. NSTEMI 2/14 => LHC 10/20/12:  dLM 20%, pLAD 99%, mLAD 60% followed by 99%, oD1 99%, and pD2 30%, mild plaque disease in the CFX and RCA. PCI: Xience Xpedition DES to the proximal and mid LAD  . Chronic diastolic CHF (congestive heart failure) ([place])    Echo (08/10/13): Mild LVH, EF 60-65%.  . DJD (degenerative joint disease)   . Dyslipidemia   . HTN (hypertension)   . Osteopenia   . Thrombocytopenia ([place])   . Venous ulcer ([place])    Social History   Socioeconomic History  . Marital status: Divorced     Spouse name: None  . Number of children: 2  . Years of education: None  . Highest education level: None  Social Needs  . Financial resource strain: None  . Food insecurity - worry: None  . Food insecurity - inability: None  . Transportation needs - medical: None  . Transportation needs - non-medical: None  Occupational History    Employer: RETIRED  Tobacco Use  . Smoking status: Never Smoker  . Smokeless tobacco: Never Used  Substance and Sexual Activity  . Alcohol use: No  . Drug use: No  . Sexual activity: None  Other Topics Concern  . None  Social History Narrative   Lives at [place].  Two adopted children. Divorced.     Family History  Problem Relation Age of Onset  . CAD Father 66       Vague history    Scheduled Meds: . amoxicillin-clavulanate  1 tablet Oral Q12H  . Chlorhexidine Gluconate Cloth  6 each Topical Q0600  . digoxin  0.0625 mg Oral Daily  . diltiazem  240 mg Oral Daily  . doxycycline  100 mg Oral Q12H  . famciclovir  250 mg Oral Daily  . famotidine  20 mg Oral BID  . furosemide  40 mg Intravenous Daily  . gabapentin  300 mg Oral BID  . insulin aspart  0-15 Units Subcutaneous TID WC  . insulin aspart  0-5 Units Subcutaneous QHS  . insulin detemir  6 Units Subcutaneous QHS  . metoprolol tartrate  100 mg Oral BID  . mupirocin ointment  1 application Nasal BID  . polyethylene glycol  17 g Oral Daily  . polyvinyl alcohol  2 drop Both Eyes BID  . sodium chloride  1 spray Each Nare TID  . traMADol  50 mg Oral TID   Continuous Infusions: . sodium chloride    . sodium chloride    . sodium chloride     PRN Meds:.morphine injection, oxyCODONE Medications Prior to Admission:  Prior to Admission medications   Medication Sig Start Date End Date Taking? Authorizing Provider  Calcium Carbonate-Vitamin D (CALCIUM 600+D) 600-200 MG-UNIT TABS Take 2 tablets by mouth daily.   Yes [provider]  dexamethasone (DECADRON) 2 MG tablet Take 10  tablets (20 mg total) by mouth daily. For 4 days. 07/16/17  Yes [place], Holli Humbles, NP  digoxin (LANOXIN) 0.125 MG tablet Take 0.0625 mg by mouth daily.   Yes [provider]  diltiazem (CARDIZEM CD) 240 MG 24 hr capsule Take 1 capsule (240 mg total) by mouth daily. 08/18/13  Yes Velvet Bathe, MD  enoxaparin (LOVENOX) 100 MG/ML injection  Inject 0.9 mLs (90 mg total) into the skin 2 (two) times daily. 07/04/17  Yes Patrecia Pour, MD  famciclovir (FAMVIR) 250 MG tablet Take 1 tablet (250 mg total) by mouth daily. 08/03/16  Yes [place], Holli Humbles, NP  famotidine (PEPCID) 20 MG tablet Take 20 mg by mouth 2 (two) times daily.  06/24/17  Yes [provider]  gabapentin (NEURONTIN) 300 MG capsule Take 300 mg by mouth 2 (two) times daily.  05/29/17  Yes [provider]  insulin aspart (NOVOLOG FLEXPEN) 100 UNIT/ML FlexPen Inject into the skin 3 (three) times daily with meals. SSI: inject as needed for glucose control: BS <120: 0 units BS 121-150: 2 units BS 151-200: 3 units BS 201-250: 5 units BS 251-300: 8 units BS 301-350: 11 units BS 351-400: 15 units   Yes [provider]  LEVEMIR 100 UNIT/ML injection Inject 6 Units into the skin at bedtime.  05/17/17  Yes [provider]  metoprolol (LOPRESSOR) 100 MG tablet Take 1 tablet (100 mg total) by mouth 2 (two) times daily. 08/18/13  Yes Velvet Bathe, MD  Oxycodone HCl 10 MG TABS Take 1 tablet (10 mg total) by mouth every 6 (six) hours as needed (pain). 07/04/17  Yes Patrecia Pour, MD  Polyethyl Glycol-Propyl Glycol (SYSTANE) 0.4-0.3 % SOLN Place 2 drops into both eyes 2 (two) times daily.   Yes [provider]  potassium chloride SA (K-DUR,KLOR-CON) 20 MEQ tablet Take 1 tablet (20 mEq total) by mouth 2 (two) times daily. 08/28/13  Yes [place], Scott T, PA-C  Probiotic Product (RISA-BID PROBIOTIC PO) Take 1 tablet by mouth daily.   Yes [provider]  sitaGLIPtin (JANUVIA) 50 MG tablet Take 50  mg by mouth daily.   Yes [provider]  sodium chloride (OCEAN) 0.65 % SOLN nasal spray Place 1 spray into both nostrils 3 (three) times daily.    Yes [provider]  torsemide (DEMADEX) 20 MG tablet Take 40 mg by mouth daily.  05/29/17  Yes [provider]  traMADol (ULTRAM) 50 MG tablet Take 1 tablet (50 mg total) by mouth 3 (three) times daily. 07/04/17  Yes Patrecia Pour, MD   Allergies  Allergen Reactions  . Keflex [Cephalexin] Other (See Comments)    Reaction:  Unknown    Review of Systems Constitutional: Positive for fatigue.  Neurological: Positive for weakness.   Physical Exam Constitutional: She is oriented to person, place, and time. She appears well-developed. She appears ill. Nasal cannula in place.  Cardiovascular: Normal rate, regular rhythm and normal heart sounds.   Pulmonary/Chest: She has decreased breath sounds.  Musculoskeletal:  Generalized weakness   Neurological: She is alert and oriented to person, place, and time.  Skin: Skin is warm and dry.   Bandages clean dry and intact.  Wounds not examined.  Vital Signs: BP 138/63 (BP Location: Left Arm)   Pulse 76   Temp 98.2 F (36.8 C) (Axillary)   Resp 20   Ht _0  (1.626 m)   Wt 84.8 kg (186 lb 14.4 oz)   SpO2 99%   BMI 32.08 kg/m  Pain Assessment: 0-10 POSS *See Group Information*: S-Acceptable,Sleep, easy to arouse Pain Score: 8    SpO2: SpO2: 99 % O2 Device:SpO2: 99 % O2 Flow Rate: .O2 Flow Rate (L/min): 2 L/min  IO: Intake/output summary:   Intake/Output Summary (Last 24 hours) at 07/29/2017 1005 Last data filed at 07/29/2017 0900 Gross per 24 hour  Intake 120 ml  Output 950 ml  Net -830 ml    LBM: Last BM Date: 07/29/17 Baseline Weight: Weight: 84.3 kg (185 lb 13.6 oz) Most recent weight: Weight: 84.8 kg (186 lb 14.4 oz)     Palliative Assessment/Data:   Flowsheet Rows     Most Recent Value  Intake Tab  Referral Department  Hospitalist  Unit at Time  of Referral  Med/Surg Unit  Palliative Care Primary Diagnosis  Trauma  Date Notified  07/27/17  Palliative Care Type  Return patient Palliative Care  Reason for referral  Clarify Goals of Care  Date of Admission  07/20/17  Date first seen by Palliative Care  07/28/17  # of days Palliative referral response time  1 Day(s)  # of days IP prior to Palliative referral  7  Clinical Assessment  Palliative Performance Scale Score  30%  Pain Max last 24 hours  7  Pain Min Last 24 hours  3  Psychosocial & Spiritual Assessment  Palliative Care Outcomes  Patient/Family meeting held?  Yes  Who was at the meeting?  Patient      Time In: 1800 Time Out: 1900 Time Total: 60 Greater than 50%  of this time was spent counseling and coordinating care related to the above assessment and plan.  Signed by: Micheline Rough, MD   Please contact Palliative Medicine Team phone at [phone] for questions and concerns.  For individual provider: See Shea Evans

## 2017-07-29 NOTE — Care Management Note (Signed)
Case Management Note  Patient Details  Name: [name] [name] MRN: [id number] Date of Birth: [birth year]  Subjective/Objective:                    Action/Plan:d/c SNF   Expected Discharge Date:  07/29/17               Expected Discharge Plan:  Skilled Nursing Facility  In-House Referral:  Clinical Social Work  Discharge planning Services  CM Consult  Post Acute Care Choice:    Choice offered to:     DME Arranged:    DME Agency:     HH Arranged:    [employer] Agency:     Status of Service:  Completed, signed off  If discussed at [employer], dates discussed:    Additional Comments:  Dessa Phi, RN 07/29/2017, 11:10 AM

## 2017-07-29 NOTE — Discharge Instructions (Signed)
Cellulitis, Adult Cellulitis is a skin infection. The infected area is usually red and sore. This condition occurs most often in the arms and lower legs. It is very important to get treated for this condition. Follow these instructions at home:  Take over-the-counter and prescription medicines only as told by your doctor.  If you were prescribed an antibiotic medicine, take it as told by your doctor. Do not stop taking the antibiotic even if you start to feel better.  Drink enough fluid to keep your pee (urine) clear or pale yellow.  Do not touch or rub the infected area.  Raise (elevate) the infected area above the level of your heart while you are sitting or lying down.  Place warm or cold wet cloths (warm or cold compresses) on the infected area. Do this as told by your doctor.  Keep all follow-up visits as told by your doctor. This is important. These visits let your doctor make sure your infection is not getting worse. Contact a doctor if:  You have a fever.  Your symptoms do not get better after 1-2 days of treatment.  Your bone or joint under the infected area starts to hurt after the skin has healed.  Your infection comes back. This can happen in the same area or another area.  You have a swollen bump in the infected area.  You have new symptoms.  You feel ill and also have muscle aches and pains. Get help right away if:  Your symptoms get worse.  You feel very sleepy.  You throw up (vomit) or have watery poop (diarrhea) for a long time.  There are red streaks coming from the infected area.  Your red area gets larger.  Your red area turns darker. This information is not intended to replace advice given to you by your health care provider. Make sure you discuss any questions you have with your health care provider. Document Released: 02/20/2008 Document Revised: 02/09/2016 Document Reviewed: 07/13/2015 Elsevier Interactive Patient Education  2018 [employer].

## 2017-07-29 NOTE — Progress Notes (Signed)
Patient recently assessed on 06/28/2017, no psychosocial changes.   Patient from [facility] SNF for [name] [name] term care and verbalized plan to return at discharge.  CSW contacted [facility] SNF and informed them of patient's discharge, staff confirmed patient's ability to return.  CSW contacted [facility], patient aware. CSW contacted patient's son [name] [name] [phone]), no answer nor option to leave a voicemail.   Patient's RN can call report to [phone], packet complete. CSW signing off, no other needs identified.  [name] [name], [place] Social Worker [facility] Cell#: [phone]

## 2017-07-30 ENCOUNTER — Ambulatory Visit: Payer: Medicare Other | Admitting: Hematology & Oncology

## 2017-07-30 ENCOUNTER — Ambulatory Visit: Payer: Medicare Other

## 2017-07-30 ENCOUNTER — Other Ambulatory Visit: Payer: Medicare Other

## 2017-09-17 DEATH — deceased

## 2019-11-02 IMAGING — CT CT HEAD W/O CM
3 of 6 series · 14 of 47 positions shown, 16 images · non-contrast
Comparison: 06/26/2017.

CLINICAL DATA: Altered mental status today.

EXAM:
CT HEAD WITHOUT CONTRAST
TECHNIQUE: Contiguous axial images were obtained from the base of the skull
through the vertex without intravenous contrast.

[Series 2: head w/o · axial · non-contrast · 0.45mm/px · z∈[-88,+22]mm · 8 of 28 slices shown, 10 images]
[im 3/28  brain]
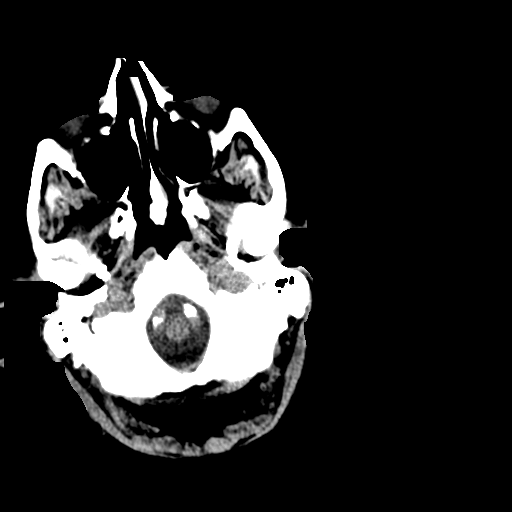
[im 3/28  bone]
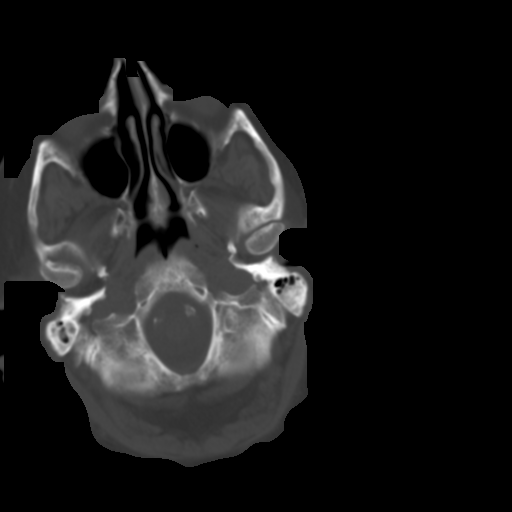
[im 7/28  brain]
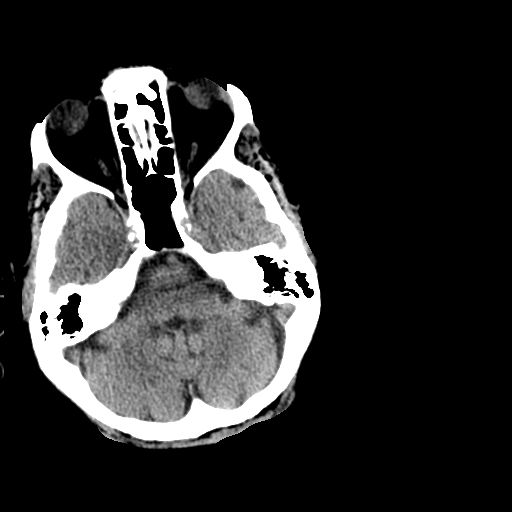
[im 10/28  brain]
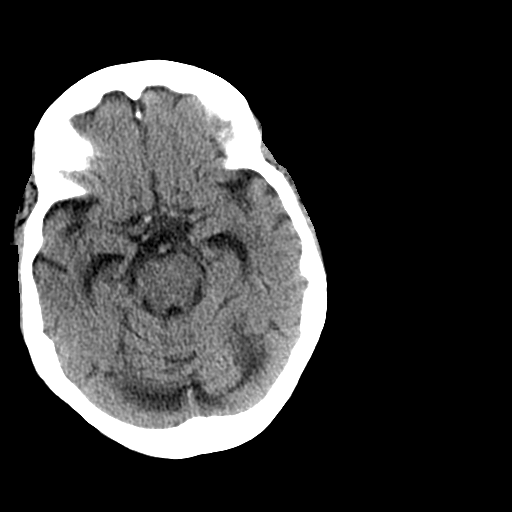
[im 12/28  brain]
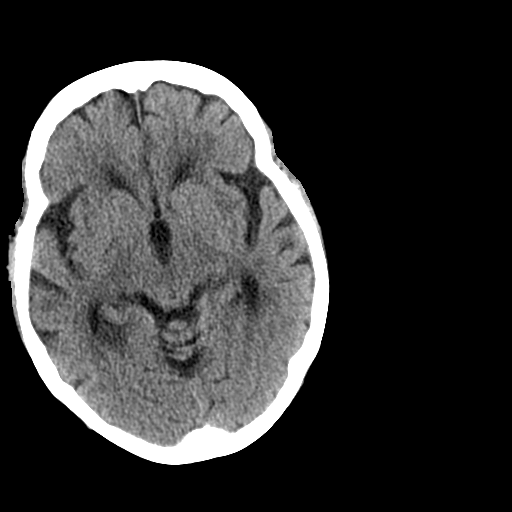
[im 16/28  brain]
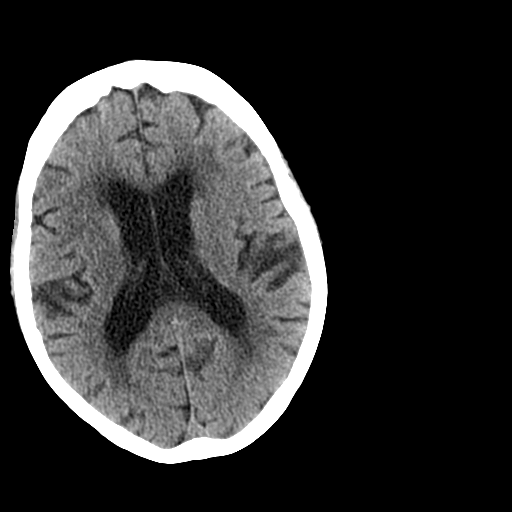
[im 16/28  bone]
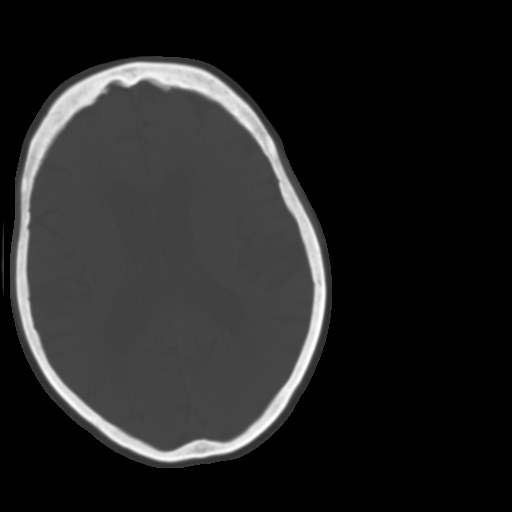
[im 19/28  brain]
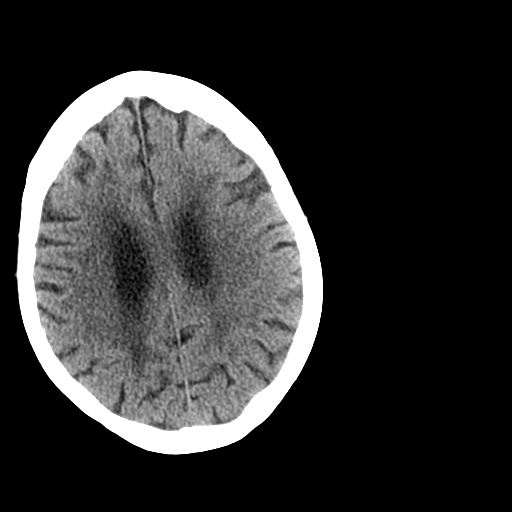
[im 21/28  brain]
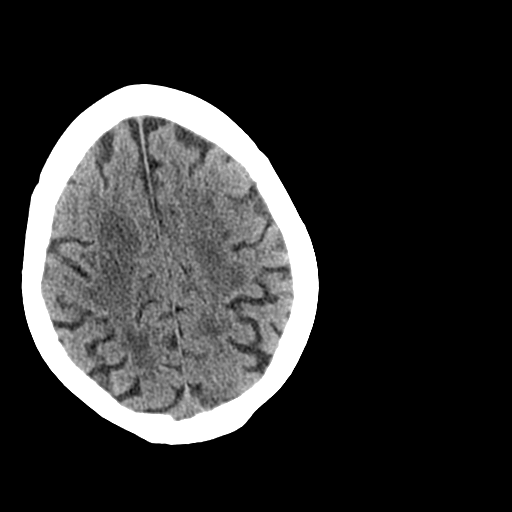
[im 25/28  brain]
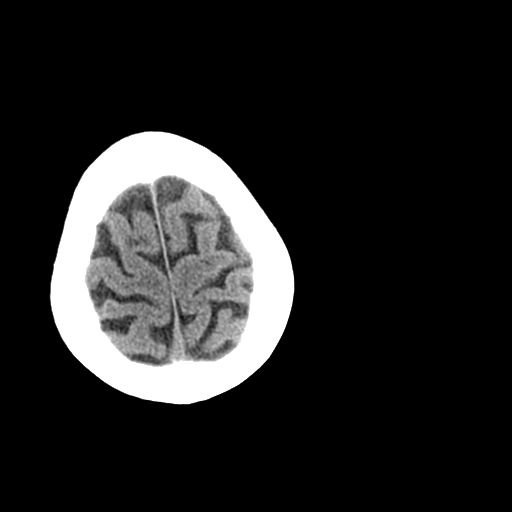

[Series 4: coronal · coronal · 0.26mm/px · 3 of 77 slices shown]
[im 26/77  brain]
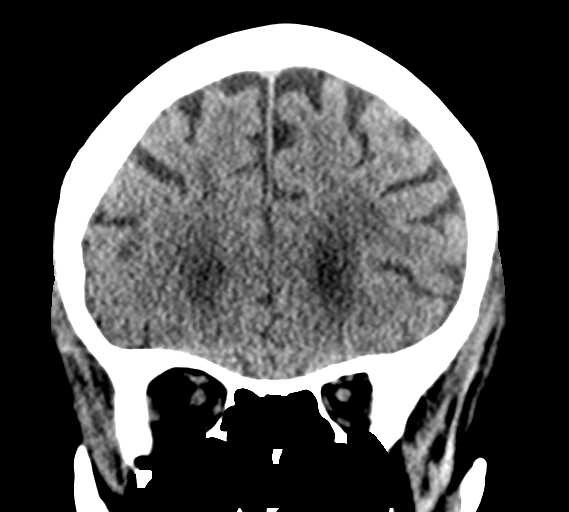
[im 34/77  brain]
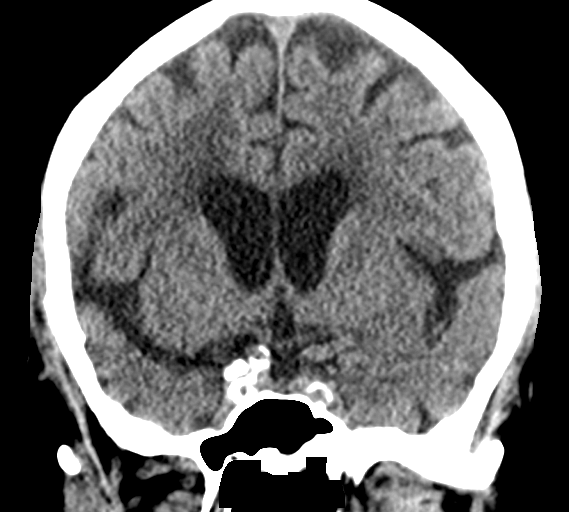
[im 43/77  brain]
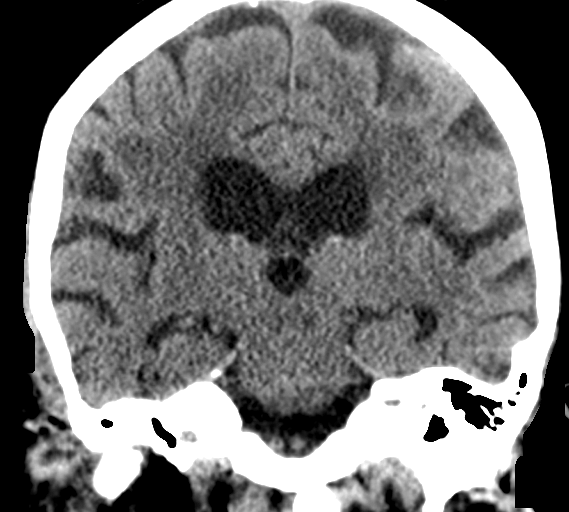

[Series 5: sagittal · sagittal · 0.26mm/px · 3 of 63 slices shown]
[im 21/63  brain]
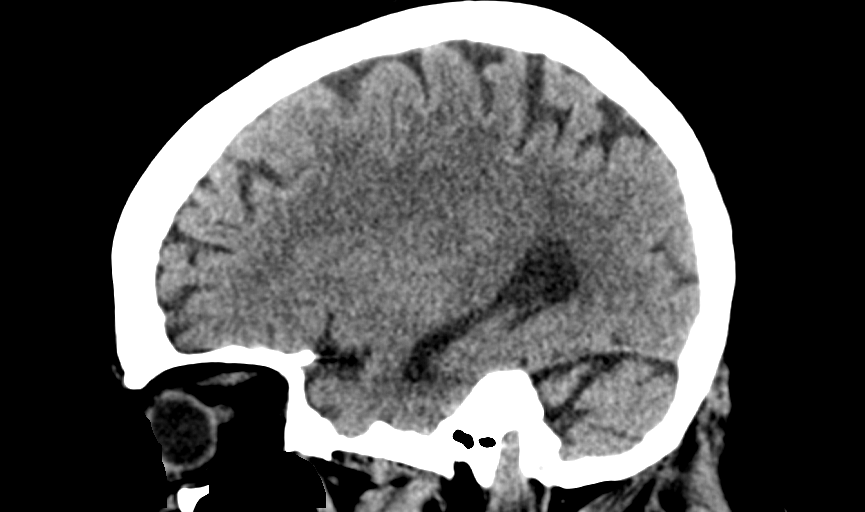
[im 32/63  brain]
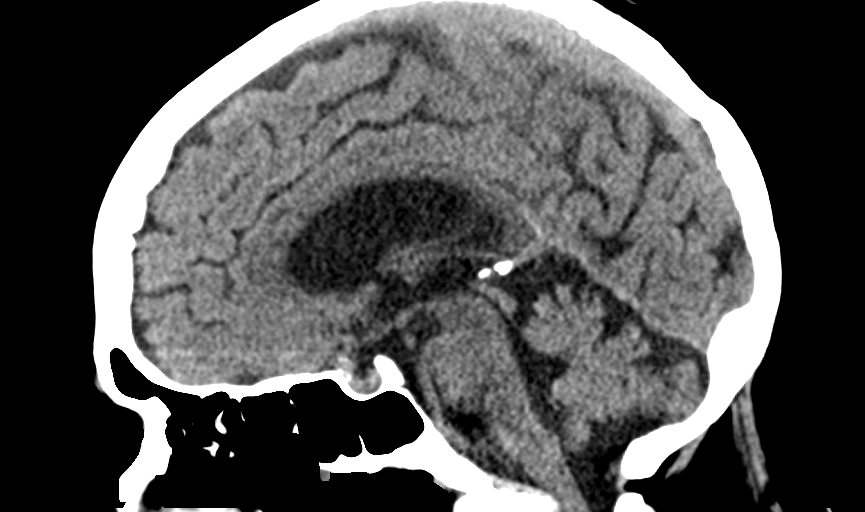
[im 42/63  brain]
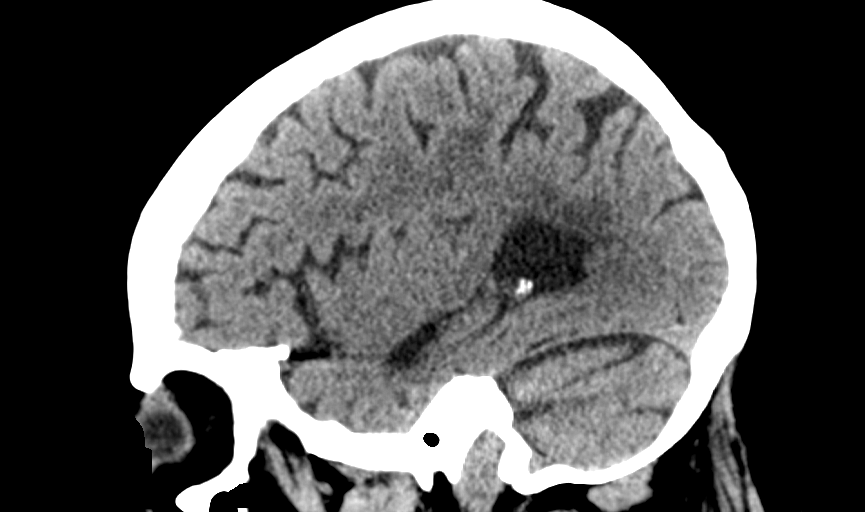

[14 of 47 positions shown; findings below may reference images not displayed]

FINDINGS: Brain: Diffusely enlarged ventricles and subarachnoid spaces. Patchy
white matter low density in both cerebral hemispheres. No
intracranial hemorrhage, mass lesion or CT evidence of acute
infarction.

Vascular: No hyperdense vessel or unexpected calcification.

Skull: Normal. Negative for fracture or focal lesion.

Sinuses/Orbits: Status post bilateral cataract extraction.
Unremarkable paranasal sinuses.

Other: None.
IMPRESSION: 1. No acute abnormality.
2. Stable diffuse cerebral and cerebellar atrophy and chronic small
vessel white matter ischemic changes in both cerebral hemispheres.
